# Patient Record
Sex: Female | Born: 1950 | Race: Black or African American | Hispanic: No | Marital: Married | Smoking: Never smoker
Health system: Southern US, Community
[De-identification: ages and names within clinical notes are randomized; demographics above are authoritative.]

## PROBLEM LIST (undated history)

## (undated) ENCOUNTER — Inpatient Hospital Stay: Admission: EM | Payer: Self-pay | Source: Home / Self Care

## (undated) DIAGNOSIS — R002 Palpitations: Secondary | ICD-10-CM

## (undated) DIAGNOSIS — M109 Gout, unspecified: Secondary | ICD-10-CM

## (undated) DIAGNOSIS — G4700 Insomnia, unspecified: Secondary | ICD-10-CM

## (undated) DIAGNOSIS — N189 Chronic kidney disease, unspecified: Secondary | ICD-10-CM

## (undated) DIAGNOSIS — E785 Hyperlipidemia, unspecified: Secondary | ICD-10-CM

## (undated) DIAGNOSIS — I10 Essential (primary) hypertension: Secondary | ICD-10-CM

## (undated) DIAGNOSIS — T7840XA Allergy, unspecified, initial encounter: Secondary | ICD-10-CM

## (undated) DIAGNOSIS — G4730 Sleep apnea, unspecified: Secondary | ICD-10-CM

## (undated) DIAGNOSIS — F32A Depression, unspecified: Secondary | ICD-10-CM

## (undated) DIAGNOSIS — M797 Fibromyalgia: Secondary | ICD-10-CM

## (undated) DIAGNOSIS — M1990 Unspecified osteoarthritis, unspecified site: Secondary | ICD-10-CM

## (undated) HISTORY — DX: Unspecified osteoarthritis, unspecified site: M19.90

## (undated) HISTORY — DX: Morbid (severe) obesity due to excess calories: E66.01

## (undated) HISTORY — PX: OTHER SURGICAL HISTORY: SHX169

## (undated) HISTORY — PX: ROTATOR CUFF REPAIR: SHX139

## (undated) HISTORY — DX: Hyperlipidemia, unspecified: E78.5

## (undated) HISTORY — PX: KNEE ARTHROSCOPY: SUR90

## (undated) HISTORY — DX: Allergy, unspecified, initial encounter: T78.40XA

## (undated) HISTORY — DX: Chronic kidney disease, unspecified: N18.9

## (undated) HISTORY — DX: Gout, unspecified: M10.9

## (undated) HISTORY — DX: Insomnia, unspecified: G47.00

## (undated) HISTORY — DX: Fibromyalgia: M79.7

## (undated) HISTORY — PX: TUBAL LIGATION: SHX77

## (undated) HISTORY — DX: Depression, unspecified: F32.A

## (undated) HISTORY — DX: Essential (primary) hypertension: I10

## (undated) HISTORY — DX: Palpitations: R00.2

## (undated) HISTORY — PX: JOINT REPLACEMENT: SHX530

---

## 2012-11-12 ENCOUNTER — Other Ambulatory Visit: Payer: Self-pay | Admitting: Internal Medicine

## 2012-11-12 ENCOUNTER — Ambulatory Visit (HOSPITAL_COMMUNITY)
Admission: RE | Admit: 2012-11-12 | Discharge: 2012-11-12 | Disposition: A | Discharge status: Home / Self Care | Payer: BC Managed Care – PPO | Source: Ambulatory Visit | Attending: Internal Medicine | Admitting: Internal Medicine

## 2012-11-12 DIAGNOSIS — M25512 Pain in left shoulder: Secondary | ICD-10-CM

## 2012-11-12 DIAGNOSIS — M25519 Pain in unspecified shoulder: Secondary | ICD-10-CM | POA: Insufficient documentation

## 2012-11-12 DIAGNOSIS — M47812 Spondylosis without myelopathy or radiculopathy, cervical region: Secondary | ICD-10-CM | POA: Insufficient documentation

## 2012-11-12 DIAGNOSIS — I658 Occlusion and stenosis of other precerebral arteries: Secondary | ICD-10-CM | POA: Insufficient documentation

## 2012-11-12 DIAGNOSIS — I6529 Occlusion and stenosis of unspecified carotid artery: Secondary | ICD-10-CM | POA: Insufficient documentation
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
## 2014-04-22 DIAGNOSIS — J3089 Other allergic rhinitis: Secondary | ICD-10-CM | POA: Insufficient documentation

## 2014-04-22 DIAGNOSIS — R002 Palpitations: Secondary | ICD-10-CM

## 2014-04-22 DIAGNOSIS — E876 Hypokalemia: Secondary | ICD-10-CM | POA: Insufficient documentation

## 2014-04-22 HISTORY — DX: Palpitations: R00.2

## 2014-04-23 ENCOUNTER — Telehealth: Payer: Self-pay | Admitting: Cardiology

## 2014-04-23 NOTE — Telephone Encounter (Signed)
Received records from [facility] for appointment on 05/17/14 with Dr Percival Spanish.  Records given to [facility] (medical records) for Dr Hochrein's schedule on 05/17/14. lp

## 2014-05-17 ENCOUNTER — Ambulatory Visit (INDEPENDENT_AMBULATORY_CARE_PROVIDER_SITE_OTHER): Payer: BC Managed Care – PPO | Admitting: Cardiology

## 2014-05-17 ENCOUNTER — Encounter: Payer: Self-pay | Admitting: Cardiology

## 2014-05-17 VITALS — BP 146/78 | HR 64 | Ht 62.0 in | Wt 208.0 lb

## 2014-05-17 DIAGNOSIS — R0789 Other chest pain: Secondary | ICD-10-CM

## 2014-05-17 MED ORDER — DILTIAZEM HCL ER COATED BEADS 240 MG PO CP24: 240.0000 mg | ORAL_CAPSULE | Freq: Every day | ORAL | Status: DC

## 2014-05-17 NOTE — Patient Instructions (Addendum)
DECREASE your Cardizem to 240 mg daily. You can finish your Rx that you just purchased then switch.  Your physician recommends that you schedule a follow-up appointment in: as needed with Dr. Percival Spanish

## 2014-05-17 NOTE — Progress Notes (Signed)
Cardiology Office Note   Date:  05/17/2014   ID:  Amy Hooper, DOB [birth year], MRN [id number]  PCP:  No primary care provider on file.  Cardiologist:   Minus Breeding, MD   Chief Complaint  Patient presents with  . Palpitations      History of Present Illness: Amy Hooper is a 64 y.o. female who presents for presents for evaluation of palpitations.  She has a history of these and was treated with Cardizem.  However, at that time there was much stress in her life.  Over the years this has improved.  She does not get these palpitations any longer.  The patient denies any new symptoms such as chest discomfort, neck or arm discomfort. There has been no new shortness of breath, PND or orthopnea. There have been no reported palpitations, presyncope or syncope.  She had one episode of chest discomfort that she thought was probably related to eating something recently. She comes today because she wants to come off of her medications if possible.   Past Medical History  Diagnosis Date  . Osteoarthritis   . HTN (hypertension)   . Palpitation     Past Surgical History  Procedure Laterality Date  . Rotator cuff repair Left   . Tubal ligation      No current outpatient prescriptions on file.   No current facility-administered medications for this visit.    Allergies:   Review of patient's allergies indicates not on file.    Social History:  The patient  reports that she has never smoked. She does not have any smokeless tobacco history on file.   Family History:  The patient's family history includes Hyperlipidemia in her other; Hypothyroidism in her mother; Kidney failure in her father.    ROS:  Please see the history of present illness.   Otherwise, review of systems are positive for positive for reflux..   All other systems are reviewed and negative.    PHYSICAL EXAM: VS:  BP 146/78 mmHg  Pulse 64  Ht 5\' 2"  (1.575 m)  Wt 208 lb (94.348 kg)  BMI 38.03 kg/m2 , BMI  Body mass index is 38.03 kg/(m^2). GENERAL:  Well appearing HEENT:  Pupils equal round and reactive, fundi not visualized, oral mucosa unremarkable NECK:  No jugular venous distention, waveform within normal limits, carotid upstroke brisk and symmetric, no bruits, no thyromegaly LYMPHATICS:  No cervical, inguinal adenopathy LUNGS:  Clear to auscultation bilaterally BACK:  No CVA tenderness CHEST:  Unremarkable HEART:  PMI not displaced or sustained,S1 and S2 within normal limits, no S3, no S4, no clicks, no rubs, no murmurs ABD:  Flat, positive bowel sounds normal in frequency in pitch, no bruits, no rebound, no guarding, no midline pulsatile mass, no hepatomegaly, no splenomegaly EXT:  2 plus pulses throughout, no edema, no cyanosis no clubbing SKIN:  No rashes no nodules NEURO:  Cranial nerves II through XII grossly intact, motor grossly intact throughout PSYCH:  Cognitively intact, oriented to person place and time    EKG:  EKG is ordered today. The ekg ordered today demonstrates sinus rhythm, rate 64, axis within normal limits, intervals within normal limits, premature atrial contraction, no acute ST-T wave changes.   Recent Labs: No results found for requested labs within last 365 days.    Lipid Panel No results found for: CHOL, TRIG, HDL, CHOLHDL, VLDL, LDLCALC, LDLDIRECT    Wt Readings from Last 3 Encounters:  05/17/14 208 lb (94.348 kg)  Other studies Reviewed: Additional studies/ records that were reviewed today include: Outside office records. Review of the above records demonstrates:  Please see elsewhere in the note.     ASSESSMENT AND PLAN:  PALPITATION: She does not need to take the Cardizem for palpitations. However, she probably does need this for blood pressure. I'm going to reduce to 240 mg her dose. However, her blood pressure will be handled as below.  HTN:  She needs to keep an eye on her blood pressure as we reduce the Cardizem dose. She might  eventually come off of the medications if she can increase her walking, lose weight and reduce her salt.   Current medicines are reviewed at length with the patient today.  The patient does not have concerns regarding medicines.  The following changes have been made:  no change  Labs/ tests ordered today include:   Orders Placed This Encounter  Procedures  . EKG 12-Lead     Disposition:   FU with me as needed.     Signed, Minus Breeding, MD  05/17/2014 8:38 AM    [facility]

## 2017-12-05 ENCOUNTER — Ambulatory Visit
Admission: RE | Admit: 2017-12-05 | Discharge: 2017-12-05 | Disposition: A | Discharge status: Home / Self Care | Payer: BLUE CROSS/BLUE SHIELD | Source: Ambulatory Visit | Attending: Physician Assistant | Admitting: Physician Assistant

## 2017-12-05 ENCOUNTER — Other Ambulatory Visit: Payer: Self-pay | Admitting: Physician Assistant

## 2017-12-05 DIAGNOSIS — M25511 Pain in right shoulder: Secondary | ICD-10-CM

## 2017-12-05 IMAGING — CR DG SHOULDER 2+V*R*
3 series · 3 of 3 positions shown · non-contrast
Comparison: None

CLINICAL DATA: Right shoulder pain for 6 months.

EXAM:
RIGHT SHOULDER - 2+ VIEW

[w shoulder grashey right]
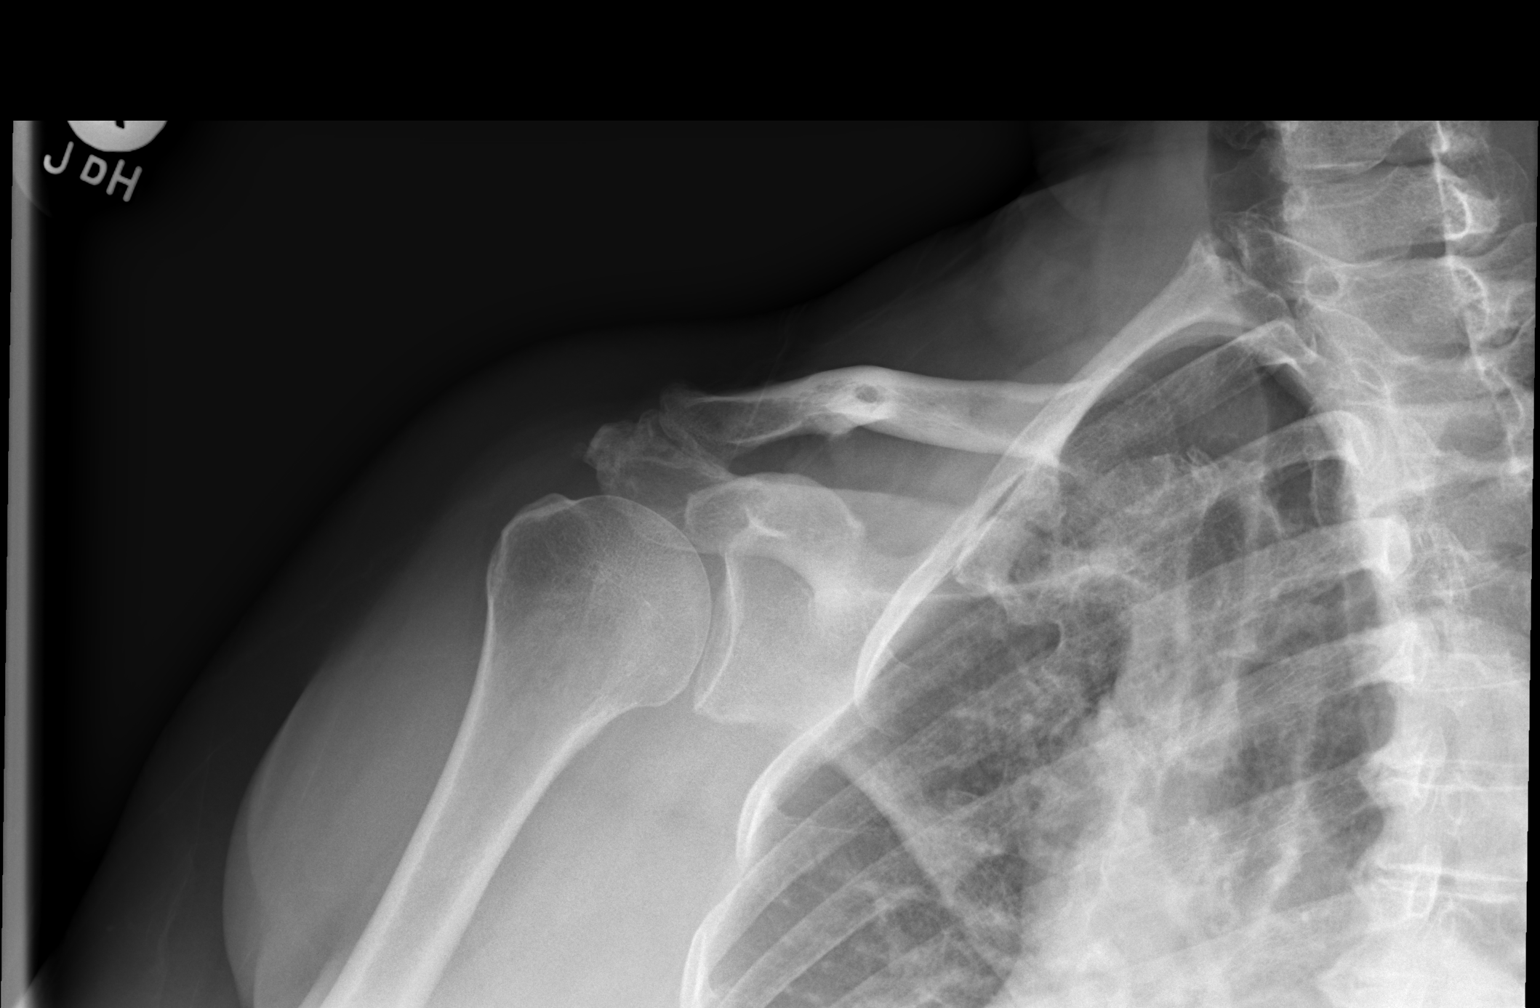

[w shoulder y-view right]
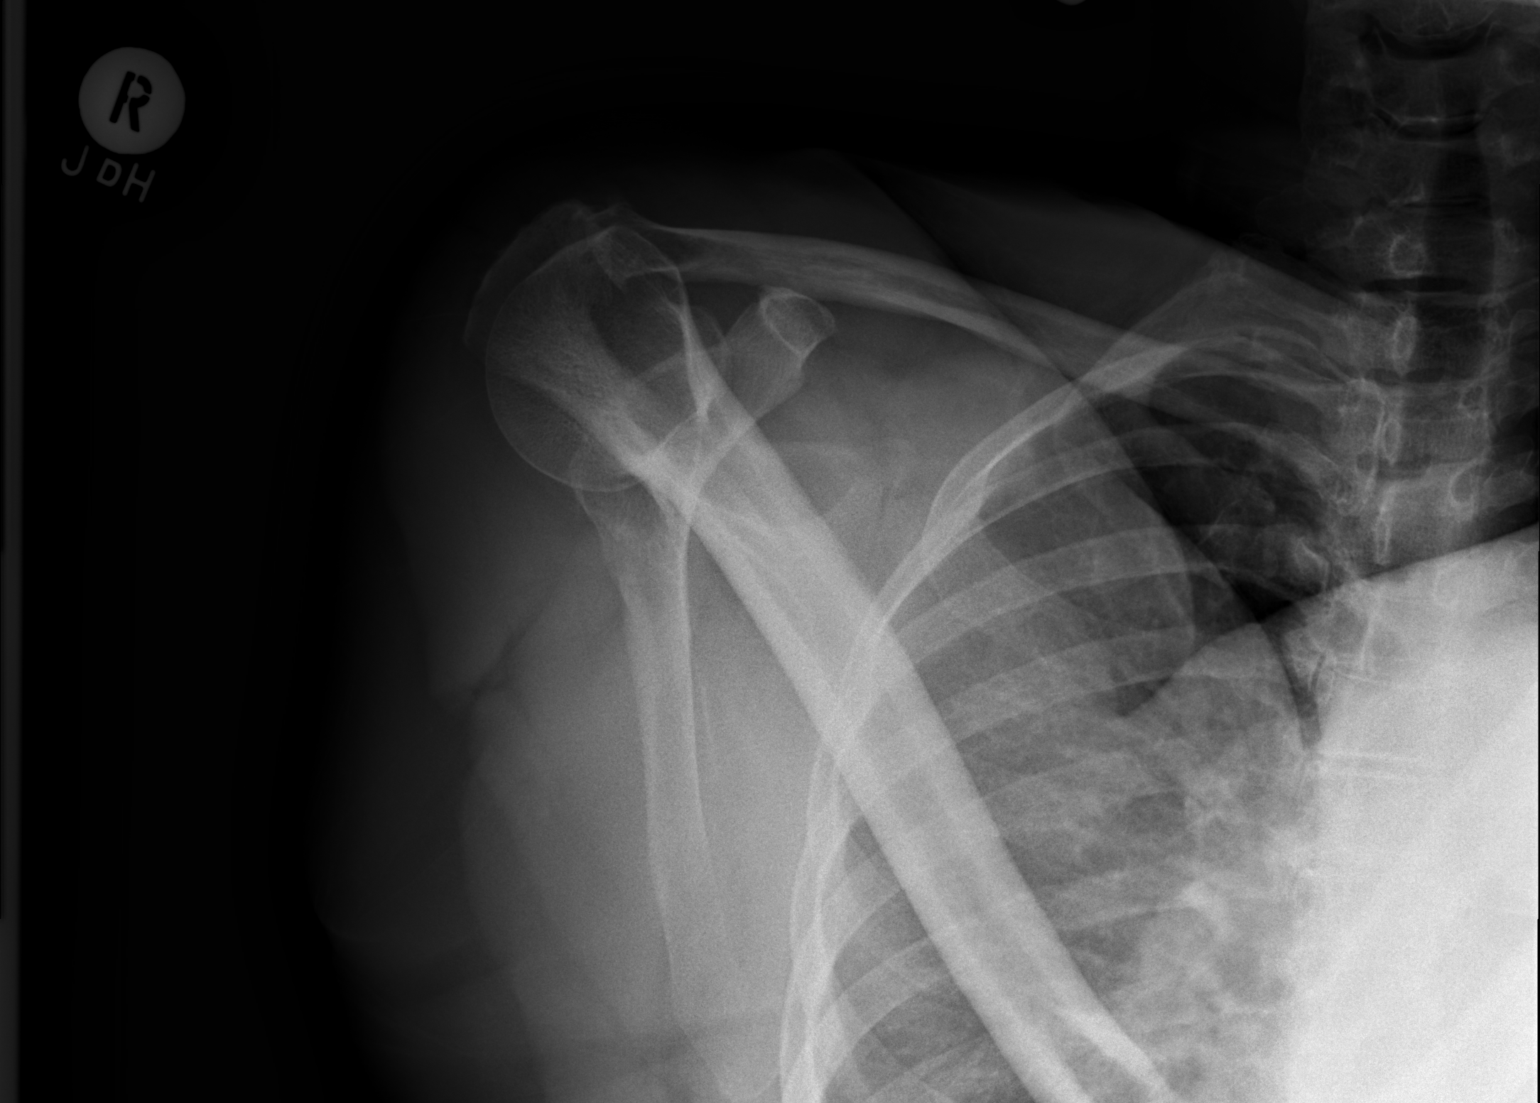

[w shoulder axillary right]
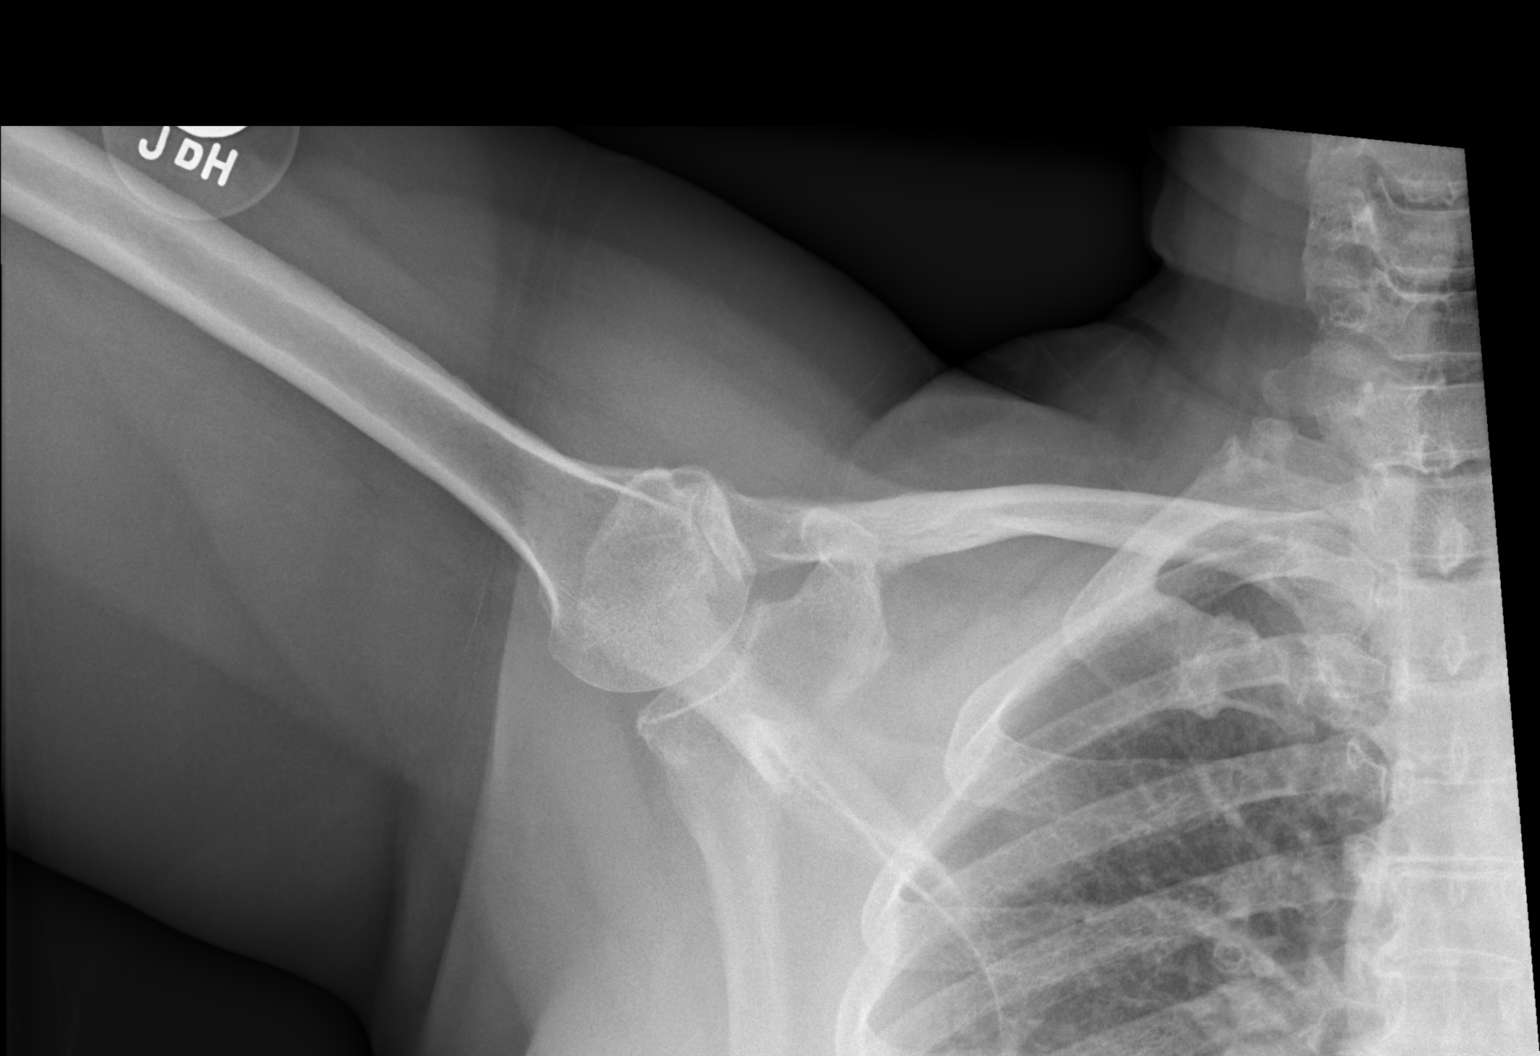

[3 of 3 positions shown; findings below may reference images not displayed]

FINDINGS: There are marked degenerative changes involving the
acromioclavicular joint. The glenohumeral joint appears normal. No
fractures or dislocations identified.
IMPRESSION: 1. AC joint osteoarthritis.

## 2018-12-27 ENCOUNTER — Ambulatory Visit
Admission: RE | Admit: 2018-12-27 | Discharge: 2018-12-27 | Disposition: A | Discharge status: Home / Self Care | Payer: BC Managed Care – PPO | Source: Ambulatory Visit | Attending: Physician Assistant | Admitting: Physician Assistant

## 2018-12-27 ENCOUNTER — Other Ambulatory Visit: Payer: Self-pay | Admitting: Physician Assistant

## 2018-12-27 DIAGNOSIS — M79662 Pain in left lower leg: Secondary | ICD-10-CM

## 2018-12-27 IMAGING — CR DG TIBIA/FIBULA 2V*L*
4 series · 4 of 4 positions shown · non-contrast
Comparison: None.

CLINICAL DATA: Left shin pain, no known injury

EXAM:
LEFT TIBIA AND FIBULA - 2 VIEW

[x tib-fib ap left (1 of 2)]
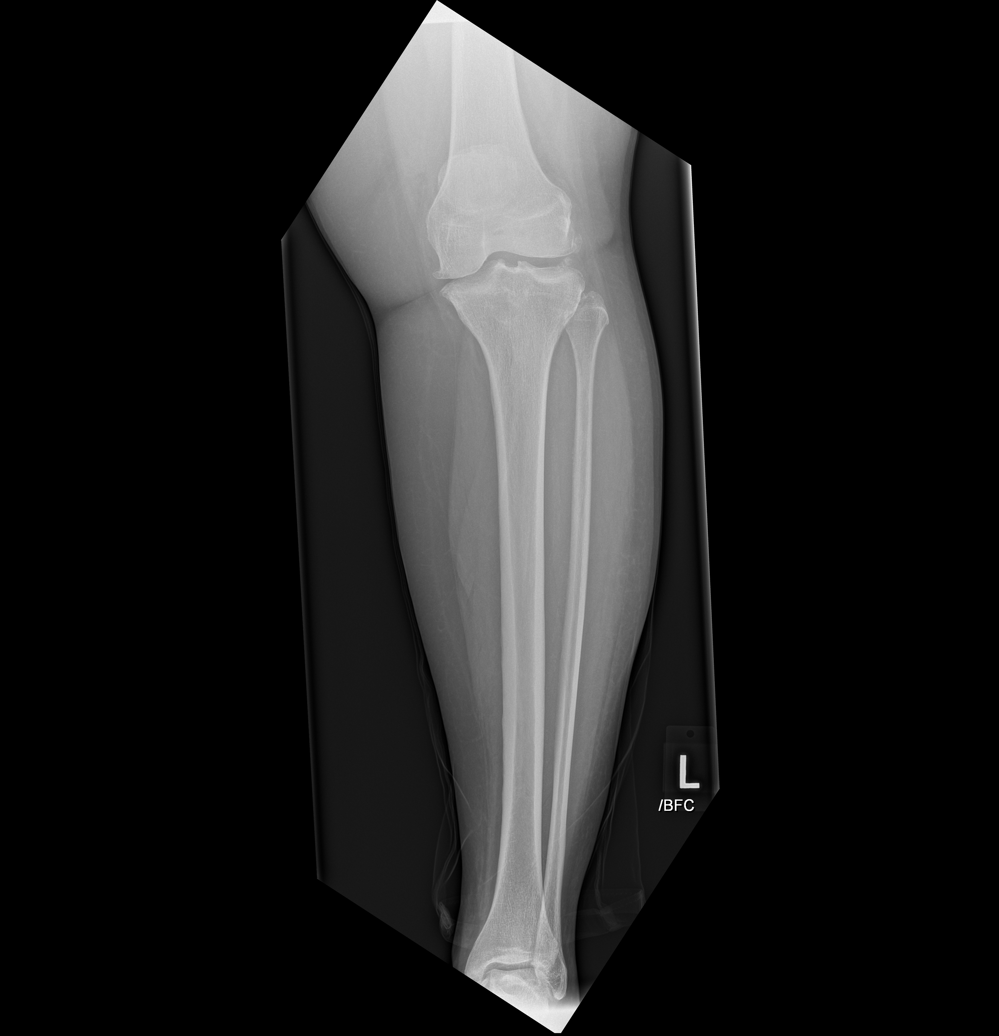

[x tib-fib ap left (2 of 2)]
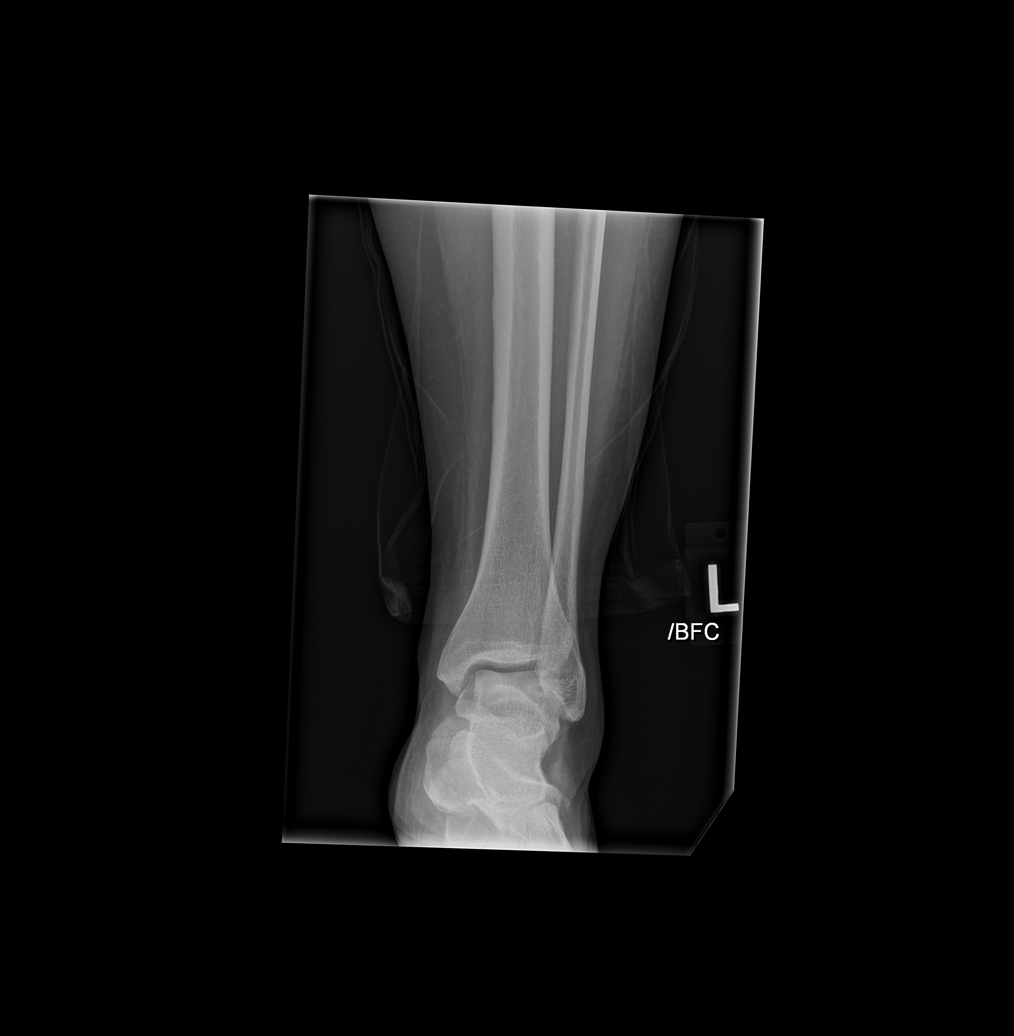

[x tib-fib lat left (1 of 2)]
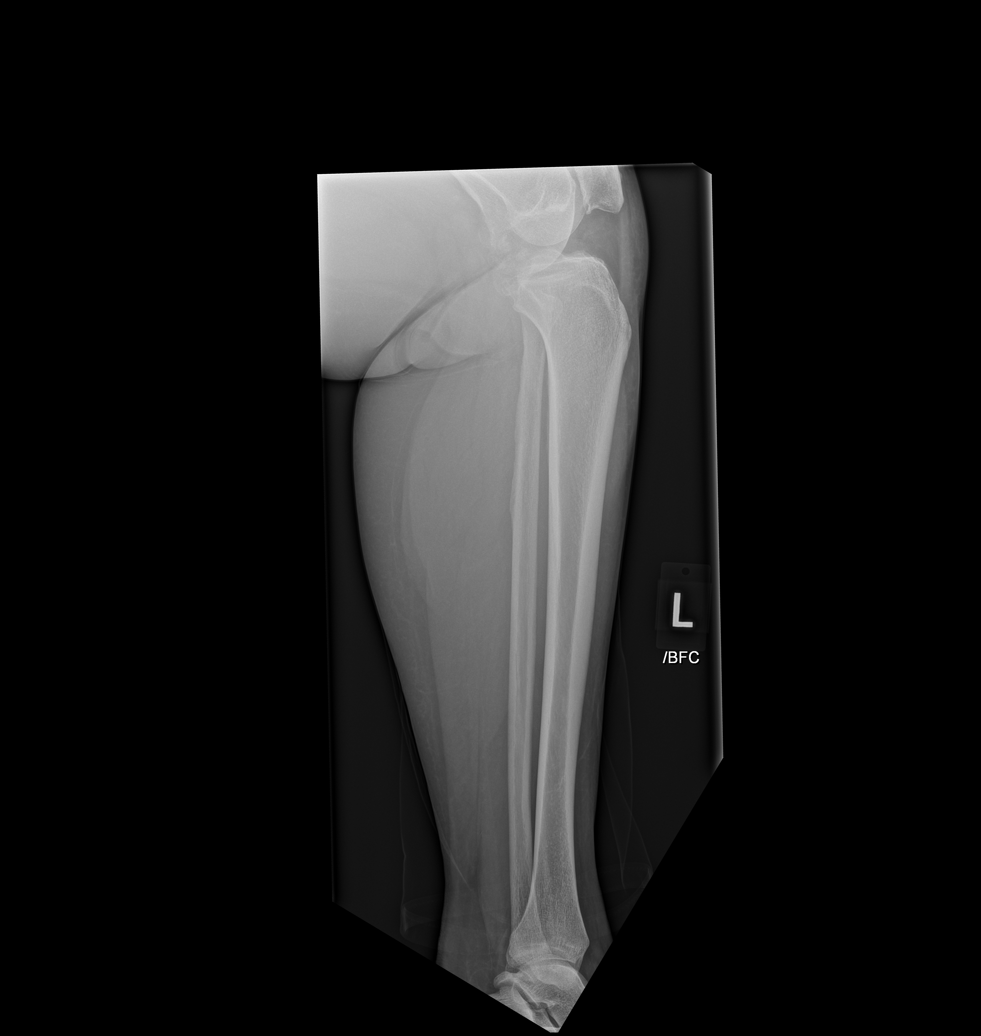

[x tib-fib lat left (2 of 2)]
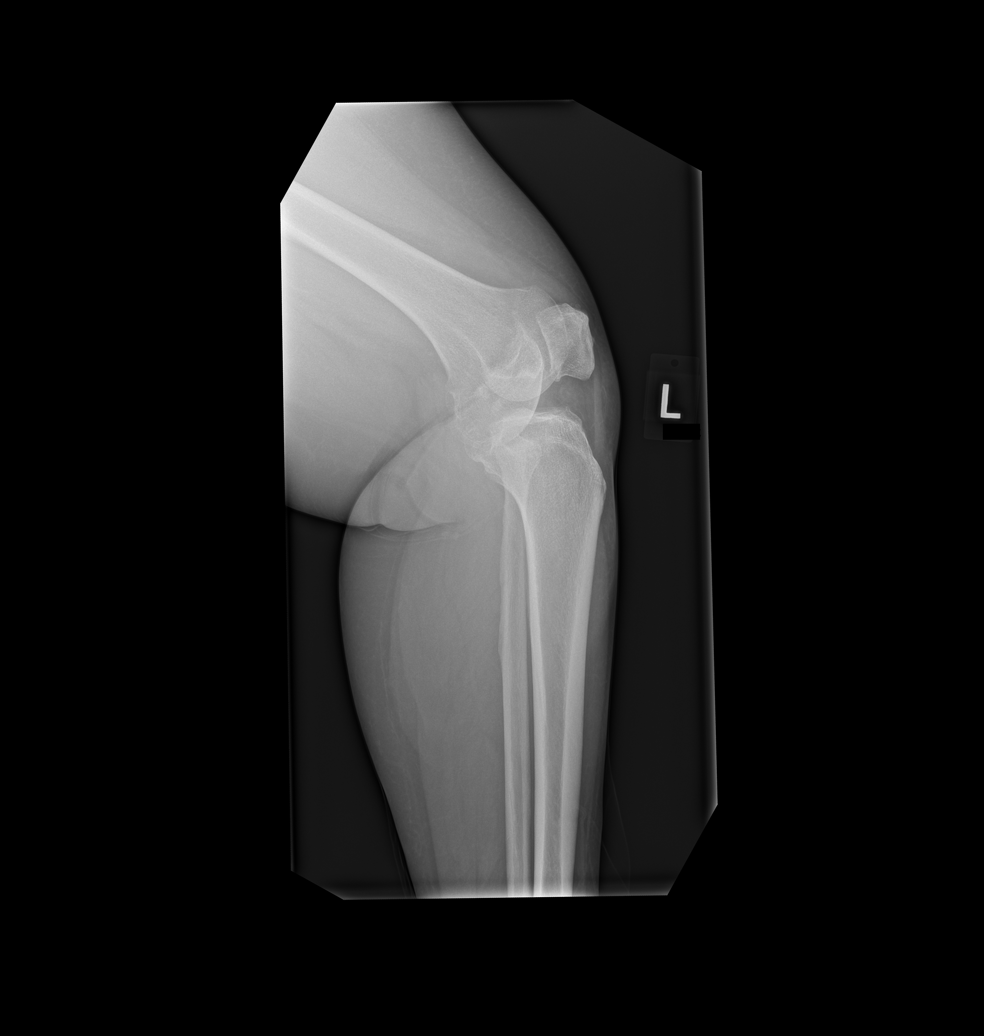

[4 of 4 positions shown; findings below may reference images not displayed]

FINDINGS: No fracture or dislocation of the left tibia or fibula. There is
moderate arthrosis of the partially included left knee. The ankle is
unremarkable. Soft tissues are unremarkable.
IMPRESSION: No fracture or dislocation of the left tibia or fibula. No
radiographic findings to explain pain.

## 2019-09-25 ENCOUNTER — Encounter: Payer: Self-pay | Admitting: Rehabilitative and Restorative Service Providers"

## 2019-09-25 ENCOUNTER — Other Ambulatory Visit: Payer: Self-pay

## 2019-09-25 ENCOUNTER — Ambulatory Visit: Payer: No Typology Code available for payment source | Admitting: Rehabilitative and Restorative Service Providers"

## 2019-09-25 DIAGNOSIS — R293 Abnormal posture: Secondary | ICD-10-CM

## 2019-09-25 DIAGNOSIS — R600 Localized edema: Secondary | ICD-10-CM

## 2019-09-25 DIAGNOSIS — G8929 Other chronic pain: Secondary | ICD-10-CM

## 2019-09-25 DIAGNOSIS — M6281 Muscle weakness (generalized): Secondary | ICD-10-CM

## 2019-09-25 DIAGNOSIS — M25511 Pain in right shoulder: Secondary | ICD-10-CM | POA: Diagnosis not present

## 2019-09-25 NOTE — Patient Instructions (Signed)
Access Code: [email or web address] URL: [email or web address] Date: 09/25/2019 Prepared by: Scot Jun  Exercises Supine Shoulder Flexion Extension AAROM with Dowel - 2 x daily - 7 x weekly - 10 reps - 3 sets - 5 hold Supine Shoulder External Rotation with Dowel - 2 x daily - 7 x weekly - 3 sets - 10 reps - 5 hold Seated Scapular Retraction - 2 x daily - 7 x weekly - 10 reps - 2 sets - 5 hold

## 2019-09-25 NOTE — Therapy (Signed)
Amy Hooper, [place], [address] Phone: [phone]   Fax:  [phone]  Physical Therapy Evaluation  Patient Details  Name: Amy Hooper MRN: [id number] Date of Birth: [birth year] Referring Provider (PT): Dr. Berenice Primas   Encounter Date: 09/25/2019   PT End of Session - 09/25/19 0932    Visit Number 1    Number of Visits 12    Date for PT Re-Evaluation 11/06/19    Progress Note Due on Visit 10    PT Start Time 0933    PT Stop Time 1013    PT Time Calculation (min) 40 min    Activity Tolerance Patient tolerated treatment well    Behavior During Therapy [facility] for tasks assessed/performed           Past Medical History:  Diagnosis Date  . HTN (hypertension)   . Osteoarthritis   . Palpitation     Past Surgical History:  Procedure Laterality Date  . ROTATOR CUFF REPAIR Left   . TUBAL LIGATION      There were no vitals filed for this visit.    Subjective Assessment - 09/25/19 0938    Subjective Amy Hooper is s/p Rt shoulder arthroscopy.  Pt. indicated symptoms had insidious onset that led her to seek medical care.  Initially had steriod orally last year with some improvement but symptoms returned worse.  Saw Dr. Berenice Primas, had injection, then had MRI performed.  Arthrscopic surgery was performed Aug 15, 2019.  Pt. indicated physical therapy was performed in June at another clinic prior to insurance change recently.    Pertinent History History of Lt shoulder surgery several years ago.    Diagnostic tests MRI    Patient Stated Goals Reduce pain    Currently in Pain? Yes    Pain Score 6    at worst   Pain Location Shoulder    Pain Orientation Right   anterior, superior Rt shoulder   Pain Descriptors / Indicators Aching;Tightness    Pain Type Surgical pain;Chronic pain    Pain Onset More than a month ago    Pain Frequency Intermittent    Aggravating Factors  reaching, end range movements, night time pain    Pain  Relieving Factors rest, wearing sling, ice    Effect of Pain on Daily Activities Overhead reaching, lifting, carrying              [facility] PT Assessment - 09/25/19 0001      Assessment   Medical Diagnosis S/P Rt shoulder arthroscopy    Referring Provider (PT) Dr. Berenice Primas    Onset Date/Surgical Date 08/15/19    Hand Dominance Right      Restrictions   Weight Bearing Restrictions No      Balance Screen   Has the patient fallen in the past 6 months No    Has the patient had a decrease in activity level because of a fear of falling?  Yes   2/2 symptoms   Is the patient reluctant to leave their home because of a fear of falling?  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  will assess in future    AROM Assessment Site Shoulder    Right/Left Shoulder Left;Right      PROM   PROM Assessment Site Shoulder    Right/Left Shoulder Left;Right    Right Shoulder Flexion 120 Degrees    Right Shoulder ABduction 110 Degrees    Right Shoulder Internal Rotation 60 Degrees   measured in 45 deg abd in supine   Right Shoulder External Rotation 60 Degrees   measured in 45 deg abd in supine     Strength   Strength Assessment Site Shoulder;Elbow    Right/Left Shoulder Left;Right    Left Shoulder Flexion 5/5    Left Shoulder ABduction 5/5      Left Shoulder Internal Rotation 5/5    Left Shoulder External Rotation 5/5    Right/Left Elbow Left;Right    Right Elbow Flexion 5/5    Right Elbow Extension 5/5    Left Elbow Flexion 5/5    Left Elbow Extension 5/5      Palpation   Palpation comment Anterior jt line tightness noted Rt GH jt                      Objective measurements completed on examination: See above findings.       [facility] Adult PT Treatment/Exercise - 09/25/19 0001      Exercises   Exercises Other Exercises    Other Exercises  HEP instruction/performance c cues for techniques consisting of supine wand flexion , supine wand er (performed 15 x each), scapular retraction x 10, pendulum review      Manual Therapy   Manual therapy comments g2-g3 inferior Rt gh Jt mobs, prom                  PT Education - 09/25/19 0932    Education Details HEP, POC    Person(s) Educated Patient    Methods Explanation;Verbal cues;Handout    Comprehension Returned demonstration;Verbalized understanding               PT Long Term Goals - 09/25/19 1016      PT LONG TERM GOAL #1   Title Patient will demonstrate independent use of home exercise program to facilitate ability to maintain/progress functional gains from skilled physical therapy services.    Status New    Target Date 11/06/19      PT LONG TERM GOAL #2   Title Patient will demonstrate/report pain at worst less than or equal to 2/10 to facilitate minimal limitation in daily activity secondary to pain symptoms.    Status New    Target Date 11/06/19      PT LONG TERM GOAL #3   Title Patient will demonstrate Rt [place] joint mobility WFL to facilitate usual self care, dressing, reaching overhead at PLOF s limitation due to symptoms.    Status New    Target Date 11/06/19      PT LONG TERM GOAL #4   Title Patient will demonstrate Rt UE MMT 5/5 throughout to facilitate usual lifting, carrying in functional activity to PLOF s limitation.  Target Date 11/06/19      PT LONG TERM GOAL #5   Title Pt. will demonstrate postural s deviation to facilitate reduced strain on Rt shoulder during daily work activity.    Target Date 11/06/19                  Plan - 09/25/19 1001    Clinical Impression Statement Patient is a 69 y.o. female who comes to clinic with complaints of Rt shoulder pain with mobility, strength and movement coordination deficits that impair their ability to perform usual daily and recreational functional activities without increase difficulty/symptoms at this time.  Patient to benefit from skilled PT services to address impairments and limitations to improve to previous level of function without restriction secondary to condition.    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)   Stability/Clinical Decision Making Stable/Uncomplicated    Clinical Decision Making Low    Rehab Potential Good    PT Frequency 2x / week    PT Duration 6 weeks    PT Treatment/Interventions ADLs/Self Care Home Management;Electrical Stimulation;Iontophoresis 4mg /ml Dexamethasone;Moist Heat;Traction;Balance training;Therapeutic exercise;Therapeutic activities;Functional mobility training;Gait training;Ultrasound;Neuromuscular re-education;Patient/family education;Manual techniques;Vasopneumatic Device;Taping;Passive range of motion;Dry needling;Spinal Manipulations;Joint Manipulations    PT Next Visit Plan Reassess HEP, progress mobility as tolerated and initiate strengthening as appropriate by surgical protocol (6 weeks tomorrow).    PT Home Exercise Plan [email or web address]    Consulted and Agree with Plan of Care Patient           Patient will benefit from skilled therapeutic intervention in order to improve the following  deficits and impairments:  Decreased endurance, Hypomobility, Increased edema, Decreased activity tolerance, Decreased strength, Impaired UE functional use, Pain, Decreased mobility, Decreased range of motion, Postural dysfunction, Impaired flexibility, Decreased coordination  Visit Diagnosis: Chronic right shoulder pain  Muscle weakness (generalized)  Abnormal posture  Localized edema     Problem List There are no problems to display for this patient.   Scot Jun, PT, DPT, OCS, ATC 09/25/19  10:19 AM     [facility] [address] [place], [place], [address] Phone: [phone]   Fax:  [phone]  Name: Amy Hooper MRN: [id number] Date of Birth: 29-Nov-1950

## 2019-10-03 ENCOUNTER — Ambulatory Visit (INDEPENDENT_AMBULATORY_CARE_PROVIDER_SITE_OTHER): Payer: No Typology Code available for payment source | Admitting: Physical Therapy

## 2019-10-03 ENCOUNTER — Other Ambulatory Visit: Payer: Self-pay

## 2019-10-03 ENCOUNTER — Encounter: Payer: Self-pay | Admitting: Physical Therapy

## 2019-10-03 DIAGNOSIS — R600 Localized edema: Secondary | ICD-10-CM

## 2019-10-03 DIAGNOSIS — M25511 Pain in right shoulder: Secondary | ICD-10-CM

## 2019-10-03 DIAGNOSIS — M6281 Muscle weakness (generalized): Secondary | ICD-10-CM | POA: Diagnosis not present

## 2019-10-03 DIAGNOSIS — R293 Abnormal posture: Secondary | ICD-10-CM | POA: Diagnosis not present

## 2019-10-03 DIAGNOSIS — G8929 Other chronic pain: Secondary | ICD-10-CM

## 2019-10-03 NOTE — Therapy (Signed)
[place], [place], [address] Phone: [phone]   Fax:  [phone]  Physical Therapy Treatment  Patient Details  Name: Amy Hooper MRN: [id number] Date of Birth: 01/02/1951 Referring Provider (PT): Dr. Berenice Primas   Encounter Date: 10/03/2019   PT End of Session - 10/03/19 1001    Visit Number 2    Number of Visits 12    Date for PT Re-Evaluation 11/06/19    Progress Note Due on Visit 10    PT Start Time 0930    PT Stop Time 1025    PT Time Calculation (min) 55 min    Activity Tolerance Patient tolerated treatment well    Behavior During Therapy [facility] for tasks assessed/performed           Past Medical History:  Diagnosis Date  . HTN (hypertension)   . Osteoarthritis   . Palpitation     Past Surgical History:  Procedure Laterality Date  . ROTATOR CUFF REPAIR Left   . TUBAL LIGATION      There were no vitals filed for this visit.   Subjective Assessment - 10/03/19 0939    Subjective relays about 8/10 pain today in her Rt shoulder but she has also discontinued her pain medicine getting off the narcotics and swithing over to OTC    Pertinent History History of Lt shoulder surgery several years ago.    Diagnostic tests MRI    Patient Stated Goals Reduce pain    Pain Onset More than a month ago              [facility] PT Assessment - 10/03/19 0001      Assessment   Medical Diagnosis S/P Rt shoulder arthroscopy, biceps tenodesis    Referring Provider (PT) Dr. Berenice Primas    Onset Date/Surgical Date 08/15/19    Hand Dominance Right    Next MD Visit 10/09/19                         [facility] Adult PT Treatment/Exercise - 10/03/19 0001      Exercises   Exercises Shoulder      Shoulder Exercises: Seated   Other Seated Exercises table slides AAROM for Rt shoulder flexion, abduction, ER 10 reps ea with 5 sec holds      Shoulder Exercises: Pulleys   Flexion 3 minutes    ABduction 3 minutes        Shoulder Exercises: ROM/Strengthening   UBE (Upper Arm Bike) 2 min fwd, 2 min retro no resistance      Modalities   Modalities Electrical Stimulation;Vasopneumatic      Electrical Stimulation   Electrical Stimulation Location Rt shoulder, 10 min    Electrical Stimulation Action IFC    Electrical Stimulation Parameters tolerance    Electrical Stimulation Goals Pain      Vasopneumatic   Number Minutes Vasopneumatic  10 minutes    Vasopnuematic Location  Shoulder    Vasopneumatic Pressure Medium    Vasopneumatic Temperature  34      Manual Therapy   Manual therapy comments g2-g3 inferior Rt gh Jt mobs, prom                       PT Long Term Goals - 09/25/19 1016      PT LONG TERM GOAL #1   Title Patient will demonstrate independent use of home exercise program to facilitate ability to maintain/progress functional gains from  skilled physical therapy services.    Status New    Target Date 11/06/19      PT LONG TERM GOAL #2   Title Patient will demonstrate/report pain at worst less than or equal to 2/10 to facilitate minimal limitation in daily activity secondary to pain symptoms.    Status New    Target Date 11/06/19      PT LONG TERM GOAL #3   Title Patient will demonstrate Rt [place] joint mobility WFL to facilitate usual self care, dressing, reaching overhead at PLOF s limitation due to symptoms.    Status New    Target Date 11/06/19      PT LONG TERM GOAL #4   Title Patient will demonstrate Rt UE MMT 5/5 throughout to facilitate usual lifting, carrying in functional activity to PLOF s limitation.    Target Date 11/06/19      PT LONG TERM GOAL #5   Title Pt. will demonstrate postural s deviation to facilitate reduced strain on Rt shoulder during daily work activity.    Target Date 11/06/19                 Plan - 10/03/19 1002    Clinical Impression Statement Session focused on Rt shoulder AAROM and PROM to tolerance to improve her overall ROM  which is doing fairly well post op. She is doing her HEP and had no questions or concerns with it. She will follow up with MD on 10/09/19 so will neeed MD progress note next visit. Used vaso and TENS post tx to reduce overall pain, inflammation, and edema.    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)   Stability/Clinical Decision Making Stable/Uncomplicated    Rehab Potential Good    PT Frequency 2x / week    PT Duration 6 weeks    PT Treatment/Interventions ADLs/Self Care Home Management;Electrical Stimulation;Iontophoresis 4mg /ml Dexamethasone;Moist Heat;Traction;Balance training;Therapeutic exercise;Therapeutic activities;Functional mobility training;Gait training;Ultrasound;Neuromuscular re-education;Patient/family education;Manual techniques;Vasopneumatic Device;Taping;Passive range of motion;Dry needling;Spinal Manipulations;Joint Manipulations    PT Next Visit Plan progress mobility as tolerated and initiate strengthening as appropriate now that she is 6 weeks post op    PT Home Exercise Plan [email or web address]    Consulted and Agree with Plan of Care Patient           Patient will benefit from skilled therapeutic intervention in order to improve the following deficits and impairments:  Decreased endurance, Hypomobility, Increased edema, Decreased activity tolerance, Decreased strength, Impaired UE functional use, Pain, Decreased mobility, Decreased range of motion, Postural dysfunction, Impaired flexibility, Decreased coordination  Visit Diagnosis: Chronic right shoulder pain  Muscle weakness (generalized)  Abnormal posture  Localized edema     Problem List There are no problems to display for this patient.   Debbe Odea, PT,DPT 10/03/2019, 10:32 AM  [facility] [address] [place], [place], [address] Phone: [phone]   Fax:  [phone]  Name: Amy Hooper MRN: [id number] Date of Birth: June 03, 1950

## 2019-10-05 ENCOUNTER — Telehealth: Payer: Self-pay | Admitting: Rehabilitative and Restorative Service Providers"

## 2019-10-05 ENCOUNTER — Encounter: Payer: No Typology Code available for payment source | Admitting: Rehabilitative and Restorative Service Providers"

## 2019-10-05 NOTE — Telephone Encounter (Signed)
Called Pt. After 15 mins no show. No answer.  LVM regarding missed appointment and next appointment time as well as clinic phone number.  Scot Jun, PT, DPT, OCS, ATC 10/05/19  12:07 PM

## 2019-10-17 ENCOUNTER — Other Ambulatory Visit: Payer: Self-pay

## 2019-10-17 ENCOUNTER — Ambulatory Visit (INDEPENDENT_AMBULATORY_CARE_PROVIDER_SITE_OTHER): Payer: No Typology Code available for payment source | Admitting: Physical Therapy

## 2019-10-17 DIAGNOSIS — R600 Localized edema: Secondary | ICD-10-CM

## 2019-10-17 DIAGNOSIS — M6281 Muscle weakness (generalized): Secondary | ICD-10-CM

## 2019-10-17 DIAGNOSIS — R293 Abnormal posture: Secondary | ICD-10-CM

## 2019-10-17 DIAGNOSIS — M25511 Pain in right shoulder: Secondary | ICD-10-CM | POA: Diagnosis not present

## 2019-10-17 DIAGNOSIS — G8929 Other chronic pain: Secondary | ICD-10-CM

## 2019-10-17 NOTE — Therapy (Signed)
[facility] [place], [place], [address] Phone: [phone]   Fax:  [phone]  Physical Therapy Treatment  Patient Details  Name: Amy Hooper MRN: [id number] Date of Birth: 1950/10/29 Referring Provider (PT): Dr. Berenice Primas   Encounter Date: 10/17/2019   PT End of Session - 10/17/19 1008    Visit Number 3    Number of Visits 12    Date for PT Re-Evaluation 11/06/19    Progress Note Due on Visit 10    PT Start Time 0930    PT Stop Time 1018    PT Time Calculation (min) 48 min    Activity Tolerance Patient tolerated treatment well    Behavior During Therapy [facility] for tasks assessed/performed           Past Medical History:  Diagnosis Date  . HTN (hypertension)   . Osteoarthritis   . Palpitation     Past Surgical History:  Procedure Laterality Date  . ROTATOR CUFF REPAIR Left   . TUBAL LIGATION      There were no vitals filed for this visit.   Subjective Assessment - 10/17/19 0941    Subjective relays her shoulder is feeling better not much pain in it today but she is having widespread pain all oer after falling back into a chair, she notes she does have fibromyalgia and wonders if this caused that to flare up.    Pertinent History History of Lt shoulder surgery several years ago.    Diagnostic tests MRI    Patient Stated Goals Reduce pain    Pain Onset More than a month ago              [facility] PT Assessment - 10/17/19 0001      Assessment   Medical Diagnosis S/P Rt shoulder arthroscopy, biceps tenodesis    Referring Provider (PT) Dr. Berenice Primas    Onset Date/Surgical Date 08/15/19      AROM   Right Shoulder Flexion 145 Degrees    Right Shoulder ABduction 100 Degrees           [facility] Adult PT Treatment/Exercise - 10/17/19 0001      Shoulder Exercises: Standing   External Rotation Right;20 reps    Theraband Level (Shoulder External Rotation) Level 2 (Red)    Internal Rotation Right;20 reps     Theraband Level (Shoulder Internal Rotation) Level 2 (Red)    Extension 20 reps    Theraband Level (Shoulder Extension) Level 2 (Red)    Row 20 reps    Theraband Level (Shoulder Row) Level 2 (Red)      Shoulder Exercises: Pulleys   Flexion --   now has pulleys at home so did not perform in clinic     Shoulder Exercises: ROM/Strengthening   UBE (Upper Arm Bike) 2 min fwd, 2 min retro L 2    Ranger X 10 reps flexion, circles, scaption    Wall Wash wall ladder X 5 reps flexion and 5 reps scaption      Electrical Stimulation   Electrical Stimulation Location Rt shoulder, 10 min    Electrical Stimulation Action IFC    Electrical Stimulation Parameters tolerance    Electrical Stimulation Goals Pain      Vasopneumatic   Number Minutes Vasopneumatic  10 minutes    Vasopnuematic Location  Shoulder    Vasopneumatic Pressure Medium    Vasopneumatic Temperature  34  PT Long Term Goals - 09/25/19 1016      PT LONG TERM GOAL #1   Title Patient will demonstrate independent use of home exercise program to facilitate ability to maintain/progress functional gains from skilled physical therapy services.    Status New    Target Date 11/06/19      PT LONG TERM GOAL #2   Title Patient will demonstrate/report pain at worst less than or equal to 2/10 to facilitate minimal limitation in daily activity secondary to pain symptoms.    Status New    Target Date 11/06/19      PT LONG TERM GOAL #3   Title Patient will demonstrate Rt [place] joint mobility WFL to facilitate usual self care, dressing, reaching overhead at PLOF s limitation due to symptoms.    Status New    Target Date 11/06/19      PT LONG TERM GOAL #4   Title Patient will demonstrate Rt UE MMT 5/5 throughout to facilitate usual lifting, carrying in functional activity to PLOF s limitation.    Target Date 11/06/19      PT LONG TERM GOAL #5   Title Pt. will demonstrate postural s deviation to facilitate  reduced strain on Rt shoulder during daily work activity.    Target Date 11/06/19                 Plan - 10/17/19 1009    Clinical Impression Statement She had noted improvements in overall shoulder AROM today. Able to progress her ROM and strength exercises today with good tolerance. Continued with vaso and TENS post tx to reduce soreness and swelling. PT will continue to progress as tolerated.    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)   Stability/Clinical Decision Making Stable/Uncomplicated    Rehab Potential Good    PT Frequency 2x / week    PT Duration 6 weeks    PT Treatment/Interventions ADLs/Self Care Home Management;Electrical Stimulation;Iontophoresis 4mg /ml Dexamethasone;Moist Heat;Traction;Balance training;Therapeutic exercise;Therapeutic activities;Functional mobility training;Gait training;Ultrasound;Neuromuscular re-education;Patient/family education;Manual techniques;Vasopneumatic Device;Taping;Passive range of motion;Dry needling;Spinal Manipulations;Joint Manipulations    PT Next Visit Plan progress mobility, ROM, strength as tolerated    PT Home Exercise Plan [id number]    Consulted and Agree with Plan of Care Patient           Patient will benefit from skilled therapeutic intervention in order to improve the following deficits and impairments:  Decreased endurance, Hypomobility, Increased edema, Decreased activity tolerance, Decreased strength, Impaired UE functional use, Pain, Decreased mobility, Decreased range of motion, Postural dysfunction, Impaired flexibility, Decreased coordination  Visit Diagnosis: Chronic right shoulder pain  Muscle weakness (generalized)  Abnormal posture  Localized edema     Problem List There are no  problems to display for this patient.   Silvestre Mesi 10/17/2019, 10:11 AM  [facility] Physical Therapy [address] [place], [place], [address] Phone: [phone]   Fax:  [phone]  Name: Amy Hooper MRN: [id number] Date of Birth: 10-03-1950

## 2019-10-19 ENCOUNTER — Other Ambulatory Visit: Payer: Self-pay

## 2019-10-19 ENCOUNTER — Encounter: Payer: Self-pay | Admitting: Physical Therapy

## 2019-10-19 ENCOUNTER — Ambulatory Visit (INDEPENDENT_AMBULATORY_CARE_PROVIDER_SITE_OTHER): Payer: No Typology Code available for payment source | Admitting: Physical Therapy

## 2019-10-19 DIAGNOSIS — R600 Localized edema: Secondary | ICD-10-CM

## 2019-10-19 DIAGNOSIS — M25511 Pain in right shoulder: Secondary | ICD-10-CM

## 2019-10-19 DIAGNOSIS — M6281 Muscle weakness (generalized): Secondary | ICD-10-CM

## 2019-10-19 DIAGNOSIS — R293 Abnormal posture: Secondary | ICD-10-CM | POA: Diagnosis not present

## 2019-10-19 DIAGNOSIS — G8929 Other chronic pain: Secondary | ICD-10-CM

## 2019-10-19 NOTE — Therapy (Signed)
[place], [place], [address] Phone: [phone]   Fax:  [phone]  Physical Therapy Treatment  Patient Details  Name: Amy Hooper MRN: [id number] Date of Birth: Jun 02, 1950 Referring Provider (PT): Dr. Berenice Primas   Encounter Date: 10/19/2019   PT End of Session - 10/19/19 1059    Visit Number 4    Number of Visits 12    Date for PT Re-Evaluation 11/06/19    Progress Note Due on Visit 10    PT Start Time 1020    PT Stop Time 1105    PT Time Calculation (min) 45 min    Activity Tolerance Patient tolerated treatment well    Behavior During Therapy [facility] for tasks assessed/performed           Past Medical History:  Diagnosis Date  . HTN (hypertension)   . Osteoarthritis   . Palpitation     Past Surgical History:  Procedure Laterality Date  . ROTATOR CUFF REPAIR Left   . TUBAL LIGATION      There were no vitals filed for this visit.   Subjective Assessment - 10/19/19 1055    Subjective relays her Rt shoulder is about 7/10 soreness after last time, also having more pain around her incision lately    Pertinent History History of Lt shoulder surgery several years ago.    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 doorway 10 sec X 10    Internal Rotation Stretch Limitations 10 sec X 10 , modified sleeper on wall      Electrical Stimulation   Electrical Stimulation Location Rt shoulder, 10 min    Electrical Stimulation Action IFC    Electrical Stimulation Parameters tolerance    Electrical  Stimulation Goals Pain      Vasopneumatic   Number Minutes Vasopneumatic  10 minutes    Vasopnuematic Location  Shoulder    Vasopneumatic Pressure Medium    Vasopneumatic Temperature  34      Manual Therapy   Manual therapy comments Rt shoulder STM/IASTM, scar massage              PT Long Term Goals - 09/25/19 1016      PT LONG TERM GOAL #1   Title Patient will demonstrate independent use of home exercise program to facilitate ability to maintain/progress functional gains from skilled physical therapy services.    Status New    Target Date 11/06/19      PT LONG TERM GOAL #2   Title Patient will demonstrate/report pain at worst less than or equal to 2/10 to facilitate minimal limitation in daily activity secondary to pain symptoms.    Status New    Target Date 11/06/19      PT LONG TERM GOAL #3   Title Patient will demonstrate Rt [place] joint mobility WFL to facilitate usual self care, dressing, reaching overhead at PLOF s limitation  due to symptoms.    Status New    Target Date 11/06/19      PT LONG TERM GOAL #4   Title Patient will demonstrate Rt UE MMT 5/5 throughout to facilitate usual lifting, carrying in functional activity to PLOF s limitation.    Target Date 11/06/19      PT LONG TERM GOAL #5   Title Pt. will demonstrate postural s deviation to facilitate reduced strain on Rt shoulder during daily work activity.    Target Date 11/06/19                 Plan - 10/19/19 1100    Clinical Impression Statement She had a lot of soreness after increased strenghtening last session thus strength exercises held today. Instead focused on gentle ROM and stretching along with manual therapy and modalaties to decrease her overall pain, inflammation, and soreness.    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)   Stability/Clinical Decision Making Stable/Uncomplicated    Rehab Potential Good    PT Frequency 2x / week    PT Duration 6 weeks    PT Treatment/Interventions ADLs/Self Care Home Management;Electrical Stimulation;Iontophoresis 4mg /ml Dexamethasone;Moist Heat;Traction;Balance training;Therapeutic exercise;Therapeutic activities;Functional mobility training;Gait training;Ultrasound;Neuromuscular re-education;Patient/family education;Manual techniques;Vasopneumatic Device;Taping;Passive range of motion;Dry needling;Spinal Manipulations;Joint Manipulations    PT Next Visit Plan progress mobility, ROM, strength as tolerated    PT Home Exercise Plan [id number]    Consulted and Agree with Plan of Care Patient           Patient will benefit from skilled therapeutic intervention in order to improve the following deficits and impairments:  Decreased endurance, Hypomobility, Increased edema, Decreased activity tolerance, Decreased strength, Impaired UE functional use, Pain, Decreased mobility, Decreased range of motion, Postural dysfunction, Impaired flexibility, Decreased coordination  Visit Diagnosis: Chronic right shoulder pain  Muscle weakness (generalized)  Abnormal posture  Localized edema     Problem List There are no problems to display for this patient.   Silvestre Mesi 10/19/2019, 11:02 AM  [facility] Physical Therapy [address] [place], [place], [address] Phone: [phone]   Fax:  [phone]  Name: Amy Hooper MRN: [id number] Date of Birth: 10-05-1950

## 2019-10-24 ENCOUNTER — Ambulatory Visit (INDEPENDENT_AMBULATORY_CARE_PROVIDER_SITE_OTHER): Payer: No Typology Code available for payment source | Admitting: Physical Therapy

## 2019-10-24 ENCOUNTER — Other Ambulatory Visit: Payer: Self-pay

## 2019-10-24 ENCOUNTER — Encounter: Payer: Self-pay | Admitting: Physical Therapy

## 2019-10-24 DIAGNOSIS — M6281 Muscle weakness (generalized): Secondary | ICD-10-CM

## 2019-10-24 DIAGNOSIS — G8929 Other chronic pain: Secondary | ICD-10-CM

## 2019-10-24 DIAGNOSIS — M25511 Pain in right shoulder: Secondary | ICD-10-CM

## 2019-10-24 DIAGNOSIS — R293 Abnormal posture: Secondary | ICD-10-CM | POA: Diagnosis not present

## 2019-10-24 DIAGNOSIS — R600 Localized edema: Secondary | ICD-10-CM

## 2019-10-24 NOTE — Therapy (Signed)
[place], [place], [address] Phone: [phone]   Fax:  [phone]  Physical Therapy Treatment  Patient Details  Name: Amy Hooper MRN: [id number] Date of Birth: 09-27-1950 Referring Provider (PT): Dr. Berenice Primas   Encounter Date: 10/24/2019   PT End of Session - 10/24/19 0846    Visit Number 5    Number of Visits 12    Date for PT Re-Evaluation 11/06/19    Progress Note Due on Visit 10    PT Start Time 0805    PT Stop Time 0850    PT Time Calculation (min) 45 min    Activity Tolerance Patient tolerated treatment well    Behavior During Therapy [facility] for tasks assessed/performed           Past Medical History:  Diagnosis Date  . HTN (hypertension)   . Osteoarthritis   . Palpitation     Past Surgical History:  Procedure Laterality Date  . ROTATOR CUFF REPAIR Left   . TUBAL LIGATION      There were no vitals filed for this visit.   Subjective Assessment - 10/24/19 0843    Subjective Relays shoulder has been staying about 6/10 pain, the rain makes it worse    Pertinent History History of Lt shoulder surgery several years ago.    Diagnostic tests MRI    Patient Stated Goals Reduce pain    Pain Onset More than a month ago                             [facility] Adult PT Treatment/Exercise - 10/24/19 0001      Shoulder Exercises: Standing   External Rotation Right;10 reps    Theraband Level (Shoulder External Rotation) Level 2 (Red)    Internal Rotation Right;10 reps    Theraband Level (Shoulder Internal Rotation) Level 2 (Red)    Extension Both;15 reps    Theraband Level (Shoulder Extension) Level 2 (Red)    Row Both;15 reps    Theraband Level (Shoulder Row) Level 2 (Red)    Other Standing Exercises Rt bicep curls 2# X 15 reps      Shoulder Exercises: Pulleys   Flexion 2 minutes    ABduction 2 minutes      Shoulder Exercises: ROM/Strengthening   Ranger X 10 reps flexion,  circles, scaption    Wall Wash wall ladder X 5 reps flexion and 5 reps scaption      Modalities   Modalities Iontophoresis      Iontophoresis   Type of Iontophoresis Dexamethasone    Location Rt posterior shoulder    Dose 1.0 cc    Time 6 hour wear home patch      Vasopneumatic   Number Minutes Vasopneumatic  10 minutes    Vasopnuematic Location  Shoulder    Vasopneumatic Pressure Medium    Vasopneumatic Temperature  34      Manual Therapy   Manual therapy comments Rt shoulder PROM all planes, gentle GH mobs A-P, P-A, inferior, and distraction                  PT Education - 10/24/19 0846    Education Details ionto rationale, instructions    Person(s) Educated Patient    Methods Explanation    Comprehension Verbalized understanding               PT Long Term Goals - 10/24/19 4010  PT LONG TERM GOAL #1   Title Patient will demonstrate independent use of home exercise program to facilitate ability to maintain/progress functional gains from skilled physical therapy services.    Status On-going      PT LONG TERM GOAL #2   Title Patient will demonstrate/report pain at worst less than or equal to 2/10 to facilitate minimal limitation in daily activity secondary to pain symptoms.    Baseline now 6/10    Status On-going      PT LONG TERM GOAL #3   Title Patient will demonstrate Rt [place] joint mobility WFL to facilitate usual self care, dressing, reaching overhead at PLOF s limitation due to symptoms.    Baseline mild restrictions now    Status On-going      PT LONG TERM GOAL #4   Title Patient will demonstrate Rt UE MMT 5/5 throughout to facilitate usual lifting, carrying in functional activity to PLOF s limitation.    Baseline 4/5 overall now    Status On-going      PT LONG TERM GOAL #5   Title Pt. will demonstrate postural s deviation to facilitate reduced strain on Rt shoulder during daily work activity.    Status On-going                 Plan  - 10/24/19 0853    Clinical Impression Statement She is continueing to have pain and inflammation in Rt shoulder to trialed Ionto today to reduce this. Encouraged her to back down on her activity some until pain decreases. PT backed down on reps today but will try to add back once pain decreaeses.    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)   Stability/Clinical Decision Making Stable/Uncomplicated    Rehab Potential Good    PT Frequency 2x / week    PT Duration 6 weeks    PT Treatment/Interventions ADLs/Self Care Home Management;Electrical Stimulation;Iontophoresis 4mg /ml Dexamethasone;Moist Heat;Traction;Balance training;Therapeutic exercise;Therapeutic activities;Functional mobility training;Gait training;Ultrasound;Neuromuscular re-education;Patient/family education;Manual techniques;Vasopneumatic Device;Taping;Passive range of motion;Dry needling;Spinal Manipulations;Joint Manipulations    PT Next Visit Plan progress mobility, ROM, strength as tolerated    PT Home Exercise Plan [id number]    Consulted and Agree with Plan of Care Patient           Patient will benefit from skilled therapeutic intervention in order to improve the following deficits and impairments:  Decreased endurance, Hypomobility, Increased edema, Decreased activity tolerance, Decreased strength, Impaired UE functional use, Pain, Decreased mobility, Decreased range of motion, Postural dysfunction, Impaired flexibility, Decreased coordination  Visit Diagnosis: Chronic right shoulder pain  Muscle weakness (generalized)  Abnormal posture  Localized edema     Problem List There are no problems to display for this patient.   Silvestre Mesi 10/24/2019, 8:58 AM  [facility] Physical  Therapy [address] [place], [place], [address] Phone: [phone]   Fax:  [phone]  Name: Amy Hooper MRN: [id number] Date of Birth: 1951-01-17

## 2019-10-26 ENCOUNTER — Other Ambulatory Visit: Payer: Self-pay

## 2019-10-26 ENCOUNTER — Ambulatory Visit (INDEPENDENT_AMBULATORY_CARE_PROVIDER_SITE_OTHER): Payer: No Typology Code available for payment source | Admitting: Rehabilitative and Restorative Service Providers"

## 2019-10-26 ENCOUNTER — Encounter: Payer: Self-pay | Admitting: Rehabilitative and Restorative Service Providers"

## 2019-10-26 DIAGNOSIS — M6281 Muscle weakness (generalized): Secondary | ICD-10-CM | POA: Diagnosis not present

## 2019-10-26 DIAGNOSIS — M25511 Pain in right shoulder: Secondary | ICD-10-CM | POA: Diagnosis not present

## 2019-10-26 DIAGNOSIS — R600 Localized edema: Secondary | ICD-10-CM

## 2019-10-26 DIAGNOSIS — G8929 Other chronic pain: Secondary | ICD-10-CM

## 2019-10-26 DIAGNOSIS — R293 Abnormal posture: Secondary | ICD-10-CM | POA: Diagnosis not present

## 2019-10-26 NOTE — Therapy (Addendum)
[facility] Physical Therapy [address] [place], [place], [address] Phone: [phone]   Fax:  [phone]  Physical Therapy Treatment  Patient Details  Name: Amy Hooper MRN: [id number] Date of Birth: 16-Jun-1950 Referring Provider (PT): Dr. Berenice Primas   Encounter Date: 10/26/2019   PT End of Session - 10/26/19 1208    Visit Number 6    Number of Visits 12    Date for PT Re-Evaluation 11/06/19    Progress Note Due on Visit 10    PT Start Time 1145    PT Stop Time 1225    PT Time Calculation (min) 40 min    Activity Tolerance Patient tolerated treatment well    Behavior During Therapy [facility] for tasks assessed/performed           Past Medical History:  Diagnosis Date  . HTN (hypertension)   . Osteoarthritis   . Palpitation     Past Surgical History:  Procedure Laterality Date  . ROTATOR CUFF REPAIR Left   . TUBAL LIGATION      There were no vitals filed for this visit.   Subjective Assessment - 10/26/19 1206    Subjective Pt. indicated some mild reduction in symptoms at times but still hurting similar in back of shoulder upper arm.  Rated 6-7/10 at times.    Pertinent History History of Lt shoulder surgery several years ago.    Diagnostic tests MRI    Patient Stated Goals Reduce pain    Currently in Pain? Yes    Pain Score 7     Pain Location Shoulder    Pain Orientation Right    Pain Descriptors / Indicators Aching;Throbbing;Tightness    Pain Type Chronic pain;Surgical pain    Pain Onset More than a month ago    Pain Frequency Intermittent    Aggravating Factors  arm movements, sleeping, resting pains at times    Pain Relieving Factors cold icing    Effect of Pain on Daily Activities Arm lifting, reaching                             [facility] Adult PT Treatment/Exercise - 10/26/19 0001      Shoulder Exercises: Standing   External Rotation Right;20 reps    Theraband Level (Shoulder External Rotation) Level 2 (Red)     Extension Both   3x10   Theraband Level (Shoulder Extension) Level 2 (Red)    Row Both;Other (comment)   3 x 10   Theraband Level (Shoulder Row) Level 2 (Red)      Shoulder Exercises: Stretch   Other Shoulder Stretches cross arm stretch Rt UE 15 sec x 5      Modalities   Modalities Moist Heat      Moist Heat Therapy   Number Minutes Moist Heat 5 Minutes   c stretching   Moist Heat Location Shoulder   rt     Iontophoresis   Type of Iontophoresis --    Location --    Dose --    Time --      Vasopneumatic   Number Minutes Vasopneumatic  --      Manual Therapy   Manual therapy comments compression to Rt infraspinatus             10/31/19 0001  Trigger Point Dry Needling  Consent Given? Yes  Education Handout Provided Yes  Muscles Treated Upper Quadrant Infraspinatus (Rt)  Infraspinatus Response Twitch response elicited  PT Long Term Goals - 10/24/19 0857      PT LONG TERM GOAL #1   Title Patient will demonstrate independent use of home exercise program to facilitate ability to maintain/progress functional gains from skilled physical therapy services.    Status On-going      PT LONG TERM GOAL #2   Title Patient will demonstrate/report pain at worst less than or equal to 2/10 to facilitate minimal limitation in daily activity secondary to pain symptoms.    Baseline now 6/10    Status On-going      PT LONG TERM GOAL #3   Title Patient will demonstrate Rt [place] joint mobility WFL to facilitate usual self care, dressing, reaching overhead at PLOF s limitation due to symptoms.    Baseline mild restrictions now    Status On-going      PT LONG TERM GOAL #4   Title Patient will demonstrate Rt UE MMT 5/5 throughout to facilitate usual lifting, carrying in functional activity to PLOF s limitation.    Baseline 4/5 overall now    Status On-going      PT LONG TERM GOAL #5   Title Pt. will demonstrate postural s deviation to facilitate reduced  strain on Rt shoulder during daily work activity.    Status On-going                 Plan - 10/26/19 1209    Clinical Impression Statement Very strong presentation of TrP tenderness c concordant symptoms from Rt infraspinatus throughout.  Addressed c compression techniques and DN with reduction of pain symptoms.  Continued skilled PT services indicated at this time.    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)   Stability/Clinical Decision Making Stable/Uncomplicated    Rehab Potential Good    PT Frequency 2x / week    PT Duration 6 weeks    PT Treatment/Interventions ADLs/Self Care Home Management;Electrical Stimulation;Iontophoresis 4mg /ml Dexamethasone;Moist Heat;Traction;Balance training;Therapeutic exercise;Therapeutic activities;Functional mobility training;Gait training;Ultrasound;Neuromuscular re-education;Patient/family education;Manual techniques;Vasopneumatic Device;Taping;Passive range of motion;Dry needling;Spinal Manipulations;Joint Manipulations    PT Next Visit Plan DN if indicated, improved mobility/strength    PT Home Exercise Plan [id number]    Consulted and Agree with Plan of Care Patient           Patient will benefit from skilled therapeutic intervention in order to improve the following deficits and impairments:  Decreased endurance, Hypomobility, Increased edema, Decreased activity tolerance, Decreased strength, Impaired UE functional use, Pain, Decreased mobility, Decreased range of motion, Postural dysfunction, Impaired flexibility, Decreased coordination  Visit Diagnosis: Chronic right shoulder pain  Muscle weakness (generalized)  Abnormal posture  Localized edema     Problem List There are no problems to display for this  patient.   Girtha Rm 10/26/2019, 12:19 PM   Added DN documentation  Scot Jun, PT, DPT, OCS, ATC 10/31/19  8:07 AM     [facility] Physical Therapy [address] [place], [place], [address] Phone: [phone]   Fax:  [phone]  Name: Alante Weimann MRN: [id number] Date of Birth: 04/28/1950

## 2019-10-31 ENCOUNTER — Ambulatory Visit (INDEPENDENT_AMBULATORY_CARE_PROVIDER_SITE_OTHER): Payer: No Typology Code available for payment source | Admitting: Rehabilitative and Restorative Service Providers"

## 2019-10-31 ENCOUNTER — Encounter: Payer: Self-pay | Admitting: Rehabilitative and Restorative Service Providers"

## 2019-10-31 ENCOUNTER — Other Ambulatory Visit: Payer: Self-pay

## 2019-10-31 DIAGNOSIS — M6281 Muscle weakness (generalized): Secondary | ICD-10-CM | POA: Diagnosis not present

## 2019-10-31 DIAGNOSIS — R600 Localized edema: Secondary | ICD-10-CM

## 2019-10-31 DIAGNOSIS — R293 Abnormal posture: Secondary | ICD-10-CM | POA: Diagnosis not present

## 2019-10-31 DIAGNOSIS — G8929 Other chronic pain: Secondary | ICD-10-CM

## 2019-10-31 DIAGNOSIS — M25511 Pain in right shoulder: Secondary | ICD-10-CM

## 2019-10-31 NOTE — Therapy (Signed)
[place], [place], [address] Phone: [phone]   Fax:  [phone]  Physical Therapy Treatment  Patient Details  Name: Amy Hooper MRN: [id number] Date of Birth: 03/16/1951 Referring Provider (PT): Dr. Berenice Primas   Encounter Date: 10/31/2019   PT End of Session - 10/31/19 0808    Visit Number 7    Number of Visits 12    Date for PT Re-Evaluation 11/06/19    Progress Note Due on Visit 10    PT Start Time 0800    PT Stop Time 0840    PT Time Calculation (min) 40 min    Activity Tolerance Patient tolerated treatment well    Behavior During Therapy [facility] for tasks assessed/performed           Past Medical History:  Diagnosis Date  . HTN (hypertension)   . Osteoarthritis   . Palpitation     Past Surgical History:  Procedure Laterality Date  . ROTATOR CUFF REPAIR Left   . TUBAL LIGATION      There were no vitals filed for this visit.   Subjective Assessment - 10/31/19 0808    Subjective Pt. stated having less complaints of pain in back of shoulder since last visit.  Was some soreness yesterday but doing better today, not reporting pain.    Pertinent History History of Lt shoulder surgery several years ago.    Diagnostic tests MRI    Patient Stated Goals Reduce pain    Currently in Pain? No/denies    Pain Score 0-No pain    Pain Onset More than a month ago                             [facility] Adult PT Treatment/Exercise - 10/31/19 0001      Exercises   Other Exercises  additional time spent and required for exercise due to emphasis on slow control movement pattern      Shoulder Exercises: Standing   External Rotation Strengthening;Both;Other (comment)   3 x 10   Theraband Level (Shoulder External Rotation) Level 3 (Green)    Extension Both   3 x 10   Theraband Level (Shoulder Extension) Level 3 (Green)    Row Both;Strengthening;Other (comment)   3 x 10   Theraband Level (Shoulder  Row) Level 3 (Green)    Other Standing Exercises UE ranger flexion x 10, 2 x 10 c 1.5 lbs      Shoulder Exercises: Pulleys   Flexion 2 minutes    ABduction 2 minutes      Shoulder Exercises: ROM/Strengthening   UBE (Upper Arm Bike) Lvl 2.5 3 mins fwd/back each way                       PT Long Term Goals - 10/24/19 0857      PT LONG TERM GOAL #1   Title Patient will demonstrate independent use of home exercise program to facilitate ability to maintain/progress functional gains from skilled physical therapy services.    Status On-going      PT LONG TERM GOAL #2   Title Patient will demonstrate/report pain at worst less than or equal to 2/10 to facilitate minimal limitation in daily activity secondary to pain symptoms.    Baseline now 6/10    Status On-going      PT LONG TERM GOAL #3   Title Patient will demonstrate Rt GH joint mobility  WFL to facilitate usual self care, dressing, reaching overhead at PLOF s limitation due to symptoms.    Baseline mild restrictions now    Status On-going      PT LONG TERM GOAL #4   Title Patient will demonstrate Rt UE MMT 5/5 throughout to facilitate usual lifting, carrying in functional activity to PLOF s limitation.    Baseline 4/5 overall now    Status On-going      PT LONG TERM GOAL #5   Title Pt. will demonstrate postural s deviation to facilitate reduced strain on Rt shoulder during daily work activity.    Status On-going                 Plan - 10/31/19 0737    Clinical Impression Statement Reduction in pain symptom presentation today promoted abilty to progress active movement and strengthening intervention c good overall performance.  Occasional mild symptoms noted in Rt upper arm but reduced c rest and not progressive in nature.  Continued skilled PT for improved strength warrented.    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)   Stability/Clinical Decision Making Stable/Uncomplicated    Rehab Potential Good    PT Frequency 2x / week    PT Duration 6 weeks    PT Treatment/Interventions ADLs/Self Care Home Management;Electrical Stimulation;Iontophoresis 4mg /ml Dexamethasone;Moist Heat;Traction;Balance training;Therapeutic exercise;Therapeutic activities;Functional mobility training;Gait training;Ultrasound;Neuromuscular re-education;Patient/family education;Manual techniques;Vasopneumatic Device;Taping;Passive range of motion;Dry needling;Spinal Manipulations;Joint Manipulations    PT Next Visit Plan DN if indicated, improved mobility/strength    PT Home Exercise Plan [id number]    Consulted and Agree with Plan of Care Patient           Patient will benefit from skilled therapeutic intervention in order to improve the following deficits and impairments:  Decreased endurance, Hypomobility, Increased edema, Decreased activity tolerance, Decreased strength, Impaired UE functional use, Pain, Decreased mobility, Decreased range of motion, Postural dysfunction, Impaired flexibility, Decreased coordination  Visit Diagnosis: Chronic right shoulder pain  Muscle weakness (generalized)  Abnormal posture  Localized edema     Problem List There are no problems to display for this patient.   Scot Jun, PT, DPT, OCS, ATC 10/31/19  8:39 AM    [facility] [address] [place], [place], [address] Phone: [phone]   Fax:  [phone]  Name: Amy Hooper MRN: [id number] Date of Birth: 18-Feb-1951

## 2019-11-02 ENCOUNTER — Ambulatory Visit (INDEPENDENT_AMBULATORY_CARE_PROVIDER_SITE_OTHER): Payer: No Typology Code available for payment source | Admitting: Rehabilitative and Restorative Service Providers"

## 2019-11-02 ENCOUNTER — Encounter: Payer: Self-pay | Admitting: Rehabilitative and Restorative Service Providers"

## 2019-11-02 ENCOUNTER — Other Ambulatory Visit: Payer: Self-pay

## 2019-11-02 DIAGNOSIS — M6281 Muscle weakness (generalized): Secondary | ICD-10-CM | POA: Diagnosis not present

## 2019-11-02 DIAGNOSIS — R293 Abnormal posture: Secondary | ICD-10-CM

## 2019-11-02 DIAGNOSIS — R600 Localized edema: Secondary | ICD-10-CM

## 2019-11-02 DIAGNOSIS — M25511 Pain in right shoulder: Secondary | ICD-10-CM

## 2019-11-02 DIAGNOSIS — G8929 Other chronic pain: Secondary | ICD-10-CM

## 2019-11-02 NOTE — Therapy (Signed)
[place], [place], [address] Phone: [phone]   Fax:  [phone]  Physical Therapy Treatment  Patient Details  Name: Amy Hooper MRN: [id number] Date of Birth: April 20, 1950 Referring Provider (PT): Dr. Berenice Primas   Encounter Date: 11/02/2019   PT End of Session - 11/02/19 1121    Visit Number 8    Number of Visits 12    Date for PT Re-Evaluation 11/06/19    Progress Note Due on Visit 10    PT Start Time 1107    PT Stop Time 1145    PT Time Calculation (min) 38 min    Activity Tolerance Patient tolerated treatment well    Behavior During Therapy [facility] for tasks assessed/performed           Past Medical History:  Diagnosis Date  . HTN (hypertension)   . Osteoarthritis   . Palpitation     Past Surgical History:  Procedure Laterality Date  . ROTATOR CUFF REPAIR Left   . TUBAL LIGATION      There were no vitals filed for this visit.   Subjective Assessment - 11/02/19 1121    Subjective Pt. indicated soreness at times from last visit but doing better with pain symptoms.  No pain upon arrival today.    Pertinent History History of Lt shoulder surgery several years ago.    Diagnostic tests MRI    Patient Stated Goals Reduce pain    Currently in Pain? No/denies    Pain Score 0-No pain    Pain Onset More than a month ago                             [facility] Adult PT Treatment/Exercise - 11/02/19 0001      Shoulder Exercises: Standing   External Rotation Strengthening;Right   3 x 10   Theraband Level (Shoulder External Rotation) Level 3 (Green)    Internal Rotation Strengthening;Right;Other (comment)   3 x 10   Theraband Level (Shoulder Internal Rotation) Level 3 (Green)    Flexion Strengthening;Right   3 x 10    Shoulder Flexion Weight (lbs) 2    Row Both;Strengthening   3 x 10   Theraband Level (Shoulder Row) Level 3 (Green)    Other Standing Exercises scaption 2 lb 3 x 10 90 deg        Shoulder Exercises: ROM/Strengthening   UBE (Upper Arm Bike) Lvl 3 3 mins fwd/back each way      Shoulder Exercises: Stretch   Other Shoulder Stretches cross arm stretch Rt UE 15 sec x 5      Iontophoresis   Type of Iontophoresis Dexamethasone    Location Rt posterior shoulder    Dose 1.0 cc    Time 6 hour wear home patch      Manual Therapy   Manual therapy comments compression to Rt infraspinatus            Trigger Point Dry Needling - 11/02/19 0001    Consent Given? Yes    Education Handout Provided Previously provided    Muscles Treated Upper Quadrant Infraspinatus   Rt   Infraspinatus Response Twitch response elicited                     PT Long Term Goals - 10/24/19 0857      PT LONG TERM GOAL #1   Title Patient will demonstrate independent use of home  exercise program to facilitate ability to maintain/progress functional gains from skilled physical therapy services.    Status On-going      PT LONG TERM GOAL #2   Title Patient will demonstrate/report pain at worst less than or equal to 2/10 to facilitate minimal limitation in daily activity secondary to pain symptoms.    Baseline now 6/10    Status On-going      PT LONG TERM GOAL #3   Title Patient will demonstrate Rt [place] joint mobility WFL to facilitate usual self care, dressing, reaching overhead at PLOF s limitation due to symptoms.    Baseline mild restrictions now    Status On-going      PT LONG TERM GOAL #4   Title Patient will demonstrate Rt UE MMT 5/5 throughout to facilitate usual lifting, carrying in functional activity to PLOF s limitation.    Baseline 4/5 overall now    Status On-going      PT LONG TERM GOAL #5   Title Pt. will demonstrate postural s deviation to facilitate reduced strain on Rt shoulder during daily work activity.    Status On-going                 Plan - 11/02/19 1153    Clinical Impression Statement Strengthening intervention continued to be required at  this time to promote improved functional reach/lifting for daily activity.   Continued skilled PT indicated.    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)   Stability/Clinical Decision Making Stable/Uncomplicated    Rehab Potential Good    PT Frequency 2x / week    PT Duration 6 weeks    PT Treatment/Interventions ADLs/Self Care Home Management;Electrical Stimulation;Iontophoresis 4mg /ml Dexamethasone;Moist Heat;Traction;Balance training;Therapeutic exercise;Therapeutic activities;Functional mobility training;Gait training;Ultrasound;Neuromuscular re-education;Patient/family education;Manual techniques;Vasopneumatic Device;Taping;Passive range of motion;Dry needling;Spinal Manipulations;Joint Manipulations    PT Next Visit Plan DN prn, progress shoulder height/overhead reaching.    PT Home Exercise Plan [id number]    Consulted and Agree with Plan of Care Patient           Patient will benefit from skilled therapeutic intervention in order to improve the following deficits and impairments:  Decreased endurance, Hypomobility, Increased edema, Decreased activity tolerance, Decreased strength, Impaired UE functional use, Pain, Decreased mobility, Decreased range of motion, Postural dysfunction, Impaired flexibility, Decreased coordination  Visit Diagnosis: Chronic right shoulder pain  Muscle weakness (generalized)  Abnormal posture  Localized edema     Problem List There are no problems to display for this patient.   Scot Jun, PT, DPT, OCS, ATC 11/02/19  11:55 AM    [facility] [address] [place], [place], [address] Phone: [phone]   Fax:  [phone]  Name: Amy Hooper MRN: [id number] Date of Birth:  Dec 12, 1950

## 2019-11-05 ENCOUNTER — Encounter: Payer: No Typology Code available for payment source | Admitting: Rehabilitative and Restorative Service Providers"

## 2019-11-05 ENCOUNTER — Telehealth: Payer: Self-pay | Admitting: Rehabilitative and Restorative Service Providers"

## 2019-11-05 NOTE — Telephone Encounter (Signed)
Called and LVM regarding appointment today.  Next appointment Aug 18 at [facility], PT, DPT, OCS, ATC 11/05/19  9:06 AM

## 2019-11-07 ENCOUNTER — Other Ambulatory Visit: Payer: Self-pay

## 2019-11-07 ENCOUNTER — Encounter: Payer: Self-pay | Admitting: Rehabilitative and Restorative Service Providers"

## 2019-11-07 ENCOUNTER — Ambulatory Visit (INDEPENDENT_AMBULATORY_CARE_PROVIDER_SITE_OTHER): Payer: No Typology Code available for payment source | Admitting: Rehabilitative and Restorative Service Providers"

## 2019-11-07 DIAGNOSIS — M6281 Muscle weakness (generalized): Secondary | ICD-10-CM

## 2019-11-07 DIAGNOSIS — M25511 Pain in right shoulder: Secondary | ICD-10-CM | POA: Diagnosis not present

## 2019-11-07 DIAGNOSIS — R293 Abnormal posture: Secondary | ICD-10-CM | POA: Diagnosis not present

## 2019-11-07 DIAGNOSIS — G8929 Other chronic pain: Secondary | ICD-10-CM

## 2019-11-07 DIAGNOSIS — R600 Localized edema: Secondary | ICD-10-CM | POA: Diagnosis not present

## 2019-11-07 NOTE — Therapy (Addendum)
[place], [place], [address] Phone: [phone]   Fax:  [phone]  Physical Therapy Treatment/Progress Note/Recert/Discharge  Patient Details  Name: Amy Hooper MRN: [id number] Date of Birth: 05-18-1950 Referring Provider (PT): Dr. Berenice Primas   Encounter Date: 11/07/2019  Progress Note Reporting Period 09/25/2019 to 11/07/2019  See note below for Objective Data and Assessment of Progress/Goals.        PT End of Session - 11/07/19 0804    Visit Number 9    Number of Visits 16    Date for PT Re-Evaluation 12/19/19    Progress Note Due on Visit 28    PT Start Time 0759    PT Stop Time 0838    PT Time Calculation (min) 39 min    Activity Tolerance Patient tolerated treatment well    Behavior During Therapy [facility] for tasks assessed/performed           Past Medical History:  Diagnosis Date  . HTN (hypertension)   . Osteoarthritis   . Palpitation     Past Surgical History:  Procedure Laterality Date  . ROTATOR CUFF REPAIR Left   . TUBAL LIGATION      There were no vitals filed for this visit.   Subjective Assessment - 11/07/19 0802    Subjective Pt. indicated doing better in pain.  Pt. stated a fatigue "weird" pain after prolonged computer work at times. GROC +6 at this time.    Pertinent History History of Lt shoulder surgery several years ago.    Diagnostic tests MRI    Patient Stated Goals Reduce pain    Currently in Pain? No/denies    Pain Score 0-No pain    Pain Location Shoulder    Pain Orientation Right    Pain Descriptors / Indicators Aching    Pain Onset More than a month ago    Pain Frequency Intermittent    Aggravating Factors  fatigue c repetitive activity              [facility] PT Assessment - 11/07/19 0001      Assessment   Medical Diagnosis S/P Rt shoulder arthroscopy, biceps tenodesis    Referring Provider (PT) Dr. Berenice Primas    Onset Date/Surgical Date 08/15/19    Hand  Dominance Right      AROM   Right Shoulder Flexion 150 Degrees      PROM   Right Shoulder Flexion 155 Degrees      Strength   Right Shoulder Flexion 4/5    Right Shoulder ABduction 4/5    Right Shoulder Internal Rotation 5/5    Right Shoulder External Rotation 4+/5                         [facility] Adult PT Treatment/Exercise - 11/07/19 0001      Shoulder Exercises: Supine   Other Supine Exercises supine wand flexion 2 lb bar 2 x 10 3 sec hold stretch      Shoulder Exercises: Standing   External Rotation Strengthening;Right   3 x 10 c fatigue hold on last rep   Theraband Level (Shoulder External Rotation) Level 3 (Green)    Internal Rotation Strengthening;Right;Other (comment)   3 x 10   Theraband Level (Shoulder Internal Rotation) Level 3 (Green)    Other Standing Exercises 100 deg ball circles ccw, cw 30 x 2 each way    Other Standing Exercises standing flexion to abd to side and reverse 2  x 10       Shoulder Exercises: ROM/Strengthening   UBE (Upper Arm Bike) Lvl 3 5 mins fwd/back each way                       PT Long Term Goals - 11/07/19 8242      PT LONG TERM GOAL #1   Title Patient will demonstrate independent use of home exercise program to facilitate ability to maintain/progress functional gains from skilled physical therapy services.    Time 6    Period Weeks    Status Achieved    Target Date 12/19/19      PT LONG TERM GOAL #2   Title Patient will demonstrate/report pain at worst less than or equal to 2/10 to facilitate minimal limitation in daily activity secondary to pain symptoms.    Period Weeks    Status On-going    Target Date 12/19/19      PT LONG TERM GOAL #3   Title Patient will demonstrate Rt [place] joint mobility WFL to facilitate usual self care, dressing, reaching overhead at PLOF s limitation due to symptoms.    Baseline mild restrictions now    Status Achieved      PT LONG TERM GOAL #4   Title Patient will demonstrate  Rt UE MMT 5/5 throughout to facilitate usual lifting, carrying in functional activity to PLOF s limitation.    Baseline 4/5 overall now    Status On-going    Target Date 12/19/19      PT LONG TERM GOAL #5   Title Pt. will demonstrate postural s deviation to facilitate reduced strain on Rt shoulder during daily work activity.    Status On-going    Target Date 12/19/19                 Plan - 11/07/19 0818    Clinical Impression Statement Pt. has attended 9 visits during course of treatment.  Pt. has reported +6 GROC at this time.  Pt. has reported reduced complaints of pain severity.  See objective data for updated information.  Pt. has made improvement in active mobility as well as strength.  Pt. does continue to demonstrate impairments related to strength and recreational activity tolerance that may continue to benefit from skilled PT services to address.    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)   Stability/Clinical Decision Making Stable/Uncomplicated    Rehab Potential Good    PT Frequency --   1-2x/week   PT Duration 6 weeks    PT Treatment/Interventions ADLs/Self Care Home Management;Electrical Stimulation;Iontophoresis 23m/ml Dexamethasone;Moist Heat;Traction;Balance training;Therapeutic exercise;Therapeutic activities;Functional mobility training;Gait training;Ultrasound;Neuromuscular re-education;Patient/family education;Manual techniques;Vasopneumatic Device;Taping;Passive range of motion;Dry needling;Spinal Manipulations;Joint Manipulations    PT Next Visit Plan DN prn, strengthening progression, endurance for repetitive activities.    PT Home Exercise Plan [id number]    Consulted and Agree with Plan of Care Patient           Patient will benefit from  skilled therapeutic intervention in order to improve the following deficits and impairments:  Decreased endurance, Hypomobility, Increased edema, Decreased activity tolerance, Decreased strength, Impaired UE functional use, Pain, Decreased mobility, Decreased range of motion, Postural dysfunction, Impaired flexibility, Decreased coordination  Visit Diagnosis: Chronic right shoulder pain  Muscle weakness (generalized)  Abnormal posture  Localized edema     Problem List  There are no problems to display for this patient.   Scot Jun, PT, DPT, OCS, ATC 11/07/19  8:36 AM  PHYSICAL THERAPY DISCHARGE SUMMARY  Visits from Start of Care:9  Current functional level related to goals / functional outcomes: See note   Remaining deficits: See note   Education / Equipment: HEP Plan: Patient agrees to discharge.  Patient goals were partially met. Patient is being discharged due to not returning since the last visit.  ?????     Scot Jun, PT, DPT, OCS, ATC 01/08/20  10:46 AM     [facility] [address] [place], [place], [address] Phone: [phone]   Fax:  [phone]  Name: Amy Hooper MRN: [id number] Date of Birth: 05/09/1950

## 2019-11-20 ENCOUNTER — Encounter: Payer: Self-pay | Admitting: Orthopaedic Surgery

## 2019-11-20 ENCOUNTER — Ambulatory Visit (INDEPENDENT_AMBULATORY_CARE_PROVIDER_SITE_OTHER): Payer: No Typology Code available for payment source | Admitting: Orthopaedic Surgery

## 2019-11-20 ENCOUNTER — Telehealth: Payer: Self-pay

## 2019-11-20 ENCOUNTER — Ambulatory Visit (INDEPENDENT_AMBULATORY_CARE_PROVIDER_SITE_OTHER): Payer: No Typology Code available for payment source

## 2019-11-20 ENCOUNTER — Ambulatory Visit: Payer: Self-pay

## 2019-11-20 VITALS — Ht 63.0 in | Wt 209.2 lb

## 2019-11-20 DIAGNOSIS — M170 Bilateral primary osteoarthritis of knee: Secondary | ICD-10-CM

## 2019-11-20 NOTE — Telephone Encounter (Signed)
Please precert for bilateral gel injections. This is Dr.Xu's patient.

## 2019-11-20 NOTE — Progress Notes (Signed)
Office Visit Note   [name]           Date of Birth: 1950/04/05           MRN: [id number] Visit Date: 11/20/2019              Requested by: No referring provider defined for this encounter. PCP: System, Pcp Not In   Assessment & Plan: Visit Diagnoses:  1. Bilateral primary osteoarthritis of knee     Plan: Impression is advanced generative joint disease both knees left greater than right.  Due to the patient wanting to wait until next summer before proceeding with knee replacement surgery, she would like to go ahead and get approval for viscosupplementation injections.  We will submit for these and she will follow up with [place] once approved.  Call with concerns or questions in the meantime.  Follow-Up Instructions: Return for once approved for visco inj.   Orders:  Orders Placed This Encounter  Procedures  . XR KNEE 3 VIEW RIGHT  . XR KNEE 3 VIEW LEFT   No orders of the defined types were placed in this encounter.     Procedures: No procedures performed   Clinical Data: No additional findings.   Subjective: Chief Complaint  Patient presents with  . Right Knee - Pain  . Left Knee - Pain    HPI patient is a pleasant 69 year old female who comes in today with bilateral knee pain left greater than right.  History of advanced degenerative joint disease both knees.  She has previously been seen at [facility] where she has had both knees scoped.  One in 2016 and the other 2018 but is unsure which was done when.  She has had continued pain to the entire aspect of both knees.  Pain is worse with activity and when she is sleeping at night.  She has been taking Tylenol and Celebrex without significant relief of symptoms.  She has had previous cortisone injections with the last one being in mid June of this past year.  These seem to help for only about a month.  Is not previously had viscosupplementation injections.  She does note that she would like  to have total knee replacement surgery in June 2022.  Review of Systems as detailed in HPI.  All others reviewed and are negative.   Objective: Vital Signs: Ht 5\' 3"  (1.6 m)   Wt 209 lb 3.2 oz (94.9 kg)   BMI 37.06 kg/m   Physical Exam well-developed well-nourished female no acute distress.  Alert oriented x3.  Ortho Exam examination of both knees reveals trace effusions.  Range of motion 0 to 100 degrees.  Right knee has lateral and medial joint line tenderness.  Left knee has medial joint line tenderness.  Ligaments are stable.  She is neurovascularly intact distally.  Specialty Comments:  No specialty comments available.  Imaging: XR KNEE 3 VIEW LEFT  Result Date: 11/20/2019 Advanced tricompartmental degenerative changes  XR KNEE 3 VIEW RIGHT  Result Date: 11/20/2019 Advanced tricompartmental degenerative changes    PMFS History: There are no problems to display for this patient.  Past Medical History:  Diagnosis Date  . HTN (hypertension)   . Osteoarthritis   . Palpitation     Family History  Problem Relation Age of Onset  . Hypothyroidism Mother   . Hyperlipidemia Other   . Kidney failure Father     Past Surgical History:  Procedure Laterality Date  . ROTATOR CUFF REPAIR  Left   . TUBAL LIGATION     Social History   Occupational History  . 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 Smoking status: Never Smoker  Substance and Sexual Activity  . Alcohol use: Not on file  . Drug use: Not on file  . Sexual activity: Not on file

## 2019-11-20 NOTE — Telephone Encounter (Signed)
Noted  

## 2019-11-23 ENCOUNTER — Telehealth: Payer: Self-pay

## 2019-11-23 NOTE — Telephone Encounter (Signed)
Submitted VOB, Monovisc, bilateral knee. 

## 2019-11-29 ENCOUNTER — Encounter: Payer: Self-pay | Admitting: Orthopaedic Surgery

## 2019-11-30 ENCOUNTER — Telehealth: Payer: Self-pay

## 2019-11-30 NOTE — Telephone Encounter (Signed)
Called and left a VM advising patient that gel injection is not covered by her insurance.  Please advise on the next option for patient.  Cb# [phone].  Please advise.  Thank you.

## 2019-12-02 NOTE — Telephone Encounter (Signed)
I think she should come back in for ana ppointment.  Thanks.

## 2019-12-03 NOTE — Telephone Encounter (Signed)
Can we make her an appt to see Erlinda Hong please

## 2019-12-05 ENCOUNTER — Encounter: Payer: Self-pay | Admitting: Orthopaedic Surgery

## 2019-12-05 ENCOUNTER — Ambulatory Visit (INDEPENDENT_AMBULATORY_CARE_PROVIDER_SITE_OTHER): Payer: No Typology Code available for payment source | Admitting: Orthopaedic Surgery

## 2019-12-05 VITALS — Ht 63.0 in | Wt 209.2 lb

## 2019-12-05 DIAGNOSIS — M1712 Unilateral primary osteoarthritis, left knee: Secondary | ICD-10-CM | POA: Diagnosis not present

## 2019-12-05 MED ORDER — METHYLPREDNISOLONE ACETATE 40 MG/ML IJ SUSP
40.0000 mg | INTRAMUSCULAR | Status: AC | PRN
Start: 2019-12-05 — End: 2019-12-05
  Administered 2019-12-05: 40 mg via INTRA_ARTICULAR

## 2019-12-05 MED ORDER — BUPIVACAINE HCL 0.25 % IJ SOLN
2.0000 mL | INTRAMUSCULAR | Status: AC | PRN
  Administered 2019-12-05: 2 mL via INTRA_ARTICULAR

## 2019-12-05 MED ORDER — LIDOCAINE HCL 1 % IJ SOLN
2.0000 mL | INTRAMUSCULAR | Status: AC | PRN
  Administered 2019-12-05: 2 mL

## 2019-12-05 NOTE — Progress Notes (Signed)
   Office Visit Note   [name]           Date of Birth: 09-04-1950           MRN: [id number] Visit Date: 12/05/2019              Requested by: No referring provider defined for this encounter. PCP: System, Pcp Not In   Assessment & Plan: Visit Diagnoses:  1. Unilateral primary osteoarthritis, left knee     Plan: Impression is advanced generative joint disease left knee.  We proceeded with left knee cortisone injection today.  She will follow up with [place] in the next 1 to 2 weeks for right knee cortisone injection.  Follow-Up Instructions: Return if symptoms worsen or fail to improve.   Orders:  Orders Placed This Encounter  Procedures  . Large Joint Inj: L knee   No orders of the defined types were placed in this encounter.     Procedures: Large Joint Inj: L knee on 12/05/2019 8:53 AM Indications: pain Details: 22 G needle, anterolateral approach Medications: 2 mL lidocaine 1 %; 2 mL bupivacaine 0.25 %; 40 mg methylPREDNISolone acetate 40 MG/ML      Clinical Data: No additional findings.   Subjective: Chief Complaint  Patient presents with  . Left Knee - Pain    HPI patient is a pleasant 69 year old female who was seen in our office a few weeks back for bilateral knee advanced degenerative joint disease left greater than right.  She is trying to hold off on getting her knee replaced until June of next year and wanted to try and get approval for viscosupplementation injections.  Injections were denied so she comes in today requesting a cortisone injection to the left knee.  Her last cortisone injections to both knees were about 3 months ago.  Good relief but not long-lasting.  Review of Systems as detailed in HPI.  All other reviewed and are negative.   Objective: Vital Signs: Ht 5\' 3"  (1.6 m)   Wt 209 lb 3.2 oz (94.9 kg)   BMI 37.06 kg/m   Physical Exam well-developed well-nourished female in no alert oriented x3.  Ortho Exam stable  knee exam  Specialty Comments:  No specialty comments available.  Imaging: No new imaging   PMFS History: There are no problems to display for this patient.  Past Medical History:  Diagnosis Date  . HTN (hypertension)   . Osteoarthritis   . Palpitation     Family History  Problem Relation Age of Onset  . Hypothyroidism Mother   . Hyperlipidemia Other   . Kidney failure Father     Past Surgical History:  Procedure Laterality Date  . ROTATOR CUFF REPAIR Left   . TUBAL LIGATION     Social History   Occupational History  . 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 Smoking status: Never Smoker  Substance and Sexual Activity  . Alcohol use: Not on file  . Drug use: Not on file  . Sexual activity: Not on file

## 2019-12-13 ENCOUNTER — Encounter: Payer: Self-pay | Admitting: Orthopaedic Surgery

## 2019-12-13 ENCOUNTER — Ambulatory Visit (INDEPENDENT_AMBULATORY_CARE_PROVIDER_SITE_OTHER): Payer: No Typology Code available for payment source | Admitting: Orthopaedic Surgery

## 2019-12-13 DIAGNOSIS — M1711 Unilateral primary osteoarthritis, right knee: Secondary | ICD-10-CM | POA: Diagnosis not present

## 2019-12-13 MED ORDER — METHYLPREDNISOLONE ACETATE 40 MG/ML IJ SUSP
40.0000 mg | INTRAMUSCULAR | Status: AC | PRN
  Administered 2019-12-13: 40 mg via INTRA_ARTICULAR

## 2019-12-13 MED ORDER — BUPIVACAINE HCL 0.25 % IJ SOLN
2.0000 mL | INTRAMUSCULAR | Status: AC | PRN
  Administered 2019-12-13: 2 mL via INTRA_ARTICULAR

## 2019-12-13 MED ORDER — LIDOCAINE HCL 1 % IJ SOLN
2.0000 mL | INTRAMUSCULAR | Status: AC | PRN
  Administered 2019-12-13: 2 mL

## 2019-12-13 NOTE — Progress Notes (Signed)
   Office Visit Note   [name]           Date of Birth: 1951/02/02           MRN: [id number] Visit Date: 12/13/2019              Requested by: No referring provider defined for this encounter. PCP: Pcp, No   Assessment & Plan: Visit Diagnoses:  1. Unilateral primary osteoarthritis, right knee     Plan: Impression is degenerative joint disease right knee.  We will proceed with right knee cortisone injection today.  She will follow up with [place] as needed.  Follow-Up Instructions: Return if symptoms worsen or fail to improve.   Orders:  No orders of the defined types were placed in this encounter.  No orders of the defined types were placed in this encounter.     Procedures: Large Joint Inj: R knee on 12/13/2019 8:38 AM Indications: pain Details: 22 G needle, anterolateral approach Medications: 2 mL lidocaine 1 %; 2 mL bupivacaine 0.25 %; 40 mg methylPREDNISolone acetate 40 MG/ML      Clinical Data: No additional findings.   Subjective: Chief Complaint  Patient presents with  . Left Knee - Follow-up  . Right Knee - Follow-up    HPI patient is a pleasant 69 year old female who comes in today for right cortisone injection.  We have recently seen her for her advanced degenerative joint disease of the right knee.  We proceeded with left knee cortisone injection last week which has provided significant relief.     Objective: Vital Signs: There were no vitals taken for this visit.    Ortho Exam stable right knee exam  Specialty Comments:  No specialty comments available.  Imaging: No new imaging   PMFS History: There are no problems to display for this patient.  Past Medical History:  Diagnosis Date  . HTN (hypertension)   . Osteoarthritis   . Palpitation     Family History  Problem Relation Age of Onset  . Hypothyroidism Mother   . Hyperlipidemia Other   . Kidney failure Father     Past Surgical History:  Procedure  Laterality Date  . ROTATOR CUFF REPAIR Left   . TUBAL LIGATION     Social History   Occupational History  . 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 Smoking status: Never Smoker  Substance and Sexual Activity  . Alcohol use: Not on file  . Drug use: Not on file  . Sexual activity: Not on file

## 2020-01-08 ENCOUNTER — Other Ambulatory Visit: Payer: Self-pay

## 2020-01-08 ENCOUNTER — Ambulatory Visit (INDEPENDENT_AMBULATORY_CARE_PROVIDER_SITE_OTHER): Payer: No Typology Code available for payment source | Admitting: Internal Medicine

## 2020-01-08 ENCOUNTER — Encounter: Payer: Self-pay | Admitting: Internal Medicine

## 2020-01-08 VITALS — BP 120/80 | HR 67 | Temp 98.2°F | Ht 62.0 in | Wt 207.6 lb

## 2020-01-08 DIAGNOSIS — G4700 Insomnia, unspecified: Secondary | ICD-10-CM

## 2020-01-08 DIAGNOSIS — I10 Essential (primary) hypertension: Secondary | ICD-10-CM | POA: Diagnosis not present

## 2020-01-08 DIAGNOSIS — M1A09X0 Idiopathic chronic gout, multiple sites, without tophus (tophi): Secondary | ICD-10-CM

## 2020-01-08 DIAGNOSIS — R5383 Other fatigue: Secondary | ICD-10-CM

## 2020-01-08 DIAGNOSIS — M797 Fibromyalgia: Secondary | ICD-10-CM | POA: Insufficient documentation

## 2020-01-08 DIAGNOSIS — Z23 Encounter for immunization: Secondary | ICD-10-CM | POA: Diagnosis not present

## 2020-01-08 DIAGNOSIS — F329 Major depressive disorder, single episode, unspecified: Secondary | ICD-10-CM

## 2020-01-08 DIAGNOSIS — M109 Gout, unspecified: Secondary | ICD-10-CM | POA: Insufficient documentation

## 2020-01-08 DIAGNOSIS — Z13820 Encounter for screening for osteoporosis: Secondary | ICD-10-CM | POA: Diagnosis not present

## 2020-01-08 DIAGNOSIS — F32A Depression, unspecified: Secondary | ICD-10-CM | POA: Insufficient documentation

## 2020-01-08 DIAGNOSIS — Z1231 Encounter for screening mammogram for malignant neoplasm of breast: Secondary | ICD-10-CM

## 2020-01-08 DIAGNOSIS — R0683 Snoring: Secondary | ICD-10-CM | POA: Diagnosis not present

## 2020-01-08 NOTE — Patient Instructions (Signed)
-  Nice seeing you today!!  -Lab work today; will notify you once results are available.  -Tdap and pneumonia vaccines today.  -Remember to get your flu vaccine at work.  -Mammogram requested.  -sleep study has been requested.  -Schedule follow up in 6 months or sooner as needed.

## 2020-01-08 NOTE — Addendum Note (Signed)
Addended by: Marrion Coy on: 01/08/2020 08:26 AM   Modules accepted: Orders

## 2020-01-08 NOTE — Addendum Note (Signed)
Addended by: Westley Hummer B on: 01/08/2020 04:25 PM   Modules accepted: Orders

## 2020-01-08 NOTE — Progress Notes (Signed)
New Patient Office Visit     This visit occurred during the SARS-CoV-2 public health emergency.  Safety protocols were in place, including screening questions prior to the visit, additional usage of staff PPE, and extensive cleaning of exam room while observing appropriate contact time as indicated for disinfecting solutions.    CC/Reason for Visit: Establish care, discuss chronic conditions, discuss an acute concern Previous PCP: Unknown Last Visit: May 2021  HPI: Moon Budde is a 69 y.o. female who is coming in today for the above mentioned reasons. Past Medical History is significant for: Gout, hypertension, fibromyalgia, depression, insomnia.  She is being followed by rheumatology for a positive rheumatoid factor.  She recently had in May a right shoulder rotator cuff and biceps tendon repair.  She has been complaining of a lot of snoring as well as daytime fatigue.  She has a lot of allergies and nasal congestion and manages this with a daily antihistamine and fluticasone nasal spray.  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  Her father had dementia and end-stage renal disease.  She had a colonoscopy in 2015 and is a 10-year callback.  She is overdue for mammogram.  She is due for Tdap and Pneumovax.   Past Medical/Surgical History: Past Medical History:  Diagnosis Date  . Depression   . Fibromyalgia   . Gout   . HTN (hypertension)   . Insomnia   . Morbid obesity ([place])   . Osteoarthritis   . Palpitation     Past Surgical History:  Procedure Laterality Date  . right rotator cuff    . ROTATOR CUFF REPAIR Left   . TUBAL LIGATION      Social History:  reports that she has never smoked. She has never used smokeless tobacco. She reports that she does not drink alcohol and does not use drugs.  Allergies: Allergies  Allergen Reactions  . Biaxin [Clarithromycin] Rash  . Ceftin [Cefuroxime Axetil] Rash  . Nsaids  Nausea And Vomiting  . Percocet [Oxycodone-Acetaminophen] Nausea And Vomiting    Pt. Also states it makes her hallucinate  . Ultram [Tramadol] Nausea And Vomiting    Family History:  Family History  Problem Relation Age of Onset  . Hypothyroidism Mother   . Hyperlipidemia Other   . Kidney failure Father   . Dementia Father      Current Outpatient Medications:  .  acetaminophen (TYLENOL) 500 MG tablet, Take 500 mg by mouth every 6 (six) hours as needed., Disp: , Rfl:  .  allopurinol (ZYLOPRIM) 100 MG tablet, Take 100 mg by mouth. 2 tabs daily, Disp: , Rfl:  .  amLODipine (NORVASC) 5 MG tablet, Take 5 mg by mouth daily., Disp: , Rfl:  .  celecoxib (CELEBREX) 200 MG capsule, Take 200 mg by mouth 2 (two) times daily., Disp: , Rfl:  .  diclofenac Sodium (VOLTAREN) 1 % GEL, Apply topically 4 (four) times daily., Disp: , Rfl:  .  DULoxetine (CYMBALTA) 30 MG capsule, Take 30 mg by mouth daily., Disp: , Rfl:  .  eszopiclone (LUNESTA) 2 MG TABS tablet, Take 2 mg by mouth at bedtime as needed for sleep. Take immediately before bedtime, Disp: , Rfl:  .  fluticasone (FLONASE) 50 MCG/ACT nasal spray, Place into both nostrils daily., Disp: , Rfl:  .  loratadine (CLARITIN) 10 MG tablet, Take 10 mg by mouth daily., Disp: , Rfl:  .  losartan (COZAAR) 50 MG tablet, Take  50 mg by mouth daily., Disp: , Rfl:   Review of Systems:  Constitutional: Denies fever, chills, diaphoresis, appetite change. HEENT: Denies photophobia, eye pain, redness, hearing loss, ear pain, congestion, sore throat, rhinorrhea, sneezing, mouth sores, trouble swallowing, neck pain, neck stiffness and tinnitus.   Respiratory: Denies SOB, DOE, cough, chest tightness,  and wheezing.   Cardiovascular: Denies chest pain, palpitations and leg swelling.  Gastrointestinal: Denies nausea, vomiting, abdominal pain, diarrhea, constipation, blood in stool and abdominal distention.  Genitourinary: Denies dysuria, urgency, frequency, hematuria,  flank pain and difficulty urinating.  Endocrine: Denies: hot or cold intolerance, sweats, changes in hair or nails, polyuria, polydipsia. Musculoskeletal: Denies myalgias, back pain, joint swelling, arthralgias and gait problem.  Skin: Denies pallor, rash and wound.  Neurological: Denies dizziness, seizures, syncope, weakness, light-headedness, numbness and headaches.  Hematological: Denies adenopathy. Easy bruising, personal or family bleeding history  Psychiatric/Behavioral: Denies suicidal ideation, mood changes, confusion, nervousness, sleep disturbance and agitation    Physical Exam: Vitals:   01/08/20 0733  BP: 120/80  Pulse: 67  Temp: 98.2 F (36.8 C)  TempSrc: Oral  SpO2: 97%  Weight: 207 lb 9.6 oz (94.2 kg)  Height: 5\' 2"  (1.575 m)   Body mass index is 37.97 kg/m.   Constitutional: NAD, calm, comfortable Eyes: PERRL, lids and conjunctivae normal, wears corrective lenses ENMT: Mucous membranes are moist. Posterior pharynx clear of any exudate or lesions. Normal dentition.  Respiratory: clear to auscultation bilaterally, no wheezing, no crackles. Normal respiratory effort. No accessory muscle use.  Cardiovascular: Regular rate and rhythm, no murmurs / rubs / gallops. No extremity edema. Neurologic: Grossly intact and nonfocal Psychiatric: Normal judgment and insight. Alert and oriented x 3. Normal mood.    Impression and Plan:  Screening for osteoporosis  - Plan: DG Bone Density  Loud snoring  - Plan: Ambulatory referral to Neurology for sleep study  Primary hypertension -Blood pressure is well controlled currently on losartan 50 mg and amlodipine 5 mg daily. -Check lipids today.  Morbid obesity ([place])  -Discussed healthy lifestyle, including increased physical activity and better food choices to promote weight loss.  Chronic gout of multiple sites, unspecified cause -Well-controlled on daily allopurinol.  Fibromyalgia -Followed by rheumatology, on  Cymbalta.  Reactive depression  -Well-controlled on Cymbalta.  Insomnia, unspecified type -Has to take daily eszopiclone 2 mg at bedtime.  Snores Fatigue, unspecified type -Suspicious for sleep apnea, refer for sleep study. -Also check TSH, vitamin B12 and vitamin D.  Need for Tdap vaccination -Tdap administered today.  Encounter for screening mammogram for malignant neoplasm of breast -Mammogram requested.    Patient Instructions  -Nice seeing you today!!  -Lab work today; will notify you once results are available.  -Tdap and pneumonia vaccines today.  -Remember to get your flu vaccine at work.  -Mammogram requested.  -sleep study has been requested.  -Schedule follow up in 6 months or sooner as needed.     Lelon Frohlich, MD [facility]

## 2020-01-09 ENCOUNTER — Other Ambulatory Visit: Payer: Self-pay | Admitting: Internal Medicine

## 2020-01-09 ENCOUNTER — Encounter: Payer: Self-pay | Admitting: Internal Medicine

## 2020-01-09 DIAGNOSIS — E785 Hyperlipidemia, unspecified: Secondary | ICD-10-CM | POA: Insufficient documentation

## 2020-01-09 DIAGNOSIS — E559 Vitamin D deficiency, unspecified: Secondary | ICD-10-CM | POA: Insufficient documentation

## 2020-01-09 DIAGNOSIS — N183 Chronic kidney disease, stage 3 unspecified: Secondary | ICD-10-CM | POA: Insufficient documentation

## 2020-01-09 LAB — CBC WITH DIFFERENTIAL/PLATELET
Absolute Monocytes: 432 cells/uL (ref 200–950)
Basophils Absolute: 32 cells/uL (ref 0–200)
Basophils Relative: 0.6 %
Eosinophils Absolute: 211 cells/uL (ref 15–500)
Eosinophils Relative: 3.9 %
HCT: 36.4 % (ref 35.0–45.0)
Hemoglobin: 11.8 g/dL (ref 11.7–15.5)
Lymphs Abs: 1944 cells/uL (ref 850–3900)
MCH: 29.4 pg (ref 27.0–33.0)
MCHC: 32.4 g/dL (ref 32.0–36.0)
MCV: 90.5 fL (ref 80.0–100.0)
MPV: 10.3 fL (ref 7.5–12.5)
Monocytes Relative: 8 %
Neutro Abs: 2781 cells/uL (ref 1500–7800)
Neutrophils Relative %: 51.5 %
Platelets: 334 10*3/uL (ref 140–400)
RBC: 4.02 10*6/uL (ref 3.80–5.10)
RDW: 13 % (ref 11.0–15.0)
Total Lymphocyte: 36 %
WBC: 5.4 10*3/uL (ref 3.8–10.8)

## 2020-01-09 LAB — LIPID PANEL
Cholesterol: 233 mg/dL — ABNORMAL HIGH (ref <200)
HDL: 72 mg/dL (ref >=50)
LDL Cholesterol (Calc): 145 mg/dL (calc) — ABNORMAL HIGH
Non-HDL Cholesterol (Calc): 161 mg/dL (calc) — ABNORMAL HIGH (ref <130)
Total CHOL/HDL Ratio: 3.2 (calc) (ref <5.0)
Triglycerides: 67 mg/dL (ref <150)

## 2020-01-09 LAB — COMPREHENSIVE METABOLIC PANEL
AG Ratio: 1.6 (calc) (ref 1.0–2.5)
ALT: 9 U/L (ref 6–29)
AST: 13 U/L (ref 10–35)
Albumin: 4.1 g/dL (ref 3.6–5.1)
Alkaline phosphatase (APISO): 93 U/L (ref 37–153)
BUN/Creatinine Ratio: 19 (calc) (ref 6–22)
BUN: 22 mg/dL (ref 7–25)
CO2: 27 mmol/L (ref 20–32)
Calcium: 9.3 mg/dL (ref 8.6–10.4)
Chloride: 106 mmol/L (ref 98–110)
Creat: 1.13 mg/dL — ABNORMAL HIGH (ref 0.50–0.99)
Globulin: 2.6 g/dL (calc) (ref 1.9–3.7)
Glucose, Bld: 86 mg/dL (ref 65–99)
Potassium: 3.9 mmol/L (ref 3.5–5.3)
Sodium: 142 mmol/L (ref 135–146)
Total Bilirubin: 0.4 mg/dL (ref 0.2–1.2)
Total Protein: 6.7 g/dL (ref 6.1–8.1)

## 2020-01-09 LAB — VITAMIN D 25 HYDROXY (VIT D DEFICIENCY, FRACTURES): Vit D, 25-Hydroxy: 16 ng/mL — ABNORMAL LOW (ref 30–100)

## 2020-01-09 LAB — VITAMIN B12: Vitamin B-12: 320 pg/mL (ref 200–1100)

## 2020-01-09 LAB — TSH: TSH: 2.4 mIU/L (ref 0.40–4.50)

## 2020-01-09 MED ORDER — VITAMIN D (ERGOCALCIFEROL) 1.25 MG (50000 UNIT) PO CAPS: 50000.0000 [IU] | ORAL_CAPSULE | ORAL | 0 refills | Status: AC

## 2020-01-14 ENCOUNTER — Telehealth: Payer: Self-pay | Admitting: *Deleted

## 2020-01-14 ENCOUNTER — Other Ambulatory Visit: Payer: Self-pay | Admitting: Internal Medicine

## 2020-01-14 ENCOUNTER — Encounter: Payer: Self-pay | Admitting: Internal Medicine

## 2020-01-14 DIAGNOSIS — E559 Vitamin D deficiency, unspecified: Secondary | ICD-10-CM

## 2020-01-14 NOTE — Telephone Encounter (Signed)
Patient noticed Reactive depression on her last office visit summary and would like more information about what it is and why it is on her summary.  Okay to call or send a [employer].

## 2020-01-15 ENCOUNTER — Other Ambulatory Visit: Payer: Self-pay

## 2020-01-15 ENCOUNTER — Ambulatory Visit (INDEPENDENT_AMBULATORY_CARE_PROVIDER_SITE_OTHER)
Admission: RE | Admit: 2020-01-15 | Discharge: 2020-01-15 | Disposition: A | Discharge status: Home / Self Care | Payer: No Typology Code available for payment source | Source: Ambulatory Visit | Attending: Internal Medicine | Admitting: Internal Medicine

## 2020-01-15 ENCOUNTER — Encounter: Payer: Self-pay | Admitting: Internal Medicine

## 2020-01-15 ENCOUNTER — Other Ambulatory Visit: Payer: Self-pay | Admitting: Internal Medicine

## 2020-01-15 DIAGNOSIS — Z13820 Encounter for screening for osteoporosis: Secondary | ICD-10-CM

## 2020-01-15 DIAGNOSIS — N1831 Chronic kidney disease, stage 3a: Secondary | ICD-10-CM

## 2020-01-15 NOTE — Telephone Encounter (Signed)
Addressed in a message to her.

## 2020-01-17 ENCOUNTER — Telehealth: Payer: Self-pay | Admitting: Internal Medicine

## 2020-01-17 NOTE — Telephone Encounter (Signed)
Pt called to check the status of her neurology and nephrology referral stated that is has been over a week.  Pt would like to have a call back with information.

## 2020-01-22 NOTE — Telephone Encounter (Signed)
Referrals were processed and sent to specialty offices for scheduling. The offices will call patient with appointment date and time.

## 2020-01-22 NOTE — Progress Notes (Signed)
Office Visit Note  [name]             Date of Birth: 01/16/1951           MRN: [id number]             PCP: Isaac Bliss, [name], MD Referring: Gavin Pound, MD Visit Date: 02/05/2020 Occupation: @GUAROCC @  Subjective:  New Patient (Initial Visit) (Abnormal labs, gout, fibromyalgia)   History of Present Illness: Amy Hooper is a 69 y.o. female with history of osteoarthritis, degenerative disc disease and fibromyalgia.  She has been seen in consultation per request of her PCP for evaluation of positive rheumatoid factor.  She states her symptoms a started in her 35s and 82s with knee joint pain and discomfort.  She has been under care of orthopedics since then.  She states for many years she took Celebrex which she continues to take.  She had arthroscopic surgery in the past.  She has had cortisone injections.  The Visco supplement injections were not covered by her insurance.  She states now she is seeing Dr.Xu was recommending bilateral total knee replacement in June 2022.  She has been also under care of Dr. Mina Marble at [facility] was given injections to her lumbar spine for degenerative disc disease.  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  She has had arthroscopic surgery on her bilateral shoulders.  She states she had right rotator cuff repair they have 2021.  She complains of pain and stiffness in her hands and feet for the last 3 years.  She states she has not seen any joint swelling but her hands and feet cramps at times.  She was diagnosed with gout about 2 years ago by Dr. Gavin Pound and has not had any flares of gout since she has been on allopurinol 200 mg a day.  She states the gout used to occur in her bilateral toes.  She recently had some lab work and her rheumatoid factor was positive for that reason she was  referred to me.  She also has history of fibromyalgia for several years which causes generalized pain and discomfort.  Due to change in her insurance she cannot see Dr. Trudie Reed anymore and is coming here for further treatment of gout.  She was recently diagnosed with osteopenia by her PCP based on her DEXA scan.  Activities of Daily Living:  Patient reports morning stiffness for 10 minutes.   Patient Reports nocturnal pain.  Difficulty dressing/grooming: Denies Difficulty climbing stairs: Reports Difficulty getting out of chair: Reports Difficulty using hands for taps, buttons, cutlery, and/or writing: Reports  Review of Systems  Constitutional: Positive for fatigue.  HENT: Negative for mouth dryness.   Eyes: Negative for dryness.  Respiratory: Negative for shortness of breath.   Cardiovascular: Negative for swelling in legs/feet.  Gastrointestinal: Negative for constipation.  Endocrine: Positive for cold intolerance and increased urination.  Genitourinary: Negative for difficulty urinating.  Musculoskeletal: Positive for arthralgias, gait problem, joint pain, morning stiffness and muscle tenderness.  Skin: Negative for rash.  Allergic/Immunologic: Negative for susceptible to infections.  Neurological: Positive for numbness.  Hematological: Positive for bruising/bleeding tendency.  Psychiatric/Behavioral: Positive for sleep disturbance.    PMFS History:  Patient Active Problem List   Diagnosis Date Noted  . DDD (degenerative disc disease), lumbar 02/05/2020  . Vitamin D deficiency 01/09/2020  .  Hyperlipidemia 01/09/2020  . CKD (chronic kidney disease) stage 3, GFR 30-59 ml/min (HCC) 01/09/2020  . HTN (hypertension)   . Morbid obesity ([place])   . Gout   . Fibromyalgia   . Depression   . Insomnia     Past Medical History:  Diagnosis Date  . Depression   . Fibromyalgia   . Gout   . HTN (hypertension)   . Insomnia   . Morbid obesity ([place])   . Osteoarthritis   . Palpitation      Family History  Problem Relation Age of Onset  . Hypothyroidism Mother   . Hyperlipidemia Other   . Kidney failure Father   . Dementia Father   . Gout Father   . Arthritis Father   . Prostate cancer Father    Past Surgical History:  Procedure Laterality Date  . KNEE ARTHROSCOPY    . right rotator cuff    . ROTATOR CUFF REPAIR Left   . TUBAL LIGATION     Social History   Social History Narrative   Lives gives with fiance.     Immunization History  Administered Date(s) Administered  . Moderna SARS-COVID-2 Vaccination 05/02/2019, 05/30/2019, 01/23/2020  . Pneumococcal Conjugate-13 06/05/2018  . Pneumococcal Polysaccharide-23 01/08/2020  . Tdap 01/08/2020     Objective: Vital Signs: BP 137/72 (BP Location: Right Arm, Patient Position: Sitting, Cuff Size: Normal)   Pulse 62   Resp 16   Ht 5\' 2"  (1.575 m)   Wt 211 lb (95.7 kg)   BMI 38.59 kg/m    Physical Exam Vitals and nursing note reviewed.  Constitutional:      Appearance: She is well-developed.  HENT:     Head: Normocephalic and atraumatic.  Eyes:     Conjunctiva/sclera: Conjunctivae normal.  Cardiovascular:     Rate and Rhythm: Normal rate and regular rhythm.     Heart sounds: Normal heart sounds.  Pulmonary:     Effort: Pulmonary effort is normal.     Breath sounds: Normal breath sounds.  Abdominal:     General: Bowel sounds are normal.     Palpations: Abdomen is soft.  Musculoskeletal:     Cervical back: Normal range of motion.  Lymphadenopathy:     Cervical: No cervical adenopathy.  Skin:    General: Skin is warm and dry.     Capillary Refill: Capillary refill takes less than 2 seconds.  Neurological:     Mental Status: She is alert and oriented to person, place, and time.  Psychiatric:        Behavior: Behavior normal.      Musculoskeletal Exam: C-spine with good range of motion.  Shoulder joints with good range of motion with some discomfort.  Elbow joints and wrist joints with good  range of motion.  She had no MCP PIP or DIP thickening or swelling.  Hip joints with good range of motion.  She had tenderness over bilateral trochanteric bursa.  Knee joints with good range of motion without any warmth swelling effusion.  She has no tenderness over ankles or MTPs.  No synovitis was noted.  She generalized hyperalgesia and positive tender points.  CDAI Exam: CDAI Score: -- Patient Global: --; Provider Global: -- Swollen: --; Tender: -- Joint Exam 02/05/2020   No joint exam has been documented for this visit   There is currently no information documented on the homunculus. Go to the Rheumatology activity and complete the homunculus joint exam.  Investigation: No additional findings.  Imaging: DG Bone  Density  Result Date: 01/15/2020 Date of study: 01/15/2020 Exam: DUAL X-RAY ABSORPTIOMETRY (DXA) FOR BONE MINERAL DENSITY (BMD) Instrument: [employer] Requesting Provider: PCP Indication: Screening for low BMD Comparison: none (please note that it is not possible to compare data from different instruments) Clinical data: Pt is a 69 y.o. female without previous history of fracture. On vitamin D. Results:  Lumbar spine L1-L4 Femoral neck (FN) 33% distal radius T-score -0.3 RFN: -1.7 LFN: -1.2 n/a Assessment: the BMD is low according to the [facility] classification for osteoporosis (see below). Fracture risk: moderate FRAX score: 10 year major osteoporotic risk: 3.9%. 10 year hip fracture risk: 0.5%. The thresholds for treatment are 20% and 3%, respectively. Comments: the technical quality of the study is good. Evaluation for secondary causes should be considered if clinically indicated. Recommend optimizing calcium (1200 mg/day) and vitamin D (800 IU/day) intake. Followup: Repeat BMD is appropriate after 2 years. WHO criteria for diagnosis of osteoporosis in postmenopausal women and in men 60 y/o or older: - normal: T-score -1.0 to + 1.0 - osteopenia/low bone density: T-score between  -2.5 and -1.0 - osteoporosis: T-score below -2.5 - severe osteoporosis: T-score below -2.5 with history of fragility fracture Note: although not part of the WHO classification, the presence of a fragility fracture, regardless of the T-score, should be considered diagnostic of osteoporosis, provided other causes for the fracture have been excluded. Treatment: The National Osteoporosis Foundation recommends that treatment be considered in postmenopausal women and men age 56 or older with: 1. Hip or vertebral (clinical or morphometric) fracture 2. T-score of - 2.5 or lower at the spine or hip 3. 10-year fracture probability by FRAX of at least 20% for a major osteoporotic fracture and 3% for a hip fracture Philemon Kingdom, MD [facility]    Recent Labs: Lab Results  Component Value Date   WBC 5.4 01/08/2020   HGB 11.8 01/08/2020   PLT 334 01/08/2020   NA 142 01/08/2020   K 3.9 01/08/2020   CL 106 01/08/2020   CO2 27 01/08/2020   GLUCOSE 86 01/08/2020   BUN 22 01/08/2020   CREATININE 1.13 (H) 01/08/2020   BILITOT 0.4 01/08/2020   AST 13 01/08/2020   ALT 9 01/08/2020   PROT 6.7 01/08/2020   CALCIUM 9.3 01/08/2020    Speciality Comments: No specialty comments available.  Procedures:  No procedures performed Allergies: Cefuroxime axetil, Clarithromycin, Nsaids, Percocet [oxycodone-acetaminophen], Hydrocodone-acetaminophen, Other, Oxycodone-acetaminophen, and Tramadol   Assessment / Plan:     Visit Diagnoses: Rheumatoid factor positive-patient states that she was diagnosed with positive rheumatoid factor several years ago.  She had a rheumatology work-up by Dr. Trudie Reed and was not diagnosed with rheumatoid arthritis.  Chronic pain of both shoulders-she has been diagnosed with the rotator cuff tear of bilateral shoulders.  She states she had right rotator cuff tear surgery in May 2021.  She is chronic discomfort in her bilateral shoulders.  Pain in both hands-she complains of  muscle cramps in her hands.  No warmth swelling or synovitis was noted on my examination today.  A handout on hand exercises was given.  Primary osteoarthritis of both knees-patient states that she has been diagnosed with end-stage osteoarthritis of her knee joints.  She is a scheduled to have total knee replacement in 2022 by Dr.Xu.  A handout on knee exercises was given.  Pain in both feet -she complains of cramps in her feet.  No swelling or tenderness was noted on examination today.  She states  she is to have a lot of discomfort before she started on allopurinol.  I will obtain additional labs today.  Plan: Sedimentation rate, Rheumatoid factor, Cyclic citrul peptide antibody, IgG, Uric acid  DDD (degenerative disc disease), lumbar-she was diagnosed with disc disease at [facility].  She has had cortisone injections to her lumbar spine by Dr. Mina Marble.  Idiopathic chronic gout of multiple sites without tophus - Allopurinol 100 mg 2 tablets by mouth daily.  Patient denies having any gout flare in a long time.  She states she has been doing well on allopurinol.  Medication monitoring encounter -she still takes Celebrex to manage her pain symptoms.  There is mention of renal insufficiency in the chart.  I discouraged the use of Celebrex.  Plan: CBC with Differential/Platelet, COMPLETE METABOLIC PANEL WITH GFR  Fibromyalgia-she has generalized pain and discomfort from fibromyalgia.  She has several positive tender points.  Other fatigue - Plan: CK, TSH  Primary hypertension-blood pressure is well controlled.  History of hyperlipidemia  History of chronic kidney disease  Other insomnia  Osteopenia of multiple sites-she had recent bone density which is consistent with osteopenia.  Use of calcium, vitamin D and exercises were discussed.  Vitamin D deficiency  History of depression  Orders: Orders Placed This Encounter  Procedures  . CBC with Differential/Platelet  . COMPLETE  METABOLIC PANEL WITH GFR  . Sedimentation rate  . CK  . TSH  . Rheumatoid factor  . Cyclic citrul peptide antibody, IgG  . Uric acid   No orders of the defined types were placed in this encounter.    Follow-Up Instructions: Return for Osteoarthritis, Gout.   Bo Merino, MD  Note - This record has been created using Editor, commissioning.  Chart creation errors have been sought, but may not always  have been located. Such creation errors do not reflect on  the standard of medical care.

## 2020-02-05 ENCOUNTER — Encounter: Payer: Self-pay | Admitting: Rheumatology

## 2020-02-05 ENCOUNTER — Ambulatory Visit (INDEPENDENT_AMBULATORY_CARE_PROVIDER_SITE_OTHER): Payer: No Typology Code available for payment source | Admitting: Rheumatology

## 2020-02-05 ENCOUNTER — Other Ambulatory Visit: Payer: Self-pay

## 2020-02-05 VITALS — BP 137/72 | HR 62 | Resp 16 | Ht 62.0 in | Wt 211.0 lb

## 2020-02-05 DIAGNOSIS — R768 Other specified abnormal immunological findings in serum: Secondary | ICD-10-CM | POA: Diagnosis not present

## 2020-02-05 DIAGNOSIS — Z8639 Personal history of other endocrine, nutritional and metabolic disease: Secondary | ICD-10-CM

## 2020-02-05 DIAGNOSIS — M5136 Other intervertebral disc degeneration, lumbar region: Secondary | ICD-10-CM

## 2020-02-05 DIAGNOSIS — M51369 Other intervertebral disc degeneration, lumbar region without mention of lumbar back pain or lower extremity pain: Secondary | ICD-10-CM

## 2020-02-05 DIAGNOSIS — M79641 Pain in right hand: Secondary | ICD-10-CM | POA: Diagnosis not present

## 2020-02-05 DIAGNOSIS — G4709 Other insomnia: Secondary | ICD-10-CM

## 2020-02-05 DIAGNOSIS — Z5181 Encounter for therapeutic drug level monitoring: Secondary | ICD-10-CM

## 2020-02-05 DIAGNOSIS — Z87448 Personal history of other diseases of urinary system: Secondary | ICD-10-CM

## 2020-02-05 DIAGNOSIS — Z8659 Personal history of other mental and behavioral disorders: Secondary | ICD-10-CM

## 2020-02-05 DIAGNOSIS — M170 Bilateral primary osteoarthritis of knee: Secondary | ICD-10-CM | POA: Diagnosis not present

## 2020-02-05 DIAGNOSIS — M79642 Pain in left hand: Secondary | ICD-10-CM

## 2020-02-05 DIAGNOSIS — M25511 Pain in right shoulder: Secondary | ICD-10-CM

## 2020-02-05 DIAGNOSIS — M25512 Pain in left shoulder: Secondary | ICD-10-CM

## 2020-02-05 DIAGNOSIS — M1A09X0 Idiopathic chronic gout, multiple sites, without tophus (tophi): Secondary | ICD-10-CM

## 2020-02-05 DIAGNOSIS — M79671 Pain in right foot: Secondary | ICD-10-CM

## 2020-02-05 DIAGNOSIS — I10 Essential (primary) hypertension: Secondary | ICD-10-CM

## 2020-02-05 DIAGNOSIS — R5383 Other fatigue: Secondary | ICD-10-CM

## 2020-02-05 DIAGNOSIS — E559 Vitamin D deficiency, unspecified: Secondary | ICD-10-CM

## 2020-02-05 DIAGNOSIS — G8929 Other chronic pain: Secondary | ICD-10-CM

## 2020-02-05 DIAGNOSIS — Z981 Arthrodesis status: Secondary | ICD-10-CM | POA: Insufficient documentation

## 2020-02-05 DIAGNOSIS — M797 Fibromyalgia: Secondary | ICD-10-CM

## 2020-02-05 DIAGNOSIS — M8949 Other hypertrophic osteoarthropathy, multiple sites: Secondary | ICD-10-CM

## 2020-02-05 DIAGNOSIS — M79672 Pain in left foot: Secondary | ICD-10-CM

## 2020-02-05 DIAGNOSIS — M8589 Other specified disorders of bone density and structure, multiple sites: Secondary | ICD-10-CM

## 2020-02-05 NOTE — Patient Instructions (Signed)
Journal for Nurse Practitioners, 15(4), 263-267. Retrieved December 26, 2017 from [email or web address].com/[email or web address]">  Knee Exercises Ask your health care provider which exercises are safe for you. Do exercises exactly as told by your health care provider and adjust them as directed. It is normal to feel mild stretching, pulling, tightness, or discomfort as you do these exercises. Stop right away if you feel sudden pain or your pain gets worse. Do not begin these exercises until told by your health care provider. Stretching and range-of-motion exercises These exercises warm up your muscles and joints and improve the movement and flexibility of your knee. These exercises also help to relieve pain and swelling. Knee extension, prone 1. Lie on your abdomen (prone position) on a bed. 2. Place your left / right knee just beyond the edge of the surface so your knee is not on the bed. You can put a towel under your left / right thigh just above your kneecap for comfort. 3. Relax your leg muscles and allow gravity to straighten your knee (extension). You should feel a stretch behind your left / right knee. 4. Hold this position for __________ seconds. 5. Scoot up so your knee is supported between repetitions. Repeat __________ times. Complete this exercise __________ times a day. Knee flexion, active  1. Lie on your back with both legs straight. If this causes back discomfort, bend your left / right knee so your foot is flat on the floor. 2. Slowly slide your left / right heel back toward your buttocks. Stop when you feel a gentle stretch in the front of your knee or thigh (flexion). 3. Hold this position for __________ seconds. 4. Slowly slide your left / right heel back to the starting position. Repeat __________ times. Complete this exercise __________ times a day. Quadriceps stretch, prone  1. Lie on your abdomen on a firm surface, such as a bed or padded floor. 2. Bend your left / right knee and hold  your ankle. If you cannot reach your ankle or pant leg, loop a belt around your foot and grab the belt instead. 3. Gently pull your heel toward your buttocks. Your knee should not slide out to the side. You should feel a stretch in the front of your thigh and knee (quadriceps). 4. Hold this position for __________ seconds. Repeat __________ times. Complete this exercise __________ times a day. Hamstring, supine 1. Lie on your back (supine position). 2. Loop a belt or towel over the ball of your left / right foot. The ball of your foot is on the walking surface, right under your toes. 3. Straighten your left / right knee and slowly pull on the belt to raise your leg until you feel a gentle stretch behind your knee (hamstring). ? Do not let your knee bend while you do this. ? Keep your other leg flat on the floor. 4. Hold this position for __________ seconds. Repeat __________ times. Complete this exercise __________ times a day. Strengthening exercises These exercises build strength and endurance in your knee. Endurance is the ability to use your muscles for a long time, even after they get tired. Quadriceps, isometric This exercise stretches the muscles in front of your thigh (quadriceps) without moving your knee joint (isometric). 1. Lie on your back with your left / right leg extended and your other knee bent. Put a rolled towel or small pillow under your knee if told by your health care provider. 2. Slowly tense the muscles in the front of your left /   right thigh. You should see your kneecap slide up toward your hip or see increased dimpling just above the knee. This motion will push the back of the knee toward the floor. 3. For __________ seconds, hold the muscle as tight as you can without increasing your pain. 4. Relax the muscles slowly and completely. Repeat __________ times. Complete this exercise __________ times a day. Straight leg raises This exercise stretches the muscles in front  of your thigh (quadriceps) and the muscles that move your hips (hip flexors). 1. Lie on your back with your left / right leg extended and your other knee bent. 2. Tense the muscles in the front of your left / right thigh. You should see your kneecap slide up or see increased dimpling just above the knee. Your thigh may even shake a bit. 3. Keep these muscles tight as you raise your leg 4-6 inches (10-15 cm) off the floor. Do not let your knee bend. 4. Hold this position for __________ seconds. 5. Keep these muscles tense as you lower your leg. 6. Relax your muscles slowly and completely after each repetition. Repeat __________ times. Complete this exercise __________ times a day. Hamstring, isometric 1. Lie on your back on a firm surface. 2. Bend your left / right knee about __________ degrees. 3. Dig your left / right heel into the surface as if you are trying to pull it toward your buttocks. Tighten the muscles in the back of your thighs (hamstring) to "dig" as hard as you can without increasing any pain. 4. Hold this position for __________ seconds. 5. Release the tension gradually and allow your muscles to relax completely for __________ seconds after each repetition. Repeat __________ times. Complete this exercise __________ times a day. Hamstring curls If told by your health care provider, do this exercise while wearing ankle weights. Begin with __________ lb weights. Then increase the weight by 1 lb (0.5 kg) increments. Do not wear ankle weights that are more than __________ lb. 1. Lie on your abdomen with your legs straight. 2. Bend your left / right knee as far as you can without feeling pain. Keep your hips flat against the floor. 3. Hold this position for __________ seconds. 4. Slowly lower your leg to the starting position. Repeat __________ times. Complete this exercise __________ times a day. Squats This exercise strengthens the muscles in front of your thigh and knee  (quadriceps). 1. Stand in front of a table, with your feet and knees pointing straight ahead. You may rest your hands on the table for balance but not for support. 2. Slowly bend your knees and lower your hips like you are going to sit in a chair. ? Keep your weight over your heels, not over your toes. ? Keep your lower legs upright so they are parallel with the table legs. ? Do not let your hips go lower than your knees. ? Do not bend lower than told by your health care provider. ? If your knee pain increases, do not bend as low. 3. Hold the squat position for __________ seconds. 4. Slowly push with your legs to return to standing. Do not use your hands to pull yourself to standing. Repeat __________ times. Complete this exercise __________ times a day. Wall slides This exercise strengthens the muscles in front of your thigh and knee (quadriceps). 1. Lean your back against a smooth wall or door, and walk your feet out 18-24 inches (46-61 cm) from it. 2. Place your feet hip-width apart. 3.   Slowly slide down the wall or door until your knees bend __________ degrees. Keep your knees over your heels, not over your toes. Keep your knees in line with your hips. 4. Hold this position for __________ seconds. Repeat __________ times. Complete this exercise __________ times a day. Straight leg raises This exercise strengthens the muscles that rotate the leg at the hip and move it away from your body (hip abductors). 1. Lie on your side with your left / right leg in the top position. Lie so your head, shoulder, knee, and hip line up. You may bend your bottom knee to help you keep your balance. 2. Roll your hips slightly forward so your hips are stacked directly over each other and your left / right knee is facing forward. 3. Leading with your heel, lift your top leg 4-6 inches (10-15 cm). You should feel the muscles in your outer hip lifting. ? Do not let your foot drift forward. ? Do not let your knee  roll toward the ceiling. 4. Hold this position for __________ seconds. 5. Slowly return your leg to the starting position. 6. Let your muscles relax completely after each repetition. Repeat __________ times. Complete this exercise __________ times a day. Straight leg raises This exercise stretches the muscles that move your hips away from the front of the pelvis (hip extensors). 1. Lie on your abdomen on a firm surface. You can put a pillow under your hips if that is more comfortable. 2. Tense the muscles in your buttocks and lift your left / right leg about 4-6 inches (10-15 cm). Keep your knee straight as you lift your leg. 3. Hold this position for __________ seconds. 4. Slowly lower your leg to the starting position. 5. Let your leg relax completely after each repetition. Repeat __________ times. Complete this exercise __________ times a day. This information is not intended to replace advice given to you by your health care provider. Make sure you discuss any questions you have with your health care provider. Document Revised: 12/27/2017 Document Reviewed: 12/27/2017 Elsevier Patient Education  2020 [employer] Hand Exercises Hand exercises can be helpful for almost anyone. These exercises can strengthen the hands, improve flexibility and movement, and increase blood flow to the hands. These results can make work and daily tasks easier. Hand exercises can be especially helpful for people who have joint pain from arthritis or have nerve damage from overuse (carpal tunnel syndrome). These exercises can also help people who have injured a hand. Exercises Most of these hand exercises are gentle stretching and motion exercises. It is usually safe to do them often throughout the day. Warming up your hands before exercise may help to reduce stiffness. You can do this with gentle massage or by placing your hands in warm water for 10-15 minutes. It is normal to feel some stretching, pulling,  tightness, or mild discomfort as you begin new exercises. This will gradually improve. Stop an exercise right away if you feel sudden, severe pain or your pain gets worse. Ask your health care provider which exercises are best for you. Knuckle bend or "claw" fist 1. Stand or sit with your arm, hand, and all five fingers pointed straight up. Make sure to keep your wrist straight during the exercise. 2. Gently bend your fingers down toward your palm until the tips of your fingers are touching the top of your palm. Keep your big knuckle straight and just bend the small knuckles in your fingers. 3. Hold this position for   __________ seconds. 4. Straighten (extend) your fingers back to the starting position. Repeat this exercise 5-10 times with each hand. Full finger fist 1. Stand or sit with your arm, hand, and all five fingers pointed straight up. Make sure to keep your wrist straight during the exercise. 2. Gently bend your fingers into your palm until the tips of your fingers are touching the middle of your palm. 3. Hold this position for __________ seconds. 4. Extend your fingers back to the starting position, stretching every joint fully. Repeat this exercise 5-10 times with each hand. Straight fist 1. Stand or sit with your arm, hand, and all five fingers pointed straight up. Make sure to keep your wrist straight during the exercise. 2. Gently bend your fingers at the big knuckle, where your fingers meet your hand, and the middle knuckle. Keep the knuckle at the tips of your fingers straight and try to touch the bottom of your palm. 3. Hold this position for __________ seconds. 4. Extend your fingers back to the starting position, stretching every joint fully. Repeat this exercise 5-10 times with each hand. Tabletop 1. Stand or sit with your arm, hand, and all five fingers pointed straight up. Make sure to keep your wrist straight during the exercise. 2. Gently bend your fingers at the big  knuckle, where your fingers meet your hand, as far down as you can while keeping the small knuckles in your fingers straight. Think of forming a tabletop with your fingers. 3. Hold this position for __________ seconds. 4. Extend your fingers back to the starting position, stretching every joint fully. Repeat this exercise 5-10 times with each hand. Finger spread 1. Place your hand flat on a table with your palm facing down. Make sure your wrist stays straight as you do this exercise. 2. Spread your fingers and thumb apart from each other as far as you can until you feel a gentle stretch. Hold this position for __________ seconds. 3. Bring your fingers and thumb tight together again. Hold this position for __________ seconds. Repeat this exercise 5-10 times with each hand. Making circles 1. Stand or sit with your arm, hand, and all five fingers pointed straight up. Make sure to keep your wrist straight during the exercise. 2. Make a circle by touching the tip of your thumb to the tip of your index finger. 3. Hold for __________ seconds. Then open your hand wide. 4. Repeat this motion with your thumb and each finger on your hand. Repeat this exercise 5-10 times with each hand. Thumb motion 1. Sit with your forearm resting on a table and your wrist straight. Your thumb should be facing up toward the ceiling. Keep your fingers relaxed as you move your thumb. 2. Lift your thumb up as high as you can toward the ceiling. Hold for __________ seconds. 3. Bend your thumb across your palm as far as you can, reaching the tip of your thumb for the small finger (pinkie) side of your palm. Hold for __________ seconds. Repeat this exercise 5-10 times with each hand. Grip strengthening  1. Hold a stress ball or other soft ball in the middle of your hand. 2. Slowly increase the pressure, squeezing the ball as much as you can without causing pain. Think of bringing the tips of your fingers into the middle of  your palm. All of your finger joints should bend when doing this exercise. 3. Hold your squeeze for __________ seconds, then relax. Repeat this exercise 5-10 times with each   hand. Contact a health care provider if:  Your hand pain or discomfort gets much worse when you do an exercise.  Your hand pain or discomfort does not improve within 2 hours after you exercise. If you have any of these problems, stop doing these exercises right away. Do not do them again unless your health care provider says that you can. Get help right away if:  You develop sudden, severe hand pain or swelling. If this happens, stop doing these exercises right away. Do not do them again unless your health care provider says that you can. This information is not intended to replace advice given to you by your health care provider. Make sure you discuss any questions you have with your health care provider. Document Revised: 06/29/2018 Document Reviewed: 03/09/2018 Elsevier Patient Education  2020 [employer] Inc[employer]  

## 2020-02-06 LAB — URIC ACID: Uric Acid, Serum: 4.2 mg/dL (ref 2.5–7.0)

## 2020-02-06 LAB — CBC WITH DIFFERENTIAL/PLATELET
Absolute Monocytes: 420 cells/uL (ref 200–950)
Basophils Absolute: 28 cells/uL (ref 0–200)
Basophils Relative: 0.5 %
Eosinophils Absolute: 319 cells/uL (ref 15–500)
Eosinophils Relative: 5.7 %
HCT: 35.9 % (ref 35.0–45.0)
Hemoglobin: 11.7 g/dL (ref 11.7–15.5)
Lymphs Abs: 2117 cells/uL (ref 850–3900)
MCH: 29.3 pg (ref 27.0–33.0)
MCHC: 32.6 g/dL (ref 32.0–36.0)
MCV: 90 fL (ref 80.0–100.0)
MPV: 9.8 fL (ref 7.5–12.5)
Monocytes Relative: 7.5 %
Neutro Abs: 2716 cells/uL (ref 1500–7800)
Neutrophils Relative %: 48.5 %
Platelets: 344 10*3/uL (ref 140–400)
RBC: 3.99 10*6/uL (ref 3.80–5.10)
RDW: 13 % (ref 11.0–15.0)
Total Lymphocyte: 37.8 %
WBC: 5.6 10*3/uL (ref 3.8–10.8)

## 2020-02-06 LAB — RHEUMATOID FACTOR: Rheumatoid fact SerPl-aCnc: 20 IU/mL — ABNORMAL HIGH (ref <14)

## 2020-02-06 LAB — COMPLETE METABOLIC PANEL WITH GFR
AG Ratio: 1.8 (calc) (ref 1.0–2.5)
ALT: 10 U/L (ref 6–29)
AST: 13 U/L (ref 10–35)
Albumin: 4.2 g/dL (ref 3.6–5.1)
Alkaline phosphatase (APISO): 105 U/L (ref 37–153)
BUN/Creatinine Ratio: 15 (calc) (ref 6–22)
BUN: 17 mg/dL (ref 7–25)
CO2: 28 mmol/L (ref 20–32)
Calcium: 9.7 mg/dL (ref 8.6–10.4)
Chloride: 106 mmol/L (ref 98–110)
Creat: 1.17 mg/dL — ABNORMAL HIGH (ref 0.50–0.99)
GFR, Est African American: 56 mL/min/{1.73_m2} — ABNORMAL LOW (ref >=60)
GFR, Est Non African American: 49 mL/min/{1.73_m2} — ABNORMAL LOW (ref >=60)
Globulin: 2.4 g/dL (calc) (ref 1.9–3.7)
Glucose, Bld: 78 mg/dL (ref 65–99)
Potassium: 4.2 mmol/L (ref 3.5–5.3)
Sodium: 144 mmol/L (ref 135–146)
Total Bilirubin: 0.4 mg/dL (ref 0.2–1.2)
Total Protein: 6.6 g/dL (ref 6.1–8.1)

## 2020-02-06 LAB — SEDIMENTATION RATE: Sed Rate: 28 mm/h (ref 0–30)

## 2020-02-06 LAB — CK: Total CK: 58 U/L (ref 29–143)

## 2020-02-06 LAB — CYCLIC CITRUL PEPTIDE ANTIBODY, IGG: Cyclic Citrullin Peptide Ab: <16 UNITS

## 2020-02-06 LAB — TSH: TSH: 2.57 mIU/L (ref 0.40–4.50)

## 2020-02-06 NOTE — Progress Notes (Signed)
Creatinine is elevated.  Please advise patient to discontinue use of Celebrex or any other anti-inflammatories.  I will discuss the results at the follow-up visit.

## 2020-02-09 ENCOUNTER — Encounter: Payer: Self-pay | Admitting: Internal Medicine

## 2020-02-09 DIAGNOSIS — M5136 Other intervertebral disc degeneration, lumbar region: Secondary | ICD-10-CM

## 2020-02-16 ENCOUNTER — Encounter: Payer: Self-pay | Admitting: Rheumatology

## 2020-02-18 NOTE — Progress Notes (Signed)
Office Visit Note  [name]             Date of Birth: January 17, 1951           MRN: [id number]             PCP: Isaac Bliss, [name], MD Referring: Heywood Bene, * Visit Date: 03/03/2020 Occupation: @GUAROCC @  Subjective:  Follow-up (Worsening)   History of Present Illness: Amy Hooper is a 69 y.o. female with history of osteoarthritis and degenerative disc disease.  She has positive rheumatoid factor as well.  She continues to have pain and discomfort in her both shoulders, bilateral hands, knee joints and her feet.  She states her lower back pain is worse.  Due to insurance change she will be starting to see Dr. Ernestina Patches for lumbar spine injections.  She was taken off Celebrex due to elevation in her creatinine.  She states without Celebrex she is not able to manage her pain.  She states Tylenol is not effective.  Per her request I will refer her to pain management.  Activities of Daily Living:  Patient reports morning stiffness for 5 minutes.   Patient Reports nocturnal pain.  Difficulty dressing/grooming: Denies Difficulty climbing stairs: Reports Difficulty getting out of chair: Reports Difficulty using hands for taps, buttons, cutlery, and/or writing: Reports  Review of Systems  Constitutional: Negative for fatigue.  HENT: Negative for mouth dryness.   Eyes: Negative for dryness.  Respiratory: Positive for shortness of breath.   Cardiovascular: Negative for swelling in legs/feet.  Gastrointestinal: Negative for constipation.  Endocrine: Positive for cold intolerance.  Genitourinary: Negative for difficulty urinating.  Musculoskeletal: Positive for arthralgias, joint pain, joint swelling, morning stiffness and muscle tenderness.  Skin: Negative for rash.  Allergic/Immunologic: Negative for susceptible to infections.  Neurological: Negative for numbness.  Hematological: Negative for bruising/bleeding tendency.   Psychiatric/Behavioral: Positive for sleep disturbance.    PMFS History:  Patient Active Problem List   Diagnosis Date Noted  . Chronic pain of both shoulders 03/03/2020  . Pain in both hands 03/03/2020  . Primary osteoarthritis of both knees 03/03/2020  . Pain in both feet 03/03/2020  . History of chronic kidney disease 03/03/2020  . DDD (degenerative disc disease), lumbar 02/05/2020  . Vitamin D deficiency 01/09/2020  . Hyperlipidemia 01/09/2020  . CKD (chronic kidney disease) stage 3, GFR 30-59 ml/min (HCC) 01/09/2020  . HTN (hypertension)   . Morbid obesity ([place])   . Gout   . Fibromyalgia   . Depression   . Insomnia     Past Medical History:  Diagnosis Date  . Chronic kidney disease   . Depression   . Fibromyalgia   . Gout   . HTN (hypertension)   . Insomnia   . Morbid obesity ([place])   . Osteoarthritis   . Palpitation     Family History  Problem Relation Age of Onset  . Hypothyroidism Mother   . Hyperlipidemia Other   . Kidney failure Father   . Dementia Father   . Gout Father   . Arthritis Father   . Prostate cancer Father    Past Surgical History:  Procedure Laterality Date  . KNEE ARTHROSCOPY    . right rotator cuff    . ROTATOR CUFF REPAIR Left   . TUBAL LIGATION     Social History   Social History Narrative   Lives gives with fiance.     Immunization History  Administered Date(s) Administered  . Moderna  Sars-Covid-2 Vaccination 05/02/2019, 05/30/2019, 01/23/2020  . Pneumococcal Conjugate-13 06/05/2018  . Pneumococcal Polysaccharide-23 01/08/2020  . Tdap 01/08/2020     Objective: Vital Signs: BP (!) 151/75 (BP Location: Right Arm, Patient Position: Sitting, Cuff Size: Normal)   Pulse 60   Resp 16   Ht 5' 2"  (1.575 m)   Wt 211 lb 6.4 oz (95.9 kg)   BMI 38.67 kg/m    Physical Exam Vitals and nursing note reviewed.  Constitutional:      Appearance: She is well-developed and well-nourished.  HENT:     Head: Normocephalic and  atraumatic.  Eyes:     Extraocular Movements: EOM normal.     Conjunctiva/sclera: Conjunctivae normal.  Cardiovascular:     Rate and Rhythm: Normal rate and regular rhythm.     Pulses: Intact distal pulses.     Heart sounds: Normal heart sounds.  Pulmonary:     Effort: Pulmonary effort is normal.     Breath sounds: Normal breath sounds.  Abdominal:     General: Bowel sounds are normal.     Palpations: Abdomen is soft.  Musculoskeletal:     Cervical back: Normal range of motion.  Lymphadenopathy:     Cervical: No cervical adenopathy.  Skin:    General: Skin is warm and dry.     Capillary Refill: Capillary refill takes less than 2 seconds.  Neurological:     Mental Status: She is alert and oriented to person, place, and time.  Psychiatric:        Mood and Affect: Mood and affect normal.        Behavior: Behavior normal.      Musculoskeletal Exam: C-spine was in good range of motion.  She has limited painful range of motion for lumbar spine.  She has lot of difficulty with mobility due to lower back pain.  Shoulder joints, elbow joints, wrist joints with good range of motion.  She has no PIP and DIP thickening or synovitis.  She has some discomfort range of motion for knee joints.  There was tenderness across MTPs but no synovitis was noted.  CDAI Exam: CDAI Score: -- Patient Global: --; Provider Global: -- Swollen: --; Tender: -- Joint Exam 03/03/2020   No joint exam has been documented for this visit   There is currently no information documented on the homunculus. Go to the Rheumatology activity and complete the homunculus joint exam.  Investigation: No additional findings.  Imaging: No results found.  Recent Labs: Lab Results  Component Value Date   WBC 5.6 02/05/2020   HGB 11.7 02/05/2020   PLT 344 02/05/2020   NA 144 02/05/2020   K 4.2 02/05/2020   CL 106 02/05/2020   CO2 28 02/05/2020   GLUCOSE 78 02/05/2020   BUN 17 02/05/2020   CREATININE 1.17 (H)  02/05/2020   BILITOT 0.4 02/05/2020   AST 13 02/05/2020   ALT 10 02/05/2020   PROT 6.6 02/05/2020   CALCIUM 9.7 02/05/2020   GFRAA 56 (L) 02/05/2020   February 05, 2020 ESR 28, CK 58, TSH normal, RF 20, anti-CCP negative, uric acid 4.2  Speciality Comments: No specialty comments available.  Procedures:  No procedures performed Allergies: Cefuroxime axetil, Clarithromycin, Nsaids, Percocet [oxycodone-acetaminophen], Hydrocodone-acetaminophen, Other, Oxycodone-acetaminophen, and Tramadol   Assessment / Plan:     Visit Diagnoses: Rheumatoid factor positive - She had work-up by Dr. Trudie Reed in the past which was negative.  She has positive rheumatoid factor but no synovitis on examination.  We had detailed  discussion regarding the left findings.  Chronic pain of both shoulders -  rotator cuff tear of bilateral shoulders.  She states she had right rotator cuff tear surgery in May 2021.  She continues to have some discomfort in her shoulders  Pain in both hands-She had no synovitis on examination and no synovial thickening was noted.  Primary osteoarthritis of both knees - Diagnosed with end-stage osteoarthritis in the past.  Total knee replacement is scheduled by Dr. Erlinda Hong 2022.  She continues to have pain and discomfort in her knee joints and difficulty with mobility.  Natural anti-inflammatories were discussed.  Pain in both feet-proper fitting shoes were discussed.  DDD (degenerative disc disease), lumbar - Followed by [facility] orthopedics, Dr. Mina Marble.  And will be seeing Dr. Ernestina Patches now due to insurance changes.  She also requests referral to pain management.  I will make a referral for her.  She had to come off Celebrex due to low GFR.  Idiopathic chronic gout of multiple sites without tophus - She is on allopurinol 200 mg p.o. daily.  She has been getting allopurinol through her PCP.  Her uric acid is in desirable range.  She has not had a gout flare.  Fibromyalgia-she continues to have some  generalized pain and discomfort.  She will benefit from pain management.  Need for regular exercise and stretching was also emphasized.  Other fatigue - CK and TSH are normal.  History of chronic kidney disease-she was evaluated by nephrologist.  Primary hypertension-her systolic blood pressure is elevated.  Have advised her to monitor blood pressure closely.  History of hyperlipidemia  Other insomnia  Vitamin D deficiency  Osteopenia of multiple sites  History of depression  Orders: No orders of the defined types were placed in this encounter.  No orders of the defined types were placed in this encounter.   =  Follow-Up Instructions: Return in about 1 year (around 03/03/2021) for Osteoarthritis, Gout.   Bo Merino, MD  Note - This record has been created using Editor, commissioning.  Chart creation errors have been sought, but may not always  have been located. Such creation errors do not reflect on  the standard of medical care.

## 2020-02-20 ENCOUNTER — Other Ambulatory Visit: Payer: Self-pay | Admitting: Internal Medicine

## 2020-02-20 ENCOUNTER — Telehealth: Payer: Self-pay | Admitting: Orthopaedic Surgery

## 2020-02-20 DIAGNOSIS — M79604 Pain in right leg: Secondary | ICD-10-CM

## 2020-02-20 DIAGNOSIS — I10 Essential (primary) hypertension: Secondary | ICD-10-CM

## 2020-02-20 NOTE — Telephone Encounter (Signed)
Received vm from pt, checking if we received records from [name] last week. Do you have anything? [phone]

## 2020-02-21 ENCOUNTER — Other Ambulatory Visit: Payer: Self-pay | Admitting: Nephrology

## 2020-02-21 ENCOUNTER — Telehealth: Payer: Self-pay | Admitting: Orthopaedic Surgery

## 2020-02-21 ENCOUNTER — Encounter: Payer: Self-pay | Admitting: Internal Medicine

## 2020-02-21 DIAGNOSIS — N183 Chronic kidney disease, stage 3 unspecified: Secondary | ICD-10-CM

## 2020-02-21 NOTE — Telephone Encounter (Signed)
I was not here last week. But I do not see any paper work of her in my area.

## 2020-02-21 NOTE — Telephone Encounter (Signed)
Received again today, bringing to you

## 2020-02-21 NOTE — Telephone Encounter (Signed)
Pt called wanting to make [place] aware she has some forms being faxed today and she asked she be updated when we do receive everything.   [phone]

## 2020-02-21 NOTE — Telephone Encounter (Signed)
She called this AM

## 2020-02-22 ENCOUNTER — Other Ambulatory Visit: Payer: Self-pay

## 2020-02-22 ENCOUNTER — Ambulatory Visit
Admission: RE | Admit: 2020-02-22 | Discharge: 2020-02-22 | Disposition: A | Discharge status: Home / Self Care | Payer: No Typology Code available for payment source | Source: Ambulatory Visit | Attending: Family Medicine | Admitting: Family Medicine

## 2020-02-22 VITALS — BP 150/79 | HR 66 | Temp 98.1°F | Resp 18

## 2020-02-22 DIAGNOSIS — K529 Noninfective gastroenteritis and colitis, unspecified: Secondary | ICD-10-CM

## 2020-02-22 MED ORDER — DIPHENOXYLATE-ATROPINE 2.5-0.025 MG PO TABS: 1.0000 | ORAL_TABLET | Freq: Four times a day (QID) | ORAL | 0 refills | Status: DC | PRN

## 2020-02-22 MED ORDER — DICYCLOMINE HCL 20 MG PO TABS: 20.0000 mg | ORAL_TABLET | Freq: Two times a day (BID) | ORAL | 0 refills | Status: DC

## 2020-02-22 MED ORDER — FAMOTIDINE 20 MG PO TABS: 20.0000 mg | ORAL_TABLET | Freq: Two times a day (BID) | ORAL | 0 refills | Status: DC

## 2020-02-22 NOTE — Discharge Instructions (Signed)
Hold over-the-counter omeprazole and take famotidine 20 mg twice daily as needed for abdominal indigestion and upset symptoms.  When she discontinued use of famotidine you can resume your regular regimen with omeprazole.  I have prescribed Lomotil you can take this up to 4 times daily for loose stool.  Bentyl 20 mg she can take this twice daily as needed for increased abdominal spasms and abdominal discomfort.  If you develop fever or worsening or severe abdominal pain go immediately to the emergency department.  Your COVID-19 test will be available within the next 2 to 3 days and will result via MyChart.

## 2020-02-22 NOTE — ED Provider Notes (Signed)
[name]    CSN: [id number] Arrival date & time: 02/22/20  0836      History   Chief Complaint Chief Complaint  Patient presents with  . appt 9- diarrhea    HPI Amy Hooper is a 69 y.o. female.   HPI  Patient presents today with 4 to 5 days of loose diarrheal type stool.  Patient reports since the Thanksgiving holiday she ate foods that she normally does not include as a part of her regular diet.  She has had some intermittent indigestive-like symptoms abdominal gurgling and cramping ongoing intermittently since 4 to 5 days ago.  She reports some intermittent nausea without vomitus.  She has had a decrease in appetite over the last few days.  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  She has not been made aware of any exposures to Covid and they typically test every week however she has not been tested since the onset of her current symptoms.  She has not had fever no other URI symptoms no generalized body aches.  She has been taking Pepto-Bismol along with omeprazole without relief of symptoms.  No history of diverticular disease.  Past Medical History:  Diagnosis Date  . Depression   . Fibromyalgia   . Gout   . HTN (hypertension)   . Insomnia   . Morbid obesity ([place])   . Osteoarthritis   . Palpitation     Patient Active Problem List   Diagnosis Date Noted  . DDD (degenerative disc disease), lumbar 02/05/2020  . Vitamin D deficiency 01/09/2020  . Hyperlipidemia 01/09/2020  . CKD (chronic kidney disease) stage 3, GFR 30-59 ml/min (HCC) 01/09/2020  . HTN (hypertension)   . Morbid obesity ([place])   . Gout   . Fibromyalgia   . Depression   . Insomnia     Past Surgical History:  Procedure Laterality Date  . KNEE ARTHROSCOPY    . right rotator cuff    . ROTATOR CUFF REPAIR Left   . TUBAL LIGATION      OB History   No obstetric history  on file.      Home Medications    Prior to Admission medications   Medication Sig Start Date End Date Taking? Authorizing Provider  acetaminophen (TYLENOL) 500 MG tablet Take 500 mg by mouth every 6 (six) hours as needed.    [provider]  allopurinol (ZYLOPRIM) 100 MG tablet TAKE 2 TABLETS BY MOUTH ONCE DAILY 02/21/20   Isaac Bliss, [name], MD  amLODipine (NORVASC) 5 MG tablet TAKE 1 TABLET BY MOUTH EVERY DAY 02/21/20   Isaac Bliss, [name], MD  atorvastatin (LIPITOR) 10 MG tablet Take 10 mg by mouth at bedtime. 11/15/19   [provider]  diclofenac Sodium (VOLTAREN) 1 % GEL Apply topically 4 (four) times daily.    [provider]  DULoxetine (CYMBALTA) 30 MG capsule TAKE 1 CAPSULE BY MOUTH EVERY DAY 02/21/20   Isaac Bliss, [name], MD  eszopiclone (LUNESTA) 2 MG TABS tablet TAKE 1 TABLET BY MOUTH AT NIGHT IMMEDIATELY BEFORE BEDTIME 02/21/20   Isaac Bliss, [name], MD  fluticasone [facility]) 50 MCG/ACT nasal spray Place into both nostrils daily.    [provider]  loratadine (CLARITIN) 10 MG tablet Take 10 mg by mouth daily.    [provider]  losartan (COZAAR) 50 MG tablet TAKE 1 TABLET BY  MOUTH EVERY DAY 02/21/20   Isaac Bliss, [name], MD  Multiple Vitamins-Minerals (CENTRUM SILVER PO) Take 1 tablet by mouth daily.    [provider]  Vitamin D, Ergocalciferol, (DRISDOL) 1.25 MG (50000 UNIT) CAPS capsule Take 1 capsule (50,000 Units total) by mouth every 7 (seven) days for 12 doses. 01/09/20 03/27/20  [name], MD    Family History Family History  Problem Relation Age of Onset  . Hypothyroidism Mother   . Hyperlipidemia Other   . Kidney failure Father   . Dementia Father   . Gout Father   . Arthritis Father   . Prostate cancer Father     Social History Social History   Tobacco Use  . Smoking status: Never Smoker  . Smokeless tobacco: Never Used  Vaping Use  . Vaping Use:  Never used  Substance Use Topics  . Alcohol use: Never    Alcohol/week: 0.0 standard drinks  . Drug use: Never     Allergies   Cefuroxime axetil, Clarithromycin, Nsaids, Percocet [oxycodone-acetaminophen], Hydrocodone-acetaminophen, Other, Oxycodone-acetaminophen, and Tramadol   Review of Systems Review of Systems Pertinent negatives listed in HPI  Physical Exam Triage Vital Signs ED Triage Vitals  Enc Vitals Group     BP 02/22/20 0916 (!) 150/79     Pulse Rate 02/22/20 0916 66     Resp 02/22/20 0916 18     Temp 02/22/20 0916 98.1 F (36.7 C)     Temp Source 02/22/20 0916 Oral     SpO2 02/22/20 0916 96 %     Weight --      Height --      Head Circumference --      Peak Flow --      Pain Score 02/22/20 0917 0     Pain Loc --      Pain Edu? --      Excl. in [place]? --    No data found.  Updated Vital Signs BP (!) 150/79 (BP Location: Left Arm)   Pulse 66   Temp 98.1 F (36.7 C) (Oral)   Resp 18   SpO2 96%   Visual Acuity Right Eye Distance:   Left Eye Distance:   Bilateral Distance:    Right Eye Near:   Left Eye Near:    Bilateral Near:     Physical Exam General appearance: alert, well developed, well nourished, cooperative and in no distress Head: Normocephalic, without obvious abnormality, atraumatic Respiratory: Respirations even and unlabored, normal respiratory rate Heart: rate and rhythm normal. No gallop or murmurs noted on exam  Abdomen: BS hyperactive, no distention,no rebound tenderness, no reproducible tenderness Extremities: No gross deformities Skin: Skin color, texture, turgor normal. No rashes seen  Psych: Appropriate mood and affect.  UC Treatments / Results  Labs (all labs ordered are listed, but only abnormal results are displayed) Labs Reviewed  NOVEL CORONAVIRUS, NAA    EKG   Radiology No results found.  Procedures Procedures (including critical care time)  Medications Ordered in UC Medications - No data to  display  Initial Impression / Assessment and Plan / UC Course  I have reviewed the triage vital signs and the nursing notes.  Pertinent labs & imaging results that were available during my care of the patient were reviewed by me and considered in my medical decision making (see chart for details).     Patient presents today with symptoms consistent with a gastroenteritis related to ingestion of likely food substances.  Patient is  able to tolerate intake of food and fluids therefore she is able to maintain hydration.  Abdominal exam grossly intact no concern for an acute abdomen.  Patient vital signs are also stable.  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  Covid test is pending.  We will treat for gastroenteritis for management of symptoms.  Red flags discussed that warrant immediate follow-up at the emergency department.  Patient verbalized understanding and agreement with plan.  Treatment per discharge medication orders.   Final Clinical Impressions(s) / UC Diagnoses   Final diagnoses:  Gastroenteritis     Discharge Instructions     Hold over-the-counter omeprazole and take famotidine 20 mg twice daily as needed for abdominal indigestion and upset symptoms.  When she discontinued use of famotidine you can resume your regular regimen with omeprazole.  I have prescribed Lomotil you can take this up to 4 times daily for loose stool.  Bentyl 20 mg she can take this twice daily as needed for increased abdominal spasms and abdominal discomfort.  If you develop fever or worsening or severe abdominal pain go immediately to the emergency department.  Your COVID-19 test will be available within the next 2 to 3 days and will result via MyChart.    ED Prescriptions    Medication Sig Dispense Auth. Provider   famotidine (PEPCID) 20 MG tablet Take 1 tablet (20 mg total) by mouth 2 (two) times daily. 30 tablet Scot Jun,  FNP   dicyclomine (BENTYL) 20 MG tablet Take 1 tablet (20 mg total) by mouth 2 (two) times daily. 20 tablet Scot Jun, FNP   diphenoxylate-atropine (LOMOTIL) 2.5-0.025 MG tablet  (Status: Discontinued) Take 1 tablet by mouth 4 (four) times daily as needed for diarrhea or loose stools. 30 tablet Scot Jun, FNP   diphenoxylate-atropine (LOMOTIL) 2.5-0.025 MG tablet Take 1 tablet by mouth 4 (four) times daily as needed for diarrhea or loose stools. 30 tablet Scot Jun, FNP     PDMP not reviewed this encounter.   Scot Jun, [place] 02/22/20 [phone]

## 2020-02-22 NOTE — ED Triage Notes (Signed)
Pt states ate a lot last weekend and had abdominal gurgling on Sunday. States has had loose stool and no appetite since Tuesday.  States having lower abdominal cramping as well.

## 2020-02-23 ENCOUNTER — Encounter: Payer: Self-pay | Admitting: Orthopaedic Surgery

## 2020-02-23 LAB — NOVEL CORONAVIRUS, NAA: SARS-CoV-2, NAA: NOT DETECTED

## 2020-02-23 LAB — SARS-COV-2, NAA 2 DAY TAT

## 2020-02-25 ENCOUNTER — Other Ambulatory Visit: Payer: Self-pay | Admitting: Internal Medicine

## 2020-02-25 DIAGNOSIS — E559 Vitamin D deficiency, unspecified: Secondary | ICD-10-CM

## 2020-02-29 ENCOUNTER — Other Ambulatory Visit: Payer: Self-pay | Admitting: Family Medicine

## 2020-03-03 ENCOUNTER — Ambulatory Visit (INDEPENDENT_AMBULATORY_CARE_PROVIDER_SITE_OTHER): Payer: No Typology Code available for payment source | Admitting: Rheumatology

## 2020-03-03 ENCOUNTER — Other Ambulatory Visit: Payer: Self-pay

## 2020-03-03 ENCOUNTER — Encounter: Payer: Self-pay | Admitting: Rheumatology

## 2020-03-03 VITALS — BP 151/75 | HR 60 | Resp 16 | Ht 62.0 in | Wt 211.4 lb

## 2020-03-03 DIAGNOSIS — R768 Other specified abnormal immunological findings in serum: Secondary | ICD-10-CM

## 2020-03-03 DIAGNOSIS — M25512 Pain in left shoulder: Secondary | ICD-10-CM

## 2020-03-03 DIAGNOSIS — M25511 Pain in right shoulder: Secondary | ICD-10-CM | POA: Diagnosis not present

## 2020-03-03 DIAGNOSIS — M8589 Other specified disorders of bone density and structure, multiple sites: Secondary | ICD-10-CM

## 2020-03-03 DIAGNOSIS — M79642 Pain in left hand: Secondary | ICD-10-CM

## 2020-03-03 DIAGNOSIS — G894 Chronic pain syndrome: Secondary | ICD-10-CM | POA: Insufficient documentation

## 2020-03-03 DIAGNOSIS — M170 Bilateral primary osteoarthritis of knee: Secondary | ICD-10-CM | POA: Insufficient documentation

## 2020-03-03 DIAGNOSIS — G4709 Other insomnia: Secondary | ICD-10-CM

## 2020-03-03 DIAGNOSIS — M79671 Pain in right foot: Secondary | ICD-10-CM

## 2020-03-03 DIAGNOSIS — R5383 Other fatigue: Secondary | ICD-10-CM

## 2020-03-03 DIAGNOSIS — Z87448 Personal history of other diseases of urinary system: Secondary | ICD-10-CM

## 2020-03-03 DIAGNOSIS — M51369 Other intervertebral disc degeneration, lumbar region without mention of lumbar back pain or lower extremity pain: Secondary | ICD-10-CM

## 2020-03-03 DIAGNOSIS — M5136 Other intervertebral disc degeneration, lumbar region: Secondary | ICD-10-CM

## 2020-03-03 DIAGNOSIS — M79641 Pain in right hand: Secondary | ICD-10-CM | POA: Diagnosis not present

## 2020-03-03 DIAGNOSIS — G8929 Other chronic pain: Secondary | ICD-10-CM

## 2020-03-03 DIAGNOSIS — Z8659 Personal history of other mental and behavioral disorders: Secondary | ICD-10-CM

## 2020-03-03 DIAGNOSIS — M79672 Pain in left foot: Secondary | ICD-10-CM

## 2020-03-03 DIAGNOSIS — Z8639 Personal history of other endocrine, nutritional and metabolic disease: Secondary | ICD-10-CM

## 2020-03-03 DIAGNOSIS — E559 Vitamin D deficiency, unspecified: Secondary | ICD-10-CM

## 2020-03-03 DIAGNOSIS — M797 Fibromyalgia: Secondary | ICD-10-CM

## 2020-03-03 DIAGNOSIS — I10 Essential (primary) hypertension: Secondary | ICD-10-CM

## 2020-03-03 DIAGNOSIS — M1A09X0 Idiopathic chronic gout, multiple sites, without tophus (tophi): Secondary | ICD-10-CM

## 2020-03-03 NOTE — Addendum Note (Signed)
Addended by: Earnestine Mealing on: 03/03/2020 10:33 AM   Modules accepted: Orders

## 2020-03-07 ENCOUNTER — Ambulatory Visit
Admission: RE | Admit: 2020-03-07 | Discharge: 2020-03-07 | Disposition: A | Discharge status: Home / Self Care | Payer: No Typology Code available for payment source | Source: Ambulatory Visit | Attending: Nephrology | Admitting: Nephrology

## 2020-03-07 DIAGNOSIS — N183 Chronic kidney disease, stage 3 unspecified: Secondary | ICD-10-CM

## 2020-03-07 IMAGING — US US RENAL
1 series · 14 of 25 positions shown · non-contrast
Comparison: None.

CLINICAL DATA: Chronic renal disease

EXAM:
RENAL / URINARY TRACT ULTRASOUND COMPLETE

[Series 1: us renal · 0.23mm/px · 14 of 45 slices shown]
[im 1/45]
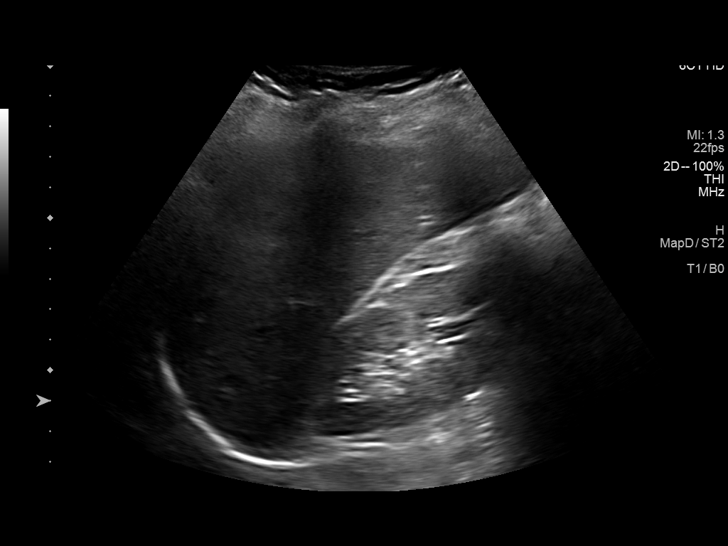
[im 4/45]
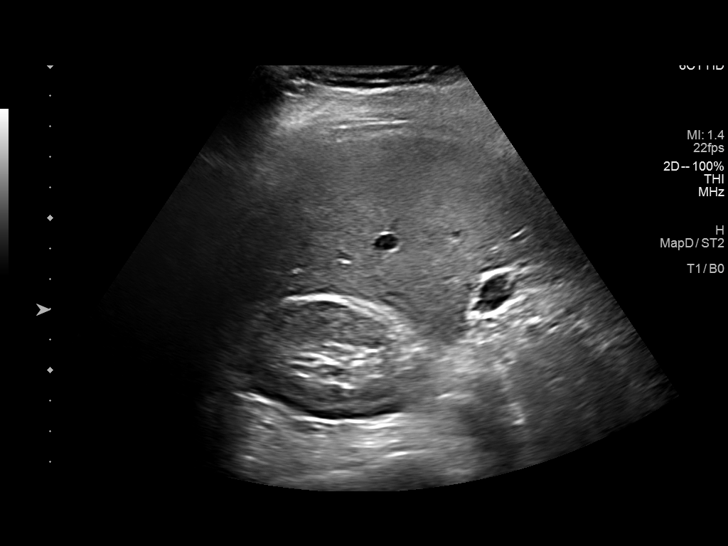
[im 8/45]
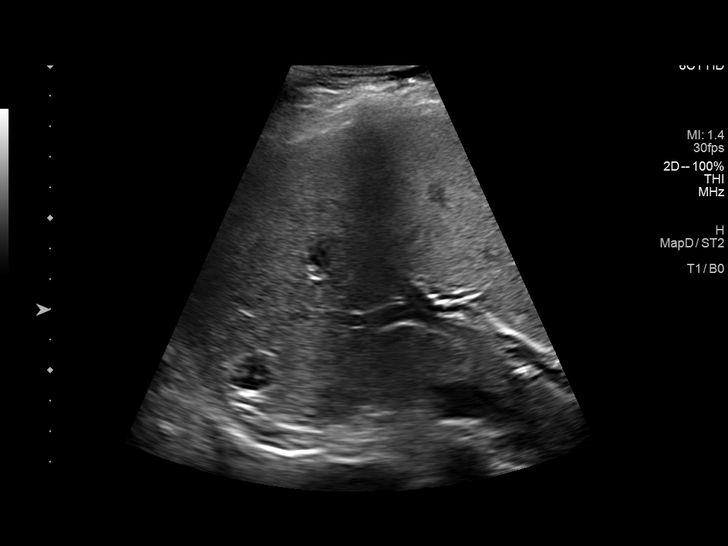
[im 12/45]
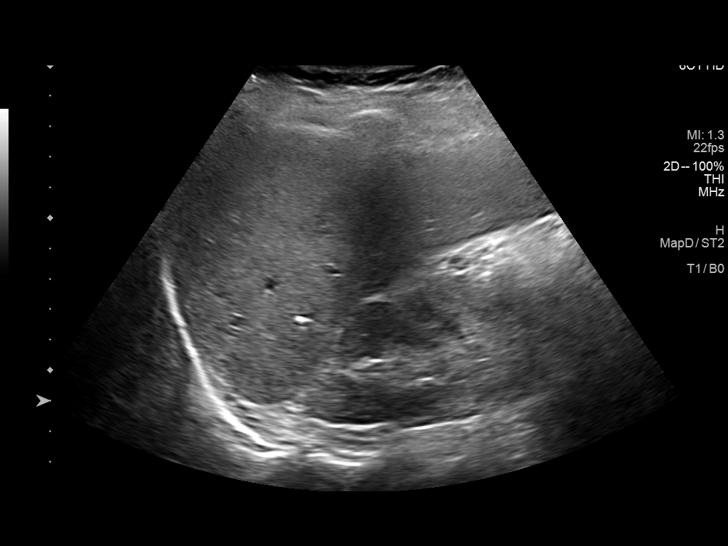
[im 15/45]
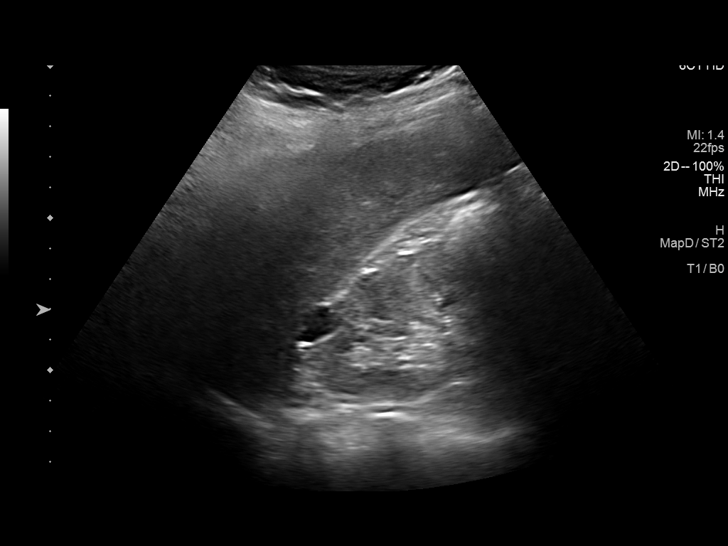
[im 17/45]
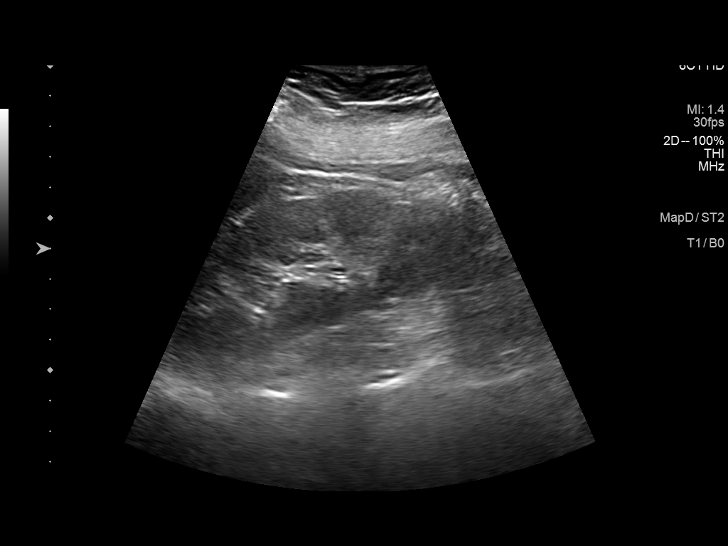
[im 21/45]
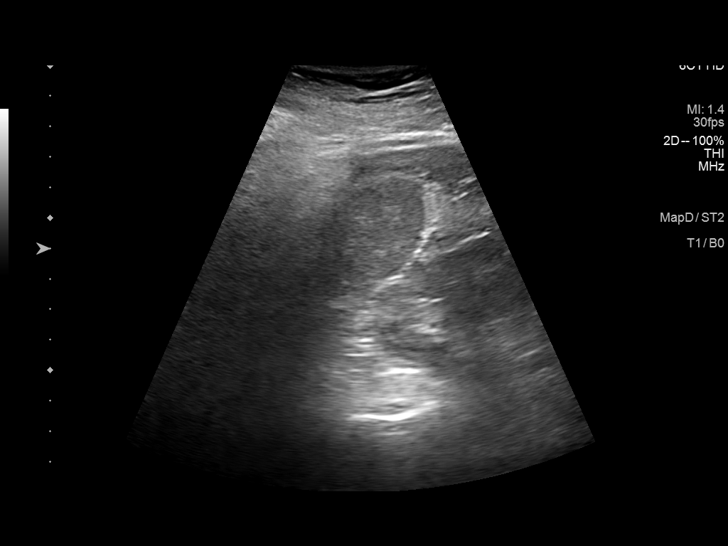
[im 24/45]
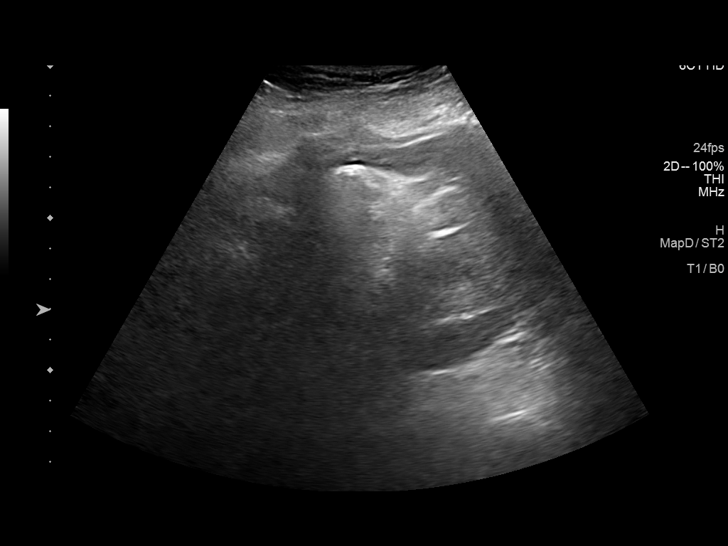
[im 28/45]
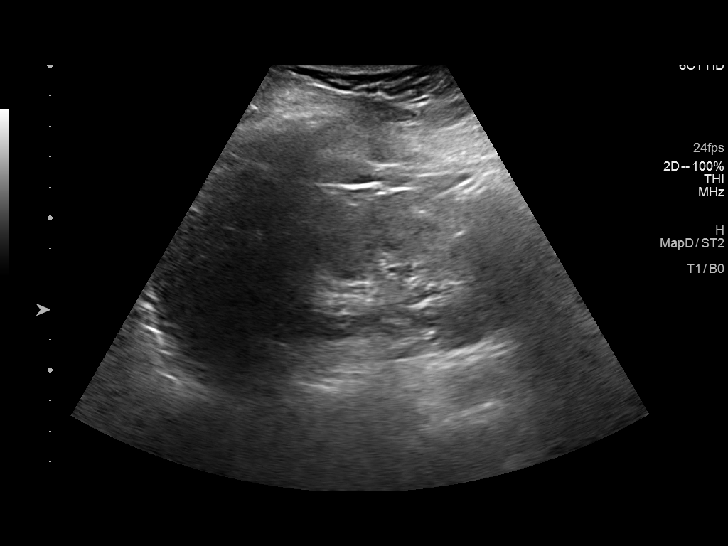
[im 30/45]
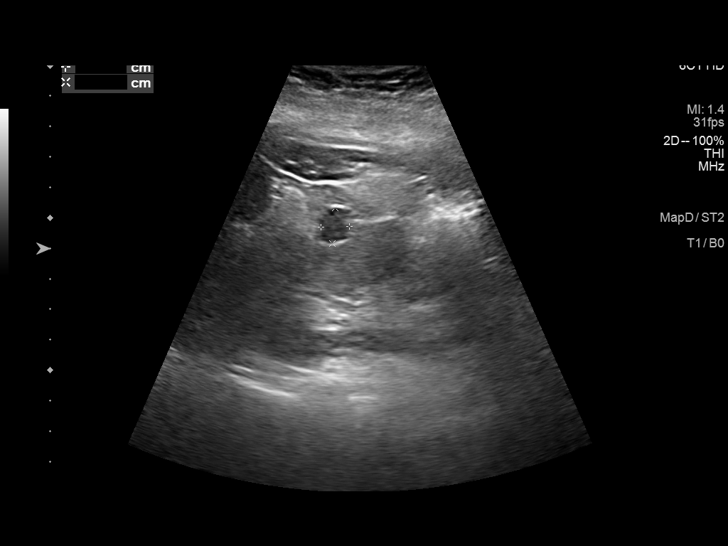
[im 34/45]
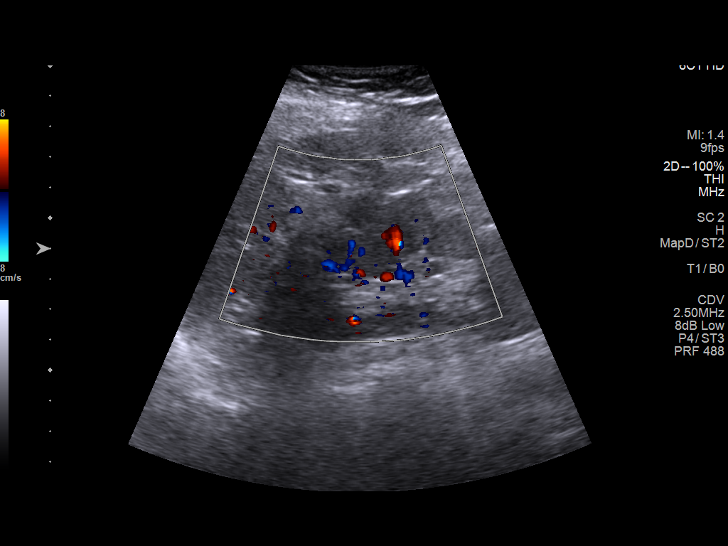
[im 37/45]
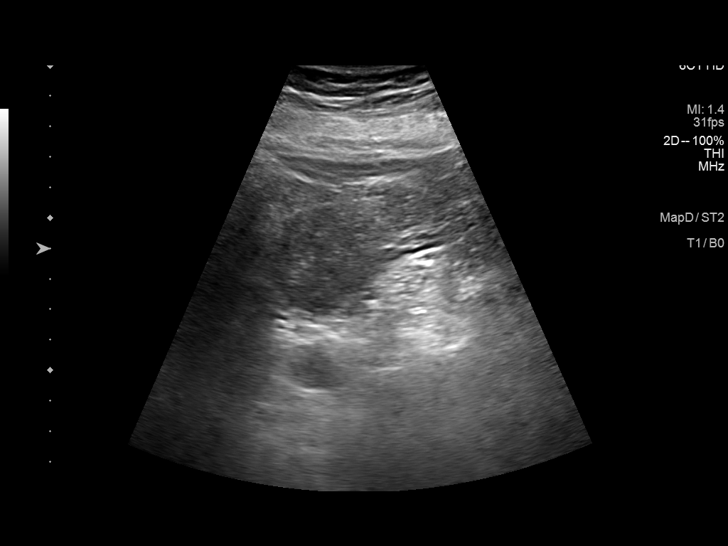
[im 41/45]
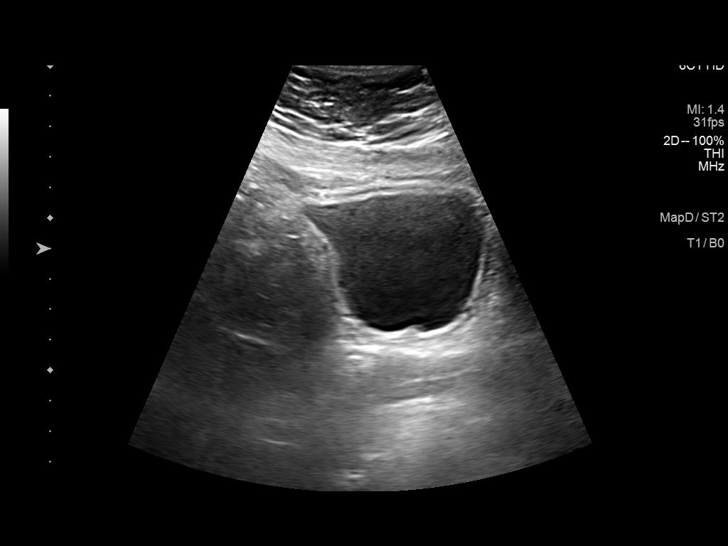
[im 45/45]
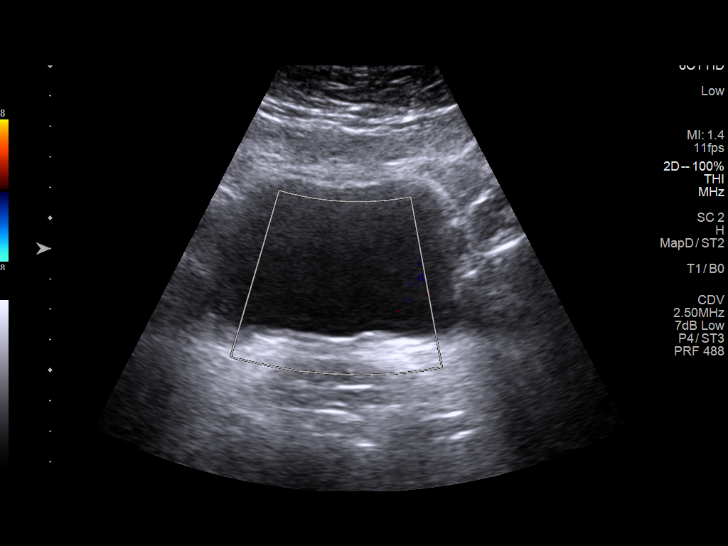

[14 of 25 positions shown; findings below may reference images not displayed]

FINDINGS: Right Kidney:

Renal measurements: 10.1 x 4.6 x 4.1 cm = volume: 100.2 mL. Contains
a small mass with mild internal echoes measuring up to 1.3 cm in the
upper pole.

Left Kidney:

Renal measurements: 9.8 x 4.4 x 4.1 cm = volume: 93.4 mL. Contains a
12 mm mass with low internal echoes.

Bladder:

Appears normal for degree of bladder distention.

Other:

None.
IMPRESSION: 1. There is a single mass in each kidney measuring 1.3 cm on the
right and 1.2 cm on the left. I suspect these represent complicated
cysts but solid masses are not excluded. Recommend MRI for further
evaluation. If the patient is not eligible for MRI, recommend a
short-term follow-up ultrasound in 3-6 months to ensure stability.
2. No acute abnormalities.

These results will be called to the ordering clinician or
representative by the Radiologist Assistant, and communication
documented in the PACS or [REDACTED].

## 2020-03-17 ENCOUNTER — Encounter: Payer: Self-pay | Admitting: Physical Medicine & Rehabilitation

## 2020-03-18 ENCOUNTER — Other Ambulatory Visit: Payer: Self-pay

## 2020-03-18 ENCOUNTER — Ambulatory Visit (INDEPENDENT_AMBULATORY_CARE_PROVIDER_SITE_OTHER): Payer: No Typology Code available for payment source | Admitting: Orthopaedic Surgery

## 2020-03-18 ENCOUNTER — Encounter: Payer: Self-pay | Admitting: Orthopaedic Surgery

## 2020-03-18 ENCOUNTER — Ambulatory Visit: Payer: No Typology Code available for payment source | Admitting: Orthopaedic Surgery

## 2020-03-18 DIAGNOSIS — M1712 Unilateral primary osteoarthritis, left knee: Secondary | ICD-10-CM | POA: Diagnosis not present

## 2020-03-18 MED ORDER — BUPIVACAINE HCL 0.25 % IJ SOLN
2.0000 mL | INTRAMUSCULAR | Status: AC | PRN
  Administered 2020-03-18: 2 mL via INTRA_ARTICULAR

## 2020-03-18 MED ORDER — METHYLPREDNISOLONE ACETATE 40 MG/ML IJ SUSP
40.0000 mg | INTRAMUSCULAR | Status: AC | PRN
  Administered 2020-03-18: 40 mg via INTRA_ARTICULAR

## 2020-03-18 MED ORDER — LIDOCAINE HCL 1 % IJ SOLN
2.0000 mL | INTRAMUSCULAR | Status: AC | PRN
  Administered 2020-03-18: 2 mL

## 2020-03-18 NOTE — Progress Notes (Signed)
Office Visit Note   [name]           Date of Birth: [birth year]           MRN: [id number] Visit Date: 03/18/2020              Requested by: Philip Aspen, Limmie Patricia, MD [address] [place],  [place] [address] PCP: Philip Aspen, Limmie Patricia, MD   Assessment & Plan: Visit Diagnoses:  1. Unilateral primary osteoarthritis, left knee     Plan: Impression is advanced degenerative joint disease to the left knee.  We proceeded with left knee cortisone injection today.  The patient is interested in proceeding with left total knee replacement later this summer.  She will follow up with [place] in June for planned surgery in August.  Call with concerns or questions in the meantime.  Follow-Up Instructions: Return in about 6 months (around 09/16/2020).   Orders:  Orders Placed This Encounter  Procedures  . Large Joint Inj   No orders of the defined types were placed in this encounter.     Procedures: Large Joint Inj: L knee on 03/18/2020 9:20 AM Indications: pain Details: 22 G needle, anterolateral approach Medications: 2 mL lidocaine 1 %; 2 mL bupivacaine 0.25 %; 40 mg methylPREDNISolone acetate 40 MG/ML      Clinical Data: No additional findings.   Subjective: Chief Complaint  Patient presents with  . Left Knee - Pain    HPI patient is a pleasant 69 year old female who comes in today with recurrent left knee pain.  History of advanced degenerative joint disease.  She has been seen by [place] in the past for this with intermittent cortisone injections.  Insurance has previously denied viscosupplementation injections.  She does note that she gets about 2 months relief from each cortisone injection.  The pain is recently returned and is started to progress.  She has constant aches to the left knee.  Worse with activity.  She is requesting repeat cortisone injection today.  Review of Systems as detailed in HPI.  All others reviewed and are  negative.   Objective: Vital Signs: There were no vitals taken for this visit.  Physical Exam well-developed well-nourished female in no acute distress.  Alert and oriented x3.  Ortho Exam left knee exam shows trace effusion.  Range of motion 0 to 110 degrees.  Medial joint line tenderness.  Ligaments are stable.  She is neurovascular intact distally.  Specialty Comments:  No specialty comments available.  Imaging: No new imaging   PMFS History: Patient Active Problem List   Diagnosis Date Noted  . Chronic pain of both shoulders 03/03/2020  . Pain in both hands 03/03/2020  . Primary osteoarthritis of both knees 03/03/2020  . Pain in both feet 03/03/2020  . History of chronic kidney disease 03/03/2020  . DDD (degenerative disc disease), lumbar 02/05/2020  . Vitamin D deficiency 01/09/2020  . Hyperlipidemia 01/09/2020  . CKD (chronic kidney disease) stage 3, GFR 30-59 ml/min (HCC) 01/09/2020  . HTN (hypertension)   . Morbid obesity (HCC)   . Gout   . Fibromyalgia   . Depression   . Insomnia    Past Medical History:  Diagnosis Date  . Chronic kidney disease   . Depression   . Fibromyalgia   . Gout   . HTN (hypertension)   . Insomnia   . Morbid obesity (HCC)   . Osteoarthritis   . Palpitation     Family History  Problem Relation Age of Onset  . Hypothyroidism Mother   . Hyperlipidemia Other   . Kidney failure Father   . Dementia Father   . Gout Father   . Arthritis Father   . Prostate cancer Father     Past Surgical History:  Procedure Laterality Date  . KNEE ARTHROSCOPY    . right rotator cuff    . ROTATOR CUFF REPAIR Left   . TUBAL LIGATION     Social History   Occupational History  . 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 Smoking status: Never Smoker  . Smokeless tobacco: Never Used  Vaping Use  . Vaping Use: Never used  Substance and Sexual Activity  . Alcohol use: Never    Alcohol/week: 0.0 standard drinks  . Drug use: Never  . Sexual  activity: Never

## 2020-03-26 ENCOUNTER — Encounter: Payer: Self-pay | Admitting: Physical Medicine and Rehabilitation

## 2020-03-26 ENCOUNTER — Other Ambulatory Visit: Payer: Self-pay

## 2020-03-26 ENCOUNTER — Ambulatory Visit (INDEPENDENT_AMBULATORY_CARE_PROVIDER_SITE_OTHER): Payer: No Typology Code available for payment source | Admitting: Physical Medicine and Rehabilitation

## 2020-03-26 VITALS — BP 142/74 | HR 71 | Ht 63.0 in | Wt 203.8 lb

## 2020-03-26 DIAGNOSIS — G8929 Other chronic pain: Secondary | ICD-10-CM

## 2020-03-26 DIAGNOSIS — M5416 Radiculopathy, lumbar region: Secondary | ICD-10-CM

## 2020-03-26 DIAGNOSIS — M47816 Spondylosis without myelopathy or radiculopathy, lumbar region: Secondary | ICD-10-CM

## 2020-03-26 DIAGNOSIS — M5442 Lumbago with sciatica, left side: Secondary | ICD-10-CM | POA: Diagnosis not present

## 2020-03-26 DIAGNOSIS — M5116 Intervertebral disc disorders with radiculopathy, lumbar region: Secondary | ICD-10-CM

## 2020-03-26 DIAGNOSIS — G894 Chronic pain syndrome: Secondary | ICD-10-CM

## 2020-03-26 DIAGNOSIS — M5441 Lumbago with sciatica, right side: Secondary | ICD-10-CM

## 2020-03-26 DIAGNOSIS — M797 Fibromyalgia: Secondary | ICD-10-CM

## 2020-03-26 NOTE — Progress Notes (Signed)
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 Had injections with DR Modesto Charon 1 year ago. Numeric Pain Rating Scale and Functional Assessment Average Pain 8   In the last MONTH (on 0-10 scale) has pain interfered with the following?  1. General activity like being  able to carry out your everyday physical activities such as walking, climbing stairs, carrying groceries, or moving a chair?  Rating(10)  Pin in lower back and radiates down both legs. Unbearable at night. Pain was 4/10 when she was on Celebrex but shes no longer on it. +Driver, -BT, -Dye Allergies.

## 2020-03-31 ENCOUNTER — Encounter: Payer: Self-pay | Admitting: Neurology

## 2020-03-31 ENCOUNTER — Ambulatory Visit (INDEPENDENT_AMBULATORY_CARE_PROVIDER_SITE_OTHER): Payer: No Typology Code available for payment source | Admitting: Neurology

## 2020-03-31 VITALS — BP 151/81 | HR 63 | Ht 63.0 in | Wt 202.0 lb

## 2020-03-31 DIAGNOSIS — G4719 Other hypersomnia: Secondary | ICD-10-CM | POA: Diagnosis not present

## 2020-03-31 DIAGNOSIS — E669 Obesity, unspecified: Secondary | ICD-10-CM

## 2020-03-31 DIAGNOSIS — Z820 Family history of epilepsy and other diseases of the nervous system: Secondary | ICD-10-CM

## 2020-03-31 DIAGNOSIS — R0683 Snoring: Secondary | ICD-10-CM

## 2020-03-31 DIAGNOSIS — R351 Nocturia: Secondary | ICD-10-CM

## 2020-03-31 NOTE — Patient Instructions (Signed)

## 2020-03-31 NOTE — Progress Notes (Signed)
Subjective:    Patient ID: Amy Hooper is a 70 y.o. female.  HPI     Star Age, MD, PhD [facility] [address], [address] [place], [place] [address]  Dear Dr. Isaac Bliss,  I saw your patient, Amy Hooper, upon your kind request in the sleep clinic today for initial consultation of her sleep disorder, in particular, concern for underlying obstructive sleep apnea.  The patient is unaccompanied today.  As you know, [name] is a 70 year old right-handed woman with an underlying medical history of osteoarthritis with status post bilateral knee arthroscopic surgeries, bilateral shoulder arthroscopic surgeries, hypertension, gout, depression, fibromyalgia, degenerative disc disease of the lumbar spine, chronic kidney disease, palpitations and obesity, who reports snoring and excessive daytime somnolence.  I reviewed your office note from January 08, 2020.  Her Epworth sleepiness score is 14 out of 24, fatigue severity score is 60 out of 63.  She is married and lives with her husband, her sister and her mom.  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  She is a non-smoker and does not currently drink any alcohol, drinks caffeine in the form of coffee, 1 cup/day on average. She has had difficulty going to sleep, this has been going on for quite some time, for the past year she has been on Lunesta 2 mg at bedtime and reports that she is not able to sleep without it.  She had tried Benadryl before then.  She reports a family history of sleep apnea, her father had a CPAP machine.  He died at 40.  She has 3 grown children.  She does not watch TV in her bedroom.  She tries to be in bed around 10 PM and rise time is generally around 6 AM.  She is planning to retire.  She will need knee replacement surgery most likely to the left knee, she is planning to have this done in or around August of this year. She has nocturia about 3 times per average 9.   She has radiating low back pain which bothers her often at night as well.  Her Past Medical History Is Significant For: Past Medical History:  Diagnosis Date  . Chronic kidney disease   . Depression   . Fibromyalgia   . Gout   . HTN (hypertension)   . Insomnia   . Morbid obesity ([place])   . Osteoarthritis   . Palpitation     Her Past Surgical History Is Significant For: Past Surgical History:  Procedure Laterality Date  . KNEE ARTHROSCOPY    . right rotator cuff    . ROTATOR CUFF REPAIR Left   . TUBAL LIGATION      Her Family History Is Significant For: Family History  Problem Relation Age of Onset  . Hypothyroidism Mother   . Hyperlipidemia Other   . Kidney failure Father   . Dementia Father   . Gout Father   . Arthritis Father   . Prostate cancer Father     Her Social History Is Significant For: Social History   Socioeconomic History  . Marital status: Married    Spouse name: Not on file  . Number of children: 3  . Years of education: Not on file  . Highest education level: Not on file  Occupational History  . 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 Smoking status: Never Smoker  . Smokeless tobacco: Never Used  Vaping Use  . Vaping Use: Never used  Substance and Sexual Activity  . Alcohol use: Never    Alcohol/week: 0.0 standard drinks  . Drug use: Never  . Sexual activity: Never  Other Topics Concern  . Not on file  Social History Narrative   Lives gives with fiance.     Social Determinants of Health   Financial Resource Strain: Not on file  Food Insecurity: Not on file  Transportation Needs: Not on file  Physical Activity: Not on file  Stress: Not on file  Social Connections: Not on file    Her Allergies Are:  Allergies  Allergen Reactions  . Cefuroxime Axetil Rash and Hives  . Clarithromycin Rash and Hives  . Nsaids Nausea And Vomiting  . Percocet [Oxycodone-Acetaminophen] Nausea And Vomiting    Pt. Also states it makes her hallucinate  .  Hydrocodone-Acetaminophen Other (See Comments)    Stomach upset  . Other Other (See Comments)    Upset stomach  . Oxycodone-Acetaminophen Other (See Comments)    Hallucination  . Tramadol Nausea And Vomiting and Other (See Comments)    Upset stomach  :   Her Current Medications Are:  Outpatient Encounter Medications as of 03/31/2020  Medication Sig  . acetaminophen (TYLENOL) 500 MG tablet Take 500 mg by mouth every 6 (six) hours as needed.  Marland Kitchen allopurinol (ZYLOPRIM) 100 MG tablet TAKE 2 TABLETS BY MOUTH ONCE DAILY  . amLODipine (NORVASC) 5 MG tablet TAKE 1 TABLET BY MOUTH EVERY DAY  . atorvastatin (LIPITOR) 10 MG tablet Take 10 mg by mouth at bedtime.  . diclofenac Sodium (VOLTAREN) 1 % GEL Apply topically 4 (four) times daily.  . DULoxetine (CYMBALTA) 30 MG capsule TAKE 1 CAPSULE BY MOUTH EVERY DAY  . eszopiclone (LUNESTA) 2 MG TABS tablet TAKE 1 TABLET BY MOUTH AT NIGHT IMMEDIATELY BEFORE BEDTIME  . fluticasone (FLONASE) 50 MCG/ACT nasal spray Place into both nostrils daily.  Marland Kitchen loratadine (CLARITIN) 10 MG tablet Take 10 mg by mouth daily.  Marland Kitchen losartan (COZAAR) 50 MG tablet TAKE 1 TABLET BY MOUTH EVERY DAY  . Multiple Vitamins-Minerals (CENTRUM SILVER PO) Take 1 tablet by mouth daily.   No facility-administered encounter medications on file as of 03/31/2020.  :  Review of Systems:  Out of a complete 14 point review of systems, all are reviewed and negative with the exception of these symptoms as listed below: Review of Systems  Neurological:       Pt presents today to discuss her sleep. Pt has never had a sleep study but does endorse snoring.  Epworth Sleepiness Scale 0= would never doze 1= slight chance of dozing 2= moderate chance of dozing 3= high chance of dozing  Sitting and reading: 3 Watching TV: 3 Sitting inactive in a public place (ex. Theater or meeting): 1 As a passenger in a car for an hour without a break: 2 Lying down to rest in the afternoon: 3 Sitting and  talking to someone: 1 Sitting quietly after lunch (no alcohol): 1 In a car, while stopped in traffic: 0 Total: 14     Objective:  Neurological Exam  Physical Exam Physical Examination:   Vitals:   03/31/20 0827  BP: (!) 151/81  Pulse: 63    General Examination: The patient is a very pleasant 70 y.o. female in no acute distress. She appears well-developed and well-nourished and well groomed.   HEENT: Normocephalic, atraumatic, pupils are equal, round and reactive to light, extraocular tracking is good without limitation to gaze excursion or nystagmus noted. Hearing is grossly  intact. Face is symmetric with normal facial animation. Speech is clear with no dysarthria noted. There is no hypophonia. There is no lip, neck/head, jaw or voice tremor. Neck is supple with full range of passive and active motion. There are no carotid bruits on auscultation. Oropharynx exam reveals: mild mouth dryness, good dental hygiene with dentures on top, mild airway crowding noted secondary to a wider uvula and mildly wider tongue, tonsils on the small side, about 1+ bilaterally.  Tongue protrudes centrally and palate elevates symmetrically.  Mallampati class I.  Nasal inspection reveals mild inferior turbinate hypertrophy, narrow nasal passages because of this, no deviated septum to speak of.  Neck circumference of 13 5/8 inches.    Chest: Clear to auscultation without wheezing, rhonchi or crackles noted.  Heart: S1+S2+0, regular and normal without murmurs, rubs or gallops noted.   Abdomen: Soft, non-tender and non-distended with normal bowel sounds appreciated on auscultation.  Extremities: There is no pitting edema in the distal lower extremities bilaterally.   Skin: Warm and dry without trophic changes noted.   Musculoskeletal: exam reveals left more than right knee pain, low back pain, currently without obvious radiation.    Neurologically:  Mental status: The patient is awake, alert and oriented  in all 4 spheres. Her immediate and remote memory, attention, language skills and fund of knowledge are appropriate. There is no evidence of aphasia, agnosia, apraxia or anomia. Speech is clear with normal prosody and enunciation. Thought process is linear. Mood is normal and affect is normal.  Cranial nerves II - XII are as described above under HEENT exam.  Motor exam: Normal bulk, strength and tone is noted. There is no tremor, fine motor skills and coordination: grossly intact.  Cerebellar testing: No dysmetria or intention tremor. There is no truncal or gait ataxia.  Sensory exam: intact to light touch in the upper and lower extremities.  Gait, station and balance: She stands with mild difficulty and pushes herself up.  Posture is age-appropriate.  She walks slowly but no obvious limp noted.   Assessment and Plan:  In summary, Indria Bishara is a very pleasant 70 y.o.-year old female with an underlying medical history of osteoarthritis with status post bilateral knee and shoulder surgeries, hypertension, gout, depression, fibromyalgia, degenerative disc disease of the lumbar spine, chronic kidney disease, palpitations and obesity, whose history and physical exam are concerning for obstructive sleep apnea (OSA). I had a long chat with the patient about my findings and the diagnosis of OSA, its prognosis and treatment options. We talked about medical treatments, surgical interventions and non-pharmacological approaches. I explained in particular the risks and ramifications of untreated moderate to severe OSA, especially with respect to developing cardiovascular disease down the Road, including congestive heart failure, difficult to treat hypertension, cardiac arrhythmias, or stroke. Even type 2 diabetes has, in part, been linked to untreated OSA. Symptoms of untreated OSA include daytime sleepiness, memory problems, mood irritability and mood disorder such as depression and anxiety, lack of  energy, as well as recurrent headaches, especially morning headaches. We talked about trying to maintain a healthy lifestyle in general, as well as the importance of weight control. We also talked about the importance of good sleep hygiene. I recommended the following at this time: sleep study.  I explained the sleep test procedure to the patient and also outlined possible surgical and non-surgical treatment options of OSA, including the use of a custom-made dental device (which would require a referral to a specialist dentist or  oral surgeon), upper airway surgical options, such as traditional UPPP or a novel less invasive surgical option in the form of Inspire hypoglossal nerve stimulation (which would involve a referral to an ENT surgeon). I also explained the CPAP treatment option to the patient, who indicated that she would be willing to try CPAP if the need arises. I explained the importance of being compliant with PAP treatment, not only for insurance purposes but primarily to improve Her symptoms, and for the patient's long term health benefit, including to reduce Her cardiovascular risks. I answered all her questions today and the patient was in agreement. I plan to see her back after the sleep study is completed and encouraged her to call with any interim questions, concerns, problems or updates.   Thank you very much for allowing me to participate in the care of this nice patient. If I can be of any further assistance to you please do not hesitate to call me at [phone].  Sincerely,   Star Age, MD, PhD

## 2020-04-01 ENCOUNTER — Other Ambulatory Visit: Payer: Self-pay | Admitting: Physical Medicine and Rehabilitation

## 2020-04-01 DIAGNOSIS — F411 Generalized anxiety disorder: Secondary | ICD-10-CM

## 2020-04-01 MED ORDER — DIAZEPAM 5 MG PO TABS: ORAL_TABLET | ORAL | 0 refills | Status: DC

## 2020-04-01 NOTE — Progress Notes (Signed)
Pre-procedure diazepam ordered for pre-operative anxiety.  

## 2020-04-03 ENCOUNTER — Other Ambulatory Visit: Payer: Self-pay

## 2020-04-03 ENCOUNTER — Ambulatory Visit: Payer: Self-pay

## 2020-04-03 ENCOUNTER — Encounter: Payer: Self-pay | Admitting: Physical Medicine and Rehabilitation

## 2020-04-03 ENCOUNTER — Ambulatory Visit (INDEPENDENT_AMBULATORY_CARE_PROVIDER_SITE_OTHER): Payer: No Typology Code available for payment source | Admitting: Physical Medicine and Rehabilitation

## 2020-04-03 VITALS — BP 141/81 | HR 70

## 2020-04-03 DIAGNOSIS — M5416 Radiculopathy, lumbar region: Secondary | ICD-10-CM

## 2020-04-03 MED ORDER — DEXAMETHASONE SODIUM PHOSPHATE 10 MG/ML IJ SOLN
15.0000 mg | Freq: Once | INTRAMUSCULAR | Status: AC
  Administered 2020-04-03: 15 mg

## 2020-04-03 NOTE — Progress Notes (Unsigned)
Pt state lower back pain that travels to both legs. Pt state walking and standing makes the pain worse. Pt state she take over the counter pain meds to help ease the pain.  Numeric Pain Rating Scale and Functional Assessment Average Pain 4   In the last MONTH (on 0-10 scale) has pain interfered with the following?  1. General activity like being  able to carry out your everyday physical activities such as walking, climbing stairs, carrying groceries, or moving a chair?  Rating(9)   +Driver, -BT, -Dye Allergies.

## 2020-04-03 NOTE — Patient Instructions (Signed)

## 2020-04-04 NOTE — Procedures (Signed)
Lumbosacral Transforaminal Epidural Steroid Injection - Sub-Pedicular Approach with Fluoroscopic Guidance  [name]      Date of Birth: 10/17/1950 MRN: [id number] PCP: Isaac Bliss, [name], MD      Visit Date: 04/03/2020   Universal Protocol:    Date/Time: 04/03/2020  Consent Given By: the patient  Position: PRONE  Additional Comments: Vital signs were monitored before and after the procedure. Patient was prepped and draped in the usual sterile fashion. The correct patient, procedure, and site was verified.   Injection Procedure Details:   Procedure diagnoses: Lumbar radiculopathy [M54.16]    Meds Administered:  Meds ordered this encounter  Medications  . dexamethasone (DECADRON) injection 15 mg    Laterality: Bilateral  Location/Site:  L4-L5  Needle:6.0 in., 22 ga.  Short bevel or Quincke spinal needle  Needle Placement: Transforaminal  Findings:    -Comments: Excellent flow of contrast along the nerve, nerve root and into the epidural space.  Procedure Details: After squaring off the end-plates to get a true AP view, the C-arm was positioned so that an oblique view of the foramen as noted above was visualized. The target area is just inferior to the "nose of the scotty dog" or sub pedicular. The soft tissues overlying this structure were infiltrated with 2-3 ml. of 1% Lidocaine without Epinephrine.  The spinal needle was inserted toward the target using a "trajectory" view along the fluoroscope beam.  Under AP and lateral visualization, the needle was advanced so it did not puncture dura and was located close the 6 O'Clock position of the pedical in AP tracterory. Biplanar projections were used to confirm position. Aspiration was confirmed to be negative for CSF and/or blood. A 1-2 ml. volume of Isovue-250 was injected and flow of contrast was noted at each level. Radiographs were obtained for documentation purposes.   After attaining the  desired flow of contrast documented above, a 0.5 to 1.0 ml test dose of 0.25% Marcaine was injected into each respective transforaminal space.  The patient was observed for 90 seconds post injection.  After no sensory deficits were reported, and normal lower extremity motor function was noted,   the above injectate was administered so that equal amounts of the injectate were placed at each foramen (level) into the transforaminal epidural space.   Additional Comments:  The patient tolerated the procedure well Dressing: 2 x 2 sterile gauze and Band-Aid    Post-procedure details: Patient was observed during the procedure. Post-procedure instructions were reviewed.  Patient left the clinic in stable condition.

## 2020-04-04 NOTE — Progress Notes (Signed)
Amy Hooper - 70 y.o. female MRN [id number]  Date of birth: 1950-11-20  Office Visit Note: Visit Date: 04/03/2020 PCP: Isaac Bliss, [name], MD Referred by: Isaac Bliss, Estel*  Subjective: Chief Complaint  Patient presents with  . Lower Back - Pain  . Left Leg - Pain  . Right Leg - Pain   HPI:  Amy Hooper is a 70 y.o. female who comes in today at the request of Dr. Laurence Spates for planned Bilateral L4-L5 Lumbar epidural steroid injection with fluoroscopic guidance.  The patient has failed conservative care including home exercise, medications, time and activity modification.  This injection will be diagnostic and hopefully therapeutic.  Please see requesting physician notes for further details and justification.  Prior diagnostic facet joint blocks by Dr. Normajean Glasgow.  Those injections helped her lower back at the time that she was not having radicular pain.  She has not had radiofrequency ablation.  His note is reviewed today.  MRI reviewed with images and spine model.  MRI reviewed in the note below.   ROS Otherwise per HPI.  Assessment & Plan: Visit Diagnoses:    ICD-10-CM   1. Lumbar radiculopathy  M54.16 XR C-ARM NO REPORT    Epidural Steroid injection    dexamethasone (DECADRON) injection 15 mg    Plan: No additional findings.   Meds & Orders:  Meds ordered this encounter  Medications  . dexamethasone (DECADRON) injection 15 mg    Orders Placed This Encounter  Procedures  . XR C-ARM NO REPORT  . Epidural Steroid injection    Follow-up: Return if symptoms worsen or fail to improve.   Procedures: No procedures performed  Lumbosacral Transforaminal Epidural Steroid Injection - Sub-Pedicular Approach with Fluoroscopic Guidance  [name]      Date of Birth: 1950-04-30 MRN: [id number] PCP: Isaac Bliss, [name], MD      Visit Date: 04/03/2020   Universal Protocol:    Date/Time: 04/03/2020  Consent Given  By: the patient  Position: PRONE  Additional Comments: Vital signs were monitored before and after the procedure. Patient was prepped and draped in the usual sterile fashion. The correct patient, procedure, and site was verified.   Injection Procedure Details:   Procedure diagnoses: Lumbar radiculopathy [M54.16]    Meds Administered:  Meds ordered this encounter  Medications  . dexamethasone (DECADRON) injection 15 mg    Laterality: Bilateral  Location/Site:  L4-L5  Needle:6.0 in., 22 ga.  Short bevel or Quincke spinal needle  Needle Placement: Transforaminal  Findings:    -Comments: Excellent flow of contrast along the nerve, nerve root and into the epidural space.  Procedure Details: After squaring off the end-plates to get a true AP view, the C-arm was positioned so that an oblique view of the foramen as noted above was visualized. The target area is just inferior to the "nose of the scotty dog" or sub pedicular. The soft tissues overlying this structure were infiltrated with 2-3 ml. of 1% Lidocaine without Epinephrine.  The spinal needle was inserted toward the target using a "trajectory" view along the fluoroscope beam.  Under AP and lateral visualization, the needle was advanced so it did not puncture dura and was located close the 6 O'Clock position of the pedical in AP tracterory. Biplanar projections were used to confirm position. Aspiration was confirmed to be negative for CSF and/or blood. A 1-2 ml. volume of Isovue-250 was injected and flow of contrast was noted at each level. Radiographs  were obtained for documentation purposes.   After attaining the desired flow of contrast documented above, a 0.5 to 1.0 ml test dose of 0.25% Marcaine was injected into each respective transforaminal space.  The patient was observed for 90 seconds post injection.  After no sensory deficits were reported, and normal lower extremity motor function was noted,   the above injectate was  administered so that equal amounts of the injectate were placed at each foramen (level) into the transforaminal epidural space.   Additional Comments:  The patient tolerated the procedure well Dressing: 2 x 2 sterile gauze and Band-Aid    Post-procedure details: Patient was observed during the procedure. Post-procedure instructions were reviewed.  Patient left the clinic in stable condition.      Clinical History: MRI LUMBAR SPINE WITHOUT CONTRAST  TECHNIQUE: Multiplanar, multisequence MR imaging of the lumbar spine was performed. No intravenous contrast was administered.  COMPARISON: None.  FINDINGS: Segmentation: 5 lumbar type vertebral bodies. The last full intervertebral disc space is labeled L5-S1.  Alignment: Normal  Vertebrae: No bone lesions or fracture. A few small scattered hemangiomas are noted.  Conus medullaris: Extends to the bottom of L1 level and appears normal.  Paraspinal and other soft tissues: No significant findings.  Disc levels:  L1-2: No significant findings.  L2-3: No significant findings.  L3-4: Mild to moderate facet disease but no disc protrusions, spinal or foraminal stenosis.  L4-5: Moderate facet disease and mild ligamentum flavum thickening. Possible small synovial cyst on the right side. Mild spinal and bilateral lateral recess stenosis but no foraminal stenosis.  L5-S1: Bulging annulus and central disc protrusion with mass effect on the thecal sac move and possibly irritating both S1 nerve roots. The exiting L5 nerve roots are normal. Moderate facet disease.  IMPRESSION: 1. 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 2. Central disc protrusion at L5-S1 with mass effect on the ventral thecal sac and possible irritation of both S1 nerve roots.   Electronically Signed By: Marijo Sanes M.D. On: 09/04/2016 10:30     Objective:  VS:  HT:    WT:   BMI:     BP:(!) 141/81  HR:70bpm  TEMP: ( )  RESP:   Physical Exam Vitals and nursing note reviewed.  Constitutional:      General: She is not in acute distress.    Appearance: Normal appearance. She is obese. She is not ill-appearing.  HENT:     Head: Normocephalic and atraumatic.     Right Ear: External ear normal.     Left Ear: External ear normal.  Eyes:     Extraocular Movements: Extraocular movements intact.  Cardiovascular:     Rate and Rhythm: Normal rate.     Pulses: Normal pulses.  Pulmonary:     Effort: Pulmonary effort is normal. No respiratory distress.  Abdominal:     General: There is no distension.     Palpations: Abdomen is soft.  Musculoskeletal:        General: Tenderness present.     Cervical back: Neck supple.     Right lower leg: No edema.     Left lower leg: No edema.     Comments: Patient has good distal strength with no pain over the greater trochanters.  No clonus or focal weakness.  Skin:    Findings: No erythema, lesion or rash.  Neurological:     General: No focal deficit present.     Mental Status: She is alert and oriented to  person, place, and time.     Sensory: No sensory deficit.     Motor: No weakness or abnormal muscle tone.     Coordination: Coordination normal.  Psychiatric:        Mood and Affect: Mood normal.        Behavior: Behavior normal.      Imaging: XR C-ARM NO REPORT  Result Date: 04/03/2020 Please see Notes tab for imaging impression.

## 2020-04-05 ENCOUNTER — Other Ambulatory Visit: Payer: Self-pay | Admitting: Nephrology

## 2020-04-05 DIAGNOSIS — N281 Cyst of kidney, acquired: Secondary | ICD-10-CM

## 2020-04-12 ENCOUNTER — Encounter: Payer: Self-pay | Admitting: Physical Medicine and Rehabilitation

## 2020-04-14 ENCOUNTER — Encounter: Payer: Self-pay | Admitting: Neurology

## 2020-04-17 ENCOUNTER — Encounter: Payer: Self-pay | Admitting: Physical Medicine & Rehabilitation

## 2020-04-17 ENCOUNTER — Encounter
Payer: PRIVATE HEALTH INSURANCE | Attending: Physical Medicine & Rehabilitation | Admitting: Physical Medicine & Rehabilitation

## 2020-04-17 ENCOUNTER — Other Ambulatory Visit: Payer: Self-pay

## 2020-04-17 VITALS — BP 142/83 | HR 60 | Temp 98.9°F | Ht 63.5 in | Wt 207.0 lb

## 2020-04-17 DIAGNOSIS — G479 Sleep disorder, unspecified: Secondary | ICD-10-CM | POA: Diagnosis not present

## 2020-04-17 DIAGNOSIS — M172 Bilateral post-traumatic osteoarthritis of knee: Secondary | ICD-10-CM

## 2020-04-17 DIAGNOSIS — M791 Myalgia, unspecified site: Secondary | ICD-10-CM

## 2020-04-17 NOTE — Progress Notes (Signed)
Subjective:    Patient ID: Amy Hooper, female    DOB: 03-12-1951, 70 y.o.   MRN: [id number]  HPI Female with past medical history/past surgical history of OSA, morbid obesity, hypertension, fibromyalgia, depression, gout, CKD, degenerative disc disease-lumbar, bilateral knee surgeries for torn mensici, bilateral shoulder surgeries for rotator cuff presents with bilateral L > R knee pain. Started ~1990. After a fall on her knees.  Progressively getting worse.  Steroid injections improve the pain along with brace.  Ambulation exacerbates the pain.  Moving after prolonged postures exacerbates the pain.  Achy.  Radiates down leg.  Intermittent. Denies associated weakness, numbness.  Tylenol, NSAIDs do not help, except for Celebrex, but stopped due to CKD.  1 fall summer of 2021 falling backward in chair.  Pain limits ambulation.  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 She has had bilateral L4-5 lumbar epidural steroid injections.  She has also had facet injections.  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  Irritation of S1 nerve roots.  She had a left knee steroid injection on 03/18/2020 with Ortho.  She is seen rheumatology as well.  No reviewed from rheumatology, orthopedic surgeon, physiatrist, neurology-plan for knee replacement in December 2022. She does note that she is improving with weight loss and dietary changes.   Pain Inventory Average Pain 10 Pain Right Now 1 My pain is aching  In the last 24 hours, has pain interfered with the following? General activity 4 Relation with others 0 Enjoyment of life 0 What TIME of day is your pain at its worst? night Sleep (in general) Fair  Pain is worse with: walking, bending, sitting and standing Pain improves with: rest Relief from Meds: na  walk without assistance how many minutes can you walk?  20 ability to climb steps?  yes do you drive?  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 Hypothyroidism Mother   . Hyperlipidemia Other   . Kidney failure Father   . Dementia Father   . Gout Father   . Arthritis Father   . Prostate cancer Father    Social History   Socioeconomic History  . Marital status: Married    Spouse name: Not on file  . Number of children: 3  . Years of education: Not on file  . Highest education level: Not on file  Occupational History  . 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 Smoking status: Never Smoker  . Smokeless tobacco: Never Used  Vaping Use  . Vaping Use: Never used  Substance and Sexual Activity  . Alcohol use: Never    Alcohol/week: 0.0 standard drinks  . Drug use: Never  . Sexual activity: Never  Other Topics Concern  . Not on file  Social History Narrative   Lives gives with fiance.     Social Determinants of Health   Financial Resource Strain: Not on file  Food Insecurity: Not on file  Transportation Needs: Not on file  Physical Activity: Not on file  Stress: Not on file  Social Connections: Not on file   Past Surgical History:  Procedure Laterality Date  . KNEE ARTHROSCOPY    . right rotator cuff    . ROTATOR  CUFF REPAIR Left   . TUBAL LIGATION     Past Medical History:  Diagnosis Date  . Chronic kidney disease   . Depression   . Fibromyalgia   . Gout   . HTN (hypertension)   . Insomnia   . Morbid obesity ([place])   . Osteoarthritis   . Palpitation    BP (!) 142/83   Pulse 60   Temp 98.9 F (37.2 C)   Ht 5' 3.5" (1.613 m)   Wt 207 lb (93.9 kg)   SpO2 99%   BMI 36.09 kg/m   Opioid Risk Score:   Fall Risk Score:  `1  Depression screen PHQ 2/9  Depression screen [facility] 2/9 04/17/2020 01/08/2020  Decreased Interest 0 0  Down, Depressed, Hopeless 0 0  PHQ - 2 Score 0 0  Altered sleeping 3 0  Tired,  decreased energy 0 0  Change in appetite 0 0  Feeling bad or failure about yourself  0 0  Trouble concentrating 0 0  Moving slowly or fidgety/restless 0 0  Suicidal thoughts 0 0  PHQ-9 Score 3 0  Difficult doing work/chores Not difficult at all Not difficult at all   Review of Systems  Musculoskeletal: Positive for arthralgias, back pain, gait problem, joint swelling and myalgias.       Arm pain Leg pain  Neurological: Negative for weakness.  Psychiatric/Behavioral: Positive for sleep disturbance.  All other systems reviewed and are negative.     Objective:   Physical Exam Constitutional: No distress . Vital signs reviewed. HENT: Normocephalic.  Atraumatic. Eyes: EOMI. No discharge. Cardiovascular: No JVD.  Marland Kitchen Respiratory: Normal effort.  No stridor.   GI: Non-distended.   Skin: Warm and dry.  Intact. Psych: Normal mood.  Normal behavior. Musc:  Right knee TTP, pain with ROM No pain in left knee (recent steroid injection). Neuro: Alert Motor: 5/5 throughout b/l LE Sensation intact to light touch    Assessment & Plan:  Female with past medical history/past surgical history of OSA, morbid obesity, hypertension, fibromyalgia, depression, gout, CKD, degenerative disc disease-lumbar, bilateral knee surgeries for torn mensici, bilateral shoulder surgeries for rotator cuff presents with bilateral L > R knee pain.   1. Bilateral knee OA - endstage  Patient was supposed to have knee replacements last year, but due to insurance changed to summer 2022  Endstage OA in b/l knee per Ortho, no films available  Labs reviewed  Chart/Referral information reviewed - knee OA  PMAWARE reviewed  No benefit with Heat/Cold, PT (~2019), Robaxin, Flexaril, Tizanidine  Will order TENS  Continue bracing prn (limit on right knee - educated)  Continue Voltaren gel  Trial Lidocaine patch OTC  Cymbalta per PCP (plan to d/c)  Patient states main goal is to ambulate  Continue follow up with aquatic  therapy  Recommended follow up with Nephro regarding Chondroitin sulfate  Encouraged exercise as tolerated  Encouraged antiinflammatory diet  2. Sleep disturbance  Lunesta per PCP  Follow up regarding sleep study  3. Morbid Obesity  Continue follow up with dietitian  4. Myalgia   Will consider trigger point injections

## 2020-04-21 ENCOUNTER — Encounter: Payer: Self-pay | Admitting: Internal Medicine

## 2020-04-22 ENCOUNTER — Encounter: Payer: Self-pay | Admitting: Internal Medicine

## 2020-04-22 ENCOUNTER — Ambulatory Visit (INDEPENDENT_AMBULATORY_CARE_PROVIDER_SITE_OTHER): Payer: No Typology Code available for payment source | Admitting: Internal Medicine

## 2020-04-22 ENCOUNTER — Other Ambulatory Visit: Payer: Self-pay

## 2020-04-22 VITALS — Temp 97.9°F | Wt 208.4 lb

## 2020-04-22 DIAGNOSIS — G479 Sleep disorder, unspecified: Secondary | ICD-10-CM

## 2020-04-22 DIAGNOSIS — Z20822 Contact with and (suspected) exposure to covid-19: Secondary | ICD-10-CM | POA: Diagnosis not present

## 2020-04-22 DIAGNOSIS — N1831 Chronic kidney disease, stage 3a: Secondary | ICD-10-CM

## 2020-04-22 DIAGNOSIS — M5136 Other intervertebral disc degeneration, lumbar region: Secondary | ICD-10-CM

## 2020-04-22 DIAGNOSIS — I10 Essential (primary) hypertension: Secondary | ICD-10-CM | POA: Diagnosis not present

## 2020-04-22 NOTE — Telephone Encounter (Signed)
Patient had virtual visit 04/22/20

## 2020-04-22 NOTE — Progress Notes (Signed)
Virtual Visit via Video Note  I connected with Amy Hooper on 04/22/20 at  7:30 AM EST by a video enabled telemedicine application and verified that I am speaking with the correct person using two identifiers.  Location patient: home Location provider: work office Persons participating in the virtual visit: patient, provider  I discussed the limitations of evaluation and management by telemedicine and the availability of in person appointments. The patient expressed understanding and agreed to proceed.   HPI: This visit was initially scheduled in person as follow-up for chronic conditions, however upon check-in she described rhinorrhea and sore throat and this was rescheduled as a virtual visit.  She states that over the weekend she had some runny nose and has been having a sore throat since.  This has been going on for about 3 to 4 days.  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  Unfortunately her coworker that works less than 6 feet apart from her did test positive.  She tells me that since I last saw her she has seen a nephrologist and has been taken off her Celebrex due to her CKD stage III.  Subsequent to this she has been having flareups of her degenerative disc disease pain.  She has seen an orthopedist and has received epidural steroid injections.  She has been sent to PM&R, they are considering trigger point injections or other treatment for fibromyalgia.  She has her home sleep study scheduled for next week for work-up for obstructive sleep apnea.  She is fully vaccinated against COVID.  She had her flu vaccine in October 2021.   ROS: Constitutional: Denies fever, chills, diaphoresis, appetite change and fatigue.  HEENT: Denies photophobia, eye pain, redness, hearing loss, ear pain, congestion, sore throat, rhinorrhea, sneezing, mouth sores, trouble swallowing, neck pain, neck stiffness and tinnitus.   Respiratory: Denies SOB,  DOE, cough, chest tightness,  and wheezing.   Cardiovascular: Denies chest pain, palpitations and leg swelling.  Gastrointestinal: Denies nausea, vomiting, abdominal pain, diarrhea, constipation, blood in stool and abdominal distention.  Genitourinary: Denies dysuria, urgency, frequency, hematuria, flank pain and difficulty urinating.  Endocrine: Denies: hot or cold intolerance, sweats, changes in hair or nails, polyuria, polydipsia. Musculoskeletal: Denies myalgias, back pain, joint swelling, arthralgias and gait problem.  Skin: Denies pallor, rash and wound.  Neurological: Denies dizziness, seizures, syncope, weakness, light-headedness, numbness and headaches.  Hematological: Denies adenopathy. Easy bruising, personal or family bleeding history  Psychiatric/Behavioral: Denies suicidal ideation, mood changes, confusion, nervousness, sleep disturbance and agitation   Past Medical History:  Diagnosis Date  . Chronic kidney disease   . Depression   . Fibromyalgia   . Gout   . HTN (hypertension)   . Insomnia   . Morbid obesity ([place])   . Osteoarthritis   . Palpitation     Past Surgical History:  Procedure Laterality Date  . KNEE ARTHROSCOPY    . right rotator cuff    . ROTATOR CUFF REPAIR Left   . TUBAL LIGATION      Family History  Problem Relation Age of Onset  . Hypothyroidism Mother   . Hyperlipidemia Other   . Kidney failure Father   . Dementia Father   . Gout Father   . Arthritis Father   . Prostate cancer Father     SOCIAL HX:   reports that she has never smoked. She has never used smokeless tobacco. She reports that she does not drink  alcohol and does not use drugs.   Current Outpatient Medications:  .  acetaminophen (TYLENOL) 500 MG tablet, Take 500 mg by mouth every 6 (six) hours as needed., Disp: , Rfl:  .  allopurinol (ZYLOPRIM) 100 MG tablet, TAKE 2 TABLETS BY MOUTH ONCE DAILY, Disp: 180 tablet, Rfl: 1 .  amLODipine (NORVASC) 5 MG tablet, TAKE 1 TABLET BY  MOUTH EVERY DAY, Disp: 90 tablet, Rfl: 1 .  atorvastatin (LIPITOR) 10 MG tablet, Take 10 mg by mouth at bedtime., Disp: , Rfl:  .  diclofenac Sodium (VOLTAREN) 1 % GEL, Apply topically 4 (four) times daily., Disp: , Rfl:  .  DULoxetine (CYMBALTA) 30 MG capsule, TAKE 1 CAPSULE BY MOUTH EVERY DAY, Disp: 90 capsule, Rfl: 1 .  eszopiclone (LUNESTA) 2 MG TABS tablet, TAKE 1 TABLET BY MOUTH AT NIGHT IMMEDIATELY BEFORE BEDTIME, Disp: 30 tablet, Rfl: 2 .  fluticasone (FLONASE) 50 MCG/ACT nasal spray, Place into both nostrils daily., Disp: , Rfl:  .  loratadine (CLARITIN) 10 MG tablet, Take 10 mg by mouth daily., Disp: , Rfl:  .  losartan (COZAAR) 50 MG tablet, TAKE 1 TABLET BY MOUTH EVERY DAY, Disp: 90 tablet, Rfl: 1 .  Multiple Vitamins-Minerals (CENTRUM SILVER PO), Take 1 tablet by mouth daily., Disp: , Rfl:   EXAM:   VITALS per patient if applicable: None reported  GENERAL: alert, oriented, appears well and in no acute distress, hoarse voice  HEENT: atraumatic, conjunttiva clear, no obvious abnormalities on inspection of external nose and ears  NECK: normal movements of the head and neck  LUNGS: on inspection no signs of respiratory distress, breathing rate appears normal, no obvious gross increased work of breathing, gasping or wheezing  CV: no obvious cyanosis  MS: moves all visible extremities without noticeable abnormality  PSYCH/NEURO: pleasant and cooperative, no obvious depression or anxiety, speech and thought processing grossly intact  ASSESSMENT AND PLAN:   Suspected COVID-19 virus infection -She will get a Covid PCR today and report back with results. -OTC symptom management recommended for now including Tylenol, antihistamines, nasal decongestants and cough suppressants as needed.  Sleep disturbance -She has a home sleep study scheduled for next week  Primary hypertension -It appears her average per report is around 130/80, may benefit from reducing blood pressure even  more, will address at next in-person visit, in the meantime she will start to do routine ambulatory measurements and bring these into her next physical appointment.  Stage 3a chronic kidney disease ([place]) -Followed by nephrology, creatinine remains around 1.1 range.  Morbid obesity ([place]) -Discussed healthy lifestyle, including increased physical activity and better food choices to promote weight loss. -She continues her weight loss efforts, she tells me she has lost about 4 pounds.  DDD (degenerative disc disease), lumbar -Status post epidural injections.     I discussed the assessment and treatment plan with the patient. The patient was provided an opportunity to ask questions and all were answered. The patient agreed with the plan and demonstrated an understanding of the instructions.   The patient was advised to call back or seek an in-person evaluation if the symptoms worsen or if the condition fails to improve as anticipated.    Lelon Frohlich, MD  [facility]

## 2020-04-23 ENCOUNTER — Encounter: Payer: Self-pay | Admitting: Internal Medicine

## 2020-04-24 MED ORDER — BENZONATATE 100 MG PO CAPS: 100.0000 mg | ORAL_CAPSULE | Freq: Two times a day (BID) | ORAL | 0 refills | Status: DC | PRN

## 2020-04-27 ENCOUNTER — Encounter: Payer: Self-pay | Admitting: Orthopaedic Surgery

## 2020-04-28 ENCOUNTER — Ambulatory Visit (INDEPENDENT_AMBULATORY_CARE_PROVIDER_SITE_OTHER): Payer: No Typology Code available for payment source | Admitting: Neurology

## 2020-04-28 DIAGNOSIS — R0683 Snoring: Secondary | ICD-10-CM

## 2020-04-28 DIAGNOSIS — R351 Nocturia: Secondary | ICD-10-CM

## 2020-04-28 DIAGNOSIS — G4719 Other hypersomnia: Secondary | ICD-10-CM

## 2020-04-28 DIAGNOSIS — G4733 Obstructive sleep apnea (adult) (pediatric): Secondary | ICD-10-CM | POA: Diagnosis not present

## 2020-04-28 DIAGNOSIS — E669 Obesity, unspecified: Secondary | ICD-10-CM

## 2020-04-28 DIAGNOSIS — Z820 Family history of epilepsy and other diseases of the nervous system: Secondary | ICD-10-CM

## 2020-04-29 ENCOUNTER — Other Ambulatory Visit: Payer: Self-pay | Admitting: Internal Medicine

## 2020-04-29 ENCOUNTER — Other Ambulatory Visit: Payer: No Typology Code available for payment source

## 2020-04-29 ENCOUNTER — Encounter: Payer: Self-pay | Admitting: Internal Medicine

## 2020-04-29 DIAGNOSIS — U071 COVID-19: Secondary | ICD-10-CM

## 2020-04-29 MED ORDER — MOLNUPIRAVIR 200 MG PO CAPS: 800.0000 mg | ORAL_CAPSULE | Freq: Two times a day (BID) | ORAL | 0 refills | Status: DC

## 2020-04-29 MED FILL — MOLNUPIRAVIR 200 MG CAPS: 200 | 5 days supply | Qty: 40 | Fill #0

## 2020-04-30 NOTE — Progress Notes (Signed)
° °

## 2020-05-01 NOTE — Addendum Note (Signed)
Addended by: Star Age on: 05/01/2020 05:17 PM   Modules accepted: Orders

## 2020-05-01 NOTE — Procedures (Signed)
   [facility] TEST (Watch PAT)  STUDY DATE: 04/28/20  DOB: April 09, 1950  MRN: [id number]  ORDERING CLINICIAN: Star Age, MD, PhD   REFERRING CLINICIAN: Isaac Bliss, [name], MD   CLINICAL INFORMATION/HISTORY: 70 year old woman with a history of osteoarthritis with status post bilateral knee arthroscopic surgeries, bilateral shoulder arthroscopic surgeries, hypertension, gout, depression, fibromyalgia, degenerative disc disease of the lumbar spine, chronic kidney disease, palpitations and obesity, who reports snoring and excessive daytime somnolence.  Epworth sleepiness score: 14/24.  BMI: 35.9 kg/m  Neck Circumference: 13 5/8"  FINDINGS:   Total Record Time (hours, min): 7 H 24 min  Total Sleep Time (hours, min):  5 H 57 min   Percent REM (%):    18.04 %   Calculated pAHI (per hour): 18.2       REM pAHI: 30.8    NREM pAHI: 15.4 Supine AHI: 20.0   Oxygen Saturation (%) Mean: 93  Minimum oxygen saturation (%):         84   O2 Saturation Range (%): 84-97  O2Saturation (minutes) <=88%: 2.4   Pulse Mean (bpm):    66  Pulse Range (49-98)   IMPRESSION: OSA (obstructive sleep apnea)  RECOMMENDATION:  This home sleep test demonstrates moderate obstructive sleep apnea with a total AHI of 18.2/hour and O2 nadir of 84%.  Snoring was noted and appeared to be in the mild to moderate range.  Treatment with positive airway pressure is recommended. The patient will be advised to proceed with an autoPAP titration/trial at home for now. A full night titration study may be considered to optimize treatment settings, if needed down the road. Please note that untreated obstructive sleep apnea may carry additional perioperative morbidity. Patients with significant obstructive sleep apnea should receive perioperative PAP therapy and the surgeons and particularly the anesthesiologist should be informed of the diagnosis and the severity of the sleep disordered  breathing. The patient should be cautioned not to drive, work at heights, or operate dangerous or heavy equipment when tired or sleepy. Review and reiteration of good sleep hygiene measures should be pursued with any patient. Other causes of the patient's symptoms, including circadian rhythm disturbances, an underlying mood disorder, medication effect and/or an underlying medical problem cannot be ruled out based on this test. Clinical correlation is recommended. The patient and her referring provider will be notified of the test results. The patient will be seen in follow up in sleep clinic at [facility].  I certify that I have reviewed the raw data recording prior to the issuance of this report in accordance with the standards of the American Academy of Sleep Medicine (AASM).  INTERPRETING PHYSICIAN:  Star Age, MD, PhD  Board Certified in Neurology and Sleep Medicine [facility] [address] [place], [place] [address] [phone]

## 2020-05-06 ENCOUNTER — Ambulatory Visit (INDEPENDENT_AMBULATORY_CARE_PROVIDER_SITE_OTHER): Payer: No Typology Code available for payment source | Admitting: Orthopaedic Surgery

## 2020-05-06 ENCOUNTER — Encounter: Payer: Self-pay | Admitting: Orthopaedic Surgery

## 2020-05-06 ENCOUNTER — Ambulatory Visit (INDEPENDENT_AMBULATORY_CARE_PROVIDER_SITE_OTHER): Payer: No Typology Code available for payment source

## 2020-05-06 DIAGNOSIS — M25551 Pain in right hip: Secondary | ICD-10-CM

## 2020-05-06 MED ORDER — BUPIVACAINE HCL 0.5 % IJ SOLN
3.0000 mL | INTRAMUSCULAR | Status: AC | PRN
  Administered 2020-05-06: 3 mL via INTRA_ARTICULAR

## 2020-05-06 MED ORDER — LIDOCAINE HCL 1 % IJ SOLN
3.0000 mL | INTRAMUSCULAR | Status: AC | PRN
  Administered 2020-05-06: 3 mL

## 2020-05-06 MED ORDER — METHYLPREDNISOLONE ACETATE 40 MG/ML IJ SUSP
40.0000 mg | INTRAMUSCULAR | Status: AC | PRN
  Administered 2020-05-06: 40 mg via INTRA_ARTICULAR

## 2020-05-06 NOTE — Progress Notes (Signed)
Office Visit Note   [name]           Date of Birth: 02-19-1951           MRN: [id number] Visit Date: 05/06/2020              Requested by: Isaac Bliss, [name], MD [place],  [place] [id number] PCP: Isaac Bliss, [name], MD   Assessment & Plan: Visit Diagnoses:  1. Pain in right hip     Plan: Impression is right lateral hip pain.  Sounds like she may have some bursitis or possibly some referred pain from the hip joint.  Greater trochanter bursal injection performed today.  IT band stretches provided as well.  She has been able to lose weight which has helped with her knee pain.  We will see her back as needed.  Follow-Up Instructions: Return if symptoms worsen or fail to improve.   Orders:  Orders Placed This Encounter  Procedures  . XR HIP UNILAT W OR W/O PELVIS 2-3 VIEWS RIGHT   No orders of the defined types were placed in this encounter.     Procedures: Large Joint Inj: R greater trochanter on 05/06/2020 8:53 AM Indications: pain Details: 22 G needle  Arthrogram: No  Medications: 3 mL lidocaine 1 %; 3 mL bupivacaine 0.5 %; 40 mg methylPREDNISolone acetate 40 MG/ML Patient was prepped and draped in the usual sterile fashion.       Clinical Data: No additional findings.   Subjective: Chief Complaint  Patient presents with  . Right Hip - Pain    Amy Hooper is a very pleasant 70 year old female comes in for evaluation of right hip pain started about 3 weeks ago.  She thinks that she feels a mass in her right hip as well.  Denies any numbness or tingling or radicular symptoms.  She feels a burning pain specially when there is weather changes.   Review of Systems  Constitutional: Negative.   HENT: Negative.   Eyes: Negative.   Respiratory: Negative.   Cardiovascular: Negative.   Endocrine: Negative.   Musculoskeletal: Negative.   Neurological: Negative.   Hematological: Negative.    Psychiatric/Behavioral: Negative.   All other systems reviewed and are negative.    Objective: Vital Signs: There were no vitals taken for this visit.  Physical Exam Vitals and nursing note reviewed.  Constitutional:      Appearance: She is well-developed and well-nourished.  Pulmonary:     Effort: Pulmonary effort is normal.  Skin:    General: Skin is warm.     Capillary Refill: Capillary refill takes less than 2 seconds.  Neurological:     Mental Status: She is alert and oriented to person, place, and time.  Psychiatric:        Mood and Affect: Mood and affect normal.        Behavior: Behavior normal.        Thought Content: Thought content normal.        Judgment: Judgment normal.     Ortho Exam Right hip shows good range of motion without any real pain.  Negative sciatic tension signs.  She is tender palpation over the lateral side of the hip.  I am not able to appreciate any asymmetry or palpable masses compared to the contralateral hip.  She does not have any pain with hip abduction against gravity or resistance. Specialty Comments:  No specialty comments available.  Imaging: XR HIP UNILAT W  OR W/O PELVIS 2-3 VIEWS RIGHT  Result Date: 05/06/2020 No acute abnormalities.  Mild hip osteoarthritis.  Slight irregularity of the greater trochanter.    PMFS History: Patient Active Problem List   Diagnosis Date Noted  . 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/17/2020  . Sleep disturbance 04/17/2020  . Chronic pain of both shoulders 03/03/2020  . Pain in both hands 03/03/2020  . Primary osteoarthritis of both knees 03/03/2020  . Pain in both feet 03/03/2020  . History of chronic kidney disease 03/03/2020  . DDD (degenerative disc disease), lumbar 02/05/2020  . Vitamin D deficiency 01/09/2020  . Hyperlipidemia 01/09/2020  . CKD (chronic kidney disease) stage 3, GFR 30-59 ml/min (HCC) 01/09/2020  . HTN (hypertension)   . Morbid obesity ([place])   . Gout   .  Fibromyalgia   . Depression   . Insomnia    Past Medical History:  Diagnosis Date  . Chronic kidney disease   . Depression   . Fibromyalgia   . Gout   . HTN (hypertension)   . Insomnia   . Morbid obesity ([place])   . Osteoarthritis   . Palpitation     Family History  Problem Relation Age of Onset  . Hypothyroidism Mother   . Hyperlipidemia Other   . Kidney failure Father   . Dementia Father   . Gout Father   . Arthritis Father   . Prostate cancer Father     Past Surgical History:  Procedure Laterality Date  . KNEE ARTHROSCOPY    . right rotator cuff    . ROTATOR CUFF REPAIR Left   . TUBAL LIGATION     Social History   Occupational History  . 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 Smoking status: Never Smoker  . Smokeless tobacco: Never Used  Vaping Use  . Vaping Use: Never used  Substance and Sexual Activity  . Alcohol use: Never    Alcohol/week: 0.0 standard drinks  . Drug use: Never  . Sexual activity: Never

## 2020-05-07 ENCOUNTER — Telehealth: Payer: Self-pay

## 2020-05-07 NOTE — Telephone Encounter (Signed)
I called pt. I advised pt that Dr. Rexene Alberts reviewed their sleep study results and found that pt has moderate osa. Dr. Rexene Alberts recommends that pt start a autopap at home for treatment. I reviewed PAP compliance expectations with the pt. Pt is agreeable to starting an auto-PAP. I advised pt that an order will be sent to a DME, [employer], and [employer] will call the pt within about one week after they file with the pt's insurance. [employer] will show the pt how to use the machine, fit for masks, and troubleshoot the auto-PAP if needed. A follow up appt was made for insurance purposes with Dr. Rexene Alberts on 07/10/20 at 730 am. Pt verbalized understanding to arrive 15 minutes early and bring their auto-PAP. A letter with all of this information in it will be mailed to the pt as a reminder. I verified with the pt that the address we have on file is correct. Pt verbalized understanding of results. Pt had no questions at this time but was encouraged to call back if questions arise. I have sent the order to [employer] and have received confirmation that they have received the order.

## 2020-05-07 NOTE — Telephone Encounter (Signed)
-----   Message from Star Age, MD sent at 05/01/2020  5:17 PM EST ----- Patient referred by PCP, seen by me on 03/31/20, patient had a HST on 04/28/20.    Please call and notify the patient that the recent home sleep test showed obstructive sleep apnea in the moderate range. I recommend treatment in the form of autoPAP, which means, that we don't have to bring her in for a sleep study with CPAP, but will let her start using a so called autoPAP machine at home, which is a CPAP-like machine with self-adjusting pressures. We will send the order to a local DME company (of her choice, or as per insurance requirement). The DME representative will fit her with a mask, educate her on how to use the machine, how to put the mask on, etc. I have placed an order in the chart. Please send the order, talk to patient, send report to referring MD. We will need a FU in sleep clinic for 10 weeks post-PAP set up, please arrange that with me or one of our NPs. Also reinforce the need for compliance with treatment. Thanks,   Star Age, MD, PhD [facility])

## 2020-05-19 ENCOUNTER — Other Ambulatory Visit: Payer: Self-pay

## 2020-05-19 ENCOUNTER — Encounter: Payer: Self-pay | Admitting: Physical Medicine & Rehabilitation

## 2020-05-19 ENCOUNTER — Encounter
Payer: PRIVATE HEALTH INSURANCE | Attending: Physical Medicine & Rehabilitation | Admitting: Physical Medicine & Rehabilitation

## 2020-05-19 VITALS — BP 135/83 | HR 72 | Temp 99.0°F | Ht 62.0 in | Wt 207.0 lb

## 2020-05-19 DIAGNOSIS — M797 Fibromyalgia: Secondary | ICD-10-CM | POA: Diagnosis present

## 2020-05-19 DIAGNOSIS — M172 Bilateral post-traumatic osteoarthritis of knee: Secondary | ICD-10-CM

## 2020-05-19 DIAGNOSIS — G4733 Obstructive sleep apnea (adult) (pediatric): Secondary | ICD-10-CM | POA: Diagnosis present

## 2020-05-19 MED ORDER — PREGABALIN 75 MG PO CAPS: 75.0000 mg | ORAL_CAPSULE | Freq: Three times a day (TID) | ORAL | 1 refills | Status: DC

## 2020-05-19 NOTE — Progress Notes (Signed)
Subjective:    Patient ID: Amy Hooper, female    DOB: 10-23-1950, 70 y.o.   MRN: [id number]  HPI Female with past medical history/past surgical history of OSA, morbid obesity, hypertension, fibromyalgia, depression, gout, CKD, degenerative disc disease-lumbar, bilateral knee surgeries for torn mensici, bilateral shoulder surgeries for rotator cuff presents with bilateral L > R knee pain.  Initially stated: Started ~1990. After a fall on her knees.  Progressively getting worse.  Steroid injections improve the pain along with brace.  Ambulation exacerbates the pain.  Moving after prolonged postures exacerbates the pain.  Achy.  Radiates down leg.  Intermittent. Denies associated weakness, numbness.  Tylenol, NSAIDs do not help, except for Celebrex, but stopped due to CKD.  1 fall summer of 2021 falling backward in chair.  Pain limits ambulation.  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 She has had bilateral L4-5 lumbar epidural steroid injections.  She has also had facet injections.  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  Irritation of S1 nerve roots.  She had a left knee steroid injection on 03/18/2020 with Ortho.  She is seen rheumatology as well.  No reviewed from rheumatology, orthopedic surgeon, physiatrist, neurology-plan for knee replacement in December 2022. She does note that she is improving with weight loss and dietary changes.   Last clinic visit on 04/17/20.  Since that time, she states she did not receive a TENS unit. She states she did want to try Lidocaine patch because she has general pain.  She want evaluation for Fibromyalgia. She has not followed up with Nephro regarding Chondroitin sulfate.  She notes improvement with antiinflamatory diet. She had a sleep study was diagnosed with OSA.  She is going to be fitted for CPAP. She notes she is  losing weight.   Pain Inventory Average Pain 7 Pain Right Now 7 My pain is constant, burning and aching  In the last 24 hours, has pain interfered with the following? General activity 9 Relation with others 9 Enjoyment of life 9 What TIME of day is your pain at its worst? morning , evening and night Sleep (in general) Poor  Pain is worse with: walking, bending, sitting and standing Pain improves with: rest, pacing activities, medication and injections Relief from Meds: 7  walk without assistance how many minutes can you walk? 20 ability to climb steps?  yes do you drive?  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  no  Any changes since last visit?  no    Family History  Problem Relation Age of Onset  . Hypothyroidism Mother   . Hyperlipidemia Other   . Kidney failure Father   . Dementia Father   . Gout Father   . Arthritis Father   . Prostate cancer Father    Social History   Socioeconomic History  . Marital status: Married    Spouse name: Not on file  . Number of children: 3  . Years of education: Not on file  . Highest education level: Not on file  Occupational History  . 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 Smoking status: Never Smoker  . Smokeless tobacco: Never Used  Vaping Use  . Vaping Use: Never used  Substance and Sexual Activity  . Alcohol use: Never  Alcohol/week: 0.0 standard drinks  . Drug use: Never  . Sexual activity: Never  Other Topics Concern  . Not on file  Social History Narrative   Lives gives with fiance.     Social Determinants of Health   Financial Resource Strain: Not on file  Food Insecurity: Not on file  Transportation Needs: Not on file  Physical Activity: Not on file  Stress: Not on file  Social Connections: Not on file   Past Surgical History:  Procedure Laterality Date  . KNEE ARTHROSCOPY    . right rotator cuff    . ROTATOR CUFF REPAIR Left   .  TUBAL LIGATION     Past Medical History:  Diagnosis Date  . Chronic kidney disease   . Depression   . Fibromyalgia   . Gout   . HTN (hypertension)   . Insomnia   . Morbid obesity ([place])   . Osteoarthritis   . Palpitation    BP 135/83   Pulse 72   Temp 99 F (37.2 C)   Ht 5\' 2"  (1.575 m)   Wt 207 lb (93.9 kg)   SpO2 98%   BMI 37.86 kg/m   Opioid Risk Score:   Fall Risk Score:  `1  Depression screen PHQ 2/9  Depression screen [facility] 2/9 04/22/2020 04/17/2020 01/08/2020  Decreased Interest 0 0 0  Down, Depressed, Hopeless 0 0 0  PHQ - 2 Score 0 0 0  Altered sleeping 1 3 0  Tired, decreased energy 0 0 0  Change in appetite 0 0 0  Feeling bad or failure about yourself  0 0 0  Trouble concentrating 0 0 0  Moving slowly or fidgety/restless 0 0 0  Suicidal thoughts 0 0 0  PHQ-9 Score 1 3 0  Difficult doing work/chores Not difficult at all Not difficult at all Not difficult at all   Review of Systems  Constitutional: Negative.   HENT: Negative.   Eyes: Negative.   Respiratory: Negative.   Cardiovascular: Negative.   Gastrointestinal: Negative.   Endocrine: Negative.   Genitourinary: Negative.   Musculoskeletal: Positive for arthralgias, back pain, gait problem, joint swelling, myalgias, neck pain and neck stiffness.       Arm pain Leg pain  Allergic/Immunologic: Negative.   Hematological: Negative.   Psychiatric/Behavioral: Positive for sleep disturbance.  All other systems reviewed and are negative.     Objective:   Physical Exam  Constitutional: No distress . Vital signs reviewed. HENT: Normocephalic.  Atraumatic. Eyes: EOMI. No discharge. Cardiovascular: No JVD.   Respiratory: Normal effort.  No stridor.   GI: Non-distended.   Skin: Warm and dry.  Intact. Psych: Normal mood.  Normal behavior. Musc:  Scattered points of tenderness Neuro: Alert Motor: 5/5 throughout b/l LE Sensation intact to light touch    Assessment & Plan:  Female with past medical  history/past surgical history of OSA, morbid obesity, hypertension, fibromyalgia, depression, gout, CKD, degenerative disc disease-lumbar, bilateral knee surgeries for torn mensici, bilateral shoulder surgeries for rotator cuff presents with bilateral L > R knee pain.   1. Bilateral knee OA - endstage  Patient was supposed to have knee replacements last year, but due to insurance changed to summer 2022  Endstage OA in b/l knee per Ortho, no films available  No benefit with Heat/Cold, PT (~2019), Robaxin, Flexaril, Tizanidine  Continue bracing prn (limit on right knee - educated)  Continue Voltaren gel  Lidocaine patch OTC, does not want to try, wanted to focus on generalized  pain  Continue Cymbalta   Patient states main goal is to ambulate  Wants to buy a pool - afraid of public locations due to CoVid  Recommended follow up with Nephro regarding Chondroitin sulfate  Tolerating mild exercise - limited by back   Continue antiinflammatory diet  2. Sleep disturbance  Lunesta per PCP  Awaiting CPAP  3. Morbid Obesity  Continue follow up with dietitian  Continue weight loss  4. Myalgia   Will consider trigger point injections  5. Fibromyalgia  See #1  Will refer for aquatic therapy  Encouraged ROM, stretching  Continue Cymbalta  Will order Lyrica 75 TID

## 2020-05-22 ENCOUNTER — Other Ambulatory Visit: Payer: Self-pay

## 2020-05-23 ENCOUNTER — Ambulatory Visit (INDEPENDENT_AMBULATORY_CARE_PROVIDER_SITE_OTHER): Payer: No Typology Code available for payment source | Admitting: Internal Medicine

## 2020-05-23 ENCOUNTER — Encounter: Payer: Self-pay | Admitting: Internal Medicine

## 2020-05-23 VITALS — BP 110/78 | HR 74 | Temp 98.6°F | Wt 207.3 lb

## 2020-05-23 DIAGNOSIS — E785 Hyperlipidemia, unspecified: Secondary | ICD-10-CM

## 2020-05-23 DIAGNOSIS — G4733 Obstructive sleep apnea (adult) (pediatric): Secondary | ICD-10-CM | POA: Diagnosis not present

## 2020-05-23 DIAGNOSIS — Z87898 Personal history of other specified conditions: Secondary | ICD-10-CM

## 2020-05-23 DIAGNOSIS — N1831 Chronic kidney disease, stage 3a: Secondary | ICD-10-CM

## 2020-05-23 DIAGNOSIS — G4709 Other insomnia: Secondary | ICD-10-CM

## 2020-05-23 DIAGNOSIS — I10 Essential (primary) hypertension: Secondary | ICD-10-CM | POA: Diagnosis not present

## 2020-05-23 DIAGNOSIS — D1720 Benign lipomatous neoplasm of skin and subcutaneous tissue of unspecified limb: Secondary | ICD-10-CM

## 2020-05-23 DIAGNOSIS — M797 Fibromyalgia: Secondary | ICD-10-CM

## 2020-05-23 NOTE — Progress Notes (Signed)
Established Patient Office Visit     This visit occurred during the SARS-CoV-2 public health emergency.  Safety protocols were in place, including screening questions prior to the visit, additional usage of staff PPE, and extensive cleaning of exam room while observing appropriate contact time as indicated for disinfecting solutions.    CC/Reason for Visit: Follow-up chronic medical conditions  HPI: Amy Hooper is a 70 y.o. female who is coming in today for the above mentioned reasons. Past Medical History is significant for: Gout, hypertension, fibromyalgia, depression, insomnia, stage III chronic kidney disease with a baseline creatinine around 1.110 followed by nephrology.  She has been prescribed Lunesta.  She was recently diagnosed with obstructive sleep apnea and is awaiting her CPAP fitting.  She was also recently started on Lyrica for her fibromyalgia, has not yet started.  She was told by her nephrologist that she had some renal cysts and is scheduled for an MRI next week for further characterization.  She was recently diagnosed with COVID-19 viral infection.  She was prescribed Molnupiravir, has recovered nicely.  She remains with significant postnasal drip that is worse in the mornings.  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  She was told in the past do not do anything about them unless they started bothering her.  She has started to have burning pain every night around that area.   Past Medical/Surgical History: Past Medical History:  Diagnosis Date  . Chronic kidney disease   . Depression   . Fibromyalgia   . Gout   . HTN (hypertension)   . Insomnia   . Morbid obesity ([place])   . Osteoarthritis   . Palpitation     Past Surgical History:  Procedure Laterality Date  . KNEE ARTHROSCOPY    . right rotator cuff    . ROTATOR CUFF REPAIR Left   . TUBAL LIGATION      Social History:  reports that she has never smoked. She has never  used smokeless tobacco. She reports that she does not drink alcohol and does not use drugs.  Allergies: Allergies  Allergen Reactions  . Cefuroxime Axetil Rash and Hives  . Clarithromycin Rash and Hives  . Nsaids Nausea And Vomiting  . Percocet [Oxycodone-Acetaminophen] Nausea And Vomiting    Pt. Also states it makes her hallucinate  . Hydrocodone-Acetaminophen Other (See Comments)    Stomach upset  . Other Other (See Comments)    Upset stomach  . Oxycodone-Acetaminophen Other (See Comments)    Hallucination  . Tramadol Nausea And Vomiting and Other (See Comments)    Upset stomach    Family History:  Family History  Problem Relation Age of Onset  . Hypothyroidism Mother   . Hyperlipidemia Other   . Kidney failure Father   . Dementia Father   . Gout Father   . Arthritis Father   . Prostate cancer Father      Current Outpatient Medications:  .  acetaminophen (TYLENOL) 500 MG tablet, Take 500 mg by mouth every 6 (six) hours as needed., Disp: , Rfl:  .  allopurinol (ZYLOPRIM) 100 MG tablet, Take 2 tablets by mouth daily., Disp: , Rfl:  .  amLODipine (NORVASC) 5 MG tablet, TAKE 1 TABLET BY MOUTH EVERY DAY, Disp: 90 tablet, Rfl: 1 .  atorvastatin (LIPITOR) 10 MG tablet, Take 10 mg by mouth at bedtime., Disp: , Rfl:  .  diclofenac Sodium (VOLTAREN) 1 % GEL, Apply topically 4 (four) times daily.,  Disp: , Rfl:  .  DULoxetine (CYMBALTA) 30 MG capsule, TAKE 1 CAPSULE BY MOUTH EVERY DAY, Disp: 90 capsule, Rfl: 1 .  eszopiclone (LUNESTA) 2 MG TABS tablet, TAKE 1 TABLET BY MOUTH AT NIGHT IMMEDIATELY BEFORE BEDTIME, Disp: 30 tablet, Rfl: 2 .  fluticasone (FLONASE) 50 MCG/ACT nasal spray, Place into both nostrils daily., Disp: , Rfl:  .  HYDRALAZINE-HCTZ PO, , Disp: , Rfl:  .  loratadine (CLARITIN) 10 MG tablet, Take 10 mg by mouth daily., Disp: , Rfl:  .  losartan (COZAAR) 50 MG tablet, TAKE 1 TABLET BY MOUTH EVERY DAY, Disp: 90 tablet, Rfl: 1 .  Multiple Vitamins-Minerals (CENTRUM  SILVER PO), Take 1 tablet by mouth daily., Disp: , Rfl:  .  pregabalin (LYRICA) 75 MG capsule, Take 1 capsule (75 mg total) by mouth 3 (three) times daily., Disp: 90 capsule, Rfl: 1  Review of Systems:  Constitutional: Denies fever, chills, diaphoresis, appetite change. HEENT: Denies photophobia, eye pain, redness, hearing loss, ear pain,  sneezing, mouth sores, trouble swallowing, neck pain, neck stiffness and tinnitus.   Respiratory: Denies SOB, DOE, cough, chest tightness,  and wheezing.   Cardiovascular: Denies chest pain, palpitations and leg swelling.  Gastrointestinal: Denies nausea, vomiting, abdominal pain, diarrhea, constipation, blood in stool and abdominal distention.  Genitourinary: Denies dysuria, urgency, frequency, hematuria, flank pain and difficulty urinating.  Endocrine: Denies: hot or cold intolerance, sweats, changes in hair or nails, polyuria, polydipsia. Musculoskeletal: Denies myalgias, back pain, joint swelling, arthralgias and gait problem.  Skin: Denies pallor, rash and wound.  Neurological: Denies dizziness, seizures, syncope, weakness, light-headedness, numbness and headaches.  Hematological: Denies adenopathy. Easy bruising, personal or family bleeding history  Psychiatric/Behavioral: Denies suicidal ideation, mood changes, confusion, nervousness, sleep disturbance and agitation    Physical Exam: Vitals:   05/23/20 0655  BP: 110/78  Pulse: 74  Temp: 98.6 F (37 C)  TempSrc: Oral  SpO2: 99%  Weight: 207 lb 4.8 oz (94 kg)    Body mass index is 37.92 kg/m.   Constitutional: NAD, calm, comfortable, obese Eyes: PERRL, lids and conjunctivae normal, wears corrective lenses ENMT: Mucous membranes are moist.  Respiratory: clear to auscultation bilaterally, no wheezing, no crackles. Normal respiratory effort. No accessory muscle use.  Musculoskeletal: Bilateral lateral ankle lipomas, right is greater than left Neurologic: Grossly intact and  nonfocal Psychiatric: Normal judgment and insight. Alert and oriented x 3. Normal mood.    Impression and Plan:  OSA (obstructive sleep apnea) -Recent diagnosis, she is awaiting CPAP fitting.  Primary hypertension -Well-controlled today.  Morbid obesity ([place]) -Discussed healthy lifestyle, including increased physical activity and better food choices to promote weight loss.  Hyperlipidemia, unspecified hyperlipidemia type -Last LDL was 145 in October 2021, she was started on atorvastatin 10 mg at that time, she will repeat fasting lipids when she returns for CPE.  Fibromyalgia -She was recently started on Lyrica but has not yet started.  Stage 3a chronic kidney disease ([place]) -Stable, followed by nephrology.  Other insomnia -On Lunesta.  Lipoma of lower extremity, unspecified laterality -I wonder if the bilateral burning that she is describing in her lower extremities might be more related to neuropathy and the lipomas.  I have advised that she start the Lyrica and await a few weeks.  If she is still having issues then we can consider referral to podiatry/surgery for excision of lipomas.  History of postnasal drip -This is in the setting of typical spring allergies and recent COVID-19 virus infection. -I have advised  that she continue Claritin daily and give some more time.    Lelon Frohlich, MD [facility]

## 2020-05-24 ENCOUNTER — Encounter: Payer: Self-pay | Admitting: Internal Medicine

## 2020-05-24 DIAGNOSIS — M79671 Pain in right foot: Secondary | ICD-10-CM

## 2020-05-26 ENCOUNTER — Other Ambulatory Visit: Payer: Self-pay | Admitting: Internal Medicine

## 2020-05-26 ENCOUNTER — Encounter: Payer: Self-pay | Admitting: Physical Medicine and Rehabilitation

## 2020-05-26 NOTE — Telephone Encounter (Signed)
She wants to schedule surgery for June.  Thanks.

## 2020-05-30 ENCOUNTER — Ambulatory Visit (HOSPITAL_BASED_OUTPATIENT_CLINIC_OR_DEPARTMENT_OTHER)
Payer: No Typology Code available for payment source | Attending: Physical Medicine & Rehabilitation | Admitting: Physical Therapy

## 2020-05-30 ENCOUNTER — Encounter (HOSPITAL_BASED_OUTPATIENT_CLINIC_OR_DEPARTMENT_OTHER): Payer: Self-pay | Admitting: Physical Therapy

## 2020-05-30 ENCOUNTER — Other Ambulatory Visit: Payer: Self-pay

## 2020-05-30 DIAGNOSIS — M797 Fibromyalgia: Secondary | ICD-10-CM | POA: Diagnosis not present

## 2020-05-30 DIAGNOSIS — M6281 Muscle weakness (generalized): Secondary | ICD-10-CM | POA: Diagnosis present

## 2020-05-30 DIAGNOSIS — R293 Abnormal posture: Secondary | ICD-10-CM

## 2020-05-30 DIAGNOSIS — M79604 Pain in right leg: Secondary | ICD-10-CM | POA: Diagnosis present

## 2020-05-30 DIAGNOSIS — M79605 Pain in left leg: Secondary | ICD-10-CM | POA: Insufficient documentation

## 2020-05-30 NOTE — Therapy (Signed)
[place], [place], [address] Phone: [phone]   Fax:  [phone]  Physical Therapy Evaluation  Patient Details  Name: Amy Hooper MRN: [id number] Date of Birth: Feb 18, 1951 Referring Provider (PT): Jamse Arn, MD   Encounter Date: 05/30/2020   PT End of Session - 05/30/20 0929    Visit Number 1    Number of Visits 17    Date for PT Re-Evaluation 07/25/20    Authorization Type PHCS multiplan    PT Start Time 0930    PT Stop Time 1015    PT Time Calculation (min) 45 min    Activity Tolerance Patient tolerated treatment well    Behavior During Therapy [facility] for tasks assessed/performed           Past Medical History:  Diagnosis Date  . Chronic kidney disease   . Depression   . Fibromyalgia   . Gout   . HTN (hypertension)   . Insomnia   . Morbid obesity ([place])   . Osteoarthritis   . Palpitation     Past Surgical History:  Procedure Laterality Date  . KNEE ARTHROSCOPY    . right rotator cuff    . ROTATOR CUFF REPAIR Left   . TUBAL LIGATION      There were no vitals filed for this visit.    Subjective Assessment - 05/30/20 0933    Subjective Pt states she was diagnosed with fibromylagia and OA. Pt needs bilat knee replacement with DDD in her back. Pt reports constant pain. Pt was taking celebrex which had calmed down the pain but had to stop because of her kidneys. Pt reports nothing she has done has worked to ease pain. Pain specialist suggested movement and aquatics. Pt has been doing stretches at home which has helped. Pt also does a step video which does not aggravate her knee.    Pertinent History Fibromyalgia, OA, DDD, gets regular steroid shots in L knee and R hip    How long can you sit comfortably? Aggravates back; difficulty getting up    How long can you stand comfortably? n/a    How long can you walk comfortably? Does not walk too much due to L knee pain     Patient Stated Goals Increase movement, decrease pain    Currently in Pain? Yes    Pain Score 8     Pain Location Generalized    Pain Descriptors / Indicators Aching    Pain Type Chronic pain    Pain Onset More than a month ago   Dec 2021 when celebrex was stopped   Pain Frequency Constant    Aggravating Factors  Weather, prolonged sitting, prolonged laying down, prolonged walking    Pain Relieving Factors Celebrex, body pillow              [facility] PT Assessment - 05/30/20 0001      Assessment   Medical Diagnosis M79.7 (ICD-10-CM) - Fibromyalgia    Referring Provider (PT) Jamse Arn, MD    Prior Therapy R Shoulder ~1 yr ago      Precautions   Precautions None      Balance Screen   Has the patient fallen in the past 6 months No   But reports [place] residence    Living Arrangements Spouse/significant other    Type of Home House      Prior Function  Level of Independence Independent    Vocation Full time 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)      Observation/Other Assessments   Focus on Therapeutic Outcomes (FOTO)  n/a      Functional Tests   Functional tests Single leg stance      Single Leg Stance   Comments L: 17 sec; R: 4 sec      Posture/Postural Control   Posture/Postural Control Postural limitations    Postural Limitations Forward head;Increased thoracic kyphosis;Anterior pelvic tilt      ROM / Strength   AROM / PROM / Strength AROM;Strength      Strength   Strength Assessment Site Hip;Knee    Right/Left Hip Right;Left    Right Hip Flexion 3+/5    Right Hip Extension 3+/5   Limited AROM   Right Hip ABduction 3+/5   Increased low back pain   Left Hip Flexion 3+/5    Left Hip Extension 3+/5   Limited AROM   Left Hip ABduction 3+/5    Right/Left Knee Right;Left    Right Knee Flexion 4/5    Right Knee Extension 4-/5    Left Knee Flexion  4/5    Left Knee Extension 4/5      Special Tests    Special Tests Lumbar    Lumbar Tests Prone Knee Bend Test;Straight Leg Raise      Prone Knee Bend Test   Findings Positive    Side Right      Straight Leg Raise   Findings Positive    Side  Left      Transfers   Transfers Sit to Stand    Sit to Stand 5: Supervision    Sit to Stand Details (indicate cue type and reason) Slow to stand, requires use of UEs on knees to push into standing    Five time sit to stand comments  To be assessed next session                      Objective measurements completed on examination: See above findings.               PT Education - 05/30/20 1107    Education Details Discussed exam findings and POC. Discussed increased lumbar support for her long car rides to work, stretches, and taking a break halfway.    Person(s) Educated Patient    Methods Explanation;Demonstration;Verbal cues    Comprehension Verbalized understanding;Tactile cues required            PT Short Term Goals - 05/30/20 1126      PT SHORT TERM GOAL #1   Title Pt will be independent with initial HEP    Time 4    Period Weeks    Status New    Target Date 06/27/20      PT SHORT TERM GOAL #2   Title Pt will be able to perform 5x STS without using UEs in <20 sec    Baseline Currently needs use of UEs to come to full standing    Time 4    Period Weeks    Status New    Target Date 06/27/20      PT SHORT TERM GOAL #3   Title Pt will be able to stand on R LE for at least 8 sec to demo improved stability    Baseline 4 sec on R    Time 4  Period Weeks    Status New    Target Date 06/27/20             PT Long Term Goals - 05/30/20 1128      PT LONG TERM GOAL #1   Title Pt will report decrease in general pain by 50%    Time 8    Period Weeks    Status New    Target Date 07/25/20      PT LONG TERM GOAL #2   Title Pt will be able to self manage symptoms with exercises and stretches  independently    Time 8    Period Weeks    Status New    Target Date 07/25/20      PT LONG TERM GOAL #3   Title Pt will demo 5x STS <13 sec for improved functional strength    Time 8    Period Weeks    Status New    Target Date 07/25/20                  Plan - 05/30/20 1110    Clinical Impression Statement Amy Hooper is a 70 y/o F presenting to OPPT with complaints of generalized pain and fibromyalgia limiting overall mobility and QoL. Pt's PMH is significant for bilateral knee OA (plans for L knee replacement this summer), DDD, R shoulder arthroscopy, and HTN. On assessment, pt demos R>L LE and trunk instability with general hip and quad weakness. Pt is most TTP along R SI with hyperactive and hypertonic bilat upper traps and levator scapulae. Pt would benefit from PT to improve her gross mobility and movement without pain for improved transfers, ADLs and work function.    Personal Factors and Comorbidities Age;Comorbidity 1;Comorbidity 3+;Comorbidity 2;Past/Current Experience;Profession;Time since onset of injury/illness/exacerbation    Comorbidities Fibromyalgia, DDD, OA    Examination-Activity Limitations Sit;Stand;Transfers;Locomotion Level;Sleep    Examination-Participation Restrictions Community Activity;Driving;Occupation    Stability/Clinical Decision Making Evolving/Moderate complexity    Clinical Decision Making Moderate    Rehab Potential Good    PT Frequency 2x / week    PT Duration 8 weeks    PT Treatment/Interventions Aquatic Therapy;ADLs/Self Care Home Management;Electrical Stimulation;Traction;Moist Heat;Iontophoresis 4mg /ml Dexamethasone;Gait training;Stair training;Functional mobility training;Therapeutic activities;Therapeutic exercise;Balance training;Neuromuscular re-education;Patient/family education;Manual techniques;Passive range of motion;Dry needling;Taping;Vasopneumatic Device    PT Next Visit Plan Assess response to HEP. Consider manual therapy and dry  needling for pt's neck. Provide pt with stretches in standing. Assess 5x STS. Continue gentle strengthening for quads, hips, and core.    PT Home Exercise Plan Access Code [email or web address]    Recommended Other Services Aquatic therapy    Consulted and Agree with Plan of Care Patient           Patient will benefit from skilled therapeutic intervention in order to improve the following deficits and impairments:  Decreased endurance,Hypomobility,Decreased activity tolerance,Decreased strength,Increased fascial restricitons,Pain,Decreased balance,Decreased mobility,Difficulty walking,Decreased range of motion,Impaired flexibility,Postural dysfunction  Visit Diagnosis: Fibromyalgia  Muscle weakness (generalized)  Abnormal posture  Pain in both lower extremities     Problem List Patient Active Problem List   Diagnosis Date Noted  . OSA (obstructive sleep apnea) 05/19/2020  . 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/17/2020  . Sleep disturbance 04/17/2020  . Chronic pain of both shoulders 03/03/2020  . Pain in both hands 03/03/2020  . Primary osteoarthritis of both knees 03/03/2020  . Pain in both feet 03/03/2020  . History of chronic kidney disease 03/03/2020  . DDD (degenerative disc disease), lumbar 02/05/2020  .  Vitamin D deficiency 01/09/2020  . Hyperlipidemia 01/09/2020  . CKD (chronic kidney disease) stage 3, GFR 30-59 ml/min (HCC) 01/09/2020  . HTN (hypertension)   . Morbid obesity ([place])   . Gout   . Fibromyalgia   . Depression   . Insomnia     Marvena Tally April Ma L Mourad Cwikla PT, DPT 05/30/2020, 11:33 AM  [facility] [place], [place], [address] Phone: [phone]   Fax:  [phone]  Name: Amy Hooper MRN: [id number] Date of Birth: 04/18/1950

## 2020-05-30 NOTE — Patient Instructions (Signed)
Access Code: [email or web address] URL: [email or web address] Date: 05/30/2020 Prepared by: Estill Bamberg April Thurnell Garbe  Exercises Seated Posterior Pelvic Tilt - 1 x daily - 7 x weekly - 1 sets - 10 reps Seated Hip Abduction with Resistance - 1 x daily - 7 x weekly - 2 sets - 10 reps Seated March - 1 x daily - 7 x weekly - 2 sets - 10 reps Seated Figure 4 Piriformis Stretch - 1 x daily - 7 x weekly - 2 sets - 20-30 sec hold Seated Hamstring Stretch - 1 x daily - 7 x weekly - 2 sets - 20-30 sec hold

## 2020-05-31 ENCOUNTER — Other Ambulatory Visit: Payer: No Typology Code available for payment source

## 2020-06-02 ENCOUNTER — Ambulatory Visit: Payer: No Typology Code available for payment source

## 2020-06-02 ENCOUNTER — Other Ambulatory Visit: Payer: Self-pay

## 2020-06-02 ENCOUNTER — Ambulatory Visit (INDEPENDENT_AMBULATORY_CARE_PROVIDER_SITE_OTHER): Payer: No Typology Code available for payment source | Admitting: Podiatry

## 2020-06-02 ENCOUNTER — Ambulatory Visit (INDEPENDENT_AMBULATORY_CARE_PROVIDER_SITE_OTHER): Payer: No Typology Code available for payment source

## 2020-06-02 ENCOUNTER — Encounter: Payer: Self-pay | Admitting: Podiatry

## 2020-06-02 DIAGNOSIS — M79671 Pain in right foot: Secondary | ICD-10-CM

## 2020-06-02 DIAGNOSIS — M25572 Pain in left ankle and joints of left foot: Secondary | ICD-10-CM

## 2020-06-02 DIAGNOSIS — M779 Enthesopathy, unspecified: Secondary | ICD-10-CM | POA: Diagnosis not present

## 2020-06-02 DIAGNOSIS — M79672 Pain in left foot: Secondary | ICD-10-CM | POA: Diagnosis not present

## 2020-06-02 MED ORDER — TRIAMCINOLONE ACETONIDE 10 MG/ML IJ SUSP
10.0000 mg | Freq: Once | INTRAMUSCULAR | Status: AC
  Administered 2020-06-02: 10 mg

## 2020-06-03 NOTE — Telephone Encounter (Signed)
I called patient and scheduled surgery for 08/25/20.

## 2020-06-04 ENCOUNTER — Ambulatory Visit (HOSPITAL_BASED_OUTPATIENT_CLINIC_OR_DEPARTMENT_OTHER): Payer: No Typology Code available for payment source | Admitting: Physical Therapy

## 2020-06-04 ENCOUNTER — Encounter: Payer: Self-pay | Admitting: Physical Therapy

## 2020-06-04 ENCOUNTER — Other Ambulatory Visit: Payer: Self-pay

## 2020-06-04 ENCOUNTER — Ambulatory Visit: Payer: PRIVATE HEALTH INSURANCE | Attending: Physical Medicine & Rehabilitation | Admitting: Physical Therapy

## 2020-06-04 DIAGNOSIS — M79604 Pain in right leg: Secondary | ICD-10-CM | POA: Insufficient documentation

## 2020-06-04 DIAGNOSIS — M79605 Pain in left leg: Secondary | ICD-10-CM | POA: Diagnosis present

## 2020-06-04 DIAGNOSIS — R293 Abnormal posture: Secondary | ICD-10-CM | POA: Diagnosis present

## 2020-06-04 DIAGNOSIS — M25511 Pain in right shoulder: Secondary | ICD-10-CM | POA: Insufficient documentation

## 2020-06-04 DIAGNOSIS — M6281 Muscle weakness (generalized): Secondary | ICD-10-CM | POA: Insufficient documentation

## 2020-06-04 DIAGNOSIS — G8929 Other chronic pain: Secondary | ICD-10-CM | POA: Insufficient documentation

## 2020-06-04 DIAGNOSIS — R600 Localized edema: Secondary | ICD-10-CM | POA: Insufficient documentation

## 2020-06-04 DIAGNOSIS — M797 Fibromyalgia: Secondary | ICD-10-CM | POA: Diagnosis present

## 2020-06-04 NOTE — Therapy (Signed)
[place], [place], [address] Phone: [phone]   Fax:  [phone]  Physical Therapy Treatment  Patient Details  Name: Amy Hooper MRN: [id number] Date of Birth: 23-Nov-1950 Referring Provider (PT): Jamse Arn, MD   Encounter Date: 06/04/2020   PT End of Session - 06/04/20 1655    Visit Number 2    Number of Visits 17    Date for PT Re-Evaluation 07/25/20    Authorization Type PHCS multiplan    PT Start Time 1430    PT Stop Time 1500    PT Time Calculation (min) 30 min    Activity Tolerance Patient tolerated treatment well    Behavior During Therapy [facility] for tasks assessed/performed           Past Medical History:  Diagnosis Date  . Chronic kidney disease   . Depression   . Fibromyalgia   . Gout   . HTN (hypertension)   . Insomnia   . Morbid obesity ([place])   . Osteoarthritis   . Palpitation     Past Surgical History:  Procedure Laterality Date  . KNEE ARTHROSCOPY    . right rotator cuff    . ROTATOR CUFF REPAIR Left   . TUBAL LIGATION      There were no vitals filed for this visit.   Subjective Assessment - 06/04/20 1327    Subjective I am sorry I was late   I was driving from [place] and It started to rain.  I have pain from fibromyalgia with back 8/10 and and my knees sometimes 6 - 8/10    Pertinent History Fibromyalgia, OA, DDD, gets regular steroid shots in L knee and R hip    Patient Stated Goals Increase movement, decrease pain    Currently in Pain? Yes    Pain Score 8     Pain Location Generalized    Pain Descriptors / Indicators Aching    Pain Type Chronic pain           Aquatic therapy at [address] - therapeutic pool temp 88  degrees Pt enters building without AD.  Treatment took place in water 3.0 to 4.8.feet deep depending upon activity.  Pt entered and exited the pool via stair and handrails with supervision   Pt arrived late  to pool but was able to complete 30 minutes on initial visit.  Initially, Ms Wenberg was educated on properties of water and importance of the buoyancy and hydrostatic pressure challenging his balance and strength while static and dynamic postures. Which also benefit his spinal/postural control musculature,  Pt was made aware of neutral posture and strategies for maintaining in water using points on posterior spine on pool wall and engaging abdominal muscles while resisting flotation weights/ noodle under water.  Practiced sitting and hip hinging with water submerged to mid chest with no pain x 15 Then using wall as external cue ,hip hinging in standing x 15  Pt then performed gastroc stretch against pool wall 30 sec x 3 on RT/LT.  For tightened hip flexors , pt performed dynamic running stretch on water submerged steps 10 x each side holding for 30  sec each  Deep squats on edge of pool x 20 maintaining upright / neutral posture with VC/TC   On edge of pool with bil UE support  Pt performed LE exercise  Hip abd/add R/L 10 x each and then using 1 UE support Hip ext/flex with knee  straight x 20,   Marching knee/hip 90/90 x 20   then ham curl R/L x 20 Hip ext/flex 10 x each R/L  Pt performed ambulate forwards 15 x 6  and continued 15 ft x 4 side stepping to the right and to the Left each.  Pt able to ambulate 15x 6 backwards and utilizing RPE to increase exertion with exercise to gain cardio benefits for fibromyalgia.  Pt received Aqua Stretch technique for quad release in water with palpation and hold over rectus femoris, intermedius and pes anserina on bil knees with decrease pain after RX   Pt requires the buoyancy of water for active assisted exercises with buoyancy supported for strengthening & ROM exercises: pt requires the viscosity of the water for resistance with strengthening exercises Water allow for ambulating quickly without fear of falling in water and increasing stride length Water  will allow for reduced gait deviation dur to reduced joint loading through buoyancy to help patient improve posture without excess stress and pain.  Water current provides perturbations which challenge standing balance unsupported Pt is unable to tolerate land due to pain or inability to move freely on land without pain and substitution/compensations                            PT Short Term Goals - 05/30/20 1126      PT SHORT TERM GOAL #1   Title Pt will be independent with initial HEP    Time 4    Period Weeks    Status New    Target Date 06/27/20      PT SHORT TERM GOAL #2   Title Pt will be able to perform 5x STS without using UEs in <20 sec    Baseline Currently needs use of UEs to come to full standing    Time 4    Period Weeks    Status New    Target Date 06/27/20      PT SHORT TERM GOAL #3   Title Pt will be able to stand on R LE for at least 8 sec to demo improved stability    Baseline 4 sec on R    Time 4    Period Weeks    Status New    Target Date 06/27/20             PT Long Term Goals - 05/30/20 1128      PT LONG TERM GOAL #1   Title Pt will report decrease in general pain by 50%    Time 8    Period Weeks    Status New    Target Date 07/25/20      PT LONG TERM GOAL #2   Title Pt will be able to self manage symptoms with exercises and stretches independently    Time 8    Period Weeks    Status New    Target Date 07/25/20      PT LONG TERM GOAL #3   Title Pt will demo 5x STS <13 sec for improved functional strength    Time 8    Period Weeks    Status New    Target Date 07/25/20           Aquatic      Plan - 06/04/20 1747    Clinical Impression Statement Ms Krausz enters Pool area 15 min late due to driving in the rain from [place] but was able to  be educated on the properties of water and therapeutic benefits.  Ms Huffstetler was also educated on RPE and benefits of cardio for fibromyalgia.  Pt was able to perform  exercises and walking in the water with reported 0/10 pain. Pt is unable to tolerate land due to pain or inability to move freely on land without pain and substitution/compensations.  Pt also tolerated manual therapy using aquastretch for bil Quadriceps muscles with reporting 0/10 pain in bil knees upon exiting water.  Pt will benefit from additional aquatic visits to formulate an HEP to utilize for pool therapy post DC from land PT    Personal Factors and Comorbidities Age;Comorbidity 1;Comorbidity 3+;Comorbidity 2;Past/Current Experience;Profession;Time since onset of injury/illness/exacerbation    Comorbidities Fibromyalgia, DDD, OA    Examination-Activity Limitations Sit;Stand;Transfers;Locomotion Level;Sleep    Examination-Participation Restrictions Community Activity;Driving;Occupation    PT Frequency 2x / week    PT Duration 8 weeks    PT Treatment/Interventions Aquatic Therapy;ADLs/Self Care Home Management;Electrical Stimulation;Traction;Moist Heat;Iontophoresis 4mg /ml Dexamethasone;Gait training;Stair training;Functional mobility training;Therapeutic activities;Therapeutic exercise;Balance training;Neuromuscular re-education;Patient/family education;Manual techniques;Passive range of motion;Dry needling;Taping;Vasopneumatic Device    PT Next Visit Plan Assess response to HEP. Consider manual therapy and dry needling for pt's neck. Provide pt with stretches in standing. Assess 5x STS. Continue gentle strengthening for quads, hips, and core.    PT Home Exercise Plan Access Code [email or web address]    Consulted and Agree with Plan of Care Patient           Patient will benefit from skilled therapeutic intervention in order to improve the following deficits and impairments:  Decreased endurance,Hypomobility,Decreased activity tolerance,Decreased strength,Increased fascial restricitons,Pain,Decreased balance,Decreased mobility,Difficulty walking,Decreased range of motion,Impaired flexibility,Postural  dysfunction  Visit Diagnosis: Fibromyalgia  Muscle weakness (generalized)  Abnormal posture  Pain in both lower extremities  Chronic right shoulder pain  Localized edema     Problem List Patient Active Problem List   Diagnosis Date Noted  . OSA (obstructive sleep apnea) 05/19/2020  . 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/17/2020  . Sleep disturbance 04/17/2020  . Chronic pain of both shoulders 03/03/2020  . Pain in both hands 03/03/2020  . Primary osteoarthritis of both knees 03/03/2020  . Pain in both feet 03/03/2020  . History of chronic kidney disease 03/03/2020  . DDD (degenerative disc disease), lumbar 02/05/2020  . Vitamin D deficiency 01/09/2020  . Hyperlipidemia 01/09/2020  . CKD (chronic kidney disease) stage 3, GFR 30-59 ml/min (HCC) 01/09/2020  . HTN (hypertension)   . Morbid obesity ([place])   . Gout   . Fibromyalgia   . Depression   . Insomnia    [name], PT, [facility] Certified Exercise Expert for the Aging Adult  06/04/20 5:52 PM Phone: [phone] Fax: [facility] [address] [place], [place], [address] Phone: [phone]   Fax:  [phone]  Name: Avice Funchess MRN: [id number] Date of Birth: Mar 30, 1950

## 2020-06-05 NOTE — Progress Notes (Signed)
Subjective:   Patient ID: Amy Hooper, female   DOB: 70 y.o.   MRN: [id number]   HPI Patient presents with a lot of pain in the right ankle and states it is been present for several months and she does not remember injury.  States it gets more tender as she walks and she needs to be active with work and patient states that she has tried to reduce her activity and wear shoe gear to support the arch.  Patient does not smoke likes to be active if possible   Review of Systems  All other systems reviewed and are negative.       Objective:  Physical Exam Vitals and nursing note reviewed.  Constitutional:      Appearance: She is well-developed.  Pulmonary:     Effort: Pulmonary effort is normal.  Musculoskeletal:        General: Normal range of motion.  Skin:    General: Skin is warm.  Neurological:     Mental Status: She is alert.     Neurovascular status was found to be intact muscle strength was found to be adequate range of motion is found to be adequate.  Patient is found to have exquisite discomfort in the right sinus tarsi with inflammation fluid of the joint and moderate flatfoot deformity leading to increased stress against the area.  She is splinting and I was not able to properly invert and evert her foot due to the discomfort she is experiencing did have good digital perfusion     Assessment:  Probability for acute sinus tarsitis right with inflammation of the capsule with possibility that she may have some other underlying arthritis or injury     Plan:  H&P all conditions reviewed.  Today sterile prep done injected the sinus tarsi right 3 mg Kenalog 5 mg Xylocaine advised on support therapy anti-inflammatories physical therapy and reappoint for [place] to recheck his symptoms indicate and may require immobilization  X-rays indicate that there is no signs of coalition or arthritis of the subtalar joint with moderate arthritis of the foot and mild flatfoot deformity

## 2020-06-11 ENCOUNTER — Ambulatory Visit: Payer: PRIVATE HEALTH INSURANCE | Admitting: Physical Therapy

## 2020-06-11 ENCOUNTER — Ambulatory Visit: Payer: No Typology Code available for payment source | Admitting: Physical Therapy

## 2020-06-11 ENCOUNTER — Other Ambulatory Visit: Payer: Self-pay

## 2020-06-11 DIAGNOSIS — M6281 Muscle weakness (generalized): Secondary | ICD-10-CM

## 2020-06-11 DIAGNOSIS — M797 Fibromyalgia: Secondary | ICD-10-CM | POA: Diagnosis not present

## 2020-06-11 DIAGNOSIS — R293 Abnormal posture: Secondary | ICD-10-CM

## 2020-06-11 DIAGNOSIS — M79604 Pain in right leg: Secondary | ICD-10-CM

## 2020-06-11 DIAGNOSIS — M79605 Pain in left leg: Secondary | ICD-10-CM

## 2020-06-11 NOTE — Therapy (Signed)
[place], [place], [address] Phone: [phone]   Fax:  [phone]  Physical Therapy Treatment  Patient Details  Name: Amy Hooper MRN: [id number] Date of Birth: [birth year] Referring Provider (PT): Jamse Arn, MD   Encounter Date: 06/11/2020   PT End of Session - 06/11/20 1522    Visit Number 3    Number of Visits 17    Date for PT Re-Evaluation 07/25/20    Authorization Type PHCS multiplan    PT Start Time 1415    PT Stop Time 1500    PT Time Calculation (min) 45 min    Activity Tolerance Patient tolerated treatment well    Behavior During Therapy [facility] for tasks assessed/performed           Past Medical History:  Diagnosis Date  . Chronic kidney disease   . Depression   . Fibromyalgia   . Gout   . HTN (hypertension)   . Insomnia   . Morbid obesity ([place])   . Osteoarthritis   . Palpitation     Past Surgical History:  Procedure Laterality Date  . KNEE ARTHROSCOPY    . right rotator cuff    . ROTATOR CUFF REPAIR Left   . TUBAL LIGATION      There were no vitals filed for this visit.   Subjective Assessment - 06/11/20 1513    Subjective I am driving from [place] and I used my TENS because my back hurt so bad.  I am a 8/10  but I was looking forward to the water. and my knee is 7/10.  After Aquatics  my pain is so much better about a 3/10    Pertinent History Fibromyalgia, OA, DDD, gets regular steroid shots in L knee and R hip    Currently in Pain? Yes    Pain Score 8     Pain Location Generalized    Pain Descriptors / Indicators Aching    Pain Type Chronic pain             Aquatic therapy at [address] - therapeutic pool temp 88 degrees Pt enters building without AD.Treatment took place in water 3.0 to 4.8.feet deep depending upon activity. Pt entered and exited the pool via stair and handrails with supervision.  Pt had driven in car for  one hour and using TENS unit for 8/10 pain.  Pt was made aware of neutral posture and strategies for maintaining in water using points on posterior spine on pool wall and engaging abdominal muscles while resisting flotation weights/ noodle under water.  Practiced sitting and hip hinging with water submerged to mid chest with no pain x 10 Then using wall as external cue ,hip hinging in standing x 10 Cat camel with pool noodle Amy Hooper  performed dynamic running stretch on water submerged steps 2 x each side holding for 30  sec each ( knee flex, hip flexor and hamstring) Sitting figure 4 stretch on R and L 2 x 30 sec  Deep squats on edge of pool x 20 maintaining upright / neutral posture with VC/TC   On edge of pool with bil UE support  Pt performed LE exercise Hip abd/add R/L 10 x each and then using 1 UE support Hip ext/flex with knee straight x 20,  Marching knee/hip 90/90 x 20  Hip ext/flex 10 x each R/L Lunge to target x 10  Pt performed ambulate( with aquatic cuffs around ankles and  aquatic DB forwards 15 x 4  and continued 15 ft x 2 side stepping to the right and to the Left each.  Plie steps 15 ft x 2 Pt able to ambulate 15x 4 backwards and utilizing RPE to increase exertion with exercise to gain cardio benefits for fibromyalgia.  Bad Ragaz, Pt with lumbar belt around hips and nek doodle for neck support.  .  Pt assisted into supine floating position by lying head on shoulder of PT to get into floating position. PT at torso and assisting with trunk left to right and vice versa to engage trunk muscles. PT then rotated trunk in order to engage abdomnal (internal and external obliques)  Emphasis on breathing techniques to draw in abdominals for support.  Pt then utilizing posterior chain and engaging Hip extension and knee flexion with water resistance.  Pt received Aqua Stretch technique for quad release in water with palpation and hold over rectus  pes anserina on Left knee with  decrease pain after RX. kn supine   Pt requires the buoyancy of water for active assisted exercises with buoyancy supported for strengthening& ROM exercises: ptrequires the viscosity of the water forresistance withstrengtheningexercises Water allow for ambulating quickly without fear of falling in water and increasing stride length Water will allow for reduced gait deviation due to reduced joint loading through buoyancy to help patient improve posture without excess stress and pain.  Water current provides perturbations which challenge standing balance unsupported Pt is unable to tolerate land due to pain or inability to move freely on land without pain and substitution/compensations in gait.                        PT Education - 06/11/20 1524    Education Details Introduced part of HEP and reinforced in the water.  communiity wellness opportuniites for continuing post DC from PT for [employer]) Educated Patient    Methods Explanation;Demonstration;Tactile cues;Verbal cues;Handout    Comprehension Verbalized understanding;Returned demonstration            PT Short Term Goals - 05/30/20 1126      PT SHORT TERM GOAL #1   Title Pt will be independent with initial HEP    Time 4    Period Weeks    Status New    Target Date 06/27/20      PT SHORT TERM GOAL #2   Title Pt will be able to perform 5x STS without using UEs in <20 sec    Baseline Currently needs use of UEs to come to full standing    Time 4    Period Weeks    Status New    Target Date 06/27/20      PT SHORT TERM GOAL #3   Title Pt will be able to stand on R LE for at least 8 sec to demo improved stability    Baseline 4 sec on R    Time 4    Period Weeks    Status New    Target Date 06/27/20             PT Long Term Goals - 05/30/20 1128      PT LONG TERM GOAL #1   Title Pt will report decrease in general pain by 50%    Time 8    Period Weeks    Status New    Target  Date 07/25/20      PT LONG TERM GOAL #  2   Title Pt will be able to self manage symptoms with exercises and stretches independently    Time 8    Period Weeks    Status New    Target Date 07/25/20      PT LONG TERM GOAL #3   Title Pt will demo 5x STS <13 sec for improved functional strength    Time 8    Period Weeks    Status New    Target Date 07/25/20                 Plan - 06/11/20 1520    Clinical Impression Statement Amy Hooper enters pool area with complaint of 8/10 pain and had utilized a TENS unit while driving an hour from [place] for Aquatic Therapy.  Pt able to participate and HEP introduced to pt. today from [place].  Pt was able to reduce pain to 3/10 post RX.   Amy Hooper able to use buoyancy of water to unweight painful LE and back joints in order to enjoy greater AROM and decrease pain.  Pt also able to enjoy challenge of water resistance and hydrostatic pressure to increase resistance and  promote muscle strength.  Pt will attend next week to help finalize a program fro aquatics pt can use post DC from therapy.  Will continue to challenge at pt is able to tolerate.    Personal Factors and Comorbidities Age;Comorbidity 1;Comorbidity 3+;Comorbidity 2;Past/Current Experience;Profession;Time since onset of injury/illness/exacerbation    Comorbidities Fibromyalgia, DDD, OA    Examination-Activity Limitations Sit;Stand;Transfers;Locomotion Level;Sleep    Examination-Participation Restrictions Community Activity;Driving;Occupation    PT Treatment/Interventions Aquatic Therapy;ADLs/Self Care Home Management;Electrical Stimulation;Traction;Moist Heat;Iontophoresis 4mg /ml Dexamethasone;Gait training;Stair training;Functional mobility training;Therapeutic activities;Therapeutic exercise;Balance training;Neuromuscular re-education;Patient/family education;Manual techniques;Passive range of motion;Dry needling;Taping;Vasopneumatic Device    PT Next Visit Plan Assess response to HEP.  Consider manual therapy and dry needling for pt's neck. Provide pt with stretches in standing. Assess 5x STS. Continue gentle strengthening for quads, hips, and core.    PT Home Exercise Plan Access Code [email or web address]  aquatics [email or web address]    Consulted and Agree with Plan of Care Patient           Patient will benefit from skilled therapeutic intervention in order to improve the following deficits and impairments:  Decreased endurance,Hypomobility,Decreased activity tolerance,Decreased strength,Increased fascial restricitons,Pain,Decreased balance,Decreased mobility,Difficulty walking,Decreased range of motion,Impaired flexibility,Postural dysfunction  Visit Diagnosis: Fibromyalgia  Muscle weakness (generalized)  Abnormal posture  Pain in both lower extremities  Access Code: [email or web address] URL: [email or web address] Date: 06/10/2020 Prepared by: [name]  Exercises . Lunge to Target at [address] - 1 x daily - 7 x weekly - 3 sets - 10 reps . Forward Walking Lunge in [employer] - 1 x daily - 7 x weekly - 3 sets - 10 reps . Backward Walking Lunge in [employer] - 1 x daily - 7 x weekly - 3 sets - 10 reps . Plie Walk in [place] - 1 x daily - 7 x weekly - 3 sets - 10 reps . Forward Jog in [place] - 1 x daily - 7 x weekly - 3 sets - 10 reps . Backward Jog in [place] - 1 x daily - 7 x weekly - 3 sets - 10 reps . Tuck Jumps - 1 x daily - 7 x weekly - 3 sets - 10 reps    Problem List Patient Active Problem List   Diagnosis Date Noted  . OSA (obstructive sleep apnea) 05/19/2020  .  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/17/2020  . Sleep disturbance 04/17/2020  . Chronic pain of both shoulders 03/03/2020  . Pain in both hands 03/03/2020  . Primary osteoarthritis of both knees 03/03/2020  . Pain in both feet 03/03/2020  . History of chronic kidney disease 03/03/2020  . DDD (degenerative disc disease), lumbar 02/05/2020  . Vitamin D  deficiency 01/09/2020  . Hyperlipidemia 01/09/2020  . CKD (chronic kidney disease) stage 3, GFR 30-59 ml/min (HCC) 01/09/2020  . HTN (hypertension)   . Morbid obesity ([place])   . Gout   . Fibromyalgia   . Depression   . Insomnia    [name], PT, [facility] Certified Exercise Expert for the Aging Adult  06/11/20 3:42 PM Phone: [phone] Fax: [facility] [address] [place], [place], [address] Phone: [phone]   Fax:  [phone]  Name: Amy Hooper MRN: [id number] Date of Birth: 1950/11/08

## 2020-06-17 ENCOUNTER — Encounter: Payer: Self-pay | Admitting: Orthopaedic Surgery

## 2020-06-17 ENCOUNTER — Ambulatory Visit (INDEPENDENT_AMBULATORY_CARE_PROVIDER_SITE_OTHER): Payer: No Typology Code available for payment source | Admitting: Orthopaedic Surgery

## 2020-06-17 DIAGNOSIS — M1712 Unilateral primary osteoarthritis, left knee: Secondary | ICD-10-CM

## 2020-06-17 MED ORDER — METHYLPREDNISOLONE ACETATE 40 MG/ML IJ SUSP
40.0000 mg | INTRAMUSCULAR | Status: AC | PRN
  Administered 2020-06-17: 40 mg via INTRA_ARTICULAR

## 2020-06-17 MED ORDER — LIDOCAINE HCL 1 % IJ SOLN
2.0000 mL | INTRAMUSCULAR | Status: AC | PRN
  Administered 2020-06-17: 2 mL

## 2020-06-17 MED ORDER — BUPIVACAINE HCL 0.5 % IJ SOLN
2.0000 mL | INTRAMUSCULAR | Status: AC | PRN
Start: 2020-06-17 — End: 2020-06-17
  Administered 2020-06-17: 2 mL via INTRA_ARTICULAR

## 2020-06-17 NOTE — Progress Notes (Signed)
Office Visit Note   [name]           Date of Birth: 1950/04/30           MRN: [id number] Visit Date: 06/17/2020              Requested by: Isaac Bliss, [name], MD [place],  [place] [id number] PCP: Isaac Bliss, [name], MD   Assessment & Plan: Visit Diagnoses:  1. Primary osteoarthritis of left knee     Plan: Impression is end-stage left knee DJD.  Cortisone injection repeated today.  This will be the last 1 before total knee replacement in June.  Follow-Up Instructions: No follow-ups on file.   Orders:  No orders of the defined types were placed in this encounter.  No orders of the defined types were placed in this encounter.     Procedures: Large Joint Inj: L knee on 06/17/2020 8:15 AM Details: 22 G needle Medications: 2 mL bupivacaine 0.5 %; 2 mL lidocaine 1 %; 40 mg methylPREDNISolone acetate 40 MG/ML Outcome: tolerated well, no immediate complications Patient was prepped and draped in the usual sterile fashion.       Clinical Data: No additional findings.   Subjective: Chief Complaint  Patient presents with  . Left Knee - Pain    Patient comes in today for left knee pain she would like another denies any changes otherwise.   Review of Systems   Objective: Vital Signs: There were no vitals taken for this visit.  Physical Exam  Ortho Exam Left knee exam is unchanged. Specialty Comments:  No specialty comments available.  Imaging: No results found.   PMFS History: Patient Active Problem List   Diagnosis Date Noted  . OSA (obstructive sleep apnea) 05/19/2020  . 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/17/2020  . Sleep disturbance 04/17/2020  . Chronic pain of both shoulders 03/03/2020  . Pain in both hands 03/03/2020  . Primary osteoarthritis of both knees 03/03/2020  . Pain in both feet 03/03/2020  . History of chronic kidney disease 03/03/2020  . DDD (degenerative disc  disease), lumbar 02/05/2020  . Vitamin D deficiency 01/09/2020  . Hyperlipidemia 01/09/2020  . CKD (chronic kidney disease) stage 3, GFR 30-59 ml/min (HCC) 01/09/2020  . HTN (hypertension)   . Morbid obesity ([place])   . Gout   . Fibromyalgia   . Depression   . Insomnia    Past Medical History:  Diagnosis Date  . Chronic kidney disease   . Depression   . Fibromyalgia   . Gout   . HTN (hypertension)   . Insomnia   . Morbid obesity ([place])   . Osteoarthritis   . Palpitation     Family History  Problem Relation Age of Onset  . Hypothyroidism Mother   . Hyperlipidemia Other   . Kidney failure Father   . Dementia Father   . Gout Father   . Arthritis Father   . Prostate cancer Father     Past Surgical History:  Procedure Laterality Date  . KNEE ARTHROSCOPY    . right rotator cuff    . ROTATOR CUFF REPAIR Left   . TUBAL LIGATION     Social History   Occupational History  . 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 Smoking status: Never Smoker  . Smokeless tobacco: Never Used  Vaping Use  . Vaping Use: Never used  Substance and Sexual Activity  . Alcohol use: Never    Alcohol/week:  0.0 standard drinks  . Drug use: Never  . Sexual activity: Never

## 2020-06-18 ENCOUNTER — Ambulatory Visit: Payer: PRIVATE HEALTH INSURANCE | Admitting: Physical Therapy

## 2020-06-18 ENCOUNTER — Other Ambulatory Visit: Payer: Self-pay | Admitting: Podiatry

## 2020-06-18 ENCOUNTER — Other Ambulatory Visit: Payer: Self-pay

## 2020-06-18 ENCOUNTER — Encounter: Payer: Self-pay | Admitting: Physical Therapy

## 2020-06-18 ENCOUNTER — Ambulatory Visit: Payer: No Typology Code available for payment source | Admitting: Physical Therapy

## 2020-06-18 DIAGNOSIS — M79604 Pain in right leg: Secondary | ICD-10-CM

## 2020-06-18 DIAGNOSIS — M25572 Pain in left ankle and joints of left foot: Secondary | ICD-10-CM

## 2020-06-18 DIAGNOSIS — M797 Fibromyalgia: Secondary | ICD-10-CM | POA: Diagnosis not present

## 2020-06-18 DIAGNOSIS — M25571 Pain in right ankle and joints of right foot: Secondary | ICD-10-CM

## 2020-06-18 DIAGNOSIS — R293 Abnormal posture: Secondary | ICD-10-CM

## 2020-06-18 DIAGNOSIS — M79605 Pain in left leg: Secondary | ICD-10-CM

## 2020-06-18 DIAGNOSIS — M6281 Muscle weakness (generalized): Secondary | ICD-10-CM

## 2020-06-18 NOTE — Therapy (Addendum)
[place], [place], [address] Phone: [phone]   Fax:  [phone]  Physical Therapy Treatment  Patient Details  Name: Amy Hooper MRN: [id number] Date of Birth: 06-10-1950 Referring Provider (PT): Jamse Arn, MD   Encounter Date: 06/18/2020   PT End of Session - 06/18/20 1657    Visit Number 4    Number of Visits 17    Date for PT Re-Evaluation 07/25/20    Authorization Type PHCS multiplan    PT Start Time 1415    PT Stop Time 1500    PT Time Calculation (min) 45 min    Activity Tolerance Patient tolerated treatment well    Behavior During Therapy [facility] for tasks assessed/performed           Past Medical History:  Diagnosis Date  . Chronic kidney disease   . Depression   . Fibromyalgia   . Gout   . HTN (hypertension)   . Insomnia   . Morbid obesity ([place])   . Osteoarthritis   . Palpitation     Past Surgical History:  Procedure Laterality Date  . KNEE ARTHROSCOPY    . right rotator cuff    . ROTATOR CUFF REPAIR Left   . TUBAL LIGATION      There were no vitals filed for this visit.   Subjective Assessment - 06/18/20 1654    Subjective Pt was driving from [place]  She reports she has a 6/10 pain.  she has had an injection in her knee yesterday and feelling better but thought that she did too much and caused her pain.  she is planning on TKA in Left knee in June  and is preparing for her surgery as well as managing her Fibromyalgia    Pertinent History Fibromyalgia, OA, DDD, gets regular steroid shots in L knee and R hip    Currently in Pain? Yes    Pain Score 6     Pain Location Generalized    Pain Orientation Right;Left    Pain Descriptors / Indicators Aching              Aquatic therapy at [address] - therapeutic pool temp 89  degrees Pt enters building without AD.  Treatment took place in water 3.0 to 4.8.feet deep depending upon  activity.  Pt entered and exited the pool via stair and handrails with supervision.  This is second visit at aquatic therapy   Pt began session walking in water 15 ft x 6   Ms Colvard practiced  hip hinging with water submerged to mid chest with no pain x 10 Then using wall as external cue ,hip hinging in standing x 10 Cat camel with pool noodle PT discussed alternat hamstring stretch using pool noodle with back against wall and noodle under ankle R in single limb balance on L for 1 min followed by L LE pool noodle under ankle for 1 min Sitting figure 4 stretch on R and L 2 x 30 sec  Deep squats on edge of pool x 20 maintaining upright / neutral posture with VC/TC On stepping stool submerged. Pt able to perform 10 x stepups to step downs on R and L, then 10 x each curtsy lunge both R and L    On edge of pool with bil UE support  Pt performed LE exercise Hip abd/add R/L 15 x each and then using 1 UE support Hip ext/flex with knee straight x 15,  Marching knee/hip 90/90 x 20  Waist circles to use drag of water to challenge balance and core mx. Trunk rotation with kick board submerged for added abdominal engagement using drag of water Tuck jumps forward attempted x 5,  Changed to treading on water for 1 minutes to increase RPE to 6/7 level     Pt performed ambulate( with aquatic cuffs around ankles and aquatic DB forwards 15 x 4  and continued 15 ft x 2 side stepping to the right and to the Left each.   Pt able to jog 15x 4 backwards and utilizing RPE to increase exertion with exercise to gain cardio benefits for fibromyalgia.  Bad Ragaz, Pt with lumbar belt around hips and nek doodle for neck support.  .  Pt assisted into supine floating position by lying head on shoulder of PT to get into floating position. PT at torso and assisting with trunk left to right and vice versa to engage trunk muscles. PT then rotated trunk in order to engage abdomnal (internal and external obliques)  Emphasis on  breathing techniques to draw in abdominals for support.  Pt then utilizing posterior chain and engaging Hip extension and knee flexion with water resistance.  PROM of knee quads to max knee flexion in water     Pt requires the buoyancy of water for active assisted exercises with buoyancy supported for strengthening & ROM exercises: pt requires the viscosity of the water for resistance with strengthening exercises Water allow for ambulating quickly without fear of falling in water and increasing stride length Water will allow for reduced gait deviation due to reduced joint loading through buoyancy to help patient improve posture without excess stress and pain.  Water current provides perturbations which challenge standing balance unsupported Pt is unable to tolerate land due to pain or inability to move freely on land without pain and substitution/compensations in gait.                         PT Education - 06/18/20 1656    Education Details reinforcing Aquatic principles and formulating a good HEP for her use independently    Person(s) Educated Patient    Methods Explanation;Demonstration;Tactile cues;Verbal cues    Comprehension Verbalized understanding;Returned demonstration            PT Short Term Goals - 05/30/20 1126      PT SHORT TERM GOAL #1   Title Pt will be independent with initial HEP    Time 4    Period Weeks    Status New    Target Date 06/27/20      PT SHORT TERM GOAL #2   Title Pt will be able to perform 5x STS without using UEs in <20 sec    Baseline Currently needs use of UEs to come to full standing    Time 4    Period Weeks    Status New    Target Date 06/27/20      PT SHORT TERM GOAL #3   Title Pt will be able to stand on R LE for at least 8 sec to demo improved stability    Baseline 4 sec on R    Time 4    Period Weeks    Status New    Target Date 06/27/20             PT Long Term Goals - 05/30/20 1128      PT LONG TERM  GOAL #1  Title Pt will report decrease in general pain by 50%    Time 8    Period Weeks    Status New    Target Date 07/25/20      PT LONG TERM GOAL #2   Title Pt will be able to self manage symptoms with exercises and stretches independently    Time 8    Period Weeks    Status New    Target Date 07/25/20      PT LONG TERM GOAL #3   Title Pt will demo 5x STS <13 sec for improved functional strength    Time 8    Period Weeks    Status New    Target Date 07/25/20                 Plan - 06/18/20 1657    Clinical Impression Statement ms Westermeyer enters pool for 2nd time.  She recieved cortisone injection in Left knee 06-17-20 and is preparting for TKA L in June 2022.  Pt begain as a 6/10 and reduced to 0/10 in water  Ms Peace comments on lack of balance on land and time spent in water with core and challenging balance with water pertubations.  .Water current provides perturbations which challenge standing balance unsupported  Pt is unable to tolerate land due to pain or inability to move freely on land without pain and substitution/compensations in gait. Pt will join [place] post [facility] in prepartation for surgery and continuing strengthening. Will continue Aquatics one more visit to reinforce HEP for use in pool independently    Personal Factors and Comorbidities Age;Comorbidity 1;Comorbidity 3+;Comorbidity 2;Past/Current Experience;Profession;Time since onset of injury/illness/exacerbation    Comorbidities Fibromyalgia, DDD, OA    Examination-Activity Limitations Sit;Stand;Transfers;Locomotion Level;Sleep    Examination-Participation Restrictions Community Activity;Driving;Occupation    PT Frequency 2x / week    PT Duration 8 weeks    PT Treatment/Interventions Aquatic Therapy;ADLs/Self Care Home Management;Electrical Stimulation;Traction;Moist Heat;Iontophoresis 4mg /ml Dexamethasone;Gait training;Stair training;Functional mobility training;Therapeutic activities;Therapeutic  exercise;Balance training;Neuromuscular re-education;Patient/family education;Manual techniques;Passive range of motion;Dry needling;Taping;Vasopneumatic Device    PT Next Visit Plan Assess response to HEP. Consider manual therapy and dry needling for pt's neck. Provide pt with stretches in standing. Assess 5x STS. Continue gentle strengthening for quads, hips, and core.    PT Home Exercise Plan Access Code [email or web address]  aquatics [email or web address]    Consulted and Agree with Plan of Care Patient           Patient will benefit from skilled therapeutic intervention in order to improve the following deficits and impairments:  Decreased endurance,Hypomobility,Decreased activity tolerance,Decreased strength,Increased fascial restricitons,Pain,Decreased balance,Decreased mobility,Difficulty walking,Decreased range of motion,Impaired flexibility,Postural dysfunction  Visit Diagnosis: Fibromyalgia  Muscle weakness (generalized)  Abnormal posture  Pain in both lower extremities     Problem List Patient Active Problem List   Diagnosis Date Noted  . OSA (obstructive sleep apnea) 05/19/2020  . 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/17/2020  . Sleep disturbance 04/17/2020  . Chronic pain of both shoulders 03/03/2020  . Pain in both hands 03/03/2020  . Primary osteoarthritis of both knees 03/03/2020  . Pain in both feet 03/03/2020  . History of chronic kidney disease 03/03/2020  . DDD (degenerative disc disease), lumbar 02/05/2020  . Vitamin D deficiency 01/09/2020  . Hyperlipidemia 01/09/2020  . CKD (chronic kidney disease) stage 3, GFR 30-59 ml/min (HCC) 01/09/2020  . HTN (hypertension)   . Morbid obesity ([place])   . Gout   . Fibromyalgia   . Depression   .  Insomnia     [name], PT, [facility] Certified Exercise Expert for the Aging Adult  06/18/20 10:12 PM Phone: [phone] Fax: [place] [facility] [address] [place], [place], [address] Phone: [phone]   Fax:  [phone]  Name: Asha Grumbine MRN: [id number] Date of Birth: 01-Nov-1950

## 2020-06-19 ENCOUNTER — Encounter: Payer: Self-pay | Admitting: Orthopaedic Surgery

## 2020-06-22 ENCOUNTER — Encounter: Payer: Self-pay | Admitting: Internal Medicine

## 2020-06-22 DIAGNOSIS — M79604 Pain in right leg: Secondary | ICD-10-CM

## 2020-06-22 DIAGNOSIS — I10 Essential (primary) hypertension: Secondary | ICD-10-CM

## 2020-06-23 ENCOUNTER — Telehealth: Payer: Self-pay

## 2020-06-23 NOTE — Telephone Encounter (Signed)
Patient called she needs to r/s her appointment due to conflicting appointement call back:[phone]

## 2020-06-23 NOTE — Telephone Encounter (Signed)
Called pt and r/s 

## 2020-06-24 MED ORDER — LOSARTAN POTASSIUM 50 MG PO TABS: 1.0000 | ORAL_TABLET | Freq: Every day | ORAL | 1 refills | Status: DC

## 2020-06-24 MED ORDER — ATORVASTATIN CALCIUM 10 MG PO TABS: 10.0000 mg | ORAL_TABLET | Freq: Every day | ORAL | 1 refills | Status: DC

## 2020-06-24 MED ORDER — AMLODIPINE BESYLATE 5 MG PO TABS: 1.0000 | ORAL_TABLET | Freq: Every day | ORAL | 1 refills | Status: DC

## 2020-06-24 MED ORDER — DULOXETINE HCL 30 MG PO CPEP: ORAL_CAPSULE | ORAL | 1 refills | Status: DC

## 2020-06-25 ENCOUNTER — Ambulatory Visit: Payer: No Typology Code available for payment source | Attending: Internal Medicine | Admitting: Physical Therapy

## 2020-06-25 ENCOUNTER — Encounter: Payer: Self-pay | Admitting: Physical Therapy

## 2020-06-25 ENCOUNTER — Other Ambulatory Visit: Payer: Self-pay

## 2020-06-25 ENCOUNTER — Ambulatory Visit: Payer: No Typology Code available for payment source | Admitting: Physical Therapy

## 2020-06-25 DIAGNOSIS — M79605 Pain in left leg: Secondary | ICD-10-CM | POA: Diagnosis present

## 2020-06-25 DIAGNOSIS — R293 Abnormal posture: Secondary | ICD-10-CM | POA: Insufficient documentation

## 2020-06-25 DIAGNOSIS — M797 Fibromyalgia: Secondary | ICD-10-CM | POA: Insufficient documentation

## 2020-06-25 DIAGNOSIS — M79604 Pain in right leg: Secondary | ICD-10-CM | POA: Insufficient documentation

## 2020-06-25 DIAGNOSIS — M6281 Muscle weakness (generalized): Secondary | ICD-10-CM | POA: Insufficient documentation

## 2020-06-25 NOTE — Therapy (Signed)
[place], [place], [address] Phone: [phone]   Fax:  [phone]  Physical Therapy Treatment  Patient Details  Name: Amy Hooper MRN: [id number] Date of Birth: 1950-08-01 Referring Provider (PT): Jamse Arn, MD   Encounter Date: 06/25/2020   PT End of Session - 06/25/20 1658    Visit Number 5    Number of Visits 17    Date for PT Re-Evaluation 07/25/20    Authorization Type PHCS multiplan    PT Start Time 1416    PT Stop Time 1500    PT Time Calculation (min) 44 min    Activity Tolerance Patient tolerated treatment well    Behavior During Therapy [name] for tasks assessed/performed           Past Medical History:  Diagnosis Date  . Chronic kidney disease   . Depression   . Fibromyalgia   . Gout   . HTN (hypertension)   . Insomnia   . Morbid obesity ([place])   . Osteoarthritis   . Palpitation     Past Surgical History:  Procedure Laterality Date  . KNEE ARTHROSCOPY    . right rotator cuff    . ROTATOR CUFF REPAIR Left   . TUBAL LIGATION      There were no vitals filed for this visit.   Subjective Assessment - 06/25/20 1657    Subjective I have really enjoyed aquatics,  I may join [place] to continue.  My knees feel good today but my back in a 6/10    Pertinent History Fibromyalgia, OA, DDD, gets regular steroid shots in L knee and R hip    Currently in Pain? Yes    Pain Score 6     Pain Location Generalized   more back   Pain Orientation Right;Left    Pain Descriptors / Indicators Aching    Pain Type Chronic pain            Aquatic therapy at [address] - therapeutic pool temp 87-89  degrees Pt enters building without AD.  Treatment took place in water 3.0 to 4.8.feet deep depending upon activity.  Pt entered and exited the pool via stair and handrails with supervision.  Pt reports more back pain than knee today   [name] was given handout  for HEP for her to use for her own independent water exercises.  Pt spent time answering questions and having pt demo exercises written..  Practiced sitting and hip hinging with water submerged to mid chest with no pain x 10 Then using wall as external cue ,hip hinging in standing x 10 Utilizing pool noodle and dumb bells submerged for abdominal engagement Cat camel with pool noodle Triangle pose with pool noodle [name]  Reviewed  dynamic running stretch  Verbally but then used pool noodle for alternative hamstring stretch  Sitting figure 4 stretch on R and L 2 x 30 sec  Deep squats on edge of pool x 20 maintaining upright / neutral posture with VC/TC     On edge of pool with bil UE support  Pt performed LE exercise Hip abd/add R/L 10 x each and then using 1 UE support Hip ext/flex with knee straight x 20,  Marching knee/hip 90/90 x 20  Hip ext/flex 10 x each R/L Lunge to target x 10 Heel raise x 20 and then single limb jump ups,   Working on balance with one leg stand and performing waist  circles for perturbations Tuck jumps x 15   Pt performed ambulate( with aquatic cuffs around ankles and aquatic DB forwards 15 x 4  and continued 15 ft x 4 side stepping to the right and to the Left each.   Pt able to ambulate 15x 4 backwards and utilizing RPE to increase exertion with exercise to gain cardio benefits for fibromyalgia. Encouraged  Jogging in water as she was able,   Bad Ragaz, Pt with lumbar belt around hips and nek doodle for neck support.  .  Pt assisted into supine floating position by lying head on shoulder of PT to get into floating position. PT at torso and assisting with trunk left to right and vice versa to engage trunk muscles. PT then rotated trunk in order to engage abdomnal (internal and external obliques)  Emphasis on breathing techniques to draw in abdominals for support.  Pt then utilizing posterior chain and engaging Hip extension and knee flexion with water resistance.        Pt requires the buoyancy of water for active assisted exercises with buoyancy supported for strengthening & ROM exercises: pt requires the viscosity of the water for resistance with strengthening exercises Water allow for ambulating quickly without fear of falling in water and increasing stride length Water will allow for reduced gait deviation due to reduced joint loading through buoyancy to help patient improve posture without excess stress and pain.  Water current provides perturbations which challenge standing balance unsupported Pt is unable to tolerate land due to pain or inability to move freely on land without pain and substitution/compensations in gait.                  HEP   Access Code: [email or web address] URL: [email or web address] Date: 06/24/2020 Prepared by: [name]  Exercises . Cat Cow in Shallow Water with Pool Noodle - 1 x daily - 7 x weekly - 3 sets - 10 reps . Full Triangle Pose in [employer] with Pool Noodle - 1 x daily - 3 x weekly - 1 sets - 5 reps - 15-30 hold . Lunge to Target at [facility] - 1 x daily - 7 x weekly - 3 sets - 10 reps . Forward and Backward Walking Lunge in [employer] - 1 x daily - 7 x weekly - 3 sets - 10 reps . Forward Jog in Shallow Water - 1 x daily - 7 x weekly - 3 sets - 10 reps . Backward Jog in Shallow Water - 1 x daily - 7 x weekly - 3 sets - 10 reps . Tuck Jumps - 1 x daily - 7 x weekly - 3 sets - 10 reps         PT Education - 06/25/20 1658    Education Details Pt given writte handout for HEP and went over in the pool for return demo and comprehension    Person(s) Educated Patient    Methods Explanation;Demonstration;Tactile cues;Verbal cues;Handout    Comprehension Verbalized understanding;Returned demonstration            PT Short Term Goals - 05/30/20 1126      PT SHORT TERM GOAL #1   Title Pt will be independent with initial HEP    Time 4    Period Weeks    Status New     Target Date 06/27/20      PT SHORT TERM GOAL #2   Title Pt will be able to perform 5x STS without using UEs in <20  sec    Baseline Currently needs use of UEs to come to full standing    Time 4    Period Weeks    Status New    Target Date 06/27/20      PT SHORT TERM GOAL #3   Title Pt will be able to stand on R LE for at least 8 sec to demo improved stability    Baseline 4 sec on R    Time 4    Period Weeks    Status New    Target Date 06/27/20             PT Long Term Goals - 05/30/20 1128      PT LONG TERM GOAL #1   Title Pt will report decrease in general pain by 50%    Time 8    Period Weeks    Status New    Target Date 07/25/20      PT LONG TERM GOAL #2   Title Pt will be able to self manage symptoms with exercises and stretches independently    Time 8    Period Weeks    Status New    Target Date 07/25/20      PT LONG TERM GOAL #3   Title Pt will demo 5x STS <13 sec for improved functional strength    Time 8    Period Weeks    Status New    Target Date 07/25/20                 Plan - 06/25/20 1659    Clinical Impression Statement Ms Romanello was a joy for whom to serve.  Pt completed her return demo of HEP today in aquatic therapy and will continue on her own with independent exercise and participating in water based classes in order to prepare for TKA surgery September 08, 2020.   Pt back pain was reduced by utilizing HEP in water today.  Pt can now continue land based PT and completiion of goals.  Water current provides perturbations which challenge standing balance unsupported  Pt is unable to tolerate land due to pain or inability to move freely on land without pain and substitution/compensations in gait.    Personal Factors and Comorbidities Age;Comorbidity 1;Comorbidity 3+;Comorbidity 2;Past/Current Experience;Profession;Time since onset of injury/illness/exacerbation    Comorbidities Fibromyalgia, DDD, OA    Examination-Activity Limitations  Sit;Stand;Transfers;Locomotion Level;Sleep    Examination-Participation Restrictions Community Activity;Driving;Occupation    PT Frequency 2x / week    PT Duration 8 weeks    PT Treatment/Interventions Aquatic Therapy;ADLs/Self Care Home Management;Electrical Stimulation;Traction;Moist Heat;Iontophoresis 4mg /ml Dexamethasone;Gait training;Stair training;Functional mobility training;Therapeutic activities;Therapeutic exercise;Balance training;Neuromuscular re-education;Patient/family education;Manual techniques;Passive range of motion;Dry needling;Taping;Vasopneumatic Device    PT Next Visit Plan Assess response to HEP. Consider manual therapy and dry needling for pt's neck. Provide pt with stretches in standing. Assess 5x STS. Continue gentle strengthening for quads, hips, and core.    PT Home Exercise Plan Access Code [email or web address]  aquatics [email or web address]    Consulted and Agree with Plan of Care Patient           Patient will benefit from skilled therapeutic intervention in order to improve the following deficits and impairments:  Decreased endurance,Hypomobility,Decreased activity tolerance,Decreased strength,Increased fascial restricitons,Pain,Decreased balance,Decreased mobility,Difficulty walking,Decreased range of motion,Impaired flexibility,Postural dysfunction  Visit Diagnosis: Fibromyalgia  Muscle weakness (generalized)  Abnormal posture  Pain in both lower extremities     Problem List Patient Active Problem List   Diagnosis Date Noted  . OSA (  obstructive sleep apnea) 05/19/2020  . 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/17/2020  . Sleep disturbance 04/17/2020  . Chronic pain of both shoulders 03/03/2020  . Pain in both hands 03/03/2020  . Primary osteoarthritis of both knees 03/03/2020  . Pain in both feet 03/03/2020  . History of chronic kidney disease 03/03/2020  . DDD (degenerative disc disease), lumbar 02/05/2020  . Vitamin D deficiency 01/09/2020  .  Hyperlipidemia 01/09/2020  . CKD (chronic kidney disease) stage 3, GFR 30-59 ml/min (HCC) 01/09/2020  . HTN (hypertension)   . Morbid obesity ([place])   . Gout   . Fibromyalgia   . Depression   . Insomnia     [name], PT, [facility] Certified Exercise Expert for the Aging Adult  06/25/20 8:54 PM Phone: [phone] Fax: [place] [facility] [address] [place], [place], [address] Phone: [phone]   Fax:  [phone]  Name: Jourdan Maldonado MRN: [id number] Date of Birth: 03-04-1951

## 2020-06-25 NOTE — Patient Instructions (Signed)
    Aquatics Home Program 3 Pool Written Home Exercise All exercises you will feel a stretch but should be PAIN FREE Be aware of neutral spine/Water immersion requires continuous muscle activation with static positioning. You need to be developing muscle endurance - ability to do work over a longer period of time.  1) sitting hip hinge  Sit in waist deep  water  Bend at hips with chest up( show shirt logo) and look up/ chin down Bend forward as far as possible hold 5 sec  x15  2) standing hip hinge  Stand about a foot from wall Cross arms in front of you like holding groceries and shutting car door with buttocks  Try to touch wall with buttocks  Repeat x15   3)  Runner's stretch. use steps in pool and hold onto rail as needed, place right foot on step and bend right knee as far as you can go. This will stretch your left hip flexors as well.  Hold about 10-15 sec and repeat 5 x on Right leg. Repeat with opposite leg 4) Follow with hamstring stretch on step as shown in clinic x 5 and hold 15-30 sec  5) Use ball / floating weights/ noodle to press down in water to increase abdominal engagement while walking through the shallow water Home Program has Triangle pose and  Cat Cow with Noodle  Now, Begin with walking back and forth in water increasing speed to increase strength. Go to pool ledge and perform exercises to warm up 1) Marching in place x 20,  2) With knee straight kick leg forward and backward 20 x (Leg flex/ext) Remember to keep core quiet and engaged as shown in clinic. 3)  With knee straight kick leg across body(leading with heel) and away from body (to the side and back and return to across your body as shown in aquatic therapy x 20. Remember to keep core quiet and engaged. 4) Standing by pool ledge,(hamstrings curl) bend knee (as if you are kicking your buttock with your heel) x 20 5) Heel raises x 30 6) Squat x 20 holding onto pool ledge as deeply as possible 7) Single  limb x 3 for 30 sec each to work on balance without holding onto pool ledge Amy Hooper PT, [place]  Amy Hooper, Amy Hooper, [facility] Certified Exercise Expert for the Aging Adult  06/25/20 5:07 PM Phone: [phone] Fax: [phone]

## 2020-07-01 ENCOUNTER — Other Ambulatory Visit: Payer: Self-pay

## 2020-07-01 ENCOUNTER — Ambulatory Visit (HOSPITAL_BASED_OUTPATIENT_CLINIC_OR_DEPARTMENT_OTHER)
Payer: No Typology Code available for payment source | Attending: Physical Medicine & Rehabilitation | Admitting: Physical Therapy

## 2020-07-01 DIAGNOSIS — G8929 Other chronic pain: Secondary | ICD-10-CM

## 2020-07-01 DIAGNOSIS — M6281 Muscle weakness (generalized): Secondary | ICD-10-CM

## 2020-07-01 DIAGNOSIS — R293 Abnormal posture: Secondary | ICD-10-CM

## 2020-07-01 DIAGNOSIS — M797 Fibromyalgia: Secondary | ICD-10-CM | POA: Diagnosis not present

## 2020-07-01 DIAGNOSIS — M79605 Pain in left leg: Secondary | ICD-10-CM | POA: Diagnosis present

## 2020-07-01 DIAGNOSIS — M25511 Pain in right shoulder: Secondary | ICD-10-CM | POA: Insufficient documentation

## 2020-07-01 DIAGNOSIS — M79604 Pain in right leg: Secondary | ICD-10-CM | POA: Diagnosis present

## 2020-07-01 DIAGNOSIS — R600 Localized edema: Secondary | ICD-10-CM | POA: Diagnosis present

## 2020-07-01 NOTE — Therapy (Signed)
[place], [place], [address] Phone: [phone]   Fax:  [phone]  Physical Therapy Treatment  Patient Details  Name: Amy Hooper MRN: [id number] Date of Birth: 11/04/1950 Referring Provider (PT): Jamse Arn, MD   Encounter Date: 07/01/2020   PT End of Session - 07/01/20 1701    Visit Number 6    Number of Visits 17    Date for PT Re-Evaluation 07/25/20    Authorization Type PHCS multiplan    PT Start Time 1600    PT Stop Time 1645    PT Time Calculation (min) 45 min    Activity Tolerance Patient tolerated treatment well    Behavior During Therapy [facility] for tasks assessed/performed           Past Medical History:  Diagnosis Date  . Chronic kidney disease   . Depression   . Fibromyalgia   . Gout   . HTN (hypertension)   . Insomnia   . Morbid obesity ([place])   . Osteoarthritis   . Palpitation     Past Surgical History:  Procedure Laterality Date  . KNEE ARTHROSCOPY    . right rotator cuff    . ROTATOR CUFF REPAIR Left   . TUBAL LIGATION      There were no vitals filed for this visit.   Subjective Assessment - 07/01/20 1608    Subjective Pt states she really enjoyed aquatics. Pt reports something aggravated her back ~2nd to last treatment in the water. Pt has been doing things to strengthen her knee. Pt reports pain in her back.    Pertinent History Fibromyalgia, OA, DDD, gets regular steroid shots in L knee and R hip    How long can you sit comfortably? Aggravates back; difficulty getting up    How long can you stand comfortably? n/a    How long can you walk comfortably? Does not walk too much due to L knee pain    Patient Stated Goals Increase movement, decrease pain    Currently in Pain? Yes    Pain Score 6     Pain Location Back    Pain Orientation Mid    Pain Descriptors / Indicators Sore    Pain Type Chronic pain    Pain Radiating Towards Left and right               [facility] PT Assessment - 07/01/20 0001      Single Leg Stance   Comments R: 25 sec      Transfers   Five time sit to stand comments  24 sec without UEs                         [facility] Adult PT Treatment/Exercise - 07/01/20 0001      Exercises   Exercises Knee/Hip      Knee/Hip Exercises: Stretches   Piriformis Stretch Right;Left;30 seconds    Other Knee/Hip Stretches Sidelying open/close book bilat 5x10 sec hold    Other Knee/Hip Stretches LTR 5x10 sec      Knee/Hip Exercises: Aerobic   Nustep L3 x 6 min      Manual Therapy   Manual Therapy Soft tissue mobilization;Joint mobilization    Joint Mobilization PA grade II to III thoracic mobs, side glide cervical mobs grade II to III, cervical distraction 3x10 sec, suboccipital release    Soft tissue mobilization Bilat glutes, multifidi, rhomboids/midback; upper traps, levator  PT Short Term Goals - 07/01/20 1643      PT SHORT TERM GOAL #1   Title Pt will be independent with initial HEP    Time 4    Period Weeks    Status Achieved    Target Date 06/27/20      PT SHORT TERM GOAL #2   Title Pt will be able to perform 5x STS without using UEs in <20 sec    Baseline Currently needs use of UEs to come to full standing; 24 sec without UEs    Time 4    Period Weeks    Status On-going    Target Date 06/27/20      PT SHORT TERM GOAL #3   Title Pt will be able to stand on R LE for at least 8 sec to demo improved stability    Baseline 4 sec on R; 25 sec on R    Time 4    Period Weeks    Status Achieved    Target Date 06/27/20             PT Long Term Goals - 05/30/20 1128      PT LONG TERM GOAL #1   Title Pt will report decrease in general pain by 50%    Time 8    Period Weeks    Status New    Target Date 07/25/20      PT LONG TERM GOAL #2   Title Pt will be able to self manage symptoms with exercises and stretches independently    Time 8    Period Weeks     Status New    Target Date 07/25/20      PT LONG TERM GOAL #3   Title Pt will demo 5x STS <13 sec for improved functional strength    Time 8    Period Weeks    Status New    Target Date 07/25/20                 Plan - 07/01/20 1658    Clinical Impression Statement Treatment focused on reducing low back pain that she has been feeling x 2 weeks. Manual therapy applied to address cervical, thoracic and lumbar spine. Reassessed STGs -- pt has met STG #1 and #3. In regards to #2, pt is now able to perform STS without UEs; however requires 24 sec. Pt will benefit from continued PT to improve her strength and mobility.    Personal Factors and Comorbidities Age;Comorbidity 1;Comorbidity 3+;Comorbidity 2;Past/Current Experience;Profession;Time since onset of injury/illness/exacerbation    Comorbidities Fibromyalgia, DDD, OA    Examination-Activity Limitations Sit;Stand;Transfers;Locomotion Level;Sleep    Examination-Participation Restrictions Community Activity;Driving;Occupation    PT Frequency 2x / week    PT Duration 8 weeks    PT Treatment/Interventions Aquatic Therapy;ADLs/Self Care Home Management;Electrical Stimulation;Traction;Moist Heat;Iontophoresis 51m/ml Dexamethasone;Gait training;Stair training;Functional mobility training;Therapeutic activities;Therapeutic exercise;Balance training;Neuromuscular re-education;Patient/family education;Manual techniques;Passive range of motion;Dry needling;Taping;Vasopneumatic Device    PT Next Visit Plan Assess response to HEP. Consider manual therapy and dry needling for pt's neck and lumbar/glutes. Provide pt with stretches in standing. Continue gentle strengthening for quads, hips, and core.    PT Home Exercise Plan Access Code [email or web address]  aquatics [email or web address]    Consulted and Agree with Plan of Care Patient           Patient will benefit from skilled therapeutic intervention in order to improve the following deficits and impairments:  Decreased  endurance,Hypomobility,Decreased activity tolerance,Decreased strength,Increased fascial restricitons,Pain,Decreased balance,Decreased  mobility,Difficulty walking,Decreased range of motion,Impaired flexibility,Postural dysfunction  Visit Diagnosis: Fibromyalgia  Muscle weakness (generalized)  Abnormal posture  Pain in both lower extremities  Chronic right shoulder pain  Localized edema     Problem List Patient Active Problem List   Diagnosis Date Noted  . OSA (obstructive sleep apnea) 05/19/2020  . 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/17/2020  . Sleep disturbance 04/17/2020  . Chronic pain of both shoulders 03/03/2020  . Pain in both hands 03/03/2020  . Primary osteoarthritis of both knees 03/03/2020  . Pain in both feet 03/03/2020  . History of chronic kidney disease 03/03/2020  . DDD (degenerative disc disease), lumbar 02/05/2020  . Vitamin D deficiency 01/09/2020  . Hyperlipidemia 01/09/2020  . CKD (chronic kidney disease) stage 3, GFR 30-59 ml/min (HCC) 01/09/2020  . HTN (hypertension)   . Morbid obesity ([place])   . Gout   . Fibromyalgia   . Depression   . Insomnia     Gellen April Ma L [place] PT, DPT 07/01/2020, 5:02 PM  [facility] [place], [place], [address] Phone: [phone]   Fax:  [phone]  Name: Keerat Denicola MRN: [id number] Date of Birth: Mar 02, 1951

## 2020-07-08 ENCOUNTER — Ambulatory Visit (INDEPENDENT_AMBULATORY_CARE_PROVIDER_SITE_OTHER): Payer: No Typology Code available for payment source | Admitting: Physical Medicine and Rehabilitation

## 2020-07-08 ENCOUNTER — Encounter: Payer: Self-pay | Admitting: Physical Medicine and Rehabilitation

## 2020-07-08 ENCOUNTER — Ambulatory Visit (HOSPITAL_BASED_OUTPATIENT_CLINIC_OR_DEPARTMENT_OTHER): Payer: No Typology Code available for payment source | Admitting: Physical Therapy

## 2020-07-08 ENCOUNTER — Other Ambulatory Visit: Payer: Self-pay

## 2020-07-08 VITALS — BP 139/82 | HR 76

## 2020-07-08 DIAGNOSIS — G8929 Other chronic pain: Secondary | ICD-10-CM

## 2020-07-08 DIAGNOSIS — G894 Chronic pain syndrome: Secondary | ICD-10-CM | POA: Diagnosis not present

## 2020-07-08 DIAGNOSIS — M47816 Spondylosis without myelopathy or radiculopathy, lumbar region: Secondary | ICD-10-CM

## 2020-07-08 DIAGNOSIS — M545 Low back pain, unspecified: Secondary | ICD-10-CM

## 2020-07-08 DIAGNOSIS — M797 Fibromyalgia: Secondary | ICD-10-CM

## 2020-07-08 DIAGNOSIS — M6281 Muscle weakness (generalized): Secondary | ICD-10-CM

## 2020-07-08 DIAGNOSIS — R293 Abnormal posture: Secondary | ICD-10-CM

## 2020-07-08 NOTE — Therapy (Signed)
[place], [place], [address] Phone: [phone]   Fax:  [phone]  Physical Therapy Treatment  Patient Details  Name: Amy Hooper MRN: [id number] Date of Birth: 08/20/1950 Referring Provider (PT): Jamse Arn, MD   Encounter Date: 07/08/2020   PT End of Session - 07/08/20 1653    Visit Number 7    Number of Visits 17    Date for PT Re-Evaluation 07/25/20    Authorization Type PHCS multiplan    PT Start Time 1605    PT Stop Time 1645    PT Time Calculation (min) 40 min    Activity Tolerance Patient tolerated treatment well    Behavior During Therapy [facility] for tasks assessed/performed           Past Medical History:  Diagnosis Date  . Chronic kidney disease   . Depression   . Fibromyalgia   . Gout   . HTN (hypertension)   . Insomnia   . Morbid obesity ([place])   . Osteoarthritis   . Palpitation     Past Surgical History:  Procedure Laterality Date  . KNEE ARTHROSCOPY    . right rotator cuff    . ROTATOR CUFF REPAIR Left   . TUBAL LIGATION      There were no vitals filed for this visit.   Subjective Assessment - 07/08/20 1607    Subjective Pt reports her doctor wants to do an ablation for her back and is getting her insurance approval. Pt states that self massage and stretches have been helping.    Pertinent History Fibromyalgia, OA, DDD, gets regular steroid shots in L knee and R hip    How long can you sit comfortably? Aggravates back; difficulty getting up    How long can you stand comfortably? n/a    How long can you walk comfortably? Does not walk too much due to L knee pain    Patient Stated Goals Increase movement, decrease pain    Currently in Pain? No/denies    Pain Location Back    Pain Orientation Mid    Pain Descriptors / Indicators Sore                             [facility] Adult PT Treatment/Exercise - 07/08/20 0001      Knee/Hip  Exercises: Stretches   Hip Flexor Stretch Right;Left;30 seconds    Other Knee/Hip Stretches Hips forward on plinth    Other Knee/Hip Stretches L stretch 4x20 sec with side flexion x20 sec bilat; standing open/close book; LTR x10 bilat      Knee/Hip Exercises: Aerobic   Nustep L4 x 6 min      Knee/Hip Exercises: Standing   Other Standing Knee Exercises Table plank 3x10 sec    Other Standing Knee Exercises Table side plank with thoracic rotation 3x10 sec      Knee/Hip Exercises: Supine   Other Supine Knee/Hip Exercises PPT with marching 2x10    Other Supine Knee/Hip Exercises 90/90 alternating heel tap x5      Knee/Hip Exercises: Sidelying   Clams 2x10 red tband                    PT Short Term Goals - 07/01/20 1643      PT SHORT TERM GOAL #1   Title Pt will be independent with initial HEP    Time 4    Period  Weeks    Status Achieved    Target Date 06/27/20      PT SHORT TERM GOAL #2   Title Pt will be able to perform 5x STS without using UEs in <20 sec    Baseline Currently needs use of UEs to come to full standing; 24 sec without UEs    Time 4    Period Weeks    Status On-going    Target Date 06/27/20      PT SHORT TERM GOAL #3   Title Pt will be able to stand on R LE for at least 8 sec to demo improved stability    Baseline 4 sec on R; 25 sec on R    Time 4    Period Weeks    Status Achieved    Target Date 06/27/20             PT Long Term Goals - 05/30/20 1128      PT LONG TERM GOAL #1   Title Pt will report decrease in general pain by 50%    Time 8    Period Weeks    Status New    Target Date 07/25/20      PT LONG TERM GOAL #2   Title Pt will be able to self manage symptoms with exercises and stretches independently    Time 8    Period Weeks    Status New    Target Date 07/25/20      PT LONG TERM GOAL #3   Title Pt will demo 5x STS <13 sec for improved functional strength    Time 8    Period Weeks    Status New    Target Date  07/25/20                 Plan - 07/08/20 1652    Clinical Impression Statement Progressed core and hip strengthening. Provided pt with stretches in standing. Pt tolerated well.    Personal Factors and Comorbidities Age;Comorbidity 1;Comorbidity 3+;Comorbidity 2;Past/Current Experience;Profession;Time since onset of injury/illness/exacerbation    Comorbidities Fibromyalgia, DDD, OA    Examination-Activity Limitations Sit;Stand;Transfers;Locomotion Level;Sleep    Examination-Participation Restrictions Community Activity;Driving;Occupation    PT Frequency 2x / week    PT Duration 8 weeks    PT Treatment/Interventions Aquatic Therapy;ADLs/Self Care Home Management;Electrical Stimulation;Traction;Moist Heat;Iontophoresis 4mg /ml Dexamethasone;Gait training;Stair training;Functional mobility training;Therapeutic activities;Therapeutic exercise;Balance training;Neuromuscular re-education;Patient/family education;Manual techniques;Passive range of motion;Dry needling;Taping;Vasopneumatic Device    PT Next Visit Plan Assess response to HEP. Consider manual therapy and dry needling for pt's neck and lumbar/glutes. Continue stretches as needed. Continue gentle strengthening for quads, hips, and core.    PT Home Exercise Plan Access Code [email or web address]  aquatics [email or web address]    Consulted and Agree with Plan of Care Patient           Patient will benefit from skilled therapeutic intervention in order to improve the following deficits and impairments:  Decreased endurance,Hypomobility,Decreased activity tolerance,Decreased strength,Increased fascial restricitons,Pain,Decreased balance,Decreased mobility,Difficulty walking,Decreased range of motion,Impaired flexibility,Postural dysfunction  Visit Diagnosis: Fibromyalgia  Muscle weakness (generalized)  Abnormal posture     Problem List Patient Active Problem List   Diagnosis Date Noted  . OSA (obstructive sleep apnea) 05/19/2020  . Bilateral  post-traumatic osteoarthritis of knee 04/17/2020  . Sleep disturbance 04/17/2020  . Chronic pain of both shoulders 03/03/2020  . Pain in both hands 03/03/2020  . Primary osteoarthritis of both knees 03/03/2020  . Pain in both feet 03/03/2020  . History of chronic  kidney disease 03/03/2020  . DDD (degenerative disc disease), lumbar 02/05/2020  . Vitamin D deficiency 01/09/2020  . Hyperlipidemia 01/09/2020  . CKD (chronic kidney disease) stage 3, GFR 30-59 ml/min (HCC) 01/09/2020  . HTN (hypertension)   . Morbid obesity ([place])   . Gout   . Fibromyalgia   . Depression   . Insomnia     Dat Derksen April Ma L [place] PT, DPT 07/08/2020, 4:54 PM  [facility] [address] [place], [place], [address] Phone: [phone]   Fax:  [phone]  Name: Amy Hooper MRN: [id number] Date of Birth: 10/10/1950

## 2020-07-08 NOTE — Progress Notes (Signed)
Pt state lower back pain. Pt state walking, standing and sitting makes the pain worse. Pt state she feels sore a lot of the time. Pt state she takes pain meds and use heating pads to help ease her pain.  Numeric Pain Rating Scale and Functional Assessment Average Pain 8 Pain Right Now 6 My pain is intermittent and aching Pain is worse with: walking, sitting, standing and some activites Pain improves with: heat/ice and medication   In the last MONTH (on 0-10 scale) has pain interfered with the following?  1. General activity like being  able to carry out your everyday physical activities such as walking, climbing stairs, carrying groceries, or moving a chair?  Rating(8)  2. Relation with others like being able to carry out your usual social activities and roles such as  activities at home, at work and in your community. Rating(8)  3. Enjoyment of life such that you have  been bothered by emotional problems such as feeling anxious, depressed or irritable?  Rating(8)

## 2020-07-09 ENCOUNTER — Ambulatory Visit: Payer: No Typology Code available for payment source | Admitting: Physical Medicine and Rehabilitation

## 2020-07-09 ENCOUNTER — Encounter: Payer: No Typology Code available for payment source | Admitting: Internal Medicine

## 2020-07-10 ENCOUNTER — Ambulatory Visit: Payer: Self-pay | Admitting: Neurology

## 2020-07-10 ENCOUNTER — Encounter: Payer: Self-pay | Admitting: Physical Medicine and Rehabilitation

## 2020-07-10 NOTE — Progress Notes (Signed)
Amy Hooper - 70 y.o. female MRN [id number]  Date of birth: [birth year]  Office Visit Note: Visit Date: 07/08/2020 PCP: Isaac Bliss, [name], MD Referred by: Isaac Bliss, [place]*  Subjective: Chief Complaint  Patient presents with  . Lower Back - Pain   HPI: Amy Hooper is a 70 y.o. female who comes in today For evaluation management of chronic worsening back pain worse with standing and walking and really not much down the legs at this point.  She reports she reports average pain of 8 out of 10 which is intermittent aching worse with walking and going from sit to stand and sometimes prolonged sitting.  She gets little bit of relief with heat ice medication and exercise.  Her case is complicated from a chronic pain standpoint and this can be reviewed in all of her notes but basically she has fibromyalgia she has multi joint arthritis including arthritic changes of the lumbar spine and she is followed by Dr. Eduard Roux in our office from an orthopedic standpoint and he is going to complete a total knee replacement on the left in June.  She is also followed by Dr. Delice Lesch from a chronic pain management standpoint at the [facility] for [facility].  He follows her from a medication standpoint and does do trigger point injections at times as well as coordinates or physical therapy.  She has been in extensive physical therapy for her back and knees through the [facility] location and now at [facility].  Brief review of history with me is that she was referred to [place] by her primary care physician  [name], MD in the fall of last year but had also been referred at that time by Dr. Cy Blamer to physical medicine and rehabilitation at [facility].  Somewhat convoluted referral process and she was seeing multiple people at that time.  Nonetheless we did see her with evaluation and decided to complete L4 transforaminal  epidural steroid injections bilaterally.  This is based on MRI findings of lateral recess narrowing and broad disc bulging likely impacting without focal compression nerve roots on either side bilaterally and this was accounting for hopefully her radicular type pain down the legs.  She actually had outstanding relief from that and that is well-documented and she was ecstatic with the amount of relief she had.  In fact she reports the leg pain going down the legs is really not returned is mainly her low back at this point with ongoing issues of standing and twisting and mechanical complaints.  She is intolerant to a lot of medications and functionally this is is limiting what she would like to be able to do.  Review of Systems  Musculoskeletal: Positive for back pain and joint pain.  All other systems reviewed and are negative.  Otherwise per HPI.  Assessment & Plan: Visit Diagnoses:    ICD-10-CM   1. Spondylosis without myelopathy or radiculopathy, lumbar region  M47.816   2. Chronic bilateral low back pain without sciatica  M54.50    G89.29   3. Fibromyalgia  M79.7   4. Chronic pain syndrome  G89.4      Plan: Findings:  Chronic worsening severe axial low back pain worse with standing and extension and exam consistent with facet mediated low back pain.  Prior epidural injections gave her quite a bit of relief with her back and leg pain but now she has minimal leg pain and a  great deal of back pain.  Her case is complicated by fibromyalgia as well as multiple joint arthritis and she is undergoing total knee replacement in June.  I think at this point the right neck step is diagnostic medial branch blocks of the lower facet joints.  This will be done with a pain diary and a double block paradigm if she does well we could look at ablation for more longer term relief.  I did give her some information on ablations.  We will go ahead and get her scheduled for that.  If she did not get diagnostic relief  could look at repeating epidural injection.  She is somewhat complicated from a pain standpoint but she is doing well with Dr. Posey Pronto and will continue to see him for overall coordination of care.    Meds & Orders: No orders of the defined types were placed in this encounter.  No orders of the defined types were placed in this encounter.   Follow-up: Return for Bilateral L4-5 L5-S1 medial branch blocks.   Procedures: No procedures performed      Clinical History: MRI LUMBAR SPINE WITHOUT CONTRAST  TECHNIQUE: Multiplanar, multisequence MR imaging of the lumbar spine was performed. No intravenous contrast was administered.  COMPARISON: None.  FINDINGS: Segmentation: 5 lumbar type vertebral bodies. The last full intervertebral disc space is labeled L5-S1.  Alignment: Normal  Vertebrae: No bone lesions or fracture. A few small scattered hemangiomas are noted.  Conus medullaris: Extends to the bottom of L1 level and appears normal.  Paraspinal and other soft tissues: No significant findings.  Disc levels:  L1-2: No significant findings.  L2-3: No significant findings.  L3-4: Mild to moderate facet disease but no disc protrusions, spinal or foraminal stenosis.  L4-5: Moderate facet disease and mild ligamentum flavum thickening. Possible small synovial cyst on the right side. Mild spinal and bilateral lateral recess stenosis but no foraminal stenosis.  L5-S1: Bulging annulus and central disc protrusion with mass effect on the thecal sac move and possibly irritating both S1 nerve roots. The exiting L5 nerve roots are normal. Moderate facet disease.  IMPRESSION: 1. 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 2. Central disc protrusion at L5-S1 with mass effect on the ventral thecal sac and possible irritation of both S1 nerve roots.   Electronically Signed By: Marijo Sanes M.D. On: 09/04/2016 10:30   She reports that she has never smoked. She has  never used smokeless tobacco.  Recent Labs    02/05/20 0902  LABURIC 4.2    Objective:  VS:  HT:    WT:   BMI:     BP:139/82  HR:76bpm  TEMP: ( )  RESP:  Physical Exam Vitals and nursing note reviewed.  Constitutional:      General: She is not in acute distress.    Appearance: Normal appearance. She is obese. She is not ill-appearing.  HENT:     Head: Normocephalic and atraumatic.     Right Ear: External ear normal.     Left Ear: External ear normal.  Eyes:     Extraocular Movements: Extraocular movements intact.  Cardiovascular:     Rate and Rhythm: Normal rate.     Pulses: Normal pulses.  Pulmonary:     Effort: Pulmonary effort is normal. No respiratory distress.  Abdominal:     General: There is no distension.     Palpations: Abdomen is soft.  Musculoskeletal:        General: Tenderness present.  Cervical back: Neck supple.     Right lower leg: No edema.     Left lower leg: No edema.     Comments: Patient has good distal strength with no pain over the greater trochanters.  No clonus or focal weakness. Patient somewhat slow to rise from a seated position to full extension.  There is concordant low back pain with facet loading and lumbar spine extension rotation.  There are no definitive trigger points but the patient is somewhat tender across the lower back and PSIS.  There is no pain with hip rotation.   Skin:    Findings: No erythema, lesion or rash.  Neurological:     General: No focal deficit present.     Mental Status: She is alert and oriented to person, place, and time.     Sensory: No sensory deficit.     Motor: No weakness or abnormal muscle tone.     Coordination: Coordination normal.  Psychiatric:        Mood and Affect: Mood normal.        Behavior: Behavior normal.     Ortho Exam  Imaging: No results found.  Past Medical/Family/Surgical/Social History: Medications & Allergies reviewed per EMR, new medications updated. Patient Active  Problem List   Diagnosis Date Noted  . OSA (obstructive sleep apnea) 05/19/2020  . 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/17/2020  . Sleep disturbance 04/17/2020  . Chronic pain of both shoulders 03/03/2020  . Pain in both hands 03/03/2020  . Primary osteoarthritis of both knees 03/03/2020  . Pain in both feet 03/03/2020  . History of chronic kidney disease 03/03/2020  . DDD (degenerative disc disease), lumbar 02/05/2020  . Vitamin D deficiency 01/09/2020  . Hyperlipidemia 01/09/2020  . CKD (chronic kidney disease) stage 3, GFR 30-59 ml/min (HCC) 01/09/2020  . HTN (hypertension)   . Morbid obesity ([place])   . Gout   . Fibromyalgia   . Depression   . Insomnia    Past Medical History:  Diagnosis Date  . Chronic kidney disease   . Depression   . Fibromyalgia   . Gout   . HTN (hypertension)   . Insomnia   . Morbid obesity ([place])   . Osteoarthritis   . Palpitation    Family History  Problem Relation Age of Onset  . Hypothyroidism Mother   . Hyperlipidemia Other   . Kidney failure Father   . Dementia Father   . Gout Father   . Arthritis Father   . Prostate cancer Father    Past Surgical History:  Procedure Laterality Date  . KNEE ARTHROSCOPY    . right rotator cuff    . ROTATOR CUFF REPAIR Left   . TUBAL LIGATION     Social History   Occupational History  . 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 Smoking status: Never Smoker  . Smokeless tobacco: Never Used  Vaping Use  . Vaping Use: Never used  Substance and Sexual Activity  . Alcohol use: Never    Alcohol/week: 0.0 standard drinks  . Drug use: Never  . Sexual activity: Never

## 2020-07-10 NOTE — Progress Notes (Signed)
Amy Hooper - 70 y.o. female MRN [id number]  Date of birth: 30-Mar-1950  Office Visit Note: Visit Date: 03/26/2020 PCP: Amy Hooper, Amy Halsted, MD Referred by: Amy Hooper, Estel*  Subjective: Chief Complaint  Patient presents with   Lower Back - Pain   Right Hip - Pain   Left Hip - Pain   Right Knee - Pain   Left Knee - Pain   HPI: Amy Hooper is a 70 y.o. female who comes in today For evaluation management of chronic worsening back pain worse with standing and walking and referral down the legs at this point.  She reports she reports average pain of 8 out of 10 which is intermittent aching worse with walking and going from sit to stand and sometimes prolonged sitting.  The leg pain is posterior.  Reports that the pain is unbearable at night.  She gets little bit of relief with heat ice medication and exercise.  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  He follows her from a medication standpoint and does do trigger point injections at times as well as coordinates or physical therapy.  She has been in extensive physical therapy for her back and knees through the [facility] location and now at [place].  Brief review of history with me is that she was referred to [place] by her primary care physician  Amy Hau, MD in the fall of last year but had also been referred at that time by Amy Hooper to physical medicine and rehabilitation at [facility].  She reports that last year she saw  Amy Hooper at [place].  I did review his notes and it appears that he did medial branch blocks of the facet joints that she was not having much in the way of leg pain.  Diagnostically she did get a lot of relief from those facet joint blocks but has not had radiofrequency ablation which was his plan at some point.  She is intolerant to a lot of medications and functionally this is is limiting what she would like to be able to do.  Review of Systems  Musculoskeletal:  Positive for back pain and joint pain.  Neurological:  Positive for tingling and weakness.  All other systems reviewed and are negative. Otherwise per HPI.  Assessment & Plan: Visit Diagnoses:    ICD-10-CM   1. Lumbar radiculopathy  M54.16     2. Radiculopathy due to lumbar intervertebral disc disorder  M51.16     3. Spondylosis without myelopathy or radiculopathy, lumbar region  M47.816     4. Chronic bilateral low back pain with bilateral sciatica  M54.42    M54.41    G89.29     5. Fibromyalgia  M79.7     6. Chronic pain syndrome  G89.4        Plan: Findings:  Chronic history of low back pain and radicular type leg pain do predominantly the lateral recess stenosis from MRI in 2018.  Prior transforaminal injections  did give her a lot of relief for that.  She has seen Amy Hooper more recently than me and he completed diagnostic medial branch blocks for her back pain which were successful.  His notes are reviewed and in the chart.  She is also followed by Amy Hooper from a pain management standpoint and receives coordinated physical therapy as well as trigger point injections and medication.  I think at this point given the fact that she is having this radicular pain we should look at bilateral L4 transforaminal injections.  Depending on relief would consider repeat MRI of the lumbar spine.  I will try to coordinate care with Amy Hooper as good as we can.  Should continue to see Amy Hooper for her knees.  We had a  long discussion again about fibromyalgia and this underlying central sensitization pain syndrome.   Meds & Orders: No orders of the defined types were placed in this encounter.  No orders of the defined types were placed in this encounter.   Follow-up: Return for Bilateral L4 transforaminal epidural steroid injection.   Procedures: No procedures performed      Clinical History: MRI LUMBAR SPINE WITHOUT CONTRAST  TECHNIQUE: Multiplanar, multisequence MR imaging of the lumbar spine was performed. No intravenous contrast was administered.  COMPARISON: None.  FINDINGS: Segmentation: 5 lumbar type vertebral bodies. The last full intervertebral disc space is labeled L5-S1.  Alignment: Normal  Vertebrae: No bone lesions or fracture. A few small scattered hemangiomas are noted.  Conus medullaris: Extends to the bottom of L1 level and appears normal.  Paraspinal and other soft tissues: No significant findings.  Disc levels:  L1-2: No significant findings.  L2-3: No significant findings.  L3-4: Mild to moderate facet disease but no disc protrusions, spinal or foraminal stenosis.  L4-5: Moderate facet disease and mild ligamentum flavum thickening. Possible small synovial cyst on the right side. Mild spinal and bilateral lateral recess stenosis but no foraminal stenosis.  L5-S1: Bulging annulus and central disc protrusion with mass effect on the thecal sac move and possibly irritating both S1 nerve roots. The exiting L5 nerve roots are normal. Moderate facet disease.  IMPRESSION: 1. 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 2. Central disc protrusion at L5-S1 with mass effect on the ventral thecal sac and possible irritation of both S1 nerve roots.   Electronically Signed By: Marijo Hooper M.D. On: 09/04/2016 10:30   She reports that she has never smoked. She has never used smokeless tobacco.  Recent Labs    02/05/20 0902 09/03/20 1040  HGBA1C  --   5.7  LABURIC 4.2  --     Objective:  VS:  HT:5\' 3"  (160 cm)   WT:203 lb 12.8 oz (92.4 kg)  BMI:36.11    BP:(!) 142/74  HR:71bpm  TEMP: ( )  RESP:  Physical Exam Vitals and nursing note reviewed.  Constitutional:      General: She is not in acute distress.    Appearance: Normal appearance. She is obese. She is not ill-appearing.  HENT:     Head: Normocephalic and atraumatic.     Right Ear: External ear normal.     Left Ear: External ear normal.  Eyes:     Extraocular Movements: Extraocular movements intact.  Cardiovascular:     Rate and Rhythm: Normal rate.     Pulses: Normal pulses.  Pulmonary:     Effort: Pulmonary effort is normal. No respiratory distress.  Abdominal:     General:  There is no distension.     Palpations: Abdomen is soft.  Musculoskeletal:        General: Tenderness present.     Cervical back: Neck supple.     Right lower leg: No edema.     Left lower leg: No edema.     Comments: Patient has good distal strength with mild pain over the greater trochanters.  No clonus or focal weakness.  She does have difficulty going from sit to stand.  She does have positive tender points across the lower spine and PSIS and legs.  Skin:    Findings: No erythema, lesion or rash.  Neurological:     General: No focal deficit present.     Mental Status: She is alert and oriented to person, place, and time.     Sensory: No sensory deficit.     Motor: No weakness or abnormal muscle tone.     Coordination: Coordination normal.  Psychiatric:        Mood and Affect: Mood normal.        Behavior: Behavior normal.    Ortho Exam  Imaging: No results found.  Past Medical/Family/Surgical/Social History: Medications & Allergies reviewed per EMR, new medications updated. Patient Active Problem List   Diagnosis Date Noted   Primary osteoarthritis of left knee 09/08/2020   DJD (degenerative joint disease) of knee 09/08/2020   Status post total left knee replacement  09/08/2020   Spondylosis without myelopathy or radiculopathy, lumbar region 07/17/2020   Chronic pain syndrome 07/17/2020   Myalgia 07/17/2020   OSA (obstructive sleep apnea) 05/19/2020   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/17/2020   Sleep disturbance 04/17/2020   Chronic pain of both shoulders 03/03/2020   Pain in both hands 03/03/2020   Primary osteoarthritis of both knees 03/03/2020   Pain in both feet 03/03/2020   History of chronic kidney disease 03/03/2020   DDD (degenerative disc disease), lumbar 02/05/2020   Vitamin D deficiency 01/09/2020   Hyperlipidemia 01/09/2020   CKD (chronic kidney disease) stage 3, GFR 30-59 ml/min ([place]) 01/09/2020   HTN (hypertension)    Morbid obesity ([place])    Gout    Fibromyalgia    Depression    Insomnia    Past Medical History:  Diagnosis Date   Chronic kidney disease    Depression    Fibromyalgia    Gout    HTN (hypertension)    Insomnia    Morbid obesity ([facility])    Osteoarthritis    Palpitation    Sleep apnea    Family History  Problem Relation Age of Onset   Hypothyroidism Mother    Hyperlipidemia Other    Kidney failure Father    Dementia Father    Gout Father    Arthritis Father    Prostate cancer Father    Past Surgical History:  Procedure Laterality Date   KNEE ARTHROSCOPY     right rotator cuff     ROTATOR CUFF REPAIR Left    TOTAL KNEE ARTHROPLASTY Left 09/08/2020   Procedure: LEFT TOTAL KNEE ARTHROPLASTY;  Surgeon: Leandrew Koyanagi, MD;  Location: [facility];  Service: Orthopedics;  Laterality: Left;   TUBAL LIGATION     Social History   Occupational History   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

## 2020-07-11 NOTE — Telephone Encounter (Signed)
I spoke with patient and Amy Hooper scheduled her CPE.

## 2020-07-14 ENCOUNTER — Ambulatory Visit
Admission: RE | Admit: 2020-07-14 | Discharge: 2020-07-14 | Disposition: A | Discharge status: Home / Self Care | Payer: No Typology Code available for payment source | Source: Ambulatory Visit | Attending: Nephrology | Admitting: Nephrology

## 2020-07-14 ENCOUNTER — Encounter: Payer: Self-pay | Admitting: Internal Medicine

## 2020-07-14 ENCOUNTER — Other Ambulatory Visit: Payer: Self-pay

## 2020-07-14 DIAGNOSIS — N281 Cyst of kidney, acquired: Secondary | ICD-10-CM

## 2020-07-14 IMAGING — MR MR ABDOMEN WO/W CM
11 of 17 series · 29 of 48 positions shown · IV contrast (multihance)
Comparison: Renal ultrasound [DATE].

CLINICAL DATA: Bilateral renal cysts.  Chronic kidney disease.

EXAM:
MRI ABDOMEN WITHOUT AND WITH CONTRAST
TECHNIQUE: Multiplanar multisequence MR imaging of the abdomen was performed
both before and after the administration of intravenous contrast.
CONTRAST:  19mL MULTIHANCE GADOBENATE DIMEGLUMINE 529 MG/ML IV SOLN

[Series 2: T2 · coronal · 5.0mm · 1.45mm/px · 2 of 31 slices shown (1 of 3)]
[im 1/31]
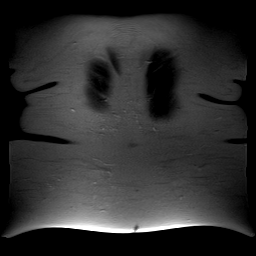
[im 31/31]
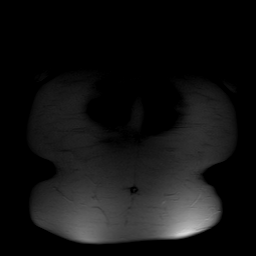

[Series 3: T2 · axial · 5.0mm · 1.41mm/px · z∈[-93,+150]mm · 2 of 40 slices shown (2 of 3)]
[im 1/40]
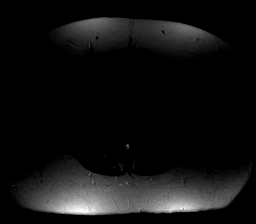
[im 40/40]
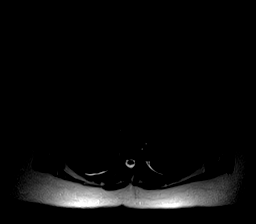

[Series 4: axial in out · axial · 5.5mm · 0.70mm/px · z∈[-95,+152]mm · 4 of 80 slices shown]
[im 1/80]
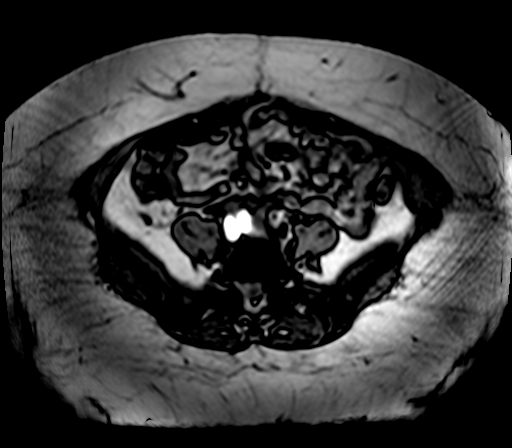
[im 27/80]
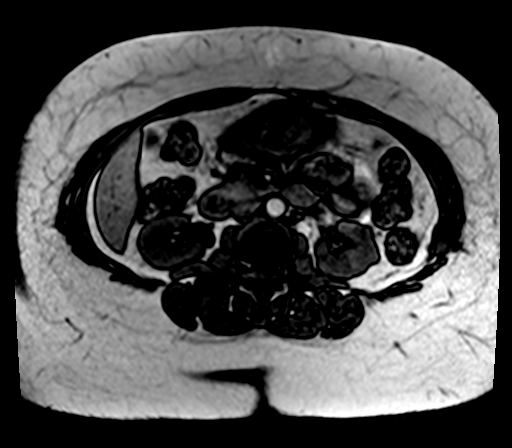
[im 53/80]
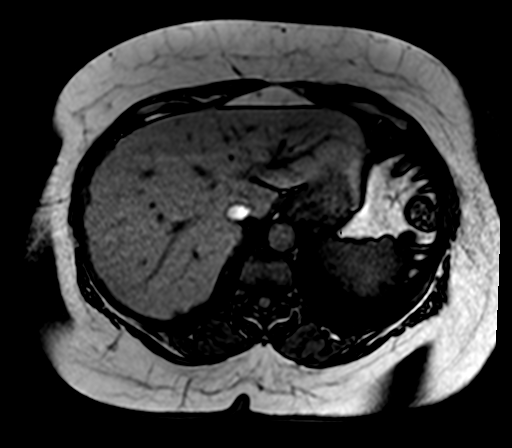
[im 80/80]
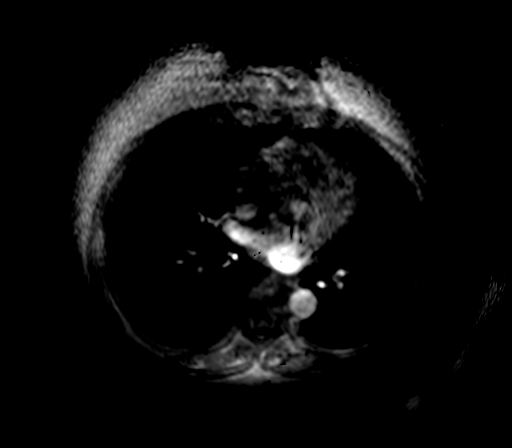

[Series 5: axial tru fisp · axial · 5.0mm · 1.41mm/px · z∈[-89,+146]mm · 2 of 42 slices shown]
[im 1/42]
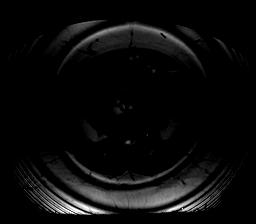
[im 42/42]
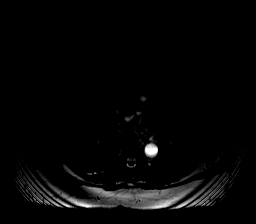

[Series 6: T2 · axial · 5.0mm · 0.70mm/px · z∈[-63,+181]mm · 2 of 40 slices shown (3 of 3)]
[im 1/40]
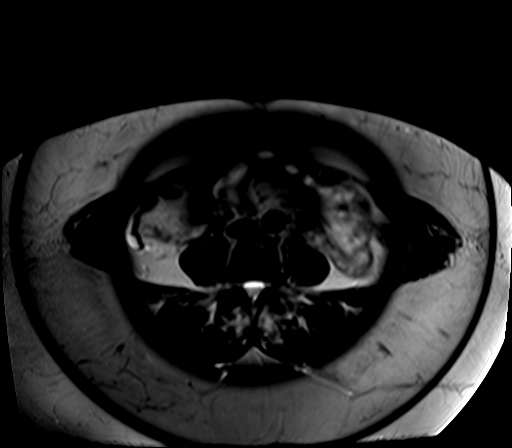
[im 40/40]
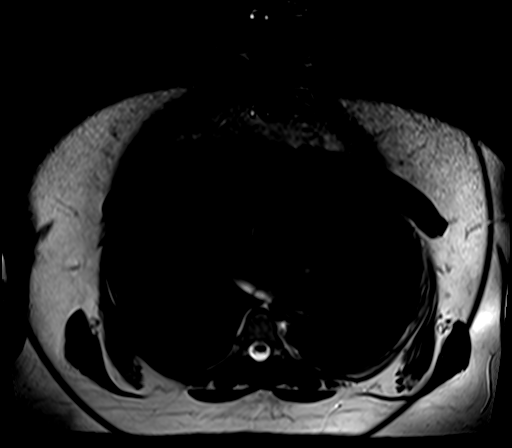

[Series 7: ep2d_[id number]_p2_trig · axial · 5.0mm · 1.88mm/px · z∈[-63,+181]mm · 5 of 120 slices shown]
[im 1/120]
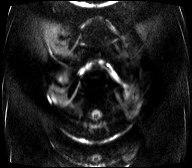
[im 30/120]
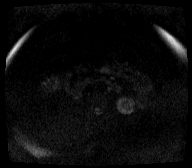
[im 60/120]
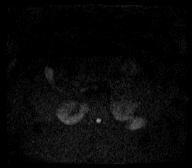
[im 90/120]
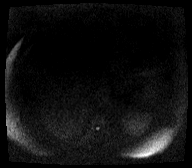
[im 120/120]
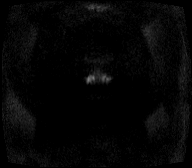

[Series 8: [id number]_trig_adc · axial · 5.0mm · 1.88mm/px · 1 of 40 slices shown]
[im 1/40]
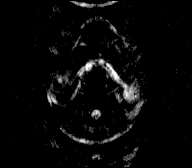

[Series 9: T1 dynamic · axial · non-contrast · 2.0mm · 0.78mm/px · z∈[-74,+132]mm · 3 of 104 slices shown]
[im 1/104]
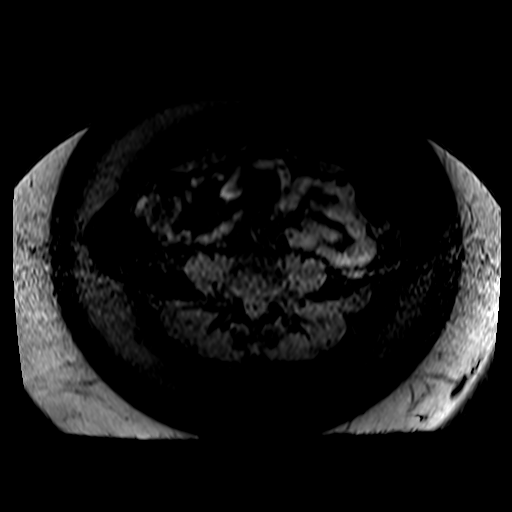
[im 52/104]
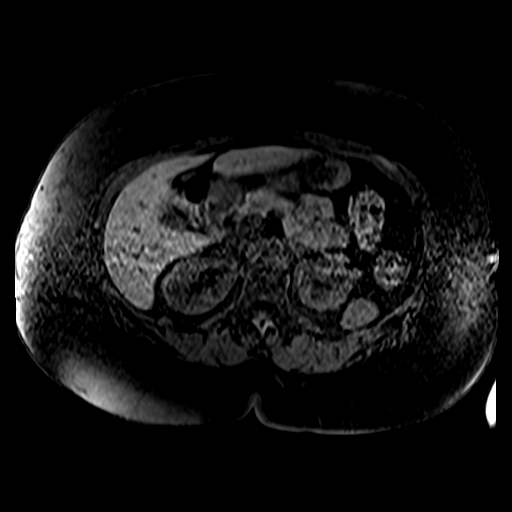
[im 104/104]
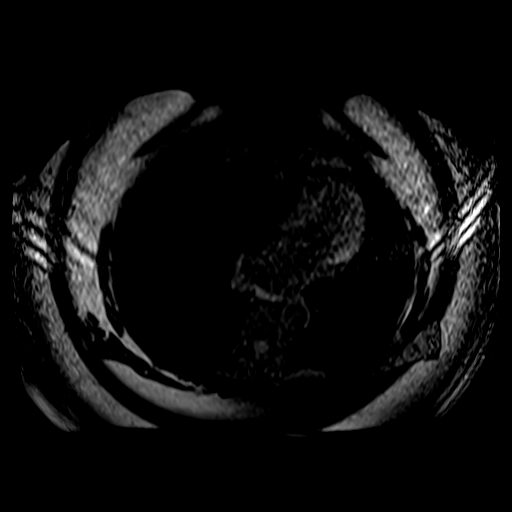

[Series 10: post 25 sec · axial · 2.0mm · 0.78mm/px · z∈[-74,+132]mm · 3 of 104 slices shown]
[im 1/104]
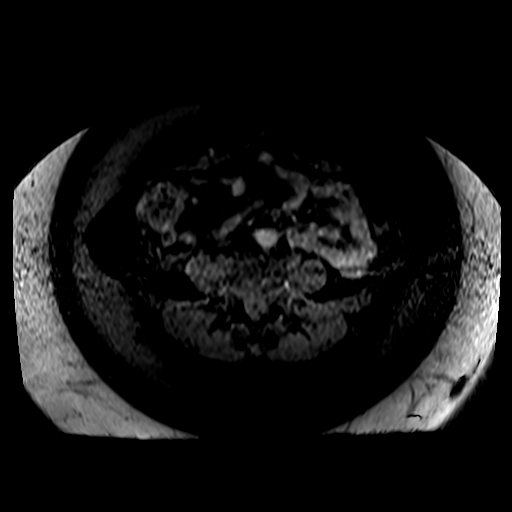
[im 52/104]
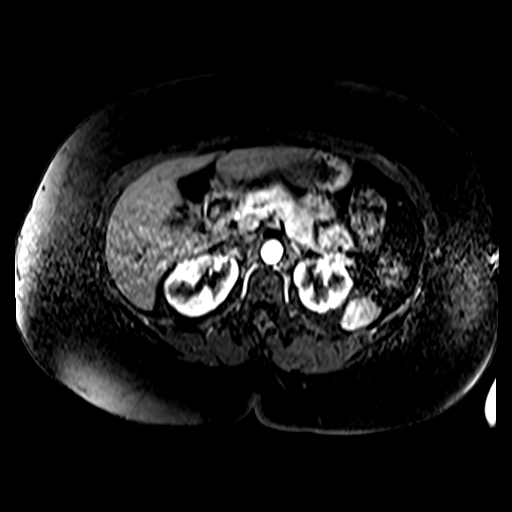
[im 104/104]
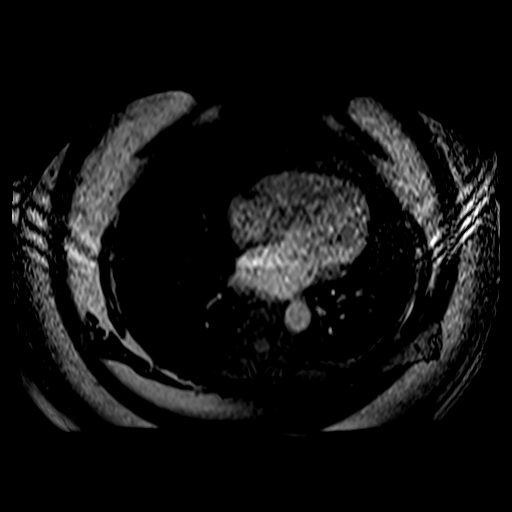

[Series 11: post 25 sec_sub · axial · 2.0mm · 0.78mm/px · z∈[-74,+132]mm · 3 of 104 slices shown]
[im 1/104]
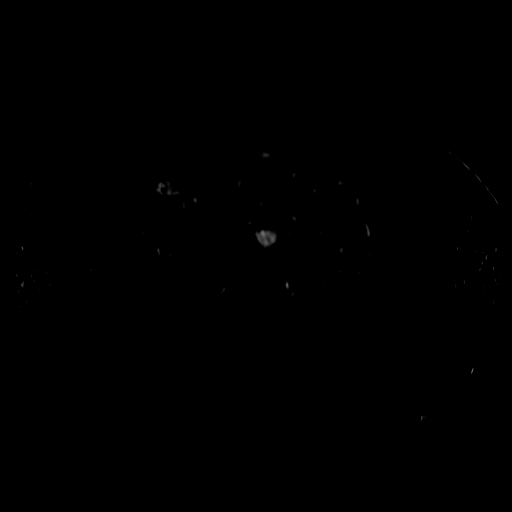
[im 52/104]
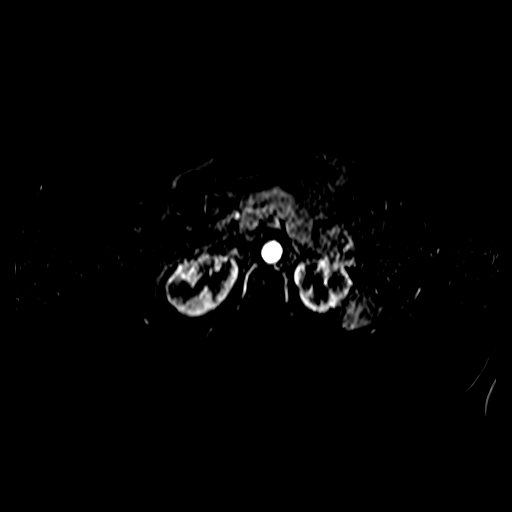
[im 104/104]
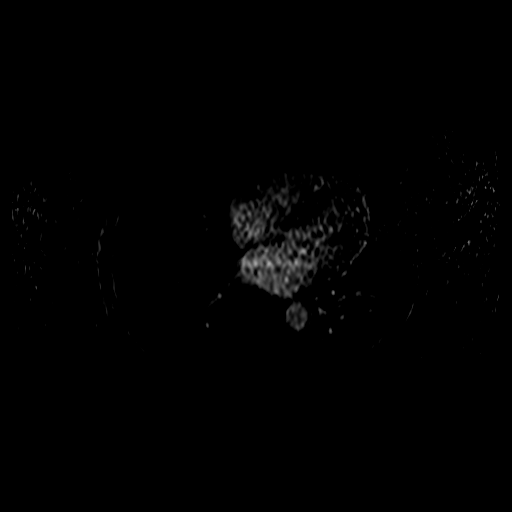

[Series 12: post 45 sec · axial · 2.0mm · 0.78mm/px · z∈[-74,+28]mm · 2 of 104 slices shown]
[im 1/104]
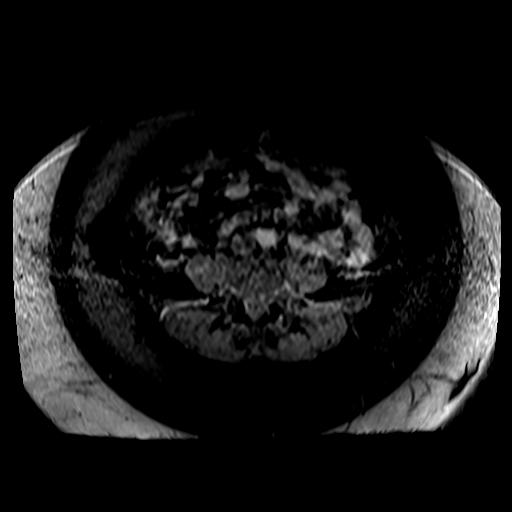
[im 52/104]
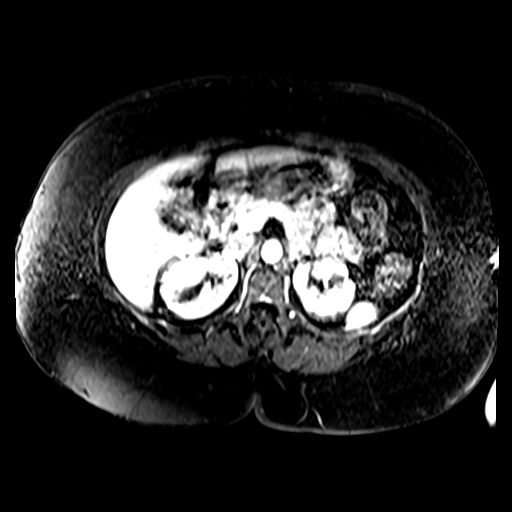

[29 of 48 positions shown; findings below may reference images not displayed]

FINDINGS: Lower chest:  Lung bases are clear.

Hepatobiliary: No focal hepatic lesion. No biliary duct dilatation.
Gallbladder normal. Common bile duct normal.

Pancreas: Normal pancreatic parenchymal intensity. No ductal
dilatation or inflammation.

Spleen: Normal spleen.

Adrenals/urinary tract: Is adrenal glands normal.

Within the upper pole of the RIGHT kidney 12 mm cystic lesion is
high signal intensity on T2 weighted imaging and demonstrates no
post-contrast enhancement consistent typical renal cysts.

The upper pole of the LEFT kidney similar 7 mm nonenhancing cyst.
Additional cyst in the midpole of the LEFT kidney measuring 8 mm
(image 23/series 6) without enhancement.

Stomach/Bowel: Stomach and limited of the small bowel is
unremarkable

Vascular/Lymphatic: Abdominal aortic normal caliber. No
retroperitoneal periportal lymphadenopathy.

Musculoskeletal: No aggressive osseous lesion
IMPRESSION: Bilateral nonenhancing benign renal cysts ([place] 1).

## 2020-07-14 MED ORDER — GADOBENATE DIMEGLUMINE 529 MG/ML IV SOLN
19.0000 mL | Freq: Once | INTRAVENOUS | Status: AC | PRN
  Administered 2020-07-14: 19 mL via INTRAVENOUS

## 2020-07-15 ENCOUNTER — Ambulatory Visit (HOSPITAL_BASED_OUTPATIENT_CLINIC_OR_DEPARTMENT_OTHER): Payer: No Typology Code available for payment source | Admitting: Physical Therapy

## 2020-07-15 ENCOUNTER — Ambulatory Visit (INDEPENDENT_AMBULATORY_CARE_PROVIDER_SITE_OTHER): Payer: No Typology Code available for payment source | Admitting: Family Medicine

## 2020-07-15 ENCOUNTER — Other Ambulatory Visit: Payer: Self-pay

## 2020-07-15 ENCOUNTER — Encounter: Payer: Self-pay | Admitting: Family Medicine

## 2020-07-15 VITALS — BP 140/64 | HR 79 | Temp 98.2°F | Wt 213.3 lb

## 2020-07-15 DIAGNOSIS — S60862A Insect bite (nonvenomous) of left wrist, initial encounter: Secondary | ICD-10-CM

## 2020-07-15 DIAGNOSIS — W57XXXA Bitten or stung by nonvenomous insect and other nonvenomous arthropods, initial encounter: Secondary | ICD-10-CM | POA: Diagnosis not present

## 2020-07-15 NOTE — Patient Instructions (Signed)
Tick Bite Information, Adult Ticks are insects that draw blood for food. Most ticks live in shrubs and grassy and wooded areas. They climb onto people and animals that brush against the leaves and grasses that they rest on. Then they bite, attaching themselves to the skin. Most ticks are harmless, but some ticks may carry germs that can spread to a person through a bite and cause a disease. To reduce your risk of getting a disease from a tick bite, make sure you:  Take steps to prevent tick bites.  Check for ticks after being outdoors where ticks live.  Watch for symptoms of disease if a tick attached to you or if you suspect a tick bite. How can I prevent tick bites? Take these steps to help prevent tick bites when you go outdoors in an area where ticks live: Use insect repellent  Use insect repellent that has DEET (20% or higher), picaridin, or IR3535 in it. Follow the instructions on the label. Use these products on: ? Bare skin. ? The top of your boots. ? Your pant legs. ? Your sleeve cuffs.  For insect repellent that contains permethrin, follow the instructions on the label. Use these products on: ? Clothing. ? Boots. ? Outdoor gear. ? Tents. When you are outside  Wear protective clothing. Long sleeves and long pants offer the best protection from ticks.  Wear light-colored clothing so you can see ticks more easily.  Tuck your pant legs into your socks.  If you go walking on a trail, stay in the middle of the trail so your skin, hair, and clothing do not touch the bushes.  Avoid walking through areas with long grass.  Check for ticks on your clothing, hair, and skin often while you are outside, and check again before you go inside. Make sure to check the scalp, neck, armpits, waist, groin, and joint areas. These are the spots where ticks attach themselves most often. When you go indoors  Check your clothing for ticks. Tumble dry clothes in a dryer on high heat for at least  10 minutes. If clothes are damp, additional time may be needed. If clothes require washing, use hot water.  Examine gear and pets.  Shower soon after being outdoors.  Check your body for ticks. Conduct a full body check using a mirror. What is the proper way to remove a tick? If you find a tick on your body, remove it as soon as possible. Removing a tick sooner can prevent germs from passing to your body. Do not remove the tick with your bare fingers. To remove a tick that is crawling on your skin but has not bitten, use either of these methods:  Go outdoors and brush the tick off.  Remove the tick with tape or a lint roller. To remove a tick that is attached to your skin: 1. Wash your hands. If you have latex gloves, put them on. 2. Use fine-tipped tweezers, curved forceps, or a tick-removal tool to gently grasp the tick as close to your skin and the tick's head as possible. 3. Gently pull with a steady, upward, even pressure until the tick lets go. 4. When removing the tick: ? Take care to keep the tick's head attached to its body. ? Do not twist or jerk the tick. This can make the tick's head or mouth parts break off and remain in the skin. ? Do not squeeze or crush the tick's body. This could force disease-carrying fluids from the tick   into your body. Do not try to remove a tick with heat, alcohol, petroleum jelly, or fingernail polish. Using these methods can cause the tick to salivate and regurgitate into your bloodstream, increasing your risk of getting a disease.   What should I do after removing a tick?  Dispose of the tick. Do not crush a tick with your fingers.  Clean the bite area and your hands with soap and water, rubbing alcohol, or an iodine scrub.  If an antiseptic cream or ointment is available, apply a small amount to the bite site.  Wash and disinfect any instruments that you used to remove the tick. How should I dispose of a tick? To dispose of a live tick, use  one of these methods:  Place it in rubbing alcohol.  Place it in a sealed bag or container.  Wrap it tightly in tape.  Flush it down the toilet. Contact a health care provider if:  You have symptoms of a disease after a tick bite. Symptoms of a tick-borne disease can occur from moments after the tick bites to 30 days after a tick is removed. Symptoms include: ? Fever or chills. ? Any of these signs in the bite area:  A red rash that makes a circle (bull's-eye rash) in the bite area.  Redness and swelling. ? Headache. ? Muscle, joint, or bone pain. ? Abnormal tiredness. ? Numbness in your legs or difficulty walking or moving your legs. ? Tender, swollen lymph glands.  A part of a tick breaks off and gets stuck in your skin. Get help right away if:  You are not able to remove a tick.  You experience muscle weakness or paralysis.  Your symptoms get worse or you experience new symptoms.  You find an engorged tick on your skin and you are in an area where disease from ticks is a high risk. Summary  Ticks may carry germs that can spread to a person through a bite and cause a disease.  Wear protective clothing and use insect repellent to prevent tick bites. Follow the instructions on the label.  If you find a tick on your body, remove it as soon as possible. If the tick is attached, do not try to remove with heat, alcohol, petroleum jelly, or fingernail polish.  Remove the attached tick using fine-tipped tweezers, curved forceps, or a tick-removal tool. Gently pull with steady, upward, even pressure until the tick lets go. Do not twist or jerk the tick. Do not squeeze or crush the tick's body.  If you have symptoms of a disease after being bitten by a tick, contact a health care provider. This information is not intended to replace advice given to you by your health care provider. Make sure you discuss any questions you have with your health care provider. Document Revised:  03/05/2019 Document Reviewed: 03/05/2019 Elsevier Patient Education  2021 [employer]  

## 2020-07-15 NOTE — Progress Notes (Signed)
Established Patient Office Visit  Subjective:  Patient ID: Amy Hooper, female    DOB: October 13, 1950  Age: 70 y.o. MRN: [id number]  CC:  Chief Complaint  Patient presents with  . Tick Removal    Tick bite, found the tick last night, on L side, itching and temp of 99 this am    HPI Amy Hooper presents for tick bite.  She noticed this last night.  This was somewhat engorged and possibly a [place] tick.  She noticed there was something white on the back.  She is not sure exactly how long this was bitten in.  She initially thought this was some type of mole.  She had some itching.  No fever.  No headaches.  No myalgias or arthralgias.  No skin rash other than some local redness.  She does have a pet cat and thinks that she may have gotten the tick from the cat.  She does not spend a lot of time outdoors.  Past Medical History:  Diagnosis Date  . Chronic kidney disease   . Depression   . Fibromyalgia   . Gout   . HTN (hypertension)   . Insomnia   . Morbid obesity ([place])   . Osteoarthritis   . Palpitation     Past Surgical History:  Procedure Laterality Date  . KNEE ARTHROSCOPY    . right rotator cuff    . ROTATOR CUFF REPAIR Left   . TUBAL LIGATION      Family History  Problem Relation Age of Onset  . Hypothyroidism Mother   . Hyperlipidemia Other   . Kidney failure Father   . Dementia Father   . Gout Father   . Arthritis Father   . Prostate cancer Father     Social History   Socioeconomic History  . Marital status: Married    Spouse name: Not on file  . Number of children: 3  . Years of education: Not on file  . Highest education level: Not on file  Occupational History  . 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 Smoking status: Never Smoker  . Smokeless tobacco: Never Used  Vaping Use  . Vaping Use: Never used  Substance and Sexual Activity  . Alcohol use: Never    Alcohol/week: 0.0 standard drinks  . Drug use: Never  . Sexual activity:  Never  Other Topics Concern  . Not on file  Social History Narrative   Lives gives with fiance.     Social Determinants of Health   Financial Resource Strain: Not on file  Food Insecurity: Not on file  Transportation Needs: Not on file  Physical Activity: Not on file  Stress: Not on file  Social Connections: Not on file  Intimate Partner Violence: Not on file    Outpatient Medications Prior to Visit  Medication Sig Dispense Refill  . acetaminophen (TYLENOL) 500 MG tablet Take 500 mg by mouth every 6 (six) hours as needed.    Marland Kitchen allopurinol (ZYLOPRIM) 100 MG tablet Take 2 tablets by mouth daily.    Marland Kitchen amLODipine (NORVASC) 5 MG tablet Take 1 tablet (5 mg total) by mouth daily. 90 tablet 1  . atorvastatin (LIPITOR) 10 MG tablet Take 1 tablet (10 mg total) by mouth at bedtime. 90 tablet 1  . diclofenac Sodium (VOLTAREN) 1 % GEL Apply topically 4 (four) times daily.    . DULoxetine (CYMBALTA) 30 MG capsule TAKE 1 CAPSULE BY MOUTH EVERY DAY 90 capsule 1  .  eszopiclone (LUNESTA) 2 MG TABS tablet TAKE 1 TABLET BY MOUTH AT NIGHT IMMEDIATELY BEFORE BEDTIME 30 tablet 2  . fluticasone (FLONASE) 50 MCG/ACT nasal spray Place into both nostrils daily.    Marland Kitchen loratadine (CLARITIN) 10 MG tablet Take 10 mg by mouth daily.    Marland Kitchen losartan (COZAAR) 50 MG tablet Take 1 tablet (50 mg total) by mouth daily. 90 tablet 1  . Molnupiravir 200 MG CAPS TAKE 4 CAPSULES BY MOUTH IN THE MORNING AND AT BEDTIME FOR 5 DAYS 40 capsule 0  . Multiple Vitamins-Minerals (CENTRUM SILVER PO) Take 1 tablet by mouth daily.    . pregabalin (LYRICA) 75 MG capsule Take 1 capsule (75 mg total) by mouth 3 (three) times daily. 90 capsule 1   No facility-administered medications prior to visit.    Allergies  Allergen Reactions  . Cefuroxime Axetil Rash and Hives  . Clarithromycin Rash and Hives  . Nsaids Nausea And Vomiting  . Percocet [Oxycodone-Acetaminophen] Nausea And Vomiting    Pt. Also states it makes her hallucinate  .  Hydrocodone-Acetaminophen Other (See Comments)    Stomach upset  . Morphine   . Other Other (See Comments)    Upset stomach  . Oxycodone-Acetaminophen Other (See Comments)    Hallucination  . Tramadol Nausea And Vomiting and Other (See Comments)    Upset stomach    ROS Review of Systems  Constitutional: Negative for chills and fever.  Musculoskeletal: Negative for arthralgias and myalgias.  Neurological: Negative for headaches.      Objective:    Physical Exam Vitals reviewed.  Constitutional:      Appearance: Normal appearance.  Cardiovascular:     Rate and Rhythm: Normal rate and regular rhythm.  Skin:    Comments: Left waist area reveals very small area of erythema about 1 x 1 cm.  Nontender.  No pustule.  No evidence for any retained tick parts.  Nonfluctuant.  Neurological:     Mental Status: She is alert.     BP 140/64 (BP Location: Left Arm, Patient Position: Sitting, Cuff Size: Normal)   Pulse 79   Temp 98.2 F (36.8 C) (Oral)   Wt 213 lb 4.8 oz (96.8 kg)   SpO2 98%   BMI 39.01 kg/m  Wt Readings from Last 3 Encounters:  07/15/20 213 lb 4.8 oz (96.8 kg)  05/23/20 207 lb 4.8 oz (94 kg)  05/19/20 207 lb (93.9 kg)     Health Maintenance Due  Topic Date Due  . Hepatitis C Screening  Never done    There are no preventive care reminders to display for this patient.  Lab Results  Component Value Date   TSH 2.57 02/05/2020   Lab Results  Component Value Date   WBC 5.6 02/05/2020   HGB 11.7 02/05/2020   HCT 35.9 02/05/2020   MCV 90.0 02/05/2020   PLT 344 02/05/2020   Lab Results  Component Value Date   NA 144 02/05/2020   K 4.2 02/05/2020   CO2 28 02/05/2020   GLUCOSE 78 02/05/2020   BUN 17 02/05/2020   CREATININE 1.17 (H) 02/05/2020   BILITOT 0.4 02/05/2020   AST 13 02/05/2020   ALT 10 02/05/2020   PROT 6.6 02/05/2020   CALCIUM 9.7 02/05/2020   Lab Results  Component Value Date   CHOL 233 (H) 01/08/2020   Lab Results  Component  Value Date   HDL 72 01/08/2020   Lab Results  Component Value Date   LDLCALC 145 (H) 01/08/2020  Lab Results  Component Value Date   TRIG 67 01/08/2020   Lab Results  Component Value Date   CHOLHDL 3.2 01/08/2020   No results found for: HGBA1C    Assessment & Plan:   Problem List Items Addressed This Visit   None   Visit Diagnoses    Tick bite, unspecified site, initial encounter    -  Primary    Patient has evidence currently for some mild local allergic irritation from tick bite.  This sounds from description like probably [place] tick.  -We did review signs and symptoms of tick related illness especially [place] spotted fever.  It sounds like this was clearly not a deer tick but we also described things to look out for with Lyme disease. -Observe for now.  Follow-up promptly for any fever, severe headache, concerning rash, or other concerns  No orders of the defined types were placed in this encounter.   Follow-up: No follow-ups on file.    Carolann Littler, MD

## 2020-07-17 ENCOUNTER — Telehealth: Payer: Self-pay | Admitting: *Deleted

## 2020-07-17 ENCOUNTER — Other Ambulatory Visit: Payer: Self-pay

## 2020-07-17 ENCOUNTER — Encounter
Payer: No Typology Code available for payment source | Attending: Physical Medicine & Rehabilitation | Admitting: Physical Medicine & Rehabilitation

## 2020-07-17 ENCOUNTER — Encounter: Payer: Self-pay | Admitting: Physical Medicine & Rehabilitation

## 2020-07-17 VITALS — BP 146/86 | HR 69 | Temp 98.5°F | Ht 62.0 in | Wt 211.4 lb

## 2020-07-17 DIAGNOSIS — M47816 Spondylosis without myelopathy or radiculopathy, lumbar region: Secondary | ICD-10-CM

## 2020-07-17 DIAGNOSIS — G4733 Obstructive sleep apnea (adult) (pediatric): Secondary | ICD-10-CM | POA: Diagnosis present

## 2020-07-17 DIAGNOSIS — M791 Myalgia, unspecified site: Secondary | ICD-10-CM

## 2020-07-17 DIAGNOSIS — M797 Fibromyalgia: Secondary | ICD-10-CM

## 2020-07-17 DIAGNOSIS — G894 Chronic pain syndrome: Secondary | ICD-10-CM

## 2020-07-17 MED ORDER — PREGABALIN 75 MG PO CAPS: 75.0000 mg | ORAL_CAPSULE | Freq: Three times a day (TID) | ORAL | 5 refills | Status: DC

## 2020-07-17 NOTE — Progress Notes (Signed)
Subjective:    Patient ID: Amy Hooper, female    DOB: [birth year], 70 y.o.   MRN: [id number]  HPI Female with past medical history/past surgical history of OSA, morbid obesity, hypertension, fibromyalgia, depression, gout, CKD, degenerative disc disease-lumbar, bilateral knee surgeries for torn mensici, bilateral shoulder surgeries for rotator cuff presents with bilateral L > R knee pain.  Initially stated: Started ~1990. After a fall on her knees.  Progressively getting worse.  Steroid injections improve the pain along with brace.  Ambulation exacerbates the pain.  Moving after prolonged postures exacerbates the pain.  Achy.  Radiates down leg.  Intermittent. Denies associated weakness, numbness.  Tylenol, NSAIDs do not help, except for Celebrex, but stopped due to CKD.  1 fall summer of 2021 falling backward in chair.  Pain limits ambulation.  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 She has had bilateral L4-5 lumbar epidural steroid injections.  She has also had facet injections.  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  Irritation of S1 nerve roots.  She had a left knee steroid injection on 03/18/2020 with Ortho.  She is seen rheumatology as well.  No reviewed from rheumatology, orthopedic surgeon, physiatrist, neurology-plan for knee replacement in December 2022. She does note that she is improving with weight loss and dietary changes.   Last clinic visit on 05/19/20.  She saw pain management since that time and is scheduled for MBB.  Since last visit, she states she has had great improvement with pool therapy. She states she is hungry. She had good benefits with Lyrica. She has not seen Nephro yet. She is awaiting CPAP.  Pain Inventory Average Pain 7 Pain Right Now 3 My pain is aching  In the last 24 hours, has pain interfered with the  following? General activity 5 Relation with others 5 Enjoyment of life 5 What TIME of day is your pain at its worst? evening and night Sleep (in general) Fair  Pain is worse with: walking, bending, sitting and standing Pain improves with: rest, pacing activities, medication and injections Relief from Meds: 7   Family History  Problem Relation Age of Onset  . Hypothyroidism Mother   . Hyperlipidemia Other   . Kidney failure Father   . Dementia Father   . Gout Father   . Arthritis Father   . Prostate cancer Father    Social History   Socioeconomic History  . Marital status: Married    Spouse name: Not on file  . Number of children: 3  . Years of education: Not on file  . Highest education level: Not on file  Occupational History  . 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 Smoking status: Never Smoker  . Smokeless tobacco: Never Used  Vaping Use  . Vaping Use: Never used  Substance and Sexual Activity  . Alcohol use: Never    Alcohol/week: 0.0 standard drinks  . Drug use: Never  . Sexual activity: Never  Other Topics Concern  . Not on file  Social History Narrative   Lives gives with fiance.     Social Determinants of Health   Financial Resource Strain: Not on file  Food Insecurity: Not on file  Transportation Needs: Not on file  Physical Activity: Not on file  Stress: Not on file  Social Connections: Not on file   Past Surgical History:  Procedure Laterality Date  . KNEE ARTHROSCOPY    . right rotator cuff    . ROTATOR CUFF REPAIR Left   . TUBAL LIGATION     Past Medical History:  Diagnosis Date  . Chronic kidney disease   . Depression   . Fibromyalgia   . Gout   . HTN (hypertension)   . Insomnia   . Morbid obesity ([place])   . Osteoarthritis   . Palpitation    BP (!) 146/86   Pulse 69   Temp 98.5 F (36.9 C)   Ht 5\' 2"  (1.575 m)   Wt 211 lb 6.4 oz (95.9 kg)   SpO2 96%   BMI 38.67 kg/m   Opioid Risk Score:   Fall Risk Score:  `1  Depression  screen PHQ 2/9  Depression screen [facility] 2/9 04/22/2020 04/17/2020 01/08/2020  Decreased Interest 0 0 0  Down, Depressed, Hopeless 0 0 0  PHQ - 2 Score 0 0 0  Altered sleeping 1 3 0  Tired, decreased energy 0 0 0  Change in appetite 0 0 0  Feeling bad or failure about yourself  0 0 0  Trouble concentrating 0 0 0  Moving slowly or fidgety/restless 0 0 0  Suicidal thoughts 0 0 0  PHQ-9 Score 1 3 0  Difficult doing work/chores Not difficult at all Not difficult at all Not difficult at all   Review of Systems  Constitutional: Negative.   HENT: Negative.   Eyes: Negative.   Respiratory: Negative.   Cardiovascular: Negative.   Gastrointestinal: Negative.   Endocrine: Negative.   Genitourinary: Negative.   Musculoskeletal: Positive for arthralgias, back pain, gait problem, joint swelling, myalgias, neck pain and neck stiffness.       Arm pain Leg pain  Allergic/Immunologic: Negative.   Hematological: Negative.   Psychiatric/Behavioral: Positive for sleep disturbance.  All other systems reviewed and are negative.     Objective:   Physical Exam  Constitutional: No distress . Vital signs reviewed. HENT: Normocephalic.  Atraumatic. Eyes: EOMI. No discharge. Cardiovascular: No JVD.   Respiratory: Normal effort.  No stridor.   GI: Non-distended.   Skin: Warm and dry.  Intact. Psych: Normal mood.  Normal behavior. Musc:  Scattered points of tenderness, improving Neuro: Alert Motor: 5/5 throughout b/l LE Sensation intact to light touch    Assessment & Plan:  Female with past medical history/past surgical history of OSA, morbid obesity, hypertension, fibromyalgia, depression, gout, CKD, degenerative disc disease-lumbar, bilateral knee surgeries for torn mensici, bilateral shoulder surgeries for rotator cuff presents with bilateral L > R knee pain.   1. Bilateral knee OA - endstage  Patient was supposed to have knee replacements last year, but due to insurance changed to summer  2022  Endstage OA in b/l knee per Ortho, no films available  No benefit with Heat/Cold, PT (~2019), Robaxin, Flexaril, Tizanidine  Continue bracing prn (limit on right knee - educated)  Continue Voltaren gel  Lidocaine patch OTC, does not want to try, wanted to focus on generalized pain  Continue Cymbalta   Patient states main goal is to ambulate  Wants to buy a pool - afraid of public locations due to [place]  Recommended follow up with Nephro regarding Chondroitin sulfate, appointment next month  Tolerating mild exercise - limited by back   Continue antiinflammatory diet  2. Sleep disturbance  Lunesta per PCP  Continue to await CPAP  3. Morbid Obesity  Continue follow up with dietitian  Encouraged weight loss  4. Myalgia   Will consider trigger point injections  5. Fibromyalgia  See #1  Continue aquatic therapy - great benefit  Encouraged ROM, stretching  Continue Cymbalta  Continue Lyrica 75 TID

## 2020-07-17 NOTE — Telephone Encounter (Signed)
Patient left a message stating she was in this morning and is asking for a call back to discuss her medications.  I contacted the patient for clarification. She says she had a conversation with Dr. Posey Pronto regarding Lyrica and increased appetite. She says that decreasing medication was discussed.  She states she thinks she would like to try decrease. She was thinking lyrica 50 mg TID    I discussed this with Dr. Posey Pronto. He states that he discussed that her exercises could be a contributing factor.  He suggested that patient switch to BID to see if it helps rather than changing script.  Contacted patient and informed.  Patient agrees with plan

## 2020-07-22 ENCOUNTER — Ambulatory Visit (HOSPITAL_BASED_OUTPATIENT_CLINIC_OR_DEPARTMENT_OTHER): Payer: No Typology Code available for payment source | Admitting: Physical Therapy

## 2020-07-23 ENCOUNTER — Encounter: Payer: Self-pay | Admitting: Physical Medicine and Rehabilitation

## 2020-07-25 ENCOUNTER — Encounter: Payer: Self-pay | Admitting: Orthopaedic Surgery

## 2020-07-27 NOTE — Telephone Encounter (Signed)
Does she want gel or cortisone?

## 2020-07-28 NOTE — Telephone Encounter (Signed)
Yeah i'm happy to inject either one.  Just let her know we can do either one.  Just have her make an appt for it.  Thanks.

## 2020-07-29 ENCOUNTER — Other Ambulatory Visit: Payer: Self-pay

## 2020-07-29 ENCOUNTER — Ambulatory Visit (HOSPITAL_BASED_OUTPATIENT_CLINIC_OR_DEPARTMENT_OTHER): Payer: No Typology Code available for payment source | Admitting: Physical Therapy

## 2020-07-29 ENCOUNTER — Telehealth: Payer: Self-pay

## 2020-07-29 ENCOUNTER — Encounter (HOSPITAL_BASED_OUTPATIENT_CLINIC_OR_DEPARTMENT_OTHER): Payer: Self-pay | Admitting: Physical Therapy

## 2020-07-29 ENCOUNTER — Telehealth: Payer: Self-pay | Admitting: Physical Medicine and Rehabilitation

## 2020-07-29 DIAGNOSIS — M6281 Muscle weakness (generalized): Secondary | ICD-10-CM

## 2020-07-29 DIAGNOSIS — M79604 Pain in right leg: Secondary | ICD-10-CM

## 2020-07-29 DIAGNOSIS — R600 Localized edema: Secondary | ICD-10-CM | POA: Insufficient documentation

## 2020-07-29 DIAGNOSIS — M79605 Pain in left leg: Secondary | ICD-10-CM | POA: Insufficient documentation

## 2020-07-29 DIAGNOSIS — M797 Fibromyalgia: Secondary | ICD-10-CM | POA: Insufficient documentation

## 2020-07-29 DIAGNOSIS — M25511 Pain in right shoulder: Secondary | ICD-10-CM | POA: Insufficient documentation

## 2020-07-29 DIAGNOSIS — G8929 Other chronic pain: Secondary | ICD-10-CM

## 2020-07-29 DIAGNOSIS — R293 Abnormal posture: Secondary | ICD-10-CM | POA: Insufficient documentation

## 2020-07-29 NOTE — Telephone Encounter (Signed)
Patient called. She would like an appointment with Dr. Ernestina Patches. Her call back number is [phone]

## 2020-07-29 NOTE — Telephone Encounter (Signed)
Noted  

## 2020-07-29 NOTE — Telephone Encounter (Signed)
Submitted VOB for Monovisc, right knee. Pending BV 

## 2020-07-29 NOTE — Therapy (Signed)
[place], [place], [address] Phone: [phone]   Fax:  [phone]  Physical Therapy Treatment  Patient Details  Name: Amy Hooper MRN: [id number] Date of Birth: 1950/12/28 Referring Provider (PT): Jamse Arn, MD   Encounter Date: 07/29/2020   PT End of Session - 07/29/20 2120    Visit Number 7    Number of Visits 17    Date for PT Re-Evaluation 07/25/20    Authorization Type PHCS multiplan    PT Start Time 1626   Patient 26 minutes late   PT Stop Time 1645    PT Time Calculation (min) 19 min    Activity Tolerance Patient tolerated treatment well    Behavior During Therapy [facility] for tasks assessed/performed           Past Medical History:  Diagnosis Date  . Chronic kidney disease   . Depression   . Fibromyalgia   . Gout   . HTN (hypertension)   . Insomnia   . Morbid obesity ([place])   . Osteoarthritis   . Palpitation     Past Surgical History:  Procedure Laterality Date  . KNEE ARTHROSCOPY    . right rotator cuff    . ROTATOR CUFF REPAIR Left   . TUBAL LIGATION      There were no vitals filed for this visit.   Subjective Assessment - 07/29/20 1628    Subjective Pateitn reports her right knee has been clicking. She will have an injection next week. Her left knee will be being replaced.    Pertinent History Fibromyalgia, OA, DDD, gets regular steroid shots in L knee and R hip    How long can you sit comfortably? Aggravates back; difficulty getting up    How long can you stand comfortably? n/a    How long can you walk comfortably? Does not walk too much due to L knee pain    Patient Stated Goals Increase movement, decrease pain    Currently in Pain? Yes    Pain Score 7     Pain Location Back    Pain Orientation Right;Left    Pain Descriptors / Indicators Aching    Pain Type Chronic pain    Pain Onset More than a month ago    Pain Frequency Constant               [facility] PT Assessment - 07/29/20 0001      Strength   Right Hip Flexion 4+/5    Right Hip ABduction 4+/5    Left Hip Flexion 4+/5    Left Hip ABduction 4+/5    Right Knee Flexion 5/5    Right Knee Extension 4+/5    Left Knee Flexion 5/5    Left Knee Extension 4+/5                                 PT Education - 07/29/20 2120    Education Details HEP and symptom mangement    Person(s) Educated Patient    Methods Explanation;Demonstration;Tactile cues;Verbal cues    Comprehension Verbalized understanding;Returned demonstration;Verbal cues required;Tactile cues required            PT Short Term Goals - 07/01/20 1643      PT SHORT TERM GOAL #1   Title Pt will be independent with initial HEP    Time 4    Period Weeks  Status Achieved    Target Date 06/27/20      PT SHORT TERM GOAL #2   Title Pt will be able to perform 5x STS without using UEs in <20 sec    Baseline Currently needs use of UEs to come to full standing; 24 sec without UEs    Time 4    Period Weeks    Status On-going    Target Date 06/27/20      PT SHORT TERM GOAL #3   Title Pt will be able to stand on R LE for at least 8 sec to demo improved stability    Baseline 4 sec on R; 25 sec on R    Time 4    Period Weeks    Status Achieved    Target Date 06/27/20             PT Long Term Goals - 05/30/20 1128      PT LONG TERM GOAL #1   Title Pt will report decrease in general pain by 50%    Time 8    Period Weeks    Status New    Target Date 07/25/20      PT LONG TERM GOAL #2   Title Pt will be able to self manage symptoms with exercises and stretches independently    Time 8    Period Weeks    Status New    Target Date 07/25/20      PT LONG TERM GOAL #3   Title Pt will demo 5x STS <13 sec for improved functional strength    Time 8    Period Weeks    Status New    Target Date 07/25/20                 Plan - 07/29/20 2122    Clinical Impression  Statement Patient was 26 minutes late for her appointment. She reports pain in her back and knees, but she has been managing at home. She will be having a knee surgery in Jun. She is unsure what her insurance year is. She would like to save her appointements if she will be limited. If she is not she would like to continue to strengthen and work on her back. She will check with her insurance company how many ciaits she has used.    Personal Factors and Comorbidities Age;Comorbidity 1;Comorbidity 3+;Comorbidity 2;Past/Current Experience;Profession;Time since onset of injury/illness/exacerbation    Examination-Activity Limitations Sit;Stand;Transfers;Locomotion Level;Sleep    Examination-Participation Restrictions Community Activity;Driving;Occupation    Stability/Clinical Decision Making Evolving/Moderate complexity    Clinical Decision Making Moderate    Rehab Potential Good    PT Frequency 2x / week    PT Duration 8 weeks    PT Treatment/Interventions Aquatic Therapy;ADLs/Self Care Home Management;Electrical Stimulation;Traction;Moist Heat;Iontophoresis 4mg /ml Dexamethasone;Gait training;Stair training;Functional mobility training;Therapeutic activities;Therapeutic exercise;Balance training;Neuromuscular re-education;Patient/family education;Manual techniques;Passive range of motion;Dry needling;Taping;Vasopneumatic Device    PT Next Visit Plan Assess response to HEP. Consider manual therapy and dry needling for pt's neck and lumbar/glutes. Continue stretches as needed. Continue gentle strengthening for quads, hips, and core.    PT Home Exercise Plan Access Code [email or web address]  aquatics [email or web address]    Consulted and Agree with Plan of Care Patient           Patient will benefit from skilled therapeutic intervention in order to improve the following deficits and impairments:  Decreased endurance,Hypomobility,Decreased activity tolerance,Decreased strength,Increased fascial restricitons,Pain,Decreased  balance,Decreased mobility,Difficulty walking,Decreased range of motion,Impaired flexibility,Postural dysfunction  Visit Diagnosis: Muscle  weakness (generalized)  Abnormal posture  Pain in both lower extremities  Fibromyalgia  Chronic right shoulder pain  Localized edema     Problem List Patient Active Problem List   Diagnosis Date Noted  . Spondylosis without myelopathy or radiculopathy, lumbar region 07/17/2020  . Chronic pain syndrome 07/17/2020  . Myalgia 07/17/2020  . OSA (obstructive sleep apnea) 05/19/2020  . 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/17/2020  . Sleep disturbance 04/17/2020  . Chronic pain of both shoulders 03/03/2020  . Pain in both hands 03/03/2020  . Primary osteoarthritis of both knees 03/03/2020  . Pain in both feet 03/03/2020  . History of chronic kidney disease 03/03/2020  . DDD (degenerative disc disease), lumbar 02/05/2020  . Vitamin D deficiency 01/09/2020  . Hyperlipidemia 01/09/2020  . CKD (chronic kidney disease) stage 3, GFR 30-59 ml/min (HCC) 01/09/2020  . HTN (hypertension)   . Morbid obesity ([place])   . Gout   . Fibromyalgia   . Depression   . Insomnia     Carney Living PT DPT  07/29/2020, 9:26 PM  [facility] [address] [place], [place], [address] Phone: [phone]   Fax:  [phone]  Name: Amy Hooper MRN: [id number] Date of Birth: 01-17-1951

## 2020-07-30 NOTE — Telephone Encounter (Signed)
Called patient to advise that, per MyChart message, [employer] is working on authorization for injections.

## 2020-07-31 ENCOUNTER — Telehealth: Payer: Self-pay

## 2020-07-31 NOTE — Telephone Encounter (Signed)
Talked with patient and advised her that her insurance does not cover Monovisc.  Patient stated that she is covered under her husband's insurance, [employer].  Will submit with new insurance.

## 2020-07-31 NOTE — Telephone Encounter (Signed)
VOB has been submitted for SynviscOne, right knee. Pending BV.

## 2020-08-01 ENCOUNTER — Telehealth: Payer: Self-pay

## 2020-08-01 NOTE — Telephone Encounter (Signed)
Called and left a VM advising patient that gel injection is not covered under medical plan and to give me a call to discuss.

## 2020-08-05 ENCOUNTER — Ambulatory Visit (INDEPENDENT_AMBULATORY_CARE_PROVIDER_SITE_OTHER): Payer: No Typology Code available for payment source | Admitting: Orthopaedic Surgery

## 2020-08-05 ENCOUNTER — Other Ambulatory Visit: Payer: Self-pay

## 2020-08-05 ENCOUNTER — Telehealth: Payer: Self-pay | Admitting: Orthopaedic Surgery

## 2020-08-05 DIAGNOSIS — M1711 Unilateral primary osteoarthritis, right knee: Secondary | ICD-10-CM | POA: Diagnosis not present

## 2020-08-05 MED ORDER — BUPIVACAINE HCL 0.5 % IJ SOLN
2.0000 mL | INTRAMUSCULAR | Status: AC | PRN
Start: 2020-08-05 — End: 2020-08-05
  Administered 2020-08-05: 2 mL via INTRA_ARTICULAR

## 2020-08-05 MED ORDER — LIDOCAINE HCL 1 % IJ SOLN
2.0000 mL | INTRAMUSCULAR | Status: AC | PRN
  Administered 2020-08-05: 2 mL

## 2020-08-05 MED ORDER — METHYLPREDNISOLONE ACETATE 40 MG/ML IJ SUSP
40.0000 mg | INTRAMUSCULAR | Status: AC | PRN
  Administered 2020-08-05: 40 mg via INTRA_ARTICULAR

## 2020-08-05 NOTE — Progress Notes (Signed)
Office Visit Note   Patient: Margie Urbanowicz           Date of Birth: 1950-10-12           MRN: [id number] Visit Date: 08/05/2020              Requested by: Isaac Bliss, [name], MD [place],  [place] [address] PCP: Isaac Bliss, [name], MD   Assessment & Plan: Visit Diagnoses:  1. Primary osteoarthritis of right knee     Plan: Ms. Holzheimer underwent right knee cortisone injection today without any problems.  We look forward to treating her in the operating room for the left knee replacement in the near future.  Follow-Up Instructions: Return if symptoms worsen or fail to improve.   Orders:  No orders of the defined types were placed in this encounter.  No orders of the defined types were placed in this encounter.     Procedures: Large Joint Inj: R knee on 08/05/2020 6:13 PM Indications: pain Details: 22 G needle  Arthrogram: No  Medications: 40 mg methylPREDNISolone acetate 40 MG/ML; 2 mL lidocaine 1 %; 2 mL bupivacaine 0.5 % Consent was given by the patient. Patient was prepped and draped in the usual sterile fashion.       Clinical Data: No additional findings.   Subjective: Chief Complaint  Patient presents with  . Right Knee - Pain    Ms. Mcandrew is here for follow-up of right knee DJD.  Requesting a cortisone injection today.  Her left knee replacement is scheduled for June.   Review of Systems   Objective: Vital Signs: There were no vitals taken for this visit.  Physical Exam Right knee exam is unchanged. Ortho Exam  Specialty Comments:  No specialty comments available.  Imaging: No results found.   PMFS History: Patient Active Problem List   Diagnosis Date Noted  . Spondylosis without myelopathy or radiculopathy, lumbar region 07/17/2020  . Chronic pain syndrome 07/17/2020  . Myalgia 07/17/2020  . OSA (obstructive sleep apnea) 05/19/2020  . 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/17/2020   . Sleep disturbance 04/17/2020  . Chronic pain of both shoulders 03/03/2020  . Pain in both hands 03/03/2020  . Primary osteoarthritis of both knees 03/03/2020  . Pain in both feet 03/03/2020  . History of chronic kidney disease 03/03/2020  . DDD (degenerative disc disease), lumbar 02/05/2020  . Vitamin D deficiency 01/09/2020  . Hyperlipidemia 01/09/2020  . CKD (chronic kidney disease) stage 3, GFR 30-59 ml/min (HCC) 01/09/2020  . HTN (hypertension)   . Morbid obesity ([place])   . Gout   . Fibromyalgia   . Depression   . Insomnia    Past Medical History:  Diagnosis Date  . Chronic kidney disease   . Depression   . Fibromyalgia   . Gout   . HTN (hypertension)   . Insomnia   . Morbid obesity ([place])   . Osteoarthritis   . Palpitation     Family History  Problem Relation Age of Onset  . Hypothyroidism Mother   . Hyperlipidemia Other   . Kidney failure Father   . Dementia Father   . Gout Father   . Arthritis Father   . Prostate cancer Father     Past Surgical History:  Procedure Laterality Date  . KNEE ARTHROSCOPY    . right rotator cuff    . ROTATOR CUFF REPAIR Left   . TUBAL LIGATION     Social History  Occupational History  . 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 Smoking status: Never Smoker  . Smokeless tobacco: Never Used  Vaping Use  . Vaping Use: Never used  Substance and Sexual Activity  . Alcohol use: Never    Alcohol/week: 0.0 standard drinks  . Drug use: Never  . Sexual activity: Never

## 2020-08-05 NOTE — Telephone Encounter (Signed)
Received medical records release form,$25.00 check and disability/FMLA form from patient     Forwarding to [facility] today

## 2020-08-06 ENCOUNTER — Telehealth: Payer: Self-pay | Admitting: Physical Medicine and Rehabilitation

## 2020-08-06 NOTE — Telephone Encounter (Signed)
Pt called stating Amy Hooper offered her an appt at 10:30 this morning and the pt thought she had responded but realized she hadn't.Pt would like to know if the appt is still available? She would like a CB to either confirm she can come in today or get another day scheduled.   [phone]

## 2020-08-07 NOTE — Telephone Encounter (Signed)
Scheduled for MyChart video visit.

## 2020-08-11 ENCOUNTER — Telehealth: Payer: Self-pay | Admitting: Orthopaedic Surgery

## 2020-08-11 NOTE — Telephone Encounter (Signed)
Received medical records release form,disability form and $25.00 check from patient    Forwarding to [employer] today

## 2020-08-15 ENCOUNTER — Other Ambulatory Visit: Payer: Self-pay

## 2020-08-19 ENCOUNTER — Telehealth: Payer: Self-pay | Admitting: Physical Medicine and Rehabilitation

## 2020-08-19 ENCOUNTER — Telehealth (INDEPENDENT_AMBULATORY_CARE_PROVIDER_SITE_OTHER): Payer: No Typology Code available for payment source | Admitting: Physical Medicine and Rehabilitation

## 2020-08-19 ENCOUNTER — Encounter: Payer: Self-pay | Admitting: Physical Medicine and Rehabilitation

## 2020-08-19 DIAGNOSIS — M47816 Spondylosis without myelopathy or radiculopathy, lumbar region: Secondary | ICD-10-CM

## 2020-08-19 DIAGNOSIS — M48062 Spinal stenosis, lumbar region with neurogenic claudication: Secondary | ICD-10-CM | POA: Diagnosis not present

## 2020-08-19 DIAGNOSIS — M5441 Lumbago with sciatica, right side: Secondary | ICD-10-CM

## 2020-08-19 DIAGNOSIS — M5442 Lumbago with sciatica, left side: Secondary | ICD-10-CM

## 2020-08-19 DIAGNOSIS — G8929 Other chronic pain: Secondary | ICD-10-CM

## 2020-08-19 DIAGNOSIS — M5416 Radiculopathy, lumbar region: Secondary | ICD-10-CM | POA: Diagnosis not present

## 2020-08-19 NOTE — Telephone Encounter (Signed)
Patient is scheduled for a left TKA with Dr. Erlinda Hong on 6/20. Is it ok to schedule an ESI prior to that? If so, how long before surgery does this need to be performed?

## 2020-08-19 NOTE — Progress Notes (Signed)
[name] - 70 y.o. female MRN [id number]  Date of birth: 07-12-1950  Virtual Visit via Video Note  I connected with Amy Hooper on 08/19/2020 at  8:15 AM EDT by a video enabled telemedicine application and verified that I am speaking with the correct person using two identifiers.   I discussed the limitations of evaluation and management by telemedicine and the availability of in person appointments. The patient expressed understanding and agreed to proceed.  Visit Date: 08/19/2020 PCP: Isaac Bliss, [name], MD Referred by: Isaac Bliss, Estel*  Subjective: No chief complaint on file.  HPI: Amy Hooper is a 70 y.o. female. For evaluation management of chronic worsening back pain worse with standing and walking and once again with return of symptoms down the legs at this point.  She reports she reports average pain of 8 out of 10 which is intermittent aching worse with walking and going from sit to stand and sometimes prolonged sitting.  She gets little bit of relief with heat ice medication and exercise.  Her case is complicated from a chronic pain standpoint and this can be reviewed in all of her notes but basically she has fibromyalgia she has multi joint arthritis including arthritic changes of the lumbar spine and she is followed by Dr. Eduard Roux in our office from an orthopedic standpoint and he is going to complete a total knee replacement on the left in June.  She is also followed by Dr. Delice Lesch from a chronic pain management standpoint at the [facility] health center for physical medicine rehabilitation.  He follows her from a medication standpoint and does do trigger point injections at times as well as coordinates or physical therapy.  She has been in extensive physical therapy for her back and knees through the [facility] location and now at [place].  Brief review of history with me is that she was referred to [place] by her primary  care physician  [name], MD in the fall of last year but had also been referred at that time by Dr. Cy Blamer to physical medicine and rehabilitation at [facility].  Somewhat convoluted referral process and she was seeing multiple people at that time.  Nonetheless we did see her with evaluation and decided to complete L4 transforaminal epidural steroid injections bilaterally.  This is based on MRI findings of lateral recess narrowing and broad disc bulging likely impacting without focal compression nerve roots on either side bilaterally and this was accounting for hopefully her radicular type pain down the legs.  She actually had outstanding relief from that and that is well-documented and she was ecstatic with the amount of relief she had.  She is intolerant to a lot of medications and functionally this is is limiting what she would like to be able to do.  Review of Systems  Musculoskeletal: Positive for back pain and joint pain.       Lateral radicular leg pain  Neurological: Positive for tingling.  All other systems reviewed and are negative.  Otherwise per HPI.  Objective/Observation:  Patient was sitting in her car but reassured me that she has no weakness of the bilateral feet no foot dragging or falls or trauma.  She reports that ambulating is ordinarily difficulty with her knees and her back but she has had no focal weakness.  Assessment & Plan: Visit Diagnoses:  1. Lumbar radiculopathy   2. Spinal stenosis of lumbar region with neurogenic claudication  3. Spondylosis without myelopathy or radiculopathy, lumbar region   4. Chronic bilateral low back pain with bilateral sciatica     Plan: Findings:  Chronic worsening severe axial low back pain consistent with facet mediated low back pain worse with standing and ambulating but also with a history of intermittent radicular pain down both legs Duda lateral recess narrowing.  She has findings at L4-5 and L5-S1.  She has had  this ongoing now for years.  She reports many years of pain.  Epidural injection bilateral L4 diagnostically gave her quite a bit of relief and increased her functional ability and decreased her use of medications.  She continues in chronic pain management.  She has had physical therapy continues with home exercise program physician directed by myself and Dr. Posey Pronto.  She reports today worsening radicular leg pain once again.  I do think repeat epidural is warranted at this point.  That injection was performed in January and she obtained more than 4 months of relief.  She continues to have axial back pain that I think would be amenable to diagnostic medial branch blocks.  Not done at this point.  I will try to get some information about doing the epidural injection in regards to the knee surgery.    Meds & Orders: No orders of the defined types were placed in this encounter.  No orders of the defined types were placed in this encounter.   Follow-up: Return if symptoms worsen or fail to improve.  Follow Up Instructions:    I discussed the assessment and treatment plan with the patient. The patient was provided an opportunity to ask questions and all were answered. The patient agreed with the plan and demonstrated an understanding of the instructions.   The patient was advised to call back or seek an in-person evaluation if the symptoms worsen or if the condition fails to improve as anticipated.  I provided 20 minutes of non-face-to-face time during this encounter.   Laurence Spates, MD    Clinical History: MRI LUMBAR SPINE WITHOUT CONTRAST  TECHNIQUE: Multiplanar, multisequence MR imaging of the lumbar spine was performed. No intravenous contrast was administered.  COMPARISON: None.  FINDINGS: Segmentation: 5 lumbar type vertebral bodies. The last full intervertebral disc space is labeled L5-S1.  Alignment: Normal  Vertebrae: No bone lesions or fracture. A few small  scattered hemangiomas are noted.  Conus medullaris: Extends to the bottom of L1 level and appears normal.  Paraspinal and other soft tissues: No significant findings.  Disc levels:  L1-2: No significant findings.  L2-3: No significant findings.  L3-4: Mild to moderate facet disease but no disc protrusions, spinal or foraminal stenosis.  L4-5: Moderate facet disease and mild ligamentum flavum thickening. Possible small synovial cyst on the right side. Mild spinal and bilateral lateral recess stenosis but no foraminal stenosis.  L5-S1: Bulging annulus and central disc protrusion with mass effect on the thecal sac move and possibly irritating both S1 nerve roots. The exiting L5 nerve roots are normal. Moderate facet disease.  IMPRESSION: 1. 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 2. Central disc protrusion at L5-S1 with mass effect on the ventral thecal sac and possible irritation of both S1 nerve roots.   Electronically Signed By: Marijo Sanes M.D. On: 09/04/2016 10:30   She reports that she has never smoked. She has never used smokeless tobacco.  Recent Labs    02/05/20 0902  LABURIC 4.2    Past Medical/Family/Surgical/Social History: Medications & Allergies reviewed  per EMR, new medications updated. Patient Active Problem List   Diagnosis Date Noted  . Spondylosis without myelopathy or radiculopathy, lumbar region 07/17/2020  . Chronic pain syndrome 07/17/2020  . Myalgia 07/17/2020  . OSA (obstructive sleep apnea) 05/19/2020  . 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/17/2020  . Sleep disturbance 04/17/2020  . Chronic pain of both shoulders 03/03/2020  . Pain in both hands 03/03/2020  . Primary osteoarthritis of both knees 03/03/2020  . Pain in both feet 03/03/2020  . History of chronic kidney disease 03/03/2020  . DDD (degenerative disc disease), lumbar 02/05/2020  . Vitamin D deficiency 01/09/2020  . Hyperlipidemia 01/09/2020   . CKD (chronic kidney disease) stage 3, GFR 30-59 ml/min (HCC) 01/09/2020  . HTN (hypertension)   . Morbid obesity ([place])   . Gout   . Fibromyalgia   . Depression   . Insomnia    Past Medical History:  Diagnosis Date  . Chronic kidney disease   . Depression   . Fibromyalgia   . Gout   . HTN (hypertension)   . Insomnia   . Morbid obesity ([place])   . Osteoarthritis   . Palpitation    Family History  Problem Relation Age of Onset  . Hypothyroidism Mother   . Hyperlipidemia Other   . Kidney failure Father   . Dementia Father   . Gout Father   . Arthritis Father   . Prostate cancer Father    Past Surgical History:  Procedure Laterality Date  . KNEE ARTHROSCOPY    . right rotator cuff    . ROTATOR CUFF REPAIR Left   . TUBAL LIGATION     Social History   Occupational History  . 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 Smoking status: Never Smoker  . Smokeless tobacco: Never Used  Vaping Use  . Vaping Use: Never used  Substance and Sexual Activity  . Alcohol use: Never    Alcohol/week: 0.0 standard drinks  . Drug use: Never  . Sexual activity: Never

## 2020-08-19 NOTE — Telephone Encounter (Signed)
Yes that's fine 

## 2020-08-19 NOTE — Telephone Encounter (Signed)
See message below °

## 2020-08-19 NOTE — Telephone Encounter (Signed)
Needs auth and scheduling for bilateral L4 TF. Can be 15 minute appointment.

## 2020-08-20 NOTE — Telephone Encounter (Signed)
Scheduled for 6/2 at 1545 with driver. Patient needs work note for Friday.

## 2020-08-20 NOTE — Telephone Encounter (Signed)
Pt not req Auth#. 

## 2020-08-21 ENCOUNTER — Ambulatory Visit: Payer: Self-pay

## 2020-08-21 ENCOUNTER — Encounter: Payer: Self-pay | Admitting: Physical Medicine and Rehabilitation

## 2020-08-21 ENCOUNTER — Other Ambulatory Visit: Payer: Self-pay

## 2020-08-21 ENCOUNTER — Ambulatory Visit (INDEPENDENT_AMBULATORY_CARE_PROVIDER_SITE_OTHER): Payer: No Typology Code available for payment source | Admitting: Physical Medicine and Rehabilitation

## 2020-08-21 VITALS — BP 134/80 | HR 68

## 2020-08-21 DIAGNOSIS — M5416 Radiculopathy, lumbar region: Secondary | ICD-10-CM

## 2020-08-21 MED ORDER — METHYLPREDNISOLONE ACETATE 80 MG/ML IJ SUSP
80.0000 mg | Freq: Once | INTRAMUSCULAR | Status: AC
  Administered 2020-08-21: 80 mg

## 2020-08-21 NOTE — Progress Notes (Signed)
Amy Hooper - 70 y.o. female MRN [id number]  Date of birth: 09/05/1950  Office Visit Note: Visit Date: 08/21/2020 PCP: Isaac Bliss, [name], MD Referred by: Isaac Bliss, Estel*  Subjective: Chief Complaint  Patient presents with  . Lower Back - Pain  . Left Leg - Pain  . Right Leg - Pain   HPI:  Amy Hooper is a 70 y.o. female who comes in today for planned Bilateral L4-L5 Lumbar Transforaminal epidural steroid injection with fluoroscopic guidance.  The patient has failed conservative care including home exercise, medications, time and activity modification.  This injection will be diagnostic and hopefully therapeutic.  Please see requesting physician notes for further details and justification.   ROS Otherwise per HPI.  Assessment & Plan: Visit Diagnoses:    ICD-10-CM   1. Lumbar radiculopathy  M54.16 XR C-ARM NO REPORT    Epidural Steroid injection    methylPREDNISolone acetate (DEPO-MEDROL) injection 80 mg    Plan: No additional findings.   Meds & Orders:  Meds ordered this encounter  Medications  . methylPREDNISolone acetate (DEPO-MEDROL) injection 80 mg    Orders Placed This Encounter  Procedures  . XR C-ARM NO REPORT  . Epidural Steroid injection    Follow-up: Return if symptoms worsen or fail to improve.   Procedures: No procedures performed  Lumbosacral Transforaminal Epidural Steroid Injection - Sub-Pedicular Approach with Fluoroscopic Guidance  [name]      Date of Birth: Aug 10, 1950 MRN: [id number] PCP: Isaac Bliss, [name], MD      Visit Date: 08/21/2020   Universal Protocol:    Date/Time: 08/21/2020  Consent Given By: the patient  Position: PRONE  Additional Comments: Vital signs were monitored before and after the procedure. Patient was prepped and draped in the usual sterile fashion. The correct patient, procedure, and site was verified.   Injection Procedure Details:   Procedure  diagnoses: Lumbar radiculopathy [M54.16]    Meds Administered:  Meds ordered this encounter  Medications  . methylPREDNISolone acetate (DEPO-MEDROL) injection 80 mg    Laterality: Bilateral  Location/Site:  L4-L5  Needle:5.0 in., 22 ga.  Short bevel or Quincke spinal needle  Needle Placement: Transforaminal  Findings:    -Comments: Excellent flow of contrast along the nerve, nerve root and into the epidural space.  Procedure Details: After squaring off the end-plates to get a true AP view, the C-arm was positioned so that an oblique view of the foramen as noted above was visualized. The target area is just inferior to the "nose of the scotty dog" or sub pedicular. The soft tissues overlying this structure were infiltrated with 2-3 ml. of 1% Lidocaine without Epinephrine.  The spinal needle was inserted toward the target using a "trajectory" view along the fluoroscope beam.  Under AP and lateral visualization, the needle was advanced so it did not puncture dura and was located close the 6 O'Clock position of the pedical in AP tracterory. Biplanar projections were used to confirm position. Aspiration was confirmed to be negative for CSF and/or blood. A 1-2 ml. volume of Isovue-250 was injected and flow of contrast was noted at each level. Radiographs were obtained for documentation purposes.   After attaining the desired flow of contrast documented above, a 0.5 to 1.0 ml test dose of 0.25% Marcaine was injected into each respective transforaminal space.  The patient was observed for 90 seconds post injection.  After no sensory deficits were reported, and normal lower extremity motor function was noted,  the above injectate was administered so that equal amounts of the injectate were placed at each foramen (level) into the transforaminal epidural space.   Additional Comments:  The patient tolerated the procedure well Dressing: 2 x 2 sterile gauze and Band-Aid    Post-procedure  details: Patient was observed during the procedure. Post-procedure instructions were reviewed.  Patient left the clinic in stable condition.      Clinical History: MRI LUMBAR SPINE WITHOUT CONTRAST  TECHNIQUE: Multiplanar, multisequence MR imaging of the lumbar spine was performed. No intravenous contrast was administered.  COMPARISON: None.  FINDINGS: Segmentation: 5 lumbar type vertebral bodies. The last full intervertebral disc space is labeled L5-S1.  Alignment: Normal  Vertebrae: No bone lesions or fracture. A few small scattered hemangiomas are noted.  Conus medullaris: Extends to the bottom of L1 level and appears normal.  Paraspinal and other soft tissues: No significant findings.  Disc levels:  L1-2: No significant findings.  L2-3: No significant findings.  L3-4: Mild to moderate facet disease but no disc protrusions, spinal or foraminal stenosis.  L4-5: Moderate facet disease and mild ligamentum flavum thickening. Possible small synovial cyst on the right side. Mild spinal and bilateral lateral recess stenosis but no foraminal stenosis.  L5-S1: Bulging annulus and central disc protrusion with mass effect on the thecal sac move and possibly irritating both S1 nerve roots. The exiting L5 nerve roots are normal. Moderate facet disease.  IMPRESSION: 1. 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 2. Central disc protrusion at L5-S1 with mass effect on the ventral thecal sac and possible irritation of both S1 nerve roots.   Electronically Signed By: Marijo Sanes M.D. On: 09/04/2016 10:30     Objective:  VS:  HT:    WT:   BMI:     BP:134/80  HR:68bpm  TEMP: ( )  RESP:  Physical Exam Vitals and nursing note reviewed.  Constitutional:      General: She is not in acute distress.    Appearance: Normal appearance. She is not ill-appearing.  HENT:     Head: Normocephalic and atraumatic.     Right Ear: External ear normal.      Left Ear: External ear normal.  Eyes:     Extraocular Movements: Extraocular movements intact.  Cardiovascular:     Rate and Rhythm: Normal rate.     Pulses: Normal pulses.  Pulmonary:     Effort: Pulmonary effort is normal. No respiratory distress.  Abdominal:     General: There is no distension.     Palpations: Abdomen is soft.  Musculoskeletal:        General: Tenderness present.     Cervical back: Neck supple.     Right lower leg: No edema.     Left lower leg: No edema.     Comments: Patient has good distal strength with no pain over the greater trochanters.  No clonus or focal weakness.  Skin:    Findings: No erythema, lesion or rash.  Neurological:     General: No focal deficit present.     Mental Status: She is alert and oriented to person, place, and time.     Sensory: No sensory deficit.     Motor: No weakness or abnormal muscle tone.     Coordination: Coordination normal.  Psychiatric:        Mood and Affect: Mood normal.        Behavior: Behavior normal.      Imaging: XR C-ARM NO REPORT  Result Date: 08/21/2020 Please see Notes tab for imaging impression.

## 2020-08-21 NOTE — Patient Instructions (Signed)

## 2020-08-21 NOTE — Progress Notes (Signed)
Pt state lower back pain that travels down both leg. Pt state walking, standing and laying down. Pt state she takes pain meds to help ease the pain. Pt has hx of inj on 04/03/20 pt state it helped.  Numeric Pain Rating Scale and Functional Assessment Average Pain 6   In the last MONTH (on 0-10 scale) has pain interfered with the following?  1. General activity like being  able to carry out your everyday physical activities such as walking, climbing stairs, carrying groceries, or moving a chair?  Rating(10)   +Driver, -BT, -Dye Allergies.

## 2020-08-22 NOTE — Procedures (Signed)
Lumbosacral Transforaminal Epidural Steroid Injection - Sub-Pedicular Approach with Fluoroscopic Guidance  [name]      Date of Birth: 03/20/1951 MRN: [id number] PCP: Isaac Bliss, [name], MD      Visit Date: 08/21/2020   Universal Protocol:    Date/Time: 08/21/2020  Consent Given By: the patient  Position: PRONE  Additional Comments: Vital signs were monitored before and after the procedure. Patient was prepped and draped in the usual sterile fashion. The correct patient, procedure, and site was verified.   Injection Procedure Details:   Procedure diagnoses: Lumbar radiculopathy [M54.16]    Meds Administered:  Meds ordered this encounter  Medications  . methylPREDNISolone acetate (DEPO-MEDROL) injection 80 mg    Laterality: Bilateral  Location/Site:  L4-L5  Needle:5.0 in., 22 ga.  Short bevel or Quincke spinal needle  Needle Placement: Transforaminal  Findings:    -Comments: Excellent flow of contrast along the nerve, nerve root and into the epidural space.  Procedure Details: After squaring off the end-plates to get a true AP view, the C-arm was positioned so that an oblique view of the foramen as noted above was visualized. The target area is just inferior to the "nose of the scotty dog" or sub pedicular. The soft tissues overlying this structure were infiltrated with 2-3 ml. of 1% Lidocaine without Epinephrine.  The spinal needle was inserted toward the target using a "trajectory" view along the fluoroscope beam.  Under AP and lateral visualization, the needle was advanced so it did not puncture dura and was located close the 6 O'Clock position of the pedical in AP tracterory. Biplanar projections were used to confirm position. Aspiration was confirmed to be negative for CSF and/or blood. A 1-2 ml. volume of Isovue-250 was injected and flow of contrast was noted at each level. Radiographs were obtained for documentation purposes.   After  attaining the desired flow of contrast documented above, a 0.5 to 1.0 ml test dose of 0.25% Marcaine was injected into each respective transforaminal space.  The patient was observed for 90 seconds post injection.  After no sensory deficits were reported, and normal lower extremity motor function was noted,   the above injectate was administered so that equal amounts of the injectate were placed at each foramen (level) into the transforaminal epidural space.   Additional Comments:  The patient tolerated the procedure well Dressing: 2 x 2 sterile gauze and Band-Aid    Post-procedure details: Patient was observed during the procedure. Post-procedure instructions were reviewed.  Patient left the clinic in stable condition.

## 2020-08-26 ENCOUNTER — Encounter: Payer: Self-pay | Admitting: Neurology

## 2020-08-28 ENCOUNTER — Encounter: Payer: Self-pay | Admitting: Physical Medicine and Rehabilitation

## 2020-09-01 NOTE — Pre-Procedure Instructions (Signed)
Surgical Instructions    Your procedure is scheduled on Monday, June 20th.  Report to [facility] Main Entrance "A" at 5:30 A.M., then check in with the Admitting office.  Call this number if you have problems the morning of surgery:  [phone]   If you have any questions prior to your surgery date call [phone]: Open Monday-Friday 8am-4pm    Remember:  Do not eat after midnight the night before your surgery  You may drink clear liquids until 4:30 a.m. the morning of your surgery.   Clear liquids allowed are: Water, Non-Citrus Juices (without pulp), Carbonated Beverages, Clear Tea, Black Coffee Only, and Gatorade.   Enhanced Recovery after Surgery for Orthopedics Enhanced Recovery after Surgery is a protocol used to improve the stress on your body and your recovery after surgery.  Patient Instructions  The day of surgery (if you do NOT have diabetes):  Drink ONE (1) Pre-Surgery Clear Ensure by 4:30 am the morning of surgery   This drink was given to you during your hospital  pre-op appointment visit. Nothing else to drink after completing the  Pre-Surgery Clear Ensure.         If you have questions, please contact your surgeon's office.     Take these medicines the morning of surgery with A SIP OF WATER  allopurinol (ZYLOPRIM) amLODipine (NORVASC)  pregabalin (LYRICA)   Take these medicines AS NEEDED: acetaminophen (TYLENOL)  fluticasone (FLONASE)   As of today, STOP taking any Aspirin (unless otherwise instructed by your surgeon) Aleve, Naproxen, Ibuprofen, Motrin, Advil, Goody's, BC's, all herbal medications, fish oil, and all vitamins. This includes: diclofenac Sodium (VOLTAREN) 1 % GEL.                     Do NOT Smoke (Tobacco/Vaping) or drink Alcohol 24 hours prior to your procedure.  If you use a CPAP at night, you may bring all equipment for your overnight stay.   Contacts, glasses, piercing's, hearing aid's, dentures or partials may not be worn into  surgery, please bring cases for these belongings.    For patients admitted to the hospital, discharge time will be determined by your treatment team.   Patients discharged the day of surgery will not be allowed to drive home, and someone needs to stay with them for 24 hours.    Special instructions:   [facility]- Preparing For Surgery  Before surgery, you can play an important role. Because skin is not sterile, your skin needs to be as free of germs as possible. You can reduce the number of germs on your skin by washing with CHG (chlorahexidine gluconate) Soap before surgery.  CHG is an antiseptic cleaner which kills germs and bonds with the skin to continue killing germs even after washing.    Oral Hygiene is also important to reduce your risk of infection.  Remember - BRUSH YOUR TEETH THE MORNING OF SURGERY WITH YOUR REGULAR TOOTHPASTE  Please do not use if you have an allergy to CHG or antibacterial soaps. If your skin becomes reddened/irritated stop using the CHG.  Do not shave (including legs and underarms) for at least 48 hours prior to first CHG shower. It is OK to shave your face.  Please follow these instructions carefully.   Shower the NIGHT BEFORE SURGERY and the MORNING OF SURGERY  If you chose to wash your hair, wash your hair first as usual with your normal shampoo.  After you shampoo, rinse your hair and body thoroughly  to remove the shampoo.  Use CHG Soap as you would any other liquid soap. You can apply CHG directly to the skin and wash gently with a scrungie or a clean washcloth.   Apply the CHG Soap to your body ONLY FROM THE NECK DOWN.  Do not use on open wounds or open sores. Avoid contact with your eyes, ears, mouth and genitals (private parts). Wash Face and genitals (private parts)  with your normal soap.   Wash thoroughly, paying special attention to the area where your surgery will be performed.  Thoroughly rinse your body with warm water from the neck  down.  DO NOT shower/wash with your normal soap after using and rinsing off the CHG Soap.  Pat yourself dry with a CLEAN TOWEL.  Wear CLEAN PAJAMAS to bed the night before surgery  Place CLEAN SHEETS on your bed the night before your surgery  DO NOT SLEEP WITH PETS.   Day of Surgery: Shower with CHG soap. Do not wear jewelry, make up, or nail polish. Do not wear lotions, powders, perfumes, or deodorant. Do not shave 48 hours prior to surgery.  Do not bring valuables to the hospital. [facility] is not responsible for any belongings or valuables. Wear Clean/Comfortable clothing the morning of surgery Remember to brush your teeth WITH YOUR REGULAR TOOTHPASTE.   Please read over the following fact sheets that you were given.

## 2020-09-02 ENCOUNTER — Ambulatory Visit (HOSPITAL_COMMUNITY)
Admission: RE | Admit: 2020-09-02 | Discharge: 2020-09-02 | Disposition: A | Discharge status: Home / Self Care | Payer: PRIVATE HEALTH INSURANCE | Source: Ambulatory Visit | Attending: Physician Assistant | Admitting: Physician Assistant

## 2020-09-02 ENCOUNTER — Other Ambulatory Visit: Payer: Self-pay

## 2020-09-02 ENCOUNTER — Encounter (HOSPITAL_COMMUNITY): Payer: Self-pay

## 2020-09-02 ENCOUNTER — Encounter (HOSPITAL_COMMUNITY)
Admission: RE | Admit: 2020-09-02 | Discharge: 2020-09-02 | Disposition: A | Discharge status: Home / Self Care | Payer: PRIVATE HEALTH INSURANCE | Source: Ambulatory Visit | Attending: Orthopaedic Surgery | Admitting: Orthopaedic Surgery

## 2020-09-02 DIAGNOSIS — M1712 Unilateral primary osteoarthritis, left knee: Secondary | ICD-10-CM | POA: Insufficient documentation

## 2020-09-02 HISTORY — DX: Sleep apnea, unspecified: G47.30

## 2020-09-02 LAB — COMPREHENSIVE METABOLIC PANEL
ALT: 15 U/L (ref 0–44)
AST: 17 U/L (ref 15–41)
Albumin: 3.7 g/dL (ref 3.5–5.0)
Alkaline Phosphatase: 92 U/L (ref 38–126)
Anion gap: 9 (ref 5–15)
BUN: 29 mg/dL — ABNORMAL HIGH (ref 8–23)
CO2: 24 mmol/L (ref 22–32)
Calcium: 9.5 mg/dL (ref 8.9–10.3)
Chloride: 107 mmol/L (ref 98–111)
Creatinine, Ser: 1.56 mg/dL — ABNORMAL HIGH (ref 0.44–1.00)
GFR, Estimated: 36 mL/min — ABNORMAL LOW (ref >60)
Glucose, Bld: 85 mg/dL (ref 70–99)
Potassium: 3.8 mmol/L (ref 3.5–5.1)
Sodium: 140 mmol/L (ref 135–145)
Total Bilirubin: 0.3 mg/dL (ref 0.3–1.2)
Total Protein: 7.4 g/dL (ref 6.5–8.1)

## 2020-09-02 LAB — TYPE AND SCREEN
ABO/RH(D): O POS
Antibody Screen: NEGATIVE

## 2020-09-02 LAB — URINALYSIS, ROUTINE W REFLEX MICROSCOPIC
Bilirubin Urine: NEGATIVE
Glucose, UA: NEGATIVE mg/dL
Hgb urine dipstick: NEGATIVE
Ketones, ur: NEGATIVE mg/dL
Leukocytes,Ua: NEGATIVE
Nitrite: NEGATIVE
Protein, ur: NEGATIVE mg/dL
Specific Gravity, Urine: 1.012 (ref 1.005–1.030)
pH: 5 (ref 5.0–8.0)

## 2020-09-02 LAB — CBC WITH DIFFERENTIAL/PLATELET
Abs Immature Granulocytes: 0.01 10*3/uL (ref 0.00–0.07)
Basophils Absolute: 0 10*3/uL (ref 0.0–0.1)
Basophils Relative: 0 %
Eosinophils Absolute: 0.3 10*3/uL (ref 0.0–0.5)
Eosinophils Relative: 5 %
HCT: 37.4 % (ref 36.0–46.0)
Hemoglobin: 12.2 g/dL (ref 12.0–15.0)
Immature Granulocytes: 0 %
Lymphocytes Relative: 34 %
Lymphs Abs: 2.5 10*3/uL (ref 0.7–4.0)
MCH: 29.3 pg (ref 26.0–34.0)
MCHC: 32.6 g/dL (ref 30.0–36.0)
MCV: 89.9 fL (ref 80.0–100.0)
Monocytes Absolute: 0.7 10*3/uL (ref 0.1–1.0)
Monocytes Relative: 9 %
Neutro Abs: 4 10*3/uL (ref 1.7–7.7)
Neutrophils Relative %: 52 %
Platelets: 366 10*3/uL (ref 150–400)
RBC: 4.16 MIL/uL (ref 3.87–5.11)
RDW: 13.3 % (ref 11.5–15.5)
WBC: 7.6 10*3/uL (ref 4.0–10.5)
nRBC: 0 % (ref 0.0–0.2)

## 2020-09-02 LAB — APTT: aPTT: 29 seconds (ref 24–36)

## 2020-09-02 LAB — SURGICAL PCR SCREEN
MRSA, PCR: NEGATIVE
Staphylococcus aureus: NEGATIVE

## 2020-09-02 LAB — PROTIME-INR
INR: 0.9 (ref 0.8–1.2)
Prothrombin Time: 12.5 seconds (ref 11.4–15.2)

## 2020-09-02 IMAGING — CR DG CHEST 2V
2 series · 2 of 2 positions shown · non-contrast
Comparison: None.

CLINICAL DATA: Preoperative assessment for left knee arthroplasty

EXAM:
CHEST - 2 VIEW

[w chest pa]
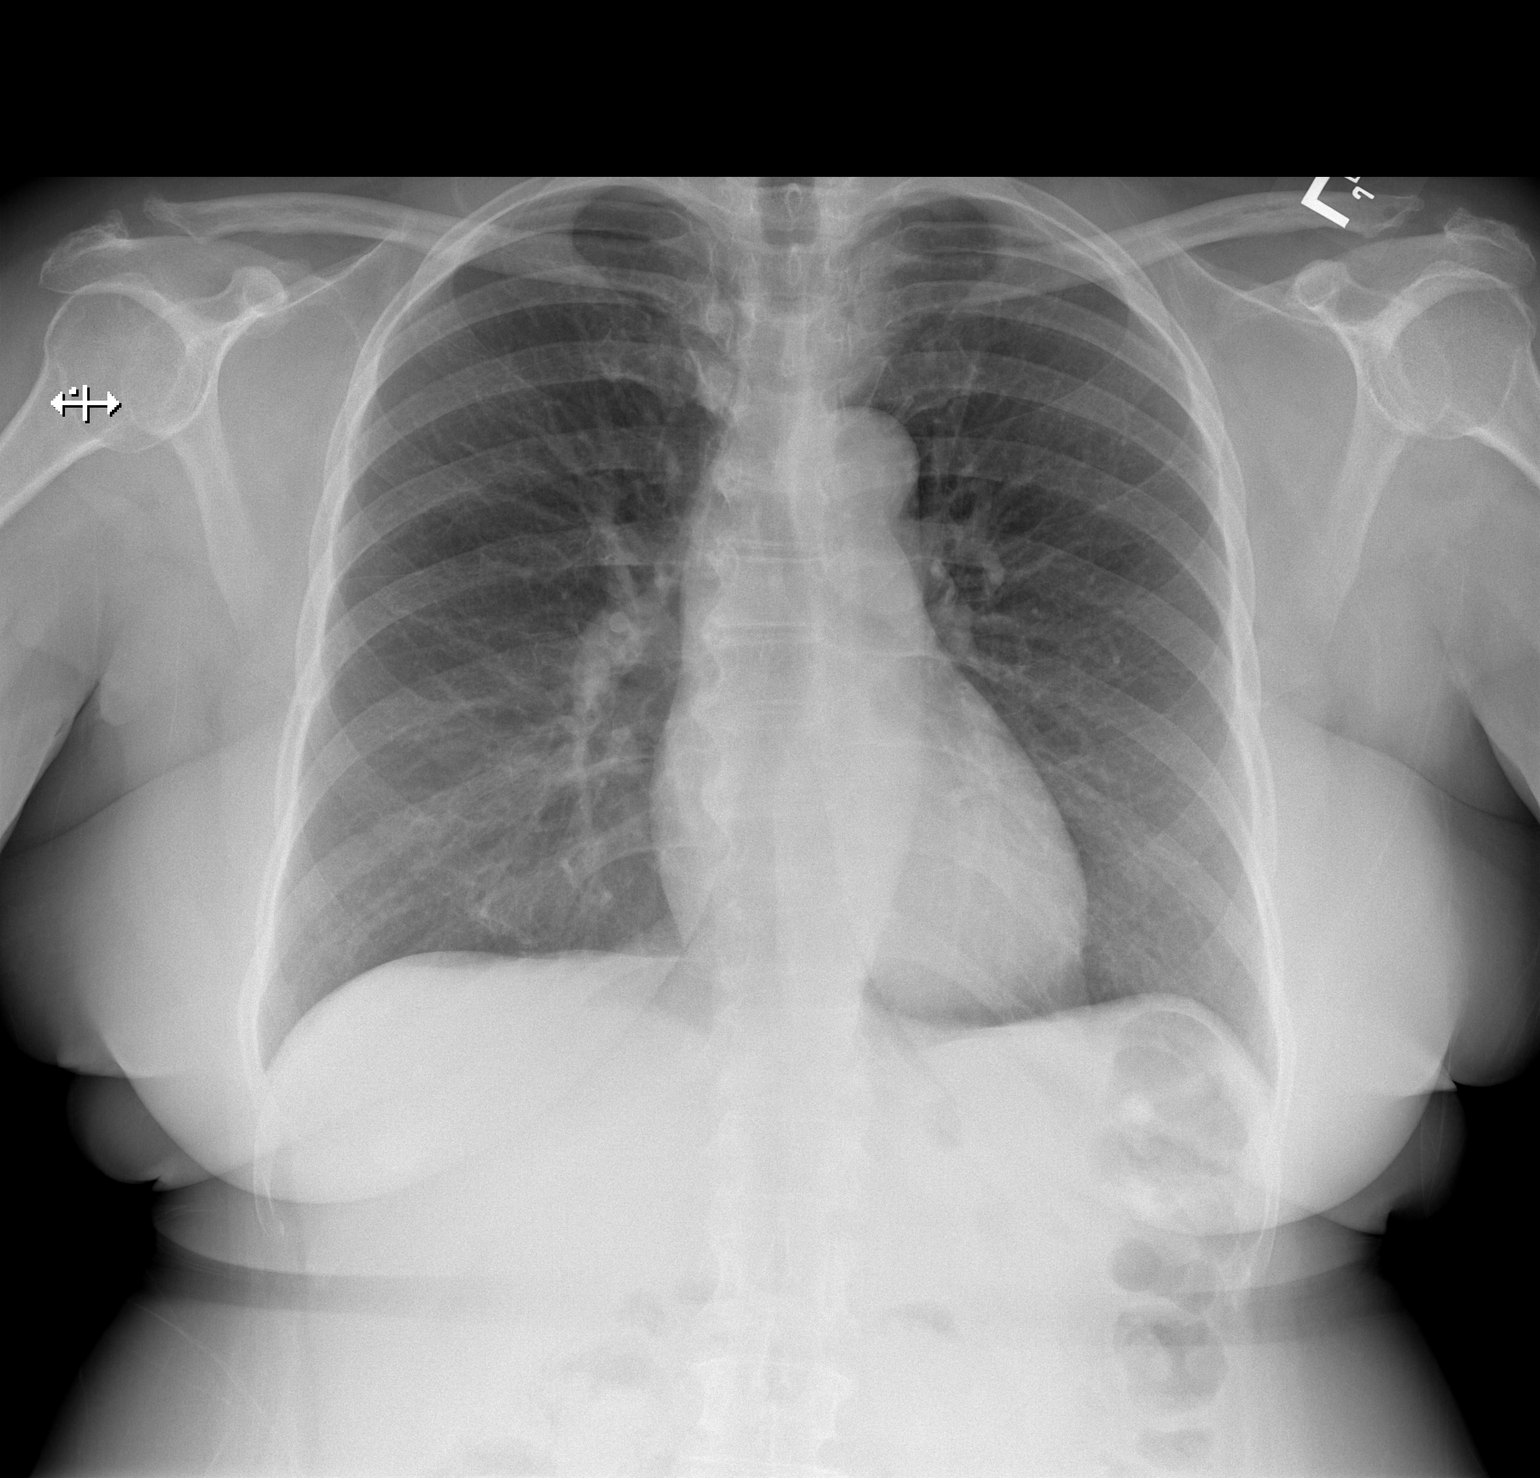

[w chest lat]
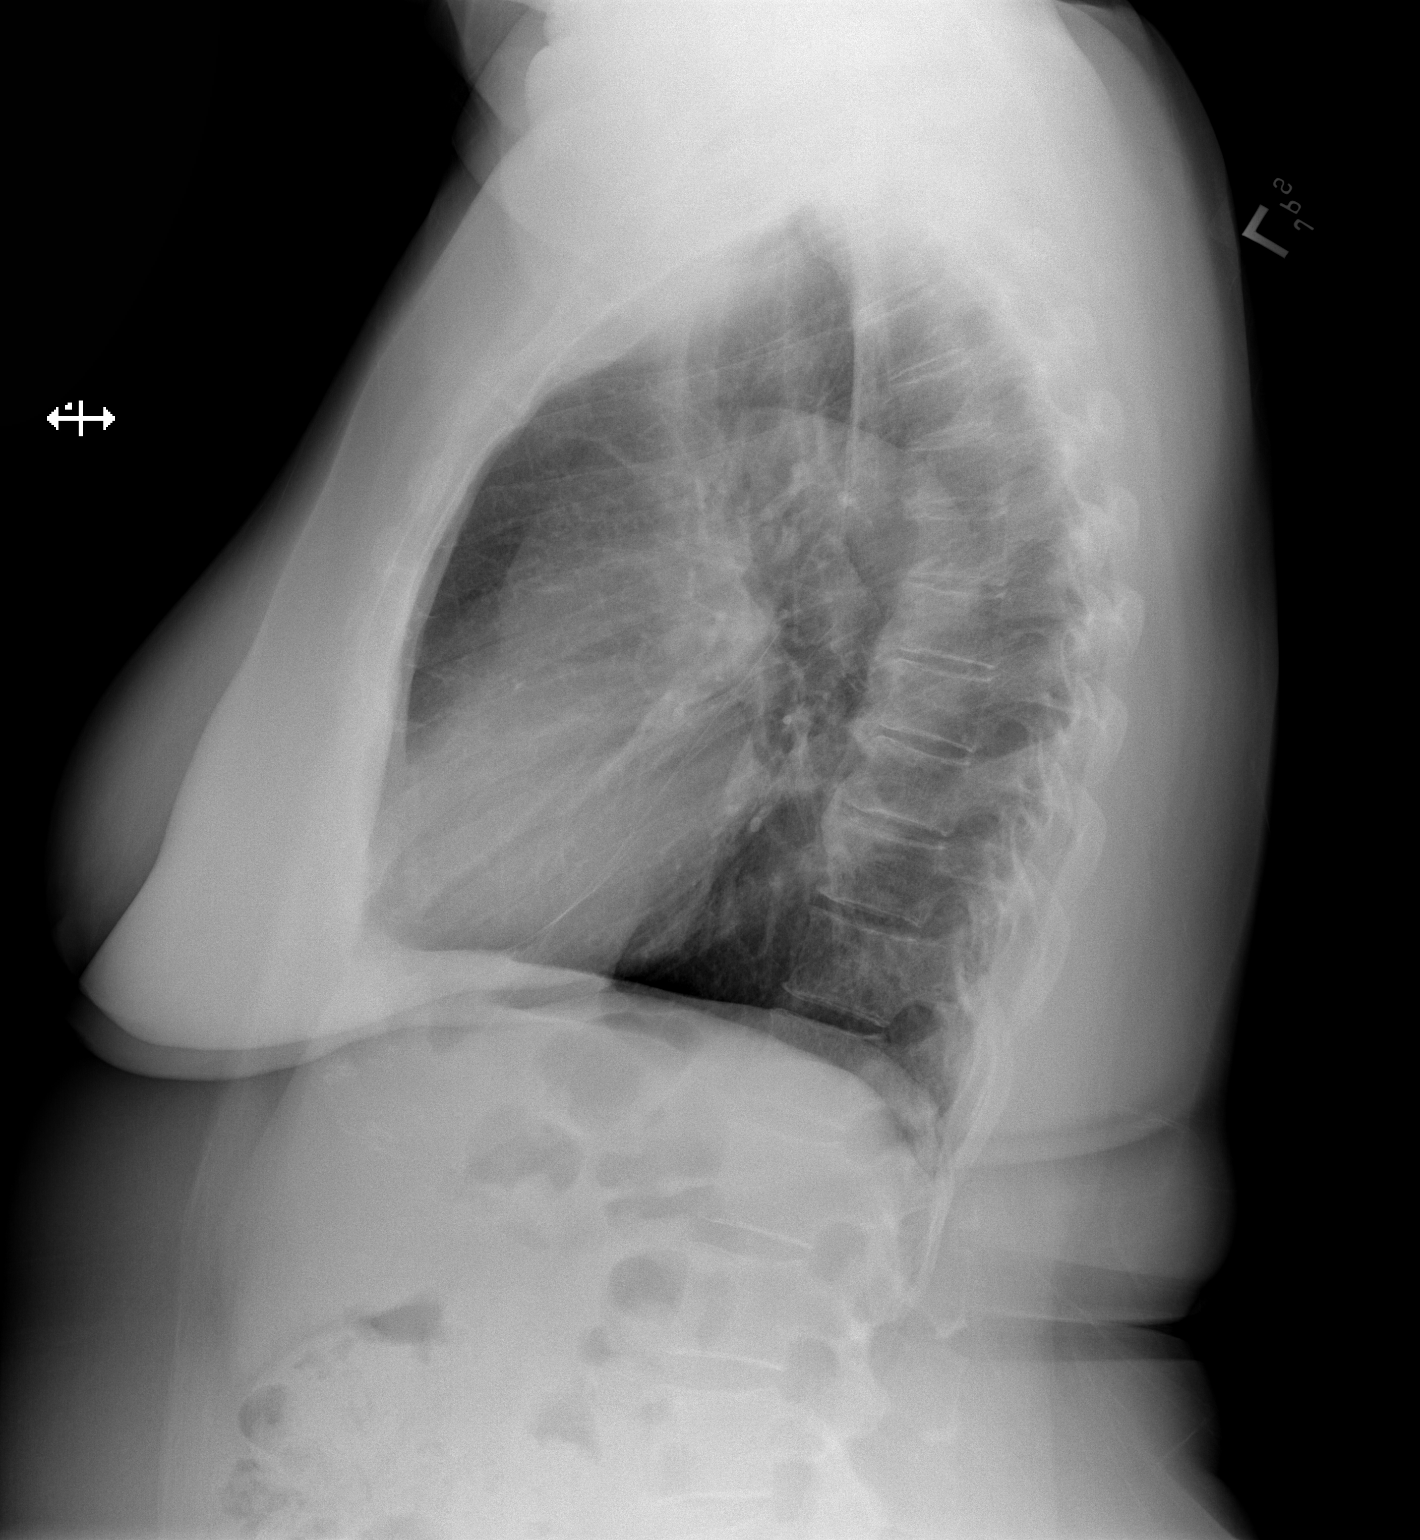

[2 of 2 positions shown; findings below may reference images not displayed]

FINDINGS: The heart size and mediastinal contours are within normal limits.
Both lungs are clear. The visualized skeletal structures are
unremarkable.
IMPRESSION: No active cardiopulmonary disease.

## 2020-09-02 NOTE — Progress Notes (Signed)
PCP - Dr. Jerilee Hoh Cardiologist - denies  Chest x-ray - 09/02/20 EKG - 09/02/20 Stress Test -  ECHO -  Cardiac Cath -   Sleep Study - yes CPAP - has not been able to get her CPAP d/t backorder    ERAS Protcol - yes PRE-SURGERY Ensure or G2- ensure  COVID TEST- 09/04/20   Anesthesia review: n/a  Patient denies shortness of breath, fever, cough and chest pain at PAT appointment   All instructions explained to the patient, with a verbal understanding of the material. Patient agrees to go over the instructions while at home for a better understanding. Patient also instructed to self quarantine after being tested for COVID-19. The opportunity to ask questions was provided.

## 2020-09-03 ENCOUNTER — Encounter: Payer: Self-pay | Admitting: Internal Medicine

## 2020-09-03 ENCOUNTER — Ambulatory Visit (INDEPENDENT_AMBULATORY_CARE_PROVIDER_SITE_OTHER): Payer: No Typology Code available for payment source | Admitting: Internal Medicine

## 2020-09-03 ENCOUNTER — Other Ambulatory Visit: Payer: Self-pay | Admitting: Physician Assistant

## 2020-09-03 ENCOUNTER — Other Ambulatory Visit: Payer: Self-pay | Admitting: Internal Medicine

## 2020-09-03 VITALS — BP 102/64 | HR 66 | Temp 98.2°F | Ht 62.0 in | Wt 211.0 lb

## 2020-09-03 DIAGNOSIS — I10 Essential (primary) hypertension: Secondary | ICD-10-CM

## 2020-09-03 DIAGNOSIS — E785 Hyperlipidemia, unspecified: Secondary | ICD-10-CM | POA: Diagnosis not present

## 2020-09-03 DIAGNOSIS — E559 Vitamin D deficiency, unspecified: Secondary | ICD-10-CM

## 2020-09-03 DIAGNOSIS — M170 Bilateral primary osteoarthritis of knee: Secondary | ICD-10-CM

## 2020-09-03 DIAGNOSIS — N1832 Chronic kidney disease, stage 3b: Secondary | ICD-10-CM

## 2020-09-03 DIAGNOSIS — N179 Acute kidney failure, unspecified: Secondary | ICD-10-CM

## 2020-09-03 DIAGNOSIS — F329 Major depressive disorder, single episode, unspecified: Secondary | ICD-10-CM

## 2020-09-03 DIAGNOSIS — M797 Fibromyalgia: Secondary | ICD-10-CM

## 2020-09-03 DIAGNOSIS — Z0000 Encounter for general adult medical examination without abnormal findings: Secondary | ICD-10-CM

## 2020-09-03 DIAGNOSIS — Z1231 Encounter for screening mammogram for malignant neoplasm of breast: Secondary | ICD-10-CM

## 2020-09-03 DIAGNOSIS — G4733 Obstructive sleep apnea (adult) (pediatric): Secondary | ICD-10-CM

## 2020-09-03 LAB — LIPID PANEL
Cholesterol: 169 mg/dL (ref 0–200)
HDL: 70.7 mg/dL (ref >39.00)
LDL Cholesterol: 90 mg/dL (ref 0–99)
NonHDL: 98.15
Total CHOL/HDL Ratio: 2
Triglycerides: 42 mg/dL (ref 0.0–149.0)
VLDL: 8.4 mg/dL (ref 0.0–40.0)

## 2020-09-03 LAB — VITAMIN D 25 HYDROXY (VIT D DEFICIENCY, FRACTURES): VITD: 24.55 ng/mL — ABNORMAL LOW (ref 30.00–100.00)

## 2020-09-03 LAB — VITAMIN B12: Vitamin B-12: 259 pg/mL (ref 211–911)

## 2020-09-03 LAB — HEMOGLOBIN A1C: Hgb A1c MFr Bld: 5.7 % (ref 4.6–6.5)

## 2020-09-03 LAB — TSH: TSH: 1.44 u[IU]/mL (ref 0.35–4.50)

## 2020-09-03 MED ORDER — METHOCARBAMOL 500 MG PO TABS: 500.0000 mg | ORAL_TABLET | Freq: Two times a day (BID) | ORAL | 0 refills | Status: DC | PRN

## 2020-09-03 MED ORDER — HYDROCODONE-ACETAMINOPHEN 7.5-325 MG PO TABS: 1.0000 | ORAL_TABLET | Freq: Four times a day (QID) | ORAL | 0 refills | Status: DC | PRN

## 2020-09-03 MED ORDER — ONDANSETRON HCL 4 MG PO TABS: 4.0000 mg | ORAL_TABLET | Freq: Four times a day (QID) | ORAL | 0 refills | Status: DC | PRN

## 2020-09-03 MED ORDER — VITAMIN D (ERGOCALCIFEROL) 1.25 MG (50000 UNIT) PO CAPS: 50000.0000 [IU] | ORAL_CAPSULE | ORAL | 0 refills | Status: AC

## 2020-09-03 MED ORDER — ASPIRIN EC 81 MG PO TBEC: 81.0000 mg | DELAYED_RELEASE_TABLET | Freq: Two times a day (BID) | ORAL | 0 refills | Status: DC

## 2020-09-03 NOTE — Addendum Note (Signed)
Addended by: Amanda Cockayne on: 09/03/2020 10:40 AM   Modules accepted: Orders

## 2020-09-03 NOTE — Addendum Note (Signed)
Addended by: Gwenyth Ober R on: 09/03/2020 10:36 AM   Modules accepted: Orders

## 2020-09-03 NOTE — Patient Instructions (Signed)
-  Nice seeing you today!!  -Lab work today; will notify you once results are available.  -Remember your 4th COVID vaccine and your shingles vaccines.  -Pap next visit.  -remember to schedule your eye exam.  -Schedule follow up in 4-6 months.

## 2020-09-03 NOTE — Progress Notes (Signed)
Established Patient Office Visit     This visit occurred during the SARS-CoV-2 public health emergency.  Safety protocols were in place, including screening questions prior to the visit, additional usage of staff PPE, and extensive cleaning of exam room while observing appropriate contact time as indicated for disinfecting solutions.    CC/Reason for Visit: Annual preventive exam  HPI: Amy Hooper is a 70 y.o. female who is coming in today for the above mentioned reasons. Past Medical History is significant for: Gout, hypertension, fibromyalgia, depression, insomnia stage III chronic kidney disease that has recently progressed from stage IIIa to stage IIIb, obstructive sleep apnea who is in the process of getting fitted for CPAP, morbid obesity.  She is having her left knee replacement surgery next week.  She is overdue for mammogram and Pap smear.  She had a colonoscopy in 2015.  She is due for her second COVID booster and her shingles vaccines.  She has routine dental care but no eye care.  She exercises routinely, no perceived hearing issues.   Past Medical/Surgical History: Past Medical History:  Diagnosis Date   Chronic kidney disease    Depression    Fibromyalgia    Gout    HTN (hypertension)    Insomnia    Morbid obesity (HCC)    Osteoarthritis    Palpitation    Sleep apnea     Past Surgical History:  Procedure Laterality Date   KNEE ARTHROSCOPY     right rotator cuff     ROTATOR CUFF REPAIR Left    TUBAL LIGATION      Social History:  reports that she has never smoked. She has never used smokeless tobacco. She reports that she does not drink alcohol and does not use drugs.  Allergies: Allergies  Allergen Reactions   Cefuroxime Axetil Rash and Hives   Clarithromycin Rash and Hives   Nsaids Nausea And Vomiting   Percocet [Oxycodone-Acetaminophen] Nausea And Vomiting    Pt. Also states it makes her hallucinate   Morphine     Oxycodone-Acetaminophen Other (See Comments)    Hallucination   Tramadol Nausea And Vomiting and Other (See Comments)    Upset stomach    Family History:  Family History  Problem Relation Age of Onset   Hypothyroidism Mother    Hyperlipidemia Other    Kidney failure Father    Dementia Father    Gout Father    Arthritis Father    Prostate cancer Father      Current Outpatient Medications:    acetaminophen (TYLENOL) 500 MG tablet, Take 1,000 mg by mouth every 8 (eight) hours as needed for moderate pain., Disp: , Rfl:    allopurinol (ZYLOPRIM) 100 MG tablet, Take 200 mg by mouth daily., Disp: , Rfl:    amLODipine (NORVASC) 5 MG tablet, Take 1 tablet (5 mg total) by mouth daily., Disp: 90 tablet, Rfl: 1   atorvastatin (LIPITOR) 10 MG tablet, Take 1 tablet (10 mg total) by mouth at bedtime., Disp: 90 tablet, Rfl: 1   diclofenac Sodium (VOLTAREN) 1 % GEL, Apply 1 application topically 4 (four) times daily as needed (pain)., Disp: , Rfl:    DULoxetine (CYMBALTA) 30 MG capsule, TAKE 1 CAPSULE BY MOUTH EVERY DAY (Patient taking differently: Take 30 mg by mouth at bedtime. TAKE 1 CAPSULE BY MOUTH EVERY DAY), Disp: 90 capsule, Rfl: 1   eszopiclone (LUNESTA) 2 MG TABS tablet, TAKE 1 TABLET BY MOUTH AT NIGHT IMMEDIATELY BEFORE BEDTIME (Patient  taking differently: Take 2 mg by mouth at bedtime.), Disp: 30 tablet, Rfl: 2   fluticasone (FLONASE) 50 MCG/ACT nasal spray, Place 1 spray into both nostrils daily as needed for allergies., Disp: , Rfl:    loratadine (CLARITIN) 10 MG tablet, Take 10 mg by mouth daily as needed for allergies., Disp: , Rfl:    losartan (COZAAR) 50 MG tablet, Take 1 tablet (50 mg total) by mouth daily., Disp: 90 tablet, Rfl: 1   pregabalin (LYRICA) 75 MG capsule, Take 1 capsule (75 mg total) by mouth 3 (three) times daily., Disp: 90 capsule, Rfl: 5   diazepam (VALIUM) 2 MG tablet, TAKE 1 TABLET BY MOUTH AS NEEDED FOR MRI*04/29/20*, Disp: , Rfl:   Review of Systems:   Constitutional: Denies fever, chills, diaphoresis, appetite change and fatigue.  HEENT: Denies photophobia, eye pain, redness, hearing loss, ear pain, congestion, sore throat, rhinorrhea, sneezing, mouth sores, trouble swallowing, neck pain, neck stiffness and tinnitus.   Respiratory: Denies SOB, DOE, cough, chest tightness,  and wheezing.   Cardiovascular: Denies chest pain, palpitations and leg swelling.  Gastrointestinal: Denies nausea, vomiting, abdominal pain, diarrhea, constipation, blood in stool and abdominal distention.  Genitourinary: Denies dysuria, urgency, frequency, hematuria, flank pain and difficulty urinating.  Endocrine: Denies: hot or cold intolerance, sweats, changes in hair or nails, polyuria, polydipsia. Musculoskeletal: Denies myalgias, back pain, joint swelling, arthralgias and gait problem.  Skin: Denies pallor, rash and wound.  Neurological: Denies dizziness, seizures, syncope, weakness, light-headedness, numbness and headaches.  Hematological: Denies adenopathy. Easy bruising, personal or family bleeding history  Psychiatric/Behavioral: Denies suicidal ideation, mood changes, confusion, nervousness, sleep disturbance and agitation    Physical Exam: Vitals:   09/03/20 0952  BP: 102/64  Pulse: 66  Temp: 98.2 F (36.8 C)  TempSrc: Oral  SpO2: 98%  Weight: 211 lb (95.7 kg)  Height: 5\' 2"  (1.575 m)    Body mass index is 38.59 kg/m.   Constitutional: NAD, calm, comfortable, obese Eyes: PERRL, lids and conjunctivae normal, wears corrective lenses ENMT: Mucous membranes are moist. Posterior pharynx clear of any exudate or lesions.  Wears complete upper dentures. Tympanic membrane is pearly white, no erythema or bulging. Neck: normal, supple, no masses, no thyromegaly Respiratory: clear to auscultation bilaterally, no wheezing, no crackles. Normal respiratory effort. No accessory muscle use.  Cardiovascular: Regular rate and rhythm, no murmurs / rubs /  gallops. No extremity edema. 2+ pedal pulses. No carotid bruits.  Abdomen: no tenderness, no masses palpated. No hepatosplenomegaly. Bowel sounds positive.  Musculoskeletal: no clubbing / cyanosis. No joint deformity upper and lower extremities. Good ROM, no contractures. Normal muscle tone.  Skin: no rashes, lesions, ulcers. No induration Neurologic: CN 2-12 grossly intact. Sensation intact, DTR normal. Strength 5/5 in all 4.  Psychiatric: Normal judgment and insight. Alert and oriented x 3. Normal mood.    Impression and Plan:  Encounter for preventive health examination  -Advised routine eye and dental care. -She is due for her second COVID booster and her shingles series however she elects to defer given surgery next week. -Screening labs today. -Healthy lifestyle discussed in detail. -She had a DEXA scan in 2021. -She had a colonoscopy in 2015 and is a 10-year callback. -Mammogram to be requested today. -She is overdue for Pap smear but prefers to defer until next visit.  Morbid obesity ([place]) -Discussed healthy lifestyle, including increased physical activity and better food choices to promote weight loss.  Chronic kidney disease stage IIIb -Advised to follow-up with nephrology due  to progressing CKD.  Hyperlipidemia, unspecified hyperlipidemia type - -Check lipids. -She is on atorvastatin 10 mg daily.  Primary hypertension -Blood pressures currently well controlled.  Vitamin D deficiency  - Plan: VITAMIN D 25 Hydroxy (Vit-D Deficiency, Fractures)  Fibromyalgia -Recently started on Lyrica.  OSA (obstructive sleep apnea) -In the process of getting fitted for CPAP  Primary osteoarthritis of both knees -For left knee replacement next week.  Reactive depression -Mood is currently stable.   Patient Instructions  -Nice seeing you today!!  -Lab work today; will notify you once results are available.  -Remember your 4th COVID vaccine and your shingles  vaccines.  -Pap next visit.  -remember to schedule your eye exam.  -Schedule follow up in 4-6 months.    Lelon Frohlich, MD [facility]

## 2020-09-04 ENCOUNTER — Other Ambulatory Visit: Payer: Self-pay | Admitting: Internal Medicine

## 2020-09-04 ENCOUNTER — Other Ambulatory Visit (HOSPITAL_COMMUNITY)
Admission: RE | Admit: 2020-09-04 | Discharge: 2020-09-04 | Disposition: A | Discharge status: Home / Self Care | Payer: PRIVATE HEALTH INSURANCE | Source: Ambulatory Visit | Attending: Orthopaedic Surgery | Admitting: Orthopaedic Surgery

## 2020-09-04 DIAGNOSIS — Z20822 Contact with and (suspected) exposure to covid-19: Secondary | ICD-10-CM | POA: Insufficient documentation

## 2020-09-04 DIAGNOSIS — Z01812 Encounter for preprocedural laboratory examination: Secondary | ICD-10-CM | POA: Diagnosis present

## 2020-09-04 LAB — SARS CORONAVIRUS 2 (TAT 6-24 HRS): SARS Coronavirus 2: NEGATIVE

## 2020-09-07 ENCOUNTER — Encounter (HOSPITAL_COMMUNITY): Payer: Self-pay | Admitting: Orthopaedic Surgery

## 2020-09-07 NOTE — Anesthesia Preprocedure Evaluation (Addendum)
Anesthesia Evaluation  Patient identified by MRN, date of birth, ID band Patient awake    Reviewed: Allergy & Precautions, NPO status , Patient's Chart, lab work & pertinent test results, reviewed documented beta blocker date and time   Airway Mallampati: II  TM Distance: >3 FB Neck ROM: Full    Dental  (+) Dental Advisory Given   Pulmonary sleep apnea and Continuous Positive Airway Pressure Ventilation ,    Pulmonary exam normal breath sounds clear to auscultation       Cardiovascular hypertension, Pt. on medications Normal cardiovascular exam Rhythm:Regular Rate:Normal  EKG 09/02/20 NSR, normal   Neuro/Psych PSYCHIATRIC DISORDERS Depression  Neuromuscular disease    GI/Hepatic negative GI ROS, Neg liver ROS,   Endo/Other  Morbid obesityHyperlipidemia  Renal/GU Renal InsufficiencyRenal disease  negative genitourinary   Musculoskeletal  (+) Arthritis , Osteoarthritis,  Fibromyalgia -OA both knees   Abdominal (+) + obese,   Peds  Hematology negative hematology ROS (+)   Anesthesia Other Findings   Reproductive/Obstetrics                            Anesthesia Physical Anesthesia Plan  ASA: 3  Anesthesia Plan: Spinal   Post-op Pain Management:  Regional for Post-op pain   Induction: Intravenous  PONV Risk Score and Plan: 3 and Treatment may vary due to age or medical condition, Propofol infusion, Ondansetron and Midazolam  Airway Management Planned: Natural Airway and Simple Face Mask  Additional Equipment:   Intra-op Plan:   Post-operative Plan:   Informed Consent: I have reviewed the patients History and Physical, chart, labs and discussed the procedure including the risks, benefits and alternatives for the proposed anesthesia with the patient or authorized representative who has indicated his/her understanding and acceptance.     Dental advisory given  Plan Discussed with:  CRNA and Anesthesiologist  Anesthesia Plan Comments:        Anesthesia Quick Evaluation

## 2020-09-08 ENCOUNTER — Other Ambulatory Visit: Payer: Self-pay

## 2020-09-08 ENCOUNTER — Ambulatory Visit (HOSPITAL_COMMUNITY): Payer: BC Managed Care – PPO | Admitting: Anesthesiology

## 2020-09-08 ENCOUNTER — Encounter (HOSPITAL_COMMUNITY): Payer: Self-pay | Admitting: Orthopaedic Surgery

## 2020-09-08 ENCOUNTER — Observation Stay (HOSPITAL_COMMUNITY)
Admission: RE | Admit: 2020-09-08 | Discharge: 2020-09-09 | Disposition: A | Discharge status: Home / Self Care | Payer: BC Managed Care – PPO | Attending: Orthopaedic Surgery | Admitting: Orthopaedic Surgery

## 2020-09-08 ENCOUNTER — Encounter (HOSPITAL_COMMUNITY)
Admission: RE | Disposition: A | Discharge status: Home / Self Care | Payer: Self-pay | Source: Home / Self Care | Attending: Orthopaedic Surgery

## 2020-09-08 ENCOUNTER — Observation Stay (HOSPITAL_COMMUNITY): Payer: BC Managed Care – PPO

## 2020-09-08 DIAGNOSIS — Z7982 Long term (current) use of aspirin: Secondary | ICD-10-CM | POA: Diagnosis not present

## 2020-09-08 DIAGNOSIS — M172 Bilateral post-traumatic osteoarthritis of knee: Secondary | ICD-10-CM | POA: Diagnosis present

## 2020-09-08 DIAGNOSIS — M179 Osteoarthritis of knee, unspecified: Secondary | ICD-10-CM | POA: Diagnosis present

## 2020-09-08 DIAGNOSIS — Z96652 Presence of left artificial knee joint: Secondary | ICD-10-CM

## 2020-09-08 DIAGNOSIS — Z79899 Other long term (current) drug therapy: Secondary | ICD-10-CM | POA: Diagnosis not present

## 2020-09-08 DIAGNOSIS — M1712 Unilateral primary osteoarthritis, left knee: Secondary | ICD-10-CM | POA: Diagnosis not present

## 2020-09-08 DIAGNOSIS — I129 Hypertensive chronic kidney disease with stage 1 through stage 4 chronic kidney disease, or unspecified chronic kidney disease: Secondary | ICD-10-CM | POA: Diagnosis not present

## 2020-09-08 DIAGNOSIS — N189 Chronic kidney disease, unspecified: Secondary | ICD-10-CM | POA: Diagnosis not present

## 2020-09-08 DIAGNOSIS — M1710 Unilateral primary osteoarthritis, unspecified knee: Secondary | ICD-10-CM | POA: Diagnosis present

## 2020-09-08 DIAGNOSIS — R52 Pain, unspecified: Secondary | ICD-10-CM

## 2020-09-08 HISTORY — PX: TOTAL KNEE ARTHROPLASTY: SHX125

## 2020-09-08 LAB — ABO/RH: ABO/RH(D): O POS

## 2020-09-08 IMAGING — DX DG KNEE 1-2V PORT*L*
2 series · 2 of 2 positions shown · non-contrast
Comparison: None.

CLINICAL DATA: Status post left knee replacement

EXAM:
PORTABLE LEFT KNEE - 1-2 VIEW

[knee ap]
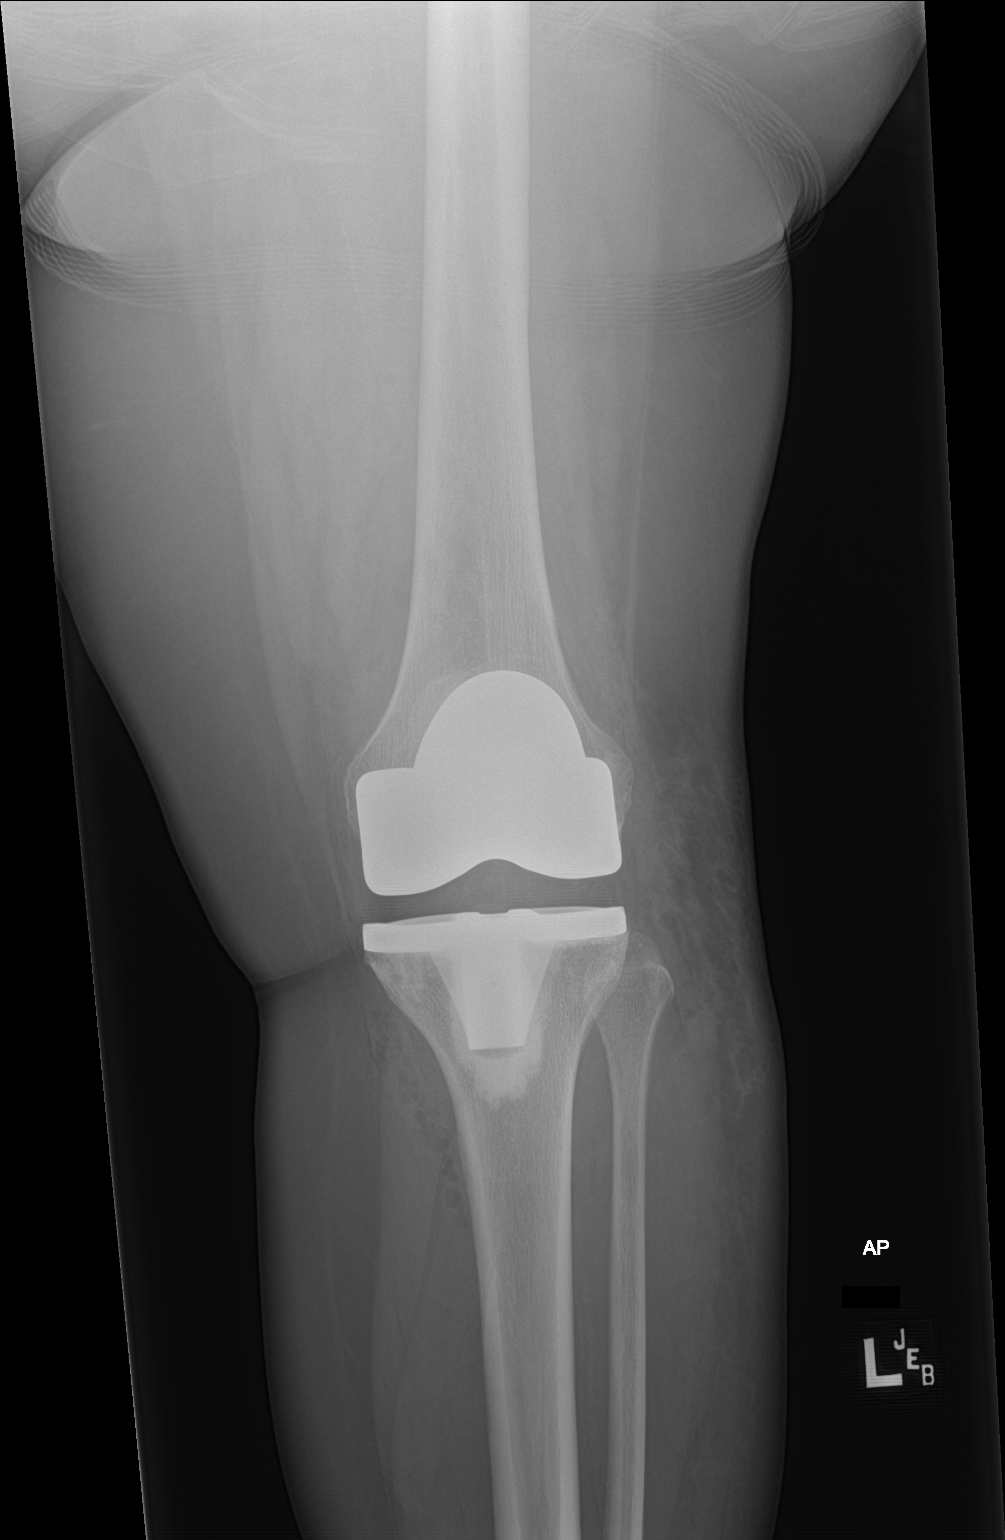

[knee lat]
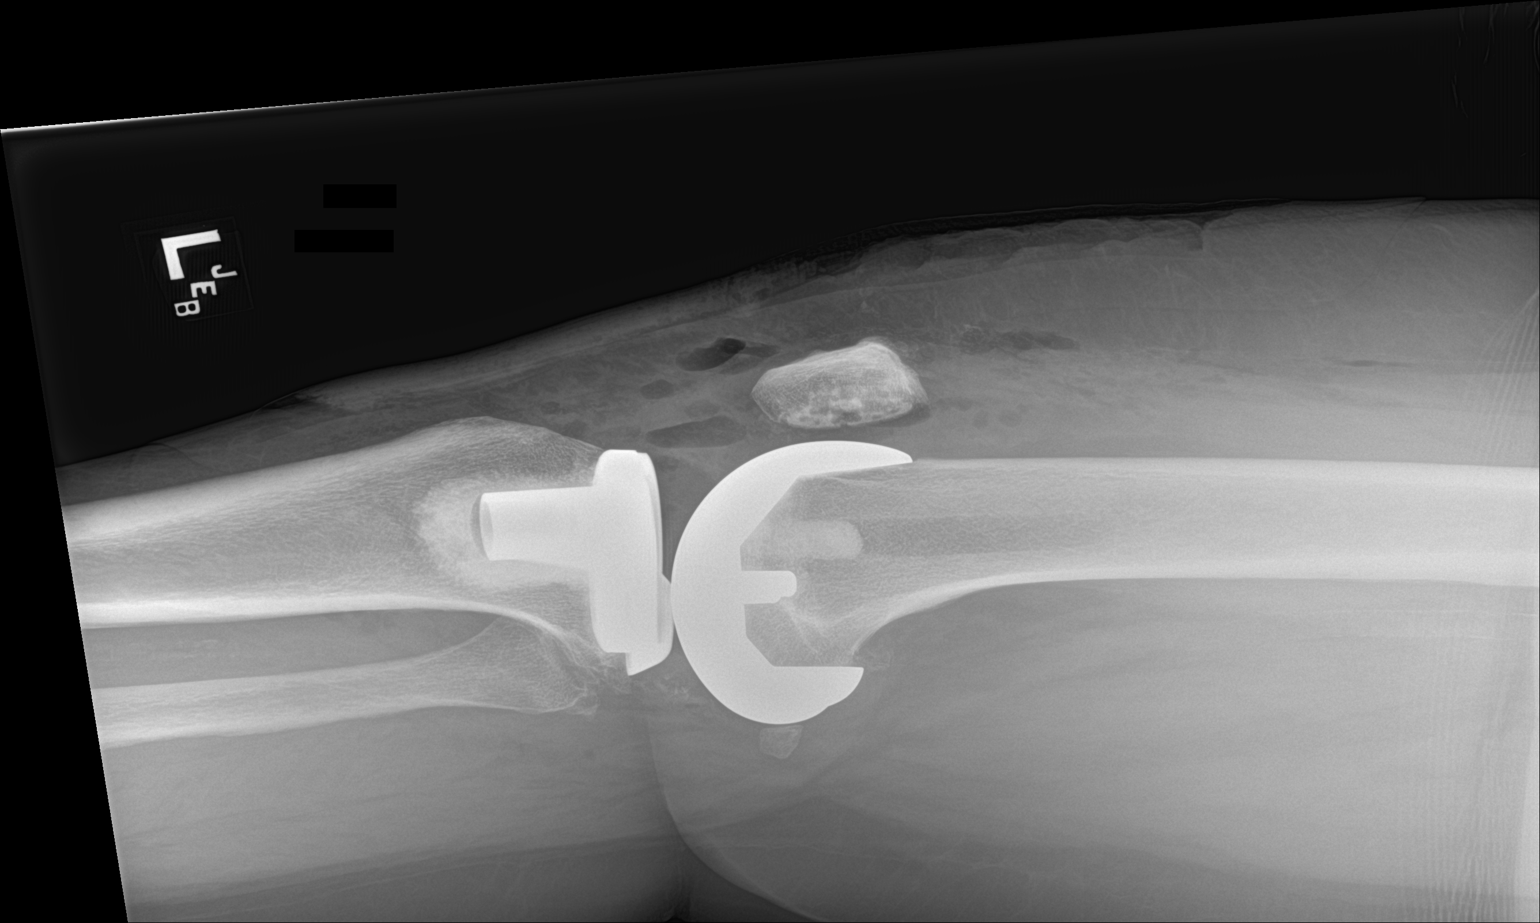

[2 of 2 positions shown; findings below may reference images not displayed]

FINDINGS: The left knee demonstrates a total knee arthroplasty without
evidence of hardware failure complication. There is no significant
joint effusion. There is no fracture or dislocation. The alignment
is anatomic. Post-surgical changes noted in the surrounding soft
tissues.
IMPRESSION: Interval left total knee arthroplasty.

## 2020-09-08 SURGERY — ARTHROPLASTY, KNEE, TOTAL
Anesthesia: Spinal | Site: Knee | Laterality: Left

## 2020-09-08 MED ORDER — ACETAMINOPHEN 10 MG/ML IV SOLN
INTRAVENOUS | Status: AC
  Filled 2020-09-08: qty 100

## 2020-09-08 MED ORDER — METOCLOPRAMIDE HCL 5 MG/ML IJ SOLN: 5.0000 mg | Freq: Three times a day (TID) | INTRAMUSCULAR | Status: DC | PRN

## 2020-09-08 MED ORDER — POVIDONE-IODINE 10 % EX SWAB: 2.0000 "application " | Freq: Once | CUTANEOUS | Status: DC

## 2020-09-08 MED ORDER — HYDROCODONE-ACETAMINOPHEN 7.5-325 MG PO TABS
1.0000 | ORAL_TABLET | ORAL | Status: DC | PRN
  Administered 2020-09-08 – 2020-09-09 (×5): 2 via ORAL
  Filled 2020-09-08 (×6): qty 2

## 2020-09-08 MED ORDER — CHLORHEXIDINE GLUCONATE 0.12 % MT SOLN
OROMUCOSAL | Status: AC
  Filled 2020-09-08: qty 15

## 2020-09-08 MED ORDER — ASPIRIN 81 MG PO CHEW
81.0000 mg | CHEWABLE_TABLET | Freq: Two times a day (BID) | ORAL | Status: DC
  Administered 2020-09-08: 81 mg via ORAL
  Filled 2020-09-08: qty 1

## 2020-09-08 MED ORDER — MORPHINE SULFATE (PF) 2 MG/ML IV SOLN: 0.5000 mg | INTRAVENOUS | Status: DC | PRN

## 2020-09-08 MED ORDER — PREGABALIN 75 MG PO CAPS
75.0000 mg | ORAL_CAPSULE | Freq: Three times a day (TID) | ORAL | Status: DC
  Administered 2020-09-08 – 2020-09-09 (×4): 75 mg via ORAL
  Filled 2020-09-08 (×4): qty 1

## 2020-09-08 MED ORDER — CHLORHEXIDINE GLUCONATE 0.12 % MT SOLN
15.0000 mL | Freq: Once | OROMUCOSAL | Status: AC
  Administered 2020-09-08: 15 mL via OROMUCOSAL

## 2020-09-08 MED ORDER — BUPIVACAINE IN DEXTROSE 0.75-8.25 % IT SOLN
INTRATHECAL | Status: DC | PRN
  Administered 2020-09-08: 1.6 mL via INTRATHECAL

## 2020-09-08 MED ORDER — DULOXETINE HCL 30 MG PO CPEP
30.0000 mg | ORAL_CAPSULE | Freq: Every day | ORAL | Status: DC
  Administered 2020-09-08: 30 mg via ORAL
  Filled 2020-09-08: qty 1

## 2020-09-08 MED ORDER — METHOCARBAMOL 1000 MG/10ML IJ SOLN
500.0000 mg | Freq: Four times a day (QID) | INTRAMUSCULAR | Status: DC | PRN
  Filled 2020-09-08: qty 5

## 2020-09-08 MED ORDER — CEFAZOLIN SODIUM-DEXTROSE 2-4 GM/100ML-% IV SOLN
2.0000 g | Freq: Four times a day (QID) | INTRAVENOUS | Status: AC
  Administered 2020-09-08 (×2): 2 g via INTRAVENOUS
  Filled 2020-09-08 (×2): qty 100

## 2020-09-08 MED ORDER — PHENYLEPHRINE HCL-NACL 10-0.9 MG/250ML-% IV SOLN
INTRAVENOUS | Status: DC | PRN
  Administered 2020-09-08: 30 ug/min via INTRAVENOUS

## 2020-09-08 MED ORDER — SODIUM CHLORIDE 0.9 % IR SOLN
Status: DC | PRN
  Administered 2020-09-08: 3000 mL

## 2020-09-08 MED ORDER — PHENYLEPHRINE HCL (PRESSORS) 10 MG/ML IV SOLN
INTRAVENOUS | Status: DC | PRN
  Administered 2020-09-08 (×3): 80 ug via INTRAVENOUS

## 2020-09-08 MED ORDER — PROPOFOL 500 MG/50ML IV EMUL
INTRAVENOUS | Status: DC | PRN
  Administered 2020-09-08: 75 ug/kg/min via INTRAVENOUS

## 2020-09-08 MED ORDER — ACETAMINOPHEN 500 MG PO TABS
500.0000 mg | ORAL_TABLET | Freq: Four times a day (QID) | ORAL | Status: AC
  Administered 2020-09-08 – 2020-09-09 (×4): 500 mg via ORAL
  Filled 2020-09-08 (×4): qty 1

## 2020-09-08 MED ORDER — HYDROXYZINE HCL 50 MG/ML IM SOLN
50.0000 mg | Freq: Four times a day (QID) | INTRAMUSCULAR | Status: DC | PRN
  Administered 2020-09-08 – 2020-09-09 (×2): 50 mg via INTRAMUSCULAR
  Filled 2020-09-08 (×2): qty 1

## 2020-09-08 MED ORDER — MIDAZOLAM HCL 2 MG/2ML IJ SOLN
INTRAMUSCULAR | Status: AC
  Filled 2020-09-08: qty 2

## 2020-09-08 MED ORDER — HYDROCODONE-ACETAMINOPHEN 5-325 MG PO TABS: 1.0000 | ORAL_TABLET | ORAL | Status: DC | PRN

## 2020-09-08 MED ORDER — VANCOMYCIN HCL 1000 MG IV SOLR
INTRAVENOUS | Status: DC | PRN
  Administered 2020-09-08: 1000 mg

## 2020-09-08 MED ORDER — HYDROMORPHONE HCL 1 MG/ML IJ SOLN: 0.2500 mg | INTRAMUSCULAR | Status: DC | PRN

## 2020-09-08 MED ORDER — TRANEXAMIC ACID 1000 MG/10ML IV SOLN
2000.0000 mg | INTRAVENOUS | Status: DC
  Filled 2020-09-08: qty 20

## 2020-09-08 MED ORDER — METOCLOPRAMIDE HCL 5 MG PO TABS: 5.0000 mg | ORAL_TABLET | Freq: Three times a day (TID) | ORAL | Status: DC | PRN

## 2020-09-08 MED ORDER — ACETAMINOPHEN 10 MG/ML IV SOLN
INTRAVENOUS | Status: DC | PRN
  Administered 2020-09-08: 1000 mg via INTRAVENOUS

## 2020-09-08 MED ORDER — TRANEXAMIC ACID-NACL 1000-0.7 MG/100ML-% IV SOLN
1000.0000 mg | Freq: Once | INTRAVENOUS | Status: AC
Start: 2020-09-08 — End: 2020-09-08
  Administered 2020-09-08: 1000 mg via INTRAVENOUS
  Filled 2020-09-08: qty 100

## 2020-09-08 MED ORDER — LACTATED RINGERS IV SOLN: INTRAVENOUS | Status: DC

## 2020-09-08 MED ORDER — ONDANSETRON HCL 4 MG/2ML IJ SOLN: 4.0000 mg | Freq: Four times a day (QID) | INTRAMUSCULAR | Status: DC | PRN

## 2020-09-08 MED ORDER — CEFAZOLIN SODIUM-DEXTROSE 2-4 GM/100ML-% IV SOLN
INTRAVENOUS | Status: AC
  Filled 2020-09-08: qty 100

## 2020-09-08 MED ORDER — DOCUSATE SODIUM 100 MG PO CAPS
100.0000 mg | ORAL_CAPSULE | Freq: Two times a day (BID) | ORAL | Status: DC
  Administered 2020-09-08 – 2020-09-09 (×2): 100 mg via ORAL
  Filled 2020-09-08 (×2): qty 1

## 2020-09-08 MED ORDER — AMLODIPINE BESYLATE 5 MG PO TABS
5.0000 mg | ORAL_TABLET | Freq: Every day | ORAL | Status: DC
  Filled 2020-09-08: qty 1

## 2020-09-08 MED ORDER — PROPOFOL 10 MG/ML IV BOLUS
INTRAVENOUS | Status: DC | PRN
  Administered 2020-09-08: 30 mg via INTRAVENOUS

## 2020-09-08 MED ORDER — POLYETHYLENE GLYCOL 3350 17 G PO PACK
17.0000 g | PACK | Freq: Every day | ORAL | Status: DC
  Administered 2020-09-09: 17 g via ORAL
  Filled 2020-09-08: qty 1

## 2020-09-08 MED ORDER — 0.9 % SODIUM CHLORIDE (POUR BTL) OPTIME
TOPICAL | Status: DC | PRN
  Administered 2020-09-08: 1000 mL

## 2020-09-08 MED ORDER — ONDANSETRON HCL 4 MG/2ML IJ SOLN: 4.0000 mg | Freq: Once | INTRAMUSCULAR | Status: DC | PRN

## 2020-09-08 MED ORDER — MENTHOL 3 MG MT LOZG: 1.0000 | LOZENGE | OROMUCOSAL | Status: DC | PRN

## 2020-09-08 MED ORDER — FENTANYL CITRATE (PF) 250 MCG/5ML IJ SOLN
INTRAMUSCULAR | Status: DC | PRN
  Administered 2020-09-08: 50 ug via INTRAVENOUS

## 2020-09-08 MED ORDER — TRANEXAMIC ACID 1000 MG/10ML IV SOLN
INTRAVENOUS | Status: DC | PRN
  Administered 2020-09-08: 2000 mg via TOPICAL

## 2020-09-08 MED ORDER — ROPIVACAINE HCL 7.5 MG/ML IJ SOLN
INTRAMUSCULAR | Status: DC | PRN
  Administered 2020-09-08: 20 mL via PERINEURAL

## 2020-09-08 MED ORDER — KETOROLAC TROMETHAMINE 15 MG/ML IJ SOLN
15.0000 mg | Freq: Four times a day (QID) | INTRAMUSCULAR | Status: AC
  Administered 2020-09-08: 15 mg via INTRAVENOUS
  Filled 2020-09-08 (×2): qty 1

## 2020-09-08 MED ORDER — CEFAZOLIN SODIUM-DEXTROSE 2-4 GM/100ML-% IV SOLN
2.0000 g | INTRAVENOUS | Status: AC
  Administered 2020-09-08: 2 g via INTRAVENOUS

## 2020-09-08 MED ORDER — LACTATED RINGERS IV SOLN: INTRAVENOUS | Status: DC | PRN

## 2020-09-08 MED ORDER — HYDROMORPHONE HCL 1 MG/ML IJ SOLN
0.2500 mg | INTRAMUSCULAR | Status: DC | PRN
  Administered 2020-09-08 (×2): 1 mg via INTRAVENOUS
  Filled 2020-09-08 (×2): qty 1

## 2020-09-08 MED ORDER — VANCOMYCIN HCL 1000 MG IV SOLR
INTRAVENOUS | Status: AC
  Filled 2020-09-08: qty 1000

## 2020-09-08 MED ORDER — TRANEXAMIC ACID-NACL 1000-0.7 MG/100ML-% IV SOLN
INTRAVENOUS | Status: AC
  Filled 2020-09-08: qty 100

## 2020-09-08 MED ORDER — PHENYLEPHRINE 40 MCG/ML (10ML) SYRINGE FOR IV PUSH (FOR BLOOD PRESSURE SUPPORT)
PREFILLED_SYRINGE | INTRAVENOUS | Status: AC
  Filled 2020-09-08: qty 10

## 2020-09-08 MED ORDER — ACETAMINOPHEN 325 MG PO TABS: 325.0000 mg | ORAL_TABLET | Freq: Four times a day (QID) | ORAL | Status: DC | PRN

## 2020-09-08 MED ORDER — FENTANYL CITRATE (PF) 250 MCG/5ML IJ SOLN
INTRAMUSCULAR | Status: AC
  Filled 2020-09-08: qty 5

## 2020-09-08 MED ORDER — PHENOL 1.4 % MT LIQD: 1.0000 | OROMUCOSAL | Status: DC | PRN

## 2020-09-08 MED ORDER — IRRISEPT - 450ML BOTTLE WITH 0.05% CHG IN STERILE WATER, USP 99.95% OPTIME
TOPICAL | Status: DC | PRN
  Administered 2020-09-08: 450 mL via TOPICAL

## 2020-09-08 MED ORDER — ORAL CARE MOUTH RINSE: 15.0000 mL | Freq: Once | OROMUCOSAL | Status: AC

## 2020-09-08 MED ORDER — METHOCARBAMOL 500 MG PO TABS
500.0000 mg | ORAL_TABLET | Freq: Four times a day (QID) | ORAL | Status: DC | PRN
  Administered 2020-09-08 – 2020-09-09 (×3): 500 mg via ORAL
  Filled 2020-09-08 (×3): qty 1

## 2020-09-08 MED ORDER — MIDAZOLAM HCL 2 MG/2ML IJ SOLN
INTRAMUSCULAR | Status: DC | PRN
  Administered 2020-09-08 (×2): 1 mg via INTRAVENOUS

## 2020-09-08 MED ORDER — TRANEXAMIC ACID-NACL 1000-0.7 MG/100ML-% IV SOLN
1000.0000 mg | INTRAVENOUS | Status: AC
  Administered 2020-09-08: 1000 mg via INTRAVENOUS

## 2020-09-08 MED ORDER — PROPOFOL 10 MG/ML IV BOLUS
INTRAVENOUS | Status: AC
  Filled 2020-09-08: qty 20

## 2020-09-08 MED ORDER — BUPIVACAINE-MELOXICAM ER 400-12 MG/14ML IJ SOLN
INTRAMUSCULAR | Status: DC | PRN
  Administered 2020-09-08: 400 mg

## 2020-09-08 MED ORDER — ONDANSETRON HCL 4 MG PO TABS: 4.0000 mg | ORAL_TABLET | Freq: Four times a day (QID) | ORAL | Status: DC | PRN

## 2020-09-08 MED ORDER — BUPIVACAINE-MELOXICAM ER 400-12 MG/14ML IJ SOLN
INTRAMUSCULAR | Status: AC
  Filled 2020-09-08: qty 1

## 2020-09-08 SURGICAL SUPPLY — 78 items
ALCOHOL 70% 16 OZ (MISCELLANEOUS) ×2 IMPLANT
BAG DECANTER FOR FLEXI CONT (MISCELLANEOUS) ×2 IMPLANT
BANDAGE ESMARK 6X9 LF (GAUZE/BANDAGES/DRESSINGS) IMPLANT
BLADE SAG 18X100X1.27 (BLADE) ×2 IMPLANT
BNDG ESMARK 6X9 LF (GAUZE/BANDAGES/DRESSINGS)
BOWL SMART MIX CTS (DISPOSABLE) ×2 IMPLANT
CEMENT BONE REFOBACIN R1X40 US (Cement) ×4 IMPLANT
CLSR STERI-STRIP ANTIMIC 1/2X4 (GAUZE/BANDAGES/DRESSINGS) ×4 IMPLANT
COMP FEM CMT PS STD 5 LT (Joint) ×2 IMPLANT
COMPONENT FEM CMT PS STD 5 LT (Joint) ×1 IMPLANT
COOLER ICEMAN CLASSIC (MISCELLANEOUS) ×2 IMPLANT
COVER SURGICAL LIGHT HANDLE (MISCELLANEOUS) ×2 IMPLANT
COVER WAND RF STERILE (DRAPES) IMPLANT
CUFF TOURN SGL QUICK 34 (TOURNIQUET CUFF) ×1
CUFF TOURN SGL QUICK 42 (TOURNIQUET CUFF) IMPLANT
CUFF TRNQT CYL 34X4.125X (TOURNIQUET CUFF) ×1 IMPLANT
DERMABOND ADVANCED (GAUZE/BANDAGES/DRESSINGS) ×1
DERMABOND ADVANCED .7 DNX12 (GAUZE/BANDAGES/DRESSINGS) ×1 IMPLANT
DRAPE EXTREMITY T 121X128X90 (DISPOSABLE) ×2 IMPLANT
DRAPE HALF SHEET 40X57 (DRAPES) ×2 IMPLANT
DRAPE INCISE IOBAN 66X45 STRL (DRAPES) IMPLANT
DRAPE ORTHO SPLIT 77X108 STRL (DRAPES) ×2
DRAPE POUCH INSTRU U-SHP 10X18 (DRAPES) ×2 IMPLANT
DRAPE SURG ORHT 6 SPLT 77X108 (DRAPES) ×2 IMPLANT
DRAPE U-SHAPE 47X51 STRL (DRAPES) ×4 IMPLANT
DRSG AQUACEL AG ADV 3.5X10 (GAUZE/BANDAGES/DRESSINGS) ×2 IMPLANT
DURAPREP 26ML APPLICATOR (WOUND CARE) ×6 IMPLANT
ELECT CAUTERY BLADE 6.4 (BLADE) ×2 IMPLANT
ELECT REM PT RETURN 9FT ADLT (ELECTROSURGICAL) ×2
ELECTRODE REM PT RTRN 9FT ADLT (ELECTROSURGICAL) ×1 IMPLANT
GLOVE ECLIPSE 7.0 STRL STRAW (GLOVE) ×6 IMPLANT
GLOVE SKINSENSE NS SZ7.5 (GLOVE) ×3
GLOVE SKINSENSE STRL SZ7.5 (GLOVE) ×3 IMPLANT
GLOVE SURG SYN 7.5  E (GLOVE) ×4
GLOVE SURG SYN 7.5 E (GLOVE) ×4 IMPLANT
GLOVE SURG UNDER POLY LF SZ7 (GLOVE) ×2 IMPLANT
GOWN STRL REIN XL XLG (GOWN DISPOSABLE) ×2 IMPLANT
GOWN STRL REUS W/ TWL LRG LVL3 (GOWN DISPOSABLE) ×1 IMPLANT
GOWN STRL REUS W/TWL LRG LVL3 (GOWN DISPOSABLE) ×1
HANDPIECE INTERPULSE COAX TIP (DISPOSABLE) ×1
HDLS TROCR DRIL PIN KNEE 75 (PIN) ×1
HOOD PEEL AWAY FLYTE STAYCOOL (MISCELLANEOUS) ×4 IMPLANT
INSERT ASF VE PS 13 6-7/CD LT (Insert) ×2 IMPLANT
JET LAVAGE IRRISEPT WOUND (IRRIGATION / IRRIGATOR) ×2
KIT BASIN OR (CUSTOM PROCEDURE TRAY) ×2 IMPLANT
KIT TURNOVER KIT B (KITS) ×2 IMPLANT
LAVAGE JET IRRISEPT WOUND (IRRIGATION / IRRIGATOR) ×1 IMPLANT
MANIFOLD NEPTUNE II (INSTRUMENTS) ×2 IMPLANT
MARKER SKIN DUAL TIP RULER LAB (MISCELLANEOUS) ×2 IMPLANT
NEEDLE SPNL 18GX3.5 QUINCKE PK (NEEDLE) ×4 IMPLANT
NS IRRIG 1000ML POUR BTL (IV SOLUTION) ×2 IMPLANT
PACK TOTAL JOINT (CUSTOM PROCEDURE TRAY) ×2 IMPLANT
PAD ARMBOARD 7.5X6 YLW CONV (MISCELLANEOUS) ×4 IMPLANT
PAD COLD SHLDR WRAP-ON (PAD) ×2 IMPLANT
PIN DRILL HDLS TROCAR 75 4PK (PIN) ×1 IMPLANT
SAW OSC TIP CART 19.5X105X1.3 (SAW) ×2 IMPLANT
SCREW FEMALE HEX FIX 25X2.5 (ORTHOPEDIC DISPOSABLE SUPPLIES) ×2 IMPLANT
SET HNDPC FAN SPRY TIP SCT (DISPOSABLE) ×1 IMPLANT
STAPLER VISISTAT 35W (STAPLE) IMPLANT
STEM POLY PAT PLY 29M KNEE (Knees) ×2 IMPLANT
STEM TIBIA 5 DEG SZ D L KNEE (Knees) ×1 IMPLANT
SUCTION FRAZIER HANDLE 10FR (MISCELLANEOUS) ×1
SUCTION TUBE FRAZIER 10FR DISP (MISCELLANEOUS) ×1 IMPLANT
SUT ETHILON 2 0 FS 18 (SUTURE) IMPLANT
SUT ETHILON 2 0 PSLX (SUTURE) ×6 IMPLANT
SUT MNCRL AB 4-0 PS2 18 (SUTURE) IMPLANT
SUT VIC AB 0 CT1 27 (SUTURE) ×2
SUT VIC AB 0 CT1 27XBRD ANBCTR (SUTURE) ×2 IMPLANT
SUT VIC AB 1 CTX 27 (SUTURE) ×6 IMPLANT
SUT VIC AB 2-0 CT1 27 (SUTURE) ×4
SUT VIC AB 2-0 CT1 TAPERPNT 27 (SUTURE) ×4 IMPLANT
SYR 50ML LL SCALE MARK (SYRINGE) ×4 IMPLANT
TIBIA STEM 5 DEG SZ D L KNEE (Knees) ×2 IMPLANT
TOWEL GREEN STERILE (TOWEL DISPOSABLE) ×2 IMPLANT
TOWEL GREEN STERILE FF (TOWEL DISPOSABLE) ×2 IMPLANT
TRAY CATH 16FR W/PLASTIC CATH (SET/KITS/TRAYS/PACK) IMPLANT
UNDERPAD 30X36 HEAVY ABSORB (UNDERPADS AND DIAPERS) ×2 IMPLANT
WRAP KNEE MAXI GEL POST OP (GAUZE/BANDAGES/DRESSINGS) ×2 IMPLANT

## 2020-09-08 NOTE — Op Note (Signed)
Total Knee Arthroplasty Procedure Note  Preoperative diagnosis: Left knee osteoarthritis  Postoperative diagnosis:same  Operative procedure: Left total knee arthroplasty. CPT [id number]  Surgeon: N. Eduard Roux, MD  Assist: Madalyn Rob, PA-C; necessary for the timely completion of procedure and due to complexity of procedure.  Anesthesia: Spinal, regional, local  Tourniquet time: see anesthesia record  Implants used: Zimmer persona Femur: CR 5 Tibia: D Patella: 29 mm Polyethylene: 13 mm, MC  Indication: Amy Hooper is a 70 y.o. year old female with a history of knee pain. Having failed conservative management, the patient elected to proceed with a total knee arthroplasty.  We have reviewed the risk and benefits of the surgery and they elected to proceed after voicing understanding.  Procedure:  After informed consent was obtained and understanding of the risk were voiced including but not limited to bleeding, infection, damage to surrounding structures including nerves and vessels, blood clots, leg length inequality and the failure to achieve desired results, the operative extremity was marked with verbal confirmation of the patient in the holding area.   The patient was then brought to the operating room and transported to the operating room table in the supine position.  A tourniquet was applied to the operative extremity around the upper thigh. The operative limb was then prepped and draped in the usual sterile fashion and preoperative antibiotics were administered.  A time out was performed prior to the start of surgery confirming the correct extremity, preoperative antibiotic administration, as well as team members, implants and instruments available for the case. Correct surgical site was also confirmed with preoperative radiographs. The limb was then elevated for exsanguination and the tourniquet was inflated. A midline incision was made and a standard medial  parapatellar approach was performed.  The infrapatellar fat pad was removed.  Suprapatellar synovium was removed to reveal the anterior distal femoral cortex.  A medial peel was performed to release the capsule off of the medial tibial plateau.  The patella was then everted and was prepared and sized to a 29 mm taking 8 mm.  A cover was placed on the patella for protection from retractors.  The knee was then brought into full flexion and we then turned our attention to the femur.  The cruciates were sacrificed.  Start site was drilled in the femur and the intramedullary distal femoral cutting guide was placed, set at 5 degrees valgus, taking 10 mm of distal resection. The distal cut was made. Osteophytes were then removed.  Next, the proximal tibial cutting guide was placed with appropriate slope, varus/valgus alignment and depth of resection. The proximal tibial cut was made. Gap blocks were then used to assess the extension gap and alignment, and appropriate soft tissue releases were performed. Attention was turned back to the femur, which was sized using the sizing guide to a size 5. Appropriate rotation of the femoral component was determined using epicondylar axis, Whiteside's line, and assessing the flexion gap under ligament tension. The appropriate size 4-in-1 cutting block was placed and checked with an angel wing and cuts were made. Posterior femoral osteophytes and uncapped bone were then removed with the curved osteotome.  Trial components were placed, and stability was checked in full extension, mid-flexion, and deep flexion. Proper tibial rotation was determined and marked.  The patella tracked well without a lateral release.  The femoral lugs were then drilled. Trial components were then removed and tibial preparation performed.  The tibia was sized for a size D component.  The bony surfaces were irrigated with a pulse lavage and then dried. Bone cement was vacuum mixed on the back table, and the  final components sized above were cemented into place.  Antibiotic irrigation was placed in the knee joint and soft tissues while the cement cured.  After cement had finished curing, excess cement was removed. The stability of the construct was re-evaluated throughout a range of motion and found to be acceptable. The trial liner was removed, the knee was copiously irrigated, and the knee was re-evaluated for any excess bone debris. The real polyethylene liner, 13 mm thick, was inserted and checked to ensure the locking mechanism had engaged appropriately. The tourniquet was deflated and hemostasis was achieved. The wound was irrigated with normal saline.  One gram of vancomycin powder was placed in the surgical bed.  Capsular closure was performed with a #1 vicryl, subcutaneous fat closed with a 0 vicryl suture, then subcutaneous tissue closed with interrupted 2.0 vicryl suture. The skin was then closed with a 2.0 nylon and dermabond. A sterile dressing was applied.  The patient was awakened in the operating room and taken to recovery in stable condition. All sponge, needle, and instrument counts were correct at the end of the case.  Tawanna Cooler was necessary for opening, closing, retracting, limb positioning and overall facilitation and completion of the surgery.  Position: supine  Complications: none.  Time Out: performed   Drains/Packing: none  Estimated blood loss: minimal  Returned to Recovery Room: in good condition.   Antibiotics: yes   Mechanical VTE (DVT) Prophylaxis: sequential compression devices, TED thigh-high  Chemical VTE (DVT) Prophylaxis: aspirin  Fluid Replacement  Crystalloid: see anesthesia record Blood: none  FFP: none   Specimens Removed: 1 to pathology   Sponge and Instrument Count Correct? yes   PACU: portable radiograph - knee AP and Lateral   Plan/RTC: Return in 2 weeks for wound check.   Weight Bearing/Load Lower Extremity: full   Implant Name Type  Inv. Item Serial No. Manufacturer Lot No. LRB No. Used Action  STEM POLY PAT PLY 7M KNEE - [id number] Knees STEM POLY PAT PLY 7M KNEE  ZIMMER RECON(ORTH,TRAU,BIO,SG) [id number] Left 1 Implanted  TIBIA STEM 5 DEG SZ D L KNEE - [id number] Knees TIBIA STEM 5 DEG SZ D L KNEE  ZIMMER RECON(ORTH,TRAU,BIO,SG) [id number] Left 1 Implanted  Persona Femur     ZIMMER KNEE [id number] Left 1 Implanted  CEMENT BONE REFOBACIN R1X40 [place] - [id number] Cement CEMENT BONE REFOBACIN [id number] [place]  ZIMMER RECON(ORTH,TRAU,BIO,SG) [id number] Left 2 Implanted  VIVACIT POLYETHLENE Knees   ZIMMER KNEE [id number] Left 1 Implanted    N. Eduard Roux, MD [facility] 8:55 AM

## 2020-09-08 NOTE — Anesthesia Postprocedure Evaluation (Signed)
Anesthesia Post Note  [name]  Procedure(s) Performed: LEFT TOTAL KNEE ARTHROPLASTY (Left: Knee)     Patient location during evaluation: PACU Anesthesia Type: Spinal Level of consciousness: oriented and awake and alert Pain management: pain level controlled Vital Signs Assessment: post-procedure vital signs reviewed and stable Respiratory status: spontaneous breathing, respiratory function stable and nonlabored ventilation Cardiovascular status: blood pressure returned to baseline and stable Postop Assessment: no headache, no backache, no apparent nausea or vomiting, spinal receding and patient able to bend at knees Anesthetic complications: no   No notable events documented.  Last Vitals:  Vitals:   09/08/20 1022 09/08/20 1054  BP: 121/81 135/81  Pulse: (!) 43 (!) 58  Resp: 13 18  Temp: 36.8 C (!) 36.4 C  SpO2: 96% 97%    Last Pain:  Vitals:   09/08/20 1054  TempSrc: Oral  PainSc:                  Alila Sotero A.

## 2020-09-08 NOTE — Transfer of Care (Signed)
Immediate Anesthesia Transfer of Care Note  [name]  Procedure(s) Performed: LEFT TOTAL KNEE ARTHROPLASTY (Left: Knee)  Patient Location: PACU  Anesthesia Type:Spinal  Level of Consciousness: awake, alert  and oriented  Airway & Oxygen Therapy: Patient Spontanous Breathing  Post-op Assessment: Report given to RN and Post -op Vital signs reviewed and stable  Post vital signs: Reviewed and stable  Last Vitals:  Vitals Value Taken Time  BP 116/62 09/08/20 0937  Temp    Pulse 59 09/08/20 0937  Resp 18 09/08/20 0937  SpO2 99 % 09/08/20 0937  Vitals shown include unvalidated device data.  Last Pain:  Vitals:   09/08/20 0602  TempSrc:   PainSc: 0-No pain      Patients Stated Pain Goal: 2 (90/21/11 5520)  Complications: No notable events documented.

## 2020-09-08 NOTE — Evaluation (Signed)
Physical Therapy Evaluation Patient Details Name: Amy Hooper MRN: [id number] DOB: 06-14-1950 Today's Date: 09/08/2020   History of Present Illness  Pt is a 70 y/o female s/p L TKA on 6/20. PMH includes gout, HTN, fibromyalgia, and CKD.  Clinical Impression  Pt admitted secondary to problem above with deficits below. Pt requiring min A to sit at EOB. Upon sitting, pt reporting increased nausea and dizziness and reports she felt as if she were going to pass out. Returned to supine and further mobility deferred. Notified RN. Also noted to have increased drainage on bandage over incision and leaking IV; Notified RN. Anticipate pt will progress well once feeling better. Will continue to follow acutely.     Follow Up Recommendations Follow surgeon's recommendation for DC plan and follow-up therapies    Equipment Recommendations  None recommended by PT    Recommendations for Other Services       Precautions / Restrictions Precautions Precautions: Knee Precaution Booklet Issued: No Precaution Comments: Verbally reviewed knee precautions. Restrictions Weight Bearing Restrictions: Yes LLE Weight Bearing: Weight bearing as tolerated      Mobility  Bed Mobility Overal bed mobility: Needs Assistance Bed Mobility: Supine to Sit;Sit to Supine     Supine to sit: Min assist Sit to supine: Min assist   General bed mobility comments: Min A for RLE assist. Increased time for sitting. Increased nausea in sitting and then pt reports of feeling dizzy as if she may pass out. Returned to supine and notified RN.    Transfers                 General transfer comment: Deferred secondary to nausea/dizziness  Ambulation/Gait                Stairs            Wheelchair Mobility    Modified Rankin (Stroke Patients Only)       Balance Overall balance assessment: Needs assistance Sitting-balance support: No upper extremity supported;Feet supported Sitting  balance-Leahy Scale: Fair                                       Pertinent Vitals/Pain Pain Assessment: 0-10 Pain Score: 6  Pain Location: L knee Pain Descriptors / Indicators: Aching;Operative site guarding Pain Intervention(s): Limited activity within patient's tolerance;Monitored during session;Repositioned    Home Living Family/patient expects to be discharged to:: Private residence Living Arrangements: Parent;Children (sister, parent) Available Help at Discharge: Family;Available 24 hours/day Type of Home: House Home Access: Stairs to enter Entrance Stairs-Rails: Psychiatric nurse of Steps: 6 Home Layout: One level Home Equipment: Walker - 2 wheels;Bedside commode      Prior Function Level of Independence: Independent               Hand Dominance        Extremity/Trunk Assessment   Upper Extremity Assessment Upper Extremity Assessment: Overall WFL for tasks assessed    Lower Extremity Assessment Lower Extremity Assessment: LLE deficits/detail LLE Deficits / Details: Deficits consistent with post op pain and weakness.    Cervical / Trunk Assessment Cervical / Trunk Assessment: Normal  Communication   Communication: No difficulties  Cognition Arousal/Alertness: Awake/alert Behavior During Therapy: WFL for tasks assessed/performed Overall Cognitive Status: Within Functional Limits for tasks assessed  General Comments General comments (skin integrity, edema, etc.): Noted drainage on bandage and ted hose; notified RN. Also noted IV was leaking; notified RN.    Exercises     Assessment/Plan    PT Assessment Patient needs continued PT services  PT Problem List Decreased strength;Decreased balance;Decreased activity tolerance;Decreased mobility;Decreased knowledge of use of DME;Decreased knowledge of precautions;Decreased range of motion;Pain       PT Treatment  Interventions DME instruction;Gait training;Stair training;Therapeutic activities;Functional mobility training;Therapeutic exercise;Balance training;Patient/family education    PT Goals (Current goals can be found in the Care Plan section)  Acute Rehab PT Goals Patient Stated Goal: to be independent PT Goal Formulation: With patient Time For Goal Achievement: 09/22/20 Potential to Achieve Goals: Good    Frequency 7X/week   Barriers to discharge        Co-evaluation               AM-PAC PT "6 Clicks" Mobility  Outcome Measure Help needed turning from your back to your side while in a flat bed without using bedrails?: A Little Help needed moving from lying on your back to sitting on the side of a flat bed without using bedrails?: A Little Help needed moving to and from a bed to a chair (including a wheelchair)?: A Little Help needed standing up from a chair using your arms (e.g., wheelchair or bedside chair)?: A Little Help needed to walk in hospital room?: A Little Help needed climbing 3-5 steps with a railing? : A Lot 6 Click Score: 17    End of Session   Activity Tolerance: Treatment limited secondary to medical complications (Comment) (nausea/dizziness) Patient left: in bed;with call bell/phone within reach Nurse Communication: Mobility status;Other (comment) (increased drainage at incision site; IV leaking) PT Visit Diagnosis: Difficulty in walking, not elsewhere classified (R26.2);Other abnormalities of gait and mobility (R26.89);Pain Pain - Right/Left: Left Pain - part of body: Knee    Time: 1354-1410 PT Time Calculation (min) (ACUTE ONLY): 16 min   Charges:   PT Evaluation $PT Eval Low Complexity: 1 Low          Lou Miner, DPT  Acute Rehabilitation Services  Pager: [phone] Office: [phone]   Rudean Hitt 09/08/2020, 2:32 PM

## 2020-09-08 NOTE — Anesthesia Procedure Notes (Signed)
Spinal  Start time: 09/08/2020 7:21 AM End time: 09/08/2020 7:25 AM Reason for block: surgical anesthesia Staffing Performed: anesthesiologist  Anesthesiologist: Josephine Igo, MD Preanesthetic Checklist Completed: patient identified, IV checked, site marked, risks and benefits discussed, surgical consent, monitors and equipment checked, pre-op evaluation and timeout performed Spinal Block Patient position: sitting Prep: DuraPrep Location: L3-4 Needle Needle type: Pencan  Needle gauge: 24 G Needle insertion depth: 8 cm Assessment Sensory level: T6 Events: CSF return Additional Notes Patient tolerated procedure well. Adequate sensory level.

## 2020-09-08 NOTE — Anesthesia Procedure Notes (Signed)
Procedure Name: MAC Date/Time: 09/08/2020 7:25 AM Performed by: Elenore Paddy, CRNA Pre-anesthesia Checklist: Patient identified, Emergency Drugs available, Suction available and Patient being monitored Patient Re-evaluated:Patient Re-evaluated prior to induction Oxygen Delivery Method: Simple face mask Dental Injury: Teeth and Oropharynx as per pre-operative assessment

## 2020-09-08 NOTE — H&P (Signed)
PREOPERATIVE H&P  Chief Complaint: left knee degenerative joint disease  HPI: Amy Hooper is a 70 y.o. female who presents for surgical treatment of left knee degenerative joint disease.  She denies any changes in medical history.  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     Social Determinants of Health   Financial Resource Strain: Not on file  Food Insecurity: Not on file  Transportation Needs: Not on file  Physical Activity: Not on file  Stress: Not on file  Social Connections: Not on file   Family History  Problem Relation Age of Onset   Hypothyroidism Mother    Hyperlipidemia Other    Kidney failure Father    Dementia Father    Gout Father    Arthritis Father    Prostate cancer Father    Allergies  Allergen Reactions   Cefuroxime Axetil Rash and Hives   Clarithromycin Rash and Hives   Nsaids Nausea And Vomiting   Percocet [Oxycodone-Acetaminophen] Nausea And Vomiting    Pt. Also states it makes her hallucinate   Morphine    Oxycodone-Acetaminophen Other (See Comments)     Hallucination   Tramadol Nausea And Vomiting and Other (See Comments)    Upset stomach   Prior to Admission medications   Medication Sig Start Date End Date Taking? Authorizing Provider  allopurinol (ZYLOPRIM) 100 MG tablet Take 200 mg by mouth daily. 01/20/20  Yes [provider]  amLODipine (NORVASC) 5 MG tablet Take 1 tablet (5 mg total) by mouth daily. 06/24/20  Yes Isaac Bliss, [name], MD  aspirin EC 81 MG tablet Take 1 tablet (81 mg total) by mouth 2 (two) times daily. To be taken after surgery 09/03/20   Aundra Dubin, PA-C  atorvastatin (LIPITOR) 10 MG tablet Take 1 tablet (10 mg total) by mouth at bedtime. 06/24/20  Yes Isaac Bliss, [name], MD  diclofenac Sodium (VOLTAREN) 1 % GEL Apply 1 application topically 4 (four) times daily as needed (pain).   Yes [provider]  DULoxetine (CYMBALTA) 30 MG capsule TAKE 1 CAPSULE BY MOUTH EVERY DAY Patient taking differently: Take 30 mg by mouth at bedtime. TAKE 1 CAPSULE BY MOUTH EVERY DAY 06/24/20  Yes Isaac Bliss, [name], MD  eszopiclone (LUNESTA) 2 MG TABS tablet TAKE 1 TABLET BY MOUTH AT NIGHT IMMEDIATELY  BEFORE BEDTIME 09/05/20  Yes Isaac Bliss, [name], MD  fluticasone [facility]) 50 MCG/ACT nasal spray Place 1 spray into both nostrils daily as needed for allergies.   Yes [provider]  HYDROcodone-acetaminophen (NORCO) 7.5-325 MG tablet Take 1-2 tablets by mouth every 6 (six) hours as needed for moderate pain. To be taken after surgery 09/03/20   Aundra Dubin, PA-C  losartan (COZAAR) 50 MG tablet Take 1 tablet (50 mg total) by mouth daily. 06/24/20  Yes Isaac Bliss, [name], MD  methocarbamol (ROBAXIN) 500 MG tablet Take 1 tablet (500 mg total) by mouth 2 (two) times daily as needed. To be taken after surgery 09/03/20   Aundra Dubin, PA-C  ondansetron (ZOFRAN) 4 MG tablet Take 1 tablet (4 mg total) by mouth every 6 (six) hours as needed for nausea or vomiting. 09/03/20    Aundra Dubin, PA-C  pregabalin (LYRICA) 75 MG capsule Take 1 capsule (75 mg total) by mouth 3 (three) times daily. 07/17/20  Yes Jamse Arn, MD  acetaminophen (TYLENOL) 500 MG tablet Take 1,000 mg by mouth every 8 (eight) hours as needed for moderate pain.    [provider]  diazepam (VALIUM) 2 MG tablet TAKE 1 TABLET BY MOUTH AS NEEDED FOR MRI*04/29/20* 05/20/20   [provider]  loratadine (CLARITIN) 10 MG tablet Take 10 mg by mouth daily as needed for allergies.    [provider]  Vitamin D, Ergocalciferol, (DRISDOL) 1.25 MG (50000 UNIT) CAPS capsule Take 1 capsule (50,000 Units total) by mouth every 7 (seven) days for 12 doses. 09/03/20 11/20/20  [name], MD     Positive ROS: All other systems have been reviewed and were otherwise negative with the exception of those mentioned in the HPI and as above.  Physical Exam: General: Alert, no acute distress Cardiovascular: No pedal edema Respiratory: No cyanosis, no use of accessory musculature GI: abdomen soft Skin: No lesions in the area of chief complaint Neurologic: Sensation intact distally Psychiatric: Patient is competent for consent with normal mood and affect Lymphatic: no lymphedema  MUSCULOSKELETAL: exam stable  Assessment: left knee degenerative joint disease  Plan: Plan for Procedure(s): LEFT TOTAL KNEE ARTHROPLASTY  The risks benefits and alternatives were discussed with the patient including but not limited to the risks of nonoperative treatment, versus surgical intervention including infection, bleeding, nerve injury,  blood clots, cardiopulmonary complications, morbidity, mortality, among others, and they were willing to proceed.   Preoperative templating of the joint replacement has been completed, documented, and submitted to the Operating Room personnel in order to optimize intra-operative equipment management.   Eduard Roux, MD 09/08/2020 6:10 AM

## 2020-09-08 NOTE — Discharge Instructions (Addendum)
INSTRUCTIONS AFTER JOINT REPLACEMENT   Remove items at home which could result in a fall. This includes throw rugs or furniture in walking pathways ICE to the affected joint every three hours while awake for 30 minutes at a time, for at least the first 3-5 days, and then as needed for pain and swelling.  Continue to use ice for pain and swelling. You may notice swelling that will progress down to the foot and ankle.  This is normal after surgery.  Elevate your leg when you are not up walking on it.   Continue to use the breathing machine you got in the hospital (incentive spirometer) which will help keep your temperature down.  It is common for your temperature to cycle up and down following surgery, especially at night when you are not up moving around and exerting yourself.  The breathing machine keeps your lungs expanded and your temperature down.   DIET:  As you were doing prior to hospitalization, we recommend a well-balanced diet.  DRESSING / WOUND CARE / SHOWERING  Keep the surgical dressing until follow up.  The dressing is water proof, so you can shower without any extra covering.  IF THE DRESSING FALLS OFF or the wound gets wet inside, change the dressing with sterile gauze.  Please use good hand washing techniques before changing the dressing.  Do not use any lotions or creams on the incision until instructed by your surgeon.    ACTIVITY  Increase activity slowly as tolerated, but follow the weight bearing instructions below.   No driving for 6 weeks or until further direction given by your physician.  You cannot drive while taking narcotics.  No lifting or carrying greater than 10 lbs. until further directed by your surgeon. Avoid periods of inactivity such as sitting longer than an hour when not asleep. This helps prevent blood clots.  You may return to work once you are authorized by your doctor.     WEIGHT BEARING   Weight bearing as tolerated with assist device (walker, cane,  etc) as directed, use it as long as suggested by your surgeon or therapist, typically at least 4-6 weeks.   EXERCISES  Results after joint replacement surgery are often greatly improved when you follow the exercise, range of motion and muscle strengthening exercises prescribed by your doctor. Safety measures are also important to protect the joint from further injury. Any time any of these exercises cause you to have increased pain or swelling, decrease what you are doing until you are comfortable again and then slowly increase them. If you have problems or questions, call your caregiver or physical therapist for advice.   Rehabilitation is important following a joint replacement. After just a few days of immobilization, the muscles of the leg can become weakened and shrink (atrophy).  These exercises are designed to build up the tone and strength of the thigh and leg muscles and to improve motion. Often times heat used for twenty to thirty minutes before working out will loosen up your tissues and help with improving the range of motion but do not use heat for the first two weeks following surgery (sometimes heat can increase post-operative swelling).   These exercises can be done on a training (exercise) mat, on the floor, on a table or on a bed. Use whatever works the best and is most comfortable for you.    Use music or television while you are exercising so that the exercises are a pleasant break in your   day. This will make your life better with the exercises acting as a break in your routine that you can look forward to.   Perform all exercises about fifteen times, three times per day or as directed.  You should exercise both the operative leg and the other leg as well.  Exercises include:   Quad Sets - Tighten up the muscle on the front of the thigh (Quad) and hold for 5-10 seconds.   Straight Leg Raises - With your knee straight (if you were given a brace, keep it on), lift the leg to 60  degrees, hold for 3 seconds, and slowly lower the leg.  Perform this exercise against resistance later as your leg gets stronger.  Leg Slides: Lying on your back, slowly slide your foot toward your buttocks, bending your knee up off the floor (only go as far as is comfortable). Then slowly slide your foot back down until your leg is flat on the floor again.  Angel Wings: Lying on your back spread your legs to the side as far apart as you can without causing discomfort.  Hamstring Strength:  Lying on your back, push your heel against the floor with your leg straight by tightening up the muscles of your buttocks.  Repeat, but this time bend your knee to a comfortable angle, and push your heel against the floor.  You may put a pillow under the heel to make it more comfortable if necessary.   A rehabilitation program following joint replacement surgery can speed recovery and prevent re-injury in the future due to weakened muscles. Contact your doctor or a physical therapist for more information on knee rehabilitation.    CONSTIPATION  Constipation is defined medically as fewer than three stools per week and severe constipation as less than one stool per week.  Even if you have a regular bowel pattern at home, your normal regimen is likely to be disrupted due to multiple reasons following surgery.  Combination of anesthesia, postoperative narcotics, change in appetite and fluid intake all can affect your bowels.   YOU MUST use at least one of the following options; they are listed in order of increasing strength to get the job done.  They are all available over the counter, and you may need to use some, POSSIBLY even all of these options:    Drink plenty of fluids (prune juice may be helpful) and high fiber foods Colace 100 mg by mouth twice a day  Senokot for constipation as directed and as needed Dulcolax (bisacodyl), take with full glass of water  Miralax (polyethylene glycol) once or twice a day as  needed.  If you have tried all these things and are unable to have a bowel movement in the first 3-4 days after surgery call either your surgeon or your primary doctor.    If you experience loose stools or diarrhea, hold the medications until you stool forms back up.  If your symptoms do not get better within 1 week or if they get worse, check with your doctor.  If you experience "the worst abdominal pain ever" or develop nausea or vomiting, please contact the office immediately for further recommendations for treatment.   ITCHING:  If you experience itching with your medications, try taking only a single pain pill, or even half a pain pill at a time.  You can also use Benadryl over the counter for itching or also to help with sleep.   TED HOSE STOCKINGS:  Use stockings on both   legs until for at least 2 weeks or as directed by physician office. They may be removed at night for sleeping.  MEDICATIONS:  See your medication summary on the "After Visit Summary" that nursing will review with you.  You may have some home medications which will be placed on hold until you complete the course of blood thinner medication.  It is important for you to complete the blood thinner medication as prescribed.  PRECAUTIONS:  If you experience chest pain or shortness of breath - call 911 immediately for transfer to the hospital emergency department.   If you develop a fever greater that 101 F, purulent drainage from wound, increased redness or drainage from wound, foul odor from the wound/dressing, or calf pain - CONTACT YOUR SURGEON.                                                   FOLLOW-UP APPOINTMENTS:  If you do not already have a post-op appointment, please call the office for an appointment to be seen by your surgeon.  Guidelines for how soon to be seen are listed in your "After Visit Summary", but are typically between 1-4 weeks after surgery.  OTHER INSTRUCTIONS:   Knee Replacement:  Do not place pillow  under knee, focus on keeping the knee straight while resting. CPM instructions: 0-90 degrees, 2 hours in the morning, 2 hours in the afternoon, and 2 hours in the evening. Place foam block, curve side up under heel at all times except when in CPM or when walking.  DO NOT modify, tear, cut, or change the foam block in any way.  POST-OPERATIVE OPIOID TAPER INSTRUCTIONS: It is important to wean off of your opioid medication as soon as possible. If you do not need pain medication after your surgery it is ok to stop day one. Opioids include: Codeine, Hydrocodone(Norco, Vicodin), Oxycodone(Percocet, oxycontin) and hydromorphone amongst others.  Long term and even short term use of opiods can cause: Increased pain response Dependence Constipation Depression Respiratory depression And more.  Withdrawal symptoms can include Flu like symptoms Nausea, vomiting And more Techniques to manage these symptoms Hydrate well Eat regular healthy meals Stay active Use relaxation techniques(deep breathing, meditating, yoga) Do Not substitute Alcohol to help with tapering If you have been on opioids for less than two weeks and do not have pain than it is ok to stop all together.  Plan to wean off of opioids This plan should start within one week post op of your joint replacement. Maintain the same interval or time between taking each dose and first decrease the dose.  Cut the total daily intake of opioids by one tablet each day Next start to increase the time between doses. The last dose that should be eliminated is the evening dose.   MAKE SURE YOU:  Understand these instructions.  Get help right away if you are not doing well or get worse.    Thank you for letting us be a part of your medical care team.  It is a privilege we respect greatly.  We hope these instructions will help you stay on track for a fast and full recovery!   Information on my medicine - XARELTO (Rivaroxaban)     Why was  Xarelto prescribed for you? Xarelto was prescribed for you to reduce the risk of blood clots forming   after orthopedic surgery. The medical term for these abnormal blood clots is venous thromboembolism (VTE).  What do you need to know about xarelto ? Take your Xarelto ONCE DAILY at the same time every day. You may take it either with or without food.  If you have difficulty swallowing the tablet whole, you may crush it and mix in applesauce just prior to taking your dose.  Take Xarelto exactly as prescribed by your doctor and DO NOT stop taking Xarelto without talking to the doctor who prescribed the medication.  Stopping without other VTE prevention medication to take the place of Xarelto may increase your risk of developing a clot.  After discharge, you should have regular check-up appointments with your healthcare provider that is prescribing your Xarelto.    What do you do if you miss a dose? If you miss a dose, take it as soon as you remember on the same day then continue your regularly scheduled once daily regimen the next day. Do not take two doses of Xarelto on the same day.   Important Safety Information A possible side effect of Xarelto is bleeding. You should call your healthcare provider right away if you experience any of the following: Bleeding from an injury or your nose that does not stop. Unusual colored urine (red or dark brown) or unusual colored stools (red or black). Unusual bruising for unknown reasons. A serious fall or if you hit your head (even if there is no bleeding).  Some medicines may interact with Xarelto and might increase your risk of bleeding while on Xarelto. To help avoid this, consult your healthcare provider or pharmacist prior to using any new prescription or non-prescription medications, including herbals, vitamins, non-steroidal anti-inflammatory drugs (NSAIDs) and supplements.  This website has more information on Xarelto:  www.xarelto.com.     

## 2020-09-08 NOTE — Anesthesia Procedure Notes (Signed)
Anesthesia Regional Block: Adductor canal block   Pre-Anesthetic Checklist: , timeout performed,  Correct Patient, Correct Site, Correct Laterality,  Correct Procedure, Correct Position, site marked,  Risks and benefits discussed,  Surgical consent,  Pre-op evaluation,  At surgeon's request and post-op pain management  Laterality: Left  Prep: chloraprep       Needles:  Injection technique: Single-shot  Needle Type: Echogenic Stimulator Needle     Needle Length: 10cm  Needle Gauge: 21   Needle insertion depth: 8 cm   Additional Needles:   Procedures:,,,, ultrasound used (permanent image in chart),,    Narrative:  Start time: 09/08/2020 7:08 AM End time: 09/08/2020 7:13 AM Injection made incrementally with aspirations every 5 mL.  Performed by: Personally  Anesthesiologist: Josephine Igo, MD  Additional Notes: Timeout performed. Patient sedated. Relevant anatomy ID'd using [place]. Incremental 2-76ml injection of LA with frequent aspiration. Patient tolerated procedure well.'    Left Adductor Canal Block

## 2020-09-09 ENCOUNTER — Encounter (HOSPITAL_COMMUNITY): Payer: Self-pay | Admitting: Orthopaedic Surgery

## 2020-09-09 ENCOUNTER — Telehealth: Payer: Self-pay

## 2020-09-09 ENCOUNTER — Other Ambulatory Visit: Payer: Self-pay | Admitting: Physician Assistant

## 2020-09-09 DIAGNOSIS — M1712 Unilateral primary osteoarthritis, left knee: Secondary | ICD-10-CM | POA: Diagnosis not present

## 2020-09-09 MED ORDER — SODIUM CHLORIDE 0.9 % IV BOLUS
500.0000 mL | Freq: Once | INTRAVENOUS | Status: AC
  Administered 2020-09-09: 500 mL via INTRAVENOUS

## 2020-09-09 MED ORDER — CALCIUM CARBONATE ANTACID 500 MG PO CHEW
1.0000 | CHEWABLE_TABLET | Freq: Three times a day (TID) | ORAL | Status: DC | PRN
  Administered 2020-09-09: 200 mg via ORAL
  Filled 2020-09-09: qty 1

## 2020-09-09 MED ORDER — RIVAROXABAN 10 MG PO TABS: 10.0000 mg | ORAL_TABLET | Freq: Every day | ORAL | 0 refills | Status: DC

## 2020-09-09 MED ORDER — SODIUM CHLORIDE 0.9 % IV BOLUS: 500.0000 mL | Freq: Once | INTRAVENOUS | Status: DC

## 2020-09-09 MED ORDER — RIVAROXABAN 10 MG PO TABS
10.0000 mg | ORAL_TABLET | Freq: Every day | ORAL | Status: DC
  Administered 2020-09-09: 10 mg via ORAL
  Filled 2020-09-09: qty 1

## 2020-09-09 NOTE — Progress Notes (Signed)
Physical Therapy Note  Second session done, and BPs more stable; no symptoms of dizziness;   Stair training complete; OK for dc home from PT standpoint     09/09/20 1330  Vital Signs  Patient Position (if appropriate) Orthostatic Vitals  Orthostatic Lying   BP- Lying 110/64  Pulse- Lying 51 (map 86)  Orthostatic Sitting  BP- Sitting 122/70  Pulse- Sitting 63 (map 86)  Orthostatic Standing at 0 minutes  BP- Standing at 0 minutes 125/65  Pulse- Standing at 0 minutes 66 (map 84)  Orthostatic Standing at 3 minutes  BP- Standing at 3 minutes 128/64  Pulse- Standing at 3 minutes 62 (84)    Amy Hooper, [place] Pager [phone] Office [phone]

## 2020-09-09 NOTE — Evaluation (Signed)
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 PMH includes gout, HTN, fibromyalgia, and CKD.   Clinical Impression   PTA, pt was living with her sister and was independent. Currently, pt performing ADLs and functional mobility using RW at Supervision level. Provided education on LB ADLs, toilet transfer, and shower transfer with 3n1; pt demonstrated understanding. Answered all pt questions. Recommend dc home once medically stable per physician. All acute OT needs met and will sign off. Thank you.    Follow Up Recommendations  No OT follow up    Equipment Recommendations  None recommended by OT    Recommendations for Other Services PT consult     Precautions / Restrictions Precautions Precautions: Knee Precaution Booklet Issued: Yes (comment) Precaution Comments: Verbally reviewed knee precautions. Restrictions Weight Bearing Restrictions: Yes LLE Weight Bearing: Weight bearing as tolerated      Mobility Bed Mobility Overal bed mobility: Needs Assistance Bed Mobility: Supine to Sit     Supine to sit: Supervision     General bed mobility comments: In recliner upon arrival    Transfers Overall transfer level: Needs assistance Equipment used: Rolling walker (2 wheeled) Transfers: Sit to/from Stand Sit to Stand: Supervision         General transfer comment: Cues for hand placement and safety    Balance Overall balance assessment: Needs assistance Sitting-balance support: No upper extremity supported;Feet supported Sitting balance-Leahy Scale: Good     Standing balance support: Bilateral upper extremity supported;During functional activity;No upper extremity supported Standing balance-Leahy Scale: Fair Standing balance comment: able to pull up pants without UE suppot                           ADL  either performed or assessed with clinical judgement   ADL Overall ADL's : Needs assistance/impaired                                       General ADL Comments: Providing education on compensatory techniques for LB dressing, toileting, and shower transfer. Pt performing at Supervision level. Also reinforcing the need to rest with knee straight     Vision Baseline Vision/History: Wears glasses Wears Glasses: At all times Patient Visual Report: No change from baseline       Perception     Praxis      Pertinent Vitals/Pain Pain Assessment: Faces Pain Score: 5  Faces Pain Scale: Hurts little more Pain Location: L knee Pain Descriptors / Indicators: Aching;Operative site guarding Pain Intervention(s): Monitored during session;Limited activity within patient's tolerance;Repositioned     Hand Dominance     Extremity/Trunk Assessment Upper Extremity Assessment Upper Extremity Assessment: Overall WFL for tasks assessed   Lower Extremity Assessment Lower Extremity Assessment: Defer to PT evaluation;LLE deficits/detail LLE Deficits / Details: Deficits consistent with post op pain and weakness.   Cervical / Trunk Assessment Cervical / Trunk Assessment: Normal   Communication Communication Communication: No difficulties   Cognition Arousal/Alertness: Awake/alert Behavior During Therapy: WFL for tasks assessed/performed Overall Cognitive Status: Within Functional Limits for tasks assessed  General Comments  BP sitting with BLEs elevated 110/62 (74), standing 118/70 (84), and at end of session sitting 122/60 (78)    Exercises    Shoulder Instructions      Home Living Family/patient expects to be discharged to:: Private residence Living Arrangements: Parent;Children (sister, parent) Available Help at Discharge: Family;Available 24 hours/day Type of Home: House Home Access: Stairs to enter [employer] of Steps: 6 Entrance Stairs-Rails: Right;Left Home Layout: One level     Bathroom Shower/Tub: Teacher, early years/pre: Standard     Home Equipment: Environmental consultant - 2 wheels;Bedside commode          Prior Functioning/Environment Level of Independence: Independent                 OT Problem List: Decreased activity tolerance;Impaired balance (sitting and/or standing);Decreased knowledge of use of DME or AE;Decreased knowledge of precautions      OT Treatment/Interventions:      OT Goals(Current goals can be found in the care plan section) Acute Rehab OT Goals Patient Stated Goal: Go home today OT Goal Formulation: All assessment and education complete, DC therapy  OT Frequency:     Barriers to D/C:            Co-evaluation              AM-PAC OT "6 Clicks" Daily Activity     Outcome Measure Help from another person eating meals?: None Help from another person taking care of personal grooming?: None Help from another person toileting, which includes using toliet, bedpan, or urinal?: A Little Help from another person bathing (including washing, rinsing, drying)?: A Little Help from another person to put on and taking off regular upper body clothing?: None Help from another person to put on and taking off regular lower body clothing?: A Little 6 Click Score: 21   End of Session Equipment Utilized During Treatment: Gait belt Nurse Communication: Mobility status  Activity Tolerance: Patient tolerated treatment well Patient left: in chair;with call bell/phone within reach  OT Visit Diagnosis: Other abnormalities of gait and mobility (R26.89);Muscle weakness (generalized) (M62.81)                Time: 6681-5947 OT Time Calculation (min): 20 min Charges:  OT General Charges $OT Visit: 1 Visit OT Evaluation $OT Eval Low Complexity: [place], OTR/L Acute Rehab Pager: [phone] Office: [place] 09/09/2020, 12:40 PM

## 2020-09-09 NOTE — Discharge Summary (Addendum)
Patient ID: Amy Hooper MRN: [id number] DOB/AGE: 1951-01-02 70 y.o.  Admit date: 09/08/2020 Discharge date: 09/09/2020  Admission Diagnoses:  Principal Problem:   Primary osteoarthritis of left knee Active Problems:   DJD (degenerative joint disease) of knee   Status post total left knee replacement   Discharge Diagnoses:  Same  Past Medical History:  Diagnosis Date   Chronic kidney disease    Depression    Fibromyalgia    Gout    HTN (hypertension)    Insomnia    Morbid obesity ([place])    Osteoarthritis    Palpitation    Sleep apnea     Surgeries: Procedure(s): LEFT TOTAL KNEE ARTHROPLASTY on 09/08/2020   Consultants:   Discharged Condition: Improved  Hospital Course: Amy Hooper is an 70 y.o. female who was admitted 09/08/2020 for operative treatment ofPrimary osteoarthritis of left knee. Patient has severe unremitting pain that affects sleep, daily activities, and work/hobbies. After pre-op clearance the patient was taken to the operating room on 09/08/2020 and underwent  Procedure(s): LEFT TOTAL KNEE ARTHROPLASTY.    Patient was given perioperative antibiotics:  Anti-infectives (From admission, onward)    Start     Dose/Rate Route Frequency Ordered Stop   09/08/20 1330  ceFAZolin (ANCEF) IVPB 2g/100 mL premix        2 g 200 mL/hr over 30 Minutes Intravenous Every 6 hours 09/08/20 0934 09/08/20 1838   09/08/20 0828  vancomycin (VANCOCIN) powder  Status:  Discontinued          As needed 09/08/20 0828 09/08/20 0931   09/08/20 0700  ceFAZolin (ANCEF) IVPB 2g/100 mL premix        2 g 200 mL/hr over 30 Minutes Intravenous On call to O.R. 09/08/20 5364 09/08/20 0729   09/08/20 0556  ceFAZolin (ANCEF) 2-4 GM/100ML-% IVPB       Note to Pharmacy: Wendall Mola   : cabinet override      09/08/20 0556 09/08/20 1759        Patient was given sequential compression devices, early ambulation, and chemoprophylaxis to prevent DVT.  Patient benefited  maximally from hospital stay and there were no complications.    Recent vital signs: Patient Vitals for the past 24 hrs:  BP Temp Temp src Pulse Resp SpO2  09/09/20 0505 118/67 98.3 F (36.8 C) Oral 66 16 99 %  09/08/20 2322 (!) 125/59 98 F (36.7 C) Oral (!) 59 18 99 %  09/08/20 1945 139/66 98 F (36.7 C) Oral 62 18 98 %  09/08/20 1647 -- -- -- (!) 51 -- 100 %  09/08/20 1639 134/64 97.6 F (36.4 C) -- (!) 43 16 97 %  09/08/20 1228 135/65 (!) 97.5 F (36.4 C) Oral (!) 57 16 99 %  09/08/20 1054 135/81 (!) 97.5 F (36.4 C) Oral (!) 58 18 97 %  09/08/20 1022 121/81 98.2 F (36.8 C) -- (!) 43 13 96 %  09/08/20 1007 126/77 -- -- (!) 54 11 93 %  09/08/20 0952 119/75 -- -- (!) 49 12 100 %  09/08/20 0938 116/62 97.7 F (36.5 C) -- (!) 54 12 100 %     Recent laboratory studies: No results for input(s): WBC, HGB, HCT, PLT, NA, K, CL, CO2, BUN, CREATININE, GLUCOSE, INR, CALCIUM in the last 72 hours.  Invalid input(s): PT, 2   Discharge Medications:   Allergies as of 09/09/2020       Reactions   Cefuroxime Axetil Rash, Hives   Clarithromycin Rash, Hives  Nsaids Nausea And Vomiting   Percocet [oxycodone-acetaminophen] Nausea And Vomiting   Pt. Also states it makes her hallucinate   Morphine    Oxycodone-acetaminophen Other (See Comments)   Hallucination   Tramadol Nausea And Vomiting, Other (See Comments)   Upset stomach        Medication List     STOP taking these medications    acetaminophen 500 MG tablet Commonly known as: TYLENOL   aspirin EC 81 MG tablet       TAKE these medications    allopurinol 100 MG tablet Commonly known as: ZYLOPRIM Take 200 mg by mouth daily.   amLODipine 5 MG tablet Commonly known as: NORVASC Take 1 tablet (5 mg total) by mouth daily.   atorvastatin 10 MG tablet Commonly known as: LIPITOR Take 1 tablet (10 mg total) by mouth at bedtime.   diazepam 2 MG tablet Commonly known as: VALIUM TAKE 1 TABLET BY MOUTH AS NEEDED FOR  MRI*04/29/20*   diclofenac Sodium 1 % Gel Commonly known as: VOLTAREN Apply 1 application topically 4 (four) times daily as needed (pain).   eszopiclone 2 MG Tabs tablet Commonly known as: LUNESTA TAKE 1 TABLET BY MOUTH AT NIGHT IMMEDIATELY BEFORE BEDTIME   fluticasone 50 MCG/ACT nasal spray Commonly known as: FLONASE Place 1 spray into both nostrils daily as needed for allergies.   HYDROcodone-acetaminophen 7.5-325 MG tablet Commonly known as: Norco Take 1-2 tablets by mouth every 6 (six) hours as needed for moderate pain. To be taken after surgery   loratadine 10 MG tablet Commonly known as: CLARITIN Take 10 mg by mouth daily as needed for allergies.   losartan 50 MG tablet Commonly known as: COZAAR Take 1 tablet (50 mg total) by mouth daily.   methocarbamol 500 MG tablet Commonly known as: Robaxin Take 1 tablet (500 mg total) by mouth 2 (two) times daily as needed. To be taken after surgery   ondansetron 4 MG tablet Commonly known as: Zofran Take 1 tablet (4 mg total) by mouth every 6 (six) hours as needed for nausea or vomiting.   pregabalin 75 MG capsule Commonly known as: Lyrica Take 1 capsule (75 mg total) by mouth 3 (three) times daily.   rivaroxaban 10 MG Tabs tablet Commonly known as: XARELTO Take 1 tablet (10 mg total) by mouth daily for 28 days.   Vitamin D (Ergocalciferol) 1.25 MG (50000 UNIT) Caps capsule Commonly known as: DRISDOL Take 1 capsule (50,000 Units total) by mouth every 7 (seven) days for 12 doses.       ASK your doctor about these medications    DULoxetine 30 MG capsule Commonly known as: CYMBALTA TAKE 1 CAPSULE BY MOUTH EVERY DAY               Durable Medical Equipment  (From admission, onward)           Start     Ordered   09/08/20 1044  DME Walker rolling  Once       Question Answer Comment  Walker: With 5 Inch Wheels   Patient needs a walker to treat with the following condition Status post total left knee  replacement      09/08/20 1044   09/08/20 1044  DME 3 n 1  Once        09/08/20 1044   09/08/20 1044  DME Bedside commode  Once       Question:  Patient needs a bedside commode to treat with the following condition  Answer:  Status post total left knee replacement   09/08/20 1044            Diagnostic Studies: Epidural Steroid injection  Result Date: 08/21/2020 Magnus Sinning, MD     08/22/2020  5:56 AM Lumbosacral Transforaminal Epidural Steroid Injection - Sub-Pedicular Approach with Fluoroscopic Guidance Patient: Indiana Gamero     Date of Birth: [birth year] MRN: [id number] PCP: Isaac Bliss, [name], MD     Visit Date: 08/21/2020  Universal Protocol:   Date/Time: 08/21/2020 Consent Given By: the patient Position: PRONE Additional Comments: Vital signs were monitored before and after the procedure. Patient was prepped and draped in the usual sterile fashion. The correct patient, procedure, and site was verified. Injection Procedure Details: Procedure diagnoses: Lumbar radiculopathy [M54.16]  Meds Administered: Meds ordered this encounter Medications  methylPREDNISolone acetate (DEPO-MEDROL) injection 80 mg Laterality: Bilateral Location/Site: L4-L5 Needle:5.0 in., 22 ga.  Short bevel or Quincke spinal needle Needle Placement: Transforaminal Findings:   -Comments: Excellent flow of contrast along the nerve, nerve root and into the epidural space. Procedure Details: After squaring off the end-plates to get a true AP view, the C-arm was positioned so that an oblique view of the foramen as noted above was visualized. The target area is just inferior to the "nose of the scotty dog" or sub pedicular. The soft tissues overlying this structure were infiltrated with 2-3 ml. of 1% Lidocaine without Epinephrine. The spinal needle was inserted toward the target using a "trajectory" view along the fluoroscope beam.  Under AP and lateral visualization, the needle was advanced so it did not puncture  dura and was located close the 6 O'Clock position of the pedical in AP tracterory. Biplanar projections were used to confirm position. Aspiration was confirmed to be negative for CSF and/or blood. A 1-2 ml. volume of Isovue-250 was injected and flow of contrast was noted at each level. Radiographs were obtained for documentation purposes. After attaining the desired flow of contrast documented above, a 0.5 to 1.0 ml test dose of 0.25% Marcaine was injected into each respective transforaminal space.  The patient was observed for 90 seconds post injection.  After no sensory deficits were reported, and normal lower extremity motor function was noted,   the above injectate was administered so that equal amounts of the injectate were placed at each foramen (level) into the transforaminal epidural space. Additional Comments: The patient tolerated the procedure well Dressing: 2 x 2 sterile gauze and Band-Aid  Post-procedure details: Patient was observed during the procedure. Post-procedure instructions were reviewed. Patient left the clinic in stable condition.   DG Chest 2 View  Result Date: 09/02/2020 CLINICAL DATA:  Preoperative assessment for left knee arthroplasty EXAM: CHEST - 2 VIEW COMPARISON:  None. FINDINGS: The heart size and mediastinal contours are within normal limits. Both lungs are clear. The visualized skeletal structures are unremarkable. IMPRESSION: No active cardiopulmonary disease. Electronically Signed   By: Randa Ngo M.D.   On: 09/02/2020 13:53   DG Knee Left Port  Result Date: 09/08/2020 CLINICAL DATA:  Status post left knee replacement EXAM: PORTABLE LEFT KNEE - 1-2 VIEW COMPARISON:  None. FINDINGS: The left knee demonstrates a total knee arthroplasty without evidence of hardware failure complication. There is no significant joint effusion. There is no fracture or dislocation. The alignment is anatomic. Post-surgical changes noted in the surrounding soft tissues. IMPRESSION: Interval  left total knee arthroplasty. Electronically Signed   By: Kathreen Devoid   On: 09/08/2020 10:40   XR C-ARM  NO REPORT  Result Date: 08/21/2020 Please see Notes tab for imaging impression.   Disposition: Discharge disposition: 01-Home or Self Care         Follow-up Information     Leandrew Koyanagi, MD Follow up in 2 week(s).   Specialty: Orthopedic Surgery Why: For suture removal, For wound re-check Contact information: [place] [id number] [phone]                  Signed: Aundra Dubin 09/09/2020, 7:53 AM

## 2020-09-09 NOTE — Progress Notes (Signed)
Patient is discharged from room 3C11 at this time. Alert and in stable condition. IV site d/c'd and instructions read to patient with understanding verbalized and all questions answered. Left unit via wheelchair with all belongings at side. 

## 2020-09-09 NOTE — Progress Notes (Addendum)
Subjective: 1 Day Post-Op Procedure(s) (LRB): LEFT TOTAL KNEE ARTHROPLASTY (Left) Patient reports pain as mild.  C/o nausea/vomiting yesterday.  Doing much better this am.   Objective: Vital signs in last 24 hours: Temp:  [97.5 F (36.4 C)-98.3 F (36.8 C)] 98.3 F (36.8 C) (06/21 0505) Pulse Rate:  [43-66] 66 (06/21 0505) Resp:  [11-18] 16 (06/21 0505) BP: (116-139)/(59-81) 118/67 (06/21 0505) SpO2:  [93 %-100 %] 99 % (06/21 0505)  Intake/Output from previous day: 06/20 0701 - 06/21 0700 In: 1300 [I.V.:900; IV Piggyback:400] Out: 1700 [Urine:1600; Blood:100] Intake/Output this shift: No intake/output data recorded.  No results for input(s): HGB in the last 72 hours. No results for input(s): WBC, RBC, HCT, PLT in the last 72 hours. No results for input(s): NA, K, CL, CO2, BUN, CREATININE, GLUCOSE, CALCIUM in the last 72 hours. No results for input(s): LABPT, INR in the last 72 hours.  Neurologically intact Neurovascular intact Sensation intact distally Intact pulses distally Dorsiflexion/Plantar flexion intact Incision: scant drainage No cellulitis present Compartment soft   Assessment/Plan: 1 Day Post-Op Procedure(s) (LRB): LEFT TOTAL KNEE ARTHROPLASTY (Left) Advance diet Up with therapy D/C IV fluids Discharge home with home health after second PT session WbAT LLE Will d/c asa and toradol  Xarelto ordered for dvt ppx while inpatient and once d/c Nurse to change bandage prior to d/c   Anticipated LOS equal to or greater than 2 midnights due to - Age 68 and older with one or more of the following:  - Obesity  - Expected need for hospital services (PT, OT, Nursing) required for safe  discharge  - Anticipated need for postoperative skilled nursing care or inpatient rehab  - Active co-morbidities: None OR   - Unanticipated findings during/Post Surgery: None  - Patient is a high risk of re-admission due to: None   Aundra Dubin 09/09/2020, 7:46 AM

## 2020-09-09 NOTE — Progress Notes (Signed)
Physical Therapy Treatment Patient Details Name: Amy Hooper MRN: [id number] DOB: 16-Dec-1950 Today's Date: 09/09/2020    History of Present Illness 70 y/o female s/p L TKA on 6/20. PMH includes gout, HTN, fibromyalgia, and CKD.    PT Comments    Continuing work on functional mobility and activity tolerance;  Much improved activity tolerance, without symptoms of lightheadedness, and Orthostatic BPs stable (see previous note, or See vitals flow sheet); Stair training complete, Questions solicited and answered; OK for dc home from PT standpoint   Follow Up Recommendations  Follow surgeon's recommendation for DC plan and follow-up therapies     Equipment Recommendations  None recommended by PT    Recommendations for Other Services       Precautions / Restrictions Precautions Precautions: Knee Precaution Booklet Issued: Yes (comment) Precaution Comments: Pt educated to not allow any pillow or bolster under knee for healing with optimal range of motion.  Restrictions Weight Bearing Restrictions: Yes LLE Weight Bearing: Weight bearing as tolerated    Mobility  Bed Mobility Overal bed mobility: Needs Assistance Bed Mobility: Supine to Sit     Supine to sit: Supervision     General bed mobility comments: No difficulty    Transfers Overall transfer level: Needs assistance Equipment used: Rolling walker (2 wheeled) Transfers: Sit to/from Stand Sit to Stand: Supervision         General transfer comment: Cues for hand placement and safety  Ambulation/Gait Ambulation/Gait assistance: Supervision Gait Distance (Feet): 110 Feet Assistive device: Rolling walker (2 wheeled) Gait Pattern/deviations: Step-through pattern Gait velocity: slow   General Gait Details: Cues to self-monitor for activity tolerance   Stairs Stairs: Yes Stairs assistance: Min guard Stair Management: Sideways;Step to pattern;One rail Left Number of Stairs: 5 General stair comments:  Reviewed sequence and pt performed without much difficulty   Wheelchair Mobility    Modified Rankin (Stroke Patients Only)       Balance Overall balance assessment: Needs assistance Sitting-balance support: No upper extremity supported;Feet supported Sitting balance-Leahy Scale: Good     Standing balance support: Bilateral upper extremity supported;During functional activity;No upper extremity supported Standing balance-Leahy Scale: Fair Standing balance comment: able to pull up pants without UE suppot                            Cognition Arousal/Alertness: Awake/alert Behavior During Therapy: WFL for tasks assessed/performed Overall Cognitive Status: Within Functional Limits for tasks assessed                                        Exercises      General Comments General comments (skin integrity, edema, etc.): See preious note, or doc flowsheets for Orthostatic BPs      Pertinent Vitals/Pain Pain Assessment: Faces Faces Pain Scale: Hurts a little bit Pain Location: L knee Pain Descriptors / Indicators: Aching;Operative site guarding Pain Intervention(s): Monitored during session    Home Living Family/patient expects to be discharged to:: Private residence Living Arrangements: Parent;Children (sister, parent) Available Help at Discharge: Family;Available 24 hours/day Type of Home: House Home Access: Stairs to enter Entrance Stairs-Rails: Right;Left Home Layout: One level Home Equipment: Environmental consultant - 2 wheels;Bedside commode      Prior Function Level of Independence: Independent          PT Goals (current goals can now be found in the care plan  section) Acute Rehab PT Goals Patient Stated Goal: Go home today PT Goal Formulation: With patient Time For Goal Achievement: 09/22/20 Potential to Achieve Goals: Good Progress towards PT goals: Progressing toward goals    Frequency    7X/week      PT Plan Current plan remains  appropriate    Co-evaluation              AM-PAC PT "6 Clicks" Mobility   Outcome Measure  Help needed turning from your back to your side while in a flat bed without using bedrails?: None Help needed moving from lying on your back to sitting on the side of a flat bed without using bedrails?: None Help needed moving to and from a bed to a chair (including a wheelchair)?: A Little Help needed standing up from a chair using your arms (e.g., wheelchair or bedside chair)?: A Little Help needed to walk in hospital room?: A Little Help needed climbing 3-5 steps with a railing? : A Little 6 Click Score: 20    End of Session Equipment Utilized During Treatment: Gait belt Activity Tolerance: Patient tolerated treatment well Patient left: in chair;with call bell/phone within reach Nurse Communication: Mobility status;Other (comment) (Improved BPs) PT Visit Diagnosis: Difficulty in walking, not elsewhere classified (R26.2);Other abnormalities of gait and mobility (R26.89);Pain Pain - Right/Left: Left Pain - part of body: Knee     Time: 1751-0258 PT Time Calculation (min) (ACUTE ONLY): 36 min  Charges:  $Gait Training: 8-22 mins $Therapeutic Exercise: 8-22 mins                     Roney Marion, PT  Acute Rehabilitation Services Pager [phone] Office [phone]    [name] 09/09/2020, 3:51 PM

## 2020-09-09 NOTE — Telephone Encounter (Signed)
FYI- [facility] called wanting to let Dr. Erlinda Hong know that patient's BP was low during her session and BP was put in her chart for review.

## 2020-09-09 NOTE — Progress Notes (Signed)
Physical Therapy Note  (Full Treatment note to follow)  Noting lightheadedness with extended time in upright standing and sitting;  BPs and symptomatology as follows:     09/09/20 0922 09/09/20 0923 09/09/20 0924  Vital Signs  Patient Position (if appropriate) Orthostatic Vitals  --   --   Orthostatic Sitting  BP- Sitting 105/60 (map 73) 97/60 (map 71; symptomatic for dizzy, Nausea, so took BP) 105/53 (Map 66; dizziness, nausea did not subside)  Pulse- Sitting 52 (dizzy after walking in hallway) 50 (feet up in recliner) 50 (reclined further, feet up; discussed with RN)  Orthostatic Standing at 0 minutes  BP- Standing at 0 minutes 108/64 (map 79)  --   --   Pulse- Standing at 0 minutes 62  --   --     Amy Hooper indicated that her BP is typically in the 140s over 80s;   At this point, she is at risk of syncope with upright activity;  Will continue to monitor response to activity;   Roney Marion, [place] Pager [phone] Office [phone]

## 2020-09-09 NOTE — Telephone Encounter (Signed)
Sent message to nurse about fluid bolus earlier

## 2020-09-09 NOTE — Progress Notes (Signed)
Physical Therapy Treatment Patient Details Name: Amy Hooper MRN: [id number] DOB: Aug 26, 1950 Today's Date: 09/09/2020    History of Present Illness Pt is a 70 y/o female s/p L TKA on 6/20. PMH includes gout, HTN, fibromyalgia, and CKD.    PT Comments    Continuing work on functional mobility and activity tolerance;  Session focused on functional mobility, and progressive amb; Overall her knee is moving quite well, and is nice and stable in stance; noted pt with lightheadedness and nausea with incr time in standing and upright sitting; Took serial BPS and they are in previous note and doc flowsheets; Notified RN and Ortho team; will continue to follow  Follow Up Recommendations  Follow surgeon's recommendation for DC plan and follow-up therapies     Equipment Recommendations  None recommended by PT    Recommendations for Other Services OT consult (as ordered)     Precautions / Restrictions Precautions Precautions: Knee Precaution Booklet Issued: Yes (comment) Precaution Comments: Verbally reviewed knee precautions. Restrictions Weight Bearing Restrictions: Yes LLE Weight Bearing: Weight bearing as tolerated    Mobility  Bed Mobility Overal bed mobility: Needs Assistance Bed Mobility: Supine to Sit     Supine to sit: Supervision     General bed mobility comments: Cues for technique; moving smoothly and well    Transfers Overall transfer level: Needs assistance Equipment used: Rolling walker (2 wheeled) Transfers: Sit to/from Stand Sit to Stand: Min guard         General transfer comment: Cues for hand placement and safety  Ambulation/Gait Ambulation/Gait assistance: Supervision;Min guard Gait Distance (Feet): 75 Feet Assistive device: Rolling walker (2 wheeled) Gait Pattern/deviations: Step-through pattern;Decreased step length - right;Decreased step length - left (short steps but step through pattern emerging) Gait velocity: slow   General Gait  Details: Cues to activate L quad for stance stability; noted less talkative while walking, and pt reported feeling lightheaded, which prompted to take BPs in sitting and standing   Stairs         General stair comments: Discussed and demonstrated technique and sequencing for ascending and descending stairs; pt did not perform sue to lightheadedness   Wheelchair Mobility    Modified Rankin (Stroke Patients Only)       Balance     Sitting balance-Leahy Scale: Good                                      Cognition Arousal/Alertness: Awake/alert Behavior During Therapy: WFL for tasks assessed/performed Overall Cognitive Status: Within Functional Limits for tasks assessed                                        Exercises Total Joint Exercises Quad Sets: AROM;Left;10 reps Heel Slides: AROM;Left;5 reps Straight Leg Raises: AROM;Left;5 reps    General Comments        Pertinent Vitals/Pain Pain Assessment: 0-10 Pain Score: 5  Pain Location: L knee Pain Descriptors / Indicators: Aching;Operative site guarding Pain Intervention(s): Monitored during session    Home Living                      Prior Function            PT Goals (current goals can now be found in the care plan section) Acute Rehab PT  Goals Patient Stated Goal: to be independent; home today PT Goal Formulation: With patient Time For Goal Achievement: 09/22/20 Potential to Achieve Goals: Good Progress towards PT goals: Progressing toward goals    Frequency    7X/week      PT Plan Current plan remains appropriate    Co-evaluation              AM-PAC PT "6 Clicks" Mobility   Outcome Measure  Help needed turning from your back to your side while in a flat bed without using bedrails?: None Help needed moving from lying on your back to sitting on the side of a flat bed without using bedrails?: None Help needed moving to and from a bed to a chair  (including a wheelchair)?: A Little Help needed standing up from a chair using your arms (e.g., wheelchair or bedside chair)?: A Little Help needed to walk in hospital room?: A Little Help needed climbing 3-5 steps with a railing? : A Lot 6 Click Score: 19    End of Session Equipment Utilized During Treatment: Gait belt Activity Tolerance: Other (comment) (limited by lightheadedness with upright activity) Patient left: in chair;with call bell/phone within reach;Other (comment) (Attempting breakfast) Nurse Communication: Mobility status;Other (comment) (BP response to activity) PT Visit Diagnosis: Difficulty in walking, not elsewhere classified (R26.2);Other abnormalities of gait and mobility (R26.89);Pain Pain - Right/Left: Left Pain - part of body: Knee     Time: 0819-0919 PT Time Calculation (min) (ACUTE ONLY): 60 min  Charges:  $Gait Training: 8-22 mins $Therapeutic Exercise: 8-22 mins $Therapeutic Activity: 23-37 mins                     Roney Marion, PT  Acute Rehabilitation Services Pager [phone] Office [phone]    [name] 09/09/2020, 9:44 AM

## 2020-09-15 ENCOUNTER — Encounter: Payer: Self-pay | Admitting: Orthopaedic Surgery

## 2020-09-15 ENCOUNTER — Encounter: Payer: Self-pay | Admitting: Physical Medicine and Rehabilitation

## 2020-09-19 ENCOUNTER — Other Ambulatory Visit: Payer: Self-pay | Admitting: Orthopaedic Surgery

## 2020-09-19 MED ORDER — HYDROCODONE-ACETAMINOPHEN 7.5-325 MG PO TABS: 1.0000 | ORAL_TABLET | Freq: Three times a day (TID) | ORAL | 0 refills | Status: DC | PRN

## 2020-09-20 ENCOUNTER — Other Ambulatory Visit: Payer: Self-pay | Admitting: Internal Medicine

## 2020-09-24 ENCOUNTER — Ambulatory Visit (INDEPENDENT_AMBULATORY_CARE_PROVIDER_SITE_OTHER): Payer: No Typology Code available for payment source | Admitting: Physician Assistant

## 2020-09-24 ENCOUNTER — Encounter: Payer: Self-pay | Admitting: Orthopaedic Surgery

## 2020-09-24 DIAGNOSIS — Z96652 Presence of left artificial knee joint: Secondary | ICD-10-CM

## 2020-09-24 NOTE — Progress Notes (Signed)
Post-Op Visit Note   [name]           Date of Birth: 09-19-1950           MRN: [id number] Visit Date: 09/24/2020 PCP: Isaac Bliss, [name], MD   Assessment & Plan:  Chief Complaint:  Chief Complaint  Patient presents with   Left Knee - Routine Post Op   Visit Diagnoses:  1. Hx of total knee replacement, left     Plan: Patient is a pleasant 70 year old female who comes in today 2 weeks out left total knee replacement 09/08/2020.  She has been doing well.  She has not received any home health physical therapy.  She is ambulating with a walker.  She is getting some nausea with the norco, but has not taken zofran in a few days. Examination of the left knee shows a well healing surgical incision with nylon sutures intact.  No evidence of infection.  Calf soft and non-tender.  Today, sutures were removed and Steri-Strips applied.  Formal physical therapy referral has been made.  She will let us know by the end of the week if she has not heard from physical therapy.  Dental prophylaxis reinforced.  Follow-up with [place] in 4 weeks for repeat evaluation and 2 view x-rays of the left knee.  Call with concerns or questions in meantime.  Follow-Up Instructions: Return in about 4 weeks (around 10/22/2020).   Orders:  No orders of the defined types were placed in this encounter.  No orders of the defined types were placed in this encounter.   Imaging: No new imaging  PMFS History: Patient Active Problem List   Diagnosis Date Noted   Primary osteoarthritis of left knee 09/08/2020   DJD (degenerative joint disease) of knee 09/08/2020   Status post total left knee replacement 09/08/2020   Spondylosis without myelopathy or radiculopathy, lumbar region 07/17/2020   Chronic pain syndrome 07/17/2020   Myalgia 07/17/2020   OSA (obstructive sleep apnea) 05/19/2020   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/17/2020   Sleep disturbance 04/17/2020   Chronic pain  of both shoulders 03/03/2020   Pain in both hands 03/03/2020   Primary osteoarthritis of both knees 03/03/2020   Pain in both feet 03/03/2020   History of chronic kidney disease 03/03/2020   DDD (degenerative disc disease), lumbar 02/05/2020   Vitamin D deficiency 01/09/2020   Hyperlipidemia 01/09/2020   CKD (chronic kidney disease) stage 3, GFR 30-59 ml/min ([place]) 01/09/2020   HTN (hypertension)    Morbid obesity ([place])    Gout    Fibromyalgia    Depression    Insomnia    Past Medical History:  Diagnosis Date   Chronic kidney disease    Depression    Fibromyalgia    Gout    HTN (hypertension)    Insomnia    Morbid obesity ([facility])    Osteoarthritis    Palpitation    Sleep apnea     Family History  Problem Relation Age of Onset   Hypothyroidism Mother    Hyperlipidemia Other    Kidney failure Father    Dementia Father    Gout Father    Arthritis Father    Prostate cancer Father     Past Surgical History:  Procedure Laterality Date   KNEE ARTHROSCOPY     right rotator cuff     ROTATOR CUFF REPAIR Left    TOTAL KNEE ARTHROPLASTY Left 09/08/2020   Procedure: LEFT TOTAL KNEE ARTHROPLASTY;  Surgeon: Leandrew Koyanagi, MD;  Location: [facility];  Service: Orthopedics;  Laterality: Left;   TUBAL LIGATION     Social History   Occupational History   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

## 2020-09-26 ENCOUNTER — Other Ambulatory Visit: Payer: Self-pay

## 2020-09-26 ENCOUNTER — Encounter: Payer: Self-pay | Admitting: Physical Therapy

## 2020-09-26 ENCOUNTER — Ambulatory Visit (INDEPENDENT_AMBULATORY_CARE_PROVIDER_SITE_OTHER): Payer: No Typology Code available for payment source | Admitting: Physical Therapy

## 2020-09-26 DIAGNOSIS — M25562 Pain in left knee: Secondary | ICD-10-CM

## 2020-09-26 DIAGNOSIS — R600 Localized edema: Secondary | ICD-10-CM | POA: Diagnosis not present

## 2020-09-26 DIAGNOSIS — M25662 Stiffness of left knee, not elsewhere classified: Secondary | ICD-10-CM

## 2020-09-26 DIAGNOSIS — M6281 Muscle weakness (generalized): Secondary | ICD-10-CM

## 2020-09-26 DIAGNOSIS — R2689 Other abnormalities of gait and mobility: Secondary | ICD-10-CM

## 2020-09-26 NOTE — Patient Instructions (Signed)
Access Code: [email or web address] URL: [email or web address] Date: 09/26/2020 Prepared by: Faustino Congress  Exercises Supine Quadricep Sets - 5-10 x daily - 7 x weekly - 3 sets - 10 reps Active Straight Leg Raise with Quad Set - 3-5 x daily - 7 x weekly - 1-2 sets - 10 reps Supine Heel Slide with Strap - 3-5 x daily - 7 x weekly - 1-2 sets - 10 reps Seated Knee Flexion AAROM - 3-5 x daily - 7 x weekly - 1-2 sets - 10 reps - 10 sec hold Seated Long Arc Quad - 3-5 x daily - 7 x weekly - 1-2 sets - 10 reps - 3-5 sec hold

## 2020-09-26 NOTE — Therapy (Signed)
[facility] Physical Therapy [address] [place], [place], [address] Phone: [phone]   Fax:  [phone]  Physical Therapy Evaluation  Patient Details  Name: Amy Hooper MRN: [id number] Date of Birth: [birth year] Referring Provider (PT): Aundra Dubin, [place]   Encounter Date: 09/26/2020   PT End of Session - 09/26/20 0852     Visit Number 1    Number of Visits 16    Date for PT Re-Evaluation 11/21/20    Authorization Type PHCS multiplan    PT Start Time 0848    PT Stop Time 0914    PT Time Calculation (min) 26 min    Activity Tolerance Patient tolerated treatment well    Behavior During Therapy [facility] for tasks assessed/performed             Past Medical History:  Diagnosis Date   Chronic kidney disease    Depression    Fibromyalgia    Gout    HTN (hypertension)    Insomnia    Morbid obesity ([place])    Osteoarthritis    Palpitation    Sleep apnea     Past Surgical History:  Procedure Laterality Date   KNEE ARTHROSCOPY     right rotator cuff     ROTATOR CUFF REPAIR Left    TOTAL KNEE ARTHROPLASTY Left 09/08/2020   Procedure: LEFT TOTAL KNEE ARTHROPLASTY;  Surgeon: Leandrew Koyanagi, MD;  Location: [place];  Service: Orthopedics;  Laterality: Left;   TUBAL LIGATION      There were no vitals filed for this visit.    Subjective Assessment - 09/26/20 0845     Subjective Pt is a 70 y/o female s/p Lt TKA on 09/08/20.  She is here today amb with RW.    Pertinent History Fibromyalgia, OA, DDD, gets regular steroid shots in L knee and R hip    Patient Stated Goals Increase movement, decrease pain    Currently in Pain? Yes    Pain Score 8    up to 10/10, at best 2/10   Pain Location Knee    Pain Orientation Left    Pain Descriptors / Indicators Sore    Pain Type Chronic pain;Acute pain;Surgical pain    Pain Onset More than a month ago    Pain Frequency Constant    Aggravating Factors  bending knee, prolonged positioning, walking     Pain Relieving Factors medication, ice                [facility] PT Assessment - 09/26/20 0846       Assessment   Medical Diagnosis Z96.652 (ICD-10-CM) - Hx of total knee replacement, left    Referring Provider (PT) Aundra Dubin, PA-C    Onset Date/Surgical Date 09/08/20    Hand Dominance Right    Next MD Visit 10/22/20    Prior Therapy at hospital, no HHPT      Precautions   Precautions None      Restrictions   Weight Bearing Restrictions No      Balance Screen   Has the patient fallen in the past 6 months No    Has the patient had a decrease in activity level because of a fear of falling?  No    Is the patient reluctant to leave their home because of a fear of falling?  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 [place]; used to go walking in the park with grandchildren      Observation/Other Assessments   Observations Lt knee swelling noted    Focus on Therapeutic Outcomes (FOTO)  36 (predicted 50)      Posture/Postural Control   Posture/Postural Control Postural limitations    Postural Limitations Forward head;Increased thoracic kyphosis;Anterior pelvic tilt      ROM / Strength   AROM / PROM / Strength AROM;PROM;Strength      AROM   AROM Assessment Site Knee    Right/Left Knee Left    Left Knee Extension -20   seated LAQ   Left Knee Flexion 72      PROM   PROM Assessment Site Knee    Right/Left Knee Left    Left Knee Extension -3    Left Knee Flexion 89      Strength   Overall Strength Comments  Lt knee 3-/5; quad lag present with SLR ~ 5 deg      Ambulation/Gait   Gait Pattern Decreased stance time - left;Decreased step length - right;Decreased hip/knee flexion - left;Antalgic    Gait Comments amb mod I with RW                        Objective measurements completed on examination: See above findings.               PT Education - 09/26/20 0850     Education Details HEP    Person(s) Educated Patient    Methods Explanation;Demonstration;Handout    Comprehension Verbalized understanding;Returned demonstration;Need further instruction              PT Short Term Goals - 09/26/20 0916       PT SHORT TERM GOAL #1   Title Pt will be independent with initial HEP    Time 4    Period Weeks    Status New    Target Date 10/24/20      PT SHORT TERM GOAL #2   Title Lt knee AROM improved 0-100 for improved function    Baseline -    Time 4    Period Weeks    Status New    Target Date 10/24/20      PT SHORT TERM GOAL #3   Title -    Baseline -               PT Long Term Goals - 09/26/20 0917       PT LONG TERM GOAL #1   Title Independent with final HEP    Time 8    Period Weeks    Status New    Target Date 11/21/20      PT LONG TERM GOAL #2   Title Lt knee AROM improved 0-110 for improved function    Baseline -    Time 8    Period Weeks    Status New    Target Date 11/21/20      PT LONG TERM GOAL #3  Title Amb independently without significant deviations for improved function    Baseline -    Time 8    Period Weeks    Status New    Target Date 11/21/20      PT LONG TERM GOAL #4   Title Report pain < 2/10 for improved function    Baseline -    Time 8    Period Weeks    Status New    Target Date 11/21/20      PT LONG TERM GOAL #5   Title FOTO score improved to 50 for improved function    Time 8    Period Weeks    Status New    Target Date 11/21/20                    Plan - 09/26/20 0909      Clinical Impression Statement Pt is a 70 y/o female who presents to [place] s/p Lt TKA on 09/08/20.  She demonstrates decreased strength, ROM, increased edema and pain with gait abnormalities affecting functional mobility.  Pt will benefit from PT to address deficits listed.    Personal Factors and Comorbidities Age;Comorbidity 1;Comorbidity 3+;Comorbidity 2;Past/Current Experience;Profession;Time since onset of injury/illness/exacerbation    Comorbidities Fibromyalgia, DDD, OA    Examination-Activity Limitations Sit;Stand;Transfers;Locomotion Level;Sleep;Squat;Stairs;Lift    Examination-Participation Restrictions Community Activity;Driving;Occupation    Stability/Clinical Decision Making Stable/Uncomplicated    Clinical Decision Making Low    Rehab Potential Good    PT Frequency 2x / week    PT Duration 8 weeks    PT Treatment/Interventions Aquatic Therapy;ADLs/Self Care Home Management;Electrical Stimulation;Moist Heat;Gait training;Stair training;Functional mobility training;Therapeutic activities;Therapeutic exercise;Balance training;Neuromuscular re-education;Patient/family education;Manual techniques;Passive range of motion;Dry needling;Taping;Vasopneumatic Device;Cryotherapy    PT Next Visit Plan review HEP, continue with ROM exercises and strengthening, gait training with cane, vaso PRN    PT Home Exercise Plan Access Code: [email or web address]    Consulted and Agree with Plan of Care Patient             Patient will benefit from skilled therapeutic intervention in order to improve the following deficits and impairments:  Decreased endurance, Hypomobility, Decreased activity tolerance, Decreased strength, Increased fascial restricitons, Pain, Decreased balance, Decreased mobility, Difficulty walking, Decreased range of motion, Impaired flexibility, Postural dysfunction, Abnormal gait, Increased edema, Decreased knowledge of use of DME  Visit Diagnosis: Acute pain of left knee - Plan: PT plan of care  cert/re-cert  Stiffness of left knee, not elsewhere classified - Plan: PT plan of care cert/re-cert  Muscle weakness (generalized) - Plan: PT plan of care cert/re-cert  Localized edema - Plan: PT plan of care cert/re-cert  Other abnormalities of gait and mobility - Plan: PT plan of care cert/re-cert     Problem List Patient Active Problem List   Diagnosis Date Noted   Primary osteoarthritis of left knee 09/08/2020   DJD (degenerative joint disease) of knee 09/08/2020   Status post total left knee replacement 09/08/2020   Spondylosis without myelopathy or radiculopathy, lumbar region 07/17/2020   Chronic pain syndrome 07/17/2020   Myalgia 07/17/2020   OSA (obstructive sleep apnea) 05/19/2020   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/17/2020   Sleep disturbance 04/17/2020   Chronic pain of both shoulders 03/03/2020   Pain in both hands 03/03/2020   Primary osteoarthritis of both knees 03/03/2020   Pain in both feet 03/03/2020   History of chronic kidney disease 03/03/2020   DDD (degenerative disc disease), lumbar 02/05/2020   Vitamin D deficiency 01/09/2020  Hyperlipidemia 01/09/2020   CKD (chronic kidney disease) stage 3, GFR 30-59 ml/min ([place]) 01/09/2020   HTN (hypertension)    Morbid obesity ([place])    Gout    Fibromyalgia    Depression    Insomnia       Laureen Abrahams, PT, DPT 09/26/20 9:21 AM      [facility] Physical Therapy [address] [place], [place], [address] Phone: [phone]   Fax:  [phone]  Name: Lee Kalt MRN: [id number] Date of Birth: Apr 19, 1950

## 2020-09-30 ENCOUNTER — Other Ambulatory Visit: Payer: Self-pay

## 2020-09-30 ENCOUNTER — Ambulatory Visit (INDEPENDENT_AMBULATORY_CARE_PROVIDER_SITE_OTHER): Payer: No Typology Code available for payment source | Admitting: Physical Therapy

## 2020-09-30 ENCOUNTER — Encounter: Payer: Self-pay | Admitting: Physical Therapy

## 2020-09-30 DIAGNOSIS — M79605 Pain in left leg: Secondary | ICD-10-CM

## 2020-09-30 DIAGNOSIS — R293 Abnormal posture: Secondary | ICD-10-CM

## 2020-09-30 DIAGNOSIS — M6281 Muscle weakness (generalized): Secondary | ICD-10-CM

## 2020-09-30 DIAGNOSIS — R600 Localized edema: Secondary | ICD-10-CM

## 2020-09-30 DIAGNOSIS — M79604 Pain in right leg: Secondary | ICD-10-CM

## 2020-09-30 DIAGNOSIS — M25662 Stiffness of left knee, not elsewhere classified: Secondary | ICD-10-CM | POA: Diagnosis not present

## 2020-09-30 DIAGNOSIS — M25562 Pain in left knee: Secondary | ICD-10-CM | POA: Diagnosis not present

## 2020-09-30 DIAGNOSIS — R2689 Other abnormalities of gait and mobility: Secondary | ICD-10-CM

## 2020-09-30 NOTE — Therapy (Signed)
[facility] Physical Therapy [address] [place], [place], [address] Phone: [phone]   Fax:  [phone]  Physical Therapy Treatment  Patient Details  Name: Amy Hooper MRN: [id number] Date of Birth: 03-Sep-1950 Referring Provider (PT): Aundra Dubin, [place]   Encounter Date: 09/30/2020   PT End of Session - 09/30/20 0916     Visit Number 2    Number of Visits 16    Date for PT Re-Evaluation 11/21/20    Authorization Type PHCS multiplan    PT Start Time 0848    PT Stop Time 0930    PT Time Calculation (min) 42 min    Equipment Utilized During Treatment Gait belt    Activity Tolerance Patient tolerated treatment well    Behavior During Therapy WFL for tasks assessed/performed             Past Medical History:  Diagnosis Date   Chronic kidney disease    Depression    Fibromyalgia    Gout    HTN (hypertension)    Insomnia    Morbid obesity ([place])    Osteoarthritis    Palpitation    Sleep apnea     Past Surgical History:  Procedure Laterality Date   KNEE ARTHROSCOPY     right rotator cuff     ROTATOR CUFF REPAIR Left    TOTAL KNEE ARTHROPLASTY Left 09/08/2020   Procedure: LEFT TOTAL KNEE ARTHROPLASTY;  Surgeon: Leandrew Koyanagi, MD;  Location: [place];  Service: Orthopedics;  Laterality: Left;   TUBAL LIGATION      There were no vitals filed for this visit.   Subjective Assessment - 09/30/20 0915     Subjective Pt arriving today with 8/10 pain in left knee. Pt amb with rolling walker    Pertinent History Fibromyalgia, OA, DDD, gets regular steroid shots in L knee and R hip    How long can you sit comfortably? Aggravates back; difficulty getting up    How long can you walk comfortably? Does not walk too much due to L knee pain    Patient Stated Goals Increase movement, decrease pain    Currently in Pain? Yes    Pain Score 8     Pain Location Knee    Pain Orientation Left    Pain Descriptors / Indicators Aching;Sore    Pain  Type Chronic pain;Surgical pain    Pain Onset More than a month ago                [facility] PT Assessment - 09/30/20 0001       Assessment   Medical Diagnosis Z96.652 (ICD-10-CM) - Hx of total knee replacement, left    Referring Provider (PT) Aundra Dubin, PA-C    Onset Date/Surgical Date 09/08/20    Hand Dominance Right    Next MD Visit 10/22/20      AROM   Left Knee Extension -10    Left Knee Flexion 100      PROM   Left Knee Extension -4    Left Knee Flexion 105                           [facility] Adult PT Treatment/Exercise - 09/30/20 0001       Transfers   Comments sit to stand x 10 from standard chair x 10      Knee/Hip Exercises: Aerobic   Recumbent Bike rocking back and forth x 6 minutes seat  at 7      Knee/Hip Exercises: Supine   Short Arc Quad Sets Strengthening;Left;2 sets;10 reps;Limitations    Short Arc Quad Sets Limitations holding 5 seconds    Heel Slides AROM;Left;10 reps    Bridges Strengthening;Both;10 reps;2 sets    Straight Leg Raises Strengthening;Left;2 sets;10 reps    Other Supine Knee/Hip Exercises clam shells x 10 holding 3 seconds with green theraband      Modalities   Modalities Vasopneumatic      Vasopneumatic   Number Minutes Vasopneumatic  10 minutes    Vasopnuematic Location  Knee    Vasopneumatic Pressure Medium    Vasopneumatic Temperature  34      Manual Therapy   Soft tissue mobilization PROM: left knee flexion/extension                      PT Short Term Goals - 09/30/20 0924       PT SHORT TERM GOAL #1   Title Pt will be independent with initial HEP    Status On-going      PT SHORT TERM GOAL #2   Title Lt knee AROM improved 0-100 for improved function    Baseline -10 to 100 actively on 09/30/2020    Status On-going               PT Long Term Goals - 09/26/20 0917       PT LONG TERM GOAL #1   Title Independent with final HEP    Time 8    Period Weeks    Status New     Target Date 11/21/20      PT LONG TERM GOAL #2   Title Lt knee AROM improved 0-110 for improved function    Baseline -    Time 8    Period Weeks    Status New    Target Date 11/21/20      PT LONG TERM GOAL #3   Title Amb independently without significant deviations for improved function    Baseline -    Time 8    Period Weeks    Status New    Target Date 11/21/20      PT LONG TERM GOAL #4   Title Report pain < 2/10 for improved function    Baseline -    Time 8    Period Weeks    Status New    Target Date 11/21/20      PT LONG TERM GOAL #5   Title FOTO score improved to 50 for improved function    Time 8    Period Weeks    Status New    Target Date 11/21/20                   Plan - 09/30/20 0917     Clinical Impression Statement Pt arriving to therapy reporting 8/ 10 pain in left knee. Pt reported that her pain decreased after performing exercises with less stiffness noted. Exercise focused on ROM and strengthening Pt instructed to continue the use of her CPM as long as it is at her home.Left knee flexion 100 actively and 105  passively.  Continue skilled PT to mazimize function.    Personal Factors and Comorbidities Age;Comorbidity 1;Comorbidity 3+;Comorbidity 2;Past/Current Experience;Profession;Time since onset of injury/illness/exacerbation    Comorbidities Fibromyalgia, DDD, OA    Examination-Activity Limitations Sit;Stand;Transfers;Locomotion Level;Sleep;Squat;Stairs;Lift    Examination-Participation Restrictions Community Activity;Driving;Occupation    Stability/Clinical Decision Making Stable/Uncomplicated  Rehab Potential Good    PT Frequency 2x / week    PT Duration 8 weeks    PT Treatment/Interventions Aquatic Therapy;ADLs/Self Care Home Management;Electrical Stimulation;Moist Heat;Gait training;Stair training;Functional mobility training;Therapeutic activities;Therapeutic exercise;Balance training;Neuromuscular re-education;Patient/family  education;Manual techniques;Passive range of motion;Dry needling;Taping;Vasopneumatic Device;Cryotherapy    PT Next Visit Plan continue with ROM exercises and strengthening, gait training with cane, vaso PRN    PT Home Exercise Plan Access Code: [email or web address]    Consulted and Agree with Plan of Care Patient             Patient will benefit from skilled therapeutic intervention in order to improve the following deficits and impairments:  Decreased endurance, Hypomobility, Decreased activity tolerance, Decreased strength, Increased fascial restricitons, Pain, Decreased balance, Decreased mobility, Difficulty walking, Decreased range of motion, Impaired flexibility, Postural dysfunction, Abnormal gait, Increased edema, Decreased knowledge of use of DME  Visit Diagnosis: Acute pain of left knee  Stiffness of left knee, not elsewhere classified  Muscle weakness (generalized)  Localized edema  Other abnormalities of gait and mobility  Pain in both lower extremities  Abnormal posture     Problem List Patient Active Problem List   Diagnosis Date Noted   Primary osteoarthritis of left knee 09/08/2020   DJD (degenerative joint disease) of knee 09/08/2020   Status post total left knee replacement 09/08/2020   Spondylosis without myelopathy or radiculopathy, lumbar region 07/17/2020   Chronic pain syndrome 07/17/2020   Myalgia 07/17/2020   OSA (obstructive sleep apnea) 05/19/2020   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/17/2020   Sleep disturbance 04/17/2020   Chronic pain of both shoulders 03/03/2020   Pain in both hands 03/03/2020   Primary osteoarthritis of both knees 03/03/2020   Pain in both feet 03/03/2020   History of chronic kidney disease 03/03/2020   DDD (degenerative disc disease), lumbar 02/05/2020   Vitamin D deficiency 01/09/2020   Hyperlipidemia 01/09/2020   CKD (chronic kidney disease) stage 3, GFR 30-59 ml/min ([place]) 01/09/2020   HTN (hypertension)     Morbid obesity ([place])    Gout    Fibromyalgia    Depression    Insomnia     Oretha Caprice, PT, MPT 09/30/2020, 9:29 AM  [facility] Physical Therapy [address] [place], [place], [address] Phone: [phone]   Fax:  [phone]  Name: Amy Hooper MRN: [id number] Date of Birth: 08-Sep-1950

## 2020-10-04 ENCOUNTER — Other Ambulatory Visit: Payer: Self-pay | Admitting: Physician Assistant

## 2020-10-07 ENCOUNTER — Other Ambulatory Visit: Payer: Self-pay

## 2020-10-07 ENCOUNTER — Encounter: Payer: Self-pay | Admitting: Physical Therapy

## 2020-10-07 ENCOUNTER — Ambulatory Visit (INDEPENDENT_AMBULATORY_CARE_PROVIDER_SITE_OTHER): Payer: No Typology Code available for payment source | Admitting: Physical Therapy

## 2020-10-07 DIAGNOSIS — R600 Localized edema: Secondary | ICD-10-CM | POA: Diagnosis not present

## 2020-10-07 DIAGNOSIS — M25662 Stiffness of left knee, not elsewhere classified: Secondary | ICD-10-CM | POA: Diagnosis not present

## 2020-10-07 DIAGNOSIS — M25562 Pain in left knee: Secondary | ICD-10-CM | POA: Diagnosis not present

## 2020-10-07 DIAGNOSIS — R2689 Other abnormalities of gait and mobility: Secondary | ICD-10-CM

## 2020-10-07 DIAGNOSIS — M6281 Muscle weakness (generalized): Secondary | ICD-10-CM | POA: Diagnosis not present

## 2020-10-07 NOTE — Therapy (Signed)
[facility] [address] [place], [place], [address] Phone: [phone]   Fax:  [phone]  Physical Therapy Treatment  Patient Details  Name: Amy Hooper MRN: [id number] Date of Birth: 24-Dec-1950 Referring Provider (PT): Aundra Dubin, [place]   Encounter Date: 10/07/2020   PT End of Session - 10/07/20 1155     Visit Number 3    Number of Visits 16    Date for PT Re-Evaluation 11/21/20    Authorization Type PHCS multiplan    Progress Note Due on Visit 10    PT Start Time 1145    PT Stop Time 1230    PT Time Calculation (min) 45 min    Activity Tolerance Patient tolerated treatment well    Behavior During Therapy [facility] for tasks assessed/performed             Past Medical History:  Diagnosis Date   Chronic kidney disease    Depression    Fibromyalgia    Gout    HTN (hypertension)    Insomnia    Morbid obesity ([place])    Osteoarthritis    Palpitation    Sleep apnea     Past Surgical History:  Procedure Laterality Date   KNEE ARTHROSCOPY     right rotator cuff     ROTATOR CUFF REPAIR Left    TOTAL KNEE ARTHROPLASTY Left 09/08/2020   Procedure: LEFT TOTAL KNEE ARTHROPLASTY;  Surgeon: Leandrew Koyanagi, MD;  Location: [facility];  Service: Orthopedics;  Laterality: Left;   TUBAL LIGATION      There were no vitals filed for this visit.   Subjective Assessment - 10/07/20 1153     Subjective Pt arriving today reporting 10/10 pain at night and 4/10 pain at present. Pt amb with rolling walker into clinic and brought cane to adjust.    Pertinent History Fibromyalgia, OA, DDD, gets regular steroid shots in L knee and R hip    How long can you sit comfortably? Aggravates back; difficulty getting up    How long can you walk comfortably? Does not walk too much due to L knee pain    Patient Stated Goals Increase movement, decrease pain    Currently in Pain? Yes    Pain Score 4     Pain Location Knee    Pain Orientation Left     Pain Descriptors / Indicators Aching;Sore    Pain Type Surgical pain    Pain Onset More than a month ago    Pain Frequency Constant                [facility] PT Assessment - 10/07/20 0001       Assessment   Medical Diagnosis Z96.652 (ICD-10-CM) - Hx of total knee replacement, left    Referring Provider (PT) Aundra Dubin, PA-C    Onset Date/Surgical Date 09/08/20    Hand Dominance Right    Next MD Visit 10/22/20      AROM   AROM Assessment Site Knee    Right/Left Knee Left    Left Knee Extension -10    Left Knee Flexion 100      PROM   Left Knee Extension -5    Left Knee Flexion 105                           [facility] Adult PT Treatment/Exercise - 10/07/20 0001       Transfers   Comments sit  to stand x 10 from standard chair x 10      Ambulation/Gait   Gait Comments amb with straight cane with instructions on sequencing and step length using a step trhough gait pattern. Pt's cane was adjusted to lowest level for best fit.      Knee/Hip Exercises: Stretches   Active Hamstring Stretch Both;2 reps;30 seconds    Gastroc Stretch 20 seconds;3 reps    Gastroc Stretch Limitations slant board      Knee/Hip Exercises: Aerobic   Recumbent Bike rocking back and forth x 6 minutes seat at 7      Knee/Hip Exercises: Machines for Strengthening   Total Gym Leg Press bilateral: 75# 3x10, Left LE only: 50# 3x10      Knee/Hip Exercises: Seated   Long Arc Quad Strengthening;Left;10 reps    Sit to [employer] with UE support;10 reps      Modalities   Modalities Vasopneumatic      Vasopneumatic   Number Minutes Vasopneumatic  10 minutes    Vasopnuematic Location  Knee    Vasopneumatic Pressure Medium    Vasopneumatic Temperature  34                      PT Short Term Goals - 10/07/20 1210       PT SHORT TERM GOAL #1   Title Pt will be independent with initial HEP    Status On-going      PT SHORT TERM GOAL #2   Title Lt knee AROM improved 0-100 for  improved function    Status On-going               PT Long Term Goals - 10/07/20 1211       PT LONG TERM GOAL #1   Title Independent with final HEP    Status On-going      PT LONG TERM GOAL #2   Title Lt knee AROM improved 0-110 for improved function    Status On-going      PT LONG TERM GOAL #3   Title Amb independently without significant deviations for improved function    Status On-going      PT LONG TERM GOAL #4   Title Report pain < 2/10 for improved function    Status On-going      PT LONG TERM GOAL #5   Title FOTO score improved to 50 for improved function    Status On-going                   Plan - 10/07/20 1157     Clinical Impression Statement Pt arriving to therapy reporting 10/10 pain at night and 4/10 pain at present. Pt reporting her CPM is being picked up today. Pt stating she has progressed it to 90 degrees. Pt tolerating exercises well focusing on ROM and quad strength. Gait training performed today using a straight cane. Pt instructed to begin amb around her home using the cane but contiue to use to rolling walker for community amb for safety. Continue skilled PT to maximize function.    Personal Factors and Comorbidities Age;Comorbidity 1;Comorbidity 3+;Comorbidity 2;Past/Current Experience;Profession;Time since onset of injury/illness/exacerbation    Comorbidities Fibromyalgia, DDD, OA    Examination-Activity Limitations Sit;Stand;Transfers;Locomotion Level;Sleep;Squat;Stairs;Lift    Examination-Participation Restrictions Community Activity;Driving;Occupation    Stability/Clinical Decision Making Stable/Uncomplicated    Rehab Potential Good    PT Frequency 2x / week    PT Duration 8 weeks    PT Treatment/Interventions Aquatic  Therapy;ADLs/Self Care Home Management;Electrical Stimulation;Moist Heat;Gait training;Stair training;Functional mobility training;Therapeutic activities;Therapeutic exercise;Balance training;Neuromuscular  re-education;Patient/family education;Manual techniques;Passive range of motion;Dry needling;Taping;Vasopneumatic Device;Cryotherapy    PT Next Visit Plan continue with ROM exercises and strengthening, gait training with cane, vaso PRN    PT Home Exercise Plan Access Code: [email or web address]    Consulted and Agree with Plan of Care Patient             Patient will benefit from skilled therapeutic intervention in order to improve the following deficits and impairments:  Decreased endurance, Hypomobility, Decreased activity tolerance, Decreased strength, Increased fascial restricitons, Pain, Decreased balance, Decreased mobility, Difficulty walking, Decreased range of motion, Impaired flexibility, Postural dysfunction, Abnormal gait, Increased edema, Decreased knowledge of use of DME  Visit Diagnosis: Acute pain of left knee  Stiffness of left knee, not elsewhere classified  Muscle weakness (generalized)  Localized edema  Other abnormalities of gait and mobility     Problem List Patient Active Problem List   Diagnosis Date Noted   Primary osteoarthritis of left knee 09/08/2020   DJD (degenerative joint disease) of knee 09/08/2020   Status post total left knee replacement 09/08/2020   Spondylosis without myelopathy or radiculopathy, lumbar region 07/17/2020   Chronic pain syndrome 07/17/2020   Myalgia 07/17/2020   OSA (obstructive sleep apnea) 05/19/2020   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/17/2020   Sleep disturbance 04/17/2020   Chronic pain of both shoulders 03/03/2020   Pain in both hands 03/03/2020   Primary osteoarthritis of both knees 03/03/2020   Pain in both feet 03/03/2020   History of chronic kidney disease 03/03/2020   DDD (degenerative disc disease), lumbar 02/05/2020   Vitamin D deficiency 01/09/2020   Hyperlipidemia 01/09/2020   CKD (chronic kidney disease) stage 3, GFR 30-59 ml/min ([place]) 01/09/2020   HTN (hypertension)    Morbid obesity ([place])     Gout    Fibromyalgia    Depression    Insomnia     Oretha Caprice, PT, MPT 10/07/2020, 12:24 PM  [facility] [address] [place], [place], [address] Phone: [phone]   Fax:  [phone]  Name: Flower Franko MRN: [id number] Date of Birth: 1950-12-28

## 2020-10-09 ENCOUNTER — Encounter: Payer: Self-pay | Admitting: Rehabilitative and Restorative Service Providers"

## 2020-10-09 ENCOUNTER — Ambulatory Visit (INDEPENDENT_AMBULATORY_CARE_PROVIDER_SITE_OTHER): Payer: No Typology Code available for payment source | Admitting: Rehabilitative and Restorative Service Providers"

## 2020-10-09 ENCOUNTER — Other Ambulatory Visit: Payer: Self-pay

## 2020-10-09 DIAGNOSIS — R600 Localized edema: Secondary | ICD-10-CM

## 2020-10-09 DIAGNOSIS — M6281 Muscle weakness (generalized): Secondary | ICD-10-CM

## 2020-10-09 DIAGNOSIS — M25662 Stiffness of left knee, not elsewhere classified: Secondary | ICD-10-CM | POA: Diagnosis not present

## 2020-10-09 DIAGNOSIS — M25562 Pain in left knee: Secondary | ICD-10-CM

## 2020-10-09 DIAGNOSIS — R2689 Other abnormalities of gait and mobility: Secondary | ICD-10-CM

## 2020-10-09 NOTE — Therapy (Signed)
[facility] [address] [place], [place], [address] Phone: [phone]   Fax:  [phone]  Physical Therapy Treatment  Patient Details  Name: Amy Hooper MRN: [id number] Date of Birth: June 26, 1950 Referring Provider (PT): Aundra Dubin, [place]   Encounter Date: 10/09/2020   PT End of Session - 10/09/20 1203     Visit Number 4    Number of Visits 16    Date for PT Re-Evaluation 11/21/20    Authorization Type PHCS multiplan    Progress Note Due on Visit 10    PT Start Time 1159    PT Stop Time 1225    PT Time Calculation (min) 26 min    Activity Tolerance Patient tolerated treatment well    Behavior During Therapy [facility] for tasks assessed/performed             Past Medical History:  Diagnosis Date   Chronic kidney disease    Depression    Fibromyalgia    Gout    HTN (hypertension)    Insomnia    Morbid obesity ([place])    Osteoarthritis    Palpitation    Sleep apnea     Past Surgical History:  Procedure Laterality Date   KNEE ARTHROSCOPY     right rotator cuff     ROTATOR CUFF REPAIR Left    TOTAL KNEE ARTHROPLASTY Left 09/08/2020   Procedure: LEFT TOTAL KNEE ARTHROPLASTY;  Surgeon: Leandrew Koyanagi, MD;  Location: [place];  Service: Orthopedics;  Laterality: Left;   TUBAL LIGATION      There were no vitals filed for this visit.   Subjective Assessment - 10/09/20 1200     Subjective Pt. arrived stated pain constant at 9/10.  Pt. stated pain across from of Lt knee.  "not a good day today."    Pertinent History Fibromyalgia, OA, DDD, gets regular steroid shots in L knee and R hip    How long can you sit comfortably? Aggravates back; difficulty getting up    How long can you walk comfortably? Does not walk too much due to L knee pain    Patient Stated Goals Increase movement, decrease pain    Currently in Pain? Yes    Pain Score 9     Pain Location Knee    Pain Orientation Left    Pain Descriptors / Indicators  Aching;Sore    Pain Type Surgical pain    Pain Onset More than a month ago    Pain Frequency Constant    Aggravating Factors  constant    Pain Relieving Factors not much                               [facility] Adult PT Treatment/Exercise - 10/09/20 0001       Knee/Hip Exercises: Aerobic   Nustep Lvl 5 8 mins UE/LE      Knee/Hip Exercises: Seated   Long Arc Quad Left;3 sets;10 reps   contralateral leg opposite   Sit to [employer] without UE support;10 reps   21 inch table     Manual Therapy   Soft tissue mobilization seated Lt knee mobilization c movement c IR/distraction and flexion - contralteral leg movement opposite                      PT Short Term Goals - 10/07/20 1210       PT SHORT TERM GOAL #1  Title Pt will be independent with initial HEP    Status On-going      PT SHORT TERM GOAL #2   Title Lt knee AROM improved 0-100 for improved function    Status On-going               PT Long Term Goals - 10/07/20 1211       PT LONG TERM GOAL #1   Title Independent with final HEP    Status On-going      PT LONG TERM GOAL #2   Title Lt knee AROM improved 0-110 for improved function    Status On-going      PT LONG TERM GOAL #3   Title Amb independently without significant deviations for improved function    Status On-going      PT LONG TERM GOAL #4   Title Report pain < 2/10 for improved function    Status On-going      PT LONG TERM GOAL #5   Title FOTO score improved to 50 for improved function    Status On-going                   Plan - 10/09/20 1204     Clinical Impression Statement Pt. arrived late for appointment limited intervention as well as reduction of intervention in part to severity of symptoms (preventing any progression of activity).  Due to pain symptoms, primary focus of today's intervention as focus on active and passive movement gains and reduction of severity of symptoms c such movements.  Discussed c  Pt. importance of only using medicine as instructed and utilizing ROM intervention in HEP to avoid prolonged duration sitting stiffness as well as activity modification to avoid exacerbation of symptoms from prolonged standing/WB activity if symptoms are worsened by it.    Personal Factors and Comorbidities Age;Comorbidity 1;Comorbidity 3+;Comorbidity 2;Past/Current Experience;Profession;Time since onset of injury/illness/exacerbation    Comorbidities Fibromyalgia, DDD, OA    Examination-Activity Limitations Sit;Stand;Transfers;Locomotion Level;Sleep;Squat;Stairs;Lift    Examination-Participation Restrictions Community Activity;Driving;Occupation    Stability/Clinical Decision Making Stable/Uncomplicated    Rehab Potential Good    PT Frequency 2x / week    PT Duration 8 weeks    PT Treatment/Interventions Aquatic Therapy;ADLs/Self Care Home Management;Electrical Stimulation;Moist Heat;Gait training;Stair training;Functional mobility training;Therapeutic activities;Therapeutic exercise;Balance training;Neuromuscular re-education;Patient/family education;Manual techniques;Passive range of motion;Dry needling;Taping;Vasopneumatic Device;Cryotherapy    PT Next Visit Plan Resume progression of strength, WB activity as tolerated.    PT Home Exercise Plan Access Code: [email or web address]    [email or web address] and Agree with Plan of Care Patient             Patient will benefit from skilled therapeutic intervention in order to improve the following deficits and impairments:  Decreased endurance, Hypomobility, Decreased activity tolerance, Decreased strength, Increased fascial restricitons, Pain, Decreased balance, Decreased mobility, Difficulty walking, Decreased range of motion, Impaired flexibility, Postural dysfunction, Abnormal gait, Increased edema, Decreased knowledge of use of DME  Visit Diagnosis: Acute pain of left knee  Stiffness of left knee, not elsewhere classified  Muscle weakness  (generalized)  Localized edema  Other abnormalities of gait and mobility     Problem List Patient Active Problem List   Diagnosis Date Noted   Primary osteoarthritis of left knee 09/08/2020   DJD (degenerative joint disease) of knee 09/08/2020   Status post total left knee replacement 09/08/2020   Spondylosis without myelopathy or radiculopathy, lumbar region 07/17/2020   Chronic pain syndrome 07/17/2020   Myalgia 07/17/2020   OSA (obstructive sleep  apnea) 05/19/2020   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/17/2020   Sleep disturbance 04/17/2020   Chronic pain of both shoulders 03/03/2020   Pain in both hands 03/03/2020   Primary osteoarthritis of both knees 03/03/2020   Pain in both feet 03/03/2020   History of chronic kidney disease 03/03/2020   DDD (degenerative disc disease), lumbar 02/05/2020   Vitamin D deficiency 01/09/2020   Hyperlipidemia 01/09/2020   CKD (chronic kidney disease) stage 3, GFR 30-59 ml/min ([place]) 01/09/2020   HTN (hypertension)    Morbid obesity ([place])    Gout    Fibromyalgia    Depression    Insomnia     Scot Jun, PT, DPT, OCS, ATC 10/09/20  12:21 PM    [facility] [address] [place], [place], [address] Phone: [phone]   Fax:  [phone]  Name: Amy Hooper MRN: [id number] Date of Birth: 1951/01/25

## 2020-10-13 ENCOUNTER — Ambulatory Visit (INDEPENDENT_AMBULATORY_CARE_PROVIDER_SITE_OTHER): Payer: No Typology Code available for payment source | Admitting: Physical Therapy

## 2020-10-13 ENCOUNTER — Other Ambulatory Visit: Payer: Self-pay

## 2020-10-13 ENCOUNTER — Encounter: Payer: Self-pay | Admitting: Physical Therapy

## 2020-10-13 DIAGNOSIS — R600 Localized edema: Secondary | ICD-10-CM

## 2020-10-13 DIAGNOSIS — M6281 Muscle weakness (generalized): Secondary | ICD-10-CM | POA: Diagnosis not present

## 2020-10-13 DIAGNOSIS — M25562 Pain in left knee: Secondary | ICD-10-CM | POA: Diagnosis not present

## 2020-10-13 DIAGNOSIS — M25662 Stiffness of left knee, not elsewhere classified: Secondary | ICD-10-CM

## 2020-10-13 DIAGNOSIS — R2689 Other abnormalities of gait and mobility: Secondary | ICD-10-CM

## 2020-10-13 NOTE — Therapy (Signed)
[facility] [address] [place], [place], [address] Phone: [phone]   Fax:  [phone]  Physical Therapy Treatment  Patient Details  Name: Amy Hooper MRN: [id number] Date of Birth: Sep 17, 1950 Referring Provider (PT): Aundra Dubin, [place]   Encounter Date: 10/13/2020   PT End of Session - 10/13/20 0930     Visit Number 5    Number of Visits 16    Date for PT Re-Evaluation 11/21/20    Authorization Type PHCS multiplan    Progress Note Due on Visit 10    PT Start Time 0845    PT Stop Time 0927    PT Time Calculation (min) 42 min    Activity Tolerance Patient tolerated treatment well    Behavior During Therapy [facility] for tasks assessed/performed             Past Medical History:  Diagnosis Date   Chronic kidney disease    Depression    Fibromyalgia    Gout    HTN (hypertension)    Insomnia    Morbid obesity ([place])    Osteoarthritis    Palpitation    Sleep apnea     Past Surgical History:  Procedure Laterality Date   KNEE ARTHROSCOPY     right rotator cuff     ROTATOR CUFF REPAIR Left    TOTAL KNEE ARTHROPLASTY Left 09/08/2020   Procedure: LEFT TOTAL KNEE ARTHROPLASTY;  Surgeon: Leandrew Koyanagi, MD;  Location: [place];  Service: Orthopedics;  Laterality: Left;   TUBAL LIGATION      There were no vitals filed for this visit.   Subjective Assessment - 10/13/20 0846     Subjective her back has flared up today, and Rt knee is now aggravating her.    Pertinent History Fibromyalgia, OA, DDD, gets regular steroid shots in L knee and R hip    How long can you sit comfortably? Aggravates back; difficulty getting up    How long can you walk comfortably? Does not walk too much due to L knee pain    Patient Stated Goals Increase movement, decrease pain    Pain Score 8     Pain Location Knee    Pain Orientation Left    Pain Descriptors / Indicators Aching;Sore    Pain Type Surgical pain    Pain Onset More than a month  ago    Pain Frequency Constant    Aggravating Factors  constant    Pain Relieving Factors nothing    Multiple Pain Sites Yes    Pain Score 8    Pain Location Back    Pain Orientation Lower    Pain Descriptors / Indicators Sore    Pain Type Acute pain;Chronic pain    Pain Onset In the past 7 days    Pain Frequency Constant    Aggravating Factors  sitting, standing    Pain Relieving Factors lying down    Pain Score 5    Pain Location Knee    Pain Orientation Right    Pain Descriptors / Indicators Aching;Dull   crunching   Pain Type Acute pain    Pain Onset 1 to 4 weeks ago    Pain Frequency Constant    Aggravating Factors  standing, bending    Pain Relieving Factors rest                               [facility] Adult  PT Treatment/Exercise - 10/13/20 0851       Knee/Hip Exercises: Aerobic   Nustep Lvl 5 10 mins UE/LE      Knee/Hip Exercises: Standing   SLS LLE with 1 finger UE support 5x10 sec      Knee/Hip Exercises: Seated   Long Arc Quad Left;3 sets;10 reps;Weights    Long Arc Quad Weight 5 lbs.    Other Seated Knee/Hip Exercises SLR 2 x 10; bil    Sit to [place] 10 reps;without UE support      Manual Therapy   Soft tissue mobilization Lt knee flexion in sitting to tolerance                      PT Short Term Goals - 10/07/20 1210       PT SHORT TERM GOAL #1   Title Pt will be independent with initial HEP    Status On-going      PT SHORT TERM GOAL #2   Title Lt knee AROM improved 0-100 for improved function    Status On-going               PT Long Term Goals - 10/07/20 1211       PT LONG TERM GOAL #1   Title Independent with final HEP    Status On-going      PT LONG TERM GOAL #2   Title Lt knee AROM improved 0-110 for improved function    Status On-going      PT LONG TERM GOAL #3   Title Amb independently without significant deviations for improved function    Status On-going      PT LONG TERM GOAL #4   Title  Report pain < 2/10 for improved function    Status On-going      PT LONG TERM GOAL #5   Title FOTO score improved to 50 for improved function    Status On-going                   Plan - 10/13/20 0930     Clinical Impression Statement Pt with continued reports of elevated pain in Lt knee, as well as acute flare up of chronic LBP and some Rt knee pain as well.  Hopeful that back pain with decrease over the next week, but if still elevated will recommend discussing with MD.  Tolerated session well today despite elevated pain.  Will continue to benefit from PT to maximize function.    Personal Factors and Comorbidities Age;Comorbidity 1;Comorbidity 3+;Comorbidity 2;Past/Current Experience;Profession;Time since onset of injury/illness/exacerbation    Comorbidities Fibromyalgia, DDD, OA    Examination-Activity Limitations Sit;Stand;Transfers;Locomotion Level;Sleep;Squat;Stairs;Lift    Examination-Participation Restrictions Community Activity;Driving;Occupation    Stability/Clinical Decision Making Stable/Uncomplicated    Rehab Potential Good    PT Frequency 2x / week    PT Duration 8 weeks    PT Treatment/Interventions Aquatic Therapy;ADLs/Self Care Home Management;Electrical Stimulation;Moist Heat;Gait training;Stair training;Functional mobility training;Therapeutic activities;Therapeutic exercise;Balance training;Neuromuscular re-education;Patient/family education;Manual techniques;Passive range of motion;Dry needling;Taping;Vasopneumatic Device;Cryotherapy    PT Next Visit Plan strengthening, weight bearing exercises as tolerated, measure next visit    PT Home Exercise Plan Access Code: [email or web address]    Consulted and Agree with Plan of Care Patient             Patient will benefit from skilled therapeutic intervention in order to improve the following deficits and impairments:  Decreased endurance, Hypomobility, Decreased activity tolerance, Decreased strength, Increased fascial  restricitons, Pain, Decreased  balance, Decreased mobility, Difficulty walking, Decreased range of motion, Impaired flexibility, Postural dysfunction, Abnormal gait, Increased edema, Decreased knowledge of use of DME  Visit Diagnosis: Acute pain of left knee  Stiffness of left knee, not elsewhere classified  Muscle weakness (generalized)  Localized edema  Other abnormalities of gait and mobility     Problem List Patient Active Problem List   Diagnosis Date Noted   Primary osteoarthritis of left knee 09/08/2020   DJD (degenerative joint disease) of knee 09/08/2020   Status post total left knee replacement 09/08/2020   Spondylosis without myelopathy or radiculopathy, lumbar region 07/17/2020   Chronic pain syndrome 07/17/2020   Myalgia 07/17/2020   OSA (obstructive sleep apnea) 05/19/2020   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/17/2020   Sleep disturbance 04/17/2020   Chronic pain of both shoulders 03/03/2020   Pain in both hands 03/03/2020   Primary osteoarthritis of both knees 03/03/2020   Pain in both feet 03/03/2020   History of chronic kidney disease 03/03/2020   DDD (degenerative disc disease), lumbar 02/05/2020   Vitamin D deficiency 01/09/2020   Hyperlipidemia 01/09/2020   CKD (chronic kidney disease) stage 3, GFR 30-59 ml/min ([place]) 01/09/2020   HTN (hypertension)    Morbid obesity ([place])    Gout    Fibromyalgia    Depression    Insomnia      Laureen Abrahams, PT, DPT 10/13/20 9:33 AM     [facility] [address] [place], [place], [address] Phone: [phone]   Fax:  [phone]  Name: Amy Hooper MRN: [id number] Date of Birth: 1950-12-25

## 2020-10-14 ENCOUNTER — Other Ambulatory Visit: Payer: Self-pay | Admitting: Physician Assistant

## 2020-10-14 MED ORDER — TRAMADOL HCL 50 MG PO TABS: 50.0000 mg | ORAL_TABLET | Freq: Three times a day (TID) | ORAL | 2 refills | Status: DC | PRN

## 2020-10-14 NOTE — Telephone Encounter (Signed)
Sent in tramadol as that is the next step down

## 2020-10-15 ENCOUNTER — Ambulatory Visit (INDEPENDENT_AMBULATORY_CARE_PROVIDER_SITE_OTHER): Payer: No Typology Code available for payment source | Admitting: Physical Therapy

## 2020-10-15 ENCOUNTER — Encounter: Payer: Self-pay | Admitting: Physical Therapy

## 2020-10-15 ENCOUNTER — Telehealth: Payer: Self-pay | Admitting: Family

## 2020-10-15 ENCOUNTER — Telehealth: Payer: Self-pay | Admitting: Orthopaedic Surgery

## 2020-10-15 ENCOUNTER — Other Ambulatory Visit: Payer: Self-pay

## 2020-10-15 DIAGNOSIS — M6281 Muscle weakness (generalized): Secondary | ICD-10-CM | POA: Diagnosis not present

## 2020-10-15 DIAGNOSIS — R2689 Other abnormalities of gait and mobility: Secondary | ICD-10-CM

## 2020-10-15 DIAGNOSIS — M25662 Stiffness of left knee, not elsewhere classified: Secondary | ICD-10-CM

## 2020-10-15 DIAGNOSIS — M25562 Pain in left knee: Secondary | ICD-10-CM | POA: Diagnosis not present

## 2020-10-15 DIAGNOSIS — R600 Localized edema: Secondary | ICD-10-CM

## 2020-10-15 NOTE — Telephone Encounter (Signed)
For some reason I only saw the second part of the message.  Please send her my apology.  Can she take tylenol 3?

## 2020-10-15 NOTE — Telephone Encounter (Signed)
Received medical records release form from patient/ Forwarding to [employer] today 

## 2020-10-15 NOTE — Therapy (Signed)
[facility] [address] [place], [place], [address] Phone: [phone]   Fax:  [phone]  Physical Therapy Treatment  Patient Details  Name: Amy Hooper MRN: [id number] Date of Birth: September 12, 1950 Referring Provider (PT): Aundra Dubin, [place]   Encounter Date: 10/15/2020   PT End of Session - 10/15/20 0926     Visit Number 6    Number of Visits 16    Date for PT Re-Evaluation 11/21/20    Authorization Type PHCS multiplan    Progress Note Due on Visit 10    PT Start Time 0844    PT Stop Time 0935    PT Time Calculation (min) 51 min    Activity Tolerance Patient tolerated treatment well    Behavior During Therapy [facility] for tasks assessed/performed             Past Medical History:  Diagnosis Date   Chronic kidney disease    Depression    Fibromyalgia    Gout    HTN (hypertension)    Insomnia    Morbid obesity ([place])    Osteoarthritis    Palpitation    Sleep apnea     Past Surgical History:  Procedure Laterality Date   KNEE ARTHROSCOPY     right rotator cuff     ROTATOR CUFF REPAIR Left    TOTAL KNEE ARTHROPLASTY Left 09/08/2020   Procedure: LEFT TOTAL KNEE ARTHROPLASTY;  Surgeon: Leandrew Koyanagi, MD;  Location: [place];  Service: Orthopedics;  Laterality: Left;   TUBAL LIGATION      There were no vitals filed for this visit.   Subjective Assessment - 10/15/20 0843     Subjective not using RW at home    Pertinent History Fibromyalgia, OA, DDD, gets regular steroid shots in L knee and R hip    How long can you sit comfortably? Aggravates back; difficulty getting up    How long can you walk comfortably? Does not walk too much due to L knee pain    Patient Stated Goals Increase movement, decrease pain    Currently in Pain? Yes    Pain Score 5     Pain Location Knee    Pain Orientation Left    Pain Descriptors / Indicators Aching;Sore    Pain Type Surgical pain    Pain Onset More than a month ago    Pain  Frequency Constant    Aggravating Factors  constant    Pain Relieving Factors rest    Pain Onset --    Pain Onset --                [facility] PT Assessment - 10/15/20 0844       Assessment   Medical Diagnosis Z96.652 (ICD-10-CM) - Hx of total knee replacement, left    Referring Provider (PT) Aundra Dubin, PA-C    Onset Date/Surgical Date 09/08/20    Hand Dominance Right    Next MD Visit 10/22/20      AROM   Left Knee Extension 0                           [facility] Adult PT Treatment/Exercise - 10/15/20 0844       Ambulation/Gait   Gait Comments amb with SPC mod I, negotiated curb mod I with SPC      Knee/Hip Exercises: Aerobic   Nustep Lvl 6 10 mins UE/LE      Knee/Hip  Exercises: Machines for Strengthening   Total Gym Leg Press bilateral: 87# 3x10, SL performed bil: 56# 3x10      Knee/Hip Exercises: Standing   Lateral Step Up Both;10 reps;Hand Hold: 1;Step Height: 6"    Forward Step Up Both;10 reps;Hand Hold: 1;Step Height: 6"      Knee/Hip Exercises: Seated   Long Arc Quad Left;3 sets;10 reps;Weights    Long Arc Quad Weight 5 lbs.      Vasopneumatic   Number Minutes Vasopneumatic  10 minutes    Vasopnuematic Location  Knee    Vasopneumatic Pressure Medium    Vasopneumatic Temperature  34                      PT Short Term Goals - 10/07/20 1210       PT SHORT TERM GOAL #1   Title Pt will be independent with initial HEP    Status On-going      PT SHORT TERM GOAL #2   Title Lt knee AROM improved 0-100 for improved function    Status On-going               PT Long Term Goals - 10/07/20 1211       PT LONG TERM GOAL #1   Title Independent with final HEP    Status On-going      PT LONG TERM GOAL #2   Title Lt knee AROM improved 0-110 for improved function    Status On-going      PT LONG TERM GOAL #3   Title Amb independently without significant deviations for improved function    Status On-going      PT LONG TERM  GOAL #4   Title Report pain < 2/10 for improved function    Status On-going      PT LONG TERM GOAL #5   Title FOTO score improved to 50 for improved function    Status On-going                   Plan - 10/15/20 [id number]     Clinical Impression Statement Pt safe to amb with SPC at this time demonstrating safe mobility in clinic.  Overall progressing well with PT, no able to achieve 0 deg extension.  Will see MD next week for follow up.    Personal Factors and Comorbidities Age;Comorbidity 1;Comorbidity 3+;Comorbidity 2;Past/Current Experience;Profession;Time since onset of injury/illness/exacerbation    Comorbidities Fibromyalgia, DDD, OA    Examination-Activity Limitations Sit;Stand;Transfers;Locomotion Level;Sleep;Squat;Stairs;Lift    Examination-Participation Restrictions Community Activity;Driving;Occupation    Stability/Clinical Decision Making Stable/Uncomplicated    Rehab Potential Good    PT Frequency 2x / week    PT Duration 8 weeks    PT Treatment/Interventions Aquatic Therapy;ADLs/Self Care Home Management;Electrical Stimulation;Moist Heat;Gait training;Stair training;Functional mobility training;Therapeutic activities;Therapeutic exercise;Balance training;Neuromuscular re-education;Patient/family education;Manual techniques;Passive range of motion;Dry needling;Taping;Vasopneumatic Device;Cryotherapy    PT Next Visit Plan strengthening, weight bearing exercises as tolerated, capture FOTO and measure    PT Home Exercise Plan Access Code: [email or web address]    Consulted and Agree with Plan of Care Patient             Patient will benefit from skilled therapeutic intervention in order to improve the following deficits and impairments:  Decreased endurance, Hypomobility, Decreased activity tolerance, Decreased strength, Increased fascial restricitons, Pain, Decreased balance, Decreased mobility, Difficulty walking, Decreased range of motion, Impaired flexibility, Postural  dysfunction, Abnormal gait, Increased edema, Decreased knowledge of use of DME  Visit Diagnosis:  Acute pain of left knee  Stiffness of left knee, not elsewhere classified  Muscle weakness (generalized)  Localized edema  Other abnormalities of gait and mobility     Problem List Patient Active Problem List   Diagnosis Date Noted   Primary osteoarthritis of left knee 09/08/2020   DJD (degenerative joint disease) of knee 09/08/2020   Status post total left knee replacement 09/08/2020   Spondylosis without myelopathy or radiculopathy, lumbar region 07/17/2020   Chronic pain syndrome 07/17/2020   Myalgia 07/17/2020   OSA (obstructive sleep apnea) 05/19/2020   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/17/2020   Sleep disturbance 04/17/2020   Chronic pain of both shoulders 03/03/2020   Pain in both hands 03/03/2020   Primary osteoarthritis of both knees 03/03/2020   Pain in both feet 03/03/2020   History of chronic kidney disease 03/03/2020   DDD (degenerative disc disease), lumbar 02/05/2020   Vitamin D deficiency 01/09/2020   Hyperlipidemia 01/09/2020   CKD (chronic kidney disease) stage 3, GFR 30-59 ml/min ([place]) 01/09/2020   HTN (hypertension)    Morbid obesity ([place])    Gout    Fibromyalgia    Depression    Insomnia      Laureen Abrahams, PT, DPT 10/15/20 9:40 AM    [facility] [address] [place], [place], [address] Phone: [phone]   Fax:  [phone]  Name: Amy Hooper MRN: [id number] Date of Birth: Aug 10, 1950

## 2020-10-16 NOTE — Telephone Encounter (Signed)
I called.  No answer.

## 2020-10-19 ENCOUNTER — Encounter: Payer: Self-pay | Admitting: Physical Medicine and Rehabilitation

## 2020-10-20 ENCOUNTER — Other Ambulatory Visit: Payer: Self-pay

## 2020-10-20 ENCOUNTER — Encounter: Payer: Self-pay | Admitting: Rehabilitative and Restorative Service Providers"

## 2020-10-20 ENCOUNTER — Ambulatory Visit (INDEPENDENT_AMBULATORY_CARE_PROVIDER_SITE_OTHER): Payer: No Typology Code available for payment source | Admitting: Rehabilitative and Restorative Service Providers"

## 2020-10-20 DIAGNOSIS — M25662 Stiffness of left knee, not elsewhere classified: Secondary | ICD-10-CM | POA: Diagnosis not present

## 2020-10-20 DIAGNOSIS — R2689 Other abnormalities of gait and mobility: Secondary | ICD-10-CM

## 2020-10-20 DIAGNOSIS — M6281 Muscle weakness (generalized): Secondary | ICD-10-CM | POA: Diagnosis not present

## 2020-10-20 DIAGNOSIS — M25562 Pain in left knee: Secondary | ICD-10-CM

## 2020-10-20 DIAGNOSIS — R600 Localized edema: Secondary | ICD-10-CM | POA: Diagnosis not present

## 2020-10-20 NOTE — Therapy (Signed)
[facility] [address] [place], [place], [address] Phone: [phone]   Fax:  [phone]  Physical Therapy Treatment  Patient Details  Name: Amy Hooper MRN: [id number] Date of Birth: 09-Sep-1950 Referring Provider (PT): Aundra Dubin, [place]   Encounter Date: 10/20/2020   PT End of Session - 10/20/20 0837     Visit Number 7    Number of Visits 16    Date for PT Re-Evaluation 11/21/20    Authorization Type PHCS multiplan    Progress Note Due on Visit 10    PT Start Time 0838    PT Stop Time 0928    PT Time Calculation (min) 50 min    Activity Tolerance Patient tolerated treatment well    Behavior During Therapy [facility] for tasks assessed/performed             Past Medical History:  Diagnosis Date   Chronic kidney disease    Depression    Fibromyalgia    Gout    HTN (hypertension)    Insomnia    Morbid obesity ([place])    Osteoarthritis    Palpitation    Sleep apnea     Past Surgical History:  Procedure Laterality Date   KNEE ARTHROSCOPY     right rotator cuff     ROTATOR CUFF REPAIR Left    TOTAL KNEE ARTHROPLASTY Left 09/08/2020   Procedure: LEFT TOTAL KNEE ARTHROPLASTY;  Surgeon: Leandrew Koyanagi, MD;  Location: [place];  Service: Orthopedics;  Laterality: Left;   TUBAL LIGATION      There were no vitals filed for this visit.   Subjective Assessment - 10/20/20 0847     Subjective Pt. stated she felt like her Lt knee was doing better than Rt knee.  Walking alot caused aggravation of Rt knee and Rt hip.    Pertinent History Fibromyalgia, OA, DDD, gets regular steroid shots in L knee and R hip    How long can you sit comfortably? Aggravates back; difficulty getting up    How long can you walk comfortably? Does not walk too much due to L knee pain    Patient Stated Goals Increase movement, decrease pain    Pain Score 5     Pain Location Knee    Pain Orientation Left    Pain Descriptors / Indicators Aching;Sore     Pain Type Surgical pain    Pain Onset More than a month ago    Pain Frequency Constant    Aggravating Factors  general insidious constant, walking some    Pain Relieving Factors rest                [facility] PT Assessment - 10/20/20 0001       Assessment   Medical Diagnosis Z96.652 (ICD-10-CM) - Hx of total knee replacement, left    Referring Provider (PT) Aundra Dubin, PA-C    Onset Date/Surgical Date 09/08/20    Hand Dominance Right      Observation/Other Assessments   Focus on Therapeutic Outcomes (FOTO)  update 51%                           [facility] Adult PT Treatment/Exercise - 10/20/20 0001       Neuro Re-ed    Neuro Re-ed Details  SLS Lt 30 sec x 4 c min A on bar, tandem ambulation fwd/back in bars 10 ft x 6 each way  Knee/Hip Exercises: Clinical research associate 30 seconds;3 reps;Both   incline board     Knee/Hip Exercises: Aerobic   Nustep Lvl 6 10 mins UE/LE      Knee/Hip Exercises: Machines for Strengthening   Total Gym Leg Press bilateral: 87# 3x10, SL performed bil: 56# 3x10      Knee/Hip Exercises: Standing   Lateral Step Up Step Height: 6";2 sets;10 reps;Left;Hand Hold: 1                      PT Short Term Goals - 10/07/20 1210       PT SHORT TERM GOAL #1   Title Pt will be independent with initial HEP    Status On-going      PT SHORT TERM GOAL #2   Title Lt knee AROM improved 0-100 for improved function    Status On-going               PT Long Term Goals - 10/07/20 1211       PT LONG TERM GOAL #1   Title Independent with final HEP    Status On-going      PT LONG TERM GOAL #2   Title Lt knee AROM improved 0-110 for improved function    Status On-going      PT LONG TERM GOAL #3   Title Amb independently without significant deviations for improved function    Status On-going      PT LONG TERM GOAL #4   Title Report pain < 2/10 for improved function    Status On-going      PT LONG TERM GOAL  #5   Title FOTO score improved to 50 for improved function    Status On-going                   Plan - 10/20/20 [id number]     Clinical Impression Statement FOTO updated showed noted improvement compared to evaluation assessment.  Pt. continued to use SPC at this time in clinic and reported use at home.  Lt leg performance in fuctional activity appears to be transitioning to be easier than Rt at this time.    Personal Factors and Comorbidities Age;Comorbidity 1;Comorbidity 3+;Comorbidity 2;Past/Current Experience;Profession;Time since onset of injury/illness/exacerbation    Comorbidities Fibromyalgia, DDD, OA    Examination-Activity Limitations Sit;Stand;Transfers;Locomotion Level;Sleep;Squat;Stairs;Lift    Examination-Participation Restrictions Community Activity;Driving;Occupation    Stability/Clinical Decision Making Stable/Uncomplicated    Rehab Potential Good    PT Frequency 2x / week    PT Duration 8 weeks    PT Treatment/Interventions Aquatic Therapy;ADLs/Self Care Home Management;Electrical Stimulation;Moist Heat;Gait training;Stair training;Functional mobility training;Therapeutic activities;Therapeutic exercise;Balance training;Neuromuscular re-education;Patient/family education;Manual techniques;Passive range of motion;Dry needling;Taping;Vasopneumatic Device;Cryotherapy    PT Next Visit Plan Reassessment progress note for MD visit after next visit. (FOTO was completed today)    PT Home Exercise Plan Access Code: [email or web address] and Agree with Plan of Care Patient             Patient will benefit from skilled therapeutic intervention in order to improve the following deficits and impairments:  Decreased endurance, Hypomobility, Decreased activity tolerance, Decreased strength, Increased fascial restricitons, Pain, Decreased balance, Decreased mobility, Difficulty walking, Decreased range of motion, Impaired flexibility, Postural dysfunction, Abnormal gait, Increased  edema, Decreased knowledge of use of DME  Visit Diagnosis: Acute pain of left knee  Stiffness of left knee, not elsewhere classified  Muscle weakness (generalized)  Localized edema  Other abnormalities of  gait and mobility     Problem List Patient Active Problem List   Diagnosis Date Noted   Primary osteoarthritis of left knee 09/08/2020   DJD (degenerative joint disease) of knee 09/08/2020   Status post total left knee replacement 09/08/2020   Spondylosis without myelopathy or radiculopathy, lumbar region 07/17/2020   Chronic pain syndrome 07/17/2020   Myalgia 07/17/2020   OSA (obstructive sleep apnea) 05/19/2020   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/17/2020   Sleep disturbance 04/17/2020   Chronic pain of both shoulders 03/03/2020   Pain in both hands 03/03/2020   Primary osteoarthritis of both knees 03/03/2020   Pain in both feet 03/03/2020   History of chronic kidney disease 03/03/2020   DDD (degenerative disc disease), lumbar 02/05/2020   Vitamin D deficiency 01/09/2020   Hyperlipidemia 01/09/2020   CKD (chronic kidney disease) stage 3, GFR 30-59 ml/min ([employer]) 01/09/2020   HTN (hypertension)    Morbid obesity ([place])    Gout    Fibromyalgia    Depression    Insomnia     Scot Jun, PT, DPT, OCS, ATC 10/20/20  9:19 AM    [facility] [address] [place], [place], [address] Phone: [phone]   Fax:  [phone]  Name: Amy Hooper MRN: [id number] Date of Birth: Sep 07, 1950

## 2020-10-22 ENCOUNTER — Ambulatory Visit (INDEPENDENT_AMBULATORY_CARE_PROVIDER_SITE_OTHER): Payer: No Typology Code available for payment source

## 2020-10-22 ENCOUNTER — Ambulatory Visit (INDEPENDENT_AMBULATORY_CARE_PROVIDER_SITE_OTHER): Payer: No Typology Code available for payment source | Admitting: Physical Therapy

## 2020-10-22 ENCOUNTER — Other Ambulatory Visit: Payer: Self-pay

## 2020-10-22 ENCOUNTER — Ambulatory Visit (INDEPENDENT_AMBULATORY_CARE_PROVIDER_SITE_OTHER): Payer: No Typology Code available for payment source | Admitting: Orthopaedic Surgery

## 2020-10-22 ENCOUNTER — Encounter: Payer: Self-pay | Admitting: Orthopaedic Surgery

## 2020-10-22 ENCOUNTER — Encounter: Payer: Self-pay | Admitting: Physical Therapy

## 2020-10-22 DIAGNOSIS — M25662 Stiffness of left knee, not elsewhere classified: Secondary | ICD-10-CM

## 2020-10-22 DIAGNOSIS — R600 Localized edema: Secondary | ICD-10-CM | POA: Diagnosis not present

## 2020-10-22 DIAGNOSIS — Z96652 Presence of left artificial knee joint: Secondary | ICD-10-CM

## 2020-10-22 DIAGNOSIS — M6281 Muscle weakness (generalized): Secondary | ICD-10-CM | POA: Diagnosis not present

## 2020-10-22 DIAGNOSIS — M25562 Pain in left knee: Secondary | ICD-10-CM

## 2020-10-22 DIAGNOSIS — R2689 Other abnormalities of gait and mobility: Secondary | ICD-10-CM

## 2020-10-22 NOTE — Therapy (Signed)
[facility] [address] [place], [place], [address] Phone: [phone]   Fax:  [phone]  Physical Therapy Treatment/Progress Note Progress Note Reporting Period 09/26/20 to 10/22/20   See note below for Objective Data and Assessment of Progress/Goals.      Patient Details  Name: Amy Hooper MRN: [id number] Date of Birth: [birth year] Referring Provider (PT): Aundra Dubin, [place]   Encounter Date: 10/22/2020   PT End of Session - 10/22/20 0932     Visit Number 8    Number of Visits 16    Date for PT Re-Evaluation 11/21/20    Authorization Type PHCS multiplan    Progress Note Due on Visit 18    PT Start Time 0845    PT Stop Time 0935    PT Time Calculation (min) 50 min    Activity Tolerance Patient tolerated treatment well    Behavior During Therapy [facility] for tasks assessed/performed             Past Medical History:  Diagnosis Date   Chronic kidney disease    Depression    Fibromyalgia    Gout    HTN (hypertension)    Insomnia    Morbid obesity ([place])    Osteoarthritis    Palpitation    Sleep apnea     Past Surgical History:  Procedure Laterality Date   KNEE ARTHROSCOPY     right rotator cuff     ROTATOR CUFF REPAIR Left    TOTAL KNEE ARTHROPLASTY Left 09/08/2020   Procedure: LEFT TOTAL KNEE ARTHROPLASTY;  Surgeon: Leandrew Koyanagi, MD;  Location: [facility];  Service: Orthopedics;  Laterality: Left;   TUBAL LIGATION      There were no vitals filed for this visit.   Subjective Assessment - 10/22/20 0847     Subjective Rt knee is more uncomfortable than Lt knee today    Pertinent History Fibromyalgia, OA, DDD, gets regular steroid shots in L knee and R hip    How long can you sit comfortably? Aggravates back; difficulty getting up    How long can you walk comfortably? Does not walk too much due to L knee pain    Patient Stated Goals Increase movement, decrease pain    Currently in Pain? Yes    Pain Score 4      Pain Location Knee    Pain Orientation Left    Pain Descriptors / Indicators Aching;Sore    Pain Type Surgical pain    Pain Onset More than a month ago    Pain Frequency Constant    Aggravating Factors  constant, walking    Pain Relieving Factors rest    Multiple Pain Sites Yes    Pain Score 9    Pain Location Knee    Pain Orientation Right    Pain Descriptors / Indicators Sore    Pain Type Acute pain;Chronic pain    Pain Onset 1 to 4 weeks ago    Pain Frequency Constant    Aggravating Factors  sitting, standing    Pain Relieving Factors lying down                [facility] PT Assessment - 10/22/20 0851       Assessment   Medical Diagnosis Z96.652 (ICD-10-CM) - Hx of total knee replacement, left    Referring Provider (PT) Aundra Dubin, PA-C    Onset Date/Surgical Date 09/08/20      Observation/Other Assessments   Focus on Therapeutic  Outcomes (FOTO)  51      AROM   Left Knee Extension 0    Left Knee Flexion 118                           [facility] Adult PT Treatment/Exercise - 10/22/20 0850       Knee/Hip Exercises: Aerobic   Nustep Lvl 6 10 mins UE/LE      Knee/Hip Exercises: Machines for Strengthening   Total Gym Leg Press bilateral: 100# 3x10, SL performed bil: 56# 3x10      Knee/Hip Exercises: Seated   Long Arc Quad Left;3 sets;10 reps;Weights    Long Arc Quad Weight --   7.5     Vasopneumatic   Number Minutes Vasopneumatic  10 minutes    Vasopnuematic Location  Knee    Vasopneumatic Pressure Medium    Vasopneumatic Temperature  34                 Balance Exercises - 10/22/20 0908       Balance Exercises: Standing   Balance Beam sidestepping and tandem walking fwd/bwd x 5 laps in // bars each; intermittent UE support needed                 PT Short Term Goals - 10/22/20 0933       PT SHORT TERM GOAL #1   Title Pt will be independent with initial HEP    Status Achieved      PT SHORT TERM GOAL #2   Title Lt knee  AROM improved 0-100 for improved function    Status Achieved               PT Long Term Goals - 10/22/20 0933       PT LONG TERM GOAL #1   Title Independent with final HEP    Status On-going    Target Date 11/21/20      PT LONG TERM GOAL #2   Title Lt knee AROM improved 0-110 for improved function    Status Achieved      PT LONG TERM GOAL #3   Title Amb independently without significant deviations for improved function    Status On-going    Target Date 11/21/20      PT LONG TERM GOAL #4   Title Report pain < 2/10 for improved function    Status On-going    Target Date 11/21/20      PT LONG TERM GOAL #5   Title FOTO score improved to 50 for improved function    Status Achieved                   Plan - 10/22/20 0933     Clinical Impression Statement Pt has met all STGs and 2 LTGs to date.  She still has some mild gait deviations without AD as well as slightly elevated pain.  At this time main focus is balance and strengthening to maximize function and transition to amb without AD 100% of the time and finalize HEP for strengthening and balance.  Anticipate early d/c from PT at this time.    Personal Factors and Comorbidities Age;Comorbidity 1;Comorbidity 3+;Comorbidity 2;Past/Current Experience;Profession;Time since onset of injury/illness/exacerbation    Comorbidities Fibromyalgia, DDD, OA    Examination-Activity Limitations Sit;Stand;Transfers;Locomotion Level;Sleep;Squat;Stairs;Lift    Examination-Participation Restrictions Community Activity;Driving;Occupation    Stability/Clinical Decision Making Stable/Uncomplicated    Rehab Potential Good    PT Frequency 2x / week  PT Duration 8 weeks    PT Treatment/Interventions Aquatic Therapy;ADLs/Self Care Home Management;Electrical Stimulation;Moist Heat;Gait training;Stair training;Functional mobility training;Therapeutic activities;Therapeutic exercise;Balance training;Neuromuscular re-education;Patient/family  education;Manual techniques;Passive range of motion;Dry needling;Taping;Vasopneumatic Device;Cryotherapy    PT Next Visit Plan see what MD says, likely begin d/c planning    PT Home Exercise Plan Access Code: [email or web address]    Consulted and Agree with Plan of Care Patient             Patient will benefit from skilled therapeutic intervention in order to improve the following deficits and impairments:  Decreased endurance, Hypomobility, Decreased activity tolerance, Decreased strength, Increased fascial restricitons, Pain, Decreased balance, Decreased mobility, Difficulty walking, Decreased range of motion, Impaired flexibility, Postural dysfunction, Abnormal gait, Increased edema, Decreased knowledge of use of DME  Visit Diagnosis: Acute pain of left knee  Stiffness of left knee, not elsewhere classified  Muscle weakness (generalized)  Localized edema  Other abnormalities of gait and mobility     Problem List Patient Active Problem List   Diagnosis Date Noted   Primary osteoarthritis of left knee 09/08/2020   DJD (degenerative joint disease) of knee 09/08/2020   Status post total left knee replacement 09/08/2020   Spondylosis without myelopathy or radiculopathy, lumbar region 07/17/2020   Chronic pain syndrome 07/17/2020   Myalgia 07/17/2020   OSA (obstructive sleep apnea) 05/19/2020   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/17/2020   Sleep disturbance 04/17/2020   Chronic pain of both shoulders 03/03/2020   Pain in both hands 03/03/2020   Primary osteoarthritis of both knees 03/03/2020   Pain in both feet 03/03/2020   History of chronic kidney disease 03/03/2020   DDD (degenerative disc disease), lumbar 02/05/2020   Vitamin D deficiency 01/09/2020   Hyperlipidemia 01/09/2020   CKD (chronic kidney disease) stage 3, GFR 30-59 ml/min ([place]) 01/09/2020   HTN (hypertension)    Morbid obesity ([place])    Gout    Fibromyalgia    Depression    Insomnia        Laureen Abrahams, PT, DPT 10/22/20 9:43 AM     [facility] [address] [place], [place], [address] Phone: [phone]   Fax:  [phone]  Name: Amy Hooper MRN: [id number] Date of Birth: 1950-04-04

## 2020-10-22 NOTE — Progress Notes (Signed)
Post-Op Visit Note   [name]           Date of Birth: 09-04-1950           MRN: [id number] Visit Date: 10/22/2020 PCP: Isaac Bliss, [name], MD   Assessment & Plan:  Chief Complaint:  Chief Complaint  Patient presents with   Left Knee - Routine Post Op   Visit Diagnoses:  1. Hx of total knee replacement, left     Plan: Patient is a pleasant 70 year old female who comes in today 6 weeks out left total knee replacement 09/08/2020.  She has been doing well.  Some soreness but nothing more.  She is in physical therapy making great progress.  Examination of the left knee reveals a fully healed surgical scar without complication.  Range of motion 0 to 120 degrees.  Stable valgus varus stress.  She is neurovascular intact distally.  At this point, she will continue to advance with activity as tolerated.  Dental prophylaxis reinforced.  Follow-up with [place] in 6 weeks time for recheck.  Follow-Up Instructions: Return in about 6 weeks (around 12/03/2020).   Orders:  Orders Placed This Encounter  Procedures   XR Knee 1-2 Views Left   No orders of the defined types were placed in this encounter.   Imaging: XR Knee 1-2 Views Left  Result Date: 10/22/2020 Well-seated prosthesis without complication   PMFS History: Patient Active Problem List   Diagnosis Date Noted   Primary osteoarthritis of left knee 09/08/2020   DJD (degenerative joint disease) of knee 09/08/2020   Status post total left knee replacement 09/08/2020   Spondylosis without myelopathy or radiculopathy, lumbar region 07/17/2020   Chronic pain syndrome 07/17/2020   Myalgia 07/17/2020   OSA (obstructive sleep apnea) 05/19/2020   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/17/2020   Sleep disturbance 04/17/2020   Chronic pain of both shoulders 03/03/2020   Pain in both hands 03/03/2020   Primary osteoarthritis of both knees 03/03/2020   Pain in both feet 03/03/2020   History of  chronic kidney disease 03/03/2020   DDD (degenerative disc disease), lumbar 02/05/2020   Vitamin D deficiency 01/09/2020   Hyperlipidemia 01/09/2020   CKD (chronic kidney disease) stage 3, GFR 30-59 ml/min ([place]) 01/09/2020   HTN (hypertension)    Morbid obesity ([place])    Gout    Fibromyalgia    Depression    Insomnia    Past Medical History:  Diagnosis Date   Chronic kidney disease    Depression    Fibromyalgia    Gout    HTN (hypertension)    Insomnia    Morbid obesity ([facility])    Osteoarthritis    Palpitation    Sleep apnea     Family History  Problem Relation Age of Onset   Hypothyroidism Mother    Hyperlipidemia Other    Kidney failure Father    Dementia Father    Gout Father    Arthritis Father    Prostate cancer Father     Past Surgical History:  Procedure Laterality Date   KNEE ARTHROSCOPY     right rotator cuff     ROTATOR CUFF REPAIR Left    TOTAL KNEE ARTHROPLASTY Left 09/08/2020   Procedure: LEFT TOTAL KNEE ARTHROPLASTY;  Surgeon: Leandrew Koyanagi, MD;  Location: [facility];  Service: Orthopedics;  Laterality: Left;   TUBAL LIGATION     Social History   Occupational History   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

## 2020-10-23 ENCOUNTER — Encounter: Payer: Self-pay | Admitting: Orthopaedic Surgery

## 2020-10-23 ENCOUNTER — Telehealth: Payer: Self-pay | Admitting: Physical Medicine and Rehabilitation

## 2020-10-23 NOTE — Telephone Encounter (Signed)
Is auth needed for bilateral L4-5 and L5-S1 MBB? Patient has not been scheduled.

## 2020-10-27 ENCOUNTER — Encounter: Payer: Self-pay | Admitting: Physical Therapy

## 2020-10-27 ENCOUNTER — Telehealth: Payer: Self-pay

## 2020-10-27 ENCOUNTER — Ambulatory Visit (INDEPENDENT_AMBULATORY_CARE_PROVIDER_SITE_OTHER): Payer: No Typology Code available for payment source | Admitting: Physical Therapy

## 2020-10-27 ENCOUNTER — Other Ambulatory Visit: Payer: Self-pay

## 2020-10-27 DIAGNOSIS — M25662 Stiffness of left knee, not elsewhere classified: Secondary | ICD-10-CM

## 2020-10-27 DIAGNOSIS — R2689 Other abnormalities of gait and mobility: Secondary | ICD-10-CM

## 2020-10-27 DIAGNOSIS — R600 Localized edema: Secondary | ICD-10-CM

## 2020-10-27 DIAGNOSIS — M25562 Pain in left knee: Secondary | ICD-10-CM

## 2020-10-27 DIAGNOSIS — F411 Generalized anxiety disorder: Secondary | ICD-10-CM

## 2020-10-27 DIAGNOSIS — M6281 Muscle weakness (generalized): Secondary | ICD-10-CM | POA: Diagnosis not present

## 2020-10-27 NOTE — Therapy (Signed)
[facility] Physical Therapy [address] [place], [place], [address] Phone: [phone]   Fax:  [phone]  Physical Therapy Treatment  Patient Details  Name: Amy Hooper MRN: [id number] Date of Birth: 16-Oct-1950 Referring Provider (PT): Aundra Dubin, [place]   Encounter Date: 10/27/2020   PT End of Session - 10/27/20 0921     Visit Number 9    Number of Visits 16    Date for PT Re-Evaluation 11/21/20    Authorization Type PHCS multiplan    Progress Note Due on Visit 18    PT Start Time 0850    PT Stop Time 0930    PT Time Calculation (min) 40 min    Activity Tolerance Patient tolerated treatment well    Behavior During Therapy [facility] for tasks assessed/performed             Past Medical History:  Diagnosis Date   Chronic kidney disease    Depression    Fibromyalgia    Gout    HTN (hypertension)    Insomnia    Morbid obesity ([place])    Osteoarthritis    Palpitation    Sleep apnea     Past Surgical History:  Procedure Laterality Date   KNEE ARTHROSCOPY     right rotator cuff     ROTATOR CUFF REPAIR Left    TOTAL KNEE ARTHROPLASTY Left 09/08/2020   Procedure: LEFT TOTAL KNEE ARTHROPLASTY;  Surgeon: Leandrew Koyanagi, MD;  Location: [facility];  Service: Orthopedics;  Laterality: Left;   TUBAL LIGATION      There were no vitals filed for this visit.   Subjective Assessment - 10/27/20 0852     Subjective Lt knee was more swollen after last MD appt, Dr. Erlinda Hong said it was normal.    Pertinent History Fibromyalgia, OA, DDD, gets regular steroid shots in L knee and R hip    How long can you sit comfortably? Aggravates back; difficulty getting up    How long can you walk comfortably? Does not walk too much due to L knee pain    Patient Stated Goals Increase movement, decrease pain    Currently in Pain? Yes    Pain Score 7     Pain Location Knee    Pain Orientation Left    Pain Descriptors / Indicators Aching;Sore;Tightness    Pain Type  Surgical pain    Pain Onset More than a month ago    Pain Frequency Constant    Aggravating Factors  constant, walking    Pain Relieving Factors rest    Pain Onset 1 to 4 weeks ago                               [facility] Adult PT Treatment/Exercise - 10/27/20 0856       Knee/Hip Exercises: Aerobic   Nustep Lvl 6 10 mins UE/LE      Knee/Hip Exercises: Machines for Strengthening   Total Gym Leg Press bilateral: 100# 3x10, LLE only 56# 3x10      Knee/Hip Exercises: Standing   Lateral Step Up Limitations LLE on 4" step with Rt heel tap down 2x10    Forward Step Up Step Height: 6";2 sets;10 reps;Hand Hold: 1;Both      Vasopneumatic   Number Minutes Vasopneumatic  10 minutes    Vasopnuematic Location  Knee    Vasopneumatic Pressure High    Vasopneumatic Temperature  34  PT Short Term Goals - 10/22/20 0933       PT SHORT TERM GOAL #1   Title Pt will be independent with initial HEP    Status Achieved      PT SHORT TERM GOAL #2   Title Lt knee AROM improved 0-100 for improved function    Status Achieved               PT Long Term Goals - 10/22/20 0933       PT LONG TERM GOAL #1   Title Independent with final HEP    Status On-going    Target Date 11/21/20      PT LONG TERM GOAL #2   Title Lt knee AROM improved 0-110 for improved function    Status Achieved      PT LONG TERM GOAL #3   Title Amb independently without significant deviations for improved function    Status On-going    Target Date 11/21/20      PT LONG TERM GOAL #4   Title Report pain < 2/10 for improved function    Status On-going    Target Date 11/21/20      PT LONG TERM GOAL #5   Title FOTO score improved to 50 for improved function    Status Achieved                   Plan - 10/27/20 0921     Clinical Impression Statement Pt tolerated session well today despite increased swelling and soreness in Lt knee.  Vaso performed with  high pressure today and reviewed edema management interventions with pt.  Will continue to benefit from PT to maximize function.  May still be ready for d/c in upcoming visits but will plan to monitor and adjust PRN.    Personal Factors and Comorbidities Age;Comorbidity 1;Comorbidity 3+;Comorbidity 2;Past/Current Experience;Profession;Time since onset of injury/illness/exacerbation    Comorbidities Fibromyalgia, DDD, OA    Examination-Activity Limitations Sit;Stand;Transfers;Locomotion Level;Sleep;Squat;Stairs;Lift    Examination-Participation Restrictions Community Activity;Driving;Occupation    Stability/Clinical Decision Making Stable/Uncomplicated    Rehab Potential Good    PT Frequency 2x / week    PT Duration 8 weeks    PT Treatment/Interventions Aquatic Therapy;ADLs/Self Care Home Management;Electrical Stimulation;Moist Heat;Gait training;Stair training;Functional mobility training;Therapeutic activities;Therapeutic exercise;Balance training;Neuromuscular re-education;Patient/family education;Manual techniques;Passive range of motion;Dry needling;Taping;Vasopneumatic Device;Cryotherapy    PT Next Visit Plan continue strengthening, balance    PT Home Exercise Plan Access Code: [email or web address]    Consulted and Agree with Plan of Care Patient             Patient will benefit from skilled therapeutic intervention in order to improve the following deficits and impairments:  Decreased endurance, Hypomobility, Decreased activity tolerance, Decreased strength, Increased fascial restricitons, Pain, Decreased balance, Decreased mobility, Difficulty walking, Decreased range of motion, Impaired flexibility, Postural dysfunction, Abnormal gait, Increased edema, Decreased knowledge of use of DME  Visit Diagnosis: Acute pain of left knee  Stiffness of left knee, not elsewhere classified  Muscle weakness (generalized)  Localized edema  Other abnormalities of gait and mobility     Problem  List Patient Active Problem List   Diagnosis Date Noted   Primary osteoarthritis of left knee 09/08/2020   DJD (degenerative joint disease) of knee 09/08/2020   Status post total left knee replacement 09/08/2020   Spondylosis without myelopathy or radiculopathy, lumbar region 07/17/2020   Chronic pain syndrome 07/17/2020   Myalgia 07/17/2020   OSA (obstructive sleep apnea) 05/19/2020   Bilateral post-traumatic  osteoarthritis of knee 04/17/2020   Sleep disturbance 04/17/2020   Chronic pain of both shoulders 03/03/2020   Pain in both hands 03/03/2020   Primary osteoarthritis of both knees 03/03/2020   Pain in both feet 03/03/2020   History of chronic kidney disease 03/03/2020   DDD (degenerative disc disease), lumbar 02/05/2020   Vitamin D deficiency 01/09/2020   Hyperlipidemia 01/09/2020   CKD (chronic kidney disease) stage 3, GFR 30-59 ml/min ([place]) 01/09/2020   HTN (hypertension)    Morbid obesity ([place])    Gout    Fibromyalgia    Depression    Insomnia     Laureen Abrahams, PT, DPT 10/27/20 9:23 AM     [facility] Physical Therapy [address] [place], [place], [address] Phone: [phone]   Fax:  [phone]  Name: Amy Hooper MRN: [id number] Date of Birth: [birth year]

## 2020-10-27 NOTE — Telephone Encounter (Signed)
Pt requesting Rx for her appt 8/18, Please Advise.

## 2020-10-29 ENCOUNTER — Other Ambulatory Visit: Payer: Self-pay

## 2020-10-29 ENCOUNTER — Encounter: Payer: Self-pay | Admitting: Physical Therapy

## 2020-10-29 ENCOUNTER — Ambulatory Visit (INDEPENDENT_AMBULATORY_CARE_PROVIDER_SITE_OTHER): Payer: No Typology Code available for payment source | Admitting: Physical Therapy

## 2020-10-29 DIAGNOSIS — M25562 Pain in left knee: Secondary | ICD-10-CM | POA: Diagnosis not present

## 2020-10-29 DIAGNOSIS — R600 Localized edema: Secondary | ICD-10-CM

## 2020-10-29 DIAGNOSIS — M25662 Stiffness of left knee, not elsewhere classified: Secondary | ICD-10-CM | POA: Diagnosis not present

## 2020-10-29 DIAGNOSIS — M6281 Muscle weakness (generalized): Secondary | ICD-10-CM

## 2020-10-29 DIAGNOSIS — R2689 Other abnormalities of gait and mobility: Secondary | ICD-10-CM

## 2020-10-29 NOTE — Therapy (Signed)
[facility] Physical Therapy [address] [place], [place], [address] Phone: [phone]   Fax:  [phone]  Physical Therapy Treatment  Patient Details  Name: Amy Hooper MRN: [id number] Date of Birth: March 23, 1950 Referring Provider (PT): Aundra Dubin, [place]   Encounter Date: 10/29/2020   PT End of Session - 10/29/20 0923     Visit Number 10    Number of Visits 16    Date for PT Re-Evaluation 11/21/20    Authorization Type PHCS multiplan    Progress Note Due on Visit 18    PT Start Time 0844    PT Stop Time 0923    PT Time Calculation (min) 39 min    Activity Tolerance Patient tolerated treatment well    Behavior During Therapy [facility] for tasks assessed/performed             Past Medical History:  Diagnosis Date   Chronic kidney disease    Depression    Fibromyalgia    Gout    HTN (hypertension)    Insomnia    Morbid obesity ([place])    Osteoarthritis    Palpitation    Sleep apnea     Past Surgical History:  Procedure Laterality Date   KNEE ARTHROSCOPY     right rotator cuff     ROTATOR CUFF REPAIR Left    TOTAL KNEE ARTHROPLASTY Left 09/08/2020   Procedure: LEFT TOTAL KNEE ARTHROPLASTY;  Surgeon: Leandrew Koyanagi, MD;  Location: [facility];  Service: Orthopedics;  Laterality: Left;   TUBAL LIGATION      There were no vitals filed for this visit.   Subjective Assessment - 10/29/20 0847     Subjective Lt knee is stiff and sore, but no pain    Pertinent History Fibromyalgia, OA, DDD, gets regular steroid shots in L knee and R hip    How long can you sit comfortably? Aggravates back; difficulty getting up    How long can you walk comfortably? Does not walk too much due to L knee pain    Patient Stated Goals Increase movement, decrease pain    Currently in Pain? No/denies                               [facility] Adult PT Treatment/Exercise - 10/29/20 0848       Knee/Hip Exercises: Aerobic   Recumbent Bike L2 x 8  min      Knee/Hip Exercises: Machines for Strengthening   Cybex Knee Extension LLE only 3x10 5#    Cybex Knee Flexion LLE only 15# 3x10                 Balance Exercises - 10/29/20 0001       Balance Exercises: Standing   SLS --   LLE on foam with Rt toe tap behind: ball in hand movements   Balance Beam sidestepping and tandem walking fwd/bwd x 5 laps in // bars each; intermittent UE support needed                 PT Short Term Goals - 10/22/20 0933       PT SHORT TERM GOAL #1   Title Pt will be independent with initial HEP    Status Achieved      PT SHORT TERM GOAL #2   Title Lt knee AROM improved 0-100 for improved function    Status Achieved  PT Long Term Goals - 10/22/20 0933       PT LONG TERM GOAL #1   Title Independent with final HEP    Status On-going    Target Date 11/21/20      PT LONG TERM GOAL #2   Title Lt knee AROM improved 0-110 for improved function    Status Achieved      PT LONG TERM GOAL #3   Title Amb independently without significant deviations for improved function    Status On-going    Target Date 11/21/20      PT LONG TERM GOAL #4   Title Report pain < 2/10 for improved function    Status On-going    Target Date 11/21/20      PT LONG TERM GOAL #5   Title FOTO score improved to 50 for improved function    Status Achieved                   Plan - 10/29/20 0923     Clinical Impression Statement Session today focused on continued strengthening and balance work today.  Anticipate d/c next 1-2 visits as knee is not as sore and uncomfortable as last week.    Personal Factors and Comorbidities Age;Comorbidity 1;Comorbidity 3+;Comorbidity 2;Past/Current Experience;Profession;Time since onset of injury/illness/exacerbation    Comorbidities Fibromyalgia, DDD, OA    Examination-Activity Limitations Sit;Stand;Transfers;Locomotion Level;Sleep;Squat;Stairs;Lift    Examination-Participation Restrictions  Community Activity;Driving;Occupation    Stability/Clinical Decision Making Stable/Uncomplicated    Rehab Potential Good    PT Frequency 2x / week    PT Duration 8 weeks    PT Treatment/Interventions Aquatic Therapy;ADLs/Self Care Home Management;Electrical Stimulation;Moist Heat;Gait training;Stair training;Functional mobility training;Therapeutic activities;Therapeutic exercise;Balance training;Neuromuscular re-education;Patient/family education;Manual techniques;Passive range of motion;Dry needling;Taping;Vasopneumatic Device;Cryotherapy    PT Next Visit Plan check goals, plan for d/c    PT Home Exercise Plan Access Code: [email or web address]    Consulted and Agree with Plan of Care Patient             Patient will benefit from skilled therapeutic intervention in order to improve the following deficits and impairments:  Decreased endurance, Hypomobility, Decreased activity tolerance, Decreased strength, Increased fascial restricitons, Pain, Decreased balance, Decreased mobility, Difficulty walking, Decreased range of motion, Impaired flexibility, Postural dysfunction, Abnormal gait, Increased edema, Decreased knowledge of use of DME  Visit Diagnosis: Acute pain of left knee  Stiffness of left knee, not elsewhere classified  Muscle weakness (generalized)  Localized edema  Other abnormalities of gait and mobility     Problem List Patient Active Problem List   Diagnosis Date Noted   Primary osteoarthritis of left knee 09/08/2020   DJD (degenerative joint disease) of knee 09/08/2020   Status post total left knee replacement 09/08/2020   Spondylosis without myelopathy or radiculopathy, lumbar region 07/17/2020   Chronic pain syndrome 07/17/2020   Myalgia 07/17/2020   OSA (obstructive sleep apnea) 05/19/2020   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/17/2020   Sleep disturbance 04/17/2020   Chronic pain of both shoulders 03/03/2020   Pain in both hands 03/03/2020   Primary  osteoarthritis of both knees 03/03/2020   Pain in both feet 03/03/2020   History of chronic kidney disease 03/03/2020   DDD (degenerative disc disease), lumbar 02/05/2020   Vitamin D deficiency 01/09/2020   Hyperlipidemia 01/09/2020   CKD (chronic kidney disease) stage 3, GFR 30-59 ml/min ([facility]) 01/09/2020   HTN (hypertension)    Morbid obesity ([facility])    Gout    Fibromyalgia  Depression    Insomnia       Laureen Abrahams, PT, DPT 10/29/20 9:25 AM     [facility] Physical Therapy [address] [place], [place], [address] Phone: [phone]   Fax:  [phone]  Name: Marijo Barten MRN: [id number] Date of Birth: 02/23/1951

## 2020-11-03 ENCOUNTER — Encounter: Payer: Self-pay | Admitting: Rehabilitative and Restorative Service Providers"

## 2020-11-03 ENCOUNTER — Other Ambulatory Visit: Payer: Self-pay

## 2020-11-03 ENCOUNTER — Ambulatory Visit (INDEPENDENT_AMBULATORY_CARE_PROVIDER_SITE_OTHER): Payer: No Typology Code available for payment source | Admitting: Rehabilitative and Restorative Service Providers"

## 2020-11-03 ENCOUNTER — Encounter: Payer: Self-pay | Admitting: Physical Medicine and Rehabilitation

## 2020-11-03 DIAGNOSIS — M25562 Pain in left knee: Secondary | ICD-10-CM

## 2020-11-03 DIAGNOSIS — M6281 Muscle weakness (generalized): Secondary | ICD-10-CM

## 2020-11-03 DIAGNOSIS — R2689 Other abnormalities of gait and mobility: Secondary | ICD-10-CM

## 2020-11-03 DIAGNOSIS — M25662 Stiffness of left knee, not elsewhere classified: Secondary | ICD-10-CM | POA: Diagnosis not present

## 2020-11-03 DIAGNOSIS — R600 Localized edema: Secondary | ICD-10-CM | POA: Diagnosis not present

## 2020-11-03 MED ORDER — DIAZEPAM 5 MG PO TABS: ORAL_TABLET | ORAL | 0 refills | Status: DC

## 2020-11-03 NOTE — Addendum Note (Signed)
Addended by: Raymondo Band on: 11/03/2020 03:22 PM   Modules accepted: Orders

## 2020-11-03 NOTE — Therapy (Signed)
[facility] Therapy [address] [place], [place], [address] Phone: [phone]   Fax:  [phone]  Physical Therapy Treatment/Discharge  Patient Details  Name: Amy Hooper MRN: [id number] Date of Birth: 08/17/1950 Referring Provider (PT): Aundra Dubin, [place]   Encounter Date: 11/03/2020  PHYSICAL THERAPY DISCHARGE SUMMARY  Visits from Start of Care: 11  Current functional level related to goals / functional outcomes: See note   Remaining deficits: See note   Education / Equipment: HEP   Patient agrees to discharge. Patient goals were met. Patient is being discharged due to meeting the stated rehab goals.    PT End of Session - 11/03/20 0910     Visit Number 11    Number of Visits 16    Date for PT Re-Evaluation 11/21/20    Authorization Type PHCS multiplan    Progress Note Due on Visit 18    PT Start Time 0853    PT Stop Time 0923    PT Time Calculation (min) 30 min    Activity Tolerance Patient tolerated treatment well    Behavior During Therapy WFL for tasks assessed/performed             Past Medical History:  Diagnosis Date   Chronic kidney disease    Depression    Fibromyalgia    Gout    HTN (hypertension)    Insomnia    Morbid obesity ([place])    Osteoarthritis    Palpitation    Sleep apnea     Past Surgical History:  Procedure Laterality Date   KNEE ARTHROSCOPY     right rotator cuff     ROTATOR CUFF REPAIR Left    TOTAL KNEE ARTHROPLASTY Left 09/08/2020   Procedure: LEFT TOTAL KNEE ARTHROPLASTY;  Surgeon: Leandrew Koyanagi, MD;  Location: [place];  Service: Orthopedics;  Laterality: Left;   TUBAL LIGATION      There were no vitals filed for this visit.   Subjective Assessment - 11/03/20 0856     Subjective Pt. indicated knee is sore, maybe a bit more than usual.  Pt indicated having a return of irritation of previous sciatic complaints that were some improved c exercise.    Pertinent History  Fibromyalgia, OA, DDD, gets regular steroid shots in L knee and R hip    How long can you sit comfortably? Aggravates back; difficulty getting up    How long can you walk comfortably? Does not walk too much due to L knee pain    Patient Stated Goals Increase movement, decrease pain    Currently in Pain? No/denies   reported knee surgery               [facility] PT Assessment - 11/03/20 0001       Assessment   Medical Diagnosis Z96.652 (ICD-10-CM) - Hx of total knee replacement, left    Referring Provider (PT) Aundra Dubin, PA-C    Onset Date/Surgical Date 09/08/20    Hand Dominance Right      Observation/Other Assessments   Focus on Therapeutic Outcomes (FOTO)  update 59%      Single Leg Stance   Comments Lt SLS 8 seconds      AROM   Left Knee Extension 0    Left Knee Flexion 118      Strength   Left Knee Flexion 5/5    Left Knee Extension 5/5      Ambulation/Gait   Gait Comments Independent ambulation  [facility] Adult PT Treatment/Exercise - 11/03/20 0001       Knee/Hip Exercises: Stretches   Gastroc Stretch 30 seconds;5 reps;Both   incline board     Knee/Hip Exercises: Aerobic   Recumbent Bike Lvl 2 10 mins      Knee/Hip Exercises: Machines for Strengthening   Cybex Knee Extension LLE only 3x10 5#      Knee/Hip Exercises: Standing   Lateral Step Up Step Height: 6";10 reps;Left    Other Standing Knee Exercises Quick review of using Lt leg for step up and loading in step down due to Rt leg complaints                    PT Education - 11/03/20 0910     Education Details HEP review, d/c planning    Person(s) Educated Patient    Methods Explanation    Comprehension Verbalized understanding              PT Short Term Goals - 10/22/20 0933       PT SHORT TERM GOAL #1   Title Pt will be independent with initial HEP    Status Achieved      PT SHORT TERM GOAL #2   Title Lt knee AROM improved 0-100  for improved function    Status Achieved               PT Long Term Goals - 11/03/20 0909       PT LONG TERM GOAL #1   Title Independent with final HEP    Status Achieved      PT LONG TERM GOAL #2   Title Lt knee AROM improved 0-110 for improved function    Status Achieved      PT LONG TERM GOAL #3   Title Amb independently without significant deviations for improved function    Status Achieved      PT LONG TERM GOAL #4   Title Report pain < 2/10 for improved function    Status Achieved      PT LONG TERM GOAL #5   Title FOTO score improved to 50 for improved function    Status Achieved                   Plan - 11/03/20 0859     Clinical Impression Statement Pt. has attended 11 visits overall during course of treatment.  Pt. indicated global rating of change a very great deal better +7 at this time.  See objective data for updated information.  Due to measured improvements, Pt. was appropriate for d/c to HEP at this time.  Pt. was in agreement with plan at this time and was knowledgeable of HEP.    Personal Factors and Comorbidities Age;Comorbidity 1;Comorbidity 3+;Comorbidity 2;Past/Current Experience;Profession;Time since onset of injury/illness/exacerbation    Comorbidities Fibromyalgia, DDD, OA    Examination-Activity Limitations Sit;Stand;Transfers;Locomotion Level;Sleep;Squat;Stairs;Lift    Examination-Participation Restrictions Community Activity;Driving;Occupation    Stability/Clinical Decision Making Stable/Uncomplicated    Rehab Potential Good    PT Treatment/Interventions Aquatic Therapy;ADLs/Self Care Home Management;Electrical Stimulation;Moist Heat;Gait training;Stair training;Functional mobility training;Therapeutic activities;Therapeutic exercise;Balance training;Neuromuscular re-education;Patient/family education;Manual techniques;Passive range of motion;Dry needling;Taping;Vasopneumatic Device;Cryotherapy    PT Next Visit Plan D/C    PT Home  Exercise Plan Access Code: [email or web address]    Consulted and Agree with Plan of Care Patient             Patient will benefit from skilled therapeutic intervention in order to improve the following deficits  and impairments:  Decreased endurance, Hypomobility, Decreased activity tolerance, Decreased strength, Increased fascial restricitons, Pain, Decreased balance, Decreased mobility, Difficulty walking, Decreased range of motion, Impaired flexibility, Postural dysfunction, Abnormal gait, Increased edema, Decreased knowledge of use of DME  Visit Diagnosis: Acute pain of left knee  Stiffness of left knee, not elsewhere classified  Muscle weakness (generalized)  Localized edema  Other abnormalities of gait and mobility     Problem List Patient Active Problem List   Diagnosis Date Noted   Primary osteoarthritis of left knee 09/08/2020   DJD (degenerative joint disease) of knee 09/08/2020   Status post total left knee replacement 09/08/2020   Spondylosis without myelopathy or radiculopathy, lumbar region 07/17/2020   Chronic pain syndrome 07/17/2020   Myalgia 07/17/2020   OSA (obstructive sleep apnea) 05/19/2020   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/17/2020   Sleep disturbance 04/17/2020   Chronic pain of both shoulders 03/03/2020   Pain in both hands 03/03/2020   Primary osteoarthritis of both knees 03/03/2020   Pain in both feet 03/03/2020   History of chronic kidney disease 03/03/2020   DDD (degenerative disc disease), lumbar 02/05/2020   Vitamin D deficiency 01/09/2020   Hyperlipidemia 01/09/2020   CKD (chronic kidney disease) stage 3, GFR 30-59 ml/min ([place]) 01/09/2020   HTN (hypertension)    Morbid obesity ([place])    Gout    Fibromyalgia    Depression    Insomnia    Scot Jun, PT, DPT, OCS, ATC 11/03/20  9:27 AM    [facility] [address] [place], [place], [address] Phone: [phone]   Fax:   [phone]  Name: Delayla Hoffmaster MRN: [id number] Date of Birth: 08-29-1950

## 2020-11-03 NOTE — Telephone Encounter (Signed)
Pre-procedure diazepam ordered for pre-operative anxiety.  

## 2020-11-05 ENCOUNTER — Encounter: Payer: No Typology Code available for payment source | Admitting: Physical Therapy

## 2020-11-06 ENCOUNTER — Ambulatory Visit (INDEPENDENT_AMBULATORY_CARE_PROVIDER_SITE_OTHER): Payer: No Typology Code available for payment source | Admitting: Physical Medicine and Rehabilitation

## 2020-11-06 ENCOUNTER — Other Ambulatory Visit: Payer: Self-pay

## 2020-11-06 ENCOUNTER — Encounter: Payer: Self-pay | Admitting: Physical Medicine and Rehabilitation

## 2020-11-06 ENCOUNTER — Ambulatory Visit: Payer: Self-pay

## 2020-11-06 VITALS — BP 122/76 | HR 62

## 2020-11-06 DIAGNOSIS — M47816 Spondylosis without myelopathy or radiculopathy, lumbar region: Secondary | ICD-10-CM

## 2020-11-06 MED ORDER — BUPIVACAINE HCL 0.5 % IJ SOLN
3.0000 mL | Freq: Once | INTRAMUSCULAR | Status: AC
  Administered 2020-11-06: 3 mL

## 2020-11-06 NOTE — Patient Instructions (Signed)

## 2020-11-06 NOTE — Progress Notes (Signed)
Pt state lower back pain mostly on her right side. Pt state walking, standing and laying down makes the pain worse. Pt state she takes over the counter pain meds and rest to help ease her pain. Pt has hx of inj 08/21/20 pt state it helped.  Numeric Pain Rating Scale and Functional Assessment Average Pain 7    In the last MONTH (on 0-10 scale) has pain interfered with the following?  1. General activity like being  able to carry out your everyday physical activities such as walking, climbing stairs, carrying groceries, or moving a chair?  Rating(8)   +Driver, -BT, -Dye Allergies.

## 2020-11-07 ENCOUNTER — Encounter
Payer: No Typology Code available for payment source | Attending: Physical Medicine and Rehabilitation | Admitting: Physical Medicine and Rehabilitation

## 2020-11-07 ENCOUNTER — Telehealth: Payer: Self-pay | Admitting: Physical Medicine and Rehabilitation

## 2020-11-07 ENCOUNTER — Other Ambulatory Visit: Payer: Self-pay

## 2020-11-07 ENCOUNTER — Encounter: Payer: Self-pay | Admitting: Physical Medicine and Rehabilitation

## 2020-11-07 VITALS — BP 122/75 | HR 63 | Temp 98.0°F | Ht 62.0 in | Wt 213.8 lb

## 2020-11-07 DIAGNOSIS — G479 Sleep disorder, unspecified: Secondary | ICD-10-CM | POA: Diagnosis not present

## 2020-11-07 DIAGNOSIS — M797 Fibromyalgia: Secondary | ICD-10-CM | POA: Insufficient documentation

## 2020-11-07 DIAGNOSIS — M172 Bilateral post-traumatic osteoarthritis of knee: Secondary | ICD-10-CM | POA: Insufficient documentation

## 2020-11-07 DIAGNOSIS — F411 Generalized anxiety disorder: Secondary | ICD-10-CM

## 2020-11-07 MED ORDER — PREDNISONE 10 MG PO TABS: 10.0000 mg | ORAL_TABLET | Freq: Every day | ORAL | 3 refills | Status: DC

## 2020-11-07 MED ORDER — PREGABALIN 75 MG PO CAPS: 75.0000 mg | ORAL_CAPSULE | Freq: Three times a day (TID) | ORAL | 5 refills | Status: DC

## 2020-11-07 MED ORDER — DIAZEPAM 5 MG PO TABS: ORAL_TABLET | ORAL | 0 refills | Status: DC

## 2020-11-07 NOTE — Telephone Encounter (Signed)
Patient is scheduled for second MBB on 8/29 and would like Valium prior.

## 2020-11-07 NOTE — Telephone Encounter (Signed)
Is auth needed for bilateral L4-5 and L5-S1 MBB #2? Scheduled for 8/29.

## 2020-11-07 NOTE — Progress Notes (Signed)
Subjective:    Patient ID: Amy Hooper, female    DOB: 1951/02/16, 70 y.o.   MRN: [id number]  HPI Female with past medical history/past surgical history of OSA, morbid obesity, hypertension, fibromyalgia, depression, gout, CKD, degenerative disc disease-lumbar, bilateral knee surgeries for torn mensici, bilateral shoulder surgeries for rotator cuff presents with bilateral L > R knee pain.  Initially stated: Started ~1990. After a fall on her knees.  Progressively getting worse.  Steroid injections improve the pain along with brace.  Ambulation exacerbates the pain.  Moving after prolonged postures exacerbates the pain.  Achy.  Radiates down leg.  Intermittent. Denies associated weakness, numbness.  Tylenol. 1 fall summer of 2021 falling backward in chair.  Pain limits ambulation.  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 She has had bilateral L4-5 lumbar epidural steroid injections.  She has also had facet injections.  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  Irritation of S1 nerve roots.  She had a left knee steroid injection on 03/18/2020 with Ortho.  She is seen rheumatology as well.  No reviewed from rheumatology, orthopedic surgeon, physiatrist, neurology-plan for knee replacement in December 2022. She does note that she is improving with weight loss and dietary changes.    She saw pain management since that time and is scheduled for MBB.  Since last visit, she states she has had great improvement with pool therapy. She states she is hungry. She had good benefits with Lyrica. She has not seen Nephro yet. She is awaiting CPAP.  She has bad fibromyalgia flares when the weather changes- especially when it rains. She was started on Neurontin by Dr. Posey Pronto and it does help on a daily basis. She had relief with Celebrex well before but had to stop due to CKD. She  has never tried steroids.   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   Pain Inventory Average Pain 8 Pain Right Now 6 My pain is aching  In the last 24 hours, has pain interfered with the following? General activity 7 Relation with others 7 Enjoyment of life 7 What TIME of day is your pain at its worst? morning  and daytime Sleep (in general) Good  Pain is worse with: walking, bending, sitting, inactivity, standing, and some activites Pain improves with: rest, therapy/exercise, medication, and injections Relief from Meds: 7   Family History  Problem Relation Age of Onset   Hypothyroidism Mother    Hyperlipidemia Other    Kidney failure Father    Dementia Father    Gout Father    Arthritis Father    Prostate cancer Father    Social History   Socioeconomic History   Marital status: Married    Spouse name: Not on file   Number of children: 3   Years of education: Not on file   Highest education level: Not on file  Occupational History   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     Social Determinants of Health  Financial Resource Strain: Not on file  Food Insecurity: Not on file  Transportation Needs: Not on file  Physical Activity: Not on file  Stress: Not on file  Social Connections: Not on file   Past Surgical History:  Procedure Laterality Date   KNEE ARTHROSCOPY     right rotator cuff     ROTATOR CUFF REPAIR Left    TOTAL KNEE ARTHROPLASTY Left 09/08/2020   Procedure: LEFT TOTAL KNEE ARTHROPLASTY;  Surgeon: Leandrew Koyanagi, MD;  Location: [place];  Service: Orthopedics;  Laterality: Left;   TUBAL LIGATION     Past Medical History:  Diagnosis Date   Chronic kidney  disease    Depression    Fibromyalgia    Gout    HTN (hypertension)    Insomnia    Morbid obesity ([facility])    Osteoarthritis    Palpitation    Sleep apnea    BP 122/75 (BP Location: Right Arm)   Pulse 63   Temp 98 F (36.7 C) (Oral)   Ht '5\' 2"'$  (1.575 m)   Wt 213 lb 12.8 oz (97 kg)   SpO2 98%   BMI 39.10 kg/m   Opioid Risk Score:   Fall Risk Score:  `1  Depression screen PHQ 2/9  Depression screen [facility] 2/9 04/22/2020 04/17/2020 01/08/2020  Decreased Interest 0 0 0  Down, Depressed, Hopeless 0 0 0  PHQ - 2 Score 0 0 0  Altered sleeping 1 3 0  Tired, decreased energy 0 0 0  Change in appetite 0 0 0  Feeling bad or failure about yourself  0 0 0  Trouble concentrating 0 0 0  Moving slowly or fidgety/restless 0 0 0  Suicidal thoughts 0 0 0  PHQ-9 Score 1 3 0  Difficult doing work/chores Not difficult at all Not difficult at all Not difficult at all   Review of Systems  Constitutional: Negative.   HENT: Negative.    Eyes: Negative.   Respiratory: Negative.    Cardiovascular: Negative.   Gastrointestinal: Negative.   Endocrine: Negative.   Genitourinary: Negative.   Musculoskeletal:  Positive for arthralgias, back pain, gait problem, joint swelling, myalgias, neck pain and neck stiffness.       Pain in both arms Leg pain Pain in thighs  Allergic/Immunologic: Negative.   Hematological: Negative.   All other systems reviewed and are negative.    Objective:   Physical Exam  Gen: no distress, normal appearing HEENT: oral mucosa pink and moist, NCAT Cardio: Reg rate Chest: normal effort, normal rate of breathing Abd: soft, non-distended Ext: no edema Psych: pleasant, normal affect Skin: intact Musc:  Scattered points of tenderness, improving Neuro: Alert Motor: 5/5 throughout b/l LE Sensation intact to light touch    Assessment & Plan:  Female with past medical history/past surgical history of OSA, morbid obesity, hypertension, fibromyalgia, depression, gout,  CKD, degenerative disc disease-lumbar, bilateral knee surgeries for torn mensici, bilateral shoulder surgeries for rotator cuff presents with bilateral L > R knee pain.   1. Bilateral knee OA - endstage  Patient was supposed to have knee replacements last year, but due to insurance changed to summer 2022  Endstage OA in b/l knee per Ortho, no films available  No benefit with Heat/Cold, PT (~2019), Robaxin, Flexaril, Tizanidine  Continue bracing prn (limit on right knee - educated)  Continue Voltaren gel  Lidocaine patch OTC, does not want to try, wanted to focus on generalized pain  Continue Cymbalta   Patient states main  goal is to ambulate  Wants to buy a pool - afraid of public locations due to [place]  Recommended follow up with Nephro regarding Chondroitin sulfate, appointment next month  Tolerating mild exercise - limited by back   Continue antiinflammatory diet -Discussed following foods that may reduce pain: 1) Ginger (especially studied for arthritis)- reduce leukotriene production to decrease inflammation 2) Blueberries- high in phytonutrients that decrease inflammation 3) Salmon- marine omega-3s reduce joint swelling and pain 4) Pumpkin seeds- reduce inflammation 5) dark chocolate- reduces inflammation 6) turmeric- reduces inflammation 7) tart cherries - reduce pain and stiffness 8) extra virgin olive oil - its compound olecanthal helps to block prostaglandins  9) chili peppers- can be eaten or applied topically via capsaicin 10) mint- helpful for headache, muscle aches, joint pain, and itching 11) garlic- reduces inflammation  Link to further information on diet for chronic pain: http://www.randall.com/   2. Sleep disturbance  Continue Lunesta per PCP  Continue to await CPAP  Insomnia: -Try to go outside near sunrise -Get exercise during the day.  -Discussed good sleep hygiene: turning off all devices an  hour before bedtime.  -Chamomile tea with dinner.  -Can consider over the counter melatonin    3. Morbid Obesity  Continue follow up with dietitian  Encouraged weight loss  4. Myalgia   Will consider trigger point injections  5. Fibromyalgia  Continue  aquatic therapy - great benefit  Encouraged ROM, stretching  Continue Cymbalta  Continue Lyrica 75 TID

## 2020-11-07 NOTE — Patient Instructions (Addendum)
Blue emu oil  -Discussed following foods that may reduce pain: 1) Ginger (especially studied for arthritis)- reduce leukotriene production to decrease inflammation 2) Blueberries- high in phytonutrients that decrease inflammation 3) Salmon- marine omega-3s reduce joint swelling and pain 4) Pumpkin seeds- reduce inflammation 5) dark chocolate- reduces inflammation 6) turmeric- reduces inflammation 7) tart cherries - reduce pain and stiffness 8) extra virgin olive oil - its compound olecanthal helps to block prostaglandins  9) chili peppers- can be eaten or applied topically via capsaicin 10) mint- helpful for headache, muscle aches, joint pain, and itching 11) garlic- reduces inflammation  Link to further information on diet for chronic pain: http://www.randall.com/    Insomnia: -Try to go outside near sunrise -Get exercise during the day.  -Discussed good sleep hygiene: turning off all devices an hour before bedtime.  -Chamomile tea with dinner.  -Can consider over the counter melatonin

## 2020-11-10 ENCOUNTER — Encounter: Payer: No Typology Code available for payment source | Admitting: Physical Therapy

## 2020-11-12 ENCOUNTER — Encounter: Payer: No Typology Code available for payment source | Admitting: Rehabilitative and Restorative Service Providers"

## 2020-11-17 ENCOUNTER — Encounter: Payer: Self-pay | Admitting: Physical Medicine and Rehabilitation

## 2020-11-17 ENCOUNTER — Ambulatory Visit: Payer: Self-pay

## 2020-11-17 ENCOUNTER — Encounter: Payer: No Typology Code available for payment source | Admitting: Physical Therapy

## 2020-11-17 ENCOUNTER — Ambulatory Visit (INDEPENDENT_AMBULATORY_CARE_PROVIDER_SITE_OTHER): Payer: No Typology Code available for payment source | Admitting: Physical Medicine and Rehabilitation

## 2020-11-17 ENCOUNTER — Other Ambulatory Visit: Payer: Self-pay

## 2020-11-17 VITALS — BP 125/79 | HR 63

## 2020-11-17 DIAGNOSIS — M47816 Spondylosis without myelopathy or radiculopathy, lumbar region: Secondary | ICD-10-CM | POA: Diagnosis not present

## 2020-11-17 MED ORDER — BUPIVACAINE HCL 0.5 % IJ SOLN
3.0000 mL | Freq: Once | INTRAMUSCULAR | Status: AC
Start: 2020-11-17 — End: 2020-11-17
  Administered 2020-11-17: 3 mL

## 2020-11-17 NOTE — Procedures (Signed)
Lumbar Diagnostic Facet Joint Nerve Block with Fluoroscopic Guidance   [name]      Date of Birth: 02-27-1951 MRN: [id number] PCP: Isaac Bliss, [name], MD      Visit Date: 11/17/2020   Universal Protocol:    Date/Time: 08/29/224:56 PM  Consent Given By: the patient  Position: PRONE  Additional Comments: Vital signs were monitored before and after the procedure. Patient was prepped and draped in the usual sterile fashion. The correct patient, procedure, and site was verified.   Injection Procedure Details:   Procedure diagnoses:  1. Spondylosis without myelopathy or radiculopathy, lumbar region      Meds Administered:  Meds ordered this encounter  Medications   bupivacaine (MARCAINE) 0.5 % (with pres) injection 3 mL     Laterality: Bilateral  Location/Site: L4-L5, L3 and L4 medial branches and L5-S1, L4 medial branch and L5 dorsal ramus  Needle: 5.0 in., 25 ga.  Short bevel or Quincke spinal needle  Needle Placement: Oblique pedical  Findings:   -Comments: There was excellent flow of contrast along the articular pillars without intravascular flow.  Procedure Details: The fluoroscope beam is vertically oriented in AP and then obliqued 15 to 20 degrees to the ipsilateral side of the desired nerve to achieve the "Scotty dog" appearance.  The skin over the target area of the junction of the superior articulating process and the transverse process (sacral ala if blocking the L5 dorsal rami) was locally anesthetized with a 1 ml volume of 1% Lidocaine without Epinephrine.  The spinal needle was inserted and advanced in a trajectory view down to the target.   After contact with periosteum and negative aspirate for blood and CSF, correct placement without intravascular or epidural spread was confirmed by injecting 0.5 ml. of Isovue-250.  A spot radiograph was obtained of this image.    Next, a 0.5 ml. volume of the injectate described above was  injected. The needle was then redirected to the other facet joint nerves mentioned above if needed.  Prior to the procedure, the patient was given a Pain Diary which was completed for baseline measurements.  After the procedure, the patient rated their pain every 30 minutes and will continue rating at this frequency for a total of 5 hours.  The patient has been asked to complete the Diary and return to [place] by mail, fax or hand delivered as soon as possible.   Additional Comments:  The patient tolerated the procedure well Dressing: 2 x 2 sterile gauze and Band-Aid    Post-procedure details: Patient was observed during the procedure. Post-procedure instructions were reviewed.  Patient left the clinic in stable condition.

## 2020-11-17 NOTE — Progress Notes (Signed)
Amy Hooper - 70 y.o. female MRN [id number]  Date of birth: July 01, 1950  Office Visit Note: Visit Date: 11/17/2020 PCP: Isaac Bliss, [name], MD Referred by: Isaac Bliss, Estel*  Subjective: Chief Complaint  Patient presents with   Lower Back - Pain   HPI:  Amy Hooper is a 70 y.o. female who comes in today for planned repeat Bilateral L4-L5 and L5-S1 Lumbar facet/medial branch block with fluoroscopic guidance.  The patient has failed conservative care including home exercise, medications, time and activity modification.  This injection will be diagnostic and hopefully therapeutic.  Please see requesting physician notes for further details and justification.  Exam shows concordant low back pain with facet joint loading and extension. Patient received more than 80% pain relief from prior injection. This would be the second block in a diagnostic double block paradigm.     Referring:Dr. Eduard Roux   ROS Otherwise per HPI.  Assessment & Plan: Visit Diagnoses:    ICD-10-CM   1. Spondylosis without myelopathy or radiculopathy, lumbar region  M47.816 XR C-ARM NO REPORT    Facet Injection    bupivacaine (MARCAINE) 0.5 % (with pres) injection 3 mL      Plan: No additional findings.   Meds & Orders:  Meds ordered this encounter  Medications   bupivacaine (MARCAINE) 0.5 % (with pres) injection 3 mL    Orders Placed This Encounter  Procedures   Facet Injection   XR C-ARM NO REPORT    Follow-up: No follow-ups on file.   Procedures: No procedures performed  Lumbar Diagnostic Facet Joint Nerve Block with Fluoroscopic Guidance   [name]      Date of Birth: 1950/09/27 MRN: [id number] PCP: Isaac Bliss, [name], MD      Visit Date: 11/17/2020   Universal Protocol:    Date/Time: 08/29/224:56 PM  Consent Given By: the patient  Position: PRONE  Additional Comments: Vital signs were monitored before and after the  procedure. Patient was prepped and draped in the usual sterile fashion. The correct patient, procedure, and site was verified.   Injection Procedure Details:   Procedure diagnoses:  1. Spondylosis without myelopathy or radiculopathy, lumbar region      Meds Administered:  Meds ordered this encounter  Medications   bupivacaine (MARCAINE) 0.5 % (with pres) injection 3 mL     Laterality: Bilateral  Location/Site: L4-L5, L3 and L4 medial branches and L5-S1, L4 medial branch and L5 dorsal ramus  Needle: 5.0 in., 25 ga.  Short bevel or Quincke spinal needle  Needle Placement: Oblique pedical  Findings:   -Comments: There was excellent flow of contrast along the articular pillars without intravascular flow.  Procedure Details: The fluoroscope beam is vertically oriented in AP and then obliqued 15 to 20 degrees to the ipsilateral side of the desired nerve to achieve the "Scotty dog" appearance.  The skin over the target area of the junction of the superior articulating process and the transverse process (sacral ala if blocking the L5 dorsal rami) was locally anesthetized with a 1 ml volume of 1% Lidocaine without Epinephrine.  The spinal needle was inserted and advanced in a trajectory view down to the target.   After contact with periosteum and negative aspirate for blood and CSF, correct placement without intravascular or epidural spread was confirmed by injecting 0.5 ml. of Isovue-250.  A spot radiograph was obtained of this image.    Next, a 0.5 ml. volume of the injectate described above was  injected. The needle was then redirected to the other facet joint nerves mentioned above if needed.  Prior to the procedure, the patient was given a Pain Diary which was completed for baseline measurements.  After the procedure, the patient rated their pain every 30 minutes and will continue rating at this frequency for a total of 5 hours.  The patient has been asked to complete the Diary and  return to [place] by mail, fax or hand delivered as soon as possible.   Additional Comments:  The patient tolerated the procedure well Dressing: 2 x 2 sterile gauze and Band-Aid    Post-procedure details: Patient was observed during the procedure. Post-procedure instructions were reviewed.  Patient left the clinic in stable condition.   Clinical History: MRI LUMBAR SPINE WITHOUT CONTRAST  TECHNIQUE: Multiplanar, multisequence MR imaging of the lumbar spine was performed. No intravenous contrast was administered.  COMPARISON: None.  FINDINGS: Segmentation: 5 lumbar type vertebral bodies. The last full intervertebral disc space is labeled L5-S1.  Alignment: Normal  Vertebrae: No bone lesions or fracture. A few small scattered hemangiomas are noted.  Conus medullaris: Extends to the bottom of L1 level and appears normal.  Paraspinal and other soft tissues: No significant findings.  Disc levels:  L1-2: No significant findings.  L2-3: No significant findings.  L3-4: Mild to moderate facet disease but no disc protrusions, spinal or foraminal stenosis.  L4-5: Moderate facet disease and mild ligamentum flavum thickening. Possible small synovial cyst on the right side. Mild spinal and bilateral lateral recess stenosis but no foraminal stenosis.  L5-S1: Bulging annulus and central disc protrusion with mass effect on the thecal sac move and possibly irritating both S1 nerve roots. The exiting L5 nerve roots are normal. Moderate facet disease.  IMPRESSION: 1. 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 2. Central disc protrusion at L5-S1 with mass effect on the ventral thecal sac and possible irritation of both S1 nerve roots.   Electronically Signed By: Marijo Sanes M.D. On: 09/04/2016 10:30     Objective:  VS:  HT:    WT:   BMI:     BP:125/79  HR:63bpm  TEMP: ( )  RESP:  Physical Exam Vitals and nursing note reviewed.  Constitutional:       General: She is not in acute distress.    Appearance: Normal appearance. She is not ill-appearing.  HENT:     Head: Normocephalic and atraumatic.     Right Ear: External ear normal.     Left Ear: External ear normal.  Eyes:     Extraocular Movements: Extraocular movements intact.  Cardiovascular:     Rate and Rhythm: Normal rate.     Pulses: Normal pulses.  Pulmonary:     Effort: Pulmonary effort is normal. No respiratory distress.  Abdominal:     General: There is no distension.     Palpations: Abdomen is soft.  Musculoskeletal:        General: Tenderness present.     Cervical back: Neck supple.     Right lower leg: No edema.     Left lower leg: No edema.     Comments: Patient has good distal strength with no pain over the greater trochanters.  No clonus or focal weakness. Patient somewhat slow to rise from a seated position to full extension.  There is concordant low back pain with facet loading and lumbar spine extension rotation.  There are no definitive trigger points but the patient is somewhat tender across  the lower back and PSIS.  There is no pain with hip rotation.   Skin:    Findings: No erythema, lesion or rash.  Neurological:     General: No focal deficit present.     Mental Status: She is alert and oriented to person, place, and time.     Sensory: No sensory deficit.     Motor: No weakness or abnormal muscle tone.     Coordination: Coordination normal.  Psychiatric:        Mood and Affect: Mood normal.        Behavior: Behavior normal.     Imaging: XR C-ARM NO REPORT  Result Date: 11/17/2020 Please see Notes tab for imaging impression.

## 2020-11-17 NOTE — Patient Instructions (Signed)

## 2020-11-17 NOTE — Progress Notes (Signed)
Pt state lower back pain and both knees pain. Pt state walking, standing and sitting makes the pain worse. Pt state she take over the counter pain meds to help ease her pain.  Pt has hx of inj on 11/06/20 pt state it helped for four hours and thing started to get worse. Pt state it wasn't pain in the leg it was all back pain.  Numeric Pain Rating Scale and Functional Assessment Average Pain 9   In the last MONTH (on 0-10 scale) has pain interfered with the following?  1. General activity like being  able to carry out your everyday physical activities such as walking, climbing stairs, carrying groceries, or moving a chair?  Rating(10)   +Driver, -BT, -Dye Allergies.

## 2020-11-18 ENCOUNTER — Encounter: Payer: Self-pay | Admitting: Orthopaedic Surgery

## 2020-11-18 ENCOUNTER — Ambulatory Visit (INDEPENDENT_AMBULATORY_CARE_PROVIDER_SITE_OTHER): Payer: No Typology Code available for payment source | Admitting: Orthopaedic Surgery

## 2020-11-18 ENCOUNTER — Telehealth: Payer: Self-pay | Admitting: Physical Medicine and Rehabilitation

## 2020-11-18 ENCOUNTER — Ambulatory Visit (INDEPENDENT_AMBULATORY_CARE_PROVIDER_SITE_OTHER): Payer: No Typology Code available for payment source

## 2020-11-18 VITALS — Ht 62.0 in | Wt 213.0 lb

## 2020-11-18 DIAGNOSIS — G8929 Other chronic pain: Secondary | ICD-10-CM | POA: Diagnosis not present

## 2020-11-18 DIAGNOSIS — Z96652 Presence of left artificial knee joint: Secondary | ICD-10-CM

## 2020-11-18 DIAGNOSIS — M1711 Unilateral primary osteoarthritis, right knee: Secondary | ICD-10-CM

## 2020-11-18 DIAGNOSIS — M5441 Lumbago with sciatica, right side: Secondary | ICD-10-CM

## 2020-11-18 MED ORDER — METHOCARBAMOL 500 MG PO TABS: 500.0000 mg | ORAL_TABLET | Freq: Two times a day (BID) | ORAL | 0 refills | Status: DC | PRN

## 2020-11-18 MED ORDER — METHYLPREDNISOLONE ACETATE 40 MG/ML IJ SUSP
40.0000 mg | INTRAMUSCULAR | Status: AC | PRN
  Administered 2020-11-18: 40 mg via INTRA_ARTICULAR

## 2020-11-18 MED ORDER — PREDNISONE 10 MG (21) PO TBPK: ORAL_TABLET | ORAL | 0 refills | Status: DC

## 2020-11-18 MED ORDER — BUPIVACAINE HCL 0.25 % IJ SOLN
2.0000 mL | INTRAMUSCULAR | Status: AC | PRN
  Administered 2020-11-18: 2 mL via INTRA_ARTICULAR

## 2020-11-18 MED ORDER — LIDOCAINE HCL 1 % IJ SOLN
2.0000 mL | INTRAMUSCULAR | Status: AC | PRN
  Administered 2020-11-18: 2 mL

## 2020-11-18 NOTE — Progress Notes (Signed)
Office Visit Note   [name]           Date of Birth: [birth year]           MRN: [id number] Visit Date: 11/18/2020              Requested by: Isaac Bliss, [name], MD [place],  [place] [address] PCP: Isaac Bliss, [name], MD   Assessment & Plan: Visit Diagnoses:  1. Primary osteoarthritis of right knee   2. History of total knee replacement, left   3. Chronic right-sided low back pain with right-sided sciatica     Plan: Impression is status post left total knee replacement, right knee degenerative joint disease and right lower extremity sciatica.  In regards to the left knee, she is doing well.  Continue to advance with activity as tolerated.  Follow-up with [place] in 4 months time for repeat evaluation and 2 view x-rays.  In regards to the right knee, her symptoms are relatively intermittent at this point and she has not tried a cortisone injection.  I recommended trying this for now which she is agreeable to.  In regards to her right lower extremity lumbar radiculopathy, she was just injected by Dr. Ernestina Patches yesterday.  She needs to give this more time.  If these injections fail to relieve her symptoms, we will refer her to neurosurgery.  Follow-up with [place] as needed. Follow-Up Instructions: Return in about 4 weeks (around 12/16/2020).   Orders:  Orders Placed This Encounter  Procedures   Large Joint Inj: R knee   XR KNEE 3 VIEW RIGHT   XR Lumbar Spine 2-3 Views   Meds ordered this encounter  Medications   predniSONE (STERAPRED UNI-PAK 21 TAB) 10 MG (21) TBPK tablet    Sig: Take as directed    Dispense:  21 tablet    Refill:  0   methocarbamol (ROBAXIN) 500 MG tablet    Sig: Take 1 tablet (500 mg total) by mouth 2 (two) times daily as needed.    Dispense:  20 tablet    Refill:  0       Procedures: Large Joint Inj: R knee on 11/18/2020 9:17 AM Indications: pain Details: 22 G needle, anterolateral approach Medications: 2 mL  lidocaine 1 %; 2 mL bupivacaine 0.25 %; 40 mg methylPREDNISolone acetate 40 MG/ML     Clinical Data: No additional findings.   Subjective: Chief Complaint  Patient presents with   Left Knee - Follow-up    Left total knee arthroplasty 09/08/2020    HPI patient is a pleasant 70 year old female who comes in today a little over 2 months out left total knee replacement 09/08/2020.  She has been doing very well.  She has finished physical therapy.  She is also complaining of right knee pain and right lower extremity sciatica.  In regards to the right knee, she has had pain here for many months.  It is progressively worsened as her left knee has improved.  She notes occasional pain in clicking sensations.  No previous cortisone injection.  She has tried over-the-counter pain medication without relief.  In regards to her right lower extremity sciatica, She has had intermittent shooting pains down the back of her leg.  She denies any paresthesias or bowel or bladder change. she has been on a recent steroid pack without significant relief.  She has not been to physical therapy.  She has recently seen Dr. Ernestina Patches with repeat bilateral L4-5 and  L5-S1 lumbar facet/medial branch blocks.  Review of Systems as detailed in HPI.  All others reviewed and are negative.   Objective: Vital Signs: Ht '5\' 2"'$  (1.575 m)   Wt 213 lb (96.6 kg)   BMI 38.96 kg/m   Physical Exam well-developed well-nourished female in no acute distress.  Alert and oriented x3.  Ortho Exam left knee exam shows range of motion from 0 to 120 degrees.  She is stable valgus varus stress.  She is neurovascular intact distally.  Right knee exam shows no effusion.  Range of motion 5 to 100 degrees.  Medial and lateral joint line tenderness.  Moderate patellofemoral crepitus.  Ligaments are stable.  She is neurovascular intact distally.  Lumbar spine exam shows no spinous or paraspinous tenderness.  She does have pain with lumbar flexion  extension.  Markedly positive straight leg raise.  No focal weakness.  She is neurovascular intact distally.  Specialty Comments:  No specialty comments available.  Imaging: XR KNEE 3 VIEW RIGHT  Result Date: 11/18/2020 X-rays demonstrate moderate degenerative changes to the medial and patellofemoral compartments.  XR Lumbar Spine 2-3 Views  Result Date: 11/18/2020 X-rays demonstrate multilevel degenerative changes    PMFS History: Patient Active Problem List   Diagnosis Date Noted   Primary osteoarthritis of left knee 09/08/2020   DJD (degenerative joint disease) of knee 09/08/2020   Status post total left knee replacement 09/08/2020   Spondylosis without myelopathy or radiculopathy, lumbar region 07/17/2020   Chronic pain syndrome 07/17/2020   Myalgia 07/17/2020   OSA (obstructive sleep apnea) 05/19/2020   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/17/2020   Sleep disturbance 04/17/2020   Chronic pain of both shoulders 03/03/2020   Pain in both hands 03/03/2020   Primary osteoarthritis of both knees 03/03/2020   Pain in both feet 03/03/2020   History of chronic kidney disease 03/03/2020   DDD (degenerative disc disease), lumbar 02/05/2020   Vitamin D deficiency 01/09/2020   Hyperlipidemia 01/09/2020   CKD (chronic kidney disease) stage 3, GFR 30-59 ml/min ([facility]) 01/09/2020   HTN (hypertension)    Morbid obesity ([facility])    Gout    Fibromyalgia    Depression    Insomnia    Past Medical History:  Diagnosis Date   Chronic kidney disease    Depression    Fibromyalgia    Gout    HTN (hypertension)    Insomnia    Morbid obesity ([facility])    Osteoarthritis    Palpitation    Sleep apnea     Family History  Problem Relation Age of Onset   Hypothyroidism Mother    Hyperlipidemia Other    Kidney failure Father    Dementia Father    Gout Father    Arthritis Father    Prostate cancer Father     Past Surgical History:  Procedure Laterality Date   KNEE  ARTHROSCOPY     right rotator cuff     ROTATOR CUFF REPAIR Left    TOTAL KNEE ARTHROPLASTY Left 09/08/2020   Procedure: LEFT TOTAL KNEE ARTHROPLASTY;  Surgeon: Leandrew Koyanagi, MD;  Location: [facility];  Service: Orthopedics;  Laterality: Left;   TUBAL LIGATION     Social History   Occupational History   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

## 2020-11-18 NOTE — Telephone Encounter (Signed)
Needs auth and scheduling for bilateral L4-5 and L5-S1 RFA. Pain diary attached to MyChart message.

## 2020-11-18 NOTE — Telephone Encounter (Signed)
[place], thanks.Marland Kitchen np

## 2020-11-19 ENCOUNTER — Encounter: Payer: No Typology Code available for payment source | Admitting: Physical Therapy

## 2020-11-19 ENCOUNTER — Telehealth: Payer: Self-pay

## 2020-11-19 DIAGNOSIS — F411 Generalized anxiety disorder: Secondary | ICD-10-CM

## 2020-11-19 MED ORDER — DIAZEPAM 5 MG PO TABS: ORAL_TABLET | ORAL | 0 refills | Status: DC

## 2020-11-19 NOTE — Telephone Encounter (Signed)
Pt has request Rx for her RFA appt on 9/20 & 9/27, Please advise

## 2020-11-25 NOTE — Procedures (Signed)
Lumbar Diagnostic Facet Joint Nerve Block with Fluoroscopic Guidance   [name]      Date of Birth: [birth year] MRN: [id number] PCP: Isaac Bliss, [name], MD      Visit Date: 11/06/2020   Universal Protocol:    Date/Time: 09/06/225:25 AM  Consent Given By: the patient  Position: PRONE  Additional Comments: Vital signs were monitored before and after the procedure. Patient was prepped and draped in the usual sterile fashion. The correct patient, procedure, and site was verified.   Injection Procedure Details:   Procedure diagnoses:  1. Spondylosis without myelopathy or radiculopathy, lumbar region      Meds Administered:  Meds ordered this encounter  Medications   bupivacaine (MARCAINE) 0.5 % (with pres) injection 3 mL     Laterality: Bilateral  Location/Site: L4-L5, L3 and L4 medial branches and L5-S1, L4 medial branch and L5 dorsal ramus  Needle: 5.0 in., 25 ga.  Short bevel or Quincke spinal needle  Needle Placement: Oblique pedical  Findings:   -Comments: There was excellent flow of contrast along the articular pillars without intravascular flow.  Procedure Details: The fluoroscope beam is vertically oriented in AP and then obliqued 15 to 20 degrees to the ipsilateral side of the desired nerve to achieve the "Scotty dog" appearance.  The skin over the target area of the junction of the superior articulating process and the transverse process (sacral ala if blocking the L5 dorsal rami) was locally anesthetized with a 1 ml volume of 1% Lidocaine without Epinephrine.  The spinal needle was inserted and advanced in a trajectory view down to the target.   After contact with periosteum and negative aspirate for blood and CSF, correct placement without intravascular or epidural spread was confirmed by injecting 0.5 ml. of Isovue-250.  A spot radiograph was obtained of this image.    Next, a 0.5 ml. volume of the injectate described above was  injected. The needle was then redirected to the other facet joint nerves mentioned above if needed.  Prior to the procedure, the patient was given a Pain Diary which was completed for baseline measurements.  After the procedure, the patient rated their pain every 30 minutes and will continue rating at this frequency for a total of 5 hours.  The patient has been asked to complete the Diary and return to [place] by mail, fax or hand delivered as soon as possible.   Additional Comments:  The patient tolerated the procedure well Dressing: 2 x 2 sterile gauze and Band-Aid    Post-procedure details: Patient was observed during the procedure. Post-procedure instructions were reviewed.  Patient left the clinic in stable condition.

## 2020-11-25 NOTE — Progress Notes (Signed)
Amy Hooper - 70 y.o. female MRN [id number]  Date of birth: 14-Aug-1950  Office Visit Note: Visit Date: 11/06/2020 PCP: Isaac Bliss, [name], MD Referred by: Isaac Bliss, Estel*  Subjective: Chief Complaint  Patient presents with   Lower Back - Pain   HPI:  Amy Hooper is a 70 y.o. female who comes in today for planned Bilateral  L4-L5 and L5-S1 Lumbar facet/medial branch block with fluoroscopic guidance.  The patient has failed conservative care including home exercise, medications, time and activity modification.  This injection will be diagnostic and hopefully therapeutic.  Please see requesting physician notes for further details and justification.  Exam has shown concordant pain with facet joint loading.   ROS Otherwise per HPI.  Assessment & Plan: Visit Diagnoses:    ICD-10-CM   1. Spondylosis without myelopathy or radiculopathy, lumbar region  M47.816 XR C-ARM NO REPORT    Facet Injection    bupivacaine (MARCAINE) 0.5 % (with pres) injection 3 mL      Plan: No additional findings.   Meds & Orders:  Meds ordered this encounter  Medications   bupivacaine (MARCAINE) 0.5 % (with pres) injection 3 mL    Orders Placed This Encounter  Procedures   Facet Injection   XR C-ARM NO REPORT    Follow-up: Return for Review Pain Diary.   Procedures: No procedures performed  Lumbar Diagnostic Facet Joint Nerve Block with Fluoroscopic Guidance   [name]      Date of Birth: 04-17-1950 MRN: [id number] PCP: Isaac Bliss, [name], MD      Visit Date: 11/06/2020   Universal Protocol:    Date/Time: 09/06/225:25 AM  Consent Given By: the patient  Position: PRONE  Additional Comments: Vital signs were monitored before and after the procedure. Patient was prepped and draped in the usual sterile fashion. The correct patient, procedure, and site was verified.   Injection Procedure Details:   Procedure diagnoses:  1.  Spondylosis without myelopathy or radiculopathy, lumbar region      Meds Administered:  Meds ordered this encounter  Medications   bupivacaine (MARCAINE) 0.5 % (with pres) injection 3 mL     Laterality: Bilateral  Location/Site: L4-L5, L3 and L4 medial branches and L5-S1, L4 medial branch and L5 dorsal ramus  Needle: 5.0 in., 25 ga.  Short bevel or Quincke spinal needle  Needle Placement: Oblique pedical  Findings:   -Comments: There was excellent flow of contrast along the articular pillars without intravascular flow.  Procedure Details: The fluoroscope beam is vertically oriented in AP and then obliqued 15 to 20 degrees to the ipsilateral side of the desired nerve to achieve the "Scotty dog" appearance.  The skin over the target area of the junction of the superior articulating process and the transverse process (sacral ala if blocking the L5 dorsal rami) was locally anesthetized with a 1 ml volume of 1% Lidocaine without Epinephrine.  The spinal needle was inserted and advanced in a trajectory view down to the target.   After contact with periosteum and negative aspirate for blood and CSF, correct placement without intravascular or epidural spread was confirmed by injecting 0.5 ml. of Isovue-250.  A spot radiograph was obtained of this image.    Next, a 0.5 ml. volume of the injectate described above was injected. The needle was then redirected to the other facet joint nerves mentioned above if needed.  Prior to the procedure, the patient was given a Pain Diary which was completed  for baseline measurements.  After the procedure, the patient rated their pain every 30 minutes and will continue rating at this frequency for a total of 5 hours.  The patient has been asked to complete the Diary and return to [place] by mail, fax or hand delivered as soon as possible.   Additional Comments:  The patient tolerated the procedure well Dressing: 2 x 2 sterile gauze and Band-Aid     Post-procedure details: Patient was observed during the procedure. Post-procedure instructions were reviewed.  Patient left the clinic in stable condition.    Clinical History: MRI LUMBAR SPINE WITHOUT CONTRAST  TECHNIQUE: Multiplanar, multisequence MR imaging of the lumbar spine was performed. No intravenous contrast was administered.  COMPARISON: None.  FINDINGS: Segmentation: 5 lumbar type vertebral bodies. The last full intervertebral disc space is labeled L5-S1.  Alignment: Normal  Vertebrae: No bone lesions or fracture. A few small scattered hemangiomas are noted.  Conus medullaris: Extends to the bottom of L1 level and appears normal.  Paraspinal and other soft tissues: No significant findings.  Disc levels:  L1-2: No significant findings.  L2-3: No significant findings.  L3-4: Mild to moderate facet disease but no disc protrusions, spinal or foraminal stenosis.  L4-5: Moderate facet disease and mild ligamentum flavum thickening. Possible small synovial cyst on the right side. Mild spinal and bilateral lateral recess stenosis but no foraminal stenosis.  L5-S1: Bulging annulus and central disc protrusion with mass effect on the thecal sac move and possibly irritating both S1 nerve roots. The exiting L5 nerve roots are normal. Moderate facet disease.  IMPRESSION: 1. 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 2. Central disc protrusion at L5-S1 with mass effect on the ventral thecal sac and possible irritation of both S1 nerve roots.   Electronically Signed By: Marijo Sanes M.D. On: 09/04/2016 10:30     Objective:  VS:  HT:    WT:   BMI:     BP:122/76  HR:62bpm  TEMP: ( )  RESP:  Physical Exam Vitals and nursing note reviewed.  Constitutional:      General: She is not in acute distress.    Appearance: Normal appearance. She is obese. She is not ill-appearing.  HENT:     Head: Normocephalic and atraumatic.     Right Ear:  External ear normal.     Left Ear: External ear normal.  Eyes:     Extraocular Movements: Extraocular movements intact.  Cardiovascular:     Rate and Rhythm: Normal rate.     Pulses: Normal pulses.  Pulmonary:     Effort: Pulmonary effort is normal. No respiratory distress.  Abdominal:     General: There is no distension.     Palpations: Abdomen is soft.  Musculoskeletal:        General: Tenderness present.     Cervical back: Neck supple.     Right lower leg: No edema.     Left lower leg: No edema.     Comments: Patient has good distal strength with no pain over the greater trochanters.  No clonus or focal weakness. Patient somewhat slow to rise from a seated position to full extension.  There is concordant low back pain with facet loading and lumbar spine extension rotation.  There are no definitive trigger points but the patient is somewhat tender across the lower back and PSIS.  There is no pain with hip rotation.   Skin:    Findings: No erythema, lesion or rash.  Neurological:  General: No focal deficit present.     Mental Status: She is alert and oriented to person, place, and time.     Sensory: No sensory deficit.     Motor: No weakness or abnormal muscle tone.     Coordination: Coordination normal.  Psychiatric:        Mood and Affect: Mood normal.        Behavior: Behavior normal.     Imaging: No results found.

## 2020-11-26 ENCOUNTER — Encounter: Payer: Self-pay | Admitting: Physical Medicine and Rehabilitation

## 2020-11-26 DIAGNOSIS — M48062 Spinal stenosis, lumbar region with neurogenic claudication: Secondary | ICD-10-CM

## 2020-11-26 DIAGNOSIS — M5416 Radiculopathy, lumbar region: Secondary | ICD-10-CM

## 2020-11-27 ENCOUNTER — Encounter: Payer: Self-pay | Admitting: Orthopaedic Surgery

## 2020-11-28 ENCOUNTER — Other Ambulatory Visit: Payer: Self-pay | Admitting: Internal Medicine

## 2020-11-28 ENCOUNTER — Other Ambulatory Visit: Payer: Self-pay

## 2020-11-28 ENCOUNTER — Telehealth: Payer: Self-pay | Admitting: Orthopaedic Surgery

## 2020-11-28 ENCOUNTER — Telehealth: Payer: Self-pay | Admitting: Neurology

## 2020-11-28 DIAGNOSIS — E559 Vitamin D deficiency, unspecified: Secondary | ICD-10-CM

## 2020-11-28 MED ORDER — TIZANIDINE HCL 4 MG PO TABS: 4.0000 mg | ORAL_TABLET | Freq: Four times a day (QID) | ORAL | 0 refills | Status: DC | PRN

## 2020-11-28 MED ORDER — PREDNISONE 10 MG (21) PO TBPK: ORAL_TABLET | ORAL | 0 refills | Status: DC

## 2020-11-28 NOTE — Telephone Encounter (Signed)
Called pt lm to call back to get appt resched.

## 2020-11-28 NOTE — Telephone Encounter (Signed)
Already done

## 2020-11-28 NOTE — Telephone Encounter (Signed)
Please refill tizanidine and prednisone Dosepak

## 2020-11-28 NOTE — Telephone Encounter (Signed)
Please refill tizanidine.  Thank you

## 2020-12-01 ENCOUNTER — Ambulatory Visit: Payer: No Typology Code available for payment source | Admitting: Neurology

## 2020-12-01 NOTE — Telephone Encounter (Signed)
Initial cpap f/u.

## 2020-12-02 ENCOUNTER — Other Ambulatory Visit: Payer: Self-pay

## 2020-12-02 ENCOUNTER — Ambulatory Visit: Payer: BC Managed Care – PPO | Admitting: Neurology

## 2020-12-02 ENCOUNTER — Ambulatory Visit: Payer: BC Managed Care – PPO | Admitting: Family Medicine

## 2020-12-02 ENCOUNTER — Encounter: Payer: Self-pay | Admitting: Family Medicine

## 2020-12-02 VITALS — BP 151/74 | HR 75 | Ht 62.0 in | Wt 214.5 lb

## 2020-12-02 DIAGNOSIS — Z9989 Dependence on other enabling machines and devices: Secondary | ICD-10-CM

## 2020-12-02 DIAGNOSIS — G4733 Obstructive sleep apnea (adult) (pediatric): Secondary | ICD-10-CM | POA: Diagnosis not present

## 2020-12-02 NOTE — Patient Instructions (Addendum)
Please continue using your CPAP regularly. While your insurance requires that you use CPAP at least 4 hours each night on 70% of the nights, I recommend, that you not skip any nights and use it throughout the night if you can. Getting used to CPAP and staying with the treatment long term does take time and patience and discipline. Untreated obstructive sleep apnea when it is moderate to severe can have an adverse impact on cardiovascular health and raise her risk for heart disease, arrhythmias, hypertension, congestive heart failure, stroke and diabetes. Untreated obstructive sleep apnea causes sleep disruption, nonrestorative sleep, and sleep deprivation. This can have an impact on your day to day functioning and cause daytime sleepiness and impairment of cognitive function, memory loss, mood disturbance, and problems focussing. Using CPAP regularly can improve these symptoms.   Follow up in 4-6 months    

## 2020-12-02 NOTE — Progress Notes (Addendum)
[name] DOB: June 03, 1950  REASON FOR VISIT: follow up HISTORY FROM: patient  Chief Complaint  Patient presents with   Obstructive Sleep Apnea    Rm 2, alone. Here for initial CPAP f/u, pt had issues adjusting in the beginning and now she is doing better. Has a more restful day, not taking naps. Husband states she looks more peacefully and is no longer snoring.      HISTORY OF PRESENT ILLNESS:  12/02/20 ALL:  Amy Hooper is a 70 y.o. female here today for follow up for OSA on CPAP. HST on 04/28/2020 showed moderate OSA with total AHI 18.2/hr and O2 nadir of 84%. She is doing well on CPAP therapy. She did have an adjustment period but feels that she is now very comfortable using CPAP. She feels she is resting better. Daytime energy has improved. She no longer snores. She admits that she does fall asleep before starting therapy contributing to lower compliance. She is feeling well, today, and without concerns.   Compliance report dated 11/02/2020-12/01/2020 shows that she used CPAP 22/30 days for compliance of 73%. She used CPAP greater than 4 hours 20/30 days for compliance of 67%. Average usage on days used was 5.7 hours. Residual AHI 1.5/hr on 5-11cmH20. No significant leak noted.   HISTORY: (copied from Dr Guadelupe Sabin previous note)  Dear Dr. Isaac Hooper,   I saw your patient, Amy Hooper, upon your kind request in the sleep clinic today for initial consultation of her sleep disorder, in particular, concern for underlying obstructive sleep apnea.  The patient is unaccompanied today.  As you know, [name] is a 70 year old right-handed woman with an underlying medical history of osteoarthritis with status post bilateral knee arthroscopic surgeries, bilateral shoulder arthroscopic surgeries, hypertension, gout, depression, fibromyalgia, degenerative disc disease of the lumbar spine, chronic kidney disease, palpitations and obesity, who reports snoring and  excessive daytime somnolence.  I reviewed your office note from January 08, 2020.  Her Epworth sleepiness score is 14 out of 24, fatigue severity score is 60 out of 63.  She is married and lives with her husband, her sister and her mom.  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  She is a non-smoker and does not currently drink any alcohol, drinks caffeine in the form of coffee, 1 cup/day on average. She has had difficulty going to sleep, this has been going on for quite some time, for the past year she has been on Lunesta 2 mg at bedtime and reports that she is not able to sleep without it.  She had tried Benadryl before then.  She reports a family history of sleep apnea, her father had a CPAP machine.  He died at 70.  She has 3 grown children.  She does not watch TV in her bedroom.  She tries to be in bed around 10 PM and rise time is generally around 6 AM.  She is planning to retire.  She will need knee replacement surgery most likely to the left knee, she is planning to have this done in or around August of this year. She has nocturia about 3 times per average 9.  She has radiating low back pain which bothers her often at night as well.   REVIEW OF SYSTEMS: Out of a complete 14 system review of symptoms, the patient complains only of the following symptoms, back pain and all other reviewed systems are negative.  ESS: 6/24, previously 14/24  ALLERGIES: Allergies  Allergen Reactions   Cefuroxime Axetil Rash and Hives   Clarithromycin Rash and Hives   Nsaids Nausea And Vomiting   Percocet [Oxycodone-Acetaminophen] Nausea And Vomiting    Pt. Also states it makes her hallucinate   Morphine    Oxycodone-Acetaminophen Other (See Comments)    Hallucination   Toradol [Ketorolac Tromethamine]    Tramadol Nausea And Vomiting and Other (See Comments)    Upset stomach    HOME MEDICATIONS: Outpatient Medications Prior to Visit  Medication Sig Dispense Refill   allopurinol (ZYLOPRIM) 100  MG tablet Take 200 mg by mouth daily.     amLODipine (NORVASC) 5 MG tablet Take 1 tablet (5 mg total) by mouth daily. 90 tablet 1   atorvastatin (LIPITOR) 10 MG tablet Take 1 tablet (10 mg total) by mouth at bedtime. 90 tablet 1   diazepam (VALIUM) 5 MG tablet Take 1 by mouth 1 hour  pre-procedure with very light food. May bring 2nd tablet to appointment. 2 tablet 0   diclofenac Sodium (VOLTAREN) 1 % GEL Apply 1 application topically 4 (four) times daily as needed (pain).     DULoxetine (CYMBALTA) 30 MG capsule TAKE 1 CAPSULE BY MOUTH EVERY DAY (Patient taking differently: Take 30 mg by mouth at bedtime. TAKE 1 CAPSULE BY MOUTH EVERY DAY) 90 capsule 1   eszopiclone (LUNESTA) 2 MG TABS tablet TAKE 1 TABLET BY MOUTH AT NIGHT IMMEDIATELY BEFORE BEDTIME 30 tablet 2   fluticasone (FLONASE) 50 MCG/ACT nasal spray Place 1 spray into both nostrils daily as needed for allergies.     HYDROcodone-acetaminophen (NORCO) 7.5-325 MG tablet Take 1-2 tablets by mouth 3 (three) times daily as needed for moderate pain. To be taken after surgery 40 tablet 0   loratadine (CLARITIN) 10 MG tablet Take 10 mg by mouth daily as needed for allergies.     losartan (COZAAR) 50 MG tablet Take 1 tablet (50 mg total) by mouth daily. 90 tablet 1   methocarbamol (ROBAXIN) 500 MG tablet Take 1 tablet (500 mg total) by mouth 2 (two) times daily as needed. 20 tablet 0   ondansetron (ZOFRAN) 4 MG tablet Take 1 tablet (4 mg total) by mouth every 6 (six) hours as needed for nausea or vomiting. 60 tablet 0   predniSONE (STERAPRED UNI-PAK 21 TAB) 10 MG (21) TBPK tablet Take as directed 21 tablet 0   pregabalin (LYRICA) 75 MG capsule Take 1 capsule (75 mg total) by mouth 3 (three) times daily. 90 capsule 5   tiZANidine (ZANAFLEX) 4 MG tablet Take 1 tablet (4 mg total) by mouth every 6 (six) hours as needed for muscle spasms. 30 tablet 0   rivaroxaban (XARELTO) 10 MG TABS tablet Take 1 tablet (10 mg total) by mouth daily for 28 days. 28 tablet  0   No facility-administered medications prior to visit.    PAST MEDICAL HISTORY: Past Medical History:  Diagnosis Date   Chronic kidney disease    Depression    Fibromyalgia    Gout    HTN (hypertension)    Insomnia    Morbid obesity (HCC)    Osteoarthritis    Palpitation    Sleep apnea     PAST SURGICAL HISTORY: Past Surgical History:  Procedure Laterality Date   KNEE ARTHROSCOPY     right rotator cuff     ROTATOR CUFF REPAIR Left    TOTAL KNEE ARTHROPLASTY Left 09/08/2020   Procedure: LEFT TOTAL KNEE ARTHROPLASTY;  Surgeon: Leandrew Koyanagi, MD;  Location: [facility];  Service: Orthopedics;  Laterality: Left;   TUBAL LIGATION      FAMILY HISTORY: Family History  Problem Relation Age of Onset   Hypothyroidism Mother    Hyperlipidemia Other    Kidney failure Father    Dementia Father    Gout Father    Arthritis Father    Prostate cancer Father     SOCIAL HISTORY: Social History   Socioeconomic History   Marital status: Married    Spouse name: Not on file   Number of children: 3   Years of education: Not on file   Highest education level: Not on file  Occupational History   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     Social Determinants of Health   Financial Resource Strain: Not on file  Food Insecurity: Not on file  Transportation Needs: Not on file  Physical Activity: Not on file  Stress: Not on file  Social Connections: Not on file  Intimate Partner Violence: Not on file     PHYSICAL EXAM  Vitals:   12/02/20 1521  BP: (!) 151/74  Pulse: 75  Weight: 214 lb 8 oz (97.3 kg)  Height: '5\' 2"'$  (1.575 m)   Body mass index is 39.23  kg/m.  Generalized: Well developed, in no acute distress  Cardiology: normal rate and rhythm, no murmur noted Respiratory: clear to auscultation bilaterally  Neurological examination  Mentation: Alert oriented to time, place, history taking. Follows all commands speech and language fluent Cranial nerve II-XII: Pupils were equal round reactive to light. Extraocular movements were full, visual field were full  Motor: The motor testing reveals 5 over 5 strength of all 4 extremities. Good symmetric motor tone is noted throughout.  Gait and station: Gait is normal.    DIAGNOSTIC DATA (LABS, IMAGING, TESTING) - I reviewed patient records, labs, notes, testing and imaging myself where available.  No flowsheet data found.   Lab Results  Component Value Date   WBC 7.6 09/02/2020   HGB 12.2 09/02/2020   HCT 37.4 09/02/2020   MCV 89.9 09/02/2020   PLT 366 09/02/2020      Component Value Date/Time   NA 140 09/02/2020 0853   K 3.8 09/02/2020 0853   CL 107 09/02/2020 0853   CO2 24 09/02/2020 0853   GLUCOSE 85 09/02/2020 0853   BUN 29 (H) 09/02/2020 0853   CREATININE 1.56 (H) 09/02/2020 0853   CREATININE 1.17 (H) 02/05/2020 0902   CALCIUM 9.5 09/02/2020 0853   PROT 7.4 09/02/2020 0853   ALBUMIN 3.7 09/02/2020 0853   AST 17 09/02/2020 0853   ALT 15 09/02/2020 0853   ALKPHOS 92 09/02/2020 0853   BILITOT 0.3 09/02/2020 0853   GFRNONAA 36 (L) 09/02/2020 0853   GFRNONAA 49 (L) 02/05/2020 0902   GFRAA 56 (L) 02/05/2020 0902   Lab Results  Component Value Date   CHOL 169 09/03/2020   HDL 70.70 09/03/2020   LDLCALC 90 09/03/2020   TRIG 42.0 09/03/2020   CHOLHDL 2 09/03/2020   Lab Results  Component Value Date   HGBA1C 5.7 09/03/2020   Lab Results  Component  Value Date   VITAMINB12 259 09/03/2020   Lab Results  Component Value Date   TSH 1.44 09/03/2020     ASSESSMENT AND PLAN 70 y.o. year old female  has a past medical history of Chronic kidney disease, Depression,  Fibromyalgia, Gout, HTN (hypertension), Insomnia, Morbid obesity ([place]), Osteoarthritis, Palpitation, and Sleep apnea. here with     ICD-10-CM   1. OSA on CPAP  G47.33    Z99.89        Tobey Cozetta Cassie is doing well on CPAP therapy. Compliance report reveals acceptable daily usage but sub optimal four hour usage. She was encouraged to continue using CPAP nightly and for greater than 4 hours each night. I have suggested she consider a bedtime alarm to help her remember to start CPAP therapy. Risks of untreated sleep apnea review and education materials provided. Healthy lifestyle habits encouraged. She will follow up in 4-6 months, sooner if needed. She verbalizes understanding and agreement with this plan.    No orders of the defined types were placed in this encounter.    No orders of the defined types were placed in this encounter.     Debbora Presto, FNP-C 12/02/2020, 3:57 PM [facility] [address], [place], [place] [address] [phone]  I reviewed the above note and documentation by the Nurse Practitioner and agree with the history, exam, assessment and plan as outlined above. I was available for consultation. Star Age, MD, PhD [facility])

## 2020-12-03 ENCOUNTER — Ambulatory Visit: Payer: No Typology Code available for payment source | Admitting: Orthopaedic Surgery

## 2020-12-09 ENCOUNTER — Ambulatory Visit: Payer: BC Managed Care – PPO | Admitting: Physical Medicine and Rehabilitation

## 2020-12-09 ENCOUNTER — Other Ambulatory Visit: Payer: Self-pay

## 2020-12-09 ENCOUNTER — Encounter: Payer: Self-pay | Admitting: Physical Medicine and Rehabilitation

## 2020-12-09 ENCOUNTER — Ambulatory Visit: Payer: Self-pay

## 2020-12-09 VITALS — BP 144/70 | HR 77

## 2020-12-09 DIAGNOSIS — M47816 Spondylosis without myelopathy or radiculopathy, lumbar region: Secondary | ICD-10-CM

## 2020-12-09 MED ORDER — BETAMETHASONE SOD PHOS & ACET 6 (3-3) MG/ML IJ SUSP
12.0000 mg | Freq: Once | INTRAMUSCULAR | Status: AC
  Administered 2020-12-09: 12 mg

## 2020-12-09 NOTE — Progress Notes (Signed)
Amy Hooper - 70 y.o. female MRN [id number]  Date of birth: Dec 13, 1950  Office Visit Note: Visit Date: 12/09/2020 PCP: Isaac Bliss, [name], MD Referred by: Isaac Bliss, Estel*  Subjective: Chief Complaint  Patient presents with   Lower Back - Pain   HPI:  Amy Hooper is a 70 y.o. female who comes in today for planned radiofrequency ablation of the Right L5-S1 and L4-L5 Lumbar facet joints. This would be ablation of the corresponding medial branches and/or dorsal rami.  Patient has had double diagnostic blocks with more than 50% relief.  These are documented on pain diary.  They have had chronic back pain for quite some time, more than 3 months, which has been an ongoing situation with recalcitrant axial back pain.  They have no radicular pain.  Their axial pain is worse with standing and ambulating and on exam today with facet loading.  They have had physical therapy as well as home exercise program.  The imaging noted in the chart below indicated facet pathology. Accordingly they meet all the criteria and qualification for for radiofrequency ablation and we are going to complete this today hopefully for more longer term relief as part of comprehensive management program.  Referring provider: Dr. Eduard Roux   ROS Otherwise per HPI.  Assessment & Plan: Visit Diagnoses:    ICD-10-CM   1. Spondylosis without myelopathy or radiculopathy, lumbar region  M47.816 XR C-ARM NO REPORT    Radiofrequency,Lumbar    betamethasone acetate-betamethasone sodium phosphate (CELESTONE) injection 12 mg      Plan: No additional findings.   Meds & Orders:  Meds ordered this encounter  Medications   betamethasone acetate-betamethasone sodium phosphate (CELESTONE) injection 12 mg    Orders Placed This Encounter  Procedures   Radiofrequency,Lumbar   XR C-ARM NO REPORT    Follow-up: Return if symptoms worsen or fail to improve.   Procedures: No procedures performed   Lumbar Facet Joint Nerve Denervation  [name]      Date of Birth: November 09, 1950 MRN: [id number] PCP: Isaac Bliss, [name], MD      Visit Date: 12/09/2020   Universal Protocol:    Date/Time: 09/20/223:32 PM  Consent Given By: the patient  Position: PRONE  Additional Comments: Vital signs were monitored before and after the procedure. Patient was prepped and draped in the usual sterile fashion. The correct patient, procedure, and site was verified.   Injection Procedure Details:   Procedure diagnoses:  1. Spondylosis without myelopathy or radiculopathy, lumbar region      Meds Administered:  Meds ordered this encounter  Medications   betamethasone acetate-betamethasone sodium phosphate (CELESTONE) injection 12 mg     Laterality: Right  Location/Site:  L4-L5, L3 and L4 medial branches and L5-S1, L4 medial branch and L5 dorsal ramus  Needle: 18 ga.,  26mm active tip RF Cannula  Needle Placement: Along juncture of superior articular process and transverse pocess  Findings:  -Comments:  Procedure Details: For each desired target nerve, the corresponding transverse process (sacral ala for the L5 dorsal rami) was identified and the fluoroscope was positioned to square off the endplates of the corresponding vertebral body to achieve a true AP midline view.  The beam was then obliqued 15 to 20 degrees and caudally tilted 15 to 20 degrees to line up a trajectory along the target nerves. The skin over the target of the junction of superior articulating process and transverse process (sacral ala for the L5 dorsal rami)  was infiltrated with 58ml of 1% Lidocaine without Epinephrine.  The 18 gauge 37mm active tip outer cannula was advanced in trajectory view to the target.  This procedure was repeated for each target nerve.  Then, for all levels, the outer cannula placement was fine-tuned and the position was then confirmed with bi-planar imaging.    Test  stimulation was done both at sensory and motor levels to ensure there was no radicular stimulation. The target tissues were then infiltrated with 1 ml of 1% Lidocaine without Epinephrine. Subsequently, a percutaneous neurotomy was carried out for 90 seconds at 80 degrees Celsius.  After the completion of the lesion, 1 ml of injectate was delivered. It was then repeated for each facet joint nerve mentioned above. Appropriate radiographs were obtained to verify the probe placement during the neurotomy.   Additional Comments:  The patient tolerated the procedure well Dressing: 2 x 2 sterile gauze and Band-Aid    Post-procedure details: Patient was observed during the procedure. Post-procedure instructions were reviewed.  Patient left the clinic in stable condition.      Clinical History: MRI LUMBAR SPINE WITHOUT CONTRAST   TECHNIQUE: Multiplanar, multisequence MR imaging of the lumbar spine was performed. No intravenous contrast was administered.   COMPARISON:  MRI lumbar spine dated September 04, 2016.   FINDINGS: Segmentation:  Standard.   Alignment:  New trace anterolisthesis at L4-L5.   Vertebrae:  No fracture, evidence of discitis, or bone lesion.   Conus medullaris and cauda equina: Conus extends to the L1 level. Conus and cauda equina appear normal.   Paraspinal and other soft tissues: Small left renal cysts. Otherwise negative.   Disc levels:   T12-L1 to L2-L3:  Negative.   L3-L4: Minimal disc bulging. Moderate right and mild left facet arthropathy. Degenerative perifacet marrow edema on the right. New mild right neuroforaminal stenosis. No spinal canal or left neuroforaminal stenosis. Findings have progressed.   L4-L5: Mild disc bulging. Severe bilateral facet arthropathy. Degenerative perifacet marrow edema on the left. Ligamentum flavum hypertrophy. Worsened moderate spinal canal and bilateral lateral recess stenosis. No neuroforaminal stenosis. Findings  have progressed.   L5-S1: Mild disc bulging with slightly increased superimposed right subarticular disc protrusion compressing the descending right S1 nerve root. Unchanged mild to moderate bilateral facet arthropathy. No spinal canal or neuroforaminal stenosis.   IMPRESSION: 1. Multilevel lumbar spondylosis as described above, progressed at L4-L5 and L5-S1. Worsened moderate spinal canal and bilateral lateral recess stenosis at L4-L5. 2. Slightly increased right subarticular disc protrusion at L5-S1 compressing the descending right S1 nerve root.     Electronically Signed   By: Titus Dubin M.D.   On: 12/18/2020 10:47     Objective:  VS:  HT:    WT:   BMI:     BP:(!) 144/70  HR:77bpm  TEMP: ( )  RESP:  Physical Exam Vitals and nursing note reviewed.  Constitutional:      General: She is not in acute distress.    Appearance: Normal appearance. She is obese. She is not ill-appearing.  HENT:     Head: Normocephalic and atraumatic.     Right Ear: External ear normal.     Left Ear: External ear normal.  Eyes:     Extraocular Movements: Extraocular movements intact.  Cardiovascular:     Rate and Rhythm: Normal rate.     Pulses: Normal pulses.  Pulmonary:     Effort: Pulmonary effort is normal. No respiratory distress.  Abdominal:  General: There is no distension.     Palpations: Abdomen is soft.  Musculoskeletal:        General: Tenderness present.     Cervical back: Neck supple.     Right lower leg: No edema.     Left lower leg: No edema.     Comments: Patient has good distal strength with no pain over the greater trochanters.  No clonus or focal weakness. Patient somewhat slow to rise from a seated position to full extension.  There is concordant low back pain with facet loading and lumbar spine extension rotation.  There are no definitive trigger points but the patient is somewhat tender across the lower back and PSIS.  There is no pain with hip rotation.    Skin:    Findings: No erythema, lesion or rash.  Neurological:     General: No focal deficit present.     Mental Status: She is alert and oriented to person, place, and time.     Sensory: No sensory deficit.     Motor: No weakness or abnormal muscle tone.     Coordination: Coordination normal.  Psychiatric:        Mood and Affect: Mood normal.        Behavior: Behavior normal.     Imaging: No results found.

## 2020-12-09 NOTE — Patient Instructions (Signed)

## 2020-12-09 NOTE — Procedures (Signed)
Lumbar Facet Joint Nerve Denervation  [name]      Date of Birth: 01-28-1951 MRN: [id number] PCP: Isaac Bliss, [name], MD      Visit Date: 12/09/2020   Universal Protocol:    Date/Time: 09/20/223:32 PM  Consent Given By: the patient  Position: PRONE  Additional Comments: Vital signs were monitored before and after the procedure. Patient was prepped and draped in the usual sterile fashion. The correct patient, procedure, and site was verified.   Injection Procedure Details:   Procedure diagnoses:  1. Spondylosis without myelopathy or radiculopathy, lumbar region      Meds Administered:  Meds ordered this encounter  Medications   betamethasone acetate-betamethasone sodium phosphate (CELESTONE) injection 12 mg     Laterality: Right  Location/Site:  L4-L5, L3 and L4 medial branches and L5-S1, L4 medial branch and L5 dorsal ramus  Needle: 18 ga.,  92mm active tip RF Cannula  Needle Placement: Along juncture of superior articular process and transverse pocess  Findings:  -Comments:  Procedure Details: For each desired target nerve, the corresponding transverse process (sacral ala for the L5 dorsal rami) was identified and the fluoroscope was positioned to square off the endplates of the corresponding vertebral body to achieve a true AP midline view.  The beam was then obliqued 15 to 20 degrees and caudally tilted 15 to 20 degrees to line up a trajectory along the target nerves. The skin over the target of the junction of superior articulating process and transverse process (sacral ala for the L5 dorsal rami) was infiltrated with 2ml of 1% Lidocaine without Epinephrine.  The 18 gauge 52mm active tip outer cannula was advanced in trajectory view to the target.  This procedure was repeated for each target nerve.  Then, for all levels, the outer cannula placement was fine-tuned and the position was then confirmed with bi-planar imaging.    Test  stimulation was done both at sensory and motor levels to ensure there was no radicular stimulation. The target tissues were then infiltrated with 1 ml of 1% Lidocaine without Epinephrine. Subsequently, a percutaneous neurotomy was carried out for 90 seconds at 80 degrees Celsius.  After the completion of the lesion, 1 ml of injectate was delivered. It was then repeated for each facet joint nerve mentioned above. Appropriate radiographs were obtained to verify the probe placement during the neurotomy.   Additional Comments:  The patient tolerated the procedure well Dressing: 2 x 2 sterile gauze and Band-Aid    Post-procedure details: Patient was observed during the procedure. Post-procedure instructions were reviewed.  Patient left the clinic in stable condition.

## 2020-12-09 NOTE — Progress Notes (Signed)
Pt state lower back pain. Pt state walking and standing makes the pain worse. Pt state she take over the counter pain meds to help ease her pain.  Numeric Pain Rating Scale and Functional Assessment Average Pain 10   In the last MONTH (on 0-10 scale) has pain interfered with the following?  1. General activity like being  able to carry out your everyday physical activities such as walking, climbing stairs, carrying groceries, or moving a chair?  Rating(10)   +Driver, -BT, -Dye Allergies.

## 2020-12-13 ENCOUNTER — Other Ambulatory Visit: Payer: Self-pay | Admitting: Physical Medicine and Rehabilitation

## 2020-12-14 ENCOUNTER — Other Ambulatory Visit: Payer: Self-pay | Admitting: Physical Medicine and Rehabilitation

## 2020-12-15 ENCOUNTER — Encounter: Payer: Self-pay | Admitting: Physical Medicine and Rehabilitation

## 2020-12-15 MED ORDER — DIAZEPAM 5 MG PO TABS: ORAL_TABLET | ORAL | 0 refills | Status: DC

## 2020-12-15 NOTE — Telephone Encounter (Signed)
Please advise 

## 2020-12-15 NOTE — Telephone Encounter (Signed)
Duplicate request

## 2020-12-16 ENCOUNTER — Other Ambulatory Visit: Payer: No Typology Code available for payment source

## 2020-12-16 ENCOUNTER — Ambulatory Visit
Admission: RE | Admit: 2020-12-16 | Discharge: 2020-12-16 | Disposition: A | Discharge status: Home / Self Care | Payer: No Typology Code available for payment source | Source: Ambulatory Visit | Attending: Physical Medicine and Rehabilitation | Admitting: Physical Medicine and Rehabilitation

## 2020-12-16 ENCOUNTER — Ambulatory Visit: Payer: Self-pay

## 2020-12-16 ENCOUNTER — Ambulatory Visit: Payer: No Typology Code available for payment source | Admitting: Orthopaedic Surgery

## 2020-12-16 ENCOUNTER — Ambulatory Visit: Payer: BC Managed Care – PPO | Admitting: Physical Medicine and Rehabilitation

## 2020-12-16 ENCOUNTER — Encounter: Payer: Self-pay | Admitting: Physical Medicine and Rehabilitation

## 2020-12-16 ENCOUNTER — Other Ambulatory Visit: Payer: Self-pay

## 2020-12-16 VITALS — BP 133/78 | HR 66

## 2020-12-16 DIAGNOSIS — M47816 Spondylosis without myelopathy or radiculopathy, lumbar region: Secondary | ICD-10-CM

## 2020-12-16 DIAGNOSIS — M48062 Spinal stenosis, lumbar region with neurogenic claudication: Secondary | ICD-10-CM

## 2020-12-16 DIAGNOSIS — M5416 Radiculopathy, lumbar region: Secondary | ICD-10-CM

## 2020-12-16 IMAGING — MR MR LUMBAR SPINE W/O CM
4 of 5 series · 25 of 48 positions shown · non-contrast
Comparison: MRI lumbar spine dated [DATE].

CLINICAL DATA: Low back pain radiating into both legs for the past
2 months. Right leg numbness.

EXAM:
MRI LUMBAR SPINE WITHOUT CONTRAST
TECHNIQUE: Multiplanar, multisequence MR imaging of the lumbar spine was
performed. No intravenous contrast was administered.

[Series 3: T2 · sagittal · 4.0mm · 0.53mm/px · 7 of 15 slices shown (1 of 2)]
[im 1/15]
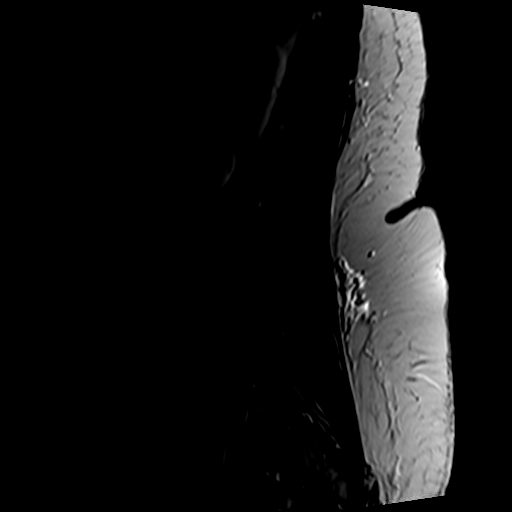
[im 3/15]
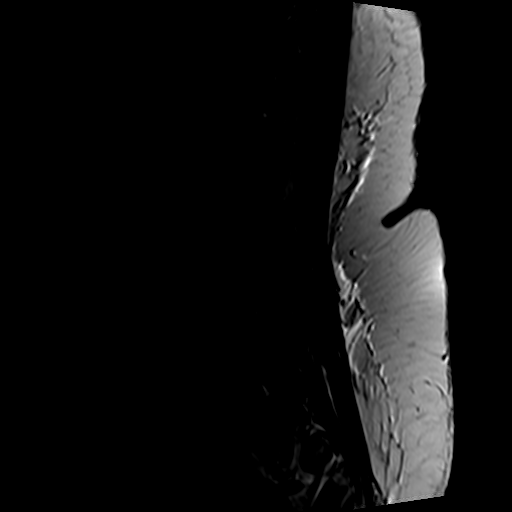
[im 5/15]
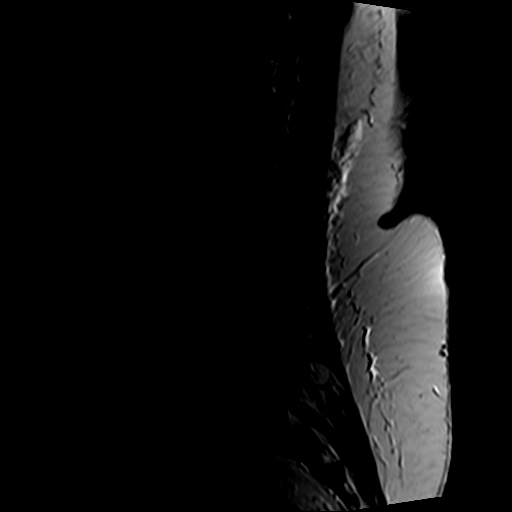
[im 8/15]
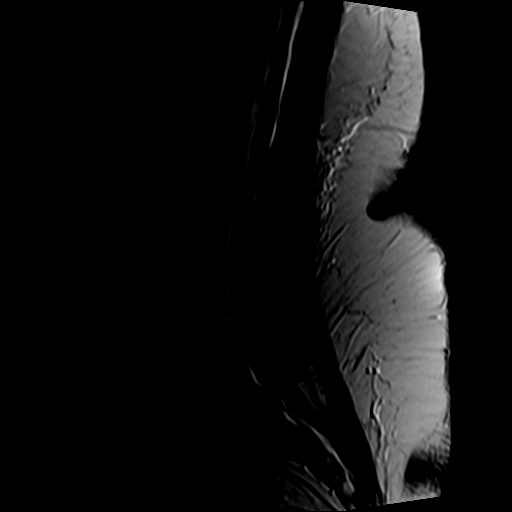
[im 10/15]
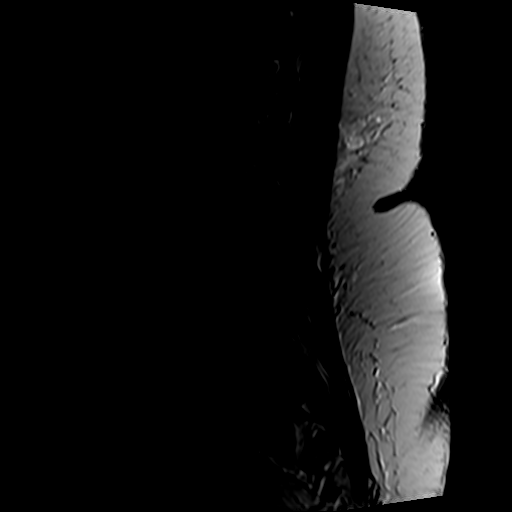
[im 12/15]
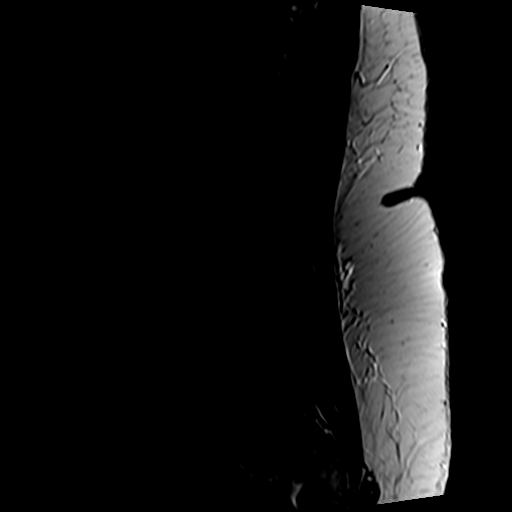
[im 15/15]
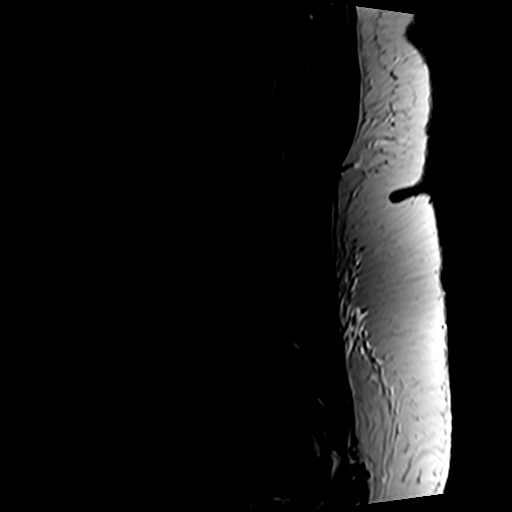

[Series 5: T1 · sagittal · 4.0mm · 0.53mm/px · 6 of 15 slices shown (1 of 2)]
[im 1/15]
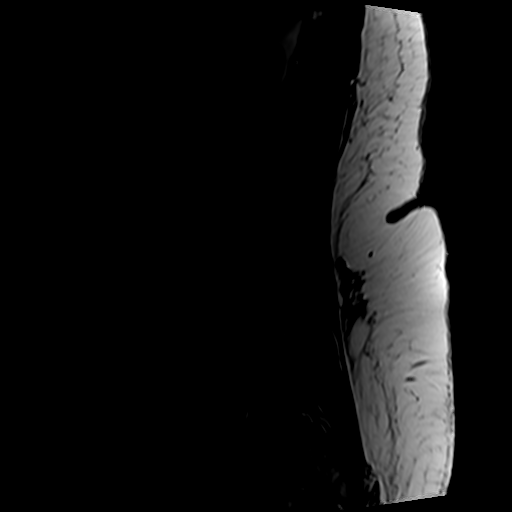
[im 3/15]
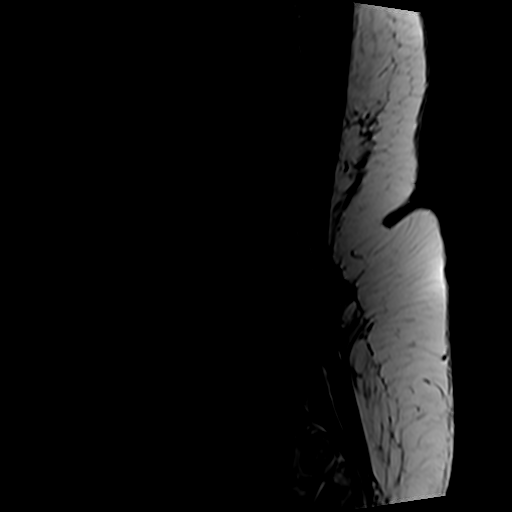
[im 6/15]
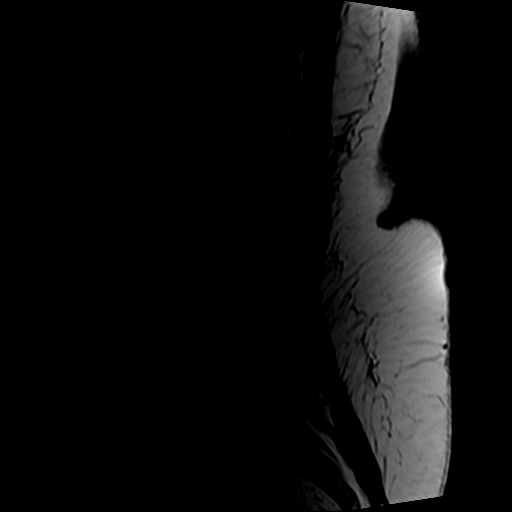
[im 9/15]
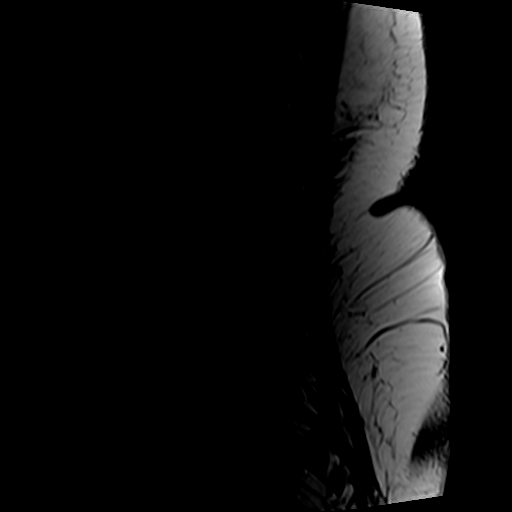
[im 12/15]
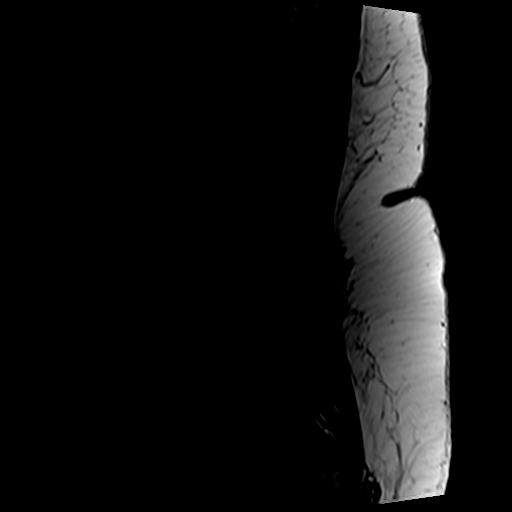
[im 15/15]
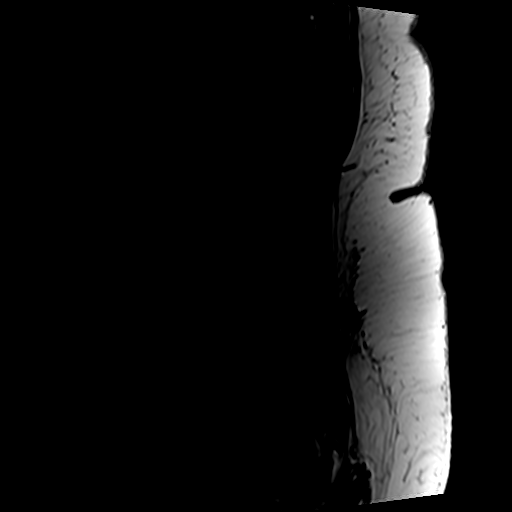

[Series 6: T2 · axial · 4.0mm · 0.70mm/px · z∈[-37,+170]mm · 8 of 33 slices shown (2 of 2)]
[im 1/33]
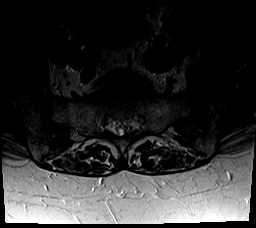
[im 5/33]
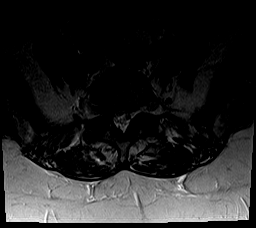
[im 10/33]
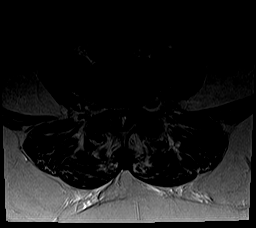
[im 15/33]
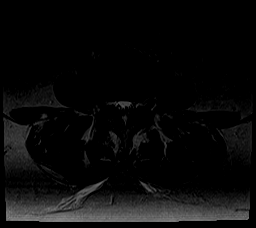
[im 18/33]
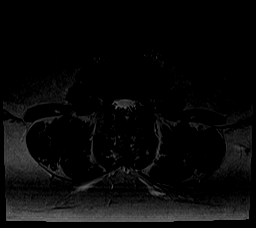
[im 23/33]
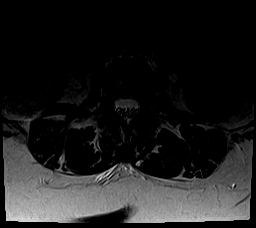
[im 28/33]
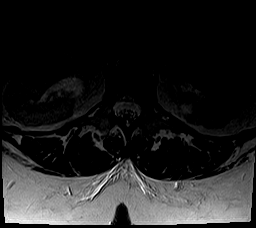
[im 33/33]
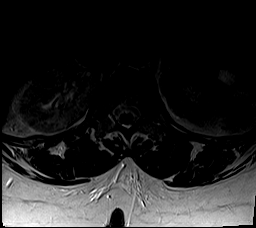

[Series 7: T1 · axial · 4.0mm · 0.35mm/px · z∈[-37,+145]mm · 4 of 33 slices shown (2 of 2)]
[im 1/33]
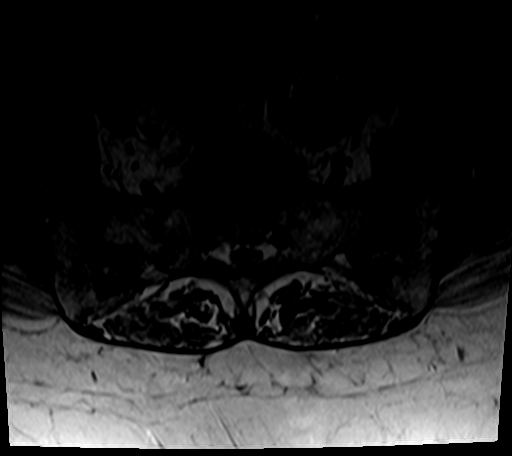
[im 5/33]
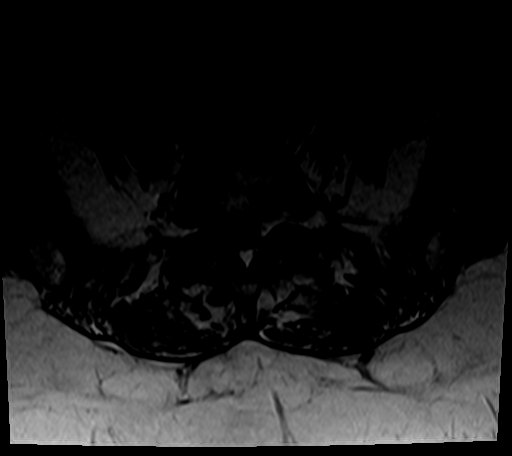
[im 18/33]
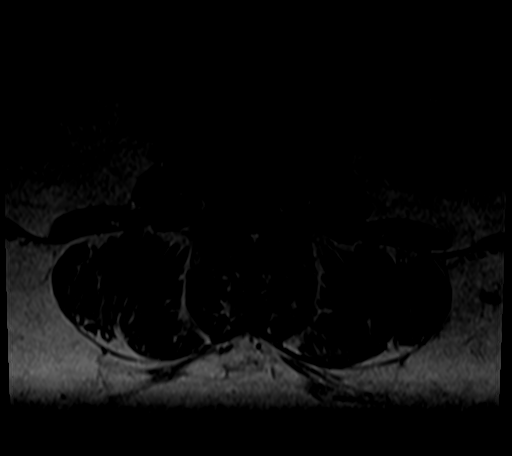
[im 28/33]
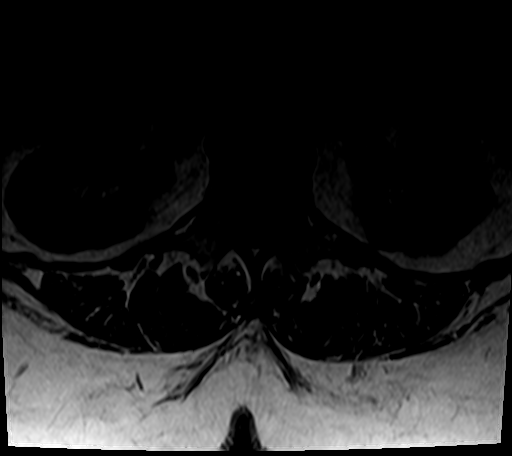

[25 of 48 positions shown; findings below may reference images not displayed]

FINDINGS: Segmentation:  Standard.

Alignment:  New trace anterolisthesis at L4-L5.

Vertebrae:  No fracture, evidence of discitis, or bone lesion.

Conus medullaris and cauda equina: Conus extends to the L1 level.
Conus and cauda equina appear normal.

Paraspinal and other soft tissues: Small left renal cysts. Otherwise
negative.

Disc levels:

T12-L1 to L2-L3:  Negative.

L3-L4: Minimal disc bulging. Moderate right and mild left facet
arthropathy. Degenerative perifacet marrow edema on the right. New
mild right neuroforaminal stenosis. No spinal canal or left
neuroforaminal stenosis. Findings have progressed.

L4-L5: Mild disc bulging. Severe bilateral facet arthropathy.
Degenerative perifacet marrow edema on the left. Ligamentum flavum
hypertrophy. Worsened moderate spinal canal and bilateral lateral
recess stenosis. No neuroforaminal stenosis. Findings have
progressed.

L5-S1: Mild disc bulging with slightly increased superimposed right
subarticular disc protrusion compressing the descending right S1
nerve root. Unchanged mild to moderate bilateral facet arthropathy.
No spinal canal or neuroforaminal stenosis.
IMPRESSION: 1. Multilevel lumbar spondylosis as described above, progressed at
L4-L5 and L5-S1. Worsened moderate spinal canal and bilateral
lateral recess stenosis at L4-L5.
2. Slightly increased right subarticular disc protrusion at L5-S1
compressing the descending right S1 nerve root.

## 2020-12-16 MED ORDER — BETAMETHASONE SOD PHOS & ACET 6 (3-3) MG/ML IJ SUSP
12.0000 mg | Freq: Once | INTRAMUSCULAR | Status: AC
  Administered 2020-12-16: 12 mg

## 2020-12-16 NOTE — Progress Notes (Signed)
Pt state lower back pain. Pt state walking and standing makes the pain worse. Pt state she take over the counter pain meds to help ease her pain.  Numeric Pain Rating Scale and Functional Assessment Average Pain 5   In the last MONTH (on 0-10 scale) has pain interfered with the following?  1. General activity like being  able to carry out your everyday physical activities such as walking, climbing stairs, carrying groceries, or moving a chair?  Rating(6)   +Driver, -BT, -Dye Allergies.

## 2020-12-16 NOTE — Patient Instructions (Signed)

## 2020-12-18 ENCOUNTER — Ambulatory Visit: Payer: BC Managed Care – PPO | Admitting: Orthopaedic Surgery

## 2020-12-18 ENCOUNTER — Telehealth: Payer: Self-pay | Admitting: Physical Medicine and Rehabilitation

## 2020-12-18 ENCOUNTER — Encounter: Payer: Self-pay | Admitting: Orthopaedic Surgery

## 2020-12-18 DIAGNOSIS — M47816 Spondylosis without myelopathy or radiculopathy, lumbar region: Secondary | ICD-10-CM

## 2020-12-18 NOTE — Telephone Encounter (Signed)
Left message #1

## 2020-12-18 NOTE — Telephone Encounter (Signed)
Called patient to advise. She states that she saw Dr. Erlinda Hong today and he referred her to Dr. Lorin Mercy.

## 2020-12-18 NOTE — Telephone Encounter (Signed)
See previous message

## 2020-12-18 NOTE — Telephone Encounter (Signed)
Patient called. Returning a call to [place]. Her call back number is [phone]

## 2020-12-18 NOTE — Telephone Encounter (Signed)
-----   Message from Magnus Sinning, MD sent at 12/18/2020 12:50 PM EDT ----- Regarding: MRI review Let her know I looked at MRI. Slight worsening overal with moderate narrowing of the spine L4-5 and left disc protrusion at L5-S1 and continue OA of the facet joints. We can OV to review. If more left leg pain then could do Left S1 transforaminal injection at some point. There is no indication for mandatory surgery but could consider decompression/discectomy.

## 2020-12-18 NOTE — Progress Notes (Signed)
Office Visit Note   [name]           Date of Birth: [birth year]           MRN: [id number] Visit Date: 12/18/2020              Requested by: Isaac Bliss, [name], MD [place],  [place] [id number] PCP: Isaac Bliss, [name], MD   Assessment & Plan: Visit Diagnoses:  1. Spondylosis without myelopathy or radiculopathy, lumbar region     Plan: I independently reviewed and interpreted the lumbar spine MRI.  She she does have a fair amount of ligamentum flavum enlargement at L4-5 with moderate spinal stenosis and lateral recess stenosis.  She has bulging disc at L4-5 and L5-S1.  Given these findings I have recommended referral to Dr. Lorin Mercy or Louanne Skye for further discussion on treatment options.  For now she is going to hold off on a right knee replacement until she can feel better from the standpoint of her back.  Follow-Up Instructions: Return for needs appt with Dr. Lorin Mercy or Louanne Skye to evaluate spine.   Orders:  No orders of the defined types were placed in this encounter.  No orders of the defined types were placed in this encounter.     Procedures: No procedures performed   Clinical Data: No additional findings.   Subjective: Chief Complaint  Patient presents with   Lower Back - Pain    HPI  Amy Hooper returns today for MRI review of the lumbar spine.  She underwent RFA by Dr. Ernestina Patches this past Tuesday and she has felt some relief.  Review of Systems   Objective: Vital Signs: There were no vitals taken for this visit.  Physical Exam  Ortho Exam  Exam is unchanged.  Specialty Comments:  No specialty comments available.  Imaging: No results found.   PMFS History: Patient Active Problem List   Diagnosis Date Noted   Primary osteoarthritis of left knee 09/08/2020   DJD (degenerative joint disease) of knee 09/08/2020   Status post total left knee replacement 09/08/2020   Spondylosis without myelopathy or  radiculopathy, lumbar region 07/17/2020   Chronic pain syndrome 07/17/2020   Myalgia 07/17/2020   OSA (obstructive sleep apnea) 05/19/2020   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/17/2020   Sleep disturbance 04/17/2020   Chronic pain of both shoulders 03/03/2020   Pain in both hands 03/03/2020   Primary osteoarthritis of both knees 03/03/2020   Pain in both feet 03/03/2020   History of chronic kidney disease 03/03/2020   DDD (degenerative disc disease), lumbar 02/05/2020   Vitamin D deficiency 01/09/2020   Hyperlipidemia 01/09/2020   CKD (chronic kidney disease) stage 3, GFR 30-59 ml/min ([facility]) 01/09/2020   HTN (hypertension)    Morbid obesity ([facility])    Gout    Fibromyalgia    Depression    Insomnia    Past Medical History:  Diagnosis Date   Chronic kidney disease    Depression    Fibromyalgia    Gout    HTN (hypertension)    Insomnia    Morbid obesity ([facility])    Osteoarthritis    Palpitation    Sleep apnea     Family History  Problem Relation Age of Onset   Hypothyroidism Mother    Hyperlipidemia Other    Kidney failure Father    Dementia Father    Gout Father    Arthritis Father    Prostate cancer Father  Past Surgical History:  Procedure Laterality Date   KNEE ARTHROSCOPY     right rotator cuff     ROTATOR CUFF REPAIR Left    TOTAL KNEE ARTHROPLASTY Left 09/08/2020   Procedure: LEFT TOTAL KNEE ARTHROPLASTY;  Surgeon: Leandrew Koyanagi, MD;  Location: [facility];  Service: Orthopedics;  Laterality: Left;   TUBAL LIGATION     Social History   Occupational History   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

## 2020-12-21 ENCOUNTER — Other Ambulatory Visit: Payer: Self-pay | Admitting: Internal Medicine

## 2020-12-21 DIAGNOSIS — I10 Essential (primary) hypertension: Secondary | ICD-10-CM

## 2020-12-21 DIAGNOSIS — M79604 Pain in right leg: Secondary | ICD-10-CM

## 2020-12-21 NOTE — Procedures (Signed)
Lumbar Facet Joint Nerve Denervation  [name]      Date of Birth: October 21, 1950 MRN: [id number] PCP: Isaac Bliss, [name], MD      Visit Date: 12/16/2020   Universal Protocol:    Date/Time: 10/02/223:14 PM  Consent Given By: the patient  Position: PRONE  Additional Comments: Vital signs were monitored before and after the procedure. Patient was prepped and draped in the usual sterile fashion. The correct patient, procedure, and site was verified.   Injection Procedure Details:   Procedure diagnoses:  1. Spondylosis without myelopathy or radiculopathy, lumbar region      Meds Administered:  Meds ordered this encounter  Medications   betamethasone acetate-betamethasone sodium phosphate (CELESTONE) injection 12 mg     Laterality: Left  Location/Site:  L4-L5, L3 and L4 medial branches and L5-S1, L4 medial branch and L5 dorsal ramus  Needle: 18 ga.,  66mm active tip, 113mm RF Cannula  Needle Placement: Along juncture of superior articular process and transverse pocess  Findings:  -Comments:  Procedure Details: For each desired target nerve, the corresponding transverse process (sacral ala for the L5 dorsal rami) was identified and the fluoroscope was positioned to square off the endplates of the corresponding vertebral body to achieve a true AP midline view.  The beam was then obliqued 15 to 20 degrees and caudally tilted 15 to 20 degrees to line up a trajectory along the target nerves. The skin over the target of the junction of superior articulating process and transverse process (sacral ala for the L5 dorsal rami) was infiltrated with 60ml of 1% Lidocaine without Epinephrine.  The 18 gauge 87mm active tip outer cannula was advanced in trajectory view to the target.  This procedure was repeated for each target nerve.  Then, for all levels, the outer cannula placement was fine-tuned and the position was then confirmed with bi-planar imaging.    Test  stimulation was done both at sensory and motor levels to ensure there was no radicular stimulation. The target tissues were then infiltrated with 1 ml of 1% Lidocaine without Epinephrine. Subsequently, a percutaneous neurotomy was carried out for 90 seconds at 80 degrees Celsius.  After the completion of the lesion, 1 ml of injectate was delivered. It was then repeated for each facet joint nerve mentioned above. Appropriate radiographs were obtained to verify the probe placement during the neurotomy.   Additional Comments:  The patient tolerated the procedure well Dressing: 2 x 2 sterile gauze and Band-Aid    Post-procedure details: Patient was observed during the procedure. Post-procedure instructions were reviewed.  Patient left the clinic in stable condition.

## 2020-12-21 NOTE — Progress Notes (Signed)
Amy Hooper - 70 y.o. female MRN [id number]  Date of birth: 1950-12-22  Office Visit Note: Visit Date: 12/16/2020 PCP: Isaac Bliss, [name], MD Referred by: Isaac Bliss, Estel*  Subjective: Chief Complaint  Patient presents with   Lower Back - Pain   HPI:  Amy Hooper is a 70 y.o. female who comes in today for planned radiofrequency ablation of the Left L4-5, L5-S1, and L4-L5 Lumbar facet joints. This would be ablation of the corresponding medial branches and/or dorsal rami.  Patient has had double diagnostic blocks with more than 50% relief.  These are documented on pain diary.  They have had chronic back pain for quite some time, more than 3 months, which has been an ongoing situation with recalcitrant axial back pain.  They have no radicular pain.  Their axial pain is worse with standing and ambulating and on exam today with facet loading.  They have had physical therapy as well as home exercise program.  The imaging noted in the chart below indicated facet pathology. Accordingly they meet all the criteria and qualification for for radiofrequency ablation and we are going to complete this today hopefully for more longer term relief as part of comprehensive management program.   ROS Otherwise per HPI.  Assessment & Plan: Visit Diagnoses:    ICD-10-CM   1. Spondylosis without myelopathy or radiculopathy, lumbar region  M47.816 XR C-ARM NO REPORT    Radiofrequency,Lumbar    betamethasone acetate-betamethasone sodium phosphate (CELESTONE) injection 12 mg      Plan: No additional findings.   Meds & Orders:  Meds ordered this encounter  Medications   betamethasone acetate-betamethasone sodium phosphate (CELESTONE) injection 12 mg    Orders Placed This Encounter  Procedures   Radiofrequency,Lumbar   XR C-ARM NO REPORT    Follow-up: Return if symptoms worsen or fail to improve.   Procedures: No procedures performed  Lumbar Facet Joint Nerve  Denervation  [name]      Date of Birth: [birth year] MRN: [id number] PCP: Isaac Bliss, [name], MD      Visit Date: 12/16/2020   Universal Protocol:    Date/Time: 10/02/223:14 PM  Consent Given By: the patient  Position: PRONE  Additional Comments: Vital signs were monitored before and after the procedure. Patient was prepped and draped in the usual sterile fashion. The correct patient, procedure, and site was verified.   Injection Procedure Details:   Procedure diagnoses:  1. Spondylosis without myelopathy or radiculopathy, lumbar region      Meds Administered:  Meds ordered this encounter  Medications   betamethasone acetate-betamethasone sodium phosphate (CELESTONE) injection 12 mg     Laterality: Left  Location/Site:  L4-L5, L3 and L4 medial branches and L5-S1, L4 medial branch and L5 dorsal ramus  Needle: 18 ga.,  36mm active tip, 156mm RF Cannula  Needle Placement: Along juncture of superior articular process and transverse pocess  Findings:  -Comments:  Procedure Details: For each desired target nerve, the corresponding transverse process (sacral ala for the L5 dorsal rami) was identified and the fluoroscope was positioned to square off the endplates of the corresponding vertebral body to achieve a true AP midline view.  The beam was then obliqued 15 to 20 degrees and caudally tilted 15 to 20 degrees to line up a trajectory along the target nerves. The skin over the target of the junction of superior articulating process and transverse process (sacral ala for the L5 dorsal rami) was infiltrated with 57ml  of 1% Lidocaine without Epinephrine.  The 18 gauge 63mm active tip outer cannula was advanced in trajectory view to the target.  This procedure was repeated for each target nerve.  Then, for all levels, the outer cannula placement was fine-tuned and the position was then confirmed with bi-planar imaging.    Test stimulation was done  both at sensory and motor levels to ensure there was no radicular stimulation. The target tissues were then infiltrated with 1 ml of 1% Lidocaine without Epinephrine. Subsequently, a percutaneous neurotomy was carried out for 90 seconds at 80 degrees Celsius.  After the completion of the lesion, 1 ml of injectate was delivered. It was then repeated for each facet joint nerve mentioned above. Appropriate radiographs were obtained to verify the probe placement during the neurotomy.   Additional Comments:  The patient tolerated the procedure well Dressing: 2 x 2 sterile gauze and Band-Aid    Post-procedure details: Patient was observed during the procedure. Post-procedure instructions were reviewed.  Patient left the clinic in stable condition.       Clinical History: MRI LUMBAR SPINE WITHOUT CONTRAST   TECHNIQUE: Multiplanar, multisequence MR imaging of the lumbar spine was performed. No intravenous contrast was administered.   COMPARISON:  MRI lumbar spine dated September 04, 2016.   FINDINGS: Segmentation:  Standard.   Alignment:  New trace anterolisthesis at L4-L5.   Vertebrae:  No fracture, evidence of discitis, or bone lesion.   Conus medullaris and cauda equina: Conus extends to the L1 level. Conus and cauda equina appear normal.   Paraspinal and other soft tissues: Small left renal cysts. Otherwise negative.   Disc levels:   T12-L1 to L2-L3:  Negative.   L3-L4: Minimal disc bulging. Moderate right and mild left facet arthropathy. Degenerative perifacet marrow edema on the right. New mild right neuroforaminal stenosis. No spinal canal or left neuroforaminal stenosis. Findings have progressed.   L4-L5: Mild disc bulging. Severe bilateral facet arthropathy. Degenerative perifacet marrow edema on the left. Ligamentum flavum hypertrophy. Worsened moderate spinal canal and bilateral lateral recess stenosis. No neuroforaminal stenosis. Findings have progressed.   L5-S1:  Mild disc bulging with slightly increased superimposed right subarticular disc protrusion compressing the descending right S1 nerve root. Unchanged mild to moderate bilateral facet arthropathy. No spinal canal or neuroforaminal stenosis.   IMPRESSION: 1. Multilevel lumbar spondylosis as described above, progressed at L4-L5 and L5-S1. Worsened moderate spinal canal and bilateral lateral recess stenosis at L4-L5. 2. Slightly increased right subarticular disc protrusion at L5-S1 compressing the descending right S1 nerve root.     Electronically Signed   By: Titus Dubin M.D.   On: 12/18/2020 10:47     Objective:  VS:  HT:    WT:   BMI:     BP:133/78  HR:66bpm  TEMP: ( )  RESP:  Physical Exam Vitals and nursing note reviewed.  Constitutional:      General: She is not in acute distress.    Appearance: Normal appearance. She is obese. She is not ill-appearing.  HENT:     Head: Normocephalic and atraumatic.     Right Ear: External ear normal.     Left Ear: External ear normal.  Eyes:     Extraocular Movements: Extraocular movements intact.  Cardiovascular:     Rate and Rhythm: Normal rate.     Pulses: Normal pulses.  Pulmonary:     Effort: Pulmonary effort is normal. No respiratory distress.  Abdominal:     General: There is no  distension.     Palpations: Abdomen is soft.  Musculoskeletal:        General: Tenderness present.     Cervical back: Neck supple.     Right lower leg: No edema.     Left lower leg: No edema.     Comments: Patient has good distal strength with no pain over the greater trochanters.  No clonus or focal weakness. Patient somewhat slow to rise from a seated position to full extension.  There is concordant low back pain with facet loading and lumbar spine extension rotation.  There are no definitive trigger points but the patient is somewhat tender across the lower back and PSIS.  There is no pain with hip rotation.   Skin:    Findings: No erythema,  lesion or rash.  Neurological:     General: No focal deficit present.     Mental Status: She is alert and oriented to person, place, and time.     Sensory: No sensory deficit.     Motor: No weakness or abnormal muscle tone.     Coordination: Coordination normal.  Psychiatric:        Mood and Affect: Mood normal.        Behavior: Behavior normal.     Imaging: No results found.

## 2020-12-26 ENCOUNTER — Other Ambulatory Visit: Payer: Self-pay | Admitting: Internal Medicine

## 2020-12-31 LAB — HM MAMMOGRAPHY

## 2021-01-13 ENCOUNTER — Other Ambulatory Visit: Payer: Self-pay

## 2021-01-13 ENCOUNTER — Ambulatory Visit (INDEPENDENT_AMBULATORY_CARE_PROVIDER_SITE_OTHER): Payer: BC Managed Care – PPO | Admitting: Orthopaedic Surgery

## 2021-01-13 ENCOUNTER — Encounter: Payer: Self-pay | Admitting: Orthopaedic Surgery

## 2021-01-13 ENCOUNTER — Ambulatory Visit: Payer: Self-pay

## 2021-01-13 VITALS — BP 136/84 | Ht 62.0 in | Wt 224.0 lb

## 2021-01-13 DIAGNOSIS — M47816 Spondylosis without myelopathy or radiculopathy, lumbar region: Secondary | ICD-10-CM

## 2021-01-13 DIAGNOSIS — M5126 Other intervertebral disc displacement, lumbar region: Secondary | ICD-10-CM | POA: Diagnosis not present

## 2021-01-13 NOTE — Progress Notes (Signed)
Office Visit Note   [name]           Date of Birth: September 15, 1950           MRN: [id number] Visit Date: 01/13/2021              Requested by: Isaac Bliss, [name], MD [place],  [place] [address] PCP: Isaac Bliss, [name], MD   Assessment & Plan: Visit Diagnoses:  1. Spondylosis without myelopathy or radiculopathy, lumbar region   2. Protrusion of lumbar intervertebral disc     Plan: Patient has progressive back pain with claudication more symptoms on the right leg than left with ambulation or prolonged standing.  We discussed options which would be fusion at the L4-5 level and microdiscectomy on the right at L5-S1.  Patient's been on Lyrica, prednisone, walking program, multiple epidural injections, facet rhizotomy without relief.  She has greater than 6.5 mm shifting at the L4-5 level with flexion extension.  There is some disc protrusion at L5-S1 and likely L5-S1 right microdiscectomy at the time of surgery.  Plan would be overnight stay ambulation with a walker.  We discussed risks of surgery including pseudoarthrosis, problems with screw placement, potential for revision, pseudoarthrosis, repeat surgery.  Dural tear, recurrent disc herniation and progression of degenerative changes at adjacent levels all discussed in detail.  Patient understands and would like to proceed.  Follow-Up Instructions: No follow-ups on file.   Orders:  Orders Placed This Encounter  Procedures   XR Lumbar Spine 2-3 Views   No orders of the defined types were placed in this encounter.     Procedures: No procedures performed   Clinical Data: No additional findings.   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  She has had left total knee arthroplasty which is doing well and also has right knee osteoarthritis.  She has claudication symptoms with ambulation after 1-2 blocks.  She has to lean on a grocery cart when she goes to the store.  She gets relief with sitting.  MRI scan is shown moderate L4-5 spinal stenosis and in the supine position on MRI she was only 2 mm shifted at the L4-5 level.  Patient's had more right leg pain than left.  She did get relief just for a few days with a foraminal injection on the right at the L4 level foramina.  Patient has some sleep apnea morbid obesity with BMI 40.97.  Goal weight for BMI 40 is 218 she is currently at 224 and needs to lose 6 pounds.  She has stage III kidney disease and depression.  Hypertension hyperlipidemia.  Review of Systems positive for claudication symptoms with standing and walking relieved with sitting.  Cardiac positive hypertension, hyperlipidemia.  All other systems noncontributory HPI.   Objective: Vital Signs: BP 136/84 (BP Location: Right Arm, Patient Position: Sitting, Cuff Size: Large)   Ht 5\' 2"  (1.575 m)   Wt 224 lb (101.6 kg)   SpO2 97%   BMI 40.97 kg/m   Physical Exam Constitutional:      Appearance: She is well-developed.  HENT:     Head: Normocephalic.     Right Ear: External ear normal.     Left Ear: External ear normal.  There is no impacted cerumen.  Eyes:     Pupils: Pupils are equal, round, and reactive to light.  Neck:     Thyroid: No thyromegaly.     Trachea: No tracheal deviation.  Cardiovascular:     Rate and Rhythm: Normal rate.  Pulmonary:     Effort: Pulmonary effort is normal.  Abdominal:     Palpations: Abdomen is soft.  Musculoskeletal:     Cervical back: No rigidity.  Skin:    General: Skin is warm and dry.  Neurological:     Mental Status: She is alert and oriented to person, place, and time.  Psychiatric:        Behavior: Behavior normal.    Ortho Exam negative  logroll the hips knees reach full extension.  Well-healed left total knee arthroplasty incision right knee has crepitus with flexion extension.  Palpable pedal pulse.  She is able to heel and toe walk.  Anterior tib gastrocsoleus is strong reflexes are 2+ and symmetrical.  Specialty Comments:  No specialty comments available.  Imaging: XR Lumbar Spine 2-3 Views  Result Date: 01/14/2021 Lateral lumbar flexion-extension views are obtained which shows the shifting in L4-5 from 2 mm to 8.6 mm with flexion extension.  No spondylolisthesis at any other levels. Impression: L4-5 anterolisthesis with greater than 6 mm shift on flexion-extension views.   CLINICAL DATA:  Low back pain radiating into both legs for the past 2 months. Right leg numbness.   EXAM: MRI LUMBAR SPINE WITHOUT CONTRAST   TECHNIQUE: Multiplanar, multisequence MR imaging of the lumbar spine was performed. No intravenous contrast was administered.   COMPARISON:  MRI lumbar spine dated September 04, 2016.   FINDINGS: Segmentation:  Standard.   Alignment:  New trace anterolisthesis at L4-L5.   Vertebrae:  No fracture, evidence of discitis, or bone lesion.   Conus medullaris and cauda equina: Conus extends to the L1 level. Conus and cauda equina appear normal.   Paraspinal and other soft tissues: Small left renal cysts. Otherwise negative.   Disc levels:   T12-L1 to L2-L3:  Negative.   L3-L4: Minimal disc bulging. Moderate right and mild left facet arthropathy. Degenerative perifacet marrow edema on the right. New mild right neuroforaminal stenosis. No spinal canal or left neuroforaminal stenosis. Findings have progressed.   L4-L5: Mild disc bulging. Severe bilateral facet arthropathy. Degenerative perifacet marrow edema on the left. Ligamentum flavum hypertrophy. Worsened moderate spinal canal and bilateral lateral recess stenosis. No neuroforaminal stenosis. Findings have progressed.   L5-S1: Mild disc bulging  with slightly increased superimposed right subarticular disc protrusion compressing the descending right S1 nerve root. Unchanged mild to moderate bilateral facet arthropathy. No spinal canal or neuroforaminal stenosis.   IMPRESSION: 1. Multilevel lumbar spondylosis as described above, progressed at L4-L5 and L5-S1. Worsened moderate spinal canal and bilateral lateral recess stenosis at L4-L5. 2. Slightly increased right subarticular disc protrusion at L5-S1 compressing the descending right S1 nerve root.     Electronically Signed   By: Titus Dubin M.D.   On: 12/18/2020 10:47 PMFS History: Patient Active Problem List   Diagnosis Date Noted   Protrusion of lumbar intervertebral disc 01/14/2021   Primary osteoarthritis of left knee 09/08/2020   DJD (degenerative joint disease) of knee 09/08/2020   Status post total left knee replacement 09/08/2020   Spondylosis without myelopathy or radiculopathy, lumbar region 07/17/2020   Chronic pain syndrome 07/17/2020   Myalgia 07/17/2020   OSA (obstructive sleep apnea) 05/19/2020   Bilateral post-traumatic osteoarthritis  of knee 04/17/2020   Sleep disturbance 04/17/2020   Chronic pain of both shoulders 03/03/2020   Pain in both hands 03/03/2020   Primary osteoarthritis of both knees 03/03/2020   Pain in both feet 03/03/2020   History of chronic kidney disease 03/03/2020   DDD (degenerative disc disease), lumbar 02/05/2020   Vitamin D deficiency 01/09/2020   Hyperlipidemia 01/09/2020   CKD (chronic kidney disease) stage 3, GFR 30-59 ml/min ([facility]) 01/09/2020   HTN (hypertension)    Morbid obesity ([facility])    Gout    Fibromyalgia    Depression    Insomnia    Past Medical History:  Diagnosis Date   Chronic kidney disease    Depression    Fibromyalgia    Gout    HTN (hypertension)    Insomnia    Morbid obesity ([facility])    Osteoarthritis    Palpitation    Sleep apnea     Family History  Problem Relation Age of Onset    Hypothyroidism Mother    Hyperlipidemia Other    Kidney failure Father    Dementia Father    Gout Father    Arthritis Father    Prostate cancer Father     Past Surgical History:  Procedure Laterality Date   KNEE ARTHROSCOPY     right rotator cuff     ROTATOR CUFF REPAIR Left    TOTAL KNEE ARTHROPLASTY Left 09/08/2020   Procedure: LEFT TOTAL KNEE ARTHROPLASTY;  Surgeon: Leandrew Koyanagi, MD;  Location: [facility];  Service: Orthopedics;  Laterality: Left;   TUBAL LIGATION     Social History   Occupational History   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

## 2021-01-14 ENCOUNTER — Ambulatory Visit: Payer: No Typology Code available for payment source | Admitting: Physical Medicine and Rehabilitation

## 2021-01-14 ENCOUNTER — Encounter: Payer: Self-pay | Admitting: Orthopaedic Surgery

## 2021-01-14 DIAGNOSIS — M5126 Other intervertebral disc displacement, lumbar region: Secondary | ICD-10-CM | POA: Insufficient documentation

## 2021-01-15 ENCOUNTER — Encounter: Payer: Self-pay | Admitting: Internal Medicine

## 2021-01-20 NOTE — Telephone Encounter (Signed)
Faxed clearance request to Dr. Candiss Norse.

## 2021-01-21 ENCOUNTER — Other Ambulatory Visit: Payer: Self-pay | Admitting: Internal Medicine

## 2021-01-21 DIAGNOSIS — M79604 Pain in right leg: Secondary | ICD-10-CM

## 2021-02-05 ENCOUNTER — Encounter: Payer: BC Managed Care – PPO | Admitting: Physical Medicine and Rehabilitation

## 2021-02-06 ENCOUNTER — Encounter: Payer: Self-pay | Admitting: Physical Medicine and Rehabilitation

## 2021-02-06 ENCOUNTER — Encounter
Payer: BC Managed Care – PPO | Attending: Physical Medicine and Rehabilitation | Admitting: Physical Medicine and Rehabilitation

## 2021-02-06 ENCOUNTER — Telehealth: Payer: Self-pay

## 2021-02-06 ENCOUNTER — Other Ambulatory Visit: Payer: Self-pay

## 2021-02-06 VITALS — BP 146/78 | HR 71 | Temp 98.4°F | Ht 62.0 in | Wt 220.0 lb

## 2021-02-06 DIAGNOSIS — R635 Abnormal weight gain: Secondary | ICD-10-CM | POA: Diagnosis not present

## 2021-02-06 DIAGNOSIS — F4321 Adjustment disorder with depressed mood: Secondary | ICD-10-CM | POA: Diagnosis not present

## 2021-02-06 DIAGNOSIS — N1831 Chronic kidney disease, stage 3a: Secondary | ICD-10-CM | POA: Insufficient documentation

## 2021-02-06 MED ORDER — TOPIRAMATE 25 MG PO TABS
25.0000 mg | ORAL_TABLET | Freq: Every evening | ORAL | 3 refills | Status: DC
Start: 2021-02-06 — End: 2021-04-07

## 2021-02-06 MED ORDER — PREGABALIN 50 MG PO CAPS: 50.0000 mg | ORAL_CAPSULE | Freq: Three times a day (TID) | ORAL | 3 refills | Status: DC

## 2021-02-06 MED ORDER — RAMELTEON 8 MG PO TABS: 8.0000 mg | ORAL_TABLET | Freq: Every day | ORAL | 3 refills | Status: DC

## 2021-02-06 NOTE — Telephone Encounter (Signed)
PA for Ramel[employer] sent to insurance

## 2021-02-06 NOTE — Patient Instructions (Signed)
Insomnia: -Try to go outside near sunrise -Get exercise during the day.  -Turn off all devices an hour before bedtime.  -Teas that can benefit: chamomile, valerian root, Brahmi (Bacopa) -Can consider over the counter melatonin or magnesium glycinate (latter can also help with constipation) -Pistachios naturally increase the production of melatonin

## 2021-02-06 NOTE — Progress Notes (Signed)
Subjective:    Patient ID: Amy Hooper, female    DOB: 05-07-1950, 70 y.o.   MRN: [id number]  HPI Female with past medical history/past surgical history of OSA, morbid obesity, hypertension, fibromyalgia, depression, gout, CKD, degenerative disc disease-lumbar, bilateral knee surgeries for torn mensici, bilateral shoulder surgeries for rotator cuff presents with bilateral L > R knee pain.  Initially stated: Started ~1990. After a fall on her knees.  Progressively getting worse.  Steroid injections improve the pain along with brace.  Ambulation exacerbates the pain.  Moving after prolonged postures exacerbates the pain.  Achy.  Radiates down leg.  Intermittent. Denies associated weakness, numbness.  Tylenol. 1 fall summer of 2021 falling backward in chair.  Pain limits ambulation.  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 She has had bilateral L4-5 lumbar epidural steroid injections.  She has also had facet injections.  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  Irritation of S1 nerve roots.  She had a left knee steroid injection on 03/18/2020 with Ortho.  She is seen rheumatology as well.  No reviewed from rheumatology, orthopedic surgeon, physiatrist, neurology-plan for knee replacement in December 2022. She does note that she is improving with weight loss and dietary changes.    She saw pain management since that time and is scheduled for MBB.  Since last visit, she states she has had great improvement with pool therapy. She states she is hungry. She had good benefits with Lyrica. She has not seen Nephro yet. She is awaiting CPAP.  She has bad fibromyalgia flares when the weather changes- especially when it rains. She was started on Neurontin by Dr. Posey Pronto and it does help on a daily basis. She had relief with Celebrex well before but had to stop due to CKD. She  has never tried steroids.   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   Situational depression: She is going through changes with her family. They are all living in her house and everyone is arguing. She does not want to get herself worked up due to her hypertension. Her family brings her in a lot. Her husband says why don't you say anything. She felt a car coming across her yesterday and she screamed. She feels overwhelmed. Her mother may have undiagnosed mental illness. Her mom called th police on her husband.   Severe arthritis and bulging discs: -she has a lot of pain and nothing helps it. If she sits too long it hurts and if she walks to long it hurts -Lyrica and steroids help her pain -she is planing to have surgery.   Weight gain  Not voiding as much as she used to -check Creatinine today.   Pain Inventory Average Pain 8 Pain Right Now 5 My pain is aching, stabbing  In the last 24 hours, has pain interfered with the following? General activity 8 Relation with others 8 Enjoyment of life 8 What TIME of day is your pain at its worst? 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     Social Determinants of Health   Financial Resource Strain: Not on file  Food Insecurity: Not on file  Transportation Needs: Not on file  Physical Activity: Not on file  Stress: Not on file  Social Connections: Not on file   Past Surgical History:  Procedure Laterality Date   KNEE ARTHROSCOPY     right rotator cuff     ROTATOR CUFF REPAIR Left    TOTAL KNEE ARTHROPLASTY Left 09/08/2020   Procedure: LEFT TOTAL KNEE ARTHROPLASTY;  Surgeon: Leandrew Koyanagi, MD;  Location: [facility];  Service: Orthopedics;  Laterality: Left;   TUBAL LIGATION     Past Medical History:  Diagnosis Date   Chronic kidney disease    Depression    Fibromyalgia    Gout    HTN (hypertension)    Insomnia    Morbid obesity ([place])    Osteoarthritis    Palpitation    Sleep apnea    There were no vitals taken for this visit.  Opioid Risk Score:   Fall Risk Score:  `1  Depression screen PHQ 2/9  Depression screen [facility] 2/9 11/07/2020 04/22/2020 04/17/2020 01/08/2020  Decreased Interest 0 0 0 0  Down, Depressed, Hopeless 0 0 0 0  PHQ - 2 Score 0 0 0 0  Altered sleeping - 1 3 0  Tired, decreased energy - 0 0 0  Change in appetite - 0 0 0  Feeling bad or failure about yourself  - 0 0 0  Trouble concentrating - 0 0 0  Moving slowly or fidgety/restless - 0 0 0  Suicidal thoughts - 0 0 0  PHQ-9 Score - 1 3 0  Difficult doing work/chores - Not difficult at all Not difficult at all Not difficult at all   Review of Systems  Constitutional: Negative.   HENT: Negative.    Eyes: Negative.   Respiratory: Negative.    Cardiovascular: Negative.   Gastrointestinal: Negative.   Endocrine: Negative.   Genitourinary: Negative.   Musculoskeletal:  Positive for arthralgias, back pain and gait problem. Negative for joint swelling, myalgias, neck pain and neck stiffness.       Pain in both legs  Allergic/Immunologic:  Negative.   Hematological: Negative.   All other systems reviewed and are negative.    Objective:   Physical Exam  Gen: no distress, normal appearing HEENT: oral mucosa pink and moist, NCAT Cardio: Reg rate Chest: normal effort, normal rate of breathing Abd: soft, non-distended Ext: no edema Psych: pleasant, normal affect Skin: intact Musculoskeletal:  Scattered points of tenderness, improving Neuro: Alert Motor: 5/5 throughout b/l LE Sensation intact to light touch    Assessment & Plan:  Female with past medical history/past surgical history of OSA, morbid obesity,  hypertension, fibromyalgia, depression, gout, CKD, degenerative disc disease-lumbar, bilateral knee surgeries for torn mensici, bilateral shoulder surgeries for rotator cuff presents with bilateral L > R knee pain.   1. Bilateral knee OA - endstage  Patient was supposed to have knee replacements last year, but due to insurance changed to summer 2022  Endstage OA in b/l knee per Ortho, no films available  No benefit with Heat/Cold, PT (~2019), Robaxin, Flexaril, Tizanidine  Continue bracing prn (limit on right knee - educated)  Continue Voltaren gel  Lidocaine patch OTC, does not want to try, wanted to focus on generalized pain  Continue Cymbalta   Patient states main goal is to ambulate  Wants to buy a pool - afraid of public locations due to Co[place]  Recommended follow up with Nephro regarding Chondroitin sulfate, appointment next month  Tolerating mild exercise - limited by back   Continue antiinflammatory diet -Discussed following foods that may reduce pain: 1) Ginger (especially studied for arthritis)- reduce leukotriene production to decrease inflammation 2) Blueberries- high in phytonutrients that decrease inflammation 3) Salmon- marine omega-3s reduce joint swelling and pain 4) Pumpkin seeds- reduce inflammation 5) dark chocolate- reduces inflammation 6) turmeric- reduces inflammation 7) tart cherries -  reduce pain and stiffness 8) extra virgin olive oil - its compound olecanthal helps to block prostaglandins  9) chili peppers- can be eaten or applied topically via capsaicin 10) mint- helpful for headache, muscle aches, joint pain, and itching 11) garlic- reduces inflammation  Link to further information on diet for chronic pain: http://[email or web address].randall.com/   2. Sleep disturbance  Continue Lunesta per PCP  Continue to await CPAP  Insomnia: -Try to go outside near sunrise -Get exercise during the day.  -Turn off all devices an hour before bedtime.  -Teas that can benefit: chamomile, valerian root, Brahmi (Bacopa) -Can consider over the counter melatonin or magnesium glycinate (latter can also help with constipation) -Pistachios naturally increase the production of melatonin     3. Morbid Obesity  Continue follow up with dietitian  Encouraged weight loss  4. Myalgia   Will consider trigger point injections  5. Fibromyalgia  Continue  aquatic therapy - great benefit  Encouraged ROM, stretching  Continue Cymbalta  Decrease Lyrica to 50mg  TID

## 2021-02-07 LAB — BASIC METABOLIC PANEL
BUN/Creatinine Ratio: 15 (ref 12–28)
BUN: 23 mg/dL (ref 8–27)
CO2: 24 mmol/L (ref 20–29)
Calcium: 10.1 mg/dL (ref 8.7–10.3)
Chloride: 105 mmol/L (ref 96–106)
Creatinine, Ser: 1.51 mg/dL — ABNORMAL HIGH (ref 0.57–1.00)
Glucose: 85 mg/dL (ref 70–99)
Potassium: 4.6 mmol/L (ref 3.5–5.2)
Sodium: 142 mmol/L (ref 134–144)
eGFR: 38 mL/min/{1.73_m2} — ABNORMAL LOW (ref >59)

## 2021-02-10 NOTE — Telephone Encounter (Signed)
PA Case: [id number], Status: Denied.

## 2021-02-16 ENCOUNTER — Encounter: Payer: Self-pay | Admitting: Orthopaedic Surgery

## 2021-02-17 NOTE — Progress Notes (Signed)
Office Visit Note  [name]             Date of Birth: 19-Mar-1951           MRN: [id number]             PCP: Isaac Bliss, [name], MD Referring: Isaac Bliss, [place] [facility]* Visit Date: 03/02/2021 Occupation: @GUAROCC @  Subjective:  Bilateral hand swelling   History of Present Illness: Amy Hooper is a 70 y.o. female with a history of osteoarthritis, degenerative disc disease and positive rheumatoid factor.  She states she continues to have pain and stiffness in her bilateral hands especially in the morning.  She has not seen any visible swelling.  She also notices some cramps in her hands.  She has been taking magnesium malate without much results.  She underwent left total knee replacement by Dr.Xu in June 2022.  She is doing quite well after the surgery.  She will be undergoing lumbar spine fusion by Dr. Lorin Mercy next week.  She will need right total knee replacement in the future.  She states her fibromyalgia symptoms are better managed with the addition of Lyrica.  She has not had a gout flare.  She has been taking allopurinol 200 mg daily.  Activities of Daily Living:  Patient reports morning stiffness for 10 minutes.   Patient Reports nocturnal pain.  Difficulty dressing/grooming: Reports Difficulty climbing stairs: Reports Difficulty getting out of chair: Reports Difficulty using hands for taps, buttons, cutlery, and/or writing: Reports  Review of Systems  Constitutional:  Negative for fatigue, night sweats, weight gain and weight loss.  HENT:  Negative for mouth sores, trouble swallowing, trouble swallowing, mouth dryness and nose dryness.   Eyes:  Negative for pain, redness, visual disturbance and dryness.  Respiratory:  Negative for cough, shortness of breath and difficulty breathing.   Cardiovascular:  Negative for chest pain, palpitations, hypertension, irregular heartbeat and swelling in legs/feet.  Gastrointestinal:  Negative for blood in  stool, constipation and diarrhea.  Endocrine: Positive for cold intolerance. Negative for increased urination.  Genitourinary:  Negative for difficulty urinating and vaginal dryness.  Musculoskeletal:  Positive for joint pain, joint pain, joint swelling, morning stiffness and muscle tenderness. Negative for myalgias, muscle weakness and myalgias.  Skin:  Negative for color change, rash, hair loss, skin tightness, ulcers and sensitivity to sunlight.  Allergic/Immunologic: Negative for susceptible to infections.  Neurological:  Negative for dizziness, numbness, memory loss, night sweats and weakness.  Hematological:  Negative for bruising/bleeding tendency and swollen glands.  Psychiatric/Behavioral:  Negative for depressed mood and sleep disturbance. The patient is not nervous/anxious.    PMFS History:  Patient Active Problem List   Diagnosis Date Noted   Degenerative spondylolisthesis 02/19/2021   Protrusion of lumbar intervertebral disc 01/14/2021   Primary osteoarthritis of left knee 09/08/2020   DJD (degenerative joint disease) of knee 09/08/2020   Status post total left knee replacement 09/08/2020   Spondylosis without myelopathy or radiculopathy, lumbar region 07/17/2020   Chronic pain syndrome 07/17/2020   Myalgia 07/17/2020   OSA (obstructive sleep apnea) 05/19/2020   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/17/2020   Sleep disturbance 04/17/2020   Chronic pain of both shoulders 03/03/2020   Pain in both hands 03/03/2020   Primary osteoarthritis of both knees 03/03/2020   Pain in both feet 03/03/2020   History of chronic kidney disease 03/03/2020   DDD (degenerative disc disease), lumbar 02/05/2020   Vitamin D deficiency 01/09/2020   Hyperlipidemia 01/09/2020  CKD (chronic kidney disease) stage 3, GFR 30-59 ml/min (HCC) 01/09/2020   HTN (hypertension)    Morbid obesity (HCC)    Gout    Fibromyalgia    Depression    Insomnia     Past Medical History:   Diagnosis Date   Chronic kidney disease    Depression    Fibromyalgia    Gout    HTN (hypertension)    Insomnia    Morbid obesity (HCC)    Osteoarthritis    Palpitation    Sleep apnea     Family History  Problem Relation Age of Onset   Hypothyroidism Mother    Hyperlipidemia Other    Kidney failure Father    Dementia Father    Gout Father    Arthritis Father    Prostate cancer Father    Past Surgical History:  Procedure Laterality Date   KNEE ARTHROSCOPY     right rotator cuff     ROTATOR CUFF REPAIR Left    TOTAL KNEE ARTHROPLASTY Left 09/08/2020   Procedure: LEFT TOTAL KNEE ARTHROPLASTY;  Surgeon: Leandrew Koyanagi, MD;  Location: [facility];  Service: Orthopedics;  Laterality: Left;   TUBAL LIGATION     Social History   Social History Narrative   Lives gives with fiance.     Immunization History  Administered Date(s) Administered   Influenza-Unspecified 01/31/2020, 01/13/2021   Moderna Sars-Covid-2 Vaccination 05/02/2019, 05/30/2019, 01/23/2020   Pneumococcal Conjugate-13 06/05/2018   Pneumococcal Polysaccharide-23 01/08/2020   Tdap 01/08/2020     Objective: Vital Signs: BP 136/68 (BP Location: Left Arm, Patient Position: Sitting, Cuff Size: Normal)   Pulse 62   Resp 16   Ht 5\' 2"  (1.575 m)   Wt 217 lb (98.4 kg)   BMI 39.69 kg/m    Physical Exam Vitals and nursing note reviewed.  Constitutional:      Appearance: She is well-developed.  HENT:     Head: Normocephalic and atraumatic.  Eyes:     Conjunctiva/sclera: Conjunctivae normal.  Cardiovascular:     Rate and Rhythm: Normal rate and regular rhythm.     Heart sounds: Normal heart sounds.  Pulmonary:     Effort: Pulmonary effort is normal.     Breath sounds: Normal breath sounds.  Abdominal:     General: Bowel sounds are normal.     Palpations: Abdomen is soft.  Musculoskeletal:     Cervical back: Normal range of motion.  Lymphadenopathy:     Cervical: No cervical adenopathy.  Skin:    General:  Skin is warm and dry.     Capillary Refill: Capillary refill takes less than 2 seconds.  Neurological:     Mental Status: She is alert and oriented to person, place, and time.  Psychiatric:        Behavior: Behavior normal.     Musculoskeletal Exam: C-spine was in good range of motion.  She had painful limited range of motion of the lumbar spine.  She had discomfort range of motion of right shoulder joint.  Her shoulder joints were in full range of motion.  Elbow joints were in good range of motion.  She good range of motion of bilateral wrist joints with no tenderness.  She had tenderness over some of her MCP joints as described below.  No synovitis was noted.  Hip joints with good range of motion.  She had painful range of motion of her right knee joint and difficulty walking.  Left replaced knee joint was in  good range of motion without discomfort.  There was no tenderness over ankles or MTPs.  CDAI Exam: CDAI Score: 4.9  Patient Global: 6 mm; Provider Global: 3 mm Swollen: 0 ; Tender: 4  Joint Exam 03/02/2021      Right  Left  MCP 2   Tender   Tender  MCP 3   Tender     MCP 4   Tender        Investigation: No additional findings.  Imaging: No results found.  Recent Labs: Lab Results  Component Value Date   WBC 7.6 09/02/2020   HGB 12.2 09/02/2020   PLT 366 09/02/2020   NA 142 02/06/2021   K 4.6 02/06/2021   CL 105 02/06/2021   CO2 24 02/06/2021   GLUCOSE 85 02/06/2021   BUN 23 02/06/2021   CREATININE 1.51 (H) 02/06/2021   BILITOT 0.3 09/02/2020   ALKPHOS 92 09/02/2020   AST 17 09/02/2020   ALT 15 09/02/2020   PROT 7.4 09/02/2020   ALBUMIN 3.7 09/02/2020   CALCIUM 10.1 02/06/2021   GFRAA 56 (L) 02/05/2020    Speciality Comments: No specialty comments available.  Procedures:  No procedures performed Allergies: Cefuroxime axetil, Clarithromycin, Nsaids, Cefuroxime, Hydrocodone, Oxycodone-acetaminophen, Toradol [ketorolac tromethamine], and Tramadol    Assessment / Plan:     Visit Diagnoses: Rheumatoid arthritis with rheumatoid factor of multiple sites without organ or systems involvement (HCC) -rheumatoid factor , anti-CCP negative.  She continues to have pain and stiffness in her hands.  She states she has lot of discomfort in the morning with swelling.  Pain improves with time.  She has intermittent increased swelling.  She has difficulty making a fist.  She had tenderness over MCPs but no synovitis was noted.  I discussed the option of trying hydroxychloroquine.  She was in agreement.  A handout was given and consent was taken.  Side effects were discussed at length.  She has low GFR.  Based on her low GFR her dose of hydroxychloroquine will be 200 mg p.o. daily.  I will also check hydroxychloroquine level 3 months after starting the dose.  She was advised to get a baseline eye examination and then eye examination on any yearly basis to monitor for ocular toxicity.  She is scheduled for lumbar spine surgery next week.  I advised her to start on hydroxychloroquine 2 weeks after the surgery if she is healing well and there is no infection.  Patient was counseled on the purpose, proper use, and adverse effects of hydroxychloroquine including nausea/diarrhea, skin rash, headaches, and sun sensitivity.  Advised patient to wear sunscreen once starting hydroxychloroquine to reduce risk of rash associated with sun sensitivity.  Discussed importance of annual eye exams while on hydroxychloroquine to monitor to ocular toxicity and discussed importance of frequent laboratory monitoring.  Provided patient with eye exam form for baseline ophthalmologic exam.  Reviewed risk for QTC prolongation when used in combination with other QTc prolonging agents (including but not limited to antiarrhythmics, macrolide antibiotics, flouroquinolones, tricyclic antidepressants, citalopram, specific antipsychotics, ondansetron, migraine triptans, and methadone). Provided patient  with educational materials on hydroxychloroquine and answered all questions.  Patient consented to hydroxychloroquine. Will upload consent in the media tab.    High risk medication use - Plan: CBC with Differential/Platelet, COMPLETE METABOLIC PANEL WITH GFR, Glucose 6 phosphate dehydrogenase 1 month after starting hydroxychloroquine.  We will monitor labs every 3 months.  I will also check hydroxychloroquine level when she has been on Plaquenil for 3 months.  Information regarding the vaccination was placed in the AVS.  Chronic pain of both shoulders - rotator cuff tear of bilateral shoulders.  She states she had right rotator cuff tear surgery in May 2021.  She continues to have some discomfort in her right shoulder.  Pain in both hands-she complains of pain and discomfort in her bilateral hands and intermittent swelling.  Primary osteoarthritis of right knee - Diagnosed with end-stage osteoarthritis in the past.  She is planning to undergo right total knee replacement in the future.  She has difficulty walking and discomfort in her right knee joint.  Status post total knee replacement, left - By Dr.Xu in June 2022.  Doing well.  Pain in both feet-she had no tenderness on palpation of her ankles or MTPs.  DDD (degenerative disc disease), lumbar - She is planning to undergo spinal fusion next week by Dr. Lorin Mercy.  Idiopathic chronic gout of multiple sites without tophus - allopurinol 200 mg p.o. daily.  Patient denies having any gout flare.- Plan: Uric acid  Other medical problems are listed as follows:  Fibromyalgia-she states her fibromyalgia symptoms are better controlled since she has been on Lyrica.  Other fatigue - CK and TSH are normal.  Osteopenia of multiple sites-followed by her PCP.  History of chronic kidney disease - GFR in 48s.She is followed at [facility]  Primary hypertension-blood pressure is normal.  Vitamin D deficiency  Other insomnia  History of  hyperlipidemia  History of depression  Orders: Orders Placed This Encounter  Procedures   CBC with Differential/Platelet   COMPLETE METABOLIC PANEL WITH GFR   Glucose 6 phosphate dehydrogenase   Uric acid   Ambulatory referral to Ophthalmology    Meds ordered this encounter  Medications   hydroxychloroquine (PLAQUENIL) 200 MG tablet    Sig: Take 1 tablet (200 mg total) by mouth daily.    Dispense:  90 tablet    Refill:  0      Follow-Up Instructions: Return in about 2 months (around 05/03/2021) for Rheumatoid arthritis, Osteoarthritis, Gout.   Bo Merino, MD  Note - This record has been created using Editor, commissioning.  Chart creation errors have been sought, but may not always  have been located. Such creation errors do not reflect on  the standard of medical care.

## 2021-02-18 ENCOUNTER — Ambulatory Visit (INDEPENDENT_AMBULATORY_CARE_PROVIDER_SITE_OTHER): Payer: BC Managed Care – PPO | Admitting: Orthopaedic Surgery

## 2021-02-18 ENCOUNTER — Encounter: Payer: Self-pay | Admitting: Orthopaedic Surgery

## 2021-02-18 ENCOUNTER — Other Ambulatory Visit: Payer: Self-pay

## 2021-02-18 VITALS — BP 143/83 | HR 61 | Wt 217.0 lb

## 2021-02-18 DIAGNOSIS — M47816 Spondylosis without myelopathy or radiculopathy, lumbar region: Secondary | ICD-10-CM

## 2021-02-18 DIAGNOSIS — M5126 Other intervertebral disc displacement, lumbar region: Secondary | ICD-10-CM

## 2021-02-18 DIAGNOSIS — M4310 Spondylolisthesis, site unspecified: Secondary | ICD-10-CM | POA: Diagnosis not present

## 2021-02-18 NOTE — Progress Notes (Signed)
Office Visit Note   [name]           Date of Birth: 1951-01-21           MRN: [id number] Visit Date: 02/18/2021              Requested by: Isaac Bliss, [name], MD [facility] [place],   [id number] PCP: Isaac Bliss, [name], MD   Assessment & Plan: Visit Diagnoses:  1. Protrusion of lumbar intervertebral disc   2. Spondylosis without myelopathy or radiculopathy, lumbar region   3. Degenerative spondylolisthesis            With stenosis L4-5  Plan: Patient with L4-5 stenosis shifting 6.5 mm.  In the MRI scan supine position she is reduced and only has moderate stenosis but with standing she undoubtedly has severe or critical stenosis consistent with her claudication symptoms.  She has slight increase in size right subarticular disc protrusion L5-S1 and plan would be single level fusion at L4-5 instrumented with local bone interbody cage and microdiscectomy at the L5-S1 level on the right side.  Risks of recurrent disc herniation possible need for later fusion at L5-S1, pseudoarthrosis, cage migration, dural tear and repair, reoperation, pedicle screw placement problems requiring repeat surgery all the discussed explained.  Questions were elicited and answered she understands request to proceed.  Patient is lost weight and has reached her goal weight so that she can have surgery.  Follow-Up Instructions: No follow-ups on file.   Orders:  No orders of the defined types were placed in this encounter.  No orders of the defined types were placed in this encounter.     Procedures: No procedures performed   Clinical Data: No additional findings.   Subjective: Chief Complaint  Patient presents with   Lower Back - Follow-up    HPI 70 year old female 214 pounds is reached her target weight and she gradually loses weight so that she could get her lumbar surgery.  Patient has instability L4-5 with shifting of 6.5 mm from flexion  extension with stenosis and also moderate disc protrusion at L5-S1 on the right.  Patient is lost 10 pounds in the last month.  She has past history of stage III kidney disease depression hypertension hyperlipidemia.  Previous left total knee arthroplasty doing well.  Patient has claudication symptoms with standing stable to walk about 1 block, sits for 5 minutes and then can repeat.  She has to have a grocery cart to make it through the store.  She gets relief with sitting and supine position.  Review of Systems 14 point system update unchanged from 01/13/2021 office visit.   Objective: Vital Signs: BP (!) 143/83 (BP Location: Left Arm, Patient Position: Sitting, Cuff Size: Large)   Pulse 61   Wt 217 lb (98.4 kg)   SpO2 97%   BMI 39.69 kg/m   Physical Exam Constitutional:      Appearance: She is well-developed.  HENT:     Head: Normocephalic.     Right Ear: External ear normal.     Left Ear: External ear normal. There is no impacted cerumen.  Eyes:     Pupils: Pupils are equal, round, and reactive to light.  Neck:     Thyroid: No thyromegaly.     Trachea: No tracheal deviation.  Cardiovascular:     Rate and Rhythm: Normal rate.  Pulmonary:     Effort: Pulmonary effort is normal.  Abdominal:     Palpations:  Abdomen is soft.  Musculoskeletal:     Cervical back: No rigidity.  Skin:    General: Skin is warm and dry.  Neurological:     Mental Status: She is alert and oriented to person, place, and time.  Psychiatric:        Behavior: Behavior normal.    Ortho Exam negative logroll of the hips reflexes 2+ and symmetrical pulses are normal.  She is able heel and toe walk.  Anterior tib gastrocsoleus peroneals are strong.  Well-healed knee arthroplasty incision full extension flexion past 100 degrees.  Specialty Comments:  No specialty comments available.  Imaging: Narrative & Impression  CLINICAL DATA:  Low back pain radiating into both legs for the past 2 months. Right leg  numbness.   EXAM: MRI LUMBAR SPINE WITHOUT CONTRAST   TECHNIQUE: Multiplanar, multisequence MR imaging of the lumbar spine was performed. No intravenous contrast was administered.   COMPARISON:  MRI lumbar spine dated September 04, 2016.   FINDINGS: Segmentation:  Standard.   Alignment:  New trace anterolisthesis at L4-L5.   Vertebrae:  No fracture, evidence of discitis, or bone lesion.   Conus medullaris and cauda equina: Conus extends to the L1 level. Conus and cauda equina appear normal.   Paraspinal and other soft tissues: Small left renal cysts. Otherwise negative.   Disc levels:   T12-L1 to L2-L3:  Negative.   L3-L4: Minimal disc bulging. Moderate right and mild left facet arthropathy. Degenerative perifacet marrow edema on the right. New mild right neuroforaminal stenosis. No spinal canal or left neuroforaminal stenosis. Findings have progressed.   L4-L5: Mild disc bulging. Severe bilateral facet arthropathy. Degenerative perifacet marrow edema on the left. Ligamentum flavum hypertrophy. Worsened moderate spinal canal and bilateral lateral recess stenosis. No neuroforaminal stenosis. Findings have progressed.   L5-S1: Mild disc bulging with slightly increased superimposed right subarticular disc protrusion compressing the descending right S1 nerve root. Unchanged mild to moderate bilateral facet arthropathy. No spinal canal or neuroforaminal stenosis.   IMPRESSION: 1. Multilevel lumbar spondylosis as described above, progressed at L4-L5 and L5-S1. Worsened moderate spinal canal and bilateral lateral recess stenosis at L4-L5. 2. Slightly increased right subarticular disc protrusion at L5-S1 compressing the descending right S1 nerve root.     Electronically Signed   By: Titus Dubin M.D.   On: 12/18/2020 10:47     PMFS History: Patient Active Problem List   Diagnosis Date Noted   Degenerative spondylolisthesis 02/19/2021   Protrusion of lumbar  intervertebral disc 01/14/2021   Primary osteoarthritis of left knee 09/08/2020   DJD (degenerative joint disease) of knee 09/08/2020   Status post total left knee replacement 09/08/2020   Spondylosis without myelopathy or radiculopathy, lumbar region 07/17/2020   Chronic pain syndrome 07/17/2020   Myalgia 07/17/2020   OSA (obstructive sleep apnea) 05/19/2020   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/17/2020   Sleep disturbance 04/17/2020   Chronic pain of both shoulders 03/03/2020   Pain in both hands 03/03/2020   Primary osteoarthritis of both knees 03/03/2020   Pain in both feet 03/03/2020   History of chronic kidney disease 03/03/2020   DDD (degenerative disc disease), lumbar 02/05/2020   Vitamin D deficiency 01/09/2020   Hyperlipidemia 01/09/2020   CKD (chronic kidney disease) stage 3, GFR 30-59 ml/min ([facility]) 01/09/2020   HTN (hypertension)    Morbid obesity ([place])    Gout    Fibromyalgia    Depression    Insomnia    Past Medical History:  Diagnosis Date  Chronic kidney disease    Depression    Fibromyalgia    Gout    HTN (hypertension)    Insomnia    Morbid obesity ([facility])    Osteoarthritis    Palpitation    Sleep apnea     Family History  Problem Relation Age of Onset   Hypothyroidism Mother    Hyperlipidemia Other    Kidney failure Father    Dementia Father    Gout Father    Arthritis Father    Prostate cancer Father     Past Surgical History:  Procedure Laterality Date   KNEE ARTHROSCOPY     right rotator cuff     ROTATOR CUFF REPAIR Left    TOTAL KNEE ARTHROPLASTY Left 09/08/2020   Procedure: LEFT TOTAL KNEE ARTHROPLASTY;  Surgeon: Leandrew Koyanagi, MD;  Location: [facility];  Service: Orthopedics;  Laterality: Left;   TUBAL LIGATION     Social History   Occupational History   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

## 2021-02-19 DIAGNOSIS — M4310 Spondylolisthesis, site unspecified: Secondary | ICD-10-CM | POA: Insufficient documentation

## 2021-02-23 ENCOUNTER — Other Ambulatory Visit: Payer: Self-pay

## 2021-02-25 ENCOUNTER — Ambulatory Visit (INDEPENDENT_AMBULATORY_CARE_PROVIDER_SITE_OTHER): Payer: BC Managed Care – PPO | Admitting: Surgery

## 2021-02-25 ENCOUNTER — Encounter: Payer: Self-pay | Admitting: Surgery

## 2021-02-25 ENCOUNTER — Other Ambulatory Visit: Payer: Self-pay

## 2021-02-25 DIAGNOSIS — M5126 Other intervertebral disc displacement, lumbar region: Secondary | ICD-10-CM

## 2021-02-25 NOTE — Progress Notes (Signed)
70 year old black female with history of L4-5 and L5-S1 HNP/stenosis comes in for preop evaluation.  States that symptoms unchanged from previous visit.  She is wanting to proceed with RIGHT L4-5 TRANSFORAMINAL LUMBAR INTERBODY FUSION, PEDICLE INSTRUMENTATION, CAGE, LATERAL FUSION, RIGHT L5-S1 MICRODISCECTOMY as scheduled.  Today history physical performed.  Review of systems negative.  We have received nephrology clearance from Dr. Candiss Norse.  Patient has a history of sleep apnea and uses CPAP.  Surgical seizure discussed along with potential rehab/recovery time.  All questions answered.  I did asked patient to bring her CPAP mask and hose to the hospital to use during admission.

## 2021-02-27 ENCOUNTER — Other Ambulatory Visit: Payer: Self-pay | Admitting: Internal Medicine

## 2021-03-02 ENCOUNTER — Other Ambulatory Visit: Payer: Self-pay

## 2021-03-02 ENCOUNTER — Ambulatory Visit (INDEPENDENT_AMBULATORY_CARE_PROVIDER_SITE_OTHER): Payer: BC Managed Care – PPO | Admitting: Rheumatology

## 2021-03-02 ENCOUNTER — Encounter: Payer: Self-pay | Admitting: Rheumatology

## 2021-03-02 VITALS — BP 136/68 | HR 62 | Resp 16 | Ht 62.0 in | Wt 217.0 lb

## 2021-03-02 DIAGNOSIS — Z8659 Personal history of other mental and behavioral disorders: Secondary | ICD-10-CM

## 2021-03-02 DIAGNOSIS — R768 Other specified abnormal immunological findings in serum: Secondary | ICD-10-CM

## 2021-03-02 DIAGNOSIS — M25511 Pain in right shoulder: Secondary | ICD-10-CM

## 2021-03-02 DIAGNOSIS — I10 Essential (primary) hypertension: Secondary | ICD-10-CM

## 2021-03-02 DIAGNOSIS — M0579 Rheumatoid arthritis with rheumatoid factor of multiple sites without organ or systems involvement: Secondary | ICD-10-CM

## 2021-03-02 DIAGNOSIS — M51369 Other intervertebral disc degeneration, lumbar region without mention of lumbar back pain or lower extremity pain: Secondary | ICD-10-CM

## 2021-03-02 DIAGNOSIS — M1A09X0 Idiopathic chronic gout, multiple sites, without tophus (tophi): Secondary | ICD-10-CM

## 2021-03-02 DIAGNOSIS — M797 Fibromyalgia: Secondary | ICD-10-CM

## 2021-03-02 DIAGNOSIS — M79671 Pain in right foot: Secondary | ICD-10-CM

## 2021-03-02 DIAGNOSIS — E559 Vitamin D deficiency, unspecified: Secondary | ICD-10-CM

## 2021-03-02 DIAGNOSIS — M5136 Other intervertebral disc degeneration, lumbar region: Secondary | ICD-10-CM

## 2021-03-02 DIAGNOSIS — Z87448 Personal history of other diseases of urinary system: Secondary | ICD-10-CM

## 2021-03-02 DIAGNOSIS — M170 Bilateral primary osteoarthritis of knee: Secondary | ICD-10-CM

## 2021-03-02 DIAGNOSIS — M79641 Pain in right hand: Secondary | ICD-10-CM | POA: Diagnosis not present

## 2021-03-02 DIAGNOSIS — M25512 Pain in left shoulder: Secondary | ICD-10-CM

## 2021-03-02 DIAGNOSIS — Z79899 Other long term (current) drug therapy: Secondary | ICD-10-CM | POA: Diagnosis not present

## 2021-03-02 DIAGNOSIS — G4709 Other insomnia: Secondary | ICD-10-CM

## 2021-03-02 DIAGNOSIS — Z8639 Personal history of other endocrine, nutritional and metabolic disease: Secondary | ICD-10-CM

## 2021-03-02 DIAGNOSIS — M79672 Pain in left foot: Secondary | ICD-10-CM

## 2021-03-02 DIAGNOSIS — M1711 Unilateral primary osteoarthritis, right knee: Secondary | ICD-10-CM

## 2021-03-02 DIAGNOSIS — M8589 Other specified disorders of bone density and structure, multiple sites: Secondary | ICD-10-CM

## 2021-03-02 DIAGNOSIS — R5383 Other fatigue: Secondary | ICD-10-CM

## 2021-03-02 DIAGNOSIS — M79642 Pain in left hand: Secondary | ICD-10-CM

## 2021-03-02 DIAGNOSIS — Z96652 Presence of left artificial knee joint: Secondary | ICD-10-CM

## 2021-03-02 DIAGNOSIS — G8929 Other chronic pain: Secondary | ICD-10-CM

## 2021-03-02 MED ORDER — HYDROXYCHLOROQUINE SULFATE 200 MG PO TABS
200.0000 mg | ORAL_TABLET | Freq: Every day | ORAL | 0 refills | Status: DC
Start: 2021-03-02 — End: 2021-11-17

## 2021-03-02 NOTE — Progress Notes (Signed)
Pharmacy Note  Subjective: Patient presents today to [facility] for follow up office visit.   Patient seen by the pharmacist for counseling on hydroxychloroquine for rheumatoid arthritis.    Objective: CMP     Component Value Date/Time   NA 142 02/06/2021 1114   K 4.6 02/06/2021 1114   CL 105 02/06/2021 1114   CO2 24 02/06/2021 1114   GLUCOSE 85 02/06/2021 1114   GLUCOSE 85 09/02/2020 0853   BUN 23 02/06/2021 1114   CREATININE 1.51 (H) 02/06/2021 1114   CREATININE 1.17 (H) 02/05/2020 0902   CALCIUM 10.1 02/06/2021 1114   PROT 7.4 09/02/2020 0853   ALBUMIN 3.7 09/02/2020 0853   AST 17 09/02/2020 0853   ALT 15 09/02/2020 0853   ALKPHOS 92 09/02/2020 0853   BILITOT 0.3 09/02/2020 0853   GFRNONAA 36 (L) 09/02/2020 0853   GFRNONAA 49 (L) 02/05/2020 0902   GFRAA 56 (L) 02/05/2020 0902    CBC    Component Value Date/Time   WBC 7.6 09/02/2020 0853   RBC 4.16 09/02/2020 0853   HGB 12.2 09/02/2020 0853   HCT 37.4 09/02/2020 0853   PLT 366 09/02/2020 0853   MCV 89.9 09/02/2020 0853   MCH 29.3 09/02/2020 0853   MCHC 32.6 09/02/2020 0853   RDW 13.3 09/02/2020 0853   LYMPHSABS 2.5 09/02/2020 0853   MONOABS 0.7 09/02/2020 0853   EOSABS 0.3 09/02/2020 0853   BASOSABS 0.0 09/02/2020 0853    Assessment/Plan: Patient was counseled on the purpose, proper use, and adverse effects of hydroxychloroquine including nausea/diarrhea, skin rash, headaches, and sun sensitivity.  Advised patient to wear sunscreen once starting hydroxychloroquine to reduce risk of rash associated with sun sensitivity.  Discussed importance of annual eye exams while on hydroxychloroquine to monitor to ocular toxicity and discussed importance of frequent laboratory monitoring.  Provided patient with eye exam form for baseline ophthalmologic exam.  Reviewed risk for QTC prolongation when used in combination with other QTc prolonging agents (including but not limited to antiarrhythmics, macrolide  antibiotics, flouroquinolones, tricyclic antidepressants, citalopram, specific antipsychotics, ondansetron, migraine triptans, and methadone). Provided patient with educational materials on hydroxychloroquine and answered all questions.  Patient consented to hydroxychloroquine. Will upload consent in the media tab.    Dose will be Plaquenil 200 mg once daily.  She will start the medication 2 weeks after her upcoming surgery. Prescription sent to pharmacy today  Knox Saliva, PharmD, MPH, BCPS Clinical Pharmacist (Rheumatology and Pulmonology)

## 2021-03-02 NOTE — Progress Notes (Signed)
Surgical Instructions    Your procedure is scheduled on Monday, December 19th, 2022.   Report to [facility] Entrance "A" at 05:30 A.M., then check in with the Admitting office.  Call this number if you have problems the morning of surgery:  [phone]   If you have any questions prior to your surgery date call [phone]: Open Monday-Friday 8am-4pm    Remember:  Do not eat after midnight the night before your surgery  You may drink clear liquids until 04:30 the morning of your surgery.   Clear liquids allowed are: Water, Non-Citrus Juices (without pulp), Carbonated Beverages, Clear Tea, Black Coffee ONLY (NO MILK, CREAM OR POWDERED CREAMER of any kind), and Gatorade    Take these medicines the morning of surgery with A SIP OF WATER:  allopurinol (ZYLOPRIM) amLODipine (NORVASC)  hydroxychloroquine (PLAQUENIL)   pregabalin (LYRICA)    If needed:  fluticasone (FLONASE) predniSONE (DELTASONE) tiZANidine (ZANAFLEX)  loratadine (CLARITIN)  As of today, STOP taking any Aspirin (unless otherwise instructed by your surgeon) Aleve, Naproxen, Ibuprofen, Motrin, Advil, Goody's, BC's, all herbal medications, fish oil, and all vitamins.    After your COVID test   You are not required to quarantine however you are required to wear a well-fitting mask when you are out and around people not in your household.  If your mask becomes wet or soiled, replace with a new one.  Wash your hands often with soap and water for 20 seconds or clean your hands with an alcohol-based hand sanitizer that contains at least 60% alcohol.  Do not share personal items.  Notify your provider: if you are in close contact with someone who has COVID  or if you develop a fever of 100.4 or greater, sneezing, cough, sore throat, shortness of breath or body aches.    The day of surgery:          Do not wear jewelry or makeup Do not wear lotions, powders, perfumes, or deodorant. Do not shave 48 hours  prior to surgery.   Do not bring valuables to the hospital. DO Not wear nail polish, gel polish, artificial nails, or any other type of covering on natural nails including finger and toenails. If patients have artificial nails, gel coating, etc. that need to be removed by a nail salon, please have this removed prior to surgery or surgery may need to be canceled/delayed if the surgeon/ anesthesia feels like the patient is unable to be adequately monitored.              [facility] is not responsible for any belongings or valuables.  Do NOT Smoke (Tobacco/Vaping)  24 hours prior to your procedure  If you use a CPAP at night, you may bring your mask for your overnight stay.   Contacts, glasses, hearing aids, dentures or partials may not be worn into surgery, please bring cases for these belongings   For patients admitted to the hospital, discharge time will be determined by your treatment team.   Patients discharged the day of surgery will not be allowed to drive home, and someone needs to stay with them for 24 hours.  NO VISITORS WILL BE ALLOWED IN PRE-OP WHERE PATIENTS ARE PREPPED FOR SURGERY.  ONLY 1 SUPPORT PERSON MAY BE PRESENT IN THE WAITING ROOM WHILE YOU ARE IN SURGERY.  IF YOU ARE TO BE ADMITTED, ONCE YOU ARE IN YOUR ROOM YOU WILL BE ALLOWED TWO (2) VISITORS. 1 (ONE) VISITOR MAY STAY OVERNIGHT BUT MUST ARRIVE TO  THE ROOM BY 8pm.  Minor children may have two parents present. Special consideration for safety and communication needs will be reviewed on a case by case basis.  Special instructions:    Oral Hygiene is also important to reduce your risk of infection.  Remember - BRUSH YOUR TEETH THE MORNING OF SURGERY WITH YOUR REGULAR TOOTHPASTE   [facility]- Preparing For Surgery  Before surgery, you can play an important role. Because skin is not sterile, your skin needs to be as free of germs as possible. You can reduce the number of germs on your skin by washing with CHG  (chlorahexidine gluconate) Soap before surgery.  CHG is an antiseptic cleaner which kills germs and bonds with the skin to continue killing germs even after washing.     Please do not use if you have an allergy to CHG or antibacterial soaps. If your skin becomes reddened/irritated stop using the CHG.  Do not shave (including legs and underarms) for at least 48 hours prior to first CHG shower. It is OK to shave your face.  Please follow these instructions carefully.     Shower the NIGHT BEFORE SURGERY and the MORNING OF SURGERY with CHG Soap.   If you chose to wash your hair, wash your hair first as usual with your normal shampoo. After you shampoo, rinse your hair and body thoroughly to remove the shampoo.  Then [employer] and genitals (private parts) with your normal soap and rinse thoroughly to remove soap.  After that Use CHG Soap as you would any other liquid soap. You can apply CHG directly to the skin and wash gently with a scrungie or a clean washcloth.   Apply the CHG Soap to your body ONLY FROM THE NECK DOWN.  Do not use on open wounds or open sores. Avoid contact with your eyes, ears, mouth and genitals (private parts). Wash Face and genitals (private parts)  with your normal soap.   Wash thoroughly, paying special attention to the area where your surgery will be performed.  Thoroughly rinse your body with warm water from the neck down.  DO NOT shower/wash with your normal soap after using and rinsing off the CHG Soap.  Pat yourself dry with a CLEAN TOWEL.  Wear CLEAN PAJAMAS to bed the night before surgery  Place CLEAN SHEETS on your bed the night before your surgery  DO NOT SLEEP WITH PETS.   Day of Surgery:  Take a shower with CHG soap. Wear Clean/Comfortable clothing the morning of surgery Do not apply any deodorants/lotions.   Remember to brush your teeth WITH YOUR REGULAR TOOTHPASTE.   Please read over the following fact sheets that you were given.

## 2021-03-02 NOTE — Patient Instructions (Signed)
Hydroxychloroquine Tablets What is this medication? HYDROXYCHLOROQUINE (hye drox ee KLOR oh kwin) treats autoimmune conditions, such as rheumatoid arthritis and lupus. It works by slowing down an overactive immune system. It may also be used to prevent and treat malaria. It works by killing the parasite that causes malaria. It belongs to a group of medications called DMARDs. This medicine may be used for other purposes; ask your health care provider or pharmacist if you have questions. COMMON BRAND NAME(S): Plaquenil, Quineprox What should I tell my care team before I take this medication? They need to know if you have any of these conditions: Diabetes Eye disease, vision problems G6PD deficiency Heart disease History of irregular heartbeat If you often drink alcohol Kidney disease Liver disease Porphyria Psoriasis An unusual or allergic reaction to chloroquine, hydroxychloroquine, other medications, foods, dyes, or preservatives Pregnant or trying to get pregnant Breast-feeding How should I use this medication? Take this medication by mouth with a glass of water. Take it as directed on the prescription label. Do not cut, crush or chew this medication. Swallow the tablets whole. Take it with food. Do not take it more than directed. Take all of this medication unless your care team tells you to stop it early. Keep taking it even if you think you are better. Take products with antacids in them at a different time of day than this medication. Take this medication 4 hours before or 4 hours after antacids. Talk to your care team if you have questions. Talk to your care team about the use of this medication in children. While this medication may be prescribed for selected conditions, precautions do apply. Overdosage: If you think you have taken too much of this medicine contact a poison control center or emergency room at once. NOTE: This medicine is only for you. Do not share this medicine with  others. What if I miss a dose? If you miss a dose, take it as soon as you can. If it is almost time for your next dose, take only that dose. Do not take double or extra doses. What may interact with this medication? Do not take this medication with any of the following: Cisapride Dronedarone Pimozide Thioridazine This medication may also interact with the following: Ampicillin Antacids Cimetidine Cyclosporine Digoxin Kaolin Medications for diabetes, like insulin, glipizide, glyburide Medications for seizures like carbamazepine, phenobarbital, phenytoin Mefloquine Methotrexate Other medications that prolong the QT interval (cause an abnormal heart rhythm) Praziquantel This list may not describe all possible interactions. Give your health care provider a list of all the medicines, herbs, non-prescription drugs, or dietary supplements you use. Also tell them if you smoke, drink alcohol, or use illegal drugs. Some items may interact with your medicine. What should I watch for while using this medication? Visit your care team for regular checks on your progress. Tell your care team if your symptoms do not start to get better or if they get worse. You may need blood work done while you are taking this medication. If you take other medications that can affect heart rhythm, you may need more testing. Talk to your care team if you have questions. Your vision may be tested before and during use of this medication. Tell your care team right away if you have any change in your eyesight. This medication may cause serious skin reactions. They can happen weeks to months after starting the medication. Contact your care team right away if you notice fevers or flu-like symptoms with a rash. The   rash may be red or purple and then turn into blisters or peeling of the skin. Or, you might notice a red rash with swelling of the face, lips or lymph nodes in your neck or under your arms. If you or your family  notice any changes in your behavior, such as new or worsening depression, thoughts of harming yourself, anxiety, or other unusual or disturbing thoughts, or memory loss, call your care team right away. What side effects may I notice from receiving this medication? Side effects that you should report to your care team as soon as possible: Allergic reactions--skin rash, itching, hives, swelling of the face, lips, tongue, or throat Aplastic anemia--unusual weakness or fatigue, dizziness, headache, trouble breathing, increased bleeding or bruising Change in vision Heart rhythm changes--fast or irregular heartbeat, dizziness, feeling faint or lightheaded, chest pain, trouble breathing Infection--fever, chills, cough, or sore throat Low blood sugar (hypoglycemia)--tremors or shaking, anxiety, sweating, cold or clammy skin, confusion, dizziness, rapid heartbeat Muscle injury--unusual weakness or fatigue, muscle pain, dark yellow or brown urine, decrease in amount of urine Pain, tingling, or numbness in the hands or feet Rash, fever, and swollen lymph nodes Redness, blistering, peeling, or loosening of the skin, including inside the mouth Thoughts of suicide or self-harm, worsening mood, or feelings of depression Unusual bruising or bleeding Side effects that usually do not require medical attention (report to your care team if they continue or are bothersome): Diarrhea Headache Nausea Stomach pain Vomiting This list may not describe all possible side effects. Call your doctor for medical advice about side effects. You may report side effects to FDA at 1-800-FDA-1088. Where should I keep my medication? Keep out of the reach of children and pets. Store at room temperature up to 30 degrees C (86 degrees F). Protect from light. Get rid of any unused medication after the expiration date. To get rid of medications that are no longer needed or have expired: Take the medication to a medication take-back  program. Check with your pharmacy or law enforcement to find a location. If you cannot return the medication, check the label or package insert to see if the medication should be thrown out in the garbage or flushed down the toilet. If you are not sure, ask your care team. If it is safe to put it in the trash, empty the medication out of the container. Mix the medication with cat litter, dirt, coffee grounds, or other unwanted substance. Seal the mixture in a bag or container. Put it in the trash. NOTE: This sheet is a summary. It may not cover all possible information. If you have questions about this medicine, talk to your doctor, pharmacist, or health care provider. Standing Labs We placed an order today for your standing lab work.   Please have your standing labs drawn in in 1 month after starting hydroxychloroquine, then every 3 months  If possible, please have your labs drawn 2 weeks prior to your appointment so that the provider can discuss your results at your appointment.  Please note that you may see your imaging and lab results in [facility] before we have reviewed them. We may be awaiting multiple results to interpret others before contacting you. Please allow our office up to 72 hours to thoroughly review all of the results before contacting the office for clarification of your results.  We have open lab daily: Monday through Thursday from 1:30-4:30 PM and Friday from 1:30-4:00 PM at the office of Dr. Bo Merino, [facility].  Please be advised, all patients with office appointments requiring lab work will take precedent over walk-in lab work.  If possible, please come for your lab work on Monday and Friday afternoons, as you may experience shorter wait times. The office is located at [address] [place], [place], [place] [address] No appointment is necessary.   Labs are drawn by [facility]. Please bring your co-pay at the time of your lab draw.  You may receive a  bill from [facility] for your lab work.  If you wish to have your labs drawn at another location, please call the office 24 hours in advance to send orders.  If you have any questions regarding directions or hours of operation,  please call [phone].   As a reminder, please drink plenty of water prior to coming for your lab work. Thanks!   Vaccines You are taking a medication(s) that can suppress your immune system.  The following immunizations are recommended: Flu annually Covid-19  Td/Tdap (tetanus, diphtheria, pertussis) every 10 years Pneumonia (Prevnar 15 then Pneumovax 23 at least 1 year apart.  Alternatively, can take Prevnar 20 without needing additional dose) Shingrix: 2 doses from 4 weeks to 6 months apart  Please check with your PCP to make sure you are up to date.

## 2021-03-03 ENCOUNTER — Encounter (HOSPITAL_COMMUNITY)
Admission: RE | Admit: 2021-03-03 | Discharge: 2021-03-03 | Disposition: A | Discharge status: Home / Self Care | Payer: BC Managed Care – PPO | Source: Ambulatory Visit | Attending: Orthopaedic Surgery | Admitting: Orthopaedic Surgery

## 2021-03-03 ENCOUNTER — Encounter (HOSPITAL_COMMUNITY): Payer: Self-pay

## 2021-03-03 ENCOUNTER — Other Ambulatory Visit: Payer: Self-pay

## 2021-03-03 VITALS — BP 171/79 | HR 75 | Temp 98.7°F | Resp 17 | Ht 62.0 in | Wt 218.0 lb

## 2021-03-03 DIAGNOSIS — Z01812 Encounter for preprocedural laboratory examination: Secondary | ICD-10-CM | POA: Diagnosis not present

## 2021-03-03 DIAGNOSIS — Z01818 Encounter for other preprocedural examination: Secondary | ICD-10-CM

## 2021-03-03 LAB — CBC
HCT: 36.4 % (ref 36.0–46.0)
Hemoglobin: 12 g/dL (ref 12.0–15.0)
MCH: 28.6 pg (ref 26.0–34.0)
MCHC: 33 g/dL (ref 30.0–36.0)
MCV: 86.9 fL (ref 80.0–100.0)
Platelets: 313 10*3/uL (ref 150–400)
RBC: 4.19 MIL/uL (ref 3.87–5.11)
RDW: 13.2 % (ref 11.5–15.5)
WBC: 4.6 10*3/uL (ref 4.0–10.5)
nRBC: 0 % (ref 0.0–0.2)

## 2021-03-03 LAB — BASIC METABOLIC PANEL
Anion gap: 7 (ref 5–15)
BUN: 15 mg/dL (ref 8–23)
CO2: 27 mmol/L (ref 22–32)
Calcium: 9.6 mg/dL (ref 8.9–10.3)
Chloride: 108 mmol/L (ref 98–111)
Creatinine, Ser: 1.38 mg/dL — ABNORMAL HIGH (ref 0.44–1.00)
GFR, Estimated: 42 mL/min — ABNORMAL LOW (ref >60)
Glucose, Bld: 88 mg/dL (ref 70–99)
Potassium: 3.8 mmol/L (ref 3.5–5.1)
Sodium: 142 mmol/L (ref 135–145)

## 2021-03-03 LAB — TYPE AND SCREEN
ABO/RH(D): O POS
Antibody Screen: NEGATIVE

## 2021-03-03 LAB — SURGICAL PCR SCREEN
MRSA, PCR: NEGATIVE
Staphylococcus aureus: NEGATIVE

## 2021-03-03 NOTE — Progress Notes (Addendum)
PCP - Isaac Bliss, [place], MD Cardiologist - denies  PPM/ICD - denies Device Orders - n/a Rep Notified - n/a  Chest x-ray - 09/02/2020 EKG - 09/02/2020 Stress Test - denies ECHO - denies Cardiac Cath - denies  Sleep Study - yes - OSA CPAP - yes  Fasting Blood Sugar - n/a Blood Thinner Instructions: n/a  Aspirin Instructions: Patient was instructed: As of today, STOP taking any Aspirin (unless otherwise instructed by your surgeon) Aleve, Naproxen, Ibuprofen, Motrin, Advil, Goody's, BC's, all herbal medications, fish oil, and all vitamins.  ERAS Protcol - yes PRE-SURGERY Ensure or G2- n/a  COVID TEST- the test will be done on 03/06/2021 @ 12:15   Anesthesia review: no  BP in PAT when patient arrived 171/79. BP decreased to 149/80 at the end of appointment.   Patient denies shortness of breath, fever, cough and chest pain at PAT appointment   All instructions explained to the patient, with a verbal understanding of the material. Patient agrees to go over the instructions while at home for a better understanding. Patient also instructed to self quarantine after being tested for COVID-19. The opportunity to ask questions was provided.

## 2021-03-05 ENCOUNTER — Encounter: Payer: Self-pay | Admitting: Internal Medicine

## 2021-03-05 ENCOUNTER — Ambulatory Visit (INDEPENDENT_AMBULATORY_CARE_PROVIDER_SITE_OTHER): Payer: BC Managed Care – PPO | Admitting: Internal Medicine

## 2021-03-05 ENCOUNTER — Other Ambulatory Visit (HOSPITAL_COMMUNITY)
Admission: RE | Admit: 2021-03-05 | Discharge: 2021-03-05 | Disposition: A | Discharge status: Home / Self Care | Payer: BC Managed Care – PPO | Source: Ambulatory Visit | Attending: Internal Medicine | Admitting: Internal Medicine

## 2021-03-05 VITALS — BP 120/80

## 2021-03-05 DIAGNOSIS — Z124 Encounter for screening for malignant neoplasm of cervix: Secondary | ICD-10-CM | POA: Diagnosis not present

## 2021-03-05 NOTE — Progress Notes (Signed)
Established Patient Office Visit     This visit occurred during the SARS-CoV-2 public health emergency.  Safety protocols were in place, including screening questions prior to the visit, additional usage of staff PPE, and extensive cleaning of exam room while observing appropriate contact time as indicated for disinfecting solutions.    CC/Reason for Visit: I need a Pap smear  HPI: Amy Hooper is a 70 y.o. female who is coming in today for the above mentioned reasons.  During her last physical she preferred to defer her Pap smear.  She is scheduled this visit for today.  Since I last saw her she has had her left knee replaced without complications.  She will be having back surgery on Monday.  She is otherwise doing well and has no complaints.  Past Medical/Surgical History: Past Medical History:  Diagnosis Date   Chronic kidney disease    Depression    Fibromyalgia    Gout    HTN (hypertension)    Insomnia    Morbid obesity (HCC)    Osteoarthritis    Palpitation    Sleep apnea     Past Surgical History:  Procedure Laterality Date   JOINT REPLACEMENT     KNEE ARTHROSCOPY     right rotator cuff     ROTATOR CUFF REPAIR Left    TOTAL KNEE ARTHROPLASTY Left 09/08/2020   Procedure: LEFT TOTAL KNEE ARTHROPLASTY;  Surgeon: Leandrew Koyanagi, MD;  Location: [facility];  Service: Orthopedics;  Laterality: Left;   TUBAL LIGATION      Social History:  reports that she has never smoked. She has never used smokeless tobacco. She reports that she does not drink alcohol and does not use drugs.  Allergies: Allergies  Allergen Reactions   Cefuroxime Axetil Rash and Hives   Clarithromycin Rash and Hives    Other reaction(s): Unknown   Nsaids Nausea And Vomiting    Other reaction(s): Unknown   Cefuroxime     Other reaction(s): Unknown   Hydrocodone     Other reaction(s): Unknown   Oxycodone-Acetaminophen Other (See Comments)    Hallucination Other reaction(s): Unknown    Toradol [Ketorolac Tromethamine] Nausea And Vomiting   Tramadol Nausea And Vomiting and Other (See Comments)    Upset stomach Other reaction(s): Unknown    Family History:  Family History  Problem Relation Age of Onset   Hypothyroidism Mother    Hyperlipidemia Other    Kidney failure Father    Dementia Father    Gout Father    Arthritis Father    Prostate cancer Father      Current Outpatient Medications:    allopurinol (ZYLOPRIM) 100 MG tablet, TAKE 2 TABLETS BY MOUTH EVERY DAY, Disp: 180 tablet, Rfl: 1   amLODipine (NORVASC) 5 MG tablet, TAKE 1 TABLET (5 MG TOTAL) BY MOUTH DAILY., Disp: 30 tablet, Rfl: 5   atorvastatin (LIPITOR) 10 MG tablet, TAKE 1 TABLET BY MOUTH EVERYDAY AT BEDTIME, Disp: 30 tablet, Rfl: 5   diazepam (VALIUM) 5 MG tablet, Take 1 by mouth 1 hour  pre-procedure with very light food. May bring 2nd tablet to appointment., Disp: 2 tablet, Rfl: 0   DULoxetine (CYMBALTA) 30 MG capsule, TAKE 1 CAPSULE BY MOUTH EVERY DAY (Patient taking differently: 30 mg at bedtime.), Disp: 90 capsule, Rfl: 1   eszopiclone (LUNESTA) 2 MG TABS tablet, TAKE 1 TABLET BY MOUTH AT NIGHT IMMEDIATELY BEFORE BEDTIME, Disp: 30 tablet, Rfl: 1   fluticasone (FLONASE) 50 MCG/ACT nasal  spray, Place 1 spray into both nostrils daily as needed for allergies., Disp: , Rfl:    hydroxychloroquine (PLAQUENIL) 200 MG tablet, Take 1 tablet (200 mg total) by mouth daily., Disp: 90 tablet, Rfl: 0   loratadine (CLARITIN) 10 MG tablet, Take 10 mg by mouth daily as needed for allergies., Disp: , Rfl:    losartan (COZAAR) 50 MG tablet, TAKE 1 TABLET BY MOUTH EVERY DAY, Disp: 30 tablet, Rfl: 5   predniSONE (DELTASONE) 10 MG tablet, Take 10 mg by mouth as needed., Disp: , Rfl:    pregabalin (LYRICA) 50 MG capsule, Take 1 capsule (50 mg total) by mouth 3 (three) times daily., Disp: 90 capsule, Rfl: 3   tiZANidine (ZANAFLEX) 4 MG tablet, Take 1 tablet (4 mg total) by mouth every 6 (six) hours as needed for muscle  spasms., Disp: 30 tablet, Rfl: 0   topiramate (TOPAMAX) 25 MG tablet, Take 1 tablet (25 mg total) by mouth at bedtime., Disp: 30 tablet, Rfl: 3  Review of Systems:  Constitutional: Denies fever, chills, diaphoresis, appetite change and fatigue.  HEENT: Denies photophobia, eye pain, redness, hearing loss, ear pain, congestion, sore throat, rhinorrhea, sneezing, mouth sores, trouble swallowing, neck pain, neck stiffness and tinnitus.   Respiratory: Denies SOB, DOE, cough, chest tightness,  and wheezing.   Cardiovascular: Denies chest pain, palpitations and leg swelling.  Gastrointestinal: Denies nausea, vomiting, abdominal pain, diarrhea, constipation, blood in stool and abdominal distention.  Genitourinary: Denies dysuria, urgency, frequency, hematuria, flank pain and difficulty urinating.  Endocrine: Denies: hot or cold intolerance, sweats, changes in hair or nails, polyuria, polydipsia. Musculoskeletal: Positive for myalgias, back pain, joint swelling, arthralgias and gait problem.  Skin: Denies pallor, rash and wound.  Neurological: Denies dizziness, seizures, syncope, weakness, light-headedness, numbness and headaches.  Hematological: Denies adenopathy. Easy bruising, personal or family bleeding history  Psychiatric/Behavioral: Denies suicidal ideation, mood changes, confusion, nervousness, sleep disturbance and agitation    Physical Exam: Vitals:   03/05/21 0753  BP: 120/80      Constitutional: NAD, calm, comfortable Eyes: PERRL, lids and conjunctivae normal, wears corrective lenses ENMT: Mucous membranes are moist.   Psychiatric: Normal judgment and insight. Alert and oriented x 3. Normal mood.    Impression and Plan:  Cervical cancer screening -Pap smear performed in office today.  Time spent: 20 minutes reviewing chart, interviewing and examining patient, performing Pap smear.     Lelon Frohlich, MD [facility]

## 2021-03-06 ENCOUNTER — Other Ambulatory Visit (HOSPITAL_COMMUNITY)
Admission: RE | Admit: 2021-03-06 | Discharge: 2021-03-06 | Disposition: A | Discharge status: Home / Self Care | Payer: BC Managed Care – PPO | Source: Ambulatory Visit | Attending: Orthopaedic Surgery | Admitting: Orthopaedic Surgery

## 2021-03-06 ENCOUNTER — Other Ambulatory Visit: Payer: Self-pay | Admitting: Internal Medicine

## 2021-03-06 DIAGNOSIS — Z20822 Contact with and (suspected) exposure to covid-19: Secondary | ICD-10-CM | POA: Insufficient documentation

## 2021-03-06 DIAGNOSIS — Z01818 Encounter for other preprocedural examination: Secondary | ICD-10-CM

## 2021-03-06 DIAGNOSIS — Z01812 Encounter for preprocedural laboratory examination: Secondary | ICD-10-CM | POA: Insufficient documentation

## 2021-03-06 LAB — CYTOLOGY - PAP
Adequacy: ABSENT
Comment: NEGATIVE
Diagnosis: NEGATIVE
High risk HPV: NEGATIVE

## 2021-03-06 LAB — SARS CORONAVIRUS 2 (TAT 6-24 HRS): SARS Coronavirus 2: NEGATIVE

## 2021-03-06 NOTE — H&P (Signed)
Amy Hooper is an 70 y.o. female.   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  States that symptoms unchanged from previous visit.  She is wanting to proceed with RIGHT L4-5 TRANSFORAMINAL LUMBAR INTERBODY FUSION, PEDICLE INSTRUMENTATION, CAGE, LATERAL FUSION, RIGHT L5-S1 MICRODISCECTOMY as scheduled.  Today history physical performed.  Review of systems negative.  We have received nephrology clearance from Dr. Candiss Norse.  Patient has a history of sleep apnea and uses CPAP.  Past Medical History:  Diagnosis Date   Chronic kidney disease    Depression    Fibromyalgia    Gout    HTN (hypertension)    Insomnia    Morbid obesity (HCC)    Osteoarthritis    Palpitation    Sleep apnea     Past Surgical History:  Procedure Laterality Date   JOINT REPLACEMENT     KNEE ARTHROSCOPY     right rotator cuff     ROTATOR CUFF REPAIR Left    TOTAL KNEE ARTHROPLASTY Left 09/08/2020   Procedure: LEFT TOTAL KNEE ARTHROPLASTY;  Surgeon: Leandrew Koyanagi, MD;  Location: [facility];  Service: Orthopedics;  Laterality: Left;   TUBAL LIGATION      Family History  Problem Relation Age of Onset   Hypothyroidism Mother    Hyperlipidemia Other    Kidney failure Father    Dementia Father    Gout Father    Arthritis Father    Prostate cancer Father    Social History:  reports that she has never smoked. She has never used smokeless tobacco. She reports that she does not drink alcohol and does not use drugs.  Allergies:  Allergies  Allergen Reactions   Cefuroxime Axetil Rash and Hives   Clarithromycin Rash and Hives    Other reaction(s): Unknown   Nsaids Nausea And Vomiting    Other reaction(s): Unknown   Cefuroxime     Other reaction(s): Unknown   Hydrocodone     Other reaction(s): Unknown   Oxycodone-Acetaminophen Other (See Comments)    Hallucination Other reaction(s):  Unknown   Toradol [Ketorolac Tromethamine] Nausea And Vomiting   Tramadol Nausea And Vomiting and Other (See Comments)    Upset stomach Other reaction(s): Unknown    No medications prior to admission.    Results for orders placed or performed in visit on 03/05/21 (from the past 48 hour(s))  PAP [[facility]]     Status: None   Collection Time: 03/05/21  8:19 AM  Result Value Ref Range   High risk HPV Negative    Adequacy      Satisfactory for evaluation; transformation zone component ABSENT.   Diagnosis      - Negative for intraepithelial lesion or malignancy (NILM)   Microorganisms Shift in flora suggestive of bacterial vaginosis    Comment Normal Reference Range HPV - Negative    No results found.  Review of Systems  Constitutional:  Positive for activity change.  HENT: Negative.    Respiratory:  Positive for apnea.   Cardiovascular: Negative.   Gastrointestinal: Negative.   Musculoskeletal:  Positive for back pain and gait problem.  Neurological:  Positive for numbness.  Psychiatric/Behavioral: Negative.     There were no vitals taken for this visit. Physical Exam HENT:     Head: Normocephalic and atraumatic.     Nose: Nose normal.  Eyes:     Extraocular Movements: Extraocular movements  intact.  Cardiovascular:     Rate and Rhythm: Regular rhythm.     Heart sounds: Normal heart sounds.  Pulmonary:     Effort: Pulmonary effort is normal. No respiratory distress.     Breath sounds: Normal breath sounds.  Musculoskeletal:        General: Tenderness present.  Neurological:     Mental Status: She is alert and oriented to person, place, and time.  Psychiatric:        Mood and Affect: Mood normal.     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   Surgical procedure discussed along with potential rehab/recovery time.  All questions answered.  I did asked patient to bring her CPAP mask and hose to the hospital to use  during admission.   Benjiman Core, PA-C 03/06/2021, 3:37 PM

## 2021-03-07 ENCOUNTER — Other Ambulatory Visit: Payer: Self-pay | Admitting: Physical Medicine and Rehabilitation

## 2021-03-08 NOTE — Anesthesia Preprocedure Evaluation (Addendum)
Anesthesia Evaluation  Patient identified by MRN, date of birth, ID band Patient awake    Reviewed: Allergy & Precautions, NPO status , Patient's Chart, lab work & pertinent test results  Airway Mallampati: II       Dental no notable dental hx.    Pulmonary sleep apnea and Continuous Positive Airway Pressure Ventilation ,    Pulmonary exam normal        Cardiovascular hypertension, Pt. on medications Normal cardiovascular exam  EKG 09/02/20 NSR, normal   Neuro/Psych PSYCHIATRIC DISORDERS Depression  Neuromuscular disease    GI/Hepatic negative GI ROS, Neg liver ROS,   Endo/Other  Morbid obesityHyperlipidemia  Renal/GU Renal InsufficiencyRenal disease  negative genitourinary   Musculoskeletal  (+) Arthritis , Osteoarthritis,  Fibromyalgia -OA both knees   Abdominal (+) + obese,   Peds  Hematology negative hematology ROS (+)   Anesthesia Other Findings   Reproductive/Obstetrics                            Anesthesia Physical  Anesthesia Plan  ASA: 3  Anesthesia Plan: General   Post-op Pain Management:  Regional for Post-op pain   Induction: Intravenous  PONV Risk Score and Plan: 3 and 4 or greater and Treatment may vary due to age or medical condition, Propofol infusion, Ondansetron and Midazolam  Airway Management Planned: Oral ETT  Additional Equipment: None  Intra-op Plan:   Post-operative Plan: Extubation in OR  Informed Consent: I have reviewed the patients History and Physical, chart, labs and discussed the procedure including the risks, benefits and alternatives for the proposed anesthesia with the patient or authorized representative who has indicated his/her understanding and acceptance.     Dental advisory given  Plan Discussed with: CRNA  Anesthesia Plan Comments:        Anesthesia Quick Evaluation

## 2021-03-09 ENCOUNTER — Ambulatory Visit (HOSPITAL_COMMUNITY): Payer: BC Managed Care – PPO

## 2021-03-09 ENCOUNTER — Observation Stay (HOSPITAL_COMMUNITY)
Admission: RE | Admit: 2021-03-09 | Discharge: 2021-03-11 | Disposition: A | Discharge status: Home / Self Care | Payer: BC Managed Care – PPO | Attending: Orthopaedic Surgery | Admitting: Orthopaedic Surgery

## 2021-03-09 ENCOUNTER — Other Ambulatory Visit: Payer: Self-pay

## 2021-03-09 ENCOUNTER — Ambulatory Visit (HOSPITAL_COMMUNITY): Payer: BC Managed Care – PPO | Admitting: Anesthesiology

## 2021-03-09 ENCOUNTER — Encounter (HOSPITAL_COMMUNITY): Payer: Self-pay | Admitting: Orthopaedic Surgery

## 2021-03-09 ENCOUNTER — Encounter (HOSPITAL_COMMUNITY)
Admission: RE | Disposition: A | Discharge status: Home / Self Care | Payer: Self-pay | Source: Home / Self Care | Attending: Orthopaedic Surgery

## 2021-03-09 DIAGNOSIS — Z01818 Encounter for other preprocedural examination: Secondary | ICD-10-CM

## 2021-03-09 DIAGNOSIS — Z96652 Presence of left artificial knee joint: Secondary | ICD-10-CM | POA: Diagnosis not present

## 2021-03-09 DIAGNOSIS — I129 Hypertensive chronic kidney disease with stage 1 through stage 4 chronic kidney disease, or unspecified chronic kidney disease: Secondary | ICD-10-CM | POA: Diagnosis not present

## 2021-03-09 DIAGNOSIS — M5116 Intervertebral disc disorders with radiculopathy, lumbar region: Principal | ICD-10-CM | POA: Insufficient documentation

## 2021-03-09 DIAGNOSIS — M5117 Intervertebral disc disorders with radiculopathy, lumbosacral region: Secondary | ICD-10-CM | POA: Diagnosis not present

## 2021-03-09 DIAGNOSIS — M48062 Spinal stenosis, lumbar region with neurogenic claudication: Secondary | ICD-10-CM | POA: Diagnosis not present

## 2021-03-09 DIAGNOSIS — Z419 Encounter for procedure for purposes other than remedying health state, unspecified: Secondary | ICD-10-CM

## 2021-03-09 DIAGNOSIS — N189 Chronic kidney disease, unspecified: Secondary | ICD-10-CM | POA: Insufficient documentation

## 2021-03-09 DIAGNOSIS — M48061 Spinal stenosis, lumbar region without neurogenic claudication: Secondary | ICD-10-CM | POA: Diagnosis present

## 2021-03-09 DIAGNOSIS — M5126 Other intervertebral disc displacement, lumbar region: Secondary | ICD-10-CM | POA: Diagnosis not present

## 2021-03-09 LAB — COMPREHENSIVE METABOLIC PANEL
ALT: 13 U/L (ref 0–44)
AST: 16 U/L (ref 15–41)
Albumin: 3.5 g/dL (ref 3.5–5.0)
Alkaline Phosphatase: 85 U/L (ref 38–126)
Anion gap: 7 (ref 5–15)
BUN: 12 mg/dL (ref 8–23)
CO2: 26 mmol/L (ref 22–32)
Calcium: 9.4 mg/dL (ref 8.9–10.3)
Chloride: 106 mmol/L (ref 98–111)
Creatinine, Ser: 1.19 mg/dL — ABNORMAL HIGH (ref 0.44–1.00)
GFR, Estimated: 50 mL/min — ABNORMAL LOW (ref >60)
Glucose, Bld: 96 mg/dL (ref 70–99)
Potassium: 3.6 mmol/L (ref 3.5–5.1)
Sodium: 139 mmol/L (ref 135–145)
Total Bilirubin: 0.8 mg/dL (ref 0.3–1.2)
Total Protein: 6.5 g/dL (ref 6.5–8.1)

## 2021-03-09 IMAGING — RF DG LUMBAR SPINE 2-3V
1 series · 2 of 2 positions shown · non-contrast
Comparison: MRI [DATE].

CLINICAL DATA: L5-S1 discectomy.

EXAM:
LUMBAR SPINE - 2-3 VIEW; DG C-ARM 1-60 MIN-NO REPORT
Radiation exposure index: 33.85 mGy.

[Series 1: run · 2 of 2 slices shown]
[im 1/2]
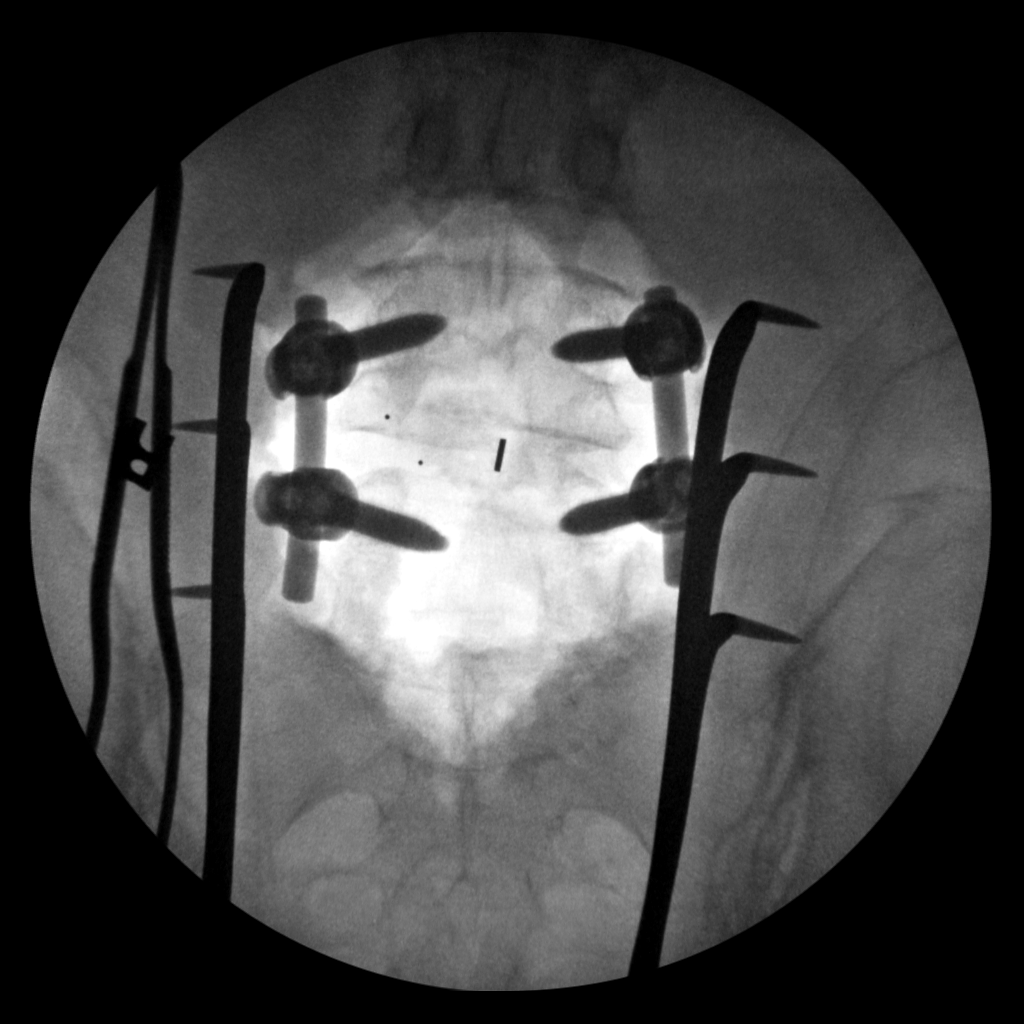
[im 2/2]
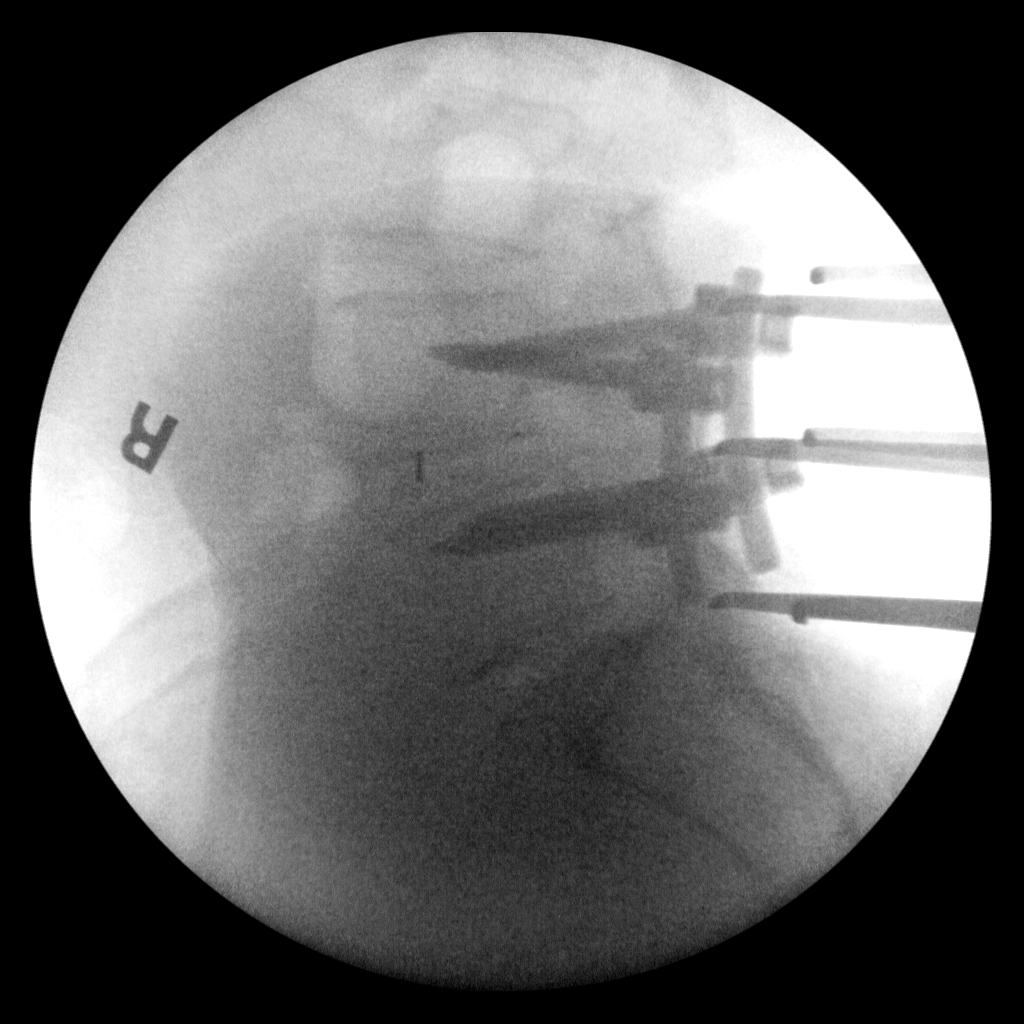

[2 of 2 positions shown; findings below may reference images not displayed]

FINDINGS: Two intraoperative fluoroscopic images were obtained of the lower
lumbar spine. These demonstrate the patient be status post surgical
posterior fusion of L4-5 with bilateral intrapedicular screw
placement and interbody fusion.
IMPRESSION: Fluoroscopic guidance provided during lower lumbar surgery.

## 2021-03-09 IMAGING — CR DG OR LOCAL ABDOMEN
1 series · 1 of 1 positions shown · non-contrast
Comparison: None.

CLINICAL DATA: 70-year-old female with a history of possible
missing sponge

EXAM:
OR LOCAL ABDOMEN

[AP]
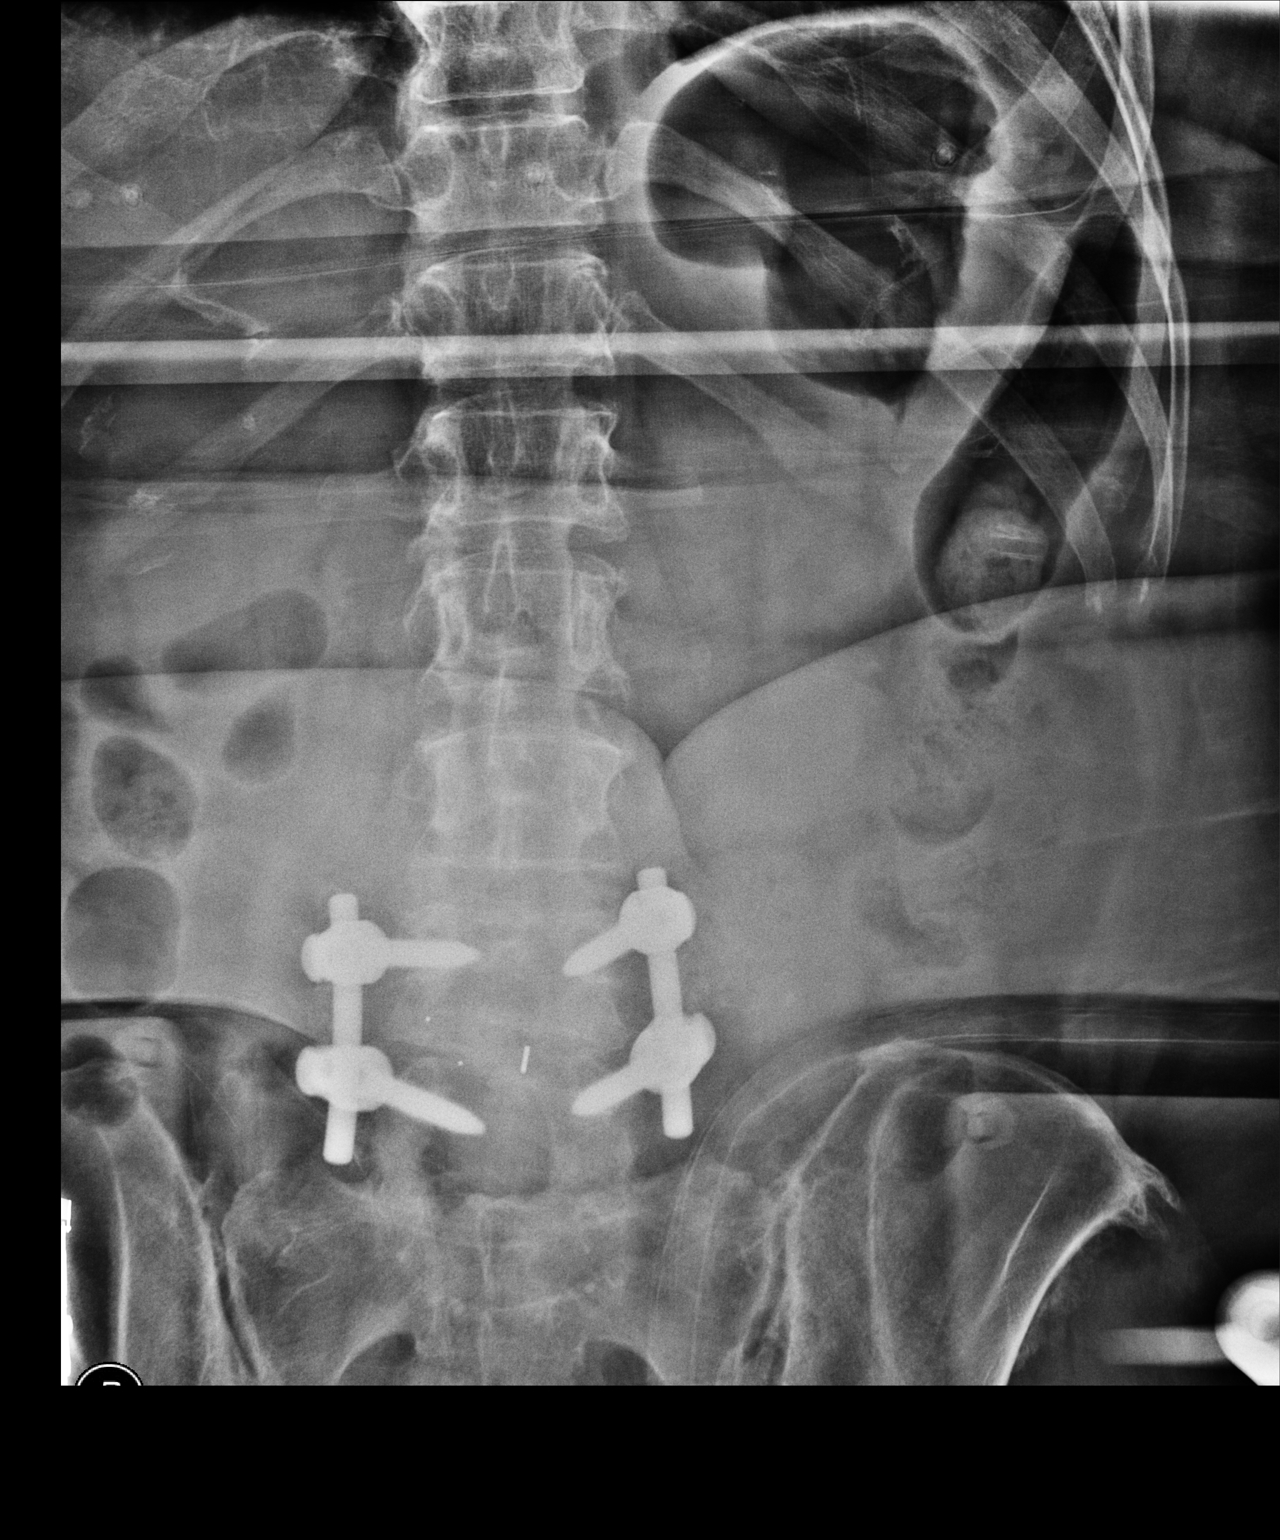

[1 of 1 positions shown; findings below may reference images not displayed]

FINDINGS: Limited plain film of the abdomen.

Surgical changes of L4-L5 discectomy infusion.

No unexpected radiopaque foreign body of the lower abdomen/pelvis.
IMPRESSION: No unexpected radiopaque foreign body. Results called to the
operating room staff at the time of interpretation on [DATE] at

## 2021-03-09 SURGERY — POSTERIOR LUMBAR FUSION 1 LEVEL
Anesthesia: General | Site: Back

## 2021-03-09 MED ORDER — PROMETHAZINE HCL 25 MG/ML IJ SOLN: 6.2500 mg | INTRAMUSCULAR | Status: DC | PRN

## 2021-03-09 MED ORDER — BUPIVACAINE HCL (PF) 0.5 % IJ SOLN
INTRAMUSCULAR | Status: AC
  Filled 2021-03-09: qty 30

## 2021-03-09 MED ORDER — MEPERIDINE HCL 25 MG/ML IJ SOLN: 6.2500 mg | INTRAMUSCULAR | Status: DC | PRN

## 2021-03-09 MED ORDER — PHENYLEPHRINE 40 MCG/ML (10ML) SYRINGE FOR IV PUSH (FOR BLOOD PRESSURE SUPPORT)
PREFILLED_SYRINGE | INTRAVENOUS | Status: DC | PRN
  Administered 2021-03-09: 40 ug via INTRAVENOUS
  Administered 2021-03-09 (×2): 80 ug via INTRAVENOUS
  Administered 2021-03-09: 40 ug via INTRAVENOUS
  Administered 2021-03-09 (×3): 80 ug via INTRAVENOUS

## 2021-03-09 MED ORDER — PROPOFOL 10 MG/ML IV BOLUS
INTRAVENOUS | Status: DC | PRN
  Administered 2021-03-09: 200 mg via INTRAVENOUS

## 2021-03-09 MED ORDER — ACETAMINOPHEN 10 MG/ML IV SOLN
1000.0000 mg | Freq: Once | INTRAVENOUS | Status: DC | PRN
  Administered 2021-03-09: 13:00:00 1000 mg via INTRAVENOUS

## 2021-03-09 MED ORDER — SODIUM CHLORIDE 0.9 % IV SOLN
250.0000 mL | INTRAVENOUS | Status: DC
  Administered 2021-03-09: 16:00:00 250 mL via INTRAVENOUS

## 2021-03-09 MED ORDER — 0.9 % SODIUM CHLORIDE (POUR BTL) OPTIME
TOPICAL | Status: DC | PRN
  Administered 2021-03-09: 09:00:00 1000 mL

## 2021-03-09 MED ORDER — PHENYLEPHRINE 40 MCG/ML (10ML) SYRINGE FOR IV PUSH (FOR BLOOD PRESSURE SUPPORT)
PREFILLED_SYRINGE | INTRAVENOUS | Status: AC
  Administered 2021-03-09: 14:00:00 80 ug
  Filled 2021-03-09: qty 10

## 2021-03-09 MED ORDER — DEXAMETHASONE SODIUM PHOSPHATE 10 MG/ML IJ SOLN
INTRAMUSCULAR | Status: DC | PRN
  Administered 2021-03-09: 10 mg via INTRAVENOUS

## 2021-03-09 MED ORDER — HYDROMORPHONE HCL 1 MG/ML IJ SOLN
0.5000 mg | INTRAMUSCULAR | Status: DC | PRN
  Administered 2021-03-09 – 2021-03-11 (×6): 0.5 mg via INTRAVENOUS
  Filled 2021-03-09 (×6): qty 0.5

## 2021-03-09 MED ORDER — FENTANYL CITRATE (PF) 250 MCG/5ML IJ SOLN
INTRAMUSCULAR | Status: DC | PRN
  Administered 2021-03-09: 50 ug via INTRAVENOUS
  Administered 2021-03-09: 150 ug via INTRAVENOUS
  Administered 2021-03-09: 50 ug via INTRAVENOUS

## 2021-03-09 MED ORDER — METHOCARBAMOL 1000 MG/10ML IJ SOLN
500.0000 mg | Freq: Four times a day (QID) | INTRAVENOUS | Status: DC | PRN
  Filled 2021-03-09: qty 5

## 2021-03-09 MED ORDER — EPHEDRINE 5 MG/ML INJ
INTRAVENOUS | Status: AC
  Administered 2021-03-09: 14:00:00 5 mg
  Filled 2021-03-09: qty 5

## 2021-03-09 MED ORDER — HYDROCODONE-ACETAMINOPHEN 7.5-325 MG PO TABS
1.0000 | ORAL_TABLET | ORAL | Status: DC | PRN
  Administered 2021-03-09 – 2021-03-11 (×6): 1 via ORAL
  Filled 2021-03-09 (×8): qty 1

## 2021-03-09 MED ORDER — FLUTICASONE PROPIONATE 50 MCG/ACT NA SUSP: 1.0000 | Freq: Every day | NASAL | Status: DC | PRN

## 2021-03-09 MED ORDER — ATORVASTATIN CALCIUM 10 MG PO TABS
10.0000 mg | ORAL_TABLET | Freq: Every day | ORAL | Status: DC
  Administered 2021-03-10: 22:00:00 10 mg via ORAL
  Filled 2021-03-09 (×2): qty 1

## 2021-03-09 MED ORDER — DEXMEDETOMIDINE (PRECEDEX) IN NS 20 MCG/5ML (4 MCG/ML) IV SYRINGE
PREFILLED_SYRINGE | INTRAVENOUS | Status: DC | PRN
  Administered 2021-03-09: 4 ug via INTRAVENOUS
  Administered 2021-03-09 (×2): 8 ug via INTRAVENOUS

## 2021-03-09 MED ORDER — ALBUMIN HUMAN 5 % IV SOLN
INTRAVENOUS | Status: AC
  Filled 2021-03-09: qty 250

## 2021-03-09 MED ORDER — PROPOFOL 10 MG/ML IV BOLUS
INTRAVENOUS | Status: AC
  Filled 2021-03-09: qty 20

## 2021-03-09 MED ORDER — PHENYLEPHRINE HCL-NACL 20-0.9 MG/250ML-% IV SOLN
INTRAVENOUS | Status: DC | PRN
  Administered 2021-03-09: 100 ug/min via INTRAVENOUS

## 2021-03-09 MED ORDER — SUGAMMADEX SODIUM 200 MG/2ML IV SOLN
INTRAVENOUS | Status: DC | PRN
  Administered 2021-03-09: 200 mg via INTRAVENOUS
  Administered 2021-03-09: 100 mg via INTRAVENOUS

## 2021-03-09 MED ORDER — SODIUM CHLORIDE 0.9% FLUSH: 3.0000 mL | INTRAVENOUS | Status: DC | PRN

## 2021-03-09 MED ORDER — VANCOMYCIN HCL IN DEXTROSE 1-5 GM/200ML-% IV SOLN
1000.0000 mg | INTRAVENOUS | Status: AC
  Administered 2021-03-09: 07:00:00 1000 mg via INTRAVENOUS
  Filled 2021-03-09: qty 200

## 2021-03-09 MED ORDER — HYDROMORPHONE HCL 1 MG/ML IJ SOLN
0.2500 mg | INTRAMUSCULAR | Status: DC | PRN
  Administered 2021-03-09 (×3): 0.25 mg via INTRAVENOUS

## 2021-03-09 MED ORDER — ALLOPURINOL 100 MG PO TABS
200.0000 mg | ORAL_TABLET | Freq: Every day | ORAL | Status: DC
  Administered 2021-03-10 – 2021-03-11 (×2): 200 mg via ORAL
  Filled 2021-03-09 (×2): qty 2

## 2021-03-09 MED ORDER — ALBUMIN HUMAN 5 % IV SOLN
12.5000 g | Freq: Once | INTRAVENOUS | Status: AC
  Administered 2021-03-09: 14:00:00 12.5 g via INTRAVENOUS

## 2021-03-09 MED ORDER — AMLODIPINE BESYLATE 5 MG PO TABS
5.0000 mg | ORAL_TABLET | Freq: Every day | ORAL | Status: DC
  Administered 2021-03-10: 09:00:00 5 mg via ORAL
  Filled 2021-03-09: qty 1

## 2021-03-09 MED ORDER — BUPIVACAINE HCL (PF) 0.5 % IJ SOLN
INTRAMUSCULAR | Status: DC | PRN
  Administered 2021-03-09: 10 mL

## 2021-03-09 MED ORDER — CHLORHEXIDINE GLUCONATE 0.12 % MT SOLN
15.0000 mL | Freq: Once | OROMUCOSAL | Status: AC
  Administered 2021-03-09: 07:00:00 15 mL via OROMUCOSAL
  Filled 2021-03-09: qty 15

## 2021-03-09 MED ORDER — FENTANYL CITRATE (PF) 250 MCG/5ML IJ SOLN
INTRAMUSCULAR | Status: AC
  Filled 2021-03-09: qty 5

## 2021-03-09 MED ORDER — ONDANSETRON HCL 4 MG/2ML IJ SOLN
4.0000 mg | Freq: Four times a day (QID) | INTRAMUSCULAR | Status: DC | PRN
  Administered 2021-03-09: 17:00:00 4 mg via INTRAVENOUS
  Filled 2021-03-09: qty 2

## 2021-03-09 MED ORDER — PHENYLEPHRINE 40 MCG/ML (10ML) SYRINGE FOR IV PUSH (FOR BLOOD PRESSURE SUPPORT)
PREFILLED_SYRINGE | INTRAVENOUS | Status: AC
  Filled 2021-03-09: qty 10

## 2021-03-09 MED ORDER — HYDROMORPHONE HCL 1 MG/ML IJ SOLN
INTRAMUSCULAR | Status: AC
  Administered 2021-03-09: 14:00:00 0.25 mg via INTRAVENOUS
  Filled 2021-03-09: qty 1

## 2021-03-09 MED ORDER — DULOXETINE HCL 30 MG PO CPEP
30.0000 mg | ORAL_CAPSULE | Freq: Every day | ORAL | Status: DC
  Administered 2021-03-10: 22:00:00 30 mg via ORAL
  Filled 2021-03-09 (×2): qty 1

## 2021-03-09 MED ORDER — ONDANSETRON HCL 4 MG PO TABS: 4.0000 mg | ORAL_TABLET | Freq: Four times a day (QID) | ORAL | Status: DC | PRN

## 2021-03-09 MED ORDER — MENTHOL 3 MG MT LOZG
1.0000 | LOZENGE | OROMUCOSAL | Status: DC | PRN
  Administered 2021-03-09: 17:00:00 3 mg via ORAL
  Filled 2021-03-09: qty 9

## 2021-03-09 MED ORDER — POLYETHYLENE GLYCOL 3350 17 G PO PACK
17.0000 g | PACK | Freq: Two times a day (BID) | ORAL | Status: DC
  Administered 2021-03-10 – 2021-03-11 (×3): 17 g via ORAL
  Filled 2021-03-09 (×4): qty 1

## 2021-03-09 MED ORDER — METHOCARBAMOL 500 MG PO TABS
500.0000 mg | ORAL_TABLET | Freq: Four times a day (QID) | ORAL | Status: DC | PRN
  Filled 2021-03-09: qty 1

## 2021-03-09 MED ORDER — TOPIRAMATE 25 MG PO TABS
25.0000 mg | ORAL_TABLET | Freq: Every day | ORAL | Status: DC
  Administered 2021-03-10: 22:00:00 25 mg via ORAL
  Filled 2021-03-09 (×2): qty 1

## 2021-03-09 MED ORDER — ROCURONIUM BROMIDE 10 MG/ML (PF) SYRINGE
PREFILLED_SYRINGE | INTRAVENOUS | Status: DC | PRN
  Administered 2021-03-09: 80 mg via INTRAVENOUS
  Administered 2021-03-09 (×2): 20 mg via INTRAVENOUS

## 2021-03-09 MED ORDER — LACTATED RINGERS IV SOLN: INTRAVENOUS | Status: DC

## 2021-03-09 MED ORDER — LORATADINE 10 MG PO TABS: 10.0000 mg | ORAL_TABLET | Freq: Every day | ORAL | Status: DC | PRN

## 2021-03-09 MED ORDER — MIDAZOLAM HCL 2 MG/2ML IJ SOLN
INTRAMUSCULAR | Status: AC
  Filled 2021-03-09: qty 2

## 2021-03-09 MED ORDER — ORAL CARE MOUTH RINSE: 15.0000 mL | Freq: Once | OROMUCOSAL | Status: AC

## 2021-03-09 MED ORDER — PREGABALIN 50 MG PO CAPS
50.0000 mg | ORAL_CAPSULE | Freq: Three times a day (TID) | ORAL | Status: DC
  Administered 2021-03-09 – 2021-03-11 (×5): 50 mg via ORAL
  Filled 2021-03-09 (×6): qty 1

## 2021-03-09 MED ORDER — DEXAMETHASONE SODIUM PHOSPHATE 10 MG/ML IJ SOLN
10.0000 mg | Freq: Once | INTRAMUSCULAR | Status: AC
  Administered 2021-03-09: 22:00:00 10 mg via INTRAVENOUS
  Filled 2021-03-09: qty 1

## 2021-03-09 MED ORDER — LOSARTAN POTASSIUM 50 MG PO TABS
50.0000 mg | ORAL_TABLET | Freq: Every day | ORAL | Status: DC
  Administered 2021-03-10: 09:00:00 50 mg via ORAL
  Filled 2021-03-09: qty 1

## 2021-03-09 MED ORDER — SURGIFLO WITH THROMBIN (HEMOSTATIC MATRIX KIT) OPTIME
TOPICAL | Status: DC | PRN
  Administered 2021-03-09: 1

## 2021-03-09 MED ORDER — PHENOL 1.4 % MT LIQD: 1.0000 | OROMUCOSAL | Status: DC | PRN

## 2021-03-09 MED ORDER — LIDOCAINE 2% (20 MG/ML) 5 ML SYRINGE
INTRAMUSCULAR | Status: DC | PRN
  Administered 2021-03-09: 100 mg via INTRAVENOUS

## 2021-03-09 MED ORDER — TIZANIDINE HCL 4 MG PO TABS
4.0000 mg | ORAL_TABLET | Freq: Four times a day (QID) | ORAL | Status: DC | PRN
  Administered 2021-03-09 – 2021-03-10 (×4): 4 mg via ORAL
  Filled 2021-03-09 (×4): qty 1

## 2021-03-09 MED ORDER — ONDANSETRON HCL 4 MG/2ML IJ SOLN
INTRAMUSCULAR | Status: DC | PRN
  Administered 2021-03-09: 4 mg via INTRAVENOUS

## 2021-03-09 MED ORDER — THROMBIN (RECOMBINANT) 20000 UNITS EX SOLR
CUTANEOUS | Status: AC
  Filled 2021-03-09: qty 20000

## 2021-03-09 MED ORDER — ACETAMINOPHEN 10 MG/ML IV SOLN
INTRAVENOUS | Status: AC
  Filled 2021-03-09: qty 100

## 2021-03-09 MED ORDER — SODIUM CHLORIDE 0.9% FLUSH
3.0000 mL | Freq: Two times a day (BID) | INTRAVENOUS | Status: DC
  Administered 2021-03-10 – 2021-03-11 (×3): 3 mL via INTRAVENOUS

## 2021-03-09 MED ORDER — OXYCODONE HCL 5 MG PO TABS: 5.0000 mg | ORAL_TABLET | ORAL | Status: DC | PRN

## 2021-03-09 MED ORDER — MIDAZOLAM HCL 2 MG/2ML IJ SOLN
INTRAMUSCULAR | Status: DC | PRN
  Administered 2021-03-09: 2 mg via INTRAVENOUS

## 2021-03-09 SURGICAL SUPPLY — 58 items
BAG COUNTER SPONGE SURGICOUNT (BAG) ×2 IMPLANT
BAG SURGICOUNT SPONGE COUNTING (BAG) ×1
BUR ROUND FLUTED 4 SOFT TCH (BURR) ×2 IMPLANT
BUR ROUND FLUTED 4MM SOFT TCH (BURR) ×1
CABLE BIPOLOR RESECTION CORD (MISCELLANEOUS) ×3 IMPLANT
CAP LOCKING THREADED (Cap) ×8 IMPLANT
COVER BACK TABLE 80X110 HD (DRAPES) ×3 IMPLANT
COVER SURGICAL LIGHT HANDLE (MISCELLANEOUS) ×3 IMPLANT
DRAPE C-ARM 42X72 X-RAY (DRAPES) ×3 IMPLANT
DRAPE LAPAROTOMY T 102X78X121 (DRAPES) ×3 IMPLANT
DRAPE MICROSCOPE LEICA (MISCELLANEOUS) ×3 IMPLANT
DRAPE SURG 17X23 STRL (DRAPES) ×9 IMPLANT
DRSG MEPILEX BORDER 4X8 (GAUZE/BANDAGES/DRESSINGS) ×2 IMPLANT
DURAPREP 26ML APPLICATOR (WOUND CARE) ×3 IMPLANT
DURASEAL SPINE SEALANT 5 POLY (MISCELLANEOUS) ×2 IMPLANT
ELECT BLADE 4.0 EZ CLEAN MEGAD (MISCELLANEOUS) ×3
ELECT CAUTERY BLADE 6.4 (BLADE) ×3 IMPLANT
ELECT REM PT RETURN 9FT ADLT (ELECTROSURGICAL) ×3
ELECTRODE BLDE 4.0 EZ CLN MEGD (MISCELLANEOUS) ×1 IMPLANT
ELECTRODE REM PT RTRN 9FT ADLT (ELECTROSURGICAL) ×1 IMPLANT
GLOVE SRG 8 PF TXTR STRL LF DI (GLOVE) ×2 IMPLANT
GLOVE SURG ORTHO LTX SZ7.5 (GLOVE) ×6 IMPLANT
GLOVE SURG UNDER POLY LF SZ8 (GLOVE) ×4
GOWN STRL REUS W/ TWL LRG LVL3 (GOWN DISPOSABLE) ×1 IMPLANT
GOWN STRL REUS W/ TWL XL LVL3 (GOWN DISPOSABLE) ×1 IMPLANT
GOWN STRL REUS W/TWL 2XL LVL3 (GOWN DISPOSABLE) ×3 IMPLANT
GOWN STRL REUS W/TWL LRG LVL3 (GOWN DISPOSABLE) ×2
GOWN STRL REUS W/TWL XL LVL3 (GOWN DISPOSABLE) ×2
KIT BASIN OR (CUSTOM PROCEDURE TRAY) ×3 IMPLANT
KIT POSITION SURG JACKSON T1 (MISCELLANEOUS) IMPLANT
KIT TURNOVER KIT B (KITS) ×3 IMPLANT
MANIFOLD NEPTUNE II (INSTRUMENTS) ×3 IMPLANT
NS IRRIG 1000ML POUR BTL (IV SOLUTION) ×3 IMPLANT
PACK LAMINECTOMY ORTHO (CUSTOM PROCEDURE TRAY) ×3 IMPLANT
PAD ARMBOARD 7.5X6 YLW CONV (MISCELLANEOUS) ×6 IMPLANT
PATTIES SURGICAL .5 X.5 (GAUZE/BANDAGES/DRESSINGS) ×8 IMPLANT
ROD CREO 50MM (Rod) ×4 IMPLANT
SCREW POLY THRD CREO 6.5X40 (Screw) ×8 IMPLANT
SPACER TLIF SIGN 11 SM (Spacer) ×2 IMPLANT
SPONGE SURGIFOAM ABS GEL 100 (HEMOSTASIS) IMPLANT
SPONGE T-LAP 18X18 ~~LOC~~+RFID (SPONGE) ×2 IMPLANT
SPONGE T-LAP 4X18 ~~LOC~~+RFID (SPONGE) ×10 IMPLANT
SURGIFLO W/THROMBIN 8M KIT (HEMOSTASIS) ×2 IMPLANT
SUT BONE WAX W31G (SUTURE) ×3 IMPLANT
SUT NURALON 4 0 TR CR/8 (SUTURE) ×2 IMPLANT
SUT VIC AB 0 CT1 27 (SUTURE) ×4
SUT VIC AB 0 CT1 27XBRD ANBCTR (SUTURE) IMPLANT
SUT VIC AB 1 CTX 27 (SUTURE) ×6 IMPLANT
SUT VIC AB 1 CTX 36 (SUTURE) ×2
SUT VIC AB 1 CTX36XBRD ANBCTR (SUTURE) ×1 IMPLANT
SUT VIC AB 2-0 CT1 27 (SUTURE) ×2
SUT VIC AB 2-0 CT1 TAPERPNT 27 (SUTURE) ×1 IMPLANT
SUT VIC AB 3-0 X1 27 (SUTURE) IMPLANT
TAP SURG AMP CREO 5.5 (TAP) ×2 IMPLANT
TOWEL GREEN STERILE (TOWEL DISPOSABLE) ×3 IMPLANT
TOWEL GREEN STERILE FF (TOWEL DISPOSABLE) ×3 IMPLANT
TRAY FOLEY MTR SLVR 16FR STAT (SET/KITS/TRAYS/PACK) ×3 IMPLANT
YANKAUER SUCT BULB TIP NO VENT (SUCTIONS) ×3 IMPLANT

## 2021-03-09 NOTE — Anesthesia Procedure Notes (Signed)
Procedure Name: Intubation Date/Time: 03/09/2021 7:39 AM Performed by: Michele Rockers, CRNA Pre-anesthesia Checklist: Patient identified, Patient being monitored, Timeout performed, Emergency Drugs available and Suction available Patient Re-evaluated:Patient Re-evaluated prior to induction Oxygen Delivery Method: Circle system utilized Preoxygenation: Pre-oxygenation with 100% oxygen Induction Type: IV induction Ventilation: Mask ventilation without difficulty Laryngoscope Size: Miller and 2 Grade View: Grade I Tube type: Oral Tube size: 7.0 mm Number of attempts: 1 Airway Equipment and Method: Stylet Placement Confirmation: ETT inserted through vocal cords under direct vision, positive ETCO2 and breath sounds checked- equal and bilateral Secured at: 21 cm Tube secured with: Tape Dental Injury: Teeth and Oropharynx as per pre-operative assessment

## 2021-03-09 NOTE — Transfer of Care (Signed)
Immediate Anesthesia Transfer of Care Note  [name]  Procedure(s) Performed: RIGHT LUMBAR FOUR-FIVE TRANSFORAMINAL LUMBAR INTERBODY FUSION, PEDICLE INSTRUMENTATION, CAGE, LATERAL FUSION, RIGHT LUMBAR FIVE-SACRAL ONE MICRODISCECTOMY (Back)  Patient Location: PACU  Anesthesia Type:General  Level of Consciousness: awake, alert , patient cooperative and responds to stimulation  Airway & Oxygen Therapy: Patient Spontanous Breathing and Patient connected to face mask oxygen  Post-op Assessment: Report given to RN, Post -op Vital signs reviewed and stable and Patient moving all extremities X 4  Post vital signs: Reviewed and stable  Last Vitals:  Vitals Value Taken Time  BP 93/56 03/09/21 1305  Temp    Pulse    Resp 27 03/09/21 1306  SpO2    Vitals shown include unvalidated device data.  Last Pain:  Vitals:   03/09/21 0638  TempSrc:   PainSc: 8       Patients Stated Pain Goal: 0 (39/35/94 0905)  Complications: No notable events documented.

## 2021-03-09 NOTE — Progress Notes (Signed)
Orthopedic Tech Progress Note Patient Details:  Amy Hooper 04/05/1950 [id number]  PACU RN called requesting an ASPEN LUMBAR BRACE   Ortho Devices Type of Ortho Device: Lumbar corsett Ortho Device/Splint Location: BACK Ortho Device/Splint Interventions: Ordered   Post Interventions Patient Tolerated: Well Instructions Provided: Care of device  Janit Pagan 03/09/2021, 1:36 PM

## 2021-03-09 NOTE — Plan of Care (Signed)

## 2021-03-09 NOTE — Anesthesia Postprocedure Evaluation (Signed)
Anesthesia Post Note  [name]  Procedure(s) Performed: RIGHT LUMBAR FOUR-FIVE TRANSFORAMINAL LUMBAR INTERBODY FUSION, PEDICLE INSTRUMENTATION, CAGE, LATERAL FUSION, RIGHT LUMBAR FIVE-SACRAL ONE MICRODISCECTOMY and REPAIR OF DURA (Back)     Patient location during evaluation: PACU Anesthesia Type: General Level of consciousness: sedated Pain management: pain level controlled Vital Signs Assessment: post-procedure vital signs reviewed and stable Respiratory status: spontaneous breathing Cardiovascular status: stable Postop Assessment: no apparent nausea or vomiting Anesthetic complications: no   No notable events documented.  Last Vitals:  Vitals:   03/09/21 1435 03/09/21 1450  BP: (!) 111/55 102/74  Pulse: 64 60  Resp: (!) 9 13  Temp:  37 C  SpO2: 100% 97%    Last Pain:  Vitals:   03/09/21 1450  TempSrc:   PainSc: [place] Jr

## 2021-03-09 NOTE — Interval H&P Note (Signed)
History and Physical Interval Note:  03/09/2021 7:17 AM  Amy Hooper  has presented today for surgery, with the diagnosis of L4-5 stenosis, anterolisthesis, right L5-S1 herniated nucleus pulposus.  The various methods of treatment have been discussed with the patient and family. After consideration of risks, benefits and other options for treatment, the patient has consented to  Procedure(s): RIGHT L4-5 TRANSFORAMINAL LUMBAR INTERBODY FUSION, PEDICLE INSTRUMENTATION, CAGE, LATERAL FUSION, RIGHT L5-S1 MICRODISCECTOMY (N/A) as a surgical intervention.  The patient's history has been reviewed, patient examined, no change in status, stable for surgery.  I have reviewed the patient's chart and labs.  Questions were answered to the patient's satisfaction.     [name]

## 2021-03-09 NOTE — Op Note (Addendum)
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  Right L5-S1 HNP.  Procedure: Right L4-5 TLIF interbody fusion with cage local bone, bilateral intertransverse process fusion, 1 level pedicle instrumentation.  Right L5-S1 microdiscectomy.,  Primary dural repair.  Surgeon: Lorin Mercy MD  Assistant: Benjiman Core, PA-C medically necessary and present for the entire procedure  Anesthesia General oral tracheal plus Marcaine skin local.  EBL :  250 cc  Implants:Implants  Implants  SPACER TLIF SIGN 11 SM - [id number]  Inventory Item: SPACER TLIF SIGN 11 SM Serial no.:  Model/Cat no.: [id number]  Implant name: SPACER TLIF SIGN 11 SM - [id number] Laterality: N/A Area: Spine Lumbar  Manufacturer: [employer] Date of Manufacture:    Action: Implanted Number Used: 1   Device Identifier:  Device Identifier Type:     SCREW POLY THRD CREO 6.5X40 - [id number]  Inventory Item: SCREW POLY THRD CREO 6.5X40 Serial no.:  Model/Cat no.: [id number]  Implant name: Lincoln Brigham THRD CREO 6.5X40 - [id number] Laterality: N/A Area: Spine Lumbar  Manufacturer: [place] Date of Manufacture:    Action: Implanted Number Used: 4   Device Identifier:  Device Identifier Type:     CAP LOCKING THREADED - [id number]  Inventory Item: CAP LOCKING THREADED Serial no.:  Model/Cat no.: [id number]  Implant name: CAP LOCKING THREADED - [id number] Laterality: N/A Area: Spine Lumbar  Manufacturer: [place] Date of Manufacture:    Action: Implanted Number Used: 4   Device Identifier:  Device Identifier Type:     5.14mm curved rod, titanium alloy 37mm length  Inventory Item:  Serial no.:  Model/Cat no.: [id number]  Implant name: 5.8mm curved rod, titanium alloy 54mm length Laterality: N/A Area: Spine Lumbar  Manufacturer: [place] Date of Manufacture:    Action: Implanted Number Used: 2   Device Identifier:  Device Identifier Type:    70 year old female with progressive lumbar  spinal stenosis with instability on flexion-extension anterolisthesis at L4-5 with multifactorial severe spinal stenosis and right side L5-S1 HNP without central stenosis at the L5-S1 level.  She did develop multiple epidural steroid injections and her claudication symptoms have progressed to point where she could only get comfortable laying down.  Pain with standing walking she has to lean on a grocery cart has not ambulating in the community due to progression of her back pain and leg weakness.  Procedure: After standard prepping and draping patient placed on the spine frame Foley catheter has been placed careful positioning arms at 9090 yellow pads underneath her shoulders.  Due to body habitus BMI just below 40 foam pads were tucked underneath her belly to keep it off the frame.  Back with prepped after buttocks was taped down flattened the skin area to avoid some of the redundant skin rolling over and back was prepped with DuraPrep there is squared with towels Betadine Steri-Drape applied laminectomy sheets and drapes.  Timeout procedure was completed vancomycin was given due to patient's multiple allergies.  Midline incision was made subperiosteal section C arm was brought in after sterilely draped in the C arm confirming where at the 4 5 level.  Due to body habitus visualization required some coning down of the fluoroscope.  There was exposure on the lamina out to the transverse processes and we proceeded with microdiscectomy first with him he laminectomy partial at L5 on the right.  Patient had multiple epidurals in the past there was scar tissue present and with exposure of the disc using the operative microscope protecting the  dura with the Derica O dural tear occurred that was at the level of the disc extending cephalad and ventrally underneath the nerve root.  We used osteotomes and Kerrison rongeur to obtain more exposure and then proceeded with the microdiscectomy.  Passes were made with a straight  micropituitary standard straight pituitary up-biting pituitary removing disc.  Disc was bulging on the lateral recess and once this was decompressed there is no longer pressure on the nerve root.  Patties were used to protect the area where the dural tear was present and once the discectomy was complete were ready for the dural repair.  Started the repair distal extending approximately tucking the neural elements and as we progressed.  Multiple sutures 6 or 7 interrupted sutures were were placed and as we extended more proximally as it went to the axilla of the nerve root and extending more ventral suture placement became much more difficult.  After repair Valsalva did not show well fluid leak and we used some glue DuraSeal mixing it in place and over the repair.  Repeat checking again no CSF leak.  Attention was then turned to the level spinal stenosis where there was extremely thick hypertrophic ligamentum and overhanging spurs had to be removed on both sides.  Dura was decompressed around tube and C-arm was brought in with [place] 4 at the 4 5 disc base confirmed appropriate level disc was opened up and cleaned out.  Angled curettes straight curettes shaver ring curettes and pituitaries were used to clean up the disc the anterior portion of the disc was intact and then we progressed up in size from the 7 up to 11 mm trial.  We try to 1212 would not go in and 11 mm banana-shaped cage was packed with bone inserted countersunk checked AP and lateral C arm with countersunk position of 2 to 3 mm and extremely tight I tried to advance the cage further there was bone anterior to the cage and cage was solid and would not move.  We placed a pedicle screws on the right side where the cage was initially inserted first and then switched and placed on the left.  Sharp awl was used checked under AP lateral C arm the joystick checking under C arm feeling with the pedicle feeler after move the joystick tapping, feeling with the  pedicle feeler feeling inside the canal medial edge of the pedicle inferior aspect the pedicle then decorticating transverse process with the bur followed by placement of the screw and then checking AP lateral C arm.  This was repeated for all screws.  50 mm rods were used right left and the cage side right was compressed first and all screws clicked nicely and were secure.  2 mm rod was sucked out on all hands and final spot pictures were taken we oblique some look down the pedicles and were happy with position.  Some Surgi-Flo had been used in the epidural space epidural space was dry.  Estimated blood loss was 250 cc.  We rechecked the area of dural repair 1 final time no CSF leak and the DuraSeal was sitting in good position.  Copious irrigation meticulous closure tightly with #1 Vicryl 2-0 Vicryl skin staple closure postop dressing and transfer the cover him.  Postop plan patient was stable flat for least 24 hours and then gradually mobilized if she is not having problems with headache.  In the care room patient had slight numbness to her right foot she had intact EHL intact anterior  tib strong right and left and plantar flexion was strong both right and left.

## 2021-03-09 NOTE — OR Nursing (Signed)
Per radiologist Dr. Earleen Newport, there were no foreign bodies retained.

## 2021-03-10 DIAGNOSIS — M5116 Intervertebral disc disorders with radiculopathy, lumbar region: Secondary | ICD-10-CM | POA: Diagnosis not present

## 2021-03-10 MED ORDER — SODIUM CHLORIDE 0.9 % IV BOLUS
400.0000 mL | Freq: Once | INTRAVENOUS | Status: AC
  Administered 2021-03-10: 13:00:00 400 mL via INTRAVENOUS

## 2021-03-10 NOTE — Progress Notes (Signed)
Patient got up and walked with therapy.  I talked her on the phone she states her legs are working well but she got lightheaded felt a little bit woozy.  No headache.  Bladder scan showed less than 150.  Catheter removed this morning and she is drinking extra fluid now since she likely is slightly behind in fluids.bolus 410ml ordered.

## 2021-03-10 NOTE — Evaluation (Signed)
Physical Therapy Evaluation Patient Details Name: Amy Hooper MRN: [id number] DOB: Jun 22, 1950 Today's Date: 03/10/2021  History of Present Illness  70 yo s/p L4-5 TLIF; R L5-S1 microdiscectomy and repair of dura .PMH: CKD, Fibromyalgia, depression, Gout; HTN, L TKR, L TRC repair, R RTC injury.  Clinical Impression  Patient presents with decreased mobility due to generalized weakness, pain in back, decreased knowledge of precautions, and decreased activity tolerance with some orthostatic hypotension.  Patient able to walk into hallway a bit, but limited by back pain.  She was slow and needing mod cues for technique.  Feel she will continue to benefit from skilled PT in the acute setting and may not need follow up PT at d/c.         Recommendations for follow up therapy are one component of a multi-disciplinary discharge planning process, led by the attending physician.  Recommendations may be updated based on patient status, additional functional criteria and insurance authorization.  Follow Up Recommendations No PT follow up    Assistance Recommended at Discharge Intermittent Supervision/Assistance  Functional Status Assessment Patient has had a recent decline in their functional status and demonstrates the ability to make significant improvements in function in a reasonable and predictable amount of time.  Equipment Recommendations  Rolling walker (2 wheels)    Recommendations for Other Services       Precautions / Restrictions Precautions Precautions: Fall;Back Precaution Booklet Issued: Yes (comment) Precaution Comments: watch BP Required Braces or Orthoses: Spinal Brace      Mobility  Bed Mobility Overal bed mobility: Needs Assistance Bed Mobility: Sidelying to Sit   Sidelying to sit: Min assist       General bed mobility comments: up in chair    Transfers Overall transfer level: Needs assistance Equipment used: Rolling walker (2 wheels) Transfers: Sit  to/from Stand Sit to Stand: Min assist           General transfer comment: increased time with cues for hand placement technique with back precautions, performed x 3 going to BSC x 2    Ambulation/Gait Ambulation/Gait assistance: Min guard Gait Distance (Feet): 90 Feet Assistive device: Rolling walker (2 wheels) Gait Pattern/deviations: Step-to pattern;Step-through pattern;Decreased stride length;Trunk flexed;Wide base of support       General Gait Details: very slow pace, but steady and stiff throughout; limited distance due to back pain  Stairs            Wheelchair Mobility    Modified Rankin (Stroke Patients Only)       Balance Overall balance assessment: Mild deficits observed, not formally tested                                           Pertinent Vitals/Pain Pain Assessment: 0-10 Pain Score: 8  Pain Location: back Pain Descriptors / Indicators: Aching;Dull Pain Intervention(s): Monitored during session;Repositioned;Premedicated before session    Home Living Family/patient expects to be discharged to:: Private residence Living Arrangements: Parent;Spouse/significant other;Other relatives;Children Available Help at Discharge: Family;Available 24 hours/day Type of Home: House Home Access: Stairs to enter Entrance Stairs-Rails: Psychiatric nurse of Steps: 6   Home Layout: One level Home Equipment: BSC/3in1;Grab bars - tub/shower;Rolling Walker (2 wheels);Cane - single point;Adaptive equipment;Hand held shower head      Prior Function Prior Level of Function : Driving;Independent/Modified Independent  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 [employer]   Dominant Hand: Right    Extremity/Trunk Assessment   Upper Extremity Assessment Upper Extremity Assessment: Generalized weakness    Lower Extremity Assessment Lower Extremity Assessment: LLE deficits/detail;RLE deficits/detail RLE  Deficits / Details: generalized weakness RLE Sensation: decreased light touch (heel and lateral foot since surgery) LLE Deficits / Details: generalized weakness    Cervical / Trunk Assessment Cervical / Trunk Assessment: Back Surgery  Communication   Communication: No difficulties  Cognition Arousal/Alertness: Awake/alert Behavior During Therapy: WFL for tasks assessed/performed Overall Cognitive Status: Within Functional Limits for tasks assessed                                          General Comments General comments (skin integrity, edema, etc.): reveiwed precautions, pt attempting to urinate as RN reports foley out now for a bit, but unable despite two attempts prior to and after walking    Exercises Other Exercises Other Exercises: encouraged ankle pumps and  counterpressure exercises to increase BP   Assessment/Plan    PT Assessment Patient needs continued PT services  PT Problem List Decreased strength;Decreased mobility;Decreased balance;Decreased knowledge of use of DME;Pain;Decreased activity tolerance;Decreased knowledge of precautions       PT Treatment Interventions DME instruction;Therapeutic activities;Gait training;Therapeutic exercise;Patient/family education;Balance training;Stair training;Functional mobility training    PT Goals (Current goals can be found in the Care Plan section)  Acute Rehab PT Goals Patient Stated Goal: to return home to independent PT Goal Formulation: With patient Time For Goal Achievement: 03/17/21 Potential to Achieve Goals: Good    Frequency Min 5X/week   Barriers to discharge        Co-evaluation               AM-PAC PT "6 Clicks" Mobility  Outcome Measure Help needed turning from your back to your side while in a flat bed without using bedrails?: A Little Help needed moving from lying on your back to sitting on the side of a flat bed without using bedrails?: A Little Help needed moving to and  from a bed to a chair (including a wheelchair)?: A Little Help needed standing up from a chair using your arms (e.g., wheelchair or bedside chair)?: A Little Help needed to walk in hospital room?: A Little Help needed climbing 3-5 steps with a railing? : Total 6 Click Score: 16    End of Session Equipment Utilized During Treatment: Back brace;Gait belt Activity Tolerance: Patient tolerated treatment well Patient left: in chair;with call bell/phone within reach;with family/visitor present;with chair alarm set   PT Visit Diagnosis: Other abnormalities of gait and mobility (R26.89);Muscle weakness (generalized) (M62.81)    Time: 1132-1200 PT Time Calculation (min) (ACUTE ONLY): 28 min   Charges:   PT Evaluation $PT Eval Moderate Complexity: 1 Mod PT Treatments $Gait Training: 8-22 mins        Magda Kiel, PT Acute Rehabilitation Services KCMKL:[phone] Office:[phone] 03/10/2021   Reginia Naas 03/10/2021, 12:37 PM

## 2021-03-10 NOTE — Evaluation (Signed)
Occupational Therapy Evaluation Patient Details Name: Amy Hooper MRN: [id number] DOB: [birth year] Today's Date: 03/10/2021   History of Present Illness 70 yo s/p L4-5 TLIF; R L5-S1 microdiscectomy and repair of dura .PMH: CKD, Fibromyalgia, depression, Gout; HTN, L TKR, L TRC repair, R RTC injury.   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  Began education regarding compensatory strategies for ADL and mobility. Able to progress EOB and to the recliner without complaints of a headache however pt orthostatic. 105/60 EOB; 86/50 in recliner ('feeling loopy"); legs elevated; encouraged ankle pumps BP 94/55, nsg notified and BP taken again and 103/54. Anticipate pt will progress well and not require OT follow up . Will follow acutely to facilitate safe DC home.      Recommendations for follow up therapy are one component of a multi-disciplinary discharge planning process, led by the attending physician.  Recommendations may be updated based on patient status, additional functional criteria and insurance authorization.   Follow Up Recommendations  No OT follow up    Assistance Recommended at Discharge Intermittent Supervision/Assistance  Functional Status Assessment  Patient has had a recent decline in their functional status and demonstrates the ability to make significant improvements in function in a reasonable and predictable amount of time.  Equipment Recommendations  None recommended by OT    Recommendations for Other Services PT consult     Precautions / Restrictions Precautions Precautions: Back Precaution Booklet Issued: Yes (comment) Precaution Comments: watch BP Required Braces or Orthoses: Spinal Brace Restrictions Weight Bearing Restrictions: No      Mobility Bed Mobility Overal bed mobility: Needs Assistance Bed Mobility: Sidelying to Sit   Sidelying to sit: Min assist             Transfers Overall transfer level: Needs assistance Equipment used: Rolling walker (2 wheels) Transfers: Sit to/from Stand Sit to Stand: Min assist                  Balance Overall balance assessment: Mild deficits observed, not formally tested                                         ADL either performed or assessed with clinical judgement   ADL Overall ADL's : Needs assistance/impaired     Grooming: Set up   Upper Body Bathing: Minimal assistance   Lower Body Bathing: Moderate assistance;Sit to/from stand   Upper Body Dressing : Minimal assistance   Lower Body Dressing: Moderate assistance;Sit to/from stand   Toilet Transfer: Minimal assistance;Stand-pivot   Toileting- Clothing Manipulation and Hygiene: Maximal assistance       Functional mobility during ADLs: Minimal assistance;Rolling walker (2 wheels);Cueing for sequencing General ADL Comments: Began educaiton regarding back precautions; pt has AE     Vision Baseline Vision/History: 1 Wears glasses       Perception     Praxis      Pertinent Vitals/Pain Pain Assessment: 0-10 Pain Score: 8  Pain Location: back Pain Descriptors / Indicators: Aching;Dull Pain Intervention(s): Limited activity within patient's tolerance     Hand Dominance Right   Extremity/Trunk Assessment Upper Extremity Assessment Upper Extremity Assessment: Generalized weakness   Lower Extremity Assessment Lower Extremity Assessment: RLE deficits/detail (numbness in toes and heel which is new)   Cervical / Trunk Assessment Cervical / Trunk Assessment: Back Surgery  Communication Communication Communication: No difficulties   Cognition Arousal/Alertness: Awake/alert Behavior During Therapy: WFL for tasks assessed/performed Overall Cognitive Status: Within Functional Limits for tasks assessed                                       General Comments       Exercises Exercises: Other  exercises Other Exercises Other Exercises: encouraged ankle pumps and  counterpressure exercises to increase BP   Shoulder Instructions      Home Living Family/patient expects to be discharged to:: Private residence Living Arrangements: Parent;Spouse/significant other;Other relatives;Children Available Help at Discharge: Family;Available 24 hours/day Type of Home: House Home Access: Stairs to enter [employer] of Steps: 6 Entrance Stairs-Rails: Right;Left Home Layout: One level     Bathroom Shower/Tub: Tub/shower unit;Door   [employer] Toilet: Standard Bathroom Accessibility: Yes How Accessible: Accessible via wheelchair;Accessible via walker Home Equipment: BSC/3in1;Grab bars - tub/shower;Rolling Walker (2 wheels);Cane - single point;Adaptive equipment;Hand held shower head Adaptive Equipment: Reacher;Sock aid;Other (Comment) (toilet aid)        Prior Functioning/Environment Prior Level of Function : Driving;Independent/Modified Independent               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 [employer]        OT Problem List: Decreased activity tolerance;Decreased safety awareness;Decreased knowledge of use of DME or AE;Obesity;Pain;Decreased strength      OT Treatment/Interventions: Self-care/ADL training;DME and/or AE instruction;Therapeutic activities;Patient/family education    OT Goals(Current goals can be found in the care plan section) Acute Rehab OT Goals Patient Stated Goal: to get better and go home OT Goal Formulation: With patient Time For Goal Achievement: 03/24/21 Potential to Achieve Goals: Good  OT Frequency: Min 2X/week   Barriers to D/C:            Co-evaluation              AM-PAC OT "6 Clicks" Daily Activity     Outcome Measure Help from another person eating meals?: None Help from another person taking care of personal grooming?: A Little Help from another person toileting, which includes using toliet, bedpan, or urinal?: A Lot Help  from another person bathing (including washing, rinsing, drying)?: A Lot Help from another person to put on and taking off regular upper body clothing?: A Little Help from another person to put on and taking off regular lower body clothing?: A Lot 6 Click Score: 16   End of Session Equipment Utilized During Treatment: Gait belt;Rolling walker (2 wheels);Back brace Nurse Communication: Mobility status;Other (comment) (BP)  Activity Tolerance: Patient tolerated treatment well Patient left: in chair;with call bell/phone within reach  OT Visit Diagnosis: Unsteadiness on feet (R26.81);Muscle weakness (generalized) (M62.81);Pain Pain - part of body:  (back)                Time: 8115-7262 OT Time Calculation (min): 40 min Charges:  OT General Charges $OT Visit: 1 Visit OT Evaluation $OT Eval Moderate Complexity: 1 Mod OT Treatments $Self Care/Home Management : 23-37 mins  .ihl  [place], OT/L   Acute OT Clinical Specialist Acute Rehabilitation Services Pager [phone] Office [phone]  03/10/2021, 11:13 AM

## 2021-03-10 NOTE — Progress Notes (Signed)
Pt BP 87/50 P 60 post 400 ml bolus. Pt sitting up in chair c/o being lightheaded. Called Dr. Lorin Mercy. No new orders at this time. Transferred pt back to bed assist x 2 pt lying flat with SCDs on. Will continue with plan of care and recheck VS in 1 hour.

## 2021-03-10 NOTE — Progress Notes (Signed)
Per PT/OT pt felt lightheaded while walking. Pt BP noted to be 103/54. Per Dr. Lorin Mercy will administer 400 ml bolus. Will reassess pt BP after bolus.

## 2021-03-10 NOTE — Progress Notes (Signed)
Per Dr. Lorin Mercy pt able to sit up and able to work with therapy today if tolerating sitting up in bed without a headache. Foley removed at 0750.

## 2021-03-10 NOTE — Progress Notes (Signed)
Patient ID: Amy Hooper, female   DOB: 1951/01/14, 70 y.o.   MRN: [id number]   Subjective: 1 Day Post-Op Procedure(s) (LRB): RIGHT LUMBAR FOUR-FIVE TRANSFORAMINAL LUMBAR INTERBODY FUSION, PEDICLE INSTRUMENTATION, CAGE, LATERAL FUSION, RIGHT LUMBAR FIVE-SACRAL ONE MICRODISCECTOMY and REPAIR OF DURA (N/A) Patient reports pain as moderate and severe.    Objective: Vital signs in last 24 hours: Temp:  [97 F (36.1 C)-98.6 F (37 C)] 98.1 F (36.7 C) (12/20 0308) Pulse Rate:  [60-92] 65 (12/20 0308) Resp:  [8-27] 18 (12/20 0308) BP: (75-128)/(42-74) 108/55 (12/20 0308) SpO2:  [91 %-100 %] 92 % (12/20 0308)  Intake/Output from previous day: 12/19 0701 - 12/20 0700 In: 2487.2 [P.O.:480; I.V.:2007.2] Out: 1050 [Urine:900; Blood:150] Intake/Output this shift: No intake/output data recorded.  No results for input(s): HGB in the last 72 hours. No results for input(s): WBC, RBC, HCT, PLT in the last 72 hours. Recent Labs    03/09/21 0556  NA 139  K 3.6  CL 106  CO2 26  BUN 12  CREATININE 1.19*  GLUCOSE 96  CALCIUM 9.4   No results for input(s): LABPT, INR in the last 72 hours.  GS, AT, EHL 5/5 right and left.  Some numbness lateral right foot lateral 3 toes.  Gastrocsoleus is strong.  Peroneals are strong. DG Lumbar Spine 2-3 Views  Result Date: 03/09/2021 CLINICAL DATA:  L5-S1 discectomy. EXAM: LUMBAR SPINE - 2-3 VIEW; DG C-ARM 1-60 MIN-NO REPORT Radiation exposure index: 33.85 mGy. COMPARISON:  MRI of December 16, 2020. FINDINGS: Two intraoperative fluoroscopic images were obtained of the lower lumbar spine. These demonstrate the patient be status post surgical posterior fusion of L4-5 with bilateral intrapedicular screw placement and interbody fusion. IMPRESSION: Fluoroscopic guidance provided during lower lumbar surgery. Electronically Signed   By: Marijo Conception M.D.   On: 03/09/2021 12:53   DG C-Arm 1-60 Min-No Report  Result Date: 03/09/2021 CLINICAL DATA:   L5-S1 discectomy. EXAM: LUMBAR SPINE - 2-3 VIEW; DG C-ARM 1-60 MIN-NO REPORT Radiation exposure index: 33.85 mGy. COMPARISON:  MRI of December 16, 2020. FINDINGS: Two intraoperative fluoroscopic images were obtained of the lower lumbar spine. These demonstrate the patient be status post surgical posterior fusion of L4-5 with bilateral intrapedicular screw placement and interbody fusion. IMPRESSION: Fluoroscopic guidance provided during lower lumbar surgery. Electronically Signed   By: Marijo Conception M.D.   On: 03/09/2021 12:53   DG C-Arm 1-60 Min-No Report  Result Date: 03/09/2021 Fluoroscopy was utilized by the requesting physician.  No radiographic interpretation.   DG C-Arm 1-60 Min-No Report  Result Date: 03/09/2021 Fluoroscopy was utilized by the requesting physician.  No radiographic interpretation.   DG C-Arm 1-60 Min-No Report  Result Date: 03/09/2021 Fluoroscopy was utilized by the requesting physician.  No radiographic interpretation.   DG OR LOCAL ABDOMEN  Result Date: 03/09/2021 CLINICAL DATA:  70 year old female with a history of possible missing sponge EXAM: OR LOCAL ABDOMEN COMPARISON:  None. FINDINGS: Limited plain film of the abdomen. Surgical changes of L4-L5 discectomy infusion. No unexpected radiopaque foreign body of the lower abdomen/pelvis. IMPRESSION: No unexpected radiopaque foreign body. Results called to the operating room staff at the time of interpretation on 03/09/2021 at 12:41 pm Electronically Signed   By: Corrie Mckusick D.O.   On: 03/09/2021 12:41    Assessment/Plan: 1 Day Post-Op Procedure(s) (LRB): RIGHT LUMBAR FOUR-FIVE TRANSFORAMINAL LUMBAR INTERBODY FUSION, PEDICLE INSTRUMENTATION, CAGE, LATERAL FUSION, RIGHT LUMBAR FIVE-SACRAL ONE MICRODISCECTOMY and REPAIR OF DURA (N/A) Plan: Patient set up 45  degrees if she has no problems with headache she can begin therapy this morning.  If she develops headache we will put her back flat.  We will see how she does  with physical therapy ambulation with her brace and walker.  [name] 03/10/2021, 7:47 AM

## 2021-03-11 ENCOUNTER — Encounter: Payer: Self-pay | Admitting: Orthopaedic Surgery

## 2021-03-11 DIAGNOSIS — M5116 Intervertebral disc disorders with radiculopathy, lumbar region: Secondary | ICD-10-CM | POA: Diagnosis not present

## 2021-03-11 MED ORDER — BISACODYL 10 MG RE SUPP
10.0000 mg | Freq: Once | RECTAL | Status: AC
  Administered 2021-03-11: 10:00:00 10 mg via RECTAL
  Filled 2021-03-11: qty 1

## 2021-03-11 MED ORDER — HYDROCODONE-ACETAMINOPHEN 7.5-325 MG PO TABS: 1.0000 | ORAL_TABLET | ORAL | 0 refills | Status: DC | PRN

## 2021-03-11 MED ORDER — ORAL CARE MOUTH RINSE
15.0000 mL | Freq: Two times a day (BID) | OROMUCOSAL | Status: DC
  Administered 2021-03-11 (×2): 15 mL via OROMUCOSAL

## 2021-03-11 NOTE — Progress Notes (Addendum)
Patient ID: Amy Hooper, female   DOB: 06/16/1950, 70 y.o.   MRN: [id number]   Subjective: 2 Days Post-Op Procedure(s) (LRB): RIGHT LUMBAR FOUR-FIVE TRANSFORAMINAL LUMBAR INTERBODY FUSION, PEDICLE INSTRUMENTATION, CAGE, LATERAL FUSION, RIGHT LUMBAR FIVE-SACRAL ONE MICRODISCECTOMY and REPAIR OF DURA (N/A) Patient reports pain as mild incisional pain. No leg pain. Foot numbness improved. Walking in halls.   Objective: Vital signs in last 24 hours: Temp:  [97.7 F (36.5 C)-98.5 F (36.9 C)] 98.5 F (36.9 C) (12/21 0738) Pulse Rate:  [60-80] 80 (12/21 0738) Resp:  [17-18] 17 (12/21 0738) BP: (86-124)/(41-57) 102/44 (12/21 0738) SpO2:  [94 %-100 %] 94 % (12/21 0738)  Intake/Output from previous day: 12/20 0701 - 12/21 0700 In: 1614.8 [P.O.:720; I.V.:553.1; IV Piggyback:341.7] Out: 251 [Urine:251] Intake/Output this shift: No intake/output data recorded.  No results for input(s): HGB in the last 72 hours. No results for input(s): WBC, RBC, HCT, PLT in the last 72 hours. Recent Labs    03/09/21 0556  NA 139  K 3.6  CL 106  CO2 26  BUN 12  CREATININE 1.19*  GLUCOSE 96  CALCIUM 9.4   No results for input(s): LABPT, INR in the last 72 hours.  Neurologically intact, walking in halls. Mild numbness right foot improved. No weakness.  No results found.  Assessment/Plan: 2 Days Post-Op Procedure(s) (LRB): RIGHT LUMBAR FOUR-FIVE TRANSFORAMINAL LUMBAR INTERBODY FUSION, PEDICLE INSTRUMENTATION, CAGE, LATERAL FUSION, RIGHT LUMBAR FIVE-SACRAL ONE MICRODISCECTOMY and REPAIR OF DURA (N/A) Up with therapy this AM , discharge home , office one week.   Patient had Intra-Op dural tear on the right at L5-S1.  Primary repair performed.  Neurologically intact, no sensory or motor deficit.  Patient is happy with surgical result.    Amy Hooper 03/11/2021, 8:15 AM

## 2021-03-11 NOTE — Discharge Instructions (Signed)
Use your brace when you are up and walking.  Avoid bending turning and twisting.  Your bandage is waterproof and you can leave it on and to come back the office in 1 week.  If the dressing comes off you can remove it for showering and replace with gauze and tape so your close do not rub against the incision.

## 2021-03-11 NOTE — Progress Notes (Signed)
Occupational Therapy Treatment Patient Details Name: Amy Hooper MRN: [id number] DOB: 07-Aug-1950 Today's Date: 03/11/2021   History of present illness 70 yo s/p L4-5 TLIF; R L5-S1 microdiscectomy and repair of dura .PMH: CKD, Fibromyalgia, depression, Gout; HTN, L TKR, L TRC repair, R RTC injury.   OT comments  Patient with nice progress toward patient focused goals.  Education and use of hip kit for ADL from sit/stand level.  Discussed back precautions, sitting tolerance, and progressive mobility in the home.  Able to place brace with no assist.  Patient getting ready to discharge home.  OT recommends assist as needed at home via family.  All questions answered and patient verbalizes understanding of education.     Recommendations for follow up therapy are one component of a multi-disciplinary discharge planning process, led by the attending physician.  Recommendations may be updated based on patient status, additional functional criteria and insurance authorization.    Follow Up Recommendations  No OT follow up    Assistance Recommended at Discharge Intermittent Supervision/Assistance  Equipment Recommendations  Other (comment) (Reacher and LH Sponge)    Recommendations for Other Services      Precautions / Restrictions Precautions Precautions: Fall;Back Precaution Booklet Issued: Yes (comment) Required Braces or Orthoses: Spinal Brace Spinal Brace: Lumbar corset Restrictions Weight Bearing Restrictions: No Other Position/Activity Restrictions: R TKA       Mobility Bed Mobility               General bed mobility comments: up in chair    Transfers Overall transfer level: Needs assistance Equipment used: Rolling walker (2 wheels) Transfers: Sit to/from Stand Sit to Stand: Min assist                 Balance Overall balance assessment: Mild deficits observed, not formally tested                                         ADL either  performed or assessed with clinical judgement   ADL                   Upper Body Dressing : Set up;Sitting   Lower Body Dressing: With adaptive equipment;Cueing for compensatory techniques;Sit to/from stand;Min guard   Toilet Transfer: Minimal assistance;Stand-pivot           Functional mobility during ADLs: Minimal assistance;Rolling walker (2 wheels);Cueing for sequencing General ADL Comments: sit to stand is Min A    Extremity/Trunk Assessment                                Cognition Arousal/Alertness: Awake/alert Behavior During Therapy: WFL for tasks assessed/performed Overall Cognitive Status: Within Functional Limits for tasks assessed                                                              Pertinent Vitals/ Pain       Pain Assessment: Faces Faces Pain Scale: Hurts little more Pain Location: back Pain Descriptors / Indicators: Aching;Dull Pain Intervention(s): Monitored during session  Frequency           Progress Toward Goals  OT Goals(current goals can now be found in the care plan section)  Progress towards OT goals: Progressing toward goals  Acute Rehab OT Goals OT Goal Formulation: With patient Time For Goal Achievement: 03/24/21 Potential to Achieve Goals: Good  Plan Discharge plan remains appropriate    Co-evaluation                 AM-PAC OT "6 Clicks" Daily Activity     Outcome Measure   Help from another person eating meals?: None Help from another person taking care of personal grooming?: None Help from another person toileting, which includes using toliet, bedpan, or urinal?: None Help from another person bathing (including washing, rinsing, drying)?: A Little Help from another person to put on and taking off regular upper body clothing?: None Help from another person to put on and taking off  regular lower body clothing?: A Little 6 Click Score: 22    End of Session Equipment Utilized During Treatment: Gait belt;Rolling walker (2 wheels);Back brace  OT Visit Diagnosis: Unsteadiness on feet (R26.81);Muscle weakness (generalized) (M62.81);Pain   Activity Tolerance Patient tolerated treatment well   Patient Left in chair;with call bell/phone within reach   Nurse Communication          Time: 6979-4801 OT Time Calculation (min): 23 min  Charges: OT General Charges $OT Visit: 1 Visit OT Treatments $Self Care/Home Management : 23-37 mins  03/11/2021  RP, OTR/L  Acute Rehabilitation Services  Office:  [phone]   Amy Hooper 03/11/2021, 12:20 PM

## 2021-03-11 NOTE — Progress Notes (Signed)
Physical Therapy Treatment Patient Details Name: Amy Hooper MRN: [id number] DOB: 08/27/1950 Today's Date: 03/11/2021   History of Present Illness 70 yo s/p L4-5 TLIF; R L5-S1 microdiscectomy and repair of dura .PMH: CKD, Fibromyalgia, depression, Gout; HTN, L TKR, L TRC repair, R RTC injury.    PT Comments    Patient received as handoff from OT in hallway. Patient ambulated 200' with supervision and RW. Able to negotiate 3 stairs with R handrail + HHAx1 and min guard. Reinforced back precautions and encouraged progressive mobility at home. Patient progressing well and ready for d/c home. No PT follow up recommended at this time.     Recommendations for follow up therapy are one component of a multi-disciplinary discharge planning process, led by the attending physician.  Recommendations may be updated based on patient status, additional functional criteria and insurance authorization.  Follow Up Recommendations  No PT follow up     Assistance Recommended at Discharge Intermittent Supervision/Assistance  Equipment Recommendations  Rolling Zelene Barga (2 wheels)    Recommendations for Other Services       Precautions / Restrictions Precautions Precautions: Fall;Back Precaution Booklet Issued: Yes (comment) Required Braces or Orthoses: Spinal Brace Spinal Brace: Lumbar corset Restrictions Weight Bearing Restrictions: No Other Position/Activity Restrictions: R TKA     Mobility  Bed Mobility               General bed mobility comments: handoff from OT in hallway    Transfers Overall transfer level: Needs assistance Equipment used: Rolling Torri Michalski (2 wheels) Transfers: Sit to/from Stand Sit to Stand: Supervision                Ambulation/Gait Ambulation/Gait assistance: Supervision Gait Distance (Feet): 200 Feet Assistive device: Rolling Capers Hagmann (2 wheels) Gait Pattern/deviations: Step-to pattern;Step-through pattern;Decreased stride length;Trunk  flexed;Wide base of support Gait velocity: decreased     General Gait Details: supervision for safety. Slow steady pace throughout   Stairs Stairs: Yes Stairs assistance: Min guard Stair Management: One rail Right;Step to pattern;Forwards (HHAx1 on opposite side) Number of Stairs: 3 General stair comments: min guard for safety   Wheelchair Mobility    Modified Rankin (Stroke Patients Only)       Balance Overall balance assessment: Mild deficits observed, not formally tested                                          Cognition Arousal/Alertness: Awake/alert Behavior During Therapy: WFL for tasks assessed/performed Overall Cognitive Status: Within Functional Limits for tasks assessed                                          Exercises      General Comments        Pertinent Vitals/Pain Pain Assessment: Faces Faces Pain Scale: Hurts little more Pain Location: back Pain Descriptors / Indicators: Aching;Dull Pain Intervention(s): Monitored during session    Home Living                          Prior Function            PT Goals (current goals can now be found in the care plan section) Acute Rehab PT Goals PT Goal Formulation: With patient Time For Goal Achievement: 03/17/21  Potential to Achieve Goals: Good Progress towards PT goals: Progressing toward goals    Frequency    Min 5X/week      PT Plan Current plan remains appropriate    Co-evaluation              AM-PAC PT "6 Clicks" Mobility   Outcome Measure  Help needed turning from your back to your side while in a flat bed without using bedrails?: A Little Help needed moving from lying on your back to sitting on the side of a flat bed without using bedrails?: A Little Help needed moving to and from a bed to a chair (including a wheelchair)?: A Little Help needed standing up from a chair using your arms (e.g., wheelchair or bedside chair)?: A  Little Help needed to walk in hospital room?: A Little Help needed climbing 3-5 steps with a railing? : A Little 6 Click Score: 18    End of Session Equipment Utilized During Treatment: Back brace Activity Tolerance: Patient tolerated treatment well Patient left: in chair;with call bell/phone within reach Nurse Communication: Mobility status PT Visit Diagnosis: Other abnormalities of gait and mobility (R26.89);Muscle weakness (generalized) (M62.81)     Time: 1210-1222 PT Time Calculation (min) (ACUTE ONLY): 12 min  Charges:  $Gait Training: 8-22 mins                     Smt. Loder A. Gilford Rile PT, DPT Acute Rehabilitation Services Pager [phone] Office [phone]    Linna Hoff 03/11/2021, 12:47 PM

## 2021-03-11 NOTE — Progress Notes (Addendum)
Pt with order to d/c home. Assessment complete pt is stable. IV removed. Education provided to pt on d/c instructions all questions answered. Prescriptions sent to pharmacy pt aware. Pt and all belongings transported to private vehicle via wheelchair without incident.

## 2021-03-11 NOTE — Progress Notes (Signed)
Pt stable during shift. Pt was out of chair and ambulated in hallway 100 ft with back brace on, using a walker and RN supervision. Delia Heady RN

## 2021-03-12 MED ORDER — TIZANIDINE HCL 4 MG PO TABS
4.0000 mg | ORAL_TABLET | Freq: Three times a day (TID) | ORAL | 0 refills | Status: DC | PRN
Start: 2021-03-12 — End: 2021-08-26

## 2021-03-17 ENCOUNTER — Other Ambulatory Visit: Payer: Self-pay

## 2021-03-17 ENCOUNTER — Ambulatory Visit (INDEPENDENT_AMBULATORY_CARE_PROVIDER_SITE_OTHER): Payer: BC Managed Care – PPO

## 2021-03-17 ENCOUNTER — Ambulatory Visit (INDEPENDENT_AMBULATORY_CARE_PROVIDER_SITE_OTHER): Payer: BC Managed Care – PPO | Admitting: Orthopaedic Surgery

## 2021-03-17 ENCOUNTER — Encounter: Payer: Self-pay | Admitting: Orthopaedic Surgery

## 2021-03-17 VITALS — BP 104/65 | HR 55 | Ht 62.0 in | Wt 217.0 lb

## 2021-03-17 DIAGNOSIS — Z981 Arthrodesis status: Secondary | ICD-10-CM

## 2021-03-17 MED ORDER — ONDANSETRON HCL 4 MG PO TABS: 4.0000 mg | ORAL_TABLET | Freq: Three times a day (TID) | ORAL | 0 refills | Status: DC | PRN

## 2021-03-17 MED ORDER — HYDROCODONE-ACETAMINOPHEN 7.5-325 MG PO TABS: 1.0000 | ORAL_TABLET | ORAL | 0 refills | Status: DC | PRN

## 2021-03-17 NOTE — Progress Notes (Signed)
Post-Op Visit Note   [name]           Date of Birth: [birth year]           MRN: [id number] Visit Date: 03/17/2021 PCP: Isaac Bliss, [name], MD   Assessment & Plan: Patient returns states her leg feels good good strength no numbness or tingling she has been walking with a walker to help.  Puffiness of the back incision.  New dressing applied.  Return 1 week for staple removal.  Norco and Zofran refilled.  She will call if she needs the Zanaflex refill which was recently refilled.  Recheck 1 week for staple removal.  She is taking 1 pain pill every 6 hours.  Chief Complaint:  Chief Complaint  Patient presents with   Lower Back - Routine Post Op    03/09/2021 Right L4-5 TLIF, Right L5-S1 microdiscectomy and repair of dura   Visit Diagnoses:  1. Status post lumbar spinal fusion     Plan: Return 1 week for probable staple removal.  She is happy with the results of surgery and will gradually increase her walking.  Follow-Up Instructions: No follow-ups on file.   Orders:  Orders Placed This Encounter  Procedures   XR Lumbar Spine 2-3 Views   No orders of the defined types were placed in this encounter.   Imaging: No results found.  PMFS History: Patient Active Problem List   Diagnosis Date Noted   Lumbar stenosis 03/09/2021   Degenerative spondylolisthesis 02/19/2021   Protrusion of lumbar intervertebral disc 01/14/2021   Primary osteoarthritis of left knee 09/08/2020   DJD (degenerative joint disease) of knee 09/08/2020   Status post total left knee replacement 09/08/2020   Spondylosis without myelopathy or radiculopathy, lumbar region 07/17/2020   Chronic pain syndrome 07/17/2020   Myalgia 07/17/2020   OSA (obstructive sleep apnea) 05/19/2020   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/17/2020   Sleep disturbance 04/17/2020   Chronic pain of both shoulders 03/03/2020   Pain in both hands 03/03/2020   Primary osteoarthritis of  both knees 03/03/2020   Pain in both feet 03/03/2020   History of chronic kidney disease 03/03/2020   DDD (degenerative disc disease), lumbar 02/05/2020   Vitamin D deficiency 01/09/2020   Hyperlipidemia 01/09/2020   CKD (chronic kidney disease) stage 3, GFR 30-59 ml/min ([place]) 01/09/2020   HTN (hypertension)    Morbid obesity ([place])    Gout    Fibromyalgia    Depression    Insomnia    Past Medical History:  Diagnosis Date   Chronic kidney disease    Depression    Fibromyalgia    Gout    HTN (hypertension)    Insomnia    Morbid obesity ([facility])    Osteoarthritis    Palpitation    Sleep apnea     Family History  Problem Relation Age of Onset   Hypothyroidism Mother    Hyperlipidemia Other    Kidney failure Father    Dementia Father    Gout Father    Arthritis Father    Prostate cancer Father     Past Surgical History:  Procedure Laterality Date   JOINT REPLACEMENT     KNEE ARTHROSCOPY     right rotator cuff     ROTATOR CUFF REPAIR Left    TOTAL KNEE ARTHROPLASTY Left 09/08/2020   Procedure: LEFT TOTAL KNEE ARTHROPLASTY;  Surgeon: Leandrew Koyanagi, MD;  Location: [facility];  Service: Orthopedics;  Laterality: Left;  TUBAL LIGATION     Social History   Occupational History   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

## 2021-03-23 NOTE — Discharge Summary (Signed)
Patient ID: Amy Hooper MRN: [id number] DOB/AGE: 69-Oct-1952 69 y.o.  Admit date: 03/09/2021 Discharge date: 03/11/2021  Admission Diagnoses:  Principal Problem:   Lumbar stenosis Active Problems:   Protrusion of lumbar intervertebral disc   Discharge Diagnoses:  Principal Problem:   Lumbar stenosis Active Problems:   Protrusion of lumbar intervertebral disc  status post Procedure(s): RIGHT LUMBAR FOUR-FIVE TRANSFORAMINAL LUMBAR INTERBODY FUSION, PEDICLE INSTRUMENTATION, CAGE, LATERAL FUSION, RIGHT LUMBAR FIVE-SACRAL ONE MICRODISCECTOMY and REPAIR OF DURA  Past Medical History:  Diagnosis Date   Chronic kidney disease    Depression    Fibromyalgia    Gout    HTN (hypertension)    Insomnia    Morbid obesity ([place])    Osteoarthritis    Palpitation    Sleep apnea     Surgeries: Procedure(s): RIGHT LUMBAR FOUR-FIVE TRANSFORAMINAL LUMBAR INTERBODY FUSION, PEDICLE INSTRUMENTATION, CAGE, LATERAL FUSION, RIGHT LUMBAR FIVE-SACRAL ONE MICRODISCECTOMY and REPAIR OF DURA on 03/09/2021   Consultants:   Discharged Condition: Improved  Hospital Course: Amy Hooper is an 71 y.o. female who was admitted 03/09/2021 for operative treatment of Lumbar stenosis. Patient failed conservative treatments (please see the history and physical for the specifics) and had severe unremitting pain that affects sleep, daily activities and work/hobbies. After pre-op clearance, the patient was taken to the operating room on 03/09/2021 and underwent  Procedure(s): RIGHT LUMBAR FOUR-FIVE TRANSFORAMINAL LUMBAR INTERBODY FUSION, PEDICLE INSTRUMENTATION, CAGE, LATERAL FUSION, RIGHT LUMBAR FIVE-SACRAL ONE MICRODISCECTOMY and REPAIR OF DURA.    Patient was given perioperative antibiotics:  Anti-infectives (From admission, onward)    Start     Dose/Rate Route Frequency Ordered Stop   03/09/21 0600  vancomycin (VANCOCIN) IVPB 1000 mg/200 mL premix        1,000 mg 200 mL/hr over 60 Minutes  Intravenous On call to O.R. 03/09/21 0554 03/09/21 1819        Patient was given sequential compression devices and early ambulation to prevent DVT.   Patient benefited maximally from hospital stay and there were no complications. At the time of discharge, the patient was urinating/moving their bowels without difficulty, tolerating a regular diet, pain is controlled with oral pain medications and they have been cleared by PT/OT.   Recent vital signs: No data found.   Recent laboratory studies: No results for input(s): WBC, HGB, HCT, PLT, NA, K, CL, CO2, BUN, CREATININE, GLUCOSE, INR, CALCIUM in the last 72 hours.  Invalid input(s): PT, 2   Discharge Medications:   Allergies as of 03/11/2021       Reactions   Cefuroxime Axetil Rash, Hives   Clarithromycin Rash, Hives   Other reaction(s): Unknown   Nsaids Nausea And Vomiting   Other reaction(s): Unknown   Cefuroxime    Other reaction(s): Unknown   Oxycodone Other (See Comments)   hallucinations   Oxycodone-acetaminophen Other (See Comments)   Hallucination Other reaction(s): Unknown   Robaxin [methocarbamol] Nausea And Vomiting   "It tears my stomach up."   Toradol [ketorolac Tromethamine] Nausea And Vomiting   Tramadol Nausea And Vomiting, Other (See Comments)   Upset stomach Other reaction(s): Unknown        Medication List     TAKE these medications    allopurinol 100 MG tablet Commonly known as: ZYLOPRIM TAKE 2 TABLETS BY MOUTH EVERY DAY   amLODipine 5 MG tablet Commonly known as: NORVASC TAKE 1 TABLET (5 MG TOTAL) BY MOUTH DAILY.   atorvastatin 10 MG tablet Commonly known as: LIPITOR TAKE 1 TABLET BY  MOUTH EVERYDAY AT BEDTIME   diazepam 5 MG tablet Commonly known as: VALIUM Take 1 by mouth 1 hour  pre-procedure with very light food. May bring 2nd tablet to appointment.   DULoxetine 30 MG capsule Commonly known as: CYMBALTA TAKE 1 CAPSULE BY MOUTH EVERY DAY What changed:  how much to take how to  take this when to take this   eszopiclone 2 MG Tabs tablet Commonly known as: LUNESTA TAKE 1 TABLET BY MOUTH AT NIGHT IMMEDIATELY BEFORE BEDTIME   fluticasone 50 MCG/ACT nasal spray Commonly known as: FLONASE Place 1 spray into both nostrils daily as needed for allergies.   hydroxychloroquine 200 MG tablet Commonly known as: Plaquenil Take 1 tablet (200 mg total) by mouth daily.   loratadine 10 MG tablet Commonly known as: CLARITIN Take 10 mg by mouth daily as needed for allergies.   losartan 50 MG tablet Commonly known as: COZAAR TAKE 1 TABLET BY MOUTH EVERY DAY   predniSONE 10 MG tablet Commonly known as: DELTASONE Take 10 mg by mouth as needed.   pregabalin 50 MG capsule Commonly known as: Lyrica Take 1 capsule (50 mg total) by mouth 3 (three) times daily.   topiramate 25 MG tablet Commonly known as: Topamax Take 1 tablet (25 mg total) by mouth at bedtime.        Diagnostic Studies: DG Lumbar Spine 2-3 Views  Result Date: 03/09/2021 CLINICAL DATA:  L5-S1 discectomy. EXAM: LUMBAR SPINE - 2-3 VIEW; DG C-ARM 1-60 MIN-NO REPORT Radiation exposure index: 33.85 mGy. COMPARISON:  MRI of December 16, 2020. FINDINGS: Two intraoperative fluoroscopic images were obtained of the lower lumbar spine. These demonstrate the patient be status post surgical posterior fusion of L4-5 with bilateral intrapedicular screw placement and interbody fusion. IMPRESSION: Fluoroscopic guidance provided during lower lumbar surgery. Electronically Signed   By: Marijo Conception M.D.   On: 03/09/2021 12:53   DG C-Arm 1-60 Min-No Report  Result Date: 03/09/2021 CLINICAL DATA:  L5-S1 discectomy. EXAM: LUMBAR SPINE - 2-3 VIEW; DG C-ARM 1-60 MIN-NO REPORT Radiation exposure index: 33.85 mGy. COMPARISON:  MRI of December 16, 2020. FINDINGS: Two intraoperative fluoroscopic images were obtained of the lower lumbar spine. These demonstrate the patient be status post surgical posterior fusion of L4-5 with  bilateral intrapedicular screw placement and interbody fusion. IMPRESSION: Fluoroscopic guidance provided during lower lumbar surgery. Electronically Signed   By: Marijo Conception M.D.   On: 03/09/2021 12:53   DG C-Arm 1-60 Min-No Report  Result Date: 03/09/2021 Fluoroscopy was utilized by the requesting physician.  No radiographic interpretation.   DG C-Arm 1-60 Min-No Report  Result Date: 03/09/2021 Fluoroscopy was utilized by the requesting physician.  No radiographic interpretation.   DG C-Arm 1-60 Min-No Report  Result Date: 03/09/2021 Fluoroscopy was utilized by the requesting physician.  No radiographic interpretation.   XR Lumbar Spine 2-3 Views  Result Date: 03/17/2021 AP lateral lumbar spine images demonstrate right TLIF interbody fusion pedicle instrumentation satisfactory position of screws cage and bone graft. Impression: Satisfactory L4-5 interbody fusion.  DG OR LOCAL ABDOMEN  Result Date: 03/09/2021 CLINICAL DATA:  71 year old female with a history of possible missing sponge EXAM: OR LOCAL ABDOMEN COMPARISON:  None. FINDINGS: Limited plain film of the abdomen. Surgical changes of L4-L5 discectomy infusion. No unexpected radiopaque foreign body of the lower abdomen/pelvis. IMPRESSION: No unexpected radiopaque foreign body. Results called to the operating room staff at the time of interpretation on 03/09/2021 at 12:41 pm Electronically Signed   By: York Cerise  Earleen Newport D.O.   On: 03/09/2021 12:41    Discharge Instructions     Incentive spirometry RT   Complete by: As directed         Follow-up Information     [name], MD Follow up in 1 week(s).   Specialty: Orthopedic Surgery Contact information: [address] [place] [address] [phone]                 Discharge Plan:  discharge to home  Disposition:     Signed: Benjiman Core  03/23/2021, 4:40 PM

## 2021-03-24 ENCOUNTER — Encounter (HOSPITAL_COMMUNITY): Payer: Self-pay | Admitting: Orthopaedic Surgery

## 2021-03-24 ENCOUNTER — Other Ambulatory Visit: Payer: Self-pay

## 2021-03-24 ENCOUNTER — Encounter: Payer: Self-pay | Admitting: Orthopaedic Surgery

## 2021-03-24 ENCOUNTER — Ambulatory Visit (INDEPENDENT_AMBULATORY_CARE_PROVIDER_SITE_OTHER): Payer: BC Managed Care – PPO | Admitting: Orthopaedic Surgery

## 2021-03-24 DIAGNOSIS — T8140XA Infection following a procedure, unspecified, initial encounter: Secondary | ICD-10-CM | POA: Insufficient documentation

## 2021-03-24 DIAGNOSIS — Z981 Arthrodesis status: Secondary | ICD-10-CM

## 2021-03-24 DIAGNOSIS — T8141XA Infection following a procedure, superficial incisional surgical site, initial encounter: Secondary | ICD-10-CM

## 2021-03-24 NOTE — Progress Notes (Signed)
Post-Op Visit Note   [name]           Date of Birth: 01/07/1951           MRN: [id number] Visit Date: 03/24/2021 PCP: Isaac Bliss, [name], MD   Assessment & Plan: Patient returns post instrumented fusion.  She lost weight in order to get her lumbar fusion is continue to diet and has had poor p.o. intake with preoperative albumin 3.5.  She states she really has not been very hungry.  Incisions continue to drain clear fluid she has a ballotable seroma at the inferior aspect of her incision and some erythema around the staples but no current cellulitis.  Currently she has a draining seroma.  I discussed the patient that I would recommend proceeding with washing this out applying Ulice Bold and she needs to increase her protein intake in order to get her incision to heal.  Better to address this early before she develops the postop infection.  Plan would be application of a VAC either small VAC versus incisional VAC depending what the tissue looks like, obtaining cultures and then using culture specific antibiotics if needed.  Today she is afebrile.  She has had some drainage on her dressing but there is no fluid expressible today for culture.  Will obtain cultures Intra-Op tomorrow.  Chief Complaint:  Chief Complaint  Patient presents with   Lower Back - Follow-up, Routine Post Op    03/09/2021 Right L4-5 TLIF, Right L5-S1 microdiscectomy and repair of dura   Visit Diagnoses:  1. Infection of superficial incisional surgical site after procedure, initial encounter   2. S/P lumbar fusion     Plan: New dressing applied plan for washout for draining seroma and VAC application if needed.  Follow-Up Instructions: No follow-ups on file.   Orders:  No orders of the defined types were placed in this encounter.  No orders of the defined types were placed in this encounter.   Imaging: No results found.  PMFS History: Patient Active Problem List   Diagnosis Date  Noted   Post op infection 03/24/2021   S/P lumbar fusion 03/24/2021   Lumbar stenosis 03/09/2021   Degenerative spondylolisthesis 02/19/2021   Primary osteoarthritis of left knee 09/08/2020   DJD (degenerative joint disease) of knee 09/08/2020   Status post total left knee replacement 09/08/2020   Spondylosis without myelopathy or radiculopathy, lumbar region 07/17/2020   Chronic pain syndrome 07/17/2020   Myalgia 07/17/2020   OSA (obstructive sleep apnea) 05/19/2020   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/17/2020   Sleep disturbance 04/17/2020   Chronic pain of both shoulders 03/03/2020   Pain in both hands 03/03/2020   Primary osteoarthritis of both knees 03/03/2020   Pain in both feet 03/03/2020   History of chronic kidney disease 03/03/2020   DDD (degenerative disc disease), lumbar 02/05/2020   Vitamin D deficiency 01/09/2020   Hyperlipidemia 01/09/2020   CKD (chronic kidney disease) stage 3, GFR 30-59 ml/min ([place]) 01/09/2020   HTN (hypertension)    Morbid obesity ([place])    Gout    Fibromyalgia    Depression    Insomnia    Past Medical History:  Diagnosis Date   Chronic kidney disease    Depression    Fibromyalgia    Gout    HTN (hypertension)    Insomnia    Morbid obesity ([place])    Osteoarthritis    Palpitation    Sleep apnea     Family History  Problem Relation Age of Onset   Hypothyroidism Mother    Hyperlipidemia Other    Kidney failure Father    Dementia Father    Gout Father    Arthritis Father    Prostate cancer Father     Past Surgical History:  Procedure Laterality Date   JOINT REPLACEMENT     KNEE ARTHROSCOPY     right rotator cuff     ROTATOR CUFF REPAIR Left    TOTAL KNEE ARTHROPLASTY Left 09/08/2020   Procedure: LEFT TOTAL KNEE ARTHROPLASTY;  Surgeon: Leandrew Koyanagi, MD;  Location: [facility];  Service: Orthopedics;  Laterality: Left;   TUBAL LIGATION     Social History   Occupational History   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

## 2021-03-24 NOTE — Progress Notes (Signed)
PCP - Lelon Frohlich, MD Cardiologist - Denies  PPM/ICD - Denies   Chest x-ray - 09/02/20 EKG - 03/09/21 Stress Test - denies ECHO - denies Cardiac Cath - denies  CPAP - Will bring to hospital  ERAS Protcol - Clears until [place]  Anesthesia review: N  Patient verbally denies any shortness of breath, fever, cough and chest pain during phone call   -------------  SDW INSTRUCTIONS given:  Your procedure is scheduled on 03/25/21.  Report to [facility] Main Entrance "A" at 1315 PM, and check in at the Admitting office.  Call this number if you have problems the morning of surgery:  [phone]   Remember:  Do not eat after midnight the night before your surgery  You may drink clear liquids until 1245 the morning of your surgery.   Clear liquids allowed are: Water, Non-Citrus Juices (without pulp), Carbonated Beverages, Clear Tea, Black Coffee Only, and Gatorade    Take these medicines the morning of surgery with A SIP OF WATER allopurinol, amlodipine, lyrica, Norco, flonase, zanaflex, claritin  As of today, STOP taking any Aspirin (unless otherwise instructed by your surgeon) Aleve, Naproxen, Ibuprofen, Motrin, Advil, Goody's, BC's, all herbal medications, fish oil, and all vitamins.                      Do not wear jewelry, make up, or nail polish            Do not wear lotions, powders, perfumes/colognes, or deodorant.            Do not shave 48 hours prior to surgery.  Men may shave face and neck.            Do not bring valuables to the hospital.            [facility] is not responsible for any belongings or valuables.  Do NOT Smoke (Tobacco/Vaping) or drink Alcohol 24 hours prior to your procedure If you use a CPAP at night, you may bring all equipment for your overnight stay.   Contacts, glasses, dentures or bridgework may not be worn into surgery.      For patients admitted to the hospital, discharge time will be determined by your  treatment team.   Patients discharged the day of surgery will not be allowed to drive home, and someone needs to stay with them for 24 hours.    Special instructions:   [facility]- Preparing For Surgery  Before surgery, you can play an important role. Because skin is not sterile, your skin needs to be as free of germs as possible. You can reduce the number of germs on your skin by washing with CHG (chlorahexidine gluconate) Soap before surgery.  CHG is an antiseptic cleaner which kills germs and bonds with the skin to continue killing germs even after washing.    Oral Hygiene is also important to reduce your risk of infection.  Remember - BRUSH YOUR TEETH THE MORNING OF SURGERY WITH YOUR REGULAR TOOTHPASTE  Please do not use if you have an allergy to CHG or antibacterial soaps. If your skin becomes reddened/irritated stop using the CHG.  Do not shave (including legs and underarms) for at least 48 hours prior to first CHG shower. It is OK to shave your face.  Please follow these instructions carefully.   Shower the NIGHT BEFORE SURGERY and the MORNING OF SURGERY with DIAL Soap.   Pat yourself dry with a  CLEAN TOWEL.  Wear CLEAN PAJAMAS to bed the night before surgery  Place CLEAN SHEETS on your bed the night of your first shower and DO NOT SLEEP WITH PETS.   Day of Surgery: Please shower morning of surgery  Wear Clean/Comfortable clothing the morning of surgery Do not apply any deodorants/lotions.   Remember to brush your teeth WITH YOUR REGULAR TOOTHPASTE.   Questions were answered. Patient verbalized understanding of instructions.

## 2021-03-25 ENCOUNTER — Inpatient Hospital Stay (HOSPITAL_COMMUNITY): Payer: BC Managed Care – PPO | Admitting: Anesthesiology

## 2021-03-25 ENCOUNTER — Inpatient Hospital Stay (HOSPITAL_COMMUNITY)
Admission: RE | Admit: 2021-03-25 | Discharge: 2021-04-06 | DRG: 857 | Disposition: A | Discharge status: Home Health | Payer: BC Managed Care – PPO | Attending: Orthopaedic Surgery | Admitting: Orthopaedic Surgery

## 2021-03-25 ENCOUNTER — Encounter (HOSPITAL_COMMUNITY)
Admission: RE | Disposition: A | Discharge status: Home Health | Payer: Self-pay | Source: Home / Self Care | Attending: Orthopaedic Surgery

## 2021-03-25 ENCOUNTER — Encounter (HOSPITAL_COMMUNITY): Payer: Self-pay | Admitting: Orthopaedic Surgery

## 2021-03-25 DIAGNOSIS — Z20822 Contact with and (suspected) exposure to covid-19: Secondary | ICD-10-CM | POA: Diagnosis present

## 2021-03-25 DIAGNOSIS — F32A Depression, unspecified: Secondary | ICD-10-CM | POA: Diagnosis present

## 2021-03-25 DIAGNOSIS — Z841 Family history of disorders of kidney and ureter: Secondary | ICD-10-CM

## 2021-03-25 DIAGNOSIS — G4700 Insomnia, unspecified: Secondary | ICD-10-CM | POA: Diagnosis present

## 2021-03-25 DIAGNOSIS — I129 Hypertensive chronic kidney disease with stage 1 through stage 4 chronic kidney disease, or unspecified chronic kidney disease: Secondary | ICD-10-CM | POA: Diagnosis present

## 2021-03-25 DIAGNOSIS — Z6838 Body mass index (BMI) 38.0-38.9, adult: Secondary | ICD-10-CM

## 2021-03-25 DIAGNOSIS — G4733 Obstructive sleep apnea (adult) (pediatric): Secondary | ICD-10-CM | POA: Diagnosis present

## 2021-03-25 DIAGNOSIS — N289 Disorder of kidney and ureter, unspecified: Secondary | ICD-10-CM

## 2021-03-25 DIAGNOSIS — N183 Chronic kidney disease, stage 3 unspecified: Secondary | ICD-10-CM | POA: Diagnosis present

## 2021-03-25 DIAGNOSIS — T8149XA Infection following a procedure, other surgical site, initial encounter: Secondary | ICD-10-CM

## 2021-03-25 DIAGNOSIS — R110 Nausea: Secondary | ICD-10-CM

## 2021-03-25 DIAGNOSIS — M797 Fibromyalgia: Secondary | ICD-10-CM | POA: Diagnosis present

## 2021-03-25 DIAGNOSIS — T8141XA Infection following a procedure, superficial incisional surgical site, initial encounter: Principal | ICD-10-CM | POA: Diagnosis present

## 2021-03-25 DIAGNOSIS — K5900 Constipation, unspecified: Secondary | ICD-10-CM

## 2021-03-25 DIAGNOSIS — G9763 Postprocedural seroma of a nervous system organ or structure following a nervous system procedure: Secondary | ICD-10-CM | POA: Diagnosis present

## 2021-03-25 DIAGNOSIS — L7682 Other postprocedural complications of skin and subcutaneous tissue: Secondary | ICD-10-CM | POA: Diagnosis not present

## 2021-03-25 DIAGNOSIS — Z888 Allergy status to other drugs, medicaments and biological substances status: Secondary | ICD-10-CM

## 2021-03-25 DIAGNOSIS — E785 Hyperlipidemia, unspecified: Secondary | ICD-10-CM | POA: Diagnosis present

## 2021-03-25 DIAGNOSIS — T8189XA Other complications of procedures, not elsewhere classified, initial encounter: Secondary | ICD-10-CM | POA: Diagnosis present

## 2021-03-25 DIAGNOSIS — G894 Chronic pain syndrome: Secondary | ICD-10-CM | POA: Diagnosis present

## 2021-03-25 DIAGNOSIS — Z881 Allergy status to other antibiotic agents status: Secondary | ICD-10-CM

## 2021-03-25 DIAGNOSIS — Z885 Allergy status to narcotic agent status: Secondary | ICD-10-CM

## 2021-03-25 DIAGNOSIS — N189 Chronic kidney disease, unspecified: Secondary | ICD-10-CM

## 2021-03-25 DIAGNOSIS — M109 Gout, unspecified: Secondary | ICD-10-CM | POA: Diagnosis present

## 2021-03-25 DIAGNOSIS — Z01818 Encounter for other preprocedural examination: Secondary | ICD-10-CM

## 2021-03-25 DIAGNOSIS — Z96652 Presence of left artificial knee joint: Secondary | ICD-10-CM | POA: Diagnosis present

## 2021-03-25 DIAGNOSIS — Z8261 Family history of arthritis: Secondary | ICD-10-CM

## 2021-03-25 DIAGNOSIS — Z79899 Other long term (current) drug therapy: Secondary | ICD-10-CM

## 2021-03-25 DIAGNOSIS — J029 Acute pharyngitis, unspecified: Secondary | ICD-10-CM | POA: Diagnosis not present

## 2021-03-25 DIAGNOSIS — Z8042 Family history of malignant neoplasm of prostate: Secondary | ICD-10-CM

## 2021-03-25 DIAGNOSIS — E559 Vitamin D deficiency, unspecified: Secondary | ICD-10-CM | POA: Diagnosis present

## 2021-03-25 DIAGNOSIS — Z981 Arthrodesis status: Secondary | ICD-10-CM

## 2021-03-25 HISTORY — PX: INCISION AND DRAINAGE OF WOUND: SHX1803

## 2021-03-25 HISTORY — PX: APPLICATION OF WOUND VAC: SHX5189

## 2021-03-25 LAB — COMPREHENSIVE METABOLIC PANEL
ALT: 19 U/L (ref 0–44)
AST: 17 U/L (ref 15–41)
Albumin: 3.6 g/dL (ref 3.5–5.0)
Alkaline Phosphatase: 216 U/L — ABNORMAL HIGH (ref 38–126)
Anion gap: 8 (ref 5–15)
BUN: 17 mg/dL (ref 8–23)
CO2: 23 mmol/L (ref 22–32)
Calcium: 9.9 mg/dL (ref 8.9–10.3)
Chloride: 109 mmol/L (ref 98–111)
Creatinine, Ser: 1.73 mg/dL — ABNORMAL HIGH (ref 0.44–1.00)
GFR, Estimated: 32 mL/min — ABNORMAL LOW (ref >60)
Glucose, Bld: 95 mg/dL (ref 70–99)
Potassium: 3.9 mmol/L (ref 3.5–5.1)
Sodium: 140 mmol/L (ref 135–145)
Total Bilirubin: 0.4 mg/dL (ref 0.3–1.2)
Total Protein: 7.6 g/dL (ref 6.5–8.1)

## 2021-03-25 LAB — CBC WITH DIFFERENTIAL/PLATELET
Abs Immature Granulocytes: 0.01 10*3/uL (ref 0.00–0.07)
Basophils Absolute: 0 10*3/uL (ref 0.0–0.1)
Basophils Relative: 1 %
Eosinophils Absolute: 0.2 10*3/uL (ref 0.0–0.5)
Eosinophils Relative: 3 %
HCT: 31.3 % — ABNORMAL LOW (ref 36.0–46.0)
Hemoglobin: 10 g/dL — ABNORMAL LOW (ref 12.0–15.0)
Immature Granulocytes: 0 %
Lymphocytes Relative: 24 %
Lymphs Abs: 1.4 10*3/uL (ref 0.7–4.0)
MCH: 28.2 pg (ref 26.0–34.0)
MCHC: 31.9 g/dL (ref 30.0–36.0)
MCV: 88.2 fL (ref 80.0–100.0)
Monocytes Absolute: 0.6 10*3/uL (ref 0.1–1.0)
Monocytes Relative: 10 %
Neutro Abs: 3.7 10*3/uL (ref 1.7–7.7)
Neutrophils Relative %: 62 %
Platelets: 627 10*3/uL — ABNORMAL HIGH (ref 150–400)
RBC: 3.55 MIL/uL — ABNORMAL LOW (ref 3.87–5.11)
RDW: 14.4 % (ref 11.5–15.5)
WBC: 5.8 10*3/uL (ref 4.0–10.5)
nRBC: 0 % (ref 0.0–0.2)

## 2021-03-25 LAB — SARS CORONAVIRUS 2 BY RT PCR (HOSPITAL ORDER, PERFORMED IN ~~LOC~~ HOSPITAL LAB): SARS Coronavirus 2: NEGATIVE

## 2021-03-25 LAB — URINALYSIS, ROUTINE W REFLEX MICROSCOPIC
Bilirubin Urine: NEGATIVE
Glucose, UA: NEGATIVE mg/dL
Hgb urine dipstick: NEGATIVE
Ketones, ur: NEGATIVE mg/dL
Leukocytes,Ua: NEGATIVE
Nitrite: NEGATIVE
Protein, ur: NEGATIVE mg/dL
Specific Gravity, Urine: 1.014 (ref 1.005–1.030)
pH: 6 (ref 5.0–8.0)

## 2021-03-25 LAB — SURGICAL PCR SCREEN
MRSA, PCR: NEGATIVE
Staphylococcus aureus: NEGATIVE

## 2021-03-25 SURGERY — IRRIGATION AND DEBRIDEMENT WOUND
Anesthesia: General | Site: Back

## 2021-03-25 MED ORDER — TOPIRAMATE 25 MG PO TABS
25.0000 mg | ORAL_TABLET | Freq: Every day | ORAL | Status: DC
  Administered 2021-03-25 – 2021-04-02 (×8): 25 mg via ORAL
  Filled 2021-03-25 (×11): qty 1

## 2021-03-25 MED ORDER — CEFAZOLIN SODIUM-DEXTROSE 2-3 GM-%(50ML) IV SOLR
INTRAVENOUS | Status: DC | PRN
  Administered 2021-03-25: 2 g via INTRAVENOUS

## 2021-03-25 MED ORDER — SODIUM CHLORIDE 0.9 % IV SOLN: INTRAVENOUS | Status: DC

## 2021-03-25 MED ORDER — OXYCODONE HCL 5 MG/5ML PO SOLN: 5.0000 mg | Freq: Once | ORAL | Status: DC | PRN

## 2021-03-25 MED ORDER — ACETAMINOPHEN 650 MG RE SUPP: 650.0000 mg | RECTAL | Status: DC | PRN

## 2021-03-25 MED ORDER — LACTATED RINGERS IV SOLN: INTRAVENOUS | Status: DC

## 2021-03-25 MED ORDER — 0.9 % SODIUM CHLORIDE (POUR BTL) OPTIME
TOPICAL | Status: DC | PRN
Start: 2021-03-25 — End: 2021-03-25
  Administered 2021-03-25: 1000 mL

## 2021-03-25 MED ORDER — DEXAMETHASONE SODIUM PHOSPHATE 10 MG/ML IJ SOLN
INTRAMUSCULAR | Status: DC | PRN
Start: 2021-03-25 — End: 2021-03-25
  Administered 2021-03-25: 5 mg via INTRAVENOUS

## 2021-03-25 MED ORDER — DEXAMETHASONE SODIUM PHOSPHATE 10 MG/ML IJ SOLN
INTRAMUSCULAR | Status: AC
  Filled 2021-03-25: qty 1

## 2021-03-25 MED ORDER — CEFAZOLIN SODIUM-DEXTROSE 1-4 GM/50ML-% IV SOLN
1.0000 g | Freq: Three times a day (TID) | INTRAVENOUS | Status: DC
  Administered 2021-03-25 – 2021-03-27 (×4): 1 g via INTRAVENOUS
  Filled 2021-03-25 (×5): qty 50

## 2021-03-25 MED ORDER — FENTANYL CITRATE (PF) 100 MCG/2ML IJ SOLN
25.0000 ug | INTRAMUSCULAR | Status: DC | PRN
  Administered 2021-03-25 (×2): 50 ug via INTRAVENOUS

## 2021-03-25 MED ORDER — ACETAMINOPHEN 160 MG/5ML PO SOLN: 325.0000 mg | ORAL | Status: DC | PRN

## 2021-03-25 MED ORDER — ROCURONIUM BROMIDE 10 MG/ML (PF) SYRINGE
PREFILLED_SYRINGE | INTRAVENOUS | Status: AC
  Filled 2021-03-25: qty 10

## 2021-03-25 MED ORDER — PROPOFOL 10 MG/ML IV BOLUS
INTRAVENOUS | Status: DC | PRN
Start: 2021-03-25 — End: 2021-03-25
  Administered 2021-03-25: 160 mg via INTRAVENOUS

## 2021-03-25 MED ORDER — ACETAMINOPHEN 325 MG PO TABS: 325.0000 mg | ORAL_TABLET | ORAL | Status: DC | PRN

## 2021-03-25 MED ORDER — PHENOL 1.4 % MT LIQD
1.0000 | OROMUCOSAL | Status: DC | PRN
Start: 2021-03-25 — End: 2021-04-06

## 2021-03-25 MED ORDER — ORAL CARE MOUTH RINSE: 15.0000 mL | Freq: Once | OROMUCOSAL | Status: AC

## 2021-03-25 MED ORDER — LORATADINE 10 MG PO TABS: 10.0000 mg | ORAL_TABLET | Freq: Every day | ORAL | Status: DC | PRN

## 2021-03-25 MED ORDER — HYDROCODONE-ACETAMINOPHEN 7.5-325 MG PO TABS
1.0000 | ORAL_TABLET | Freq: Four times a day (QID) | ORAL | Status: DC | PRN
  Administered 2021-03-25 – 2021-03-26 (×3): 2 via ORAL
  Administered 2021-03-27 (×2): 1 via ORAL
  Administered 2021-03-27 – 2021-03-30 (×7): 2 via ORAL
  Administered 2021-03-31: 1 via ORAL
  Administered 2021-03-31 – 2021-04-06 (×17): 2 via ORAL
  Filled 2021-03-25 (×5): qty 2
  Filled 2021-03-25: qty 1
  Filled 2021-03-25 (×7): qty 2
  Filled 2021-03-25: qty 1
  Filled 2021-03-25 (×17): qty 2

## 2021-03-25 MED ORDER — LIDOCAINE 2% (20 MG/ML) 5 ML SYRINGE
INTRAMUSCULAR | Status: AC
  Filled 2021-03-25: qty 5

## 2021-03-25 MED ORDER — FLUTICASONE PROPIONATE 50 MCG/ACT NA SUSP
1.0000 | Freq: Every day | NASAL | Status: DC | PRN
  Filled 2021-03-25: qty 16

## 2021-03-25 MED ORDER — FENTANYL CITRATE (PF) 100 MCG/2ML IJ SOLN
INTRAMUSCULAR | Status: AC
  Filled 2021-03-25: qty 2

## 2021-03-25 MED ORDER — MIDAZOLAM HCL 2 MG/2ML IJ SOLN
INTRAMUSCULAR | Status: AC
  Filled 2021-03-25: qty 2

## 2021-03-25 MED ORDER — ONDANSETRON HCL 4 MG/2ML IJ SOLN: 4.0000 mg | Freq: Once | INTRAMUSCULAR | Status: DC | PRN

## 2021-03-25 MED ORDER — DULOXETINE HCL 30 MG PO CPEP
30.0000 mg | ORAL_CAPSULE | Freq: Every day | ORAL | Status: DC
  Administered 2021-03-25 – 2021-04-05 (×12): 30 mg via ORAL
  Filled 2021-03-25 (×12): qty 1

## 2021-03-25 MED ORDER — FENTANYL CITRATE (PF) 250 MCG/5ML IJ SOLN
INTRAMUSCULAR | Status: AC
  Filled 2021-03-25: qty 5

## 2021-03-25 MED ORDER — ONDANSETRON HCL 4 MG PO TABS
4.0000 mg | ORAL_TABLET | Freq: Four times a day (QID) | ORAL | Status: DC | PRN
  Administered 2021-03-26 – 2021-04-06 (×26): 4 mg via ORAL
  Filled 2021-03-25 (×26): qty 1

## 2021-03-25 MED ORDER — ONDANSETRON HCL 4 MG/2ML IJ SOLN
4.0000 mg | Freq: Four times a day (QID) | INTRAMUSCULAR | Status: DC | PRN
  Administered 2021-03-25 – 2021-04-02 (×3): 4 mg via INTRAVENOUS
  Filled 2021-03-25 (×3): qty 2

## 2021-03-25 MED ORDER — ONDANSETRON HCL 4 MG/2ML IJ SOLN
INTRAMUSCULAR | Status: DC | PRN
  Administered 2021-03-25: 4 mg via INTRAVENOUS

## 2021-03-25 MED ORDER — LOSARTAN POTASSIUM 50 MG PO TABS
50.0000 mg | ORAL_TABLET | Freq: Every day | ORAL | Status: DC
Start: 2021-03-25 — End: 2021-04-06
  Administered 2021-03-25 – 2021-04-06 (×13): 50 mg via ORAL
  Filled 2021-03-25 (×13): qty 1

## 2021-03-25 MED ORDER — SODIUM CHLORIDE 0.9 % IV SOLN: 250.0000 mL | INTRAVENOUS | Status: DC

## 2021-03-25 MED ORDER — FENTANYL CITRATE (PF) 100 MCG/2ML IJ SOLN
INTRAMUSCULAR | Status: DC | PRN
Start: 2021-03-25 — End: 2021-03-25
  Administered 2021-03-25 (×3): 50 ug via INTRAVENOUS

## 2021-03-25 MED ORDER — MEPERIDINE HCL 25 MG/ML IJ SOLN: 6.2500 mg | INTRAMUSCULAR | Status: DC | PRN

## 2021-03-25 MED ORDER — LIDOCAINE 2% (20 MG/ML) 5 ML SYRINGE
INTRAMUSCULAR | Status: DC | PRN
Start: 2021-03-25 — End: 2021-03-25
  Administered 2021-03-25: 60 mg via INTRAVENOUS

## 2021-03-25 MED ORDER — POLYETHYLENE GLYCOL 3350 17 G PO PACK
17.0000 g | PACK | Freq: Every day | ORAL | Status: DC | PRN
  Administered 2021-03-26 – 2021-04-05 (×5): 17 g via ORAL
  Filled 2021-03-25 (×6): qty 1

## 2021-03-25 MED ORDER — ONDANSETRON HCL 4 MG/2ML IJ SOLN
INTRAMUSCULAR | Status: AC
  Filled 2021-03-25: qty 2

## 2021-03-25 MED ORDER — ROCURONIUM BROMIDE 10 MG/ML (PF) SYRINGE
PREFILLED_SYRINGE | INTRAVENOUS | Status: DC | PRN
  Administered 2021-03-25: 60 mg via INTRAVENOUS

## 2021-03-25 MED ORDER — SODIUM CHLORIDE 0.9% FLUSH: 3.0000 mL | INTRAVENOUS | Status: DC | PRN

## 2021-03-25 MED ORDER — DOCUSATE SODIUM 100 MG PO CAPS
100.0000 mg | ORAL_CAPSULE | Freq: Two times a day (BID) | ORAL | Status: DC
  Administered 2021-03-25 – 2021-04-06 (×21): 100 mg via ORAL
  Filled 2021-03-25 (×23): qty 1

## 2021-03-25 MED ORDER — HYDROMORPHONE HCL 1 MG/ML IJ SOLN
0.5000 mg | INTRAMUSCULAR | Status: DC | PRN
  Administered 2021-03-25 – 2021-04-04 (×32): 0.5 mg via INTRAVENOUS
  Filled 2021-03-25 (×33): qty 0.5

## 2021-03-25 MED ORDER — PREGABALIN 50 MG PO CAPS
50.0000 mg | ORAL_CAPSULE | Freq: Three times a day (TID) | ORAL | Status: DC
  Administered 2021-03-25 – 2021-04-06 (×34): 50 mg via ORAL
  Filled 2021-03-25 (×34): qty 1

## 2021-03-25 MED ORDER — ATORVASTATIN CALCIUM 10 MG PO TABS
10.0000 mg | ORAL_TABLET | Freq: Every day | ORAL | Status: DC
  Administered 2021-03-25 – 2021-04-05 (×12): 10 mg via ORAL
  Filled 2021-03-25 (×12): qty 1

## 2021-03-25 MED ORDER — CEFAZOLIN SODIUM 1 G IJ SOLR
INTRAMUSCULAR | Status: AC
  Filled 2021-03-25: qty 10

## 2021-03-25 MED ORDER — PROPOFOL 10 MG/ML IV BOLUS
INTRAVENOUS | Status: AC
  Filled 2021-03-25: qty 20

## 2021-03-25 MED ORDER — AMLODIPINE BESYLATE 5 MG PO TABS
5.0000 mg | ORAL_TABLET | Freq: Every day | ORAL | Status: DC
Start: 2021-03-26 — End: 2021-04-06
  Administered 2021-03-26 – 2021-04-06 (×12): 5 mg via ORAL
  Filled 2021-03-25 (×12): qty 1

## 2021-03-25 MED ORDER — OXYCODONE HCL 5 MG PO TABS: 5.0000 mg | ORAL_TABLET | Freq: Once | ORAL | Status: DC | PRN

## 2021-03-25 MED ORDER — CHLORHEXIDINE GLUCONATE 0.12 % MT SOLN
15.0000 mL | Freq: Once | OROMUCOSAL | Status: AC
  Administered 2021-03-25: 15 mL via OROMUCOSAL
  Filled 2021-03-25: qty 15

## 2021-03-25 MED ORDER — ALLOPURINOL 100 MG PO TABS
200.0000 mg | ORAL_TABLET | Freq: Every day | ORAL | Status: DC
  Administered 2021-03-26 – 2021-04-06 (×12): 200 mg via ORAL
  Filled 2021-03-25 (×13): qty 2

## 2021-03-25 MED ORDER — MENTHOL 3 MG MT LOZG
1.0000 | LOZENGE | OROMUCOSAL | Status: DC | PRN
  Filled 2021-03-25: qty 9

## 2021-03-25 MED ORDER — MIDAZOLAM HCL 2 MG/2ML IJ SOLN
INTRAMUSCULAR | Status: DC | PRN
  Administered 2021-03-25: 2 mg via INTRAVENOUS

## 2021-03-25 MED ORDER — SODIUM CHLORIDE 0.9% FLUSH
3.0000 mL | Freq: Two times a day (BID) | INTRAVENOUS | Status: DC
  Administered 2021-03-26 – 2021-04-06 (×19): 3 mL via INTRAVENOUS

## 2021-03-25 MED ORDER — SUGAMMADEX SODIUM 200 MG/2ML IV SOLN
INTRAVENOUS | Status: DC | PRN
  Administered 2021-03-25: 100 mg via INTRAVENOUS
  Administered 2021-03-25: 200 mg via INTRAVENOUS

## 2021-03-25 MED ORDER — ACETAMINOPHEN 325 MG PO TABS
650.0000 mg | ORAL_TABLET | ORAL | Status: DC | PRN
  Administered 2021-03-29 – 2021-04-06 (×3): 650 mg via ORAL
  Filled 2021-03-25 (×5): qty 2

## 2021-03-25 SURGICAL SUPPLY — 52 items
BAG COUNTER SPONGE SURGICOUNT (BAG) ×2 IMPLANT
CANISTER WOUNDNEG PRESSURE 500 (CANNISTER) ×1 IMPLANT
CORD BIPOLAR FORCEPS 12FT (ELECTRODE) ×1 IMPLANT
COVER SURGICAL LIGHT HANDLE (MISCELLANEOUS) ×2 IMPLANT
DRAPE HALF SHEET 40X57 (DRAPES) ×2 IMPLANT
DRAPE SURG 17X23 STRL (DRAPES) ×6 IMPLANT
DRSG VAC ATS SM SENSATRAC (GAUZE/BANDAGES/DRESSINGS) ×1 IMPLANT
DURAPREP 26ML APPLICATOR (WOUND CARE) ×2 IMPLANT
ELECT BLADE 4.0 EZ CLEAN MEGAD (MISCELLANEOUS)
ELECT CAUTERY BLADE 6.4 (BLADE) ×2 IMPLANT
ELECT REM PT RETURN 9FT ADLT (ELECTROSURGICAL) ×2
ELECTRODE BLDE 4.0 EZ CLN MEGD (MISCELLANEOUS) IMPLANT
ELECTRODE REM PT RTRN 9FT ADLT (ELECTROSURGICAL) ×1 IMPLANT
EVACUATOR 1/8 PVC DRAIN (DRAIN) IMPLANT
GAUZE SPONGE 4X4 12PLY STRL LF (GAUZE/BANDAGES/DRESSINGS) ×1 IMPLANT
GLOVE SRG 8 PF TXTR STRL LF DI (GLOVE) ×2 IMPLANT
GLOVE SURG ENC MOIS LTX SZ7.5 (GLOVE) ×4 IMPLANT
GLOVE SURG UNDER POLY LF SZ8 (GLOVE) ×2
GOWN STRL REUS W/ TWL LRG LVL3 (GOWN DISPOSABLE) ×1 IMPLANT
GOWN STRL REUS W/ TWL XL LVL3 (GOWN DISPOSABLE) ×1 IMPLANT
GOWN STRL REUS W/TWL 2XL LVL3 (GOWN DISPOSABLE) ×2 IMPLANT
GOWN STRL REUS W/TWL LRG LVL3 (GOWN DISPOSABLE) ×1
GOWN STRL REUS W/TWL XL LVL3 (GOWN DISPOSABLE) ×1
HANDPIECE INTERPULSE COAX TIP (DISPOSABLE)
KIT BASIN OR (CUSTOM PROCEDURE TRAY) ×2 IMPLANT
KIT TURNOVER KIT B (KITS) ×2 IMPLANT
MANIFOLD NEPTUNE II (INSTRUMENTS) ×2 IMPLANT
NDL SPNL 18GX3.5 QUINCKE PK (NEEDLE) ×2 IMPLANT
NEEDLE 22X1 1/2 (OR ONLY) (NEEDLE) ×2 IMPLANT
NEEDLE SPNL 18GX3.5 QUINCKE PK (NEEDLE) ×4 IMPLANT
NS IRRIG 1000ML POUR BTL (IV SOLUTION) ×2 IMPLANT
PACK LAMINECTOMY ORTHO (CUSTOM PROCEDURE TRAY) ×2 IMPLANT
PAD ABD 8X10 STRL (GAUZE/BANDAGES/DRESSINGS) ×1 IMPLANT
PAD ARMBOARD 7.5X6 YLW CONV (MISCELLANEOUS) ×4 IMPLANT
PATTIES SURGICAL .5 X1 (DISPOSABLE) IMPLANT
PENCIL BUTTON HOLSTER BLD 10FT (ELECTRODE) ×1 IMPLANT
SET HNDPC FAN SPRY TIP SCT (DISPOSABLE) IMPLANT
SPONGE SURGIFOAM ABS GEL 100 (HEMOSTASIS) IMPLANT
SUT ETHILON 2 0 FSLX (SUTURE) ×3 IMPLANT
SUT MNCRL AB 3-0 PS2 18 (SUTURE) IMPLANT
SUT VIC AB 0 CT1 27 (SUTURE)
SUT VIC AB 0 CT1 27XBRD ANBCTR (SUTURE) ×1 IMPLANT
SUT VIC AB 2-0 CT1 27 (SUTURE)
SUT VIC AB 2-0 CT1 TAPERPNT 27 (SUTURE) ×1 IMPLANT
SWAB COLLECTION DEVICE MRSA (MISCELLANEOUS) IMPLANT
SWAB CULTURE ESWAB REG 1ML (MISCELLANEOUS) IMPLANT
SYR BULB IRRIG 60ML STRL (SYRINGE) ×1 IMPLANT
SYR CONTROL 10ML LL (SYRINGE) ×2 IMPLANT
TOWEL GREEN STERILE (TOWEL DISPOSABLE) ×2 IMPLANT
TOWEL GREEN STERILE FF (TOWEL DISPOSABLE) ×2 IMPLANT
WATER STERILE IRR 1000ML POUR (IV SOLUTION) ×2 IMPLANT
YANKAUER SUCT BULB TIP NO VENT (SUCTIONS) ×2 IMPLANT

## 2021-03-25 NOTE — H&P (Signed)
[name]                                          Date of Birth: [birth year]                                                   MRN: [id number] Visit Date: 03/24/2021 PCP: Isaac Bliss, [name], MD     Assessment & Plan: Patient returns post instrumented fusion.  She lost weight in order to get her lumbar fusion is continue to diet and has had poor p.o. intake with preoperative albumin 3.5.  She states she really has not been very hungry.  Incisions continue to drain clear fluid she has a ballotable seroma at the inferior aspect of her incision and some erythema around the staples but no current cellulitis.  Currently she has a draining seroma.  I discussed the patient that I would recommend proceeding with washing this out applying Ulice Bold and she needs to increase her protein intake in order to get her incision to heal.  Better to address this early before she develops the postop infection.  Plan would be application of a VAC either small VAC versus incisional VAC depending what the tissue looks like, obtaining cultures and then using culture specific antibiotics if needed.  Today she is afebrile.  She has had some drainage on her dressing but there is no fluid expressible today for culture.  Will obtain cultures Intra-Op tomorrow.   Chief Complaint:      Chief Complaint  Patient presents with   Lower Back - Follow-up, Routine Post Op      03/09/2021 Right L4-5 TLIF, Right L5-S1 microdiscectomy and repair of dura    Visit Diagnoses:  1. Infection of superficial incisional surgical site after procedure, initial encounter   2. S/P lumbar fusion       Plan: New dressing applied plan for washout for draining seroma and VAC application if needed.   Follow-Up Instructions: No follow-ups on file.    Orders:  No orders of the defined types were placed in this encounter.   No orders of the defined types were placed in this encounter.     Imaging: No results  found.   PMFS History:     Patient Active Problem List    Diagnosis Date Noted   Post op infection 03/24/2021   S/P lumbar fusion 03/24/2021   Lumbar stenosis 03/09/2021   Degenerative spondylolisthesis 02/19/2021   Primary osteoarthritis of left knee 09/08/2020   DJD (degenerative joint disease) of knee 09/08/2020   Status post total left knee replacement 09/08/2020   Spondylosis without myelopathy or radiculopathy, lumbar region 07/17/2020   Chronic pain syndrome 07/17/2020   Myalgia 07/17/2020   OSA (obstructive sleep apnea) 05/19/2020   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/17/2020   Sleep disturbance 04/17/2020   Chronic pain of both shoulders 03/03/2020   Pain in both hands 03/03/2020   Primary osteoarthritis of both knees 03/03/2020   Pain in both feet 03/03/2020   History of chronic kidney disease 03/03/2020   DDD (degenerative disc disease), lumbar 02/05/2020   Vitamin D deficiency 01/09/2020   Hyperlipidemia 01/09/2020   CKD (chronic kidney disease) stage 3, GFR 30-59 ml/min ([facility])  01/09/2020   HTN (hypertension)     Morbid obesity ([facility])     Gout     Fibromyalgia     Depression     Insomnia          Past Medical History:  Diagnosis Date   Chronic kidney disease     Depression     Fibromyalgia     Gout     HTN (hypertension)     Insomnia     Morbid obesity ([facility])     Osteoarthritis     Palpitation     Sleep apnea           Family History  Problem Relation Age of Onset   Hypothyroidism Mother     Hyperlipidemia Other     Kidney failure Father     Dementia Father     Gout Father     Arthritis Father     Prostate cancer Father           Past Surgical History:  Procedure Laterality Date   JOINT REPLACEMENT       KNEE ARTHROSCOPY       right rotator cuff       ROTATOR CUFF REPAIR Left     TOTAL KNEE ARTHROPLASTY Left 09/08/2020    Procedure: LEFT TOTAL KNEE ARTHROPLASTY;  Surgeon: Leandrew Koyanagi, MD;  Location: [facility];  Service:  Orthopedics;  Laterality: Left;   TUBAL LIGATION        Social History         Occupational History   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

## 2021-03-25 NOTE — Progress Notes (Signed)
Orthopedic Tech Progress Note Patient Details:  Amy Hooper 11-Jul-1950 [id number] LSO Brace was given to patient in PACU Ortho Devices Type of Ortho Device: Lumbar corsett Ortho Device/Splint Interventions: Ordered      Amy Hooper Amy Hooper 03/25/2021, 5:26 PM

## 2021-03-25 NOTE — Transfer of Care (Signed)
Immediate Anesthesia Transfer of Care Note  [name]  Procedure(s) Performed: LUMBAR POST OP INCISION IRRIGATION (Back) APPLICATION OF WOUND VAC (Back)  Patient Location: PACU  Anesthesia Type:General  Level of Consciousness: awake and oriented  Airway & Oxygen Therapy: Patient Spontanous Breathing and Patient connected to nasal cannula oxygen  Post-op Assessment: Report given to RN  Post vital signs: Reviewed and stable  Last Vitals:  Vitals Value Taken Time  BP 136/74 03/25/21 1630  Temp    Pulse 85 03/25/21 1632  Resp 11 03/25/21 1632  SpO2 100 % 03/25/21 1632  Vitals shown include unvalidated device data.  Last Pain:  Vitals:   03/25/21 1346  TempSrc:   PainSc: 9       Patients Stated Pain Goal: 2 (06/98/61 4830)  Complications: No notable events documented.

## 2021-03-25 NOTE — Plan of Care (Signed)

## 2021-03-25 NOTE — Anesthesia Procedure Notes (Signed)
Procedure Name: Intubation Date/Time: 03/25/2021 3:24 PM Performed by: Barrington Ellison, CRNA Pre-anesthesia Checklist: Patient identified, Emergency Drugs available, Suction available and Patient being monitored Patient Re-evaluated:Patient Re-evaluated prior to induction Oxygen Delivery Method: Circle System Utilized Preoxygenation: Pre-oxygenation with 100% oxygen Induction Type: IV induction Ventilation: Mask ventilation without difficulty Laryngoscope Size: Mac and 3 Grade View: Grade I Tube type: Oral Tube size: 7.0 mm Number of attempts: 1 Airway Equipment and Method: Stylet and Oral airway Placement Confirmation: ETT inserted through vocal cords under direct vision, positive ETCO2 and breath sounds checked- equal and bilateral Secured at: 21 cm Tube secured with: Tape Dental Injury: Teeth and Oropharynx as per pre-operative assessment

## 2021-03-25 NOTE — Op Note (Signed)
Postop diagnosis: Post lumbar instrumented fusion with superficial seroma/possible infection.  Postop diagnosis: Same  Procedure: Lumbar postoperative incision irrigation and excisional debridement.  Reclosure and VAC placement.  Surgeon: Lorin Mercy MD  Assistant: Benjiman Core, PA-C  Anesthesia General oral tracheal.  Brief history 71 year old female with previous morbid obesity and neurogenic claudication severe spinal stenosis lost 40-50+ pounds that she could get her BMI below 40 so she could have lumbar fusion.  Postoperatively with pain medication she has had continued poor appetite and seroma puffiness of the incision.  Thick adipose layer of the lumbar region above the fascia and patient had some serous drainage slightly Cloudy without fever or cellulitis.  She is brought back in for washout and VAC placement before she develops significant infection.  Patient was afebrile and admission white count was 5800 with normal differential.  Patient is now 3 weeks postop.  Procedure: After induction general esthesia prone position no antibiotics timeout procedure prepping with DuraPrep after 1015 drape was applied.  Area squared with towels and Betadine Steri-Drape stuff around the edges.  Timeout procedure completed and staples removed with a hemostat.  We spread that the distal aspect there was cloudy with some blood which could represent infected fluid but definitely not typical purulent material.  No odor was noted.  Immediately got aerobic and anaerobic cultures and then IV Ancef was given by anesthesia team after reviewing her allergies.  Subtendinous sutures were cut extending down to the fascia and there is no necrotic tissue.  Using pickups and scalpel I excised some of the fat and any questionable areas used a curette as well as pickups and scissors for sharp excisional debridement of some subcutaneous fat areas.  Cautery was used for any bleeding.  Copious irrigation was then performed and then 2-0  nylon starting with proximal extending toward the distal using a far near near far suture with 2-0 nylon.  An area of 6 to 7 cm was left open and small VAC sponge black was stuck down into it and then some of the sponge extended up over the suture area proximally and distally and then sealed with the VAC dressing.  There was low leak rate.  Plan will be follow cultures and antibiotic treatment based on culture findings.  She will need to have the VAC to suck down adipose tissue down to the fascia and close the dead space.  Patient be in the hospital for least 2 days pending cultures and then we can make a determination if she needs a PICC line for home IV antibiotics or she can be treated orally.  We will see with gram stain and initial cultures show and then can make a decision about infectious disease consultation to help [place] with following all cultures.

## 2021-03-25 NOTE — Plan of Care (Signed)

## 2021-03-25 NOTE — Anesthesia Preprocedure Evaluation (Addendum)
Anesthesia Evaluation  Patient identified by MRN, date of birth, ID band Patient awake    Reviewed: Allergy & Precautions, NPO status , Patient's Chart, lab work & pertinent test results  Airway Mallampati: I       Dental no notable dental hx. (+) Edentulous Upper, Partial Lower, Poor Dentition, Missing,    Pulmonary sleep apnea and Continuous Positive Airway Pressure Ventilation ,    Pulmonary exam normal        Cardiovascular hypertension, Pt. on medications Normal cardiovascular exam  EKG 09/02/20 NSR, normal   Neuro/Psych PSYCHIATRIC DISORDERS Depression  Neuromuscular disease    GI/Hepatic negative GI ROS, Neg liver ROS,   Endo/Other  Morbid obesityHyperlipidemia  Renal/GU Renal InsufficiencyRenal disease  negative genitourinary   Musculoskeletal  (+) Arthritis , Osteoarthritis,  Fibromyalgia -OA both knees   Abdominal (+) + obese,   Peds  Hematology negative hematology ROS (+)   Anesthesia Other Findings   Reproductive/Obstetrics                            Anesthesia Physical  Anesthesia Plan  ASA: 3  Anesthesia Plan: General   Post-op Pain Management:    Induction: Intravenous  PONV Risk Score and Plan: 3 and 4 or greater and Treatment may vary due to age or medical condition, Propofol infusion, Ondansetron and Midazolam  Airway Management Planned: Oral ETT  Additional Equipment: None  Intra-op Plan:   Post-operative Plan: Extubation in OR  Informed Consent: I have reviewed the patients History and Physical, chart, labs and discussed the procedure including the risks, benefits and alternatives for the proposed anesthesia with the patient or authorized representative who has indicated his/her understanding and acceptance.     Dental advisory given  Plan Discussed with: CRNA and Anesthesiologist  Anesthesia Plan Comments: (  )        Anesthesia Quick  Evaluation

## 2021-03-26 ENCOUNTER — Encounter (HOSPITAL_COMMUNITY): Payer: Self-pay | Admitting: Orthopaedic Surgery

## 2021-03-26 DIAGNOSIS — G4733 Obstructive sleep apnea (adult) (pediatric): Secondary | ICD-10-CM | POA: Diagnosis present

## 2021-03-26 DIAGNOSIS — L7682 Other postprocedural complications of skin and subcutaneous tissue: Secondary | ICD-10-CM | POA: Diagnosis not present

## 2021-03-26 DIAGNOSIS — J029 Acute pharyngitis, unspecified: Secondary | ICD-10-CM | POA: Diagnosis not present

## 2021-03-26 DIAGNOSIS — F32A Depression, unspecified: Secondary | ICD-10-CM | POA: Diagnosis present

## 2021-03-26 DIAGNOSIS — T8141XA Infection following a procedure, superficial incisional surgical site, initial encounter: Secondary | ICD-10-CM | POA: Diagnosis present

## 2021-03-26 DIAGNOSIS — Z841 Family history of disorders of kidney and ureter: Secondary | ICD-10-CM | POA: Diagnosis not present

## 2021-03-26 DIAGNOSIS — Z79899 Other long term (current) drug therapy: Secondary | ICD-10-CM | POA: Diagnosis not present

## 2021-03-26 DIAGNOSIS — G894 Chronic pain syndrome: Secondary | ICD-10-CM | POA: Diagnosis present

## 2021-03-26 DIAGNOSIS — Z981 Arthrodesis status: Secondary | ICD-10-CM | POA: Diagnosis not present

## 2021-03-26 DIAGNOSIS — Z885 Allergy status to narcotic agent status: Secondary | ICD-10-CM | POA: Diagnosis not present

## 2021-03-26 DIAGNOSIS — Z8261 Family history of arthritis: Secondary | ICD-10-CM | POA: Diagnosis not present

## 2021-03-26 DIAGNOSIS — Z96652 Presence of left artificial knee joint: Secondary | ICD-10-CM | POA: Diagnosis present

## 2021-03-26 DIAGNOSIS — M797 Fibromyalgia: Secondary | ICD-10-CM | POA: Diagnosis present

## 2021-03-26 DIAGNOSIS — I129 Hypertensive chronic kidney disease with stage 1 through stage 4 chronic kidney disease, or unspecified chronic kidney disease: Secondary | ICD-10-CM | POA: Diagnosis present

## 2021-03-26 DIAGNOSIS — G4700 Insomnia, unspecified: Secondary | ICD-10-CM | POA: Diagnosis present

## 2021-03-26 DIAGNOSIS — T8142XA Infection following a procedure, deep incisional surgical site, initial encounter: Secondary | ICD-10-CM | POA: Diagnosis not present

## 2021-03-26 DIAGNOSIS — N183 Chronic kidney disease, stage 3 unspecified: Secondary | ICD-10-CM | POA: Diagnosis present

## 2021-03-26 DIAGNOSIS — Z881 Allergy status to other antibiotic agents status: Secondary | ICD-10-CM | POA: Diagnosis not present

## 2021-03-26 DIAGNOSIS — E785 Hyperlipidemia, unspecified: Secondary | ICD-10-CM | POA: Diagnosis present

## 2021-03-26 DIAGNOSIS — N289 Disorder of kidney and ureter, unspecified: Secondary | ICD-10-CM | POA: Diagnosis not present

## 2021-03-26 DIAGNOSIS — Z20822 Contact with and (suspected) exposure to covid-19: Secondary | ICD-10-CM | POA: Diagnosis present

## 2021-03-26 DIAGNOSIS — G9763 Postprocedural seroma of a nervous system organ or structure following a nervous system procedure: Secondary | ICD-10-CM | POA: Diagnosis present

## 2021-03-26 DIAGNOSIS — Z8042 Family history of malignant neoplasm of prostate: Secondary | ICD-10-CM | POA: Diagnosis not present

## 2021-03-26 DIAGNOSIS — T8189XA Other complications of procedures, not elsewhere classified, initial encounter: Secondary | ICD-10-CM | POA: Diagnosis present

## 2021-03-26 DIAGNOSIS — M109 Gout, unspecified: Secondary | ICD-10-CM | POA: Diagnosis present

## 2021-03-26 DIAGNOSIS — E559 Vitamin D deficiency, unspecified: Secondary | ICD-10-CM | POA: Diagnosis present

## 2021-03-26 LAB — CBC
HCT: 27.3 % — ABNORMAL LOW (ref 36.0–46.0)
Hemoglobin: 9 g/dL — ABNORMAL LOW (ref 12.0–15.0)
MCH: 28.3 pg (ref 26.0–34.0)
MCHC: 33 g/dL (ref 30.0–36.0)
MCV: 85.8 fL (ref 80.0–100.0)
Platelets: 585 10*3/uL — ABNORMAL HIGH (ref 150–400)
RBC: 3.18 MIL/uL — ABNORMAL LOW (ref 3.87–5.11)
RDW: 14.3 % (ref 11.5–15.5)
WBC: 6.6 10*3/uL (ref 4.0–10.5)
nRBC: 0 % (ref 0.0–0.2)

## 2021-03-26 LAB — BASIC METABOLIC PANEL
Anion gap: 7 (ref 5–15)
BUN: 20 mg/dL (ref 8–23)
CO2: 25 mmol/L (ref 22–32)
Calcium: 9.5 mg/dL (ref 8.9–10.3)
Chloride: 107 mmol/L (ref 98–111)
Creatinine, Ser: 1.78 mg/dL — ABNORMAL HIGH (ref 0.44–1.00)
GFR, Estimated: 31 mL/min — ABNORMAL LOW (ref >60)
Glucose, Bld: 174 mg/dL — ABNORMAL HIGH (ref 70–99)
Potassium: 4.9 mmol/L (ref 3.5–5.1)
Sodium: 139 mmol/L (ref 135–145)

## 2021-03-26 NOTE — Progress Notes (Signed)
Patient has home unit CPAP at beside. Patient needs no assistance in placing on mask.

## 2021-03-26 NOTE — Progress Notes (Signed)
° °  Subjective: 1 Day Post-Op Procedure(s) (LRB): LUMBAR POST OP INCISION IRRIGATION (N/A) APPLICATION OF WOUND VAC (N/A) Patient reports pain as mild.    Objective: Vital signs in last 24 hours: Temp:  [97.6 F (36.4 C)-99.1 F (37.3 C)] 99.1 F (37.3 C) (01/05 0744) Pulse Rate:  [64-88] 82 (01/05 0744) Resp:  [13-20] 18 (01/05 0744) BP: (126-155)/(57-86) 146/66 (01/05 0744) SpO2:  [94 %-100 %] 98 % (01/05 0744) Weight:  [95.3 kg] 95.3 kg (01/04 1331)  Intake/Output from previous day: 01/04 0701 - 01/05 0700 In: 1488.6 [I.V.:1438.6; IV Piggyback:50] Out: 20 [Blood:20] Intake/Output this shift: No intake/output data recorded.  Recent Labs    03/25/21 1345 03/26/21 0352  HGB 10.0* 9.0*   Recent Labs    03/25/21 1345 03/26/21 0352  WBC 5.8 6.6  RBC 3.55* 3.18*  HCT 31.3* 27.3*  PLT 627* 585*   Recent Labs    03/25/21 1345 03/26/21 0352  NA 140 139  K 3.9 4.9  CL 109 107  CO2 23 25  BUN 17 20  CREATININE 1.73* 1.78*  GLUCOSE 95 174*  CALCIUM 9.9 9.5   No results for input(s): LABPT, INR in the last 72 hours.  Neurologically intact No results found.  Assessment/Plan: 1 Day Post-Op Procedure(s) (LRB): LUMBAR POST OP INCISION IRRIGATION (N/A) APPLICATION OF WOUND VAC (N/A) Plan continue ABX pending cultures.    [name] 03/26/2021, 7:49 AM

## 2021-03-26 NOTE — Evaluation (Signed)
Physical Therapy Evaluation Patient Details Name: Amy Hooper MRN: [id number] DOB: [birth year] Today's Date: 03/26/2021  History of Present Illness  71 yo female with draining seroma at recent lumbar surgical site, s/p lumbar postoperative incision I &D with application of wound vac 1/4. PMH includes s/p L4-5 TLIF, R L5-S1 microdiscectomy 12/19; CKD, Fibromyalgia, depression, Gout; HTN, L TKR, L TRC repair, R RTC injury.  Clinical Impression   Pt presents with baseline activity tolerance, strength, and mobility, pt's biggest deficit currently is post-operative pain. Pt ambulated great hallway distance with SPC, overall is mod I for mobility and has a great understanding of back precautions. Pt with further acute or follow up PT needs, will sign off, all education completed.          Recommendations for follow up therapy are one component of a multi-disciplinary discharge planning process, led by the attending physician.  Recommendations may be updated based on patient status, additional functional criteria and insurance authorization.  Follow Up Recommendations No PT follow up    Assistance Recommended at Discharge PRN  Patient can return home with the following       Equipment Recommendations None recommended by PT  Recommendations for Other Services       Functional Status Assessment Patient has not had a recent decline in their functional status     Precautions / Restrictions Precautions Precautions: Back Precaution Booklet Issued: Yes (comment) Required Braces or Orthoses: Spinal Brace Spinal Brace: Lumbar corset Restrictions Weight Bearing Restrictions: No      Mobility  Bed Mobility Overal bed mobility: Modified Independent             General bed mobility comments: up in chair    Transfers Overall transfer level: Modified independent Equipment used: None Transfers: Sit to/from Stand Sit to Stand: Modified independent (Device/Increase time)            General transfer comment: increased time, no physical assist. Good hand placement when rising/sitting    Ambulation/Gait Ambulation/Gait assistance: Modified independent (Device/Increase time) Gait Distance (Feet): 300 Feet Assistive device: Straight cane Gait Pattern/deviations: Step-through pattern;WFL(Within Functional Limits)       General Gait Details: mod I for increased time, great sequencing and use of cane  Stairs Stairs:  (pt declines need for practice)          Wheelchair Mobility    Modified Rankin (Stroke Patients Only)       Balance Overall balance assessment: No apparent balance deficits (not formally assessed)                                           Pertinent Vitals/Pain Pain Assessment: 0-10 Pain Score: 3  Pain Descriptors / Indicators: Aching;Dull Pain Intervention(s): Limited activity within patient's tolerance;Monitored during session    Home Living Family/patient expects to be discharged to:: Private residence Living Arrangements: Parent;Spouse/significant other;Other relatives;Children Available Help at Discharge: Family;Available 24 hours/day Type of Home: House Home Access: Stairs to enter Entrance Stairs-Rails: Psychiatric nurse of Steps: 6   Home Layout: One level Home Equipment: BSC/3in1;Grab bars - tub/shower;Rolling Walker (2 wheels);Cane - single point;Adaptive equipment;Hand held shower head      Prior Function Prior Level of Function : Driving;Independent/Modified Independent             Mobility Comments: pt reports using cane PTA ADLs Comments: Using hip kit for LB ADLs post back  sx. Prior to sx, pt very independent.     Hand Dominance   Dominant Hand: Right    Extremity/Trunk Assessment   Upper Extremity Assessment Upper Extremity Assessment: Defer to OT evaluation    Lower Extremity Assessment Lower Extremity Assessment: Overall WFL for tasks assessed     Cervical / Trunk Assessment Cervical / Trunk Assessment: Back Surgery  Communication   Communication: No difficulties  Cognition Arousal/Alertness: Awake/alert Behavior During Therapy: WFL for tasks assessed/performed Overall Cognitive Status: Within Functional Limits for tasks assessed                                          General Comments      Exercises     Assessment/Plan    PT Assessment Patient does not need any further PT services  PT Problem List Decreased activity tolerance;Decreased mobility;Decreased knowledge of precautions;Pain       PT Treatment Interventions  (n/a)    PT Goals (Current goals can be found in the Care Plan section)  Acute Rehab PT Goals Patient Stated Goal: to return home to independent PT Goal Formulation: With patient Time For Goal Achievement: 03/26/21 Potential to Achieve Goals: Good    Frequency  (n/a)     Co-evaluation               AM-PAC PT "6 Clicks" Mobility  Outcome Measure Help needed turning from your back to your side while in a flat bed without using bedrails?: None Help needed moving from lying on your back to sitting on the side of a flat bed without using bedrails?: None Help needed moving to and from a bed to a chair (including a wheelchair)?: None Help needed standing up from a chair using your arms (e.g., wheelchair or bedside chair)?: None Help needed to walk in hospital room?: None Help needed climbing 3-5 steps with a railing? : None 6 Click Score: 24    End of Session Equipment Utilized During Treatment: Back brace Activity Tolerance: Patient tolerated treatment well Patient left: in chair;with call bell/phone within reach Nurse Communication: Mobility status PT Visit Diagnosis: Other abnormalities of gait and mobility (R26.89)    Time: 1194-1740 PT Time Calculation (min) (ACUTE ONLY): 16 min   Charges:   PT Evaluation $PT Eval Low Complexity: 1 Low         Elisheba Mcdonnell S, PT  DPT Acute Rehabilitation Services Pager [phone]  Office [phone]   Roxine Caddy E Ruffin Pyo 03/26/2021, 2:59 PM

## 2021-03-26 NOTE — Consult Note (Addendum)
WOC consult requested to change Vac dressing to back tomorrow; we will perform and call Dr Lorin Mercy to be present for first post-op dressing change as requested in the AM. Julien Girt MSN, RN, [place], [place], [place]

## 2021-03-26 NOTE — Plan of Care (Signed)

## 2021-03-26 NOTE — Anesthesia Postprocedure Evaluation (Signed)
Anesthesia Post Note  [name]  Procedure(s) Performed: LUMBAR POST OP INCISION IRRIGATION (Back) APPLICATION OF WOUND VAC (Back)     Patient location during evaluation: PACU Anesthesia Type: General Level of consciousness: awake and alert Pain management: pain level controlled Vital Signs Assessment: post-procedure vital signs reviewed and stable Respiratory status: spontaneous breathing, nonlabored ventilation, respiratory function stable and patient connected to nasal cannula oxygen Cardiovascular status: blood pressure returned to baseline and stable Postop Assessment: no apparent nausea or vomiting Anesthetic complications: no   No notable events documented.  Last Vitals:  Vitals:   03/25/21 1715 03/25/21 2152  BP: (!) 155/86 (!) 132/57  Pulse: 68 77  Resp: 15 17  Temp: 36.4 C 36.7 C  SpO2: 99% 96%    Last Pain:  Vitals:   03/25/21 2357  TempSrc:   PainSc: 10-Worst pain ever                 Dilia Alemany

## 2021-03-26 NOTE — Evaluation (Signed)
Occupational Therapy Evaluation Patient Details Name: Amy Hooper MRN: [id number] DOB: August 24, 1950 Today's Date: 03/26/2021   History of Present Illness 71 yo female with draining seroma at recent lumbar surgical site, s/p lumbar postoperative incision I &D with application of wound vac 1/4. PMH includes s/p L4-5 TLIF, R L5-S1 microdiscectomy 12/19; CKD, Fibromyalgia, depression, Gout; HTN, L TKR, L TRC repair, R RTC injury.   Clinical Impression   PTA, pt was living with her husband and was independent; using cane for mobility and AE for LB ADLs. Currently, pt performing ADLs and functional mobility using RW at Mod I level. Provided education and handout on back precautions, brace management, bed mobility, LB ADLs, toileting, and shower transfer; pt demonstrated understanding. Answered all pt questions. Recommend dc home once medically stable per physician. All acute OT needs met and will sign off. Thank you.      Recommendations for follow up therapy are one component of a multi-disciplinary discharge planning process, led by the attending physician.  Recommendations may be updated based on patient status, additional functional criteria and insurance authorization.   Follow Up Recommendations  No OT follow up    Assistance Recommended at Discharge Intermittent Supervision/Assistance  Patient can return home with the following A little help with bathing/dressing/bathroom;Assist for transportation    Functional Status Assessment  Patient has had a recent decline in their functional status and demonstrates the ability to make significant improvements in function in a reasonable and predictable amount of time.  Equipment Recommendations  None recommended by OT    Recommendations for Other Services PT consult     Precautions / Restrictions Precautions Precautions: Back Precaution Booklet Issued: Yes (comment) Required Braces or Orthoses: Spinal Brace Spinal Brace: Lumbar corset       Mobility Bed Mobility Overal bed mobility: Modified Independent Bed Mobility: Rolling;Sidelying to Sit           General bed mobility comments: Increased time    Transfers Overall transfer level: Modified independent                 General transfer comment: increased time      Balance Overall balance assessment: No apparent balance deficits (not formally assessed)                                         ADL either performed or assessed with clinical judgement   ADL Overall ADL's : Modified independent                                       General ADL Comments: Pt able to perform toileting, grooming, and functional mobiltiy at Mod I level. Pt using RW for mobility; however able to complete mobility in bathroom without UE support and RW. Pt reporting she uses reacher and sock aide at home for LB ADLs and verablized udnerstanding. Husband also very helpful.     Vision Baseline Vision/History: 1 Wears glasses Patient Visual Report: No change from baseline       Perception     Praxis      Pertinent Vitals/Pain Pain Assessment: 0-10 Pain Score: 3  Breathing: normal Negative Vocalization: none Pain Location: back Pain Descriptors / Indicators: Aching;Dull Pain Intervention(s): Limited activity within patient's tolerance;Monitored during session;Repositioned     Hand Dominance Right  Extremity/Trunk Assessment Upper Extremity Assessment Upper Extremity Assessment: Generalized weakness   Lower Extremity Assessment Lower Extremity Assessment: Defer to PT evaluation   Cervical / Trunk Assessment Cervical / Trunk Assessment: Back Surgery (s/p lumbar postoperative incision I &D with application of wound vac 1/4.)   Communication Communication Communication: No difficulties   Cognition Arousal/Alertness: Awake/alert Behavior During Therapy: WFL for tasks assessed/performed Overall Cognitive Status: Within  Functional Limits for tasks assessed                                       General Comments  Reviewing back precautions    Exercises     Shoulder Instructions      Home Living Family/patient expects to be discharged to:: Private residence Living Arrangements: Parent;Spouse/significant other;Other relatives;Children Available Help at Discharge: Family;Available 24 hours/day Type of Home: House Home Access: Stairs to enter [employer] of Steps: 6 Entrance Stairs-Rails: Right;Left Home Layout: One level     Bathroom Shower/Tub: Tub/shower unit;Walk-in shower   Bathroom Toilet: Standard     Home Equipment: BSC/3in1;Grab bars - tub/shower;Rolling Environmental consultant (2 wheels);Cane - single point;Adaptive equipment;Hand held shower head Adaptive Equipment: Reacher;Sock aid;Other (Comment) (toilet aide)        Prior Functioning/Environment Prior Level of Function : Driving;Independent/Modified Independent             Mobility Comments: pt reports using cane PTA ADLs Comments: Using hip kit for LB ADLs post back sx. Prior to sx, pt very independent.        OT Problem List: Decreased strength;Decreased range of motion;Decreased activity tolerance;Impaired balance (sitting and/or standing);Decreased knowledge of use of DME or AE;Decreased knowledge of precautions      OT Treatment/Interventions:      OT Goals(Current goals can be found in the care plan section) Acute Rehab OT Goals Patient Stated Goal: Return home OT Goal Formulation: All assessment and education complete, DC therapy  OT Frequency:      Co-evaluation              AM-PAC OT "6 Clicks" Daily Activity     Outcome Measure Help from another person eating meals?: None Help from another person taking care of personal grooming?: None Help from another person toileting, which includes using toliet, bedpan, or urinal?: None Help from another person bathing (including washing, rinsing,  drying)?: A Little Help from another person to put on and taking off regular upper body clothing?: None Help from another person to put on and taking off regular lower body clothing?: A Little 6 Click Score: 22   End of Session Equipment Utilized During Treatment: Back brace;Rolling walker (2 wheels) Nurse Communication: Mobility status  Activity Tolerance: Patient tolerated treatment well Patient left: in chair;with call bell/phone within reach  OT Visit Diagnosis: Other abnormalities of gait and mobility (R26.89);Unsteadiness on feet (R26.81);Muscle weakness (generalized) (M62.81) Pain - part of body:  (back)                Time: 7035-0093 OT Time Calculation (min): 28 min Charges:  OT General Charges $OT Visit: 1 Visit OT Evaluation $OT Eval Moderate Complexity: 1 Mod OT Treatments $Self Care/Home Management : 8-22 mins  Marthella Osorno MSOT, OTR/L Acute Rehab Pager: [phone] Office: [place] 03/26/2021, 10:49 AM

## 2021-03-27 DIAGNOSIS — L7682 Other postprocedural complications of skin and subcutaneous tissue: Secondary | ICD-10-CM | POA: Diagnosis not present

## 2021-03-27 DIAGNOSIS — T8142XA Infection following a procedure, deep incisional surgical site, initial encounter: Secondary | ICD-10-CM

## 2021-03-27 DIAGNOSIS — N189 Chronic kidney disease, unspecified: Secondary | ICD-10-CM

## 2021-03-27 DIAGNOSIS — A498 Other bacterial infections of unspecified site: Secondary | ICD-10-CM

## 2021-03-27 DIAGNOSIS — N289 Disorder of kidney and ureter, unspecified: Secondary | ICD-10-CM

## 2021-03-27 LAB — BASIC METABOLIC PANEL
Anion gap: 9 (ref 5–15)
BUN: 24 mg/dL — ABNORMAL HIGH (ref 8–23)
CO2: 25 mmol/L (ref 22–32)
Calcium: 9.3 mg/dL (ref 8.9–10.3)
Chloride: 106 mmol/L (ref 98–111)
Creatinine, Ser: 1.85 mg/dL — ABNORMAL HIGH (ref 0.44–1.00)
GFR, Estimated: 29 mL/min — ABNORMAL LOW (ref >60)
Glucose, Bld: 112 mg/dL — ABNORMAL HIGH (ref 70–99)
Potassium: 4 mmol/L (ref 3.5–5.1)
Sodium: 140 mmol/L (ref 135–145)

## 2021-03-27 LAB — CBC
HCT: 25.8 % — ABNORMAL LOW (ref 36.0–46.0)
Hemoglobin: 8.4 g/dL — ABNORMAL LOW (ref 12.0–15.0)
MCH: 28.4 pg (ref 26.0–34.0)
MCHC: 32.6 g/dL (ref 30.0–36.0)
MCV: 87.2 fL (ref 80.0–100.0)
Platelets: 559 10*3/uL — ABNORMAL HIGH (ref 150–400)
RBC: 2.96 MIL/uL — ABNORMAL LOW (ref 3.87–5.11)
RDW: 14.6 % (ref 11.5–15.5)
WBC: 7.1 10*3/uL (ref 4.0–10.5)
nRBC: 0 % (ref 0.0–0.2)

## 2021-03-27 MED ORDER — CEFEPIME HCL 2 G IJ SOLR
2.0000 g | Freq: Two times a day (BID) | INTRAMUSCULAR | Status: DC
  Administered 2021-03-27 – 2021-04-06 (×21): 2 g via INTRAVENOUS
  Filled 2021-03-27 (×22): qty 2

## 2021-03-27 MED ORDER — TIZANIDINE HCL 4 MG PO TABS
2.0000 mg | ORAL_TABLET | Freq: Three times a day (TID) | ORAL | Status: DC | PRN
  Administered 2021-03-27 – 2021-04-04 (×17): 2 mg via ORAL
  Filled 2021-03-27 (×17): qty 1

## 2021-03-27 NOTE — Plan of Care (Signed)

## 2021-03-27 NOTE — Consult Note (Addendum)
[facility] Nurse Consult Note: Reason for Consult: Consult requested to change Vac dressing to back wound; discussed plan of care with Dr Lorin Mercy of the ortho service via phone call. Wound type: Full thickness post-op wound to middle back; red and moist with small amt bloody drainage, 4X.5X4cm, sutures well approximated above and below the wound.  Pt was medicated for pain prior to the procedure and tolerated with mod amt discomfort.  Applied barrier ring around wound edges and over sutures to attempt to maintain a seal, then one piece black foam to 190mm cont suction. Pt states she is not lying on her back related to discomfort, so I did not bridge the track pad to another location. [facility]  team will change the dressing again on Mon if patient is still in the hospital at that time. Julien Girt MSN, RN, [place], [place], [place]

## 2021-03-27 NOTE — Consult Note (Signed)
Date of Admission:  03/25/2021          Reason for Consult: Postoperative wound infection   Referring Provider: Rodell Perna, MD   Assessment:  Postoperative wound infection with Enterobacter isolated on culture Recent lumbar fusion to treat L4-5 stenosis with anterolisthesis and right L4 S1 herniated nucleus pulposis Chronic kidney disease Morbid obesity Depression  Plan:  We changed her to cefepime today I will followup culture data and hopefully there is going to be an oral antibiotic we can treat her with Will screen for HIV and viral hepatitides since I do not see documentation of this in the chart  I will follow-up on her culture data this weekend and I am available for questions.  Principal Problem:   Postoperative complication of skin involving drainage from surgical wound Active Problems:   Acute on chronic renal insufficiency   Scheduled Meds:  allopurinol  200 mg Oral Daily   amLODipine  5 mg Oral Daily   atorvastatin  10 mg Oral QHS   docusate sodium  100 mg Oral BID   DULoxetine  30 mg Oral QHS   losartan  50 mg Oral Daily   pregabalin  50 mg Oral TID   sodium chloride flush  3 mL Intravenous Q12H   topiramate  25 mg Oral QHS   Continuous Infusions:  sodium chloride Stopped (03/25/21 2117)   sodium chloride 85 mL/hr at 03/25/21 1754   ceFEPime (MAXIPIME) IV 2 g (03/27/21 1321)   PRN Meds:.acetaminophen **OR** acetaminophen, fluticasone, HYDROcodone-acetaminophen, HYDROmorphone (DILAUDID) injection, loratadine, menthol-cetylpyridinium **OR** phenol, ondansetron **OR** ondansetron (ZOFRAN) IV, polyethylene glycol, sodium chloride flush  HPI: Amy Hooper is a 71 y.o. female with history of obesity hypertension chronic kidney disease who had L4-5 stenosis with anterolisthesis and right-sided L5-S1 herniated nucleus pulposis.  She underwent right L4-5 transforaminal lumbar interbody fusion with pedicle instrumentation cage lateral fusion right L5-S1  microdiscectomy on March 09, 2021.  Unfortunately she developed drainage from her surgical site and there is concern for infection the patient was brought into the hospital electively by Dr. Inda Merlin who performed I&D of lumbar incision.  She has been on cefazolin.  Cultures at were reportedly growing a gram-negative rod which is now but identified as Enterobacter erogenous.  I have switched her from cefazolin to cefepime.  We will follow-up on culture data and hopefully will be an oral antibiotic we can choose to finish a course of therapy for her.  I spent 82 minutes with the patient including than 50% of the time in face to face counseling of the patient regarding the nature of postoperative infections the microbiology we are following personally reviewing x-ray lumbar spine along with CBC BMP updated cultures along with review of medical records in preparation for the visit and during the visit and in coordination of her care.    Review of Systems: Review of Systems  Constitutional:  Negative for chills, fever, malaise/fatigue and weight loss.  HENT:  Negative for congestion and sore throat.   Eyes:  Negative for blurred vision and photophobia.  Respiratory:  Negative for cough, shortness of breath and wheezing.   Cardiovascular:  Negative for chest pain, palpitations and leg swelling.  Gastrointestinal:  Negative for abdominal pain, blood in stool, constipation, diarrhea, heartburn, melena, nausea and vomiting.  Genitourinary:  Negative for dysuria, flank pain and hematuria.  Musculoskeletal:  Negative for back pain, falls, joint pain and myalgias.  Skin:  Negative for itching and rash.  Neurological:  Negative for dizziness, focal weakness, loss of consciousness, weakness and headaches.  Endo/Heme/Allergies:  Does not bruise/bleed easily.  Psychiatric/Behavioral:  Negative for depression and suicidal ideas. The patient does not have insomnia.    Past Medical History:  Diagnosis Date    Chronic kidney disease    Depression    Fibromyalgia    Gout    HTN (hypertension)    Insomnia    Morbid obesity (HCC)    Osteoarthritis    Palpitation    Sleep apnea     Social History   Tobacco Use   Smoking status: Never   Smokeless tobacco: Never  Vaping Use   Vaping Use: Never used  Substance Use Topics   Alcohol use: Never    Alcohol/week: 0.0 standard drinks   Drug use: Never    Family History  Problem Relation Age of Onset   Hypothyroidism Mother    Hyperlipidemia Other    Kidney failure Father    Dementia Father    Gout Father    Arthritis Father    Prostate cancer Father    Allergies  Allergen Reactions   Cefuroxime Axetil Rash and Hives   Clarithromycin Rash and Hives    Other reaction(s): Unknown   Nsaids Nausea And Vomiting    Other reaction(s): Unknown   Cefuroxime     Other reaction(s): Unknown   Oxycodone Other (See Comments)    hallucinations   Oxycodone-Acetaminophen Other (See Comments)    Hallucination Other reaction(s): Unknown   Robaxin [Methocarbamol] Nausea And Vomiting    "It tears my stomach up."   Toradol [Ketorolac Tromethamine] Nausea And Vomiting   Tramadol Nausea And Vomiting and Other (See Comments)    Upset stomach Other reaction(s): Unknown    OBJECTIVE: Blood pressure 129/62, pulse 68, temperature 98.2 F (36.8 C), temperature source Oral, resp. rate 19, height 5\' 2"  (1.575 m), weight 95.3 kg, SpO2 100 %.  Physical Exam Constitutional:      General: She is not in acute distress.    Appearance: Normal appearance. She is well-developed. She is not ill-appearing or diaphoretic.  HENT:     Head: Normocephalic and atraumatic.     Right Ear: Hearing and external ear normal.     Left Ear: Hearing and external ear normal.     Nose: No nasal deformity or rhinorrhea.  Eyes:     General: No scleral icterus.    Extraocular Movements: Extraocular movements intact.     Conjunctiva/sclera: Conjunctivae normal.     Right  eye: Right conjunctiva is not injected.     Left eye: Left conjunctiva is not injected.     Pupils: Pupils are equal, round, and reactive to light.  Neck:     Vascular: No JVD.  Cardiovascular:     Rate and Rhythm: Normal rate and regular rhythm.     Heart sounds: S1 normal and S2 normal.  Pulmonary:     Effort: Pulmonary effort is normal. No respiratory distress.  Abdominal:     General: There is no distension.     Palpations: Abdomen is soft.  Musculoskeletal:        General: Normal range of motion.     Right shoulder: Normal.     Left shoulder: Normal.     Cervical back: Normal range of motion and neck supple.     Right hip: Normal.     Left hip: Normal.     Right knee: Normal.     Left knee: Normal.  Lymphadenopathy:  Head:     Right side of head: No submandibular, preauricular or posterior auricular adenopathy.     Left side of head: No submandibular, preauricular or posterior auricular adenopathy.     Cervical: No cervical adenopathy.     Right cervical: No superficial or deep cervical adenopathy.    Left cervical: No superficial or deep cervical adenopathy.  Skin:    General: Skin is warm and dry.     Coloration: Skin is not pale.     Findings: No abrasion, bruising, ecchymosis, erythema, lesion or rash.     Nails: There is no clubbing.  Neurological:     General: No focal deficit present.     Mental Status: She is alert and oriented to person, place, and time.     Sensory: No sensory deficit.     Gait: Gait normal.  Psychiatric:        Attention and Perception: She is attentive.        Mood and Affect: Mood normal.        Speech: Speech normal.        Behavior: Behavior normal. Behavior is cooperative.        Thought Content: Thought content normal.        Judgment: Judgment normal.    Lab Results Lab Results  Component Value Date   WBC 7.1 03/27/2021   HGB 8.4 (L) 03/27/2021   HCT 25.8 (L) 03/27/2021   MCV 87.2 03/27/2021   PLT 559 (H) 03/27/2021     Lab Results  Component Value Date   CREATININE 1.85 (H) 03/27/2021   BUN 24 (H) 03/27/2021   NA 140 03/27/2021   K 4.0 03/27/2021   CL 106 03/27/2021   CO2 25 03/27/2021    Lab Results  Component Value Date   ALT 19 03/25/2021   AST 17 03/25/2021   ALKPHOS 216 (H) 03/25/2021   BILITOT 0.4 03/25/2021     Microbiology: Recent Results (from the past 240 hour(s))  Surgical pcr screen     Status: None   Collection Time: 03/25/21  1:19 PM   Specimen: Nasal Mucosa; Nasal Swab  Result Value Ref Range Status   MRSA, PCR NEGATIVE NEGATIVE Final   Staphylococcus aureus NEGATIVE NEGATIVE Final    Comment: (NOTE) The Xpert SA Assay (FDA approved for NASAL specimens in patients 63 years of age and older), is one component of a comprehensive surveillance program. It is not intended to diagnose infection nor to guide or monitor treatment. Performed at [facility], [place] [address], [place], [place] [address]   SARS Coronavirus 2 by RT PCR (hospital order, performed in [facility] hospital lab) Nasopharyngeal Nasopharyngeal Swab     Status: None   Collection Time: 03/25/21  1:19 PM   Specimen: Nasopharyngeal Swab  Result Value Ref Range Status   SARS Coronavirus 2 NEGATIVE NEGATIVE Final    Comment: (NOTE) SARS-CoV-2 target nucleic acids are NOT DETECTED.  The SARS-CoV-2 RNA is generally detectable in upper and lower respiratory specimens during the acute phase of infection. The lowest concentration of SARS-CoV-2 viral copies this assay can detect is 250 copies / mL. A negative result does not preclude SARS-CoV-2 infection and should not be used as the sole basis for treatment or other patient management decisions.  A negative result may occur with improper specimen collection / handling, submission of specimen other than nasopharyngeal swab, presence of viral mutation(s) within the areas targeted by this assay, and inadequate number of viral  copies (<250 copies / mL). A  negative result must be combined with clinical observations, patient history, and epidemiological information.  Fact Sheet for Patients:   StrictlyIdeas.no  Fact Sheet for Healthcare Providers: BankingDealers.co.za  This test is not yet approved or  cleared by the [place] FDA and has been authorized for detection and/or diagnosis of SARS-CoV-2 by FDA under an Emergency Use Authorization (EUA).  This EUA will remain in effect (meaning this test can be used) for the duration of the COVID-19 declaration under Section 564(b)(1) of the Act, 21 U.S.C. section 360bbb-3(b)(1), unless the authorization is terminated or revoked sooner.  Performed at [facility], [place] [address] [place], [place] [address]   Aerobic/Anaerobic Culture w Gram Stain (surgical/deep wound)     Status: None (Preliminary result)   Collection Time: 03/25/21  3:58 PM   Specimen: PATH Other; Tissue  Result Value Ref Range Status   Specimen Description WOUND  Final   Special Requests LUMBAR INCINSION  Final   Gram Stain NO WBC SEEN NO ORGANISMS SEEN   Final   Culture   Final    FEW ENTEROBACTER AEROGENES SUSCEPTIBILITIES TO FOLLOW CULTURE REINCUBATED FOR BETTER GROWTH Performed at [facility], [place] [address], [place], [place] [address]    Report Status PENDING  Incomplete    Alcide Evener, [place] [facility] [place] [facility] [phone] pager  03/27/2021, 2:59 PM

## 2021-03-27 NOTE — Progress Notes (Signed)
Pt placed themselves on home CPAP at bedside. RT refilled water tank. RT will cont to monitor.

## 2021-03-27 NOTE — TOC Initial Note (Addendum)
Transition of Care [facility]) - Initial/Assessment Note    Patient Details  Name: Amy Hooper MRN: [id number] Date of Birth: [birth year]  [facility] of [facility]) CM/SW Contact:    Verdell Carmine, RN Phone Number: 03/27/2021, 1:03 PM  Clinical Narrative:                  Patient presented with lumbar fusion with resulting seroma, back to surgery to flush out, wound vac placed. Expected discharge Monday with wound vac.  Called patient to discuss. Patient wants a home Health agency that works best with her insurance. Reached out to [facility], they are checking on this. Reached out to Traci at [facility] for home vac set up. They will electronically have MD sign and move forward after that. Called Dr Lorin Mercy office and left message to return call to discuss where to send order.  CM will follow for needs, recommendations, and transitions.  Alvis Lemmings- declined D/T staffing [facility]- not in contract [facility]- not incontract [facility]- declined [facility]- declined [facility]- not in Paramedic- awaiting call back  Expected Discharge Plan: [place] Barriers to Discharge: Continued Medical Work up   Patient Goals and CMS Choice        Expected Discharge Plan and Services Expected Discharge Plan: [place]   Discharge Planning Services: CM Consult   Living arrangements for the past 2 months: Single Family Home                           HH Arranged: RN [place] Agency: [place] [facility] Agency Contacted: 03/27/21 Time HH Agency Contacted: 37 Representative spoke with at [place]: [place] Arrangements/Services Living arrangements for the past 2 months: [place] Lives with:: Spouse Patient language and need for interpreter reviewed:: Yes Do you feel safe going back to the place where you live?: Yes      Need for Family Participation in Patient Care: Yes (Comment) Care giver support system in place?: Yes  (comment)   Criminal Activity/Legal Involvement Pertinent to Current Situation/Hospitalization: No - Comment as needed  Activities of Daily Living      Permission Sought/Granted                  Emotional Assessment   Attitude/Demeanor/Rapport: Engaged   Orientation: : Oriented to Self, Oriented to Place, Oriented to  Time, Oriented to Situation Alcohol / Substance Use: Not Applicable Psych Involvement: No (comment)  Admission diagnosis:  Postoperative complication of skin involving drainage from surgical wound [L76.82] Patient Active Problem List   Diagnosis Date Noted   Acute on chronic renal insufficiency 03/27/2021   Postoperative complication of skin involving drainage from surgical wound 03/25/2021   Post op infection 03/24/2021   S/P lumbar fusion 03/24/2021   Lumbar stenosis 03/09/2021   Degenerative spondylolisthesis 02/19/2021   Primary osteoarthritis of left knee 09/08/2020   DJD (degenerative joint disease) of knee 09/08/2020   Status post total left knee replacement 09/08/2020   Spondylosis without myelopathy or radiculopathy, lumbar region 07/17/2020   Chronic pain syndrome 07/17/2020   Myalgia 07/17/2020   OSA (obstructive sleep apnea) 05/19/2020   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/17/2020   Sleep disturbance 04/17/2020   Chronic pain of both shoulders 03/03/2020   Pain in both hands 03/03/2020   Primary osteoarthritis of both knees 03/03/2020   Pain in both feet 03/03/2020   History of chronic kidney  disease 03/03/2020   DDD (degenerative disc disease), lumbar 02/05/2020   Vitamin D deficiency 01/09/2020   Hyperlipidemia 01/09/2020   CKD (chronic kidney disease) stage 3, GFR 30-59 ml/min ([place]) 01/09/2020   HTN (hypertension)    Morbid obesity ([place])    Gout    Fibromyalgia    Depression    Insomnia    PCP:  Isaac Bliss, [name], MD Pharmacy:   [facility] - [place], [place] [address] Phone: [phone] Fax: [phone]  [id number] - [place], [place] - [address] [place] [address] Phone: [phone] Fax: [phone]  [place], [place] [address] Phone: [phone] Fax: [phone]     Social Determinants of Health (SDOH) Interventions    Readmission Risk Interventions No flowsheet data found.

## 2021-03-27 NOTE — Progress Notes (Signed)
° °  Subjective: 2 Days Post-Op Procedure(s) (LRB): LUMBAR POST OP INCISION IRRIGATION (N/A) APPLICATION OF WOUND VAC (N/A) Patient reports pain as mild.    Objective: Vital signs in last 24 hours: Temp:  [97.9 F (36.6 C)-98.5 F (36.9 C)] 98 F (36.7 C) (01/06 0900) Pulse Rate:  [67-72] 67 (01/06 0900) Resp:  [15-18] 18 (01/06 0900) BP: (128-136)/(62-64) 128/63 (01/06 0900) SpO2:  [97 %-100 %] 99 % (01/06 0900)  Intake/Output from previous day: 01/05 0701 - 01/06 0700 In: 76.4 [I.V.:26.4; IV Piggyback:50] Out: 450 [Urine:450] Intake/Output this shift: No intake/output data recorded.  Recent Labs    03/25/21 1345 03/26/21 0352 03/27/21 0148  HGB 10.0* 9.0* 8.4*   Recent Labs    03/26/21 0352 03/27/21 0148  WBC 6.6 7.1  RBC 3.18* 2.96*  HCT 27.3* 25.8*  PLT 585* 559*   Recent Labs    03/26/21 0352 03/27/21 0148  NA 139 140  K 4.9 4.0  CL 107 106  CO2 25 25  BUN 20 24*  CREATININE 1.78* 1.85*  GLUCOSE 174* 112*  CALCIUM 9.5 9.3   No results for input(s): LABPT, INR in the last 72 hours.  Neurologically intact No results found.  Assessment/Plan: 2 Days Post-Op Procedure(s) (LRB): LUMBAR POST OP INCISION IRRIGATION (N/A) APPLICATION OF WOUND VAC (N/A) Acute on chronic renal insufficiency with bump of creatinine post op 1.19 2 wks ago, 1.73 2 days ago , now 1.85 with BUN 24.   Nothing on cultures so far , lab to call me shortly for todays update. Anaerobic not released for 5 days.   Creatinine monitoring daily, plan Home Monday or sooner if [place] arranged and cultures give [place] treatment plan IV vs PO ABX . If nothing grows then will send home on Keflex 500mg  po bid.  ( Currently on Ancef) [name] 03/27/2021, 11:05 AM

## 2021-03-28 LAB — COMPREHENSIVE METABOLIC PANEL
ALT: 7 U/L (ref 0–44)
AST: 11 U/L — ABNORMAL LOW (ref 15–41)
Albumin: 2.9 g/dL — ABNORMAL LOW (ref 3.5–5.0)
Alkaline Phosphatase: 142 U/L — ABNORMAL HIGH (ref 38–126)
Anion gap: 7 (ref 5–15)
BUN: 28 mg/dL — ABNORMAL HIGH (ref 8–23)
CO2: 24 mmol/L (ref 22–32)
Calcium: 8.9 mg/dL (ref 8.9–10.3)
Chloride: 110 mmol/L (ref 98–111)
Creatinine, Ser: 1.49 mg/dL — ABNORMAL HIGH (ref 0.44–1.00)
GFR, Estimated: 38 mL/min — ABNORMAL LOW (ref >60)
Glucose, Bld: 97 mg/dL (ref 70–99)
Potassium: 4.3 mmol/L (ref 3.5–5.1)
Sodium: 141 mmol/L (ref 135–145)
Total Bilirubin: 0.2 mg/dL — ABNORMAL LOW (ref 0.3–1.2)
Total Protein: 5.9 g/dL — ABNORMAL LOW (ref 6.5–8.1)

## 2021-03-28 MED ORDER — BENZOIN EX LIQD
Freq: Once | CUTANEOUS | Status: AC
  Filled 2021-03-28: qty 120

## 2021-03-28 NOTE — TOC Progression Note (Addendum)
Transition of Care [facility]) - Progression Note    Patient Details  Name: Amy Hooper MRN: [id number] Date of Birth: Nov 26, 1950  [facility]) CM/SW Contact  Bartholomew Crews, RN Phone Number: [phone] 03/28/2021, 4:19 PM  Clinical Narrative:     Patient to need home wound vac at discharge. Pending order signature - provider emails sent to [facility].   Referral to [facility] declined d/t staffing. [facility]   UPDATE: [facility] RN referral for start of care on Wednesday. Patient stated that she has to go for a washout on Monday at 4pm, and may dc Tuesday or Wednesday. Advised that if she discharges on Wednesday, then start of care would be Friday.   TOC following for transition needs.   Expected Discharge Plan: [place] Barriers to Discharge: Continued Medical Work up  Expected Discharge Plan and Services Expected Discharge Plan: [place]   Discharge Planning Services: CM Consult   Living arrangements for the past 2 months: Single Family Home                           HH Arranged: RN [facility] Agency: [place] Date [facility] Agency Contacted: 03/27/21 Time [place]: 1215 Representative spoke with at [place]: [place] Determinants of Health ([place]) Interventions    Readmission Risk Interventions No flowsheet data found.

## 2021-03-28 NOTE — Plan of Care (Signed)
°  Problem: Education: Goal: Knowledge of General Education information will improve Description: Including pain rating scale, medication(s)/side effects and non-pharmacologic comfort measures Outcome: Progressing   Problem: Health Behavior/Discharge Planning: Goal: Ability to manage health-related needs will improve Outcome: Progressing   Problem: Clinical Measurements: Goal: Ability to maintain clinical measurements within normal limits will improve Outcome: Progressing   Problem: Clinical Measurements: Goal: Will remain free from infection Outcome: Progressing   Problem: Clinical Measurements: Goal: Diagnostic test results will improve Outcome: Progressing   Problem: Activity: Goal: Risk for activity intolerance will decrease Outcome: Progressing   Problem: Nutrition: Goal: Adequate nutrition will be maintained Outcome: Progressing   Problem: Pain Managment: Goal: General experience of comfort will improve Outcome: Progressing   Problem: Skin Integrity: Goal: Risk for impaired skin integrity will decrease Outcome: Progressing   Problem: Safety: Goal: Ability to remain free from injury will improve Outcome: Progressing

## 2021-03-28 NOTE — Progress Notes (Signed)
Assisted patient with home cpap, patient currently tolerating.

## 2021-03-28 NOTE — Progress Notes (Signed)
°  Subjective: Patient stable.  Wound VAC required changing today.  Some of this retention sutures also required removal because of the amount of drainage present   Objective: Vital signs in last 24 hours: Temp:  [98.2 F (36.8 C)-98.9 F (37.2 C)] 98.5 F (36.9 C) (01/07 0840) Pulse Rate:  [68-87] 87 (01/07 0840) Resp:  [17-19] 17 (01/07 0840) BP: (129-137)/(51-62) 132/61 (01/07 0840) SpO2:  [99 %-100 %] 100 % (01/07 0840)  Intake/Output from previous day: 01/06 0701 - 01/07 0700 In: 719.4 [P.O.:480; I.V.:39.3; IV Piggyback:200.1] Out: -  Intake/Output this shift: No intake/output data recorded.  Exam:  Intact pulses distally Dorsiflexion/Plantar flexion intact  Labs: Recent Labs    03/25/21 1345 03/26/21 0352 03/27/21 0148  HGB 10.0* 9.0* 8.4*   Recent Labs    03/26/21 0352 03/27/21 0148  WBC 6.6 7.1  RBC 3.18* 2.96*  HCT 27.3* 25.8*  PLT 585* 559*   Recent Labs    03/27/21 0148 03/28/21 0218  NA 140 141  K 4.0 4.3  CL 106 110  CO2 25 24  BUN 24* 28*  CREATININE 1.85* 1.49*  GLUCOSE 112* 97  CALCIUM 9.3 8.9   No results for input(s): LABPT, INR in the last 72 hours.  Assessment/Plan: Plan at this time is wound VAC is replaced.  We will see how it holds up.  Skin folds are preventing a great seal.  May require return to the OR depending on the amount of drainage.  She has had about 150 since surgery.   Landry Dyke Zayne Draheim 03/28/2021, 9:41 AM

## 2021-03-28 NOTE — Progress Notes (Signed)
° ° ° ° °  INFECTIOUS DISEASE ATTENDING ADDENDUM:   Date: 03/28/2021  Patient name: Amy Hooper  Medical record number: [id number]  Date of birth: 1950/09/19   Enterobacter is sensitive to all antibiotics with the exception of cefazolin  I am continuing cefepime for now in case of AMP C beta lactamase being harbored  One could use Bactrim once she is nearing DC but I understand she may go back to OR  Lower quinolone B would be another option but I would prefer to avoid that and the risk of C. difficile colitis     Rhina Brackett [place] 03/28/2021, 12:50 PM

## 2021-03-29 LAB — COMPREHENSIVE METABOLIC PANEL
ALT: 10 U/L (ref 0–44)
AST: 13 U/L — ABNORMAL LOW (ref 15–41)
Albumin: 2.7 g/dL — ABNORMAL LOW (ref 3.5–5.0)
Alkaline Phosphatase: 147 U/L — ABNORMAL HIGH (ref 38–126)
Anion gap: 7 (ref 5–15)
BUN: 30 mg/dL — ABNORMAL HIGH (ref 8–23)
CO2: 23 mmol/L (ref 22–32)
Calcium: 8.9 mg/dL (ref 8.9–10.3)
Chloride: 109 mmol/L (ref 98–111)
Creatinine, Ser: 1.45 mg/dL — ABNORMAL HIGH (ref 0.44–1.00)
GFR, Estimated: 39 mL/min — ABNORMAL LOW (ref >60)
Glucose, Bld: 106 mg/dL — ABNORMAL HIGH (ref 70–99)
Potassium: 4.5 mmol/L (ref 3.5–5.1)
Sodium: 139 mmol/L (ref 135–145)
Total Bilirubin: 0.2 mg/dL — ABNORMAL LOW (ref 0.3–1.2)
Total Protein: 5.8 g/dL — ABNORMAL LOW (ref 6.5–8.1)

## 2021-03-29 LAB — AEROBIC/ANAEROBIC CULTURE W GRAM STAIN (SURGICAL/DEEP WOUND): Gram Stain: NONE SEEN

## 2021-03-29 MED ORDER — POVIDONE-IODINE 10 % EX SWAB
2.0000 "application " | Freq: Once | CUTANEOUS | Status: AC
  Administered 2021-03-30: 2 via TOPICAL

## 2021-03-29 MED ORDER — CHLORHEXIDINE GLUCONATE 4 % EX LIQD
60.0000 mL | Freq: Once | CUTANEOUS | Status: AC
  Administered 2021-03-30: 4 via TOPICAL
  Filled 2021-03-29: qty 60

## 2021-03-29 MED ORDER — METRONIDAZOLE 500 MG PO TABS
500.0000 mg | ORAL_TABLET | Freq: Two times a day (BID) | ORAL | Status: DC
  Administered 2021-03-29 – 2021-04-06 (×17): 500 mg via ORAL
  Filled 2021-03-29 (×17): qty 1

## 2021-03-29 NOTE — Progress Notes (Signed)
RT added sterile water to patients home CPAP and patient placed self on machine.  Patient resting well.

## 2021-03-29 NOTE — Progress Notes (Addendum)
°  Subjective: Patient stable.  Wound VAC was changed yesterday.  She reports pain is overall controlled today.  No fevers, chills, night sweats.  She has had no difficulties with the wound VAC sponge since it was changed yesterday.  Denies any chest pain or shortness of breath.  Only other complaints are the pains from multiple failed IV attempts and sore throat since surgery.   Objective: Vital signs in last 24 hours: Temp:  [98.1 F (36.7 C)-98.5 F (36.9 C)] 98.5 F (36.9 C) (01/08 0444) Pulse Rate:  [67-79] 73 (01/08 0444) Resp:  [16-21] 16 (01/08 0444) BP: (89-154)/(49-69) 102/50 (01/08 0444) SpO2:  [97 %-99 %] 98 % (01/08 0444)  Intake/Output from previous day: 01/07 0701 - 01/08 0700 In: -  Out: 30 [Drains:30] Intake/Output this shift: Total I/O In: 500 [P.O.:500] Out: 70 [Drains:70]  Exam:  Wound VAC sponge with good suction.  No leaking.  Good compression of the sponge.  No increased amount of drainage in the wound VAC canister compared with yesterday.  Mildly cloudy serosanguineous drainage.  Labs: Recent Labs    03/27/21 0148  HGB 8.4*   Recent Labs    03/27/21 0148  WBC 7.1  RBC 2.96*  HCT 25.8*  PLT 559*   Recent Labs    03/28/21 0218 03/29/21 0251  NA 141 139  K 4.3 4.5  CL 110 109  CO2 24 23  BUN 28* 30*  CREATININE 1.49* 1.45*  GLUCOSE 97 106*  CALCIUM 8.9 8.9   No results for input(s): LABPT, INR in the last 72 hours.  Assessment/Plan: Plan is repeat debridement tomorrow afternoon by Dr. Lorin Mercy.  Wound VAC change is going well.  Lozenges for sore throat today.  Awaiting infectious disease input for continuation of IV versus trying p.o. antibiotics given her IV access difficulties, per Dr. Lorin Mercy.  PICC line for IV access short-term is contraindicated due to her history of CKD with GFR around 30   Jabe Jeanbaptiste L Miloh [place] 03/29/2021, 11:14 AM

## 2021-03-29 NOTE — Progress Notes (Signed)
Patient ID: Amy Hooper, female   DOB: April 25, 1950, 71 y.o.   MRN: [id number] Patient was seen by me yesterday and reviewed VAC changes have been done by Dr. Marlou Sa.  Appreciate his help in care and VAC change with some sutures that were removed.  Discussed with him and posted case after discussing with patient surgery on Monday afternoon for repeat debridement in the operating room and VAC change.  Hopefully she can go home either Tuesday or Wednesday.  ID team mentioned difficulty in IV access.  Will need infectious disease help determining whether IV antibiotics or p.o. antibiotics at discharge would be appropriate.

## 2021-03-29 NOTE — Progress Notes (Signed)
° ° ° ° °  INFECTIOUS DISEASE ATTENDING ADDENDUM:   Date: 03/29/2021  Patient name: Amy Hooper  Medical record number: [id number]  Date of birth: 01/26/1951     Was updated by Liane Comber through pharmacy the patient is growing not only the Enterobacter MRI Jenise but also Morganella morganii and some Bacteroides fragilis species that are beta-lactamase positive.  Will add metronidazole to cefepime.  Oral regimen we could use to cover all of these organisms would be Bactrim and Flagyl.  Patient going back to the operating room for repeat debridement on Monday.   Rhina Brackett Dam 03/29/2021, 2:07 PM

## 2021-03-30 ENCOUNTER — Inpatient Hospital Stay (HOSPITAL_COMMUNITY): Payer: BC Managed Care – PPO | Admitting: Anesthesiology

## 2021-03-30 ENCOUNTER — Other Ambulatory Visit: Payer: Self-pay

## 2021-03-30 ENCOUNTER — Encounter (HOSPITAL_COMMUNITY)
Admission: RE | Disposition: A | Discharge status: Home Health | Payer: Self-pay | Source: Home / Self Care | Attending: Orthopaedic Surgery

## 2021-03-30 ENCOUNTER — Encounter (HOSPITAL_COMMUNITY): Payer: Self-pay | Admitting: Orthopaedic Surgery

## 2021-03-30 DIAGNOSIS — L7682 Other postprocedural complications of skin and subcutaneous tissue: Secondary | ICD-10-CM | POA: Diagnosis not present

## 2021-03-30 HISTORY — PX: APPLICATION OF WOUND VAC: SHX5189

## 2021-03-30 HISTORY — PX: LUMBAR WOUND DEBRIDEMENT: SHX1988

## 2021-03-30 LAB — COMPREHENSIVE METABOLIC PANEL
ALT: 9 U/L (ref 0–44)
AST: 12 U/L — ABNORMAL LOW (ref 15–41)
Albumin: 2.7 g/dL — ABNORMAL LOW (ref 3.5–5.0)
Alkaline Phosphatase: 130 U/L — ABNORMAL HIGH (ref 38–126)
Anion gap: 6 (ref 5–15)
BUN: 32 mg/dL — ABNORMAL HIGH (ref 8–23)
CO2: 24 mmol/L (ref 22–32)
Calcium: 9.1 mg/dL (ref 8.9–10.3)
Chloride: 108 mmol/L (ref 98–111)
Creatinine, Ser: 1.43 mg/dL — ABNORMAL HIGH (ref 0.44–1.00)
GFR, Estimated: 40 mL/min — ABNORMAL LOW (ref >60)
Glucose, Bld: 106 mg/dL — ABNORMAL HIGH (ref 70–99)
Potassium: 4.3 mmol/L (ref 3.5–5.1)
Sodium: 138 mmol/L (ref 135–145)
Total Bilirubin: 0.3 mg/dL (ref 0.3–1.2)
Total Protein: 5.8 g/dL — ABNORMAL LOW (ref 6.5–8.1)

## 2021-03-30 LAB — HEMOGLOBIN AND HEMATOCRIT, BLOOD
HCT: 24 % — ABNORMAL LOW (ref 36.0–46.0)
Hemoglobin: 7.8 g/dL — ABNORMAL LOW (ref 12.0–15.0)

## 2021-03-30 SURGERY — LUMBAR WOUND DEBRIDEMENT
Anesthesia: General

## 2021-03-30 MED ORDER — DOCUSATE SODIUM 100 MG PO CAPS: 100.0000 mg | ORAL_CAPSULE | Freq: Two times a day (BID) | ORAL | Status: DC

## 2021-03-30 MED ORDER — SODIUM CHLORIDE 0.9 % IR SOLN
Status: DC | PRN
Start: 2021-03-30 — End: 2021-03-30
  Administered 2021-03-30: 1000 mL

## 2021-03-30 MED ORDER — ONDANSETRON HCL 4 MG PO TABS: 4.0000 mg | ORAL_TABLET | Freq: Four times a day (QID) | ORAL | Status: DC | PRN

## 2021-03-30 MED ORDER — PHENYLEPHRINE 40 MCG/ML (10ML) SYRINGE FOR IV PUSH (FOR BLOOD PRESSURE SUPPORT)
PREFILLED_SYRINGE | INTRAVENOUS | Status: AC
  Filled 2021-03-30: qty 10

## 2021-03-30 MED ORDER — HYDROCODONE-ACETAMINOPHEN 7.5-325 MG PO TABS
2.0000 | ORAL_TABLET | Freq: Once | ORAL | Status: AC
  Administered 2021-03-30: 2 via ORAL

## 2021-03-30 MED ORDER — CHLORHEXIDINE GLUCONATE 0.12 % MT SOLN
OROMUCOSAL | Status: AC
  Administered 2021-03-30: 15 mL via OROMUCOSAL
  Filled 2021-03-30: qty 15

## 2021-03-30 MED ORDER — PROPOFOL 10 MG/ML IV BOLUS
INTRAVENOUS | Status: DC | PRN
Start: 2021-03-30 — End: 2021-03-30
  Administered 2021-03-30: 130 mg via INTRAVENOUS

## 2021-03-30 MED ORDER — METOCLOPRAMIDE HCL 5 MG PO TABS: 5.0000 mg | ORAL_TABLET | Freq: Three times a day (TID) | ORAL | Status: DC | PRN

## 2021-03-30 MED ORDER — ONDANSETRON HCL 4 MG/2ML IJ SOLN: 4.0000 mg | Freq: Four times a day (QID) | INTRAMUSCULAR | Status: DC | PRN

## 2021-03-30 MED ORDER — SODIUM CHLORIDE 0.9 % IV SOLN: INTRAVENOUS | Status: DC

## 2021-03-30 MED ORDER — LIDOCAINE 2% (20 MG/ML) 5 ML SYRINGE
INTRAMUSCULAR | Status: DC | PRN
  Administered 2021-03-30: 50 mg via INTRAVENOUS

## 2021-03-30 MED ORDER — ROCURONIUM BROMIDE 10 MG/ML (PF) SYRINGE
PREFILLED_SYRINGE | INTRAVENOUS | Status: DC | PRN
Start: 2021-03-30 — End: 2021-03-30
  Administered 2021-03-30: 60 mg via INTRAVENOUS

## 2021-03-30 MED ORDER — LACTATED RINGERS IV SOLN: INTRAVENOUS | Status: DC

## 2021-03-30 MED ORDER — ONDANSETRON HCL 4 MG/2ML IJ SOLN
INTRAMUSCULAR | Status: AC
  Filled 2021-03-30: qty 2

## 2021-03-30 MED ORDER — DEXAMETHASONE SODIUM PHOSPHATE 10 MG/ML IJ SOLN
INTRAMUSCULAR | Status: AC
  Filled 2021-03-30: qty 1

## 2021-03-30 MED ORDER — METOCLOPRAMIDE HCL 5 MG/ML IJ SOLN
5.0000 mg | Freq: Three times a day (TID) | INTRAMUSCULAR | Status: DC | PRN
  Administered 2021-04-02: 10 mg via INTRAVENOUS
  Filled 2021-03-30: qty 2

## 2021-03-30 MED ORDER — PROPOFOL 10 MG/ML IV BOLUS
INTRAVENOUS | Status: AC
  Filled 2021-03-30: qty 20

## 2021-03-30 MED ORDER — TOBRAMYCIN SULFATE 80 MG/2ML IJ SOLN
INTRAMUSCULAR | Status: AC
  Filled 2021-03-30: qty 6

## 2021-03-30 MED ORDER — ROCURONIUM BROMIDE 10 MG/ML (PF) SYRINGE
PREFILLED_SYRINGE | INTRAVENOUS | Status: AC
  Filled 2021-03-30: qty 10

## 2021-03-30 MED ORDER — TOBRAMYCIN SULFATE 80 MG/2ML IJ SOLN
INTRAMUSCULAR | Status: DC | PRN
  Administered 2021-03-30: 240 mg

## 2021-03-30 MED ORDER — CHLORHEXIDINE GLUCONATE 0.12 % MT SOLN: 15.0000 mL | Freq: Once | OROMUCOSAL | Status: AC

## 2021-03-30 MED ORDER — FENTANYL CITRATE (PF) 250 MCG/5ML IJ SOLN
INTRAMUSCULAR | Status: DC | PRN
  Administered 2021-03-30: 100 ug via INTRAVENOUS
  Administered 2021-03-30 (×3): 50 ug via INTRAVENOUS

## 2021-03-30 MED ORDER — MIDAZOLAM HCL 2 MG/2ML IJ SOLN
INTRAMUSCULAR | Status: DC | PRN
  Administered 2021-03-30: 2 mg via INTRAVENOUS

## 2021-03-30 MED ORDER — MIDAZOLAM HCL 2 MG/2ML IJ SOLN
INTRAMUSCULAR | Status: AC
  Filled 2021-03-30: qty 2

## 2021-03-30 MED ORDER — PHENYLEPHRINE 40 MCG/ML (10ML) SYRINGE FOR IV PUSH (FOR BLOOD PRESSURE SUPPORT)
PREFILLED_SYRINGE | INTRAVENOUS | Status: DC | PRN
  Administered 2021-03-30 (×2): 40 ug via INTRAVENOUS
  Administered 2021-03-30: 120 ug via INTRAVENOUS

## 2021-03-30 MED ORDER — HYDROCODONE-ACETAMINOPHEN 7.5-325 MG PO TABS
ORAL_TABLET | ORAL | Status: AC
  Filled 2021-03-30: qty 2

## 2021-03-30 MED ORDER — FENTANYL CITRATE (PF) 100 MCG/2ML IJ SOLN
25.0000 ug | INTRAMUSCULAR | Status: DC | PRN
  Administered 2021-03-30: 50 ug via INTRAVENOUS

## 2021-03-30 MED ORDER — SUGAMMADEX SODIUM 200 MG/2ML IV SOLN
INTRAVENOUS | Status: DC | PRN
  Administered 2021-03-30: 200 mg via INTRAVENOUS

## 2021-03-30 MED ORDER — LIDOCAINE 2% (20 MG/ML) 5 ML SYRINGE
INTRAMUSCULAR | Status: AC
  Filled 2021-03-30: qty 5

## 2021-03-30 MED ORDER — 0.9 % SODIUM CHLORIDE (POUR BTL) OPTIME
TOPICAL | Status: DC | PRN
  Administered 2021-03-30: 1000 mL

## 2021-03-30 MED ORDER — FENTANYL CITRATE (PF) 250 MCG/5ML IJ SOLN
INTRAMUSCULAR | Status: AC
  Filled 2021-03-30: qty 5

## 2021-03-30 MED ORDER — ORAL CARE MOUTH RINSE: 15.0000 mL | Freq: Once | OROMUCOSAL | Status: AC

## 2021-03-30 MED ORDER — ONDANSETRON HCL 4 MG/2ML IJ SOLN
INTRAMUSCULAR | Status: DC | PRN
  Administered 2021-03-30: 4 mg via INTRAVENOUS

## 2021-03-30 MED ORDER — DEXAMETHASONE SODIUM PHOSPHATE 10 MG/ML IJ SOLN
INTRAMUSCULAR | Status: DC | PRN
  Administered 2021-03-30: 10 mg via INTRAVENOUS

## 2021-03-30 MED ORDER — FENTANYL CITRATE (PF) 100 MCG/2ML IJ SOLN
INTRAMUSCULAR | Status: AC
  Filled 2021-03-30: qty 2

## 2021-03-30 MED ORDER — ONDANSETRON HCL 4 MG/2ML IJ SOLN
4.0000 mg | Freq: Four times a day (QID) | INTRAMUSCULAR | Status: AC | PRN
  Administered 2021-03-30: 4 mg via INTRAVENOUS

## 2021-03-30 SURGICAL SUPPLY — 58 items
BAG COUNTER SPONGE SURGICOUNT (BAG) ×3 IMPLANT
CANISTER WOUND CARE 500ML ATS (WOUND CARE) ×1 IMPLANT
CORD BIPOLAR FORCEPS 12FT (ELECTRODE) ×2 IMPLANT
COVER SURGICAL LIGHT HANDLE (MISCELLANEOUS) ×1 IMPLANT
DRAPE HALF SHEET 40X57 (DRAPES) ×2 IMPLANT
DRAPE SURG 17X23 STRL (DRAPES) ×6 IMPLANT
DRSG PAD ABDOMINAL 8X10 ST (GAUZE/BANDAGES/DRESSINGS) ×1 IMPLANT
DRSG VAC ATS MED SENSATRAC (GAUZE/BANDAGES/DRESSINGS) ×1 IMPLANT
DURAPREP 26ML APPLICATOR (WOUND CARE) ×2 IMPLANT
ELECT BLADE 4.0 EZ CLEAN MEGAD (MISCELLANEOUS)
ELECT CAUTERY BLADE 6.4 (BLADE) ×2 IMPLANT
ELECT REM PT RETURN 9FT ADLT (ELECTROSURGICAL) ×2
ELECTRODE BLDE 4.0 EZ CLN MEGD (MISCELLANEOUS) IMPLANT
ELECTRODE REM PT RTRN 9FT ADLT (ELECTROSURGICAL) ×1 IMPLANT
EVACUATOR 1/8 PVC DRAIN (DRAIN) IMPLANT
GAUZE 4X4 16PLY ~~LOC~~+RFID DBL (SPONGE) ×1 IMPLANT
GAUZE SPONGE 4X4 12PLY STRL (GAUZE/BANDAGES/DRESSINGS) ×1 IMPLANT
GLOVE SRG 8 PF TXTR STRL LF DI (GLOVE) ×2 IMPLANT
GLOVE SURG ENC MOIS LTX SZ7.5 (GLOVE) ×4 IMPLANT
GLOVE SURG UNDER POLY LF SZ8 (GLOVE) ×2
GOWN STRL REUS W/ TWL LRG LVL3 (GOWN DISPOSABLE) ×1 IMPLANT
GOWN STRL REUS W/ TWL XL LVL3 (GOWN DISPOSABLE) ×1 IMPLANT
GOWN STRL REUS W/TWL 2XL LVL3 (GOWN DISPOSABLE) ×2 IMPLANT
GOWN STRL REUS W/TWL LRG LVL3 (GOWN DISPOSABLE) ×1
GOWN STRL REUS W/TWL XL LVL3 (GOWN DISPOSABLE) ×1
HANDPIECE INTERPULSE COAX TIP (DISPOSABLE) ×1
IV NS 1000ML (IV SOLUTION) ×1
IV NS 1000ML BAXH (IV SOLUTION) IMPLANT
KIT BASIN OR (CUSTOM PROCEDURE TRAY) ×2 IMPLANT
KIT STIMULAN RAPID CURE 5CC (Orthopedic Implant) ×1 IMPLANT
KIT TURNOVER KIT B (KITS) ×2 IMPLANT
MANIFOLD NEPTUNE II (INSTRUMENTS) ×2 IMPLANT
NDL SPNL 18GX3.5 QUINCKE PK (NEEDLE) ×2 IMPLANT
NEEDLE 22X1 1/2 (OR ONLY) (NEEDLE) ×2 IMPLANT
NEEDLE SPNL 18GX3.5 QUINCKE PK (NEEDLE) ×4 IMPLANT
NS IRRIG 1000ML POUR BTL (IV SOLUTION) ×2 IMPLANT
PACK LAMINECTOMY ORTHO (CUSTOM PROCEDURE TRAY) ×2 IMPLANT
PAD ARMBOARD 7.5X6 YLW CONV (MISCELLANEOUS) ×5 IMPLANT
PATTIES SURGICAL .5 X.5 (GAUZE/BANDAGES/DRESSINGS) IMPLANT
PATTIES SURGICAL .5 X1 (DISPOSABLE) IMPLANT
SET HNDPC FAN SPRY TIP SCT (DISPOSABLE) IMPLANT
SPONGE SURGIFOAM ABS GEL 100 (HEMOSTASIS) IMPLANT
SPONGE T-LAP 4X18 ~~LOC~~+RFID (SPONGE) ×1 IMPLANT
STRIP CLOSURE SKIN 1/2X4 (GAUZE/BANDAGES/DRESSINGS) ×2 IMPLANT
SUT MNCRL AB 3-0 PS2 18 (SUTURE) IMPLANT
SUT VIC AB 0 CT1 27 (SUTURE) ×1
SUT VIC AB 0 CT1 27XBRD ANBCTR (SUTURE) ×1 IMPLANT
SUT VIC AB 2-0 CT1 27 (SUTURE) ×1
SUT VIC AB 2-0 CT1 TAPERPNT 27 (SUTURE) ×1 IMPLANT
SWAB COLLECTION DEVICE MRSA (MISCELLANEOUS) IMPLANT
SWAB CULTURE ESWAB REG 1ML (MISCELLANEOUS) IMPLANT
SYR BULB IRRIG 60ML STRL (SYRINGE) ×2 IMPLANT
SYR CONTROL 10ML LL (SYRINGE) ×2 IMPLANT
TAPE CLOTH SURG 4X10 WHT LF (GAUZE/BANDAGES/DRESSINGS) ×1 IMPLANT
TOWEL GREEN STERILE (TOWEL DISPOSABLE) ×2 IMPLANT
TOWEL GREEN STERILE FF (TOWEL DISPOSABLE) ×2 IMPLANT
WATER STERILE IRR 1000ML POUR (IV SOLUTION) ×1 IMPLANT
YANKAUER SUCT BULB TIP NO VENT (SUCTIONS) ×2 IMPLANT

## 2021-03-30 NOTE — Progress Notes (Signed)
Arrived on unit at 1815. Alert and oriented. Wound vac at 112mm/Hg no leaks noted. Dressing clean dry and intact. Complaints of pain level 10, appears drowsy. Will evaluate medication following administration of medications in PACU.

## 2021-03-30 NOTE — Anesthesia Procedure Notes (Signed)
Procedure Name: Intubation Date/Time: 03/30/2021 4:25 PM Performed by: Lorie Phenix, CRNA Pre-anesthesia Checklist: Patient identified, Emergency Drugs available, Suction available and Patient being monitored Patient Re-evaluated:Patient Re-evaluated prior to induction Oxygen Delivery Method: Circle system utilized Preoxygenation: Pre-oxygenation with 100% oxygen Induction Type: IV induction Ventilation: Mask ventilation without difficulty Laryngoscope Size: Mac and 3 Grade View: Grade I Tube type: Oral Tube size: 7.5 mm Number of attempts: 1 Airway Equipment and Method: Stylet Placement Confirmation: ETT inserted through vocal cords under direct vision, positive ETCO2 and breath sounds checked- equal and bilateral Secured at: 22 cm Tube secured with: Tape Dental Injury: Teeth and Oropharynx as per pre-operative assessment

## 2021-03-30 NOTE — Transfer of Care (Signed)
Immediate Anesthesia Transfer of Care Note  [name]  Procedure(s) Performed: REPEAT LUMBAR WOUND DEBRIDEMENT WOUND VAC CHANGE 12x6x5  Patient Location: PACU  Anesthesia Type:General  Level of Consciousness: awake and alert   Airway & Oxygen Therapy: Patient Spontanous Breathing and Patient connected to face mask oxygen  Post-op Assessment: Report given to RN and Post -op Vital signs reviewed and stable  Post vital signs: Reviewed and stable  Last Vitals:  Vitals Value Taken Time  BP 165/64 03/30/21 1723  Temp 36.7 C 03/30/21 1723  Pulse 94 03/30/21 1725  Resp 13 03/30/21 1725  SpO2 95 % 03/30/21 1725  Vitals shown include unvalidated device data.  Last Pain:  Vitals:   03/30/21 1543  TempSrc: Oral  PainSc: 0-No pain      Patients Stated Pain Goal: 2 (22/41/14 6431)  Complications: No notable events documented.

## 2021-03-30 NOTE — Progress Notes (Signed)
[facility] for Infectious Disease  Date of Admission:  03/25/2021     Total days of antibiotics 6         ASSESSMENT:  Amy Hooper is scheduled for repeat lumbar wound debridement with Dr. Lorin Mercy today. Cultures are growing Enterobacter aerogenes, Morganella morganii, and Bacteroides fragilis. Currently tolerating Cefepime and metronidazole. Will evaluate IV vs oral therapy pending surgical findings today. For now continue with cefepime and metronidazole.  Remaining medical and supportive care per primary team.   PLAN:  Continue cefepime and metronidazole.  Repeat I&D today with Dr. Lorin Mercy.  Final recommendations pending surgical outcomes.  Remaining medical ans supportive care per primary team.   Principal Problem:   Postoperative complication of skin involving drainage from surgical wound Active Problems:   Acute on chronic renal insufficiency    allopurinol  200 mg Oral Daily   amLODipine  5 mg Oral Daily   atorvastatin  10 mg Oral QHS   chlorhexidine  60 mL Topical Once   docusate sodium  100 mg Oral BID   DULoxetine  30 mg Oral QHS   losartan  50 mg Oral Daily   metroNIDAZOLE  500 mg Oral Q12H   povidone-iodine  2 application Topical Once   pregabalin  50 mg Oral TID   sodium chloride flush  3 mL Intravenous Q12H   topiramate  25 mg Oral QHS    SUBJECTIVE:  Afebrile overnight with no acute events. Plans to return to the OR today. Has ache/soreness in her back.   Allergies  Allergen Reactions   Cefuroxime Axetil Rash and Hives   Clarithromycin Rash and Hives    Other reaction(s): Unknown   Nsaids Nausea And Vomiting    Other reaction(s): Unknown   Cefuroxime     Other reaction(s): Unknown   Oxycodone Other (See Comments)    hallucinations   Oxycodone-Acetaminophen Other (See Comments)    Hallucination Other reaction(s): Unknown   Robaxin [Methocarbamol] Nausea And Vomiting    "It tears my stomach up."   Toradol [Ketorolac Tromethamine] Nausea And  Vomiting   Tramadol Nausea And Vomiting and Other (See Comments)    Upset stomach Other reaction(s): Unknown     Review of Systems: Review of Systems  Constitutional:  Negative for chills, fever and weight loss.  Respiratory:  Negative for cough, shortness of breath and wheezing.   Cardiovascular:  Negative for chest pain and leg swelling.  Gastrointestinal:  Negative for abdominal pain, constipation, diarrhea, nausea and vomiting.  Musculoskeletal:  Positive for back pain.  Skin:  Negative for rash.     OBJECTIVE: Vitals:   03/29/21 2128 03/30/21 0310 03/30/21 0739 03/30/21 1108  BP: (!) 167/76 (!) 126/56 (!) 126/53 131/62  Pulse: 66 72 73 67  Resp:  18 18 18   Temp: 98.4 F (36.9 C) 98.1 F (36.7 C) 97.9 F (36.6 C) 98.1 F (36.7 C)  TempSrc: Oral Oral Oral Oral  SpO2: 100% 100% 100% 98%  Weight:      Height:       Body mass index is 38.41 kg/m.  Physical Exam Constitutional:      General: Amy Hooper is not in acute distress.    Appearance: Amy Hooper is well-developed.     Comments: Lying in bed with head of bed elevated; pleasant.   Cardiovascular:     Rate and Rhythm: Normal rate and regular rhythm.     Heart sounds: Normal heart sounds.  Pulmonary:     Effort: Pulmonary effort is  normal.     Breath sounds: Normal breath sounds.  Musculoskeletal:     Comments: Wound VAC in place.   Skin:    General: Skin is warm and dry.  Neurological:     Mental Status: Amy Hooper is alert and oriented to person, place, and time.  Psychiatric:        Behavior: Behavior normal.        Thought Content: Thought content normal.        Judgment: Judgment normal.    Lab Results Lab Results  Component Value Date   WBC 7.1 03/27/2021   HGB 7.8 (L) 03/30/2021   HCT 24.0 (L) 03/30/2021   MCV 87.2 03/27/2021   PLT 559 (H) 03/27/2021    Lab Results  Component Value Date   CREATININE 1.43 (H) 03/30/2021   BUN 32 (H) 03/30/2021   NA 138 03/30/2021   K 4.3 03/30/2021   CL 108 03/30/2021    CO2 24 03/30/2021    Lab Results  Component Value Date   ALT 9 03/30/2021   AST 12 (L) 03/30/2021   ALKPHOS 130 (H) 03/30/2021   BILITOT 0.3 03/30/2021     Microbiology: Recent Results (from the past 240 hour(s))  Surgical pcr screen     Status: None   Collection Time: 03/25/21  1:19 PM   Specimen: Nasal Mucosa; Nasal Swab  Result Value Ref Range Status   MRSA, PCR NEGATIVE NEGATIVE Final   Staphylococcus aureus NEGATIVE NEGATIVE Final    Comment: (NOTE) The Xpert SA Assay (FDA approved for NASAL specimens in patients 75 years of age and older), is one component of a comprehensive surveillance program. It is not intended to diagnose infection nor to guide or monitor treatment. Performed at [facility], [place] [address], [place], [place] [address]   SARS Coronavirus 2 by RT PCR (hospital order, performed in [facility] hospital lab) Nasopharyngeal Nasopharyngeal Swab     Status: None   Collection Time: 03/25/21  1:19 PM   Specimen: Nasopharyngeal Swab  Result Value Ref Range Status   SARS Coronavirus 2 NEGATIVE NEGATIVE Final    Comment: (NOTE) SARS-CoV-2 target nucleic acids are NOT DETECTED.  The SARS-CoV-2 RNA is generally detectable in upper and lower respiratory specimens during the acute phase of infection. The lowest concentration of SARS-CoV-2 viral copies this assay can detect is 250 copies / mL. A negative result does not preclude SARS-CoV-2 infection and should not be used as the sole basis for treatment or other patient management decisions.  A negative result may occur with improper specimen collection / handling, submission of specimen other than nasopharyngeal swab, presence of viral mutation(s) within the areas targeted by this assay, and inadequate number of viral copies (<250 copies / mL). A negative result must be combined with clinical observations, patient history, and epidemiological information.  Fact Sheet for Patients:    StrictlyIdeas.no  Fact Sheet for Healthcare Providers: BankingDealers.co.za  This test is not yet approved or  cleared by the [place] FDA and has been authorized for detection and/or diagnosis of SARS-CoV-2 by FDA under an Emergency Use Authorization (EUA).  This EUA will remain in effect (meaning this test can be used) for the duration of the COVID-19 declaration under Section 564(b)(1) of the Act, 21 U.S.C. section 360bbb-3(b)(1), unless the authorization is terminated or revoked sooner.  Performed at [facility], [place] [address], [place], [place] [address]   Aerobic/Anaerobic Culture w Gram Stain (surgical/deep wound)  Status: None   Collection Time: 03/25/21  3:58 PM   Specimen: PATH Other; Tissue  Result Value Ref Range Status   Specimen Description WOUND  Final   Special Requests LUMBAR INCINSION  Final   Gram Stain NO WBC SEEN NO ORGANISMS SEEN   Final   Culture   Final    FEW ENTEROBACTER AEROGENES FEW MORGANELLA MORGANII RARE BACTEROIDES FRAGILIS BETA LACTAMASE POSITIVE Performed at [facility], [place] [address], [place], [place] [address]    Report Status 03/29/2021 FINAL  Final   Organism ID, Bacteria ENTEROBACTER AEROGENES  Final   Organism ID, Bacteria MORGANELLA MORGANII  Final      Susceptibility   Enterobacter aerogenes - MIC*    CEFAZOLIN >=64 RESISTANT Resistant     CEFEPIME <=0.12 SENSITIVE Sensitive     CEFTAZIDIME <=1 SENSITIVE Sensitive     CEFTRIAXONE <=0.25 SENSITIVE Sensitive     CIPROFLOXACIN <=0.25 SENSITIVE Sensitive     GENTAMICIN <=1 SENSITIVE Sensitive     IMIPENEM 1 SENSITIVE Sensitive     TRIMETH/SULFA <=20 SENSITIVE Sensitive     PIP/TAZO <=4 SENSITIVE Sensitive     * FEW ENTEROBACTER AEROGENES   Morganella morganii - MIC*    AMPICILLIN >=32 RESISTANT Resistant     CEFAZOLIN >=64 RESISTANT Resistant     CEFTAZIDIME <=1 SENSITIVE Sensitive     CIPROFLOXACIN <=0.25  SENSITIVE Sensitive     GENTAMICIN <=1 SENSITIVE Sensitive     IMIPENEM 2 SENSITIVE Sensitive     TRIMETH/SULFA <=20 SENSITIVE Sensitive     AMPICILLIN/SULBACTAM 8 SENSITIVE Sensitive     PIP/TAZO <=4 SENSITIVE Sensitive     * FEW MORGANELLA MORGANII     Terri Piedra, NP [facility]ious [facility]  03/30/2021  1:16 PM

## 2021-03-30 NOTE — Interval H&P Note (Signed)
History and Physical Interval Note:  03/30/2021 3:54 PM  Amy Hooper  has presented today for surgery, with the diagnosis of post op seroma infection.  The various methods of treatment have been discussed with the patient and family. After consideration of risks, benefits and other options for treatment, the patient has consented to  Procedure(s): REPEAT LUMBAR WOUND DEBRIDEMENT (N/A) WOUND VAC CHANGE (N/A) as a surgical intervention.  The patient's history has been reviewed, patient examined, no change in status, stable for surgery.  I have reviewed the patient's chart and labs.  Questions were answered to the patient's satisfaction.     [name]

## 2021-03-30 NOTE — TOC Progression Note (Addendum)
Transition of Care [facility]) - Progression Note    Patient Details  Name: Amy Hooper MRN: [id number] Date of Birth: [birth year]  Transition of Care [facility]) CM/SW Contact  Bartholomew Crews, RN Phone Number: [phone] 03/30/2021, 2:00 PM  Clinical Narrative:     [employer] vac order form completed/signed by provider. Faxed order to [facility] - confirmed receipt. Noted [place] RN orders placed on 1/6. TOC following for transition needs.   Expected Discharge Plan: [place] Barriers to Discharge: Continued Medical Work up  Expected Discharge Plan and Services Expected Discharge Plan: [place]   Discharge Planning Services: CM Consult   Living arrangements for the past 2 months: Single Family Home                           HH Arranged: RN [facility] Agency: [place] Date [facility] Agency Contacted: 03/27/21 Time [place]: 1215 Representative spoke with at [place]: [place] Determinants of Health ([place]) Interventions    Readmission Risk Interventions No flowsheet data found.

## 2021-03-30 NOTE — Op Note (Signed)
Preop diagnosis:" Lumbar subcutaneous infection.  Postop diagnosis same  Procedure : 6 repeat sharp excisional debridement, VAC change.Placement of Stimulan Tobramycin beads 375mg    Surgeon: Rodell Perna, MD  Assistant: Benjiman Core, PA-C medically necessary and present for the entire procedure  Anesthesia is General orotracheal.  Brief history: 71 year old female morbidly obese lost weight for lumbar fusion procedure with severe stenosis.  Postop she had continued poor caloric intake and albumin continue to drop.  3 weeks postop for wound started having some serous drainage.  She was taken back to the operating room probably for washout procedure initially there was slight serosanguineous drainage no evidence of infection and cultures showed just a few colonies ultimately grew few Enterobacter.  Rods and 80s, Morganella MorganII and rare Bacteroides fragilis.  She has had VAC change for now she is having more purulent drainage from her back and is taken back for repeat washout procedure.  Procedure: After induction of general anesthesia patient placed on chest rolls patient is already on antibiotics timeout procedure was completed.  Remaining sutures were removed and VAC sponge was removed.  There was some necrotic fat which was sharply excised and using pickups a scalpel went completely around excising few millimeters of skin and fat.  Multiple arterial bleeders were coagulated with cautery.  Debridement with pickups and sharp scissors and curette was used.  Pulse lavage was used.  Pressure was applied no fluid came out from the fascia.  Patient had a thick adipose layer was felt best to foot a medium VAC and leave the wound open.  VAC sponge was cut VAC was applied with low leak rate.  With patient's thick adipose layer I think it be best to let this granulate from the bottom particularly since despite appropriate antibiotics VAC products had significant purulence.  Prior to placing a sponge mixed some  Stimulan of each with tobramycin and placed him in an incision prior to placing a sponge.  Patient was placed back spine extubated and transferred recovery room in stable condition.

## 2021-03-30 NOTE — Progress Notes (Signed)
Mobility Specialist Progress Note   03/30/21 1519  Mobility  Activity Off unit (Pt leaving for an I&D procedure upon entry)   Holland Falling Mobility Specialist Phone Number [phone]

## 2021-03-30 NOTE — TOC Progression Note (Addendum)
Transition of Care [facility]) - Progression Note    Patient Details  Name: Amy Hooper MRN: [id number] Date of Birth: 12/10/1950  Transition of Care [facility]) CM/SW Contact  Bartholomew Crews, RN Phone Number: [phone] 03/30/2021, 8:27 AM  Clinical Narrative:     Noted MD notes that patient to return to OR today with anticipated discharge on Tuesday or Wednesday.   [employer] short order form on front of patient chart pending provider completion and signature.   Call to [place] to verify acceptance for [facility] RN. Start of care on Wednesday if discharge on Tuesday, or Friday if discharges on Wednesday - patient will need dressing change in hospital if discharge is on Wednesday. Verification . Update 0940 AM - confirmed referral accepted. Patient will need [facility] RN order for wound vac dressing changes and Face to Face.    TOC following for transition needs. Please reach out with questions.   Expected Discharge Plan: [place] Barriers to Discharge: Continued Medical Work up  Expected Discharge Plan and Services Expected Discharge Plan: [place]   Discharge Planning Services: CM Consult   Living arrangements for the past 2 months: Single Family Home                           HH Arranged: RN [facility] Agency: [place] Date [facility] Agency Contacted: 03/27/21 Time [place]: 1215 Representative spoke with at [place]: [place] Determinants of Health ([place]) Interventions    Readmission Risk Interventions No flowsheet data found.

## 2021-03-30 NOTE — Plan of Care (Signed)
°  Problem: Education: Goal: Knowledge of General Education information will improve Description: Including pain rating scale, medication(s)/side effects and non-pharmacologic comfort measures Outcome: Progressing   Problem: Health Behavior/Discharge Planning: Goal: Ability to manage health-related needs will improve Outcome: Progressing   Problem: Clinical Measurements: Goal: Ability to maintain clinical measurements within normal limits will improve Outcome: Progressing Goal: Will remain free from infection Outcome: Progressing Goal: Diagnostic test results will improve Outcome: Progressing Goal: Respiratory complications will improve Outcome: Progressing Goal: Cardiovascular complication will be avoided Outcome: Progressing   Problem: Clinical Measurements: Goal: Will remain free from infection Outcome: Progressing   Problem: Clinical Measurements: Goal: Respiratory complications will improve Outcome: Progressing   Problem: Nutrition: Goal: Adequate nutrition will be maintained Outcome: Progressing   Problem: Coping: Goal: Level of anxiety will decrease Outcome: Progressing   Problem: Pain Managment: Goal: General experience of comfort will improve Outcome: Progressing   Problem: Safety: Goal: Ability to remain free from injury will improve Outcome: Progressing   Problem: Skin Integrity: Goal: Risk for impaired skin integrity will decrease Outcome: Progressing

## 2021-03-30 NOTE — Anesthesia Preprocedure Evaluation (Signed)
Anesthesia Evaluation  Patient identified by MRN, date of birth, ID band Patient awake    Reviewed: Allergy & Precautions, H&P , NPO status , Patient's Chart, lab work & pertinent test results  Airway Mallampati: II   Neck ROM: full    Dental   Pulmonary sleep apnea ,    breath sounds clear to auscultation       Cardiovascular hypertension,  Rhythm:regular Rate:Normal     Neuro/Psych PSYCHIATRIC DISORDERS Depression  Neuromuscular disease    GI/Hepatic   Endo/Other    Renal/GU Renal InsufficiencyRenal disease     Musculoskeletal  (+) Arthritis , Fibromyalgia -  Abdominal   Peds  Hematology  (+) anemia ,   Anesthesia Other Findings   Reproductive/Obstetrics                             Anesthesia Physical Anesthesia Plan  ASA: 3  Anesthesia Plan: General   Post-op Pain Management:    Induction: Intravenous  PONV Risk Score and Plan: 3 and Ondansetron, Dexamethasone, Midazolam and Treatment may vary due to age or medical condition  Airway Management Planned: Oral ETT  Additional Equipment:   Intra-op Plan:   Post-operative Plan: Extubation in OR  Informed Consent: I have reviewed the patients History and Physical, chart, labs and discussed the procedure including the risks, benefits and alternatives for the proposed anesthesia with the patient or authorized representative who has indicated his/her understanding and acceptance.     Dental advisory given  Plan Discussed with: CRNA, Anesthesiologist and Surgeon  Anesthesia Plan Comments:         Anesthesia Quick Evaluation

## 2021-03-31 ENCOUNTER — Inpatient Hospital Stay: Payer: Self-pay

## 2021-03-31 ENCOUNTER — Encounter (HOSPITAL_COMMUNITY): Payer: Self-pay | Admitting: Orthopaedic Surgery

## 2021-03-31 DIAGNOSIS — L7682 Other postprocedural complications of skin and subcutaneous tissue: Secondary | ICD-10-CM | POA: Diagnosis not present

## 2021-03-31 LAB — BASIC METABOLIC PANEL
Anion gap: 8 (ref 5–15)
BUN: 28 mg/dL — ABNORMAL HIGH (ref 8–23)
CO2: 23 mmol/L (ref 22–32)
Calcium: 9.4 mg/dL (ref 8.9–10.3)
Chloride: 109 mmol/L (ref 98–111)
Creatinine, Ser: 1.46 mg/dL — ABNORMAL HIGH (ref 0.44–1.00)
GFR, Estimated: 39 mL/min — ABNORMAL LOW (ref >60)
Glucose, Bld: 168 mg/dL — ABNORMAL HIGH (ref 70–99)
Potassium: 4.6 mmol/L (ref 3.5–5.1)
Sodium: 140 mmol/L (ref 135–145)

## 2021-03-31 LAB — CBC
HCT: 25.5 % — ABNORMAL LOW (ref 36.0–46.0)
Hemoglobin: 8.5 g/dL — ABNORMAL LOW (ref 12.0–15.0)
MCH: 28.6 pg (ref 26.0–34.0)
MCHC: 33.3 g/dL (ref 30.0–36.0)
MCV: 85.9 fL (ref 80.0–100.0)
Platelets: 447 10*3/uL — ABNORMAL HIGH (ref 150–400)
RBC: 2.97 MIL/uL — ABNORMAL LOW (ref 3.87–5.11)
RDW: 14.3 % (ref 11.5–15.5)
WBC: 6.1 10*3/uL (ref 4.0–10.5)
nRBC: 0 % (ref 0.0–0.2)

## 2021-03-31 MED ORDER — CEFEPIME IV (FOR PTA / DISCHARGE USE ONLY): 2.0000 g | Freq: Two times a day (BID) | INTRAVENOUS | 0 refills | Status: AC

## 2021-03-31 MED ORDER — METRONIDAZOLE 500 MG PO TABS: 500.0000 mg | ORAL_TABLET | Freq: Two times a day (BID) | ORAL | 0 refills | Status: AC

## 2021-03-31 NOTE — Progress Notes (Signed)
Patient ID: Amy Hooper, female   DOB: [birth year], 71 y.o.   MRN: [id number]   Subjective: 1 Day Post-Op Procedure(s) (LRB): REPEAT LUMBAR WOUND DEBRIDEMENT (N/A) WOUND VAC CHANGE 12x6x5 (N/A) Patient reports pain as moderate and severe.    Objective: Vital signs in last 24 hours: Temp:  [98 F (36.7 C)-98.7 F (37.1 C)] 98 F (36.7 C) (01/10 0537) Pulse Rate:  [66-99] 90 (01/10 0537) Resp:  [11-20] 19 (01/10 0537) BP: (119-170)/(49-81) 119/81 (01/10 0537) SpO2:  [93 %-99 %] 98 % (01/10 0537)  Intake/Output from previous day: 01/09 0701 - 01/10 0700 In: 700 [I.V.:700] Out: 100 [Blood:100] Intake/Output this shift: No intake/output data recorded.  Recent Labs    03/30/21 0246 03/31/21 0123  HGB 7.8* 8.5*   Recent Labs    03/30/21 0246 03/31/21 0123  WBC  --  6.1  RBC  --  2.97*  HCT 24.0* 25.5*  PLT  --  447*   Recent Labs    03/30/21 0246 03/31/21 0123  NA 138 140  K 4.3 4.6  CL 108 109  CO2 24 23  BUN 32* 28*  CREATININE 1.43* 1.46*  GLUCOSE 106* 168*  CALCIUM 9.1 9.4   No results for input(s): LABPT, INR in the last 72 hours.  Neurologically intact No results found.  Assessment/Plan: 1 Day Post-Op Procedure(s) (LRB): REPEAT LUMBAR WOUND DEBRIDEMENT (N/A) WOUND VAC CHANGE 12x6x5 (N/A) Up with therapy, VAC nurse change Wednesday. Likely will need a couple weeks IV ABX then PO ABX for 4 wks at least.  I put tobra Stimulan beads in wound and wilol be stuck to sponge .    Amy Hooper 03/31/2021, 7:44 AM

## 2021-03-31 NOTE — Progress Notes (Signed)
Home cpap set up at patient bedside ready for use. Pt stated she is able to place self on when ready.

## 2021-03-31 NOTE — Progress Notes (Signed)
Mobility Specialist Criteria Algorithm Info. ° ° 03/31/21 1530  °Mobility  °Activity Ambulated in hall °(in chair before and after ambulation)  °Range of Motion/Exercises Active;All extremities  °Level of Assistance Independent after set-up  °Distance Ambulated (ft) 480 ft  °Mobility Ambulated independently in hallway  °Mobility Response Tolerated well  °Mobility performed by Mobility specialist  °Bed Position Chair  ° °Patient received in chair eager to participate in mobility. Ambulated in hallway with steady gait. Required standing rest break x1 secondary to SOB and fatigue. Returned to room without complaint or incident. Was left with all needs met and RN present.  ° °03/31/2021 °3:51 PM ° °

## 2021-03-31 NOTE — Progress Notes (Signed)
[facility] for Infectious Disease  Date of Admission:  03/25/2021     Total days of antibiotics 7         ASSESSMENT:  Ms. Schloesser is POD #1 from repeat debridement and wound vac change. Given placement of new hardware with this post-surgical infection it would be prudent to treat as if hardware is infected and recommend 2 weeks IV therapy with cefepime and oral metronidazole and transition to oral therapy with levofloxacin and metronidazole. Discussed potential need for prolonged suppression and will arrange follow up in ID clinic. Will need PICC prior to discharge and OPAT orders placed below. Remaining medical and supportive care per primary team.   PLAN:  Continue cefepime and metronidazole. Wound care per Dr. Lorin Mercy.  OPAT / Home Health orders below.  PICC line prior to discharge Follow up in ID clinic.  Remaining medical and supportive care per Primary team.   Diagnosis: Post operative wound infection   Culture Result: Enterobacter aerogenes, Morganella morganii, and Bacteroides   Allergies  Allergen Reactions   Cefuroxime Axetil Rash and Hives   Clarithromycin Rash and Hives    Other reaction(s): Unknown   Nsaids Nausea And Vomiting    Other reaction(s): Unknown   Cefuroxime     Other reaction(s): Unknown   Oxycodone Other (See Comments)    hallucinations   Oxycodone-Acetaminophen Other (See Comments)    Hallucination Other reaction(s): Unknown   Robaxin [Methocarbamol] Nausea And Vomiting    "It tears my stomach up."   Toradol [Ketorolac Tromethamine] Nausea And Vomiting   Tramadol Nausea And Vomiting and Other (See Comments)    Upset stomach Other reaction(s): Unknown    OPAT Orders Discharge antibiotics to be given via PICC line Discharge antibiotics: Per pharmacy protocol  Duration: 2 weeks  End Date: 04/14/21  [facility] Care Per Protocol:  Home health RN for IV administration and teaching; PICC line care and labs.    Labs weekly while on IV  antibiotics: __ CBC with differential _X_ BMP __ CMP _X_ CRP _X_ ESR __ Vancomycin trough __ CK  _X_ Please pull PIC at completion of IV antibiotics __ Please leave PIC in place until doctor has seen patient or been notified  Fax weekly labs to [phone]  Clinic Follow Up Appt: With Dr. Candiss Norse on 04/14/21 at 10:30am   Principal Problem:   Postoperative complication of skin involving drainage from surgical wound Active Problems:   Acute on chronic renal insufficiency    allopurinol  200 mg Oral Daily   amLODipine  5 mg Oral Daily   atorvastatin  10 mg Oral QHS   docusate sodium  100 mg Oral BID   DULoxetine  30 mg Oral QHS   losartan  50 mg Oral Daily   metroNIDAZOLE  500 mg Oral Q12H   pregabalin  50 mg Oral TID   sodium chloride flush  3 mL Intravenous Q12H   topiramate  25 mg Oral QHS    SUBJECTIVE:  Afebrile overnight with no acute events. Questions about the plan of care.   Allergies  Allergen Reactions   Cefuroxime Axetil Rash and Hives   Clarithromycin Rash and Hives    Other reaction(s): Unknown   Nsaids Nausea And Vomiting    Other reaction(s): Unknown   Cefuroxime     Other reaction(s): Unknown   Oxycodone Other (See Comments)    hallucinations   Oxycodone-Acetaminophen Other (See Comments)    Hallucination Other reaction(s): Unknown   Robaxin [Methocarbamol]  And Vomiting  °  "It tears my stomach up."  ° Toradol [Ketorolac Tromethamine] Nausea And Vomiting  ° Tramadol Nausea And Vomiting and Other (See Comments)  °  Upset stomach °Other reaction(s): Unknown  ° ° ° °Review of Systems: °Review of Systems  °Constitutional:  Negative for chills, fever and weight loss.  °Respiratory:  Negative for cough, shortness of breath and wheezing.   °Cardiovascular:  Negative for chest pain and leg swelling.  °Gastrointestinal:  Negative for abdominal pain, constipation, diarrhea, nausea and vomiting.  °Musculoskeletal:  Positive for back pain.  °Skin:   Negative for rash.  ° ° ° °OBJECTIVE: °Vitals:  ° 03/30/21 1837 03/30/21 2108 03/30/21 2147 03/31/21 0537  °BP: (!) 161/70  (!) 163/49 119/81  °Pulse: 66 80 99 90  °Resp: 15 16 20 19  °Temp: 98.7 °F (37.1 °C)  98 °F (36.7 °C) 98 °F (36.7 °C)  °TempSrc: Oral  Oral Oral  °SpO2: 99% 97% 97% 98%  °Weight:      °Height:      ° °Body mass index is 38.41 kg/m². ° °Physical Exam °Constitutional:   °   General: She is not in acute distress. °   Appearance: She is well-developed.  °Cardiovascular:  °   Rate and Rhythm: Normal rate and regular rhythm.  °   Heart sounds: Normal heart sounds.  °Pulmonary:  °   Effort: Pulmonary effort is normal.  °   Breath sounds: Normal breath sounds.  °Musculoskeletal:  °   Comments: Wound vac in place with small amount of serosanguinous drainage.   °Skin: °   General: Skin is warm and dry.  °Neurological:  °   Mental Status: She is alert and oriented to person, place, and time.  °Psychiatric:     °   Behavior: Behavior normal.     °   Thought Content: Thought content normal.     °   Judgment: Judgment normal.  ° ° °Lab Results °Lab Results  °Component Value Date  ° WBC 6.1 03/31/2021  ° HGB 8.5 (L) 03/31/2021  ° HCT 25.5 (L) 03/31/2021  ° MCV 85.9 03/31/2021  ° PLT 447 (H) 03/31/2021  °  °Lab Results  °Component Value Date  ° CREATININE 1.46 (H) 03/31/2021  ° BUN 28 (H) 03/31/2021  ° NA 140 03/31/2021  ° K 4.6 03/31/2021  ° CL 109 03/31/2021  ° CO2 23 03/31/2021  °  °Lab Results  °Component Value Date  ° ALT 9 03/30/2021  ° AST 12 (L) 03/30/2021  ° ALKPHOS 130 (H) 03/30/2021  ° BILITOT 0.3 03/30/2021  °  ° °Microbiology: °Recent Results (from the past 240 hour(s))  °Surgical pcr screen     Status: None  ° Collection Time: 03/25/21  1:19 PM  ° Specimen: Nasal Mucosa; Nasal Swab  °Result Value Ref Range Status  ° MRSA, PCR NEGATIVE NEGATIVE Final  ° Staphylococcus aureus NEGATIVE NEGATIVE Final  °  Comment: (NOTE) °The Xpert SA Assay (FDA approved for NASAL specimens in patients 22 °years of  age and older), is one component of a comprehensive °surveillance program. It is not intended to diagnose infection nor to °guide or monitor treatment. °Performed at [facility], [address] [place], [place] °[address] °  °SARS Coronavirus 2 by RT PCR (hospital order, performed in [facility] lab) Nasopharyngeal Nasopharyngeal Swab     Status: None  ° Collection Time: 03/25/21  1:19 PM  ° Specimen: Nasopharyngeal Swab  °Result Value   Ref Range Status  ° SARS Coronavirus 2 NEGATIVE NEGATIVE Final  °  Comment: (NOTE) °SARS-CoV-2 target nucleic acids are NOT DETECTED. ° °The SARS-CoV-2 RNA is generally detectable in upper and lower °respiratory specimens during the acute phase of infection. The lowest °concentration of SARS-CoV-2 viral copies this assay can detect is 250 °copies / mL. A negative result does not preclude SARS-CoV-2 infection °and should not be used as the sole basis for treatment or other °patient management decisions.  A negative result may occur with °improper specimen collection / handling, submission of specimen other °than nasopharyngeal swab, presence of viral mutation(s) within the °areas targeted by this assay, and inadequate number of viral copies °(<250 copies / mL). A negative result must be combined with clinical °observations, patient history, and epidemiological information. ° °Fact Sheet for Patients:   °https://www.fda.gov/media/136312/download ° °Fact Sheet for Healthcare Providers: °https://www.fda.gov/media/136313/download ° °This test is not yet approved or  cleared by the [place] FDA and °has been authorized for detection and/or diagnosis of SARS-CoV-2 by °FDA under an Emergency Use Authorization (EUA).  This EUA will remain °in effect (meaning this test can be used) for the duration of the °COVID-19 declaration under Section 564(b)(1) of the Act, 21 U.S.C. °section 360bbb-3(b)(1), unless the authorization is terminated or °revoked sooner. ° °Performed at  [facility], [address] [place], [place] °[address] °  °Aerobic/Anaerobic Culture w Gram Stain (surgical/deep wound)     Status: None  ° Collection Time: 03/25/21  3:58 PM  ° Specimen: PATH Other; Tissue  °Result Value Ref Range Status  ° Specimen Description WOUND  Final  ° Special Requests LUMBAR INCINSION  Final  ° Gram Stain NO WBC SEEN °NO ORGANISMS SEEN °  Final  ° Culture   Final  °  FEW ENTEROBACTER AEROGENES °FEW MORGANELLA MORGANII °RARE BACTEROIDES FRAGILIS °BETA LACTAMASE POSITIVE °Performed at [facility], [address] [place], [place] [address] °  ° Report Status 03/29/2021 FINAL  Final  ° Organism ID, Bacteria ENTEROBACTER AEROGENES  Final  ° Organism ID, Bacteria MORGANELLA MORGANII  Final  °    Susceptibility  ° Enterobacter aerogenes - MIC*  °  CEFAZOLIN >=64 RESISTANT Resistant   °  CEFEPIME <=0.12 SENSITIVE Sensitive   °  CEFTAZIDIME <=1 SENSITIVE Sensitive   °  CEFTRIAXONE <=0.25 SENSITIVE Sensitive   °  CIPROFLOXACIN <=0.25 SENSITIVE Sensitive   °  GENTAMICIN <=1 SENSITIVE Sensitive   °  IMIPENEM 1 SENSITIVE Sensitive   °  TRIMETH/SULFA <=20 SENSITIVE Sensitive   °  PIP/TAZO <=4 SENSITIVE Sensitive   °  * FEW ENTEROBACTER AEROGENES  ° Morganella morganii - MIC*  °  AMPICILLIN >=32 RESISTANT Resistant   °  CEFAZOLIN >=64 RESISTANT Resistant   °  CEFTAZIDIME <=1 SENSITIVE Sensitive   °  CIPROFLOXACIN <=0.25 SENSITIVE Sensitive   °  GENTAMICIN <=1 SENSITIVE Sensitive   °  IMIPENEM 2 SENSITIVE Sensitive   °  TRIMETH/SULFA <=20 SENSITIVE Sensitive   °  AMPICILLIN/SULBACTAM 8 SENSITIVE Sensitive   °  PIP/TAZO <=4 SENSITIVE Sensitive   °  * FEW MORGANELLA MORGANII  ° ° ° °Greg Calone, NP °[facility] °[facility] ° °03/31/2021 ° °1:05 PM ° °

## 2021-03-31 NOTE — Progress Notes (Addendum)
PHARMACY CONSULT NOTE FOR:  OUTPATIENT  PARENTERAL ANTIBIOTIC THERAPY (OPAT)  Indication: Lumbar Wound Infection Regimen:  Cefepime 2g IV q12h  Metronidazole 500 mg PO BID End date: 04/14/21  Will follow-up in clinic for further oral antibiotic plan after these first 2 weeks.  IV antibiotic discharge orders are pended. To discharging provider:  please sign these orders via discharge navigator,  Select New Orders & click on the button choice - Manage This Unsigned Work.     Thank you for allowing pharmacy to be a part of this patient's care.  Lestine Box, PharmD PGY2 Infectious Diseases Pharmacy Resident   Please check [email or web address] for unit-specific pharmacy phone numbers

## 2021-03-31 NOTE — Plan of Care (Signed)

## 2021-03-31 NOTE — Anesthesia Postprocedure Evaluation (Signed)
Anesthesia Post Note  [name]  Procedure(s) Performed: REPEAT LUMBAR WOUND DEBRIDEMENT WOUND VAC CHANGE 12x6x5     Patient location during evaluation: PACU Anesthesia Type: General Level of consciousness: awake and alert Pain management: pain level controlled Vital Signs Assessment: post-procedure vital signs reviewed and stable Respiratory status: spontaneous breathing, nonlabored ventilation, respiratory function stable and patient connected to nasal cannula oxygen Cardiovascular status: blood pressure returned to baseline and stable Postop Assessment: no apparent nausea or vomiting Anesthetic complications: no   No notable events documented.  Last Vitals:  Vitals:   03/30/21 2147 03/31/21 0537  BP: (!) 163/49 119/81  Pulse: 99 90  Resp: 20 19  Temp: 36.7 C 36.7 C  SpO2: 97% 98%    Last Pain:  Vitals:   03/31/21 0650  TempSrc:   PainSc: [place]

## 2021-03-31 NOTE — Progress Notes (Signed)
At bedside to place PICC for home antibiotics. Patient is a Stage 3 CKD. Called placed to orthopedic service for MD on call. Did not receive phone call. Secure chat with Dr. Elmarie Shiley, nephrologist, he did not want PICC placed and suggest midline. Explained that cefepime and metronidazole is not recommended through midline.Recommend IR place CVAD for outpatient antibiotics. Charge RN aware. Will continue to monitor.

## 2021-04-01 ENCOUNTER — Encounter: Payer: Self-pay | Admitting: Physical Medicine and Rehabilitation

## 2021-04-01 LAB — BASIC METABOLIC PANEL
Anion gap: 9 (ref 5–15)
BUN: 33 mg/dL — ABNORMAL HIGH (ref 8–23)
CO2: 22 mmol/L (ref 22–32)
Calcium: 9.3 mg/dL (ref 8.9–10.3)
Chloride: 111 mmol/L (ref 98–111)
Creatinine, Ser: 1.84 mg/dL — ABNORMAL HIGH (ref 0.44–1.00)
GFR, Estimated: 30 mL/min — ABNORMAL LOW (ref >60)
Glucose, Bld: 114 mg/dL — ABNORMAL HIGH (ref 70–99)
Potassium: 4.2 mmol/L (ref 3.5–5.1)
Sodium: 142 mmol/L (ref 135–145)

## 2021-04-01 LAB — CBC
HCT: 22.4 % — ABNORMAL LOW (ref 36.0–46.0)
Hemoglobin: 7.2 g/dL — ABNORMAL LOW (ref 12.0–15.0)
MCH: 27.9 pg (ref 26.0–34.0)
MCHC: 32.1 g/dL (ref 30.0–36.0)
MCV: 86.8 fL (ref 80.0–100.0)
Platelets: 367 10*3/uL (ref 150–400)
RBC: 2.58 MIL/uL — ABNORMAL LOW (ref 3.87–5.11)
RDW: 14.4 % (ref 11.5–15.5)
WBC: 6.6 10*3/uL (ref 4.0–10.5)
nRBC: 0 % (ref 0.0–0.2)

## 2021-04-01 MED ORDER — SODIUM CHLORIDE 0.9% FLUSH
10.0000 mL | Freq: Two times a day (BID) | INTRAVENOUS | Status: DC
  Administered 2021-04-01 – 2021-04-06 (×9): 10 mL

## 2021-04-01 MED ORDER — ENSURE MAX PROTEIN PO LIQD
11.0000 [oz_av] | Freq: Two times a day (BID) | ORAL | Status: DC
  Administered 2021-04-01 – 2021-04-06 (×8): 11 [oz_av] via ORAL
  Filled 2021-04-01: qty 330

## 2021-04-01 MED ORDER — JUVEN PO PACK
1.0000 | PACK | Freq: Two times a day (BID) | ORAL | Status: DC
  Administered 2021-04-02 – 2021-04-06 (×8): 1 via ORAL
  Filled 2021-04-01 (×8): qty 1

## 2021-04-01 MED ORDER — SODIUM CHLORIDE 0.9% FLUSH
10.0000 mL | INTRAVENOUS | Status: DC | PRN
  Administered 2021-04-05: 10 mL

## 2021-04-01 NOTE — Progress Notes (Addendum)
Initial Nutrition Assessment  DOCUMENTATION CODES:   Not applicable  INTERVENTION:  -Ensure Max po BID, each supplement provides 150 kcal and 30 grams of protein -plan to provide education on importance of adequate protein intake for wound healing upon follow-up --1 packet Juven BID, each packet provides 95 calories, 2.5 grams of protein (collagen), and 9.8 grams of carbohydrate (3 grams sugar); also contains 7 grams of L-arginine and L-glutamine, 300 mg vitamin C, 15 mg vitamin E, 1.2 mcg vitamin B-12, 9.5 mg zinc, 200 mg calcium, and 1.5 g  Calcium Beta-hydroxy-Beta-methylbutyrate to support wound healing  NUTRITION DIAGNOSIS:   Increased nutrient needs related to wound healing as evidenced by estimated needs.  GOAL:   Patient will meet greater than or equal to 90% of their needs  MONITOR:   PO intake, Labs, Skin  REASON FOR ASSESSMENT:   Consult Assessment of nutrition requirement/status, Wound healing  ASSESSMENT:   Pt with PMH significant for OA, HTN, gout, fibromyalgia, depression, and CKD admitted with postoperative complication of skin involving drainage from surgical wound  1/04 - s/p Lumbar postoperative incision irrigation and excisional debridement.  Reclosure and VAC placement. 1/09 - s/p 6 repeat sharp excisional debridement, VAC change.Placement of Stimulan Tobramycin beads 375mg    Discussed pt with RN.  Pt unavailable at time of RD visit. Pt undergoing procedure in room. Per H&P, pt intentionally lost weight in order to get a lumbar fusion and has continued the same diet and reports having poor po intake PTA, though duration not specified. Will attempt to obtain more detailed history at follow-up. Pt reported not feeling hungry often as of late.   PO intake since admit has been 50-100% x 7 recorded meals (79% avg meal intake). Plan to discuss importance of adequate protein intake for wound healing upon follow-up.   Weight history reviewed. No significant weight  changes noted.   UOP: 2x unmeasured occurrences x24 hours WoundVAC: 54ml x24 hours I/O: +3725ml since admit  Medications: Scheduled Meds:  allopurinol  200 mg Oral Daily   amLODipine  5 mg Oral Daily   atorvastatin  10 mg Oral QHS   docusate sodium  100 mg Oral BID   DULoxetine  30 mg Oral QHS   losartan  50 mg Oral Daily   metroNIDAZOLE  500 mg Oral Q12H   [START ON 04/02/2021] nutrition supplement (JUVEN)  1 packet Oral BID BM   pregabalin  50 mg Oral TID   Ensure Max Protein  11 oz Oral BID   sodium chloride flush  10-40 mL Intracatheter Q12H   sodium chloride flush  3 mL Intravenous Q12H   topiramate  25 mg Oral QHS  Continuous Infusions:  sodium chloride 85 mL/hr at 03/30/21 2050   ceFEPime (MAXIPIME) IV 2 g (04/01/21 1011)    Labs: Recent Labs  Lab 03/30/21 0246 03/31/21 0123 04/01/21 0212  NA 138 140 142  K 4.3 4.6 4.2  CL 108 109 111  CO2 24 23 22   BUN 32* 28* 33*  CREATININE 1.43* 1.46* 1.84*  CALCIUM 9.1 9.4 9.3  GLUCOSE 106* 168* 114*   NUTRITION - FOCUSED PHYSICAL EXAM: Unable to perform at this time. Will attempt at follow-up.  Diet Order:   Diet Order             Diet regular Room service appropriate? Yes; Fluid consistency: Thin  Diet effective now                   EDUCATION NEEDS:  No education needs have been identified at this time  Skin:  Skin Assessment: Skin Integrity Issues: Skin Integrity Issues:: Incisions Incisions: multiple incisions to back  Last BM:  1/8  Height:   Ht Readings from Last 1 Encounters:  03/25/21 5\' 2"  (1.575 m)    Weight:   Wt Readings from Last 1 Encounters:  03/25/21 95.3 kg    BMI:  Body mass index is 38.41 kg/m.  Estimated Nutritional Needs:   Kcal:  1800-2000  Protein:  115-130 grams  Fluid:  >2L     Theone Stanley., MS, RD, LDN (she/her/hers) RD pager number and weekend/on-call pager number located in [place].

## 2021-04-01 NOTE — Consult Note (Signed)
[place] Nurse wound follow up Patient receiving care in [facility] 5N31 Wound type: Lumbar surgical  Measurement: 12 x 6 x 5 Wound bed: red granulation tissue with ABX beads visible in the wound bed Drainage (amount, consistency, odor) serosanguinous in canister Periwound: intact Dressing procedure/placement/frequency: Removed black foam and surrounding Medipore tape. Cleaned the surrounding periwound and creases in the back with skin barrier wipes, placed barrier ring around the wound and in the creases to help prevent leakage. Placed 2 pieces of black foam in the wound, bridged to the right trochanter with 2 pieces of black foam, drape applied and seal obtained at 100 mmHg.  Dressing change still very painful and at this point the patient will still need IV pain meds prior to procedure.  Medium foam dressing kits ordered to be placed in the room.  WOC will follow M/W/F for vac dressing changes.  Cathlean Marseilles Tamala Julian, MSN, RN, [place], Lysle Pearl, [facility] Wound Treatment Associate Pager [phone]

## 2021-04-01 NOTE — TOC Progression Note (Signed)
Transition of Care [facility]) - Progression Note    Patient Details  Name: Amy Hooper MRN: [id number] Date of Birth: September 08, 1950  [facility]) CM/SW Contact  Sharin Mons, RN Phone Number: 04/01/2021, 7:58 AM  Clinical Narrative:    Per ID pt will require 2 weeks of  IV ABX therapy. OPAT  orders noted. PICC line vs port pending. NCM spoke with pt @ bedside regarding TOC needs. Pt agreeable to home IV ABX therapy. NCM updated Kenzie with [facility] about home infusion need. Referral made with [facility] for IV ABX therapy and accepted. Home infusion teaching will need to completed prior to d/c by [facility] / [facility]. 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 Resides with mom.  TOC team will continue to monitor and assist with TOC needs.....  Expected Discharge Plan: [place] Barriers to Discharge: Continued Medical Work up  Expected Discharge Plan and Services Expected Discharge Plan: [place]   Discharge Planning Services: CM Consult   Living arrangements for the past 2 months: Single Family Home                 DME Arranged: Other see comment (IV ABX therapy/ [facility]) DME Agency: Other - Comment ([facility]) Date DME Agency Contacted: 03/31/21 Time DME Agency Contacted: [phone] Representative spoke with at DME Agency: [place]: RN [place] Agency: [place] ([place]) Date [place]: 04/01/21 Time [place]: [phone] Representative spoke with at [place]: [place] Determinants of Health ([place]) Interventions    Readmission Risk Interventions No flowsheet data found.

## 2021-04-01 NOTE — Progress Notes (Signed)
Order placed for Midline, however per IV team this is not recommended for the Abx ordered. Paged infectious disease

## 2021-04-02 ENCOUNTER — Inpatient Hospital Stay (HOSPITAL_COMMUNITY): Payer: BC Managed Care – PPO

## 2021-04-02 IMAGING — DX DG ABDOMEN 1V
1 series · 1 of 1 positions shown · non-contrast
Comparison: [DATE]

CLINICAL DATA: Constipation.

EXAM:
ABDOMEN - 1 VIEW

[abdomen kub]
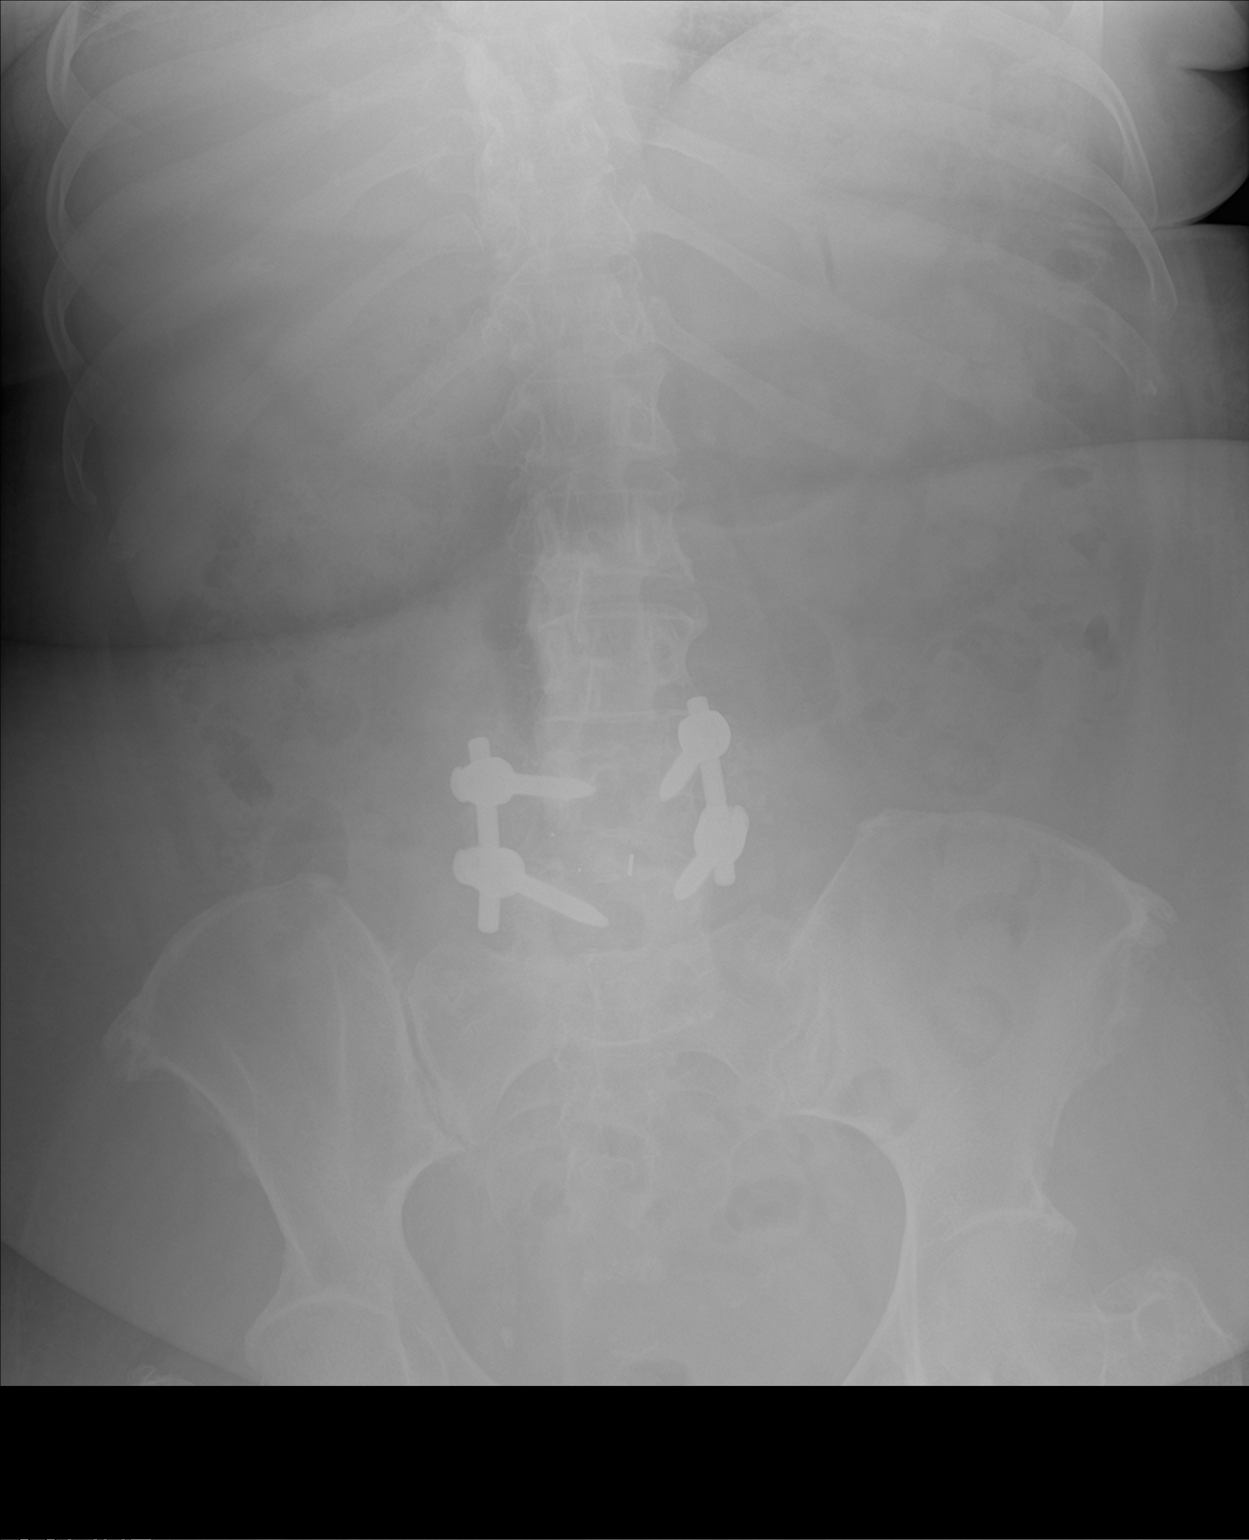

[1 of 1 positions shown; findings below may reference images not displayed]

FINDINGS: Normal bowel gas pattern. Normal amount of stool. Stable hardware
and interbody fusion at the L4-5 level with laminectomy defects.
IMPRESSION: No acute abnormality.

## 2021-04-02 NOTE — Progress Notes (Signed)
Patient placed herself on home CPAP for the night  

## 2021-04-02 NOTE — Plan of Care (Signed)

## 2021-04-02 NOTE — Progress Notes (Signed)
Mobility Specialist Criteria Algorithm Info.   04/02/21 1440  Mobility  Activity Ambulated in hall (To chair after ambulation)  Range of Motion/Exercises Active;All extremities  Level of Assistance Independent  Assistive Device None  Distance Ambulated (ft) 320 ft  Mobility Ambulated with assistance in hallway  Mobility Response Tolerated well  Mobility performed by Mobility specialist  Bed Position Chair   Patient received lying supine in bed eager to participate in mobility. Ambulated in hallway independently with steady gait. Returned to room without complaint or incident. Was left in recliner chair with all needs met, call bell in reach.   04/02/2021 4:21 PM

## 2021-04-02 NOTE — Progress Notes (Signed)
° °  Subjective: 3 Days Post-Op Procedure(s) (LRB): REPEAT LUMBAR WOUND DEBRIDEMENT (N/A) WOUND VAC CHANGE 12x6x5 (N/A) Patient reports pain as moderate.    Objective: Vital signs in last 24 hours: Temp:  [97.8 F (36.6 C)-98 F (36.7 C)] 98 F (36.7 C) (01/12 0142) Pulse Rate:  [68-69] 68 (01/12 0142) Resp:  [17-18] 17 (01/12 0142) BP: (126-161)/(63-71) 126/63 (01/12 0142) SpO2:  [98 %-100 %] 98 % (01/12 0142)  Intake/Output from previous day: 01/11 0701 - 01/12 0700 In: 1410 [P.O.:410; I.V.:1000] Out: -  Intake/Output this shift: No intake/output data recorded.  Recent Labs    03/31/21 0123 04/01/21 0212  HGB 8.5* 7.2*   Recent Labs    03/31/21 0123 04/01/21 0212  WBC 6.1 6.6  RBC 2.97* 2.58*  HCT 25.5* 22.4*  PLT 447* 367   Recent Labs    03/31/21 0123 04/01/21 0212  NA 140 142  K 4.6 4.2  CL 109 111  CO2 23 22  BUN 28* 33*  CREATININE 1.46* 1.84*  GLUCOSE 168* 114*  CALCIUM 9.4 9.3   No results for input(s): LABPT, INR in the last 72 hours.  Neurologically intact No results found.  Assessment/Plan: 3 Days Post-Op Procedure(s) (LRB): REPEAT LUMBAR WOUND DEBRIDEMENT (N/A) WOUND VAC CHANGE 12x6x5 (N/A) Up with therapy, VAC change Friday. Once able to get VAC change with PO meds will discharge and HHRN VAC changes.   [name] 04/02/2021, 7:41 AM

## 2021-04-02 NOTE — Progress Notes (Signed)
Dr. Orson Slick PA, called to inform that patient is complaining of increased nausea due to PO flagyl. Zofran already given this AM not due till 1545.  Patient requested RN contact provider to informed them about her nausea with flagyl since she will be going home on this.   Per Roxy Manns, [place] he will look into it.

## 2021-04-03 ENCOUNTER — Other Ambulatory Visit: Payer: Self-pay | Admitting: Physical Medicine and Rehabilitation

## 2021-04-03 LAB — BASIC METABOLIC PANEL
Anion gap: 3 — ABNORMAL LOW (ref 5–15)
BUN: 36 mg/dL — ABNORMAL HIGH (ref 8–23)
CO2: 24 mmol/L (ref 22–32)
Calcium: 9.4 mg/dL (ref 8.9–10.3)
Chloride: 112 mmol/L — ABNORMAL HIGH (ref 98–111)
Creatinine, Ser: 1.48 mg/dL — ABNORMAL HIGH (ref 0.44–1.00)
GFR, Estimated: 38 mL/min — ABNORMAL LOW (ref >60)
Glucose, Bld: 95 mg/dL (ref 70–99)
Potassium: 5 mmol/L (ref 3.5–5.1)
Sodium: 139 mmol/L (ref 135–145)

## 2021-04-03 MED ORDER — METRONIDAZOLE 500 MG PO TABS: 500.0000 mg | ORAL_TABLET | Freq: Two times a day (BID) | ORAL | Status: DC

## 2021-04-03 MED ORDER — LEVOFLOXACIN 500 MG PO TABS: 250.0000 mg | ORAL_TABLET | Freq: Every day | ORAL | Status: DC

## 2021-04-03 MED ORDER — PREGABALIN 75 MG PO CAPS: 75.0000 mg | ORAL_CAPSULE | Freq: Three times a day (TID) | ORAL | 3 refills | Status: DC

## 2021-04-03 NOTE — TOC Progression Note (Signed)
Transition of Care [facility]) - Progression Note    Patient Details  Name: Priscillia Fouch MRN: [id number] Date of Birth: 08-11-1950  [facility] CM/SW Contact  Sharin Mons, RN Phone Number: 04/03/2021, 12:05 PM  Clinical Narrative:    Pt with wound vac authorization. [employer] home vac and supplies delivered to pt's room by NCM. Serial # [id number].  Delivery form signed by pt. Signed form faxed to [place] @ [phone].  TOC team will continue to monitor for needs...  Expected Discharge Plan: [place] Barriers to Discharge: Continued Medical Work up  Expected Discharge Plan and Services Expected Discharge Plan: [place]   Discharge Planning Services: CM Consult   Living arrangements for the past 2 months: Single Family Home                 DME Arranged: Other see comment (IV ABX therapy/ [facility]) DME Agency: Other - Comment ([facility]) Date DME Agency Contacted: 03/31/21 Time DME Agency Contacted: [phone] Representative spoke with at DME Agency: [place]: RN [place] Agency: [place] ([place]) Date [place]: 04/01/21 Time [place]: [phone] Representative spoke with at [place]: [place] Determinants of Health ([place]) Interventions    Readmission Risk Interventions No flowsheet data found.

## 2021-04-03 NOTE — Progress Notes (Signed)
Patient ID: Amy Hooper, female   DOB: [birth year], 71 y.o.   MRN: [id number]   Subjective: 4 Days Post-Op Procedure(s) (LRB): REPEAT LUMBAR WOUND DEBRIDEMENT (N/A) WOUND VAC CHANGE 12x6x5 (N/A) Patient reports pain as moderate and severe.  Did VAC change with PO pain medication then after procedure she had to have IV Dilaudid and states she is not ready to go home.   Plan VAC change Monday by [facility] RN then discharge if she tolerates with PO pain medication.   Objective: Vital signs in last 24 hours: Temp:  [98.5 F (36.9 C)] 98.5 F (36.9 C) (01/12 2157) Pulse Rate:  [76] 76 (01/12 2157) Resp:  [16] 16 (01/12 2157) BP: (172)/(63) 172/63 (01/12 2157) SpO2:  [97 %] 97 % (01/12 2157)  Intake/Output from previous day: 01/12 0701 - 01/13 0700 In: 255 [P.O.:255] Out: -  Intake/Output this shift: No intake/output data recorded.  Recent Labs    04/01/21 0212  HGB 7.2*   Recent Labs    04/01/21 0212  WBC 6.6  RBC 2.58*  HCT 22.4*  PLT 367   Recent Labs    04/01/21 0212 04/03/21 0330  NA 142 139  K 4.2 5.0  CL 111 112*  CO2 22 24  BUN 33* 36*  CREATININE 1.84* 1.48*  GLUCOSE 114* 95  CALCIUM 9.3 9.4   No results for input(s): LABPT, INR in the last 72 hours.  Neurologically intact DG Abd 1 View  Result Date: 04/02/2021 CLINICAL DATA:  Constipation. EXAM: ABDOMEN - 1 VIEW COMPARISON:  03/09/2021 FINDINGS: Normal bowel gas pattern. Normal amount of stool. Stable hardware and interbody fusion at the L4-5 level with laminectomy defects. IMPRESSION: No acute abnormality. Electronically Signed   By: Claudie Revering M.D.   On: 04/02/2021 13:43    Assessment/Plan: 4 Days Post-Op Procedure(s) (LRB): REPEAT LUMBAR WOUND DEBRIDEMENT (N/A) WOUND VAC CHANGE 12x6x5 (N/A) Continue IV ABX , VAC change on Monday and if tolerated then discharge home and [facility] changes 3X per week starting next Wednesday.    [name] 04/03/2021, 10:42 AM

## 2021-04-03 NOTE — Plan of Care (Signed)

## 2021-04-03 NOTE — Progress Notes (Signed)
Patient complained of numbness, tingling, coldness, and pain to her right foot. Also, patient complained of pain to her right buttock. An assessment was done and patient skin was warm to the touch and patient stated she was able to feel me touching her feet. Pain medication was given. Please see MAR. MD made aware. No new orders at this time. Will continue to monitor patient.

## 2021-04-03 NOTE — Progress Notes (Signed)
Mobility Specialist Criteria Algorithm Info.    04/03/21 1600  Mobility  Activity Ambulated in hall;Ambulated to bathroom;Dangled on edge of bed  Range of Motion/Exercises Active;All extremities  Level of Assistance Independent  Distance Ambulated (ft) 320 ft  Mobility Ambulated independently in hallway  Mobility Response Tolerated well  Mobility performed by Mobility specialist  Bed Position Semi-fowlers   Patient ambulated in hallway independently with steady gait. Tolerated ambulation well without complaint or incident. Was left dangling EOB with all needs met.   04/03/2021 4:46 PM  Martinique Violet Cart, [place], [place]  GKMKT:[phone] Office: [phone]

## 2021-04-03 NOTE — Consult Note (Signed)
[place] Nurse wound follow up Patient receiving care in [facility] 5N31 Wound type: Lumbar surgical  Wound bed: red granulation tissue with ABX beads visible in the wound bed Drainage (amount, consistency, odor) serosanguinous in canister Periwound: intact Dressing procedure/placement/frequency: Removed black foam, placed barrier ring in the creases at the proximal and distal end to help prevent leakage. Placed Mepitel and 1 piece of black foam in the wound, bridged to the right trochanter with 1 piece of black foam, drape applied and seal obtained at 100 mmHg.  Patient received PO pain meds prior to dressing change and tolerated the procedure very well.   Medium foam dressing kit left at bedside.    WOC will follow M/W/F for vac dressing changes.   Cathlean Marseilles Tamala Julian, MSN, RN, [place], Lysle Pearl, [facility] Wound Treatment Associate Pager [phone]

## 2021-04-04 MED ORDER — TIZANIDINE HCL 4 MG PO TABS
2.0000 mg | ORAL_TABLET | Freq: Four times a day (QID) | ORAL | Status: DC | PRN
  Administered 2021-04-04 – 2021-04-06 (×5): 2 mg via ORAL
  Filled 2021-04-04 (×5): qty 1

## 2021-04-04 NOTE — Plan of Care (Signed)

## 2021-04-04 NOTE — Plan of Care (Signed)

## 2021-04-04 NOTE — Progress Notes (Signed)
Patient stable C/o RLE paresthesias without any focal motor or sensory deficits VAC in place Plan is to keep her through the weekend for VAC change on Monday then home

## 2021-04-04 NOTE — Progress Notes (Signed)
Patient c/o muscle pain, she states pain medication (norco and dilaudid) really does not help and the only thing that relieves her pain is the muscle relaxer. Patient request RN contact provider to change frequency of muscle relaxer.   Per PA on call, medication can be changed from every 8 to every 6. Patient made aware of the change.

## 2021-04-04 NOTE — Progress Notes (Signed)
Mobility Specialist Criteria Algorithm Info.    04/04/21 1630  Mobility  Activity Ambulated in hall (in chair to bed after)  Range of Motion/Exercises Active;All extremities  Level of Assistance Independent after set-up  Assistive Device None  Distance Ambulated (ft) 360 ft  Mobility Ambulated with assistance in hallway  Mobility Response Tolerated well  Mobility performed by Mobility specialist  Bed Position Semi-fowlers   Patient received in chair. Ambulated in hallway independently with steady gait. Tolerated ambulation well without complaint or incident. Was left lying supine in bed with all needs met.    04/04/2021 4:57 PM  Amy Hooper, [place], [place]  BOFBP:[phone] Office: [phone]

## 2021-04-05 NOTE — Progress Notes (Signed)
Patient has home CPAP at bedside within reach. Distilled H2O added to chamber per pt request. No further assistance needed from RT at this time.

## 2021-04-05 NOTE — Progress Notes (Signed)
Mobility Specialist Criteria Algorithm Info. ° ° 04/05/21 1630  °Mobility  °Activity Ambulated in hall;Dangled on edge of bed  °Range of Motion/Exercises Active;All extremities  °Level of Assistance Independent after set-up  °Distance Ambulated (ft) 320 ft  °Mobility Ambulated with assistance in hallway  °Mobility Response Tolerated well  °Mobility performed by Mobility specialist  °Bed Position Semi-fowlers  ° °Patient ambulated in hallway independently with steady gait. Tolerated ambulation well without complaint or incident. Was left dangling EOB with all needs met.  ° °04/05/2021 °5:04 PM ° °Jordan Tripp, CMS, BS EXP °Acute Rehabilitation Services  °Phone:[phone] °Office: [phone] °- °

## 2021-04-05 NOTE — Progress Notes (Signed)
Subjective: 6 Days Post-Op Procedure(s) (LRB): REPEAT LUMBAR WOUND DEBRIDEMENT (N/A) WOUND VAC CHANGE 12x6x5 (N/A) Patient reports pain as mild.    Objective: Vital signs in last 24 hours: Temp:  [98 F (36.7 C)-98.6 F (37 C)] 98.4 F (36.9 C) (01/15 0830) Pulse Rate:  [66-83] 83 (01/15 0830) Resp:  [16-18] 18 (01/15 0830) BP: (131-161)/(61-80) 161/80 (01/15 0830) SpO2:  [100 %] 100 % (01/15 0830)  Intake/Output from previous day: 01/14 0701 - 01/15 0700 In: 255 [P.O.:255] Out: 250 [Urine:250] Intake/Output this shift: No intake/output data recorded.  No results for input(s): HGB in the last 72 hours. No results for input(s): WBC, RBC, HCT, PLT in the last 72 hours. Recent Labs    04/03/21 0330  NA 139  K 5.0  CL 112*  CO2 24  BUN 36*  CREATININE 1.48*  GLUCOSE 95  CALCIUM 9.4   No results for input(s): LABPT, INR in the last 72 hours.  Neurologically intact Neurovascular intact Sensation intact distally Intact pulses distally Dorsiflexion/Plantar flexion intact Incision: wound vac in place and functioning.  250 bloody drainage in canister No cellulitis present Compartment soft   Assessment/Plan: 6 Days Post-Op Procedure(s) (LRB): REPEAT LUMBAR WOUND DEBRIDEMENT (N/A) WOUND VAC CHANGE 12x6x5 (N/A) Up with therapy Plan for discharge tomorrow after vac change Continue iv abx via picc line      Amy Hooper 04/05/2021, 9:32 AM

## 2021-04-05 NOTE — Plan of Care (Signed)
  Problem: Education: Goal: Knowledge of General Education information will improve Description Including pain rating scale, medication(s)/side effects and non-pharmacologic comfort measures Outcome: Progressing   Problem: Health Behavior/Discharge Planning: Goal: Ability to manage health-related needs will improve Outcome: Progressing   Problem: Clinical Measurements: Goal: Ability to maintain clinical measurements within normal limits will improve Outcome: Progressing Goal: Will remain free from infection Outcome: Progressing   Problem: Activity: Goal: Risk for activity intolerance will decrease Outcome: Progressing   Problem: Nutrition: Goal: Adequate nutrition will be maintained Outcome: Progressing   Problem: Coping: Goal: Level of anxiety will decrease Outcome: Progressing   Problem: Elimination: Goal: Will not experience complications related to bowel motility Outcome: Progressing   Problem: Pain Managment: Goal: General experience of comfort will improve Outcome: Progressing   Problem: Safety: Goal: Ability to remain free from injury will improve Outcome: Progressing   Problem: Skin Integrity: Goal: Risk for impaired skin integrity will decrease Outcome: Progressing   

## 2021-04-05 NOTE — Progress Notes (Signed)
Mobility Specialist Criteria Algorithm Info.    04/05/21 1030  Mobility  Activity Ambulated in hall (In chair before and after)  Range of Motion/Exercises Active;All extremities  Level of Assistance Independent after set-up  Assistive Device None  Distance Ambulated (ft) 320 ft  Mobility Ambulated independently in hallway  Mobility Response Tolerated well  Mobility performed by Mobility specialist  Bed Position Chair   Patient received in chair eager to participate. Ambulated in hallway independently with steady gait. Tolerated ambulation well without complaint or incident. Was left lying supine in bed with all needs met.   04/05/2021 10:30 AM  Martin[place] Thula Stewart, [place], [place]  JGZQJ:[phone] Office: [phone]

## 2021-04-06 ENCOUNTER — Telehealth: Payer: Self-pay

## 2021-04-06 MED ORDER — METOCLOPRAMIDE HCL 5 MG PO TABS
5.0000 mg | ORAL_TABLET | Freq: Three times a day (TID) | ORAL | 1 refills | Status: DC | PRN
Start: 2021-04-06 — End: 2021-08-26

## 2021-04-06 NOTE — Progress Notes (Signed)
Discharged with [facility] to home. Alert and oriented X4. Verbalized understanding of discharge instructions.

## 2021-04-06 NOTE — Plan of Care (Signed)
°  Problem: Education: Goal: Knowledge of General Education information will improve Description: Including pain rating scale, medication(s)/side effects and non-pharmacologic comfort measures Outcome: Adequate for Discharge   Problem: Health Behavior/Discharge Planning: Goal: Ability to manage health-related needs will improve Outcome: Adequate for Discharge   Problem: Clinical Measurements: Goal: Ability to maintain clinical measurements within normal limits will improve Outcome: Adequate for Discharge Goal: Will remain free from infection Outcome: Adequate for Discharge Goal: Diagnostic test results will improve Outcome: Adequate for Discharge Goal: Respiratory complications will improve Outcome: Adequate for Discharge Goal: Cardiovascular complication will be avoided Outcome: Adequate for Discharge   Problem: Clinical Measurements: Goal: Will remain free from infection Outcome: Adequate for Discharge   Problem: Clinical Measurements: Goal: Diagnostic test results will improve Outcome: Adequate for Discharge   Problem: Clinical Measurements: Goal: Respiratory complications will improve Outcome: Adequate for Discharge   Problem: Clinical Measurements: Goal: Cardiovascular complication will be avoided Outcome: Adequate for Discharge   Problem: Nutrition: Goal: Adequate nutrition will be maintained Outcome: Adequate for Discharge   Problem: Coping: Goal: Level of anxiety will decrease Outcome: Adequate for Discharge   Problem: Elimination: Goal: Will not experience complications related to bowel motility Outcome: Adequate for Discharge Goal: Will not experience complications related to urinary retention Outcome: Adequate for Discharge   Problem: Safety: Goal: Ability to remain free from injury will improve Outcome: Adequate for Discharge   Problem: Pain Managment: Goal: General experience of comfort will improve Outcome: Adequate for Discharge   Problem: Skin  Integrity: Goal: Risk for impaired skin integrity will decrease Outcome: Adequate for Discharge   Problem: Increased Nutrient Needs (NI-5.1) Goal: Food and/or nutrient delivery Description: Individualized approach for food/nutrient provision. Outcome: Adequate for Discharge

## 2021-04-06 NOTE — Consult Note (Signed)
[place] Nurse wound follow up Patient receiving care in [facility] 5N31 Wound type: Lumbar surgical  Wound bed: red granulation tissue with ABX beads visible in the wound bed Drainage (amount, consistency, odor) serosanguinous in canister Periwound: intact Dressing procedure/placement/frequency: Removed black foam. Placed Mepitel and 2 piece of black foam in the wound, bridged to the right trochanter with 1 piece of black foam, drape applied and seal obtained at 100 mmHg.  Patient received PO pain meds prior to dressing change and tolerated the procedure very well.   Medium foam dressing kit left at bedside.    Patient discharging home today with home vac and [facility] for MWF dressing changes.    Cathlean Marseilles Tamala Julian, MSN, RN, [place], Lysle Pearl, [facility] Wound Treatment Associate Pager [phone]

## 2021-04-06 NOTE — TOC Transition Note (Signed)
Transition of Care [facility]) - CM/SW Discharge Note   Patient Details  Name: Amy Hooper MRN: [id number] Date of Birth: 09/07/1950  [facility]) CM/SW Contact:  Bartholomew Crews, RN Phone Number: [phone] 04/06/2021, 10:21 AM   Clinical Narrative:     Patient to transition home today. Spoke with Pam at [employer] to follow up with home IV antibiotics and with Ramond Marrow at [facility]. Patient to have wound vac changed prior to transition home today. No further TOC needs identified at this time.   Final next level of care: Home w Home Health Services Barriers to Discharge: No Barriers Identified   Patient Goals and CMS Choice     Choice offered to / list presented to : Patient  Discharge Placement                       Discharge Plan and Services   Discharge Planning Services: CM Consult            DME Arranged: Other see comment (IV ABX therapy/ [facility]) DME Agency: Other - Comment ([facility]) Date DME Agency Contacted: 03/31/21 Time DME Agency Contacted: [phone] Representative spoke with at DME Agency: [place]: RN [place] Agency: [place] ([place]) Date [place]: 04/06/21 Time [place]: 1021 Representative spoke with at [place]: [place] Determinants of Health ([place]) Interventions     Readmission Risk Interventions No flowsheet data found.

## 2021-04-06 NOTE — Telephone Encounter (Signed)
PA for Pregabalin sent to insurance

## 2021-04-06 NOTE — Discharge Instructions (Signed)
Nurse will be coming to your house to change the VAC 3 times a week.  He can see Dr. Lorin Mercy in the middle of next week call for appointment.  You have appointment with infectious disease follow-up.

## 2021-04-07 ENCOUNTER — Other Ambulatory Visit: Payer: Self-pay | Admitting: Physical Medicine and Rehabilitation

## 2021-04-07 NOTE — Telephone Encounter (Signed)
Pregabalin was approved.   PA Case: [id number], Status: Approved, Coverage Starts on: 04/07/2021 12:00:00 AM, Coverage Ends on: 04/07/2022 12:00:00 AM.

## 2021-04-08 NOTE — Telephone Encounter (Signed)
noted 

## 2021-04-09 ENCOUNTER — Telehealth: Payer: Self-pay

## 2021-04-09 NOTE — Telephone Encounter (Signed)
The patient called in to state there was a dressing missing and it hampered her wound vac dressing changes.  Called [facility] who is her home Health provider, to find out if they knew about the dressing issue. Corene Cornea from [employer] is [employer], minerals for more understanding and will call this RNCM back

## 2021-04-10 ENCOUNTER — Encounter: Payer: Self-pay | Admitting: Internal Medicine

## 2021-04-10 ENCOUNTER — Encounter: Payer: Self-pay | Admitting: Rheumatology

## 2021-04-14 ENCOUNTER — Encounter: Payer: Self-pay | Admitting: Internal Medicine

## 2021-04-14 ENCOUNTER — Other Ambulatory Visit: Payer: Self-pay

## 2021-04-14 ENCOUNTER — Ambulatory Visit: Payer: BC Managed Care – PPO | Admitting: Internal Medicine

## 2021-04-14 VITALS — BP 115/68 | HR 71 | Temp 98.2°F | Ht 62.0 in | Wt 218.0 lb

## 2021-04-14 DIAGNOSIS — M48061 Spinal stenosis, lumbar region without neurogenic claudication: Secondary | ICD-10-CM

## 2021-04-14 DIAGNOSIS — T8189XS Other complications of procedures, not elsewhere classified, sequela: Secondary | ICD-10-CM | POA: Diagnosis not present

## 2021-04-14 MED ORDER — LEVOFLOXACIN 750 MG PO TABS: 750.0000 mg | ORAL_TABLET | ORAL | 0 refills | Status: AC

## 2021-04-14 MED ORDER — METRONIDAZOLE 500 MG PO TABS: 500.0000 mg | ORAL_TABLET | Freq: Two times a day (BID) | ORAL | 0 refills | Status: AC

## 2021-04-14 NOTE — Progress Notes (Signed)
[name]  DOB: [birth year] MRN: [id number] PCP: Isaac Bliss, [name], MD      Patient Active Problem List   Diagnosis Date Noted   Acute on chronic renal insufficiency 03/27/2021   Postoperative complication of skin involving drainage from surgical wound 03/25/2021   Post op infection 03/24/2021   S/P lumbar fusion 03/24/2021   Lumbar stenosis 03/09/2021   Degenerative spondylolisthesis 02/19/2021   Primary osteoarthritis of left knee 09/08/2020   DJD (degenerative joint disease) of knee 09/08/2020   Status post total left knee replacement 09/08/2020   Spondylosis without myelopathy or radiculopathy, lumbar region 07/17/2020   Chronic pain syndrome 07/17/2020   Myalgia 07/17/2020   OSA (obstructive sleep apnea) 05/19/2020   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/17/2020   Sleep disturbance 04/17/2020   Chronic pain of both shoulders 03/03/2020   Pain in both hands 03/03/2020   Primary osteoarthritis of both knees 03/03/2020   Pain in both feet 03/03/2020   History of chronic kidney disease 03/03/2020   DDD (degenerative disc disease), lumbar 02/05/2020   Vitamin D deficiency 01/09/2020   Hyperlipidemia 01/09/2020   CKD (chronic kidney disease) stage 3, GFR 30-59 ml/min ([place]) 01/09/2020   HTN (hypertension)    Morbid obesity (HCC)    Gout    Fibromyalgia    Depression    Insomnia      Subjective:  71 year old female with obesity, CKD, L4-L5 stenosis anterolisthesis status post L4-5 transforaminal lumbar interbody fusion with pedicle instrumentation cage lateral fusion right L5-S1 microdiscectomy on 03/09/2021.  Patient developed drainage from surgical site infection hospitalized underwent I&D(03/25/21) of lumbar incision.  OR cultures positive for Enterobacter aeruginosa, Morganella and Bacteroides fragilis.  She underwent repeat debridement in the OR due to purulent drainage on 03/30/21. Patient was initially on cefazolin transitioned  to cefpime then metronidazole added.  Plan was to complete 2 weeks of IV therapy with cefepime and metronidazole (04/14/21) then transition to levaquin and metro to complete 8 weeks of antibiotics from OR on 03/30/21. She does have retained hardware as suuch will need suppressive antibiotics with metro and levaquin.  Today: She reports feeling well. Denies fever, chills, diarrhea.    Antibiotics: 03/27/21-1/24 cefepime 1/8-1/24 Metronidazole 1/4-1/6 -Cefazolin  Cx: 03/25/21: enterobacter aeruginosa,Morganella morganii,  bacteroides fragilis(beta lactamase positive) Review of Systems  All other systems reviewed and are negative.  Past Medical History:  Diagnosis Date   Chronic kidney disease    Depression    Fibromyalgia    Gout    HTN (hypertension)    Insomnia    Morbid obesity (HCC)    Osteoarthritis    Palpitation    Sleep apnea     Outpatient Medications Prior to Visit  Medication Sig Dispense Refill   allopurinol (ZYLOPRIM) 100 MG tablet TAKE 2 TABLETS BY MOUTH EVERY DAY 180 tablet 1   amLODipine (NORVASC) 5 MG tablet TAKE 1 TABLET (5 MG TOTAL) BY MOUTH DAILY. 30 tablet 5   atorvastatin (LIPITOR) 10 MG tablet TAKE 1 TABLET BY MOUTH EVERYDAY AT BEDTIME 30 tablet 5   diazepam (VALIUM) 5 MG tablet Take 1 by mouth 1 hour  pre-procedure with very light food. May bring 2nd tablet to appointment. 2 tablet 0   DULoxetine (CYMBALTA) 30 MG capsule TAKE 1 CAPSULE BY MOUTH EVERY DAY (Patient taking differently: 30 mg at bedtime.) 90 capsule 1   eszopiclone (LUNESTA) 2 MG TABS tablet TAKE 1 TABLET BY MOUTH AT NIGHT IMMEDIATELY BEFORE BEDTIME 30 tablet 1  fluticasone (FLONASE) 50 MCG/ACT nasal spray Place 1 spray into both nostrils daily as needed for allergies.     HYDROcodone-acetaminophen (NORCO) 7.5-325 MG tablet Take 1 tablet by mouth every 4 (four) hours as needed for moderate pain. 40 tablet 0   hydroxychloroquine (PLAQUENIL) 200 MG tablet Take 1 tablet (200 mg total) by mouth daily. 90  tablet 0   loratadine (CLARITIN) 10 MG tablet Take 10 mg by mouth daily as needed for allergies.     losartan (COZAAR) 50 MG tablet TAKE 1 TABLET BY MOUTH EVERY DAY 30 tablet 5   metoCLOPramide (REGLAN) 5 MG tablet Take 1-2 tablets (5-10 mg total) by mouth every 8 (eight) hours as needed for nausea (if ondansetron (ZOFRAN) ineffective.). 50 tablet 1   ondansetron (ZOFRAN) 4 MG tablet Take 1 tablet (4 mg total) by mouth every 8 (eight) hours as needed for nausea or vomiting. 20 tablet 0   predniSONE (DELTASONE) 10 MG tablet Take 10 mg by mouth as needed.     pregabalin (LYRICA) 75 MG capsule Take 1 capsule (75 mg total) by mouth 3 (three) times daily. 90 capsule 3   tiZANidine (ZANAFLEX) 4 MG tablet Take 1 tablet (4 mg total) by mouth every 8 (eight) hours as needed for muscle spasms. 60 tablet 0   No facility-administered medications prior to visit.     Allergies  Allergen Reactions   Cefuroxime Axetil Rash and Hives   Clarithromycin Rash and Hives    Other reaction(s): Unknown   Nsaids Nausea And Vomiting    Other reaction(s): Unknown   Cefuroxime     Other reaction(s): Unknown   Oxycodone Other (See Comments)    hallucinations   Oxycodone-Acetaminophen Other (See Comments)    Hallucination Other reaction(s): Unknown   Robaxin [Methocarbamol] Nausea And Vomiting    "It tears my stomach up."   Toradol [Ketorolac Tromethamine] Nausea And Vomiting   Tramadol Nausea And Vomiting and Other (See Comments)    Upset stomach Other reaction(s): Unknown    Social History   Tobacco Use   Smoking status: Never   Smokeless tobacco: Never  Vaping Use   Vaping Use: Never used  Substance Use Topics   Alcohol use: Never    Alcohol/week: 0.0 standard drinks   Drug use: Never    Family History  Problem Relation Age of Onset   Hypothyroidism Mother    Hyperlipidemia Other    Kidney failure Father    Dementia Father    Gout Father    Arthritis Father    Prostate cancer Father      Objective:  There were no vitals filed for this visit. There is no height or weight on file to calculate BMI.  Physical Exam Constitutional:      Appearance: Normal appearance.  HENT:     Head: Normocephalic and atraumatic.     Right Ear: Tympanic membrane normal.     Left Ear: Tympanic membrane normal.     Nose: Nose normal.     Mouth/Throat:     Mouth: Mucous membranes are moist.  Eyes:     Extraocular Movements: Extraocular movements intact.     Conjunctiva/sclera: Conjunctivae normal.     Pupils: Pupils are equal, round, and reactive to light.  Cardiovascular:     Rate and Rhythm: Normal rate and regular rhythm.     Heart sounds: No murmur heard.   No friction rub. No gallop.  Pulmonary:     Effort: Pulmonary effort is normal.  Breath sounds: Normal breath sounds.  Abdominal:     General: Abdomen is flat.     Palpations: Abdomen is soft.  Musculoskeletal:     Comments: Brace in palce  Skin:    General: Skin is warm and dry.  Neurological:     General: No focal deficit present.     Mental Status: She is alert and oriented to person, place, and time.  Psychiatric:        Mood and Affect: Mood normal.    Lab Results: Lab Results  Component Value Date   WBC 6.6 04/01/2021   HGB 7.2 (L) 04/01/2021   HCT 22.4 (L) 04/01/2021   MCV 86.8 04/01/2021   PLT 367 04/01/2021    Lab Results  Component Value Date   CREATININE 1.48 (H) 04/03/2021   BUN 36 (H) 04/03/2021   NA 139 04/03/2021   K 5.0 04/03/2021   CL 112 (H) 04/03/2021   CO2 24 04/03/2021    Lab Results  Component Value Date   ALT 9 03/30/2021   AST 12 (L) 03/30/2021   ALKPHOS 130 (H) 03/30/2021   BILITOT 0.3 03/30/2021     Assessment & Plan:  #Lumbar wound infection with retained hardware SP I&D on 03/25/21  #OR Cx form 03/25/21 #CKD Stage III Plan: -D/C cefepime. Pull midline.  -Start levaquin 774m PO q48h(renally dosed) and continue metronidazole 5034mPO bid(EOT 05/25/21). Transition to  suppressive metronidazole and levaquin(2507mO daily ->renally dosed) following 8 weeks of antibiotics.  -Labs: cbc, cmp, esr crp -Follow-up with ID in 5 weeks -Follow-up scheduled with Dr. YatAmes Coupen 04/24/21    MayLaurice RecordD RegStewardr Infectious Disease Con[place]fieldoup   04/14/21  10:14 AM

## 2021-04-14 NOTE — Progress Notes (Signed)
Per verbal order from Dr. Candiss Norse, 8 cm midline removed from left basilic, tip intact. No sutures present. RN confirmed length per chart. Dressing was clean and dry. Insertion site cleaned with CHG. Petroleum dressing applied. Patient advised no heavy lifting with this arm and to leave dressing in place for 24 hours and not to shower affected arm for 24 hours. Advised patient to seek emergency medical care if dressing becomes soaked with blood, swelling, or sharp pain presents. Advised patient to seek emergent care if develops neurological symptoms, chest pain, or shortness of breath. Instructed patient to notify office if they notice redness, warmth, or drainage at the site. Patient verbalized understanding and agreement. RN answered patient's questions. Patient tolerated procedure well and RN walked patient to check out. RN notified [facility] of removal.   Beryle Flock, RN

## 2021-04-15 ENCOUNTER — Other Ambulatory Visit: Payer: Self-pay | Admitting: Orthopaedic Surgery

## 2021-04-15 LAB — CBC WITH DIFFERENTIAL/PLATELET
Absolute Monocytes: 515 cells/uL (ref 200–950)
Basophils Absolute: 31 cells/uL (ref 0–200)
Basophils Relative: 0.7 %
Eosinophils Absolute: 268 cells/uL (ref 15–500)
Eosinophils Relative: 6.1 %
HCT: 28.5 % — ABNORMAL LOW (ref 35.0–45.0)
Hemoglobin: 9 g/dL — ABNORMAL LOW (ref 11.7–15.5)
Lymphs Abs: 1113 cells/uL (ref 850–3900)
MCH: 27.5 pg (ref 27.0–33.0)
MCHC: 31.6 g/dL — ABNORMAL LOW (ref 32.0–36.0)
MCV: 87.2 fL (ref 80.0–100.0)
MPV: 10.3 fL (ref 7.5–12.5)
Monocytes Relative: 11.7 %
Neutro Abs: 2473 cells/uL (ref 1500–7800)
Neutrophils Relative %: 56.2 %
Platelets: 356 10*3/uL (ref 140–400)
RBC: 3.27 10*6/uL — ABNORMAL LOW (ref 3.80–5.10)
RDW: 14.5 % (ref 11.0–15.0)
Total Lymphocyte: 25.3 %
WBC: 4.4 10*3/uL (ref 3.8–10.8)

## 2021-04-15 LAB — COMPLETE METABOLIC PANEL WITH GFR
AG Ratio: 1.4 (calc) (ref 1.0–2.5)
ALT: 8 U/L (ref 6–29)
AST: 14 U/L (ref 10–35)
Albumin: 3.9 g/dL (ref 3.6–5.1)
Alkaline phosphatase (APISO): 114 U/L (ref 37–153)
BUN/Creatinine Ratio: 18 (calc) (ref 6–22)
BUN: 28 mg/dL — ABNORMAL HIGH (ref 7–25)
CO2: 26 mmol/L (ref 20–32)
Calcium: 9.4 mg/dL (ref 8.6–10.4)
Chloride: 109 mmol/L (ref 98–110)
Creat: 1.52 mg/dL — ABNORMAL HIGH (ref 0.50–1.05)
Globulin: 2.8 g/dL (calc) (ref 1.9–3.7)
Glucose, Bld: 85 mg/dL (ref 65–99)
Potassium: 4.5 mmol/L (ref 3.5–5.3)
Sodium: 139 mmol/L (ref 135–146)
Total Bilirubin: 0.3 mg/dL (ref 0.2–1.2)
Total Protein: 6.7 g/dL (ref 6.1–8.1)
eGFR: 37 mL/min/{1.73_m2} — ABNORMAL LOW (ref >=60)

## 2021-04-15 LAB — SEDIMENTATION RATE: Sed Rate: 77 mm/h — ABNORMAL HIGH (ref 0–30)

## 2021-04-15 LAB — C-REACTIVE PROTEIN: CRP: 7.6 mg/L (ref <8.0)

## 2021-04-15 MED ORDER — ONDANSETRON HCL 4 MG PO TABS: 4.0000 mg | ORAL_TABLET | Freq: Three times a day (TID) | ORAL | 0 refills | Status: DC | PRN

## 2021-04-15 MED ORDER — HYDROCODONE-ACETAMINOPHEN 7.5-325 MG PO TABS: 1.0000 | ORAL_TABLET | Freq: Two times a day (BID) | ORAL | 0 refills | Status: DC | PRN

## 2021-04-15 NOTE — Telephone Encounter (Signed)
noted 

## 2021-04-20 ENCOUNTER — Other Ambulatory Visit: Payer: Self-pay | Admitting: Internal Medicine

## 2021-04-20 DIAGNOSIS — I10 Essential (primary) hypertension: Secondary | ICD-10-CM

## 2021-04-20 NOTE — Discharge Summary (Signed)
Patient ID: Amy Hooper MRN: [id number] DOB/AGE: 1950/07/07 71 y.o.  Admit date: 03/25/2021 Discharge date: 04/06/2021  Admission Diagnoses:  Principal Problem:   Postoperative complication of skin involving drainage from surgical wound Active Problems:   Acute on chronic renal insufficiency   Discharge Diagnoses:  Principal Problem:   Postoperative complication of skin involving drainage from surgical wound Active Problems:   Acute on chronic renal insufficiency  status post Procedure(s): REPEAT LUMBAR WOUND DEBRIDEMENT WOUND VAC CHANGE 12x6x5  Past Medical History:  Diagnosis Date   Chronic kidney disease    Depression    Fibromyalgia    Gout    HTN (hypertension)    Insomnia    Morbid obesity ([place])    Osteoarthritis    Palpitation    Sleep apnea     Surgeries: Procedure(s): REPEAT LUMBAR WOUND DEBRIDEMENT WOUND VAC CHANGE 12x6x5 on 03/30/2021   Consultants:   Discharged Condition: Improved  Hospital Course: [name] is an 71 y.o. female who was admitted 03/25/2021 for operative treatment of Postoperative complication of skin involving drainage from surgical wound. Patient failed conservative treatments (please see the history and physical for the specifics) and had severe unremitting pain that affects sleep, daily activities and work/hobbies. After pre-op clearance, the patient was taken to the operating room on 03/30/2021 and underwent  Procedure(s): REPEAT LUMBAR WOUND DEBRIDEMENT WOUND VAC CHANGE 12x6x5.    Patient was given perioperative antibiotics:  Anti-infectives (From admission, onward)    Start     Dose/Rate Route Frequency Ordered Stop   04/15/21 1000  levofloxacin (LEVAQUIN) tablet 250 mg  Status:  Discontinued       See Hyperspace for full Linked Orders Report.   250 mg Oral Daily 04/03/21 1413 04/06/21 1920   04/15/21 1000  metroNIDAZOLE (FLAGYL) tablet 500 mg  Status:  Discontinued       See Hyperspace for full Linked Orders  Report.   500 mg Oral Every 12 hours 04/03/21 1413 04/06/21 1920   03/31/21 0000  ceFEPime (MAXIPIME) IVPB        2 g Intravenous Every 12 hours 03/31/21 1312 04/13/21 2359   03/31/21 0000  metroNIDAZOLE (FLAGYL) 500 MG tablet        500 mg Oral 2 times daily 03/31/21 1312 04/13/21 2359   03/30/21 1648  tobramycin (NEBCIN) injection  Status:  Discontinued          As needed 03/30/21 1649 03/30/21 1718   03/29/21 1500  metroNIDAZOLE (FLAGYL) tablet 500 mg  Status:  Discontinued        500 mg Oral Every 12 hours 03/29/21 1408 04/06/21 1920   03/27/21 1330  ceFEPIme (MAXIPIME) 2 g in sodium chloride 0.9 % 100 mL IVPB  Status:  Discontinued        2 g 200 mL/hr over 30 Minutes Intravenous Every 12 hours 03/27/21 1237 04/06/21 1920   03/26/21 0000  ceFAZolin (ANCEF) IVPB 1 g/50 mL premix  Status:  Discontinued        1 g 100 mL/hr over 30 Minutes Intravenous Every 8 hours 03/25/21 1745 03/27/21 1237        Patient was given sequential compression devices and early ambulation to prevent DVT.   Patient benefited maximally from hospital stay and there were no complications. At the time of discharge, the patient was urinating/moving their bowels without difficulty, tolerating a regular diet, pain is controlled with oral pain medications and they have been cleared by PT/OT.   Recent vital  signs: No data found.   Recent laboratory studies: No results for input(s): WBC, HGB, HCT, PLT, NA, K, CL, CO2, BUN, CREATININE, GLUCOSE, INR, CALCIUM in the last 72 hours.  Invalid input(s): PT, 2   Discharge Medications:   Allergies as of 04/06/2021       Reactions   Cefuroxime Axetil Rash, Hives   Clarithromycin Rash, Hives   Other reaction(s): Unknown   Nsaids Nausea And Vomiting   Other reaction(s): Unknown   Cefuroxime    Other reaction(s): Unknown   Oxycodone Other (See Comments)   hallucinations   Oxycodone-acetaminophen Other (See Comments)   Hallucination Other reaction(s): Unknown    Robaxin [methocarbamol] Nausea And Vomiting   "It tears my stomach up."   Toradol [ketorolac Tromethamine] Nausea And Vomiting   Tramadol Nausea And Vomiting, Other (See Comments)   Upset stomach Other reaction(s): Unknown        Medication List     TAKE these medications    allopurinol 100 MG tablet Commonly known as: ZYLOPRIM TAKE 2 TABLETS BY MOUTH EVERY DAY   amLODipine 5 MG tablet Commonly known as: NORVASC TAKE 1 TABLET (5 MG TOTAL) BY MOUTH DAILY.   atorvastatin 10 MG tablet Commonly known as: LIPITOR TAKE 1 TABLET BY MOUTH EVERYDAY AT BEDTIME   diazepam 5 MG tablet Commonly known as: VALIUM Take 1 by mouth 1 hour  pre-procedure with very light food. May bring 2nd tablet to appointment.   DULoxetine 30 MG capsule Commonly known as: CYMBALTA TAKE 1 CAPSULE BY MOUTH EVERY DAY What changed:  how much to take how to take this when to take this   eszopiclone 2 MG Tabs tablet Commonly known as: LUNESTA TAKE 1 TABLET BY MOUTH AT NIGHT IMMEDIATELY BEFORE BEDTIME   fluticasone 50 MCG/ACT nasal spray Commonly known as: FLONASE Place 1 spray into both nostrils daily as needed for allergies.   HYDROcodone-acetaminophen 7.5-325 MG tablet Commonly known as: NORCO Take 1 tablet by mouth every 4 (four) hours as needed for moderate pain.   hydroxychloroquine 200 MG tablet Commonly known as: Plaquenil Take 1 tablet (200 mg total) by mouth daily.   loratadine 10 MG tablet Commonly known as: CLARITIN Take 10 mg by mouth daily as needed for allergies.   losartan 50 MG tablet Commonly known as: COZAAR TAKE 1 TABLET BY MOUTH EVERY DAY   metoCLOPramide 5 MG tablet Commonly known as: REGLAN Take 1-2 tablets (5-10 mg total) by mouth every 8 (eight) hours as needed for nausea (if ondansetron (ZOFRAN) ineffective.).   ondansetron 4 MG tablet Commonly known as: Zofran Take 1 tablet (4 mg total) by mouth every 8 (eight) hours as needed for nausea or vomiting.    predniSONE 10 MG tablet Commonly known as: DELTASONE Take 10 mg by mouth as needed.   pregabalin 75 MG capsule Commonly known as: Lyrica Take 1 capsule (75 mg total) by mouth 3 (three) times daily.   tiZANidine 4 MG tablet Commonly known as: Zanaflex Take 1 tablet (4 mg total) by mouth every 8 (eight) hours as needed for muscle spasms.       ASK your doctor about these medications    ceFEPime  IVPB Commonly known as: MAXIPIME Inject 2 g into the vein every 12 (twelve) hours for 13 days. Indication:  Wound Infection First Dose: Yes Last Day of Therapy:  04/13/21 Labs - Once weekly:  CBC/D and BMP, Labs - Every other week:  ESR and CRP Method of administration: IV Push Method of  administration may be changed at the discretion of home infusion pharmacist based upon assessment of the patient and/or caregiver's ability to self-administer the medication ordered. Ask about: Should I take this medication?   metroNIDAZOLE 500 MG tablet Commonly known as: Flagyl Take 1 tablet (500 mg total) by mouth 2 (two) times daily for 13 days. Ask about: Should I take this medication?               Discharge Care Instructions  (From admission, onward)           Start     Ordered   03/31/21 0000  Change dressing on IV access line weekly and PRN  (Home infusion instructions - [facility] )        03/31/21 1312            Diagnostic Studies: DG Abd 1 View  Result Date: 04/02/2021 CLINICAL DATA:  Constipation. EXAM: ABDOMEN - 1 VIEW COMPARISON:  03/09/2021 FINDINGS: Normal bowel gas pattern. Normal amount of stool. Stable hardware and interbody fusion at the L4-5 level with laminectomy defects. IMPRESSION: No acute abnormality. Electronically Signed   By: Claudie Revering M.D.   On: 04/02/2021 13:43   [place] EKG SITE RITE  Result Date: 03/31/2021 If Site Rite image not attached, placement could not be confirmed due to current cardiac rhythm.   Discharge Instructions      [facility] pharmacist to adjust dose for Vancomycin, Aminoglycosides and other anti-infective therapies as requested by physician.   Complete by: As directed    [facility] to provide Cath Flo 52m   Complete by: As directed    Administer for PICC line occlusion and as ordered by physician for other access device issues.   Anaphylaxis Kit: Provided to treat any anaphylactic reaction to the medication being provided to the patient if First Dose or when requested by physician   Complete by: As directed    Epinephrine 140mml vial / amp: Administer 0.71m77m0.71ml29mubcutaneously once for moderate to severe anaphylaxis, nurse to call physician and pharmacy when reaction occurs and call 911 if needed for immediate care   Diphenhydramine 50mg45mIV vial: Administer 25-50mg 12mM PRN for first dose reaction, rash, itching, mild reaction, nurse to call physician and pharmacy when reaction occurs   Sodium Chloride 0.9% NS 500ml I61mdminister if needed for hypovolemic blood pressure drop or as ordered by physician after call to physician with anaphylactic reaction   Change dressing on IV access line weekly and PRN   Complete by: As directed    Flush IV access with Sodium Chloride 0.9% and Heparin 10 units/ml or 100 units/ml   Complete by: As directed    Home infusion instructions - [facility]   Complete by: As directed    Instructions: Flush IV access with Sodium Chloride 0.9% and Heparin 10units/ml or 100units/ml   Change dressing on IV access line: Weekly and PRN   Instructions Cath Flo 2mg: Ad471mister for PICC Line occlusion and as ordered by physician for other access device   [facility] pharmacist to adjust dose for: Vancomycin, Aminoglycosides and other anti-infective therapies as requested by physician   Incentive spirometry RT   Complete by: As directed    Method of administration may be changed at the discretion of home infusion pharmacist based upon  assessment of the patient and/or caregivers ability to self-administer the medication ordered   Complete by: As directed  Follow-up Information     Isaac Bliss, [name], MD Follow up.   Specialty: Internal Medicine Contact information: [place] [address] [place] [facility] up.   Why: [facility] will provide you with your home health RN services. Contact # [phone]        [facility] Follow up.   Why: your IV antibiotic therapy will be provided by [facility] Contact information: [address], [place], [place] [address]  Phone: [phone]        [name], MD Follow up.   Specialty: Orthopedic Surgery Contact information: [address] [place] [address] [phone]                 Discharge Plan:  discharge to home  Disposition:     Signed: Benjiman Core  04/20/2021, 10:54 AM

## 2021-04-24 ENCOUNTER — Ambulatory Visit (INDEPENDENT_AMBULATORY_CARE_PROVIDER_SITE_OTHER): Payer: BC Managed Care – PPO | Admitting: Orthopaedic Surgery

## 2021-04-24 ENCOUNTER — Other Ambulatory Visit: Payer: Self-pay

## 2021-04-24 VITALS — Ht 62.0 in | Wt 218.0 lb

## 2021-04-24 DIAGNOSIS — Z981 Arthrodesis status: Secondary | ICD-10-CM | POA: Diagnosis not present

## 2021-04-24 DIAGNOSIS — T8141XA Infection following a procedure, superficial incisional surgical site, initial encounter: Secondary | ICD-10-CM

## 2021-04-24 NOTE — Progress Notes (Signed)
Post-Op Visit Note   [name]           Date of Birth: [birth year]           MRN: [id number] Visit Date: 04/24/2021 PCP: Isaac Bliss, [name], MD   Assessment & Plan: [facility] change done.  Small amount of necrotic tissue cleaned with sterile Q-tips and the back end of the sterile Q-tip and removed from the wound.  1 Vicryl suture noted at the base of the wound left and.  Beefy red tissue present on the sides.  Chief Complaint:  Chief Complaint  Patient presents with   Lower Back - Follow-up   Visit Diagnoses:  1. S/P lumbar fusion   2. Infection of superficial incisional surgical site after procedure, initial encounter     Plan: Plan continue VAC changes.  She is followed by ID Dr.Singh from infectious disease.  Return 5 weeks.  Continue outpatient VAC changes.  Follow-Up Instructions: Return in about 5 weeks (around 05/29/2021).   Orders:  No orders of the defined types were placed in this encounter.  No orders of the defined types were placed in this encounter.   Imaging: No results found.  PMFS History: Patient Active Problem List   Diagnosis Date Noted   Acute on chronic renal insufficiency 03/27/2021   Postoperative complication of skin involving drainage from surgical wound 03/25/2021   Post op infection 03/24/2021   S/P lumbar fusion 03/24/2021   Lumbar stenosis 03/09/2021   Degenerative spondylolisthesis 02/19/2021   Primary osteoarthritis of left knee 09/08/2020   DJD (degenerative joint disease) of knee 09/08/2020   Status post total left knee replacement 09/08/2020   Spondylosis without myelopathy or radiculopathy, lumbar region 07/17/2020   Chronic pain syndrome 07/17/2020   Myalgia 07/17/2020   OSA (obstructive sleep apnea) 05/19/2020   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/17/2020   Sleep disturbance 04/17/2020   Chronic pain of both shoulders 03/03/2020   Pain in both hands 03/03/2020   Primary osteoarthritis of  both knees 03/03/2020   Pain in both feet 03/03/2020   History of chronic kidney disease 03/03/2020   DDD (degenerative disc disease), lumbar 02/05/2020   Vitamin D deficiency 01/09/2020   Hyperlipidemia 01/09/2020   CKD (chronic kidney disease) stage 3, GFR 30-59 ml/min ([place]) 01/09/2020   HTN (hypertension)    Morbid obesity ([place])    Gout    Fibromyalgia    Depression    Insomnia    Past Medical History:  Diagnosis Date   Chronic kidney disease    Depression    Fibromyalgia    Gout    HTN (hypertension)    Insomnia    Morbid obesity ([place])    Osteoarthritis    Palpitation    Sleep apnea     Family History  Problem Relation Age of Onset   Hypothyroidism Mother    Hyperlipidemia Other    Kidney failure Father    Dementia Father    Gout Father    Arthritis Father    Prostate cancer Father     Past Surgical History:  Procedure Laterality Date   APPLICATION OF WOUND VAC N/A 03/25/2021   Procedure: APPLICATION OF WOUND VAC;  Surgeon: [name], MD;  Location: [facility] [place];  Service: Orthopedics;  Laterality: N/A;   APPLICATION OF WOUND VAC N/A 03/30/2021   Procedure: WOUND VAC CHANGE 12x6x5;  Surgeon: [name], MD;  Location: [facility];  Service: Orthopedics;  Laterality: N/A;   INCISION  AND DRAINAGE OF WOUND N/A 03/25/2021   Procedure: LUMBAR POST OP INCISION IRRIGATION;  Surgeon: [name], MD;  Location: [facility];  Service: Orthopedics;  Laterality: N/A;   JOINT REPLACEMENT     KNEE ARTHROSCOPY     LUMBAR WOUND DEBRIDEMENT N/A 03/30/2021   Procedure: REPEAT LUMBAR WOUND DEBRIDEMENT;  Surgeon: [name], MD;  Location: [facility];  Service: Orthopedics;  Laterality: N/A;   right rotator cuff     ROTATOR CUFF REPAIR Left    TOTAL KNEE ARTHROPLASTY Left 09/08/2020   Procedure: LEFT TOTAL KNEE ARTHROPLASTY;  Surgeon: Leandrew Koyanagi, MD;  Location: [facility];  Service: Orthopedics;  Laterality: Left;   TUBAL LIGATION     Social History   Occupational History   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

## 2021-04-29 ENCOUNTER — Encounter: Payer: Self-pay | Admitting: Internal Medicine

## 2021-05-05 ENCOUNTER — Telehealth: Payer: Self-pay

## 2021-05-05 NOTE — Telephone Encounter (Signed)
Gwen called requesting patients wound measurements along with the name of the clinic or home health that is following patients wound care.   Gwens # is [phone]

## 2021-05-07 ENCOUNTER — Encounter: Payer: Self-pay | Admitting: Family Medicine

## 2021-05-07 DIAGNOSIS — G4733 Obstructive sleep apnea (adult) (pediatric): Secondary | ICD-10-CM

## 2021-05-07 DIAGNOSIS — R0683 Snoring: Secondary | ICD-10-CM

## 2021-05-07 DIAGNOSIS — G4719 Other hypersomnia: Secondary | ICD-10-CM

## 2021-05-07 DIAGNOSIS — Z9989 Dependence on other enabling machines and devices: Secondary | ICD-10-CM

## 2021-05-11 ENCOUNTER — Ambulatory Visit: Payer: BC Managed Care – PPO | Admitting: Physician Assistant

## 2021-05-11 ENCOUNTER — Encounter: Payer: Self-pay | Admitting: Orthopaedic Surgery

## 2021-05-13 ENCOUNTER — Encounter: Payer: Self-pay | Admitting: Orthopaedic Surgery

## 2021-05-13 ENCOUNTER — Ambulatory Visit (INDEPENDENT_AMBULATORY_CARE_PROVIDER_SITE_OTHER): Payer: Self-pay | Admitting: Orthopaedic Surgery

## 2021-05-13 ENCOUNTER — Other Ambulatory Visit: Payer: Self-pay

## 2021-05-13 VITALS — BP 134/77 | HR 96 | Ht 62.0 in | Wt 208.0 lb

## 2021-05-13 DIAGNOSIS — L7682 Other postprocedural complications of skin and subcutaneous tissue: Secondary | ICD-10-CM

## 2021-05-13 MED ORDER — HYDROCODONE-ACETAMINOPHEN 7.5-325 MG PO TABS
1.0000 | ORAL_TABLET | Freq: Four times a day (QID) | ORAL | 0 refills | Status: DC | PRN
Start: 2021-05-13 — End: 2021-05-27

## 2021-05-13 NOTE — Progress Notes (Signed)
Post-Op Visit Note   [name]           Date of Birth: June 23, 1950           MRN: [id number] Visit Date: 05/13/2021 PCP: Isaac Bliss, [name], MD   Assessment & Plan: Postop lumbar fusion.  Patient with weight loss, low albumin and wound dehiscence.  VAC change done today.  Central area is 3 to 4 cm deep cleaned with Q-tip and small sponge placed in the central deep area and remaining area 9 x 6 cm area has granulation tissue and is filling in nicely.  Continue Levaquin.  Recheck as scheduled in March.  Chief Complaint:  Chief Complaint  Patient presents with   Lower Back - Wound Check    03/30/2021 repeat lumbar wound debridement, wound vac application   Visit Diagnoses:  1. Postoperative complication of skin involving drainage from surgical wound     Plan: VAC change done.  New prescription for Norco 5/325 20 tablets sent in the severe last prescription we discussed stopping the medication after this and she has been on it for several months.  Follow-Up Instructions: No follow-ups on file.   Orders:  No orders of the defined types were placed in this encounter.  No orders of the defined types were placed in this encounter.   Imaging: No results found.  PMFS History: Patient Active Problem List   Diagnosis Date Noted   Acute on chronic renal insufficiency 03/27/2021   Postoperative complication of skin involving drainage from surgical wound 03/25/2021   Post op infection 03/24/2021   S/P lumbar fusion 03/24/2021   Lumbar stenosis 03/09/2021   Degenerative spondylolisthesis 02/19/2021   Primary osteoarthritis of left knee 09/08/2020   DJD (degenerative joint disease) of knee 09/08/2020   Status post total left knee replacement 09/08/2020   Spondylosis without myelopathy or radiculopathy, lumbar region 07/17/2020   Chronic pain syndrome 07/17/2020   Myalgia 07/17/2020   OSA (obstructive sleep apnea) 05/19/2020   Bilateral post-traumatic  osteoarthritis of knee 04/17/2020   Sleep disturbance 04/17/2020   Chronic pain of both shoulders 03/03/2020   Pain in both hands 03/03/2020   Primary osteoarthritis of both knees 03/03/2020   Pain in both feet 03/03/2020   History of chronic kidney disease 03/03/2020   DDD (degenerative disc disease), lumbar 02/05/2020   Vitamin D deficiency 01/09/2020   Hyperlipidemia 01/09/2020   CKD (chronic kidney disease) stage 3, GFR 30-59 ml/min ([place]) 01/09/2020   HTN (hypertension)    Morbid obesity ([place])    Gout    Fibromyalgia    Depression    Insomnia    Past Medical History:  Diagnosis Date   Chronic kidney disease    Depression    Fibromyalgia    Gout    HTN (hypertension)    Insomnia    Morbid obesity ([place])    Osteoarthritis    Palpitation    Sleep apnea     Family History  Problem Relation Age of Onset   Hypothyroidism Mother    Hyperlipidemia Other    Kidney failure Father    Dementia Father    Gout Father    Arthritis Father    Prostate cancer Father     Past Surgical History:  Procedure Laterality Date   APPLICATION OF WOUND VAC N/A 03/25/2021   Procedure: APPLICATION OF WOUND VAC;  Surgeon: [name], MD;  Location: [facility];  Service: Orthopedics;  Laterality: N/A;   APPLICATION OF WOUND  VAC N/A 03/30/2021   Procedure: WOUND VAC CHANGE 12x6x5;  Surgeon: [name], MD;  Location: [facility];  Service: Orthopedics;  Laterality: N/A;   INCISION AND DRAINAGE OF WOUND N/A 03/25/2021   Procedure: LUMBAR POST OP INCISION IRRIGATION;  Surgeon: [name], MD;  Location: [facility];  Service: Orthopedics;  Laterality: N/A;   JOINT REPLACEMENT     KNEE ARTHROSCOPY     LUMBAR WOUND DEBRIDEMENT N/A 03/30/2021   Procedure: REPEAT LUMBAR WOUND DEBRIDEMENT;  Surgeon: [name], MD;  Location: [facility];  Service: Orthopedics;  Laterality: N/A;   right rotator cuff     ROTATOR CUFF REPAIR Left    TOTAL KNEE ARTHROPLASTY Left 09/08/2020   Procedure: LEFT TOTAL KNEE ARTHROPLASTY;   Surgeon: Leandrew Koyanagi, MD;  Location: [facility];  Service: Orthopedics;  Laterality: Left;   TUBAL LIGATION     Social History   Occupational History   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

## 2021-05-13 NOTE — Addendum Note (Signed)
Addended by: Darleen Crocker on: 05/13/2021 11:26 AM   Modules accepted: Orders

## 2021-05-19 ENCOUNTER — Encounter: Payer: Self-pay | Admitting: Internal Medicine

## 2021-05-19 ENCOUNTER — Other Ambulatory Visit: Payer: Self-pay

## 2021-05-19 ENCOUNTER — Ambulatory Visit: Payer: 59 | Admitting: Internal Medicine

## 2021-05-19 ENCOUNTER — Other Ambulatory Visit: Payer: Self-pay | Admitting: Internal Medicine

## 2021-05-19 VITALS — BP 121/75 | HR 80 | Resp 16 | Ht 62.0 in | Wt 213.0 lb

## 2021-05-19 DIAGNOSIS — M4626 Osteomyelitis of vertebra, lumbar region: Secondary | ICD-10-CM

## 2021-05-19 MED ORDER — LEVOFLOXACIN 250 MG PO TABS: 250.0000 mg | ORAL_TABLET | Freq: Every day | ORAL | 0 refills | Status: DC

## 2021-05-19 NOTE — Progress Notes (Signed)
Patient Active Problem List   Diagnosis Date Noted   Acute on chronic renal insufficiency 03/27/2021   Postoperative complication of skin involving drainage from surgical wound 03/25/2021   Post op infection 03/24/2021   S/P lumbar fusion 03/24/2021   Lumbar stenosis 03/09/2021   Degenerative spondylolisthesis 02/19/2021   Primary osteoarthritis of left knee 09/08/2020   DJD (degenerative joint disease) of knee 09/08/2020   Status post total left knee replacement 09/08/2020   Spondylosis without myelopathy or radiculopathy, lumbar region 07/17/2020   Chronic pain syndrome 07/17/2020   Myalgia 07/17/2020   OSA (obstructive sleep apnea) 05/19/2020   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/17/2020   Sleep disturbance 04/17/2020   Chronic pain of both shoulders 03/03/2020   Pain in both hands 03/03/2020   Primary osteoarthritis of both knees 03/03/2020   Pain in both feet 03/03/2020   History of chronic kidney disease 03/03/2020   DDD (degenerative disc disease), lumbar 02/05/2020   Vitamin D deficiency 01/09/2020   Hyperlipidemia 01/09/2020   CKD (chronic kidney disease) stage 3, GFR 30-59 ml/min ([place]) 01/09/2020   HTN (hypertension)    Morbid obesity (HCC)    Gout    Fibromyalgia    Depression    Insomnia     Patient's Medications  New Prescriptions   No medications on file  Previous Medications   ALLOPURINOL (ZYLOPRIM) 100 MG TABLET    TAKE 2 TABLETS BY MOUTH EVERY DAY   AMLODIPINE (NORVASC) 5 MG TABLET    Take 1 tablet (5 mg total) by mouth daily.   ATORVASTATIN (LIPITOR) 10 MG TABLET    TAKE 1 TABLET BY MOUTH EVERYDAY AT BEDTIME   DIAZEPAM (VALIUM) 5 MG TABLET    Take 1 by mouth 1 hour  pre-procedure with very light food. May bring 2nd tablet to appointment.   DULOXETINE (CYMBALTA) 30 MG CAPSULE    TAKE 1 CAPSULE BY MOUTH EVERY DAY   ESZOPICLONE (LUNESTA) 2 MG TABS TABLET    TAKE 1 TABLET BY MOUTH AT NIGHT IMMEDIATELY BEFORE BEDTIME    FLUTICASONE (FLONASE) 50 MCG/ACT NASAL SPRAY    Place 1 spray into both nostrils daily as needed for allergies.   HYDROCODONE-ACETAMINOPHEN (NORCO) 7.5-325 MG TABLET    Take 1 tablet by mouth every 4 (four) hours as needed for moderate pain.   HYDROCODONE-ACETAMINOPHEN (NORCO) 7.5-325 MG TABLET    Take 1-2 tablets by mouth every 12 (twelve) hours as needed for moderate pain.   HYDROCODONE-ACETAMINOPHEN (NORCO) 7.5-325 MG TABLET    Take 1 tablet by mouth every 6 (six) hours as needed for moderate pain.   HYDROXYCHLOROQUINE (PLAQUENIL) 200 MG TABLET    Take 1 tablet (200 mg total) by mouth daily.   LEVOFLOXACIN (LEVAQUIN) 750 MG TABLET    Take 1 tablet (750 mg total) by mouth every other day for 21 doses.   LORATADINE (CLARITIN) 10 MG TABLET    Take 10 mg by mouth daily as needed for allergies.   LOSARTAN (COZAAR) 50 MG TABLET    Take 1 tablet (50 mg total) by mouth daily.   METOCLOPRAMIDE (REGLAN) 5 MG TABLET    Take 1-2 tablets (5-10 mg total) by mouth every 8 (eight) hours as needed for nausea (if ondansetron (ZOFRAN) ineffective.).   METRONIDAZOLE (FLAGYL) 500 MG TABLET    Take 1 tablet (500 mg total) by mouth 2 (two) times daily for 84 doses.   ONDANSETRON (ZOFRAN) 4 MG TABLET  Take 1 tablet (4 mg total) by mouth every 8 (eight) hours as needed for nausea or vomiting.   ONDANSETRON (ZOFRAN) 4 MG TABLET    Take 1 tablet (4 mg total) by mouth every 8 (eight) hours as needed for nausea or vomiting.   PREDNISONE (DELTASONE) 10 MG TABLET    Take 10 mg by mouth as needed.   PREGABALIN (LYRICA) 75 MG CAPSULE    Take 1 capsule (75 mg total) by mouth 3 (three) times daily.   TIZANIDINE (ZANAFLEX) 4 MG TABLET    Take 1 tablet (4 mg total) by mouth every 8 (eight) hours as needed for muscle spasms.  Modified Medications   No medications on file  Discontinued Medications   No medications on file    Subjective: 71 year old female with obesity, CKD, L4-L5 stenosis anterolisthesis status post L4-5  transforaminal lumbar interbody fusion with pedicle instrumentation cage lateral fusion right L5-S1 microdiscectomy on 03/09/2021.  Patient developed drainage from surgical site infection hospitalized underwent I&D(03/25/21) of lumbar incision.  OR cultures positive for Enterobacter aeruginosa, Morganella and Bacteroides fragilis.  She underwent repeat debridement in the OR due to purulent drainage on 03/30/21. Patient was initially on cefazolin transitioned to cefpime then metronidazole added.  Plan was to complete 2 weeks of IV therapy with cefepime and metronidazole (04/14/21) then transition to levaquin and metro to complete 8 weeks of antibiotics from OR on 03/30/21. She does have retained hardware as suuch will need suppressive antibiotics with metro and levaquin.  Interval 1/24: Changed to levaquin and metronidazole 05/19/21:Wound vac in place. Followed by Dr. Ames Coupe). Pt reports tolerating levaquin and metronidazole.   Review of Systems: Review of Systems  All other systems reviewed and are negative.  Past Medical History:  Diagnosis Date   Chronic kidney disease    Depression    Fibromyalgia    Gout    HTN (hypertension)    Insomnia    Morbid obesity (HCC)    Osteoarthritis    Palpitation    Sleep apnea     Social History   Tobacco Use   Smoking status: Never   Smokeless tobacco: Never  Vaping Use   Vaping Use: Never used  Substance Use Topics   Alcohol use: Never    Alcohol/week: 0.0 standard drinks   Drug use: Never    Family History  Problem Relation Age of Onset   Hypothyroidism Mother    Hyperlipidemia Other    Kidney failure Father    Dementia Father    Gout Father    Arthritis Father    Prostate cancer Father     Allergies  Allergen Reactions   Cefuroxime Axetil Rash and Hives   Clarithromycin Rash and Hives    Other reaction(s): Unknown   Nsaids Nausea And Vomiting    Other reaction(s): Unknown   Cefuroxime     Other reaction(s): Unknown    Oxycodone Other (See Comments)    hallucinations   Oxycodone-Acetaminophen Other (See Comments)    Hallucination Other reaction(s): Unknown   Robaxin [Methocarbamol] Nausea And Vomiting    "It tears my stomach up."   Toradol [Ketorolac Tromethamine] Nausea And Vomiting   Tramadol Nausea And Vomiting and Other (See Comments)    Upset stomach Other reaction(s): Unknown    Health Maintenance  Topic Date Due   Hepatitis C Screening  Never done   COVID-19 Vaccine (4 - Booster for Moderna series) 03/19/2020   Zoster Vaccines- Shingrix (1 of 2) 06/03/2021 (Originally 02/25/1970)   MAMMOGRAM  01/01/2023   COLONOSCOPY (Pts 45-57yr Insurance coverage will need to be confirmed)  01/04/2024   TETANUS/TDAP  01/07/2030   Pneumonia Vaccine 71 Years old  Completed   INFLUENZA VACCINE  Completed   DEXA SCAN  Completed   HPV VACCINES  Aged Out    Objective:  Vitals:   05/19/21 1054  BP: 121/75  Pulse: 80  Resp: 16  SpO2: 98%  Weight: 213 lb (96.6 kg)  Height: 5' 2"  (1.575 m)   Body mass index is 38.96 kg/m.  Physical Exam Constitutional:      Appearance: Normal appearance.  HENT:     Head: Normocephalic and atraumatic.     Right Ear: Tympanic membrane normal.     Left Ear: Tympanic membrane normal.     Nose: Nose normal.     Mouth/Throat:     Mouth: Mucous membranes are moist.  Eyes:     Extraocular Movements: Extraocular movements intact.     Conjunctiva/sclera: Conjunctivae normal.     Pupils: Pupils are equal, round, and reactive to light.  Cardiovascular:     Rate and Rhythm: Normal rate and regular rhythm.     Heart sounds: No murmur heard.   No friction rub. No gallop.  Pulmonary:     Effort: Pulmonary effort is normal.     Breath sounds: Normal breath sounds.  Abdominal:     General: Abdomen is flat.     Palpations: Abdomen is soft.  Musculoskeletal:        General: Normal range of motion.  Skin:    General: Skin is warm and dry.     Comments: Wound vac on  lower back  Neurological:     General: No focal deficit present.     Mental Status: She is alert and oriented to person, place, and time.  Psychiatric:        Mood and Affect: Mood normal.    Lab Results Lab Results  Component Value Date   WBC 4.4 04/14/2021   HGB 9.0 (L) 04/14/2021   HCT 28.5 (L) 04/14/2021   MCV 87.2 04/14/2021   PLT 356 04/14/2021    Lab Results  Component Value Date   CREATININE 1.52 (H) 04/14/2021   BUN 28 (H) 04/14/2021   NA 139 04/14/2021   K 4.5 04/14/2021   CL 109 04/14/2021   CO2 26 04/14/2021    Lab Results  Component Value Date   ALT 8 04/14/2021   AST 14 04/14/2021   ALKPHOS 130 (H) 03/30/2021   BILITOT 0.3 04/14/2021    Lab Results  Component Value Date   CHOL 169 09/03/2020   HDL 70.70 09/03/2020   LDLCALC 90 09/03/2020   TRIG 42.0 09/03/2020   CHOLHDL 2 09/03/2020   No results found for: LABRPR, RPRTITER No results found for: HIV1RNAQUANT, HIV1RNAVL, CD4TABS   #Lumbar wound infection with retained hardware SP I&D on 03/25/21  #OR Cx from 03/25/21 +  enterobacter aeruginosa,Morganella morganii,  bacteroides fragilis(beta lactamase positive) #CKD Stage III -Completed 2 weeks of cefepime from OR on 1/24 and started on levaquin 7540mPO q48h(renally dosed) and continue metronidazole 50065mO bid(EOT 05/25/21). PT has wound vac in place and continues to follow with Dr. YatLorin MercyPlan: -Transition to suppressive metronidazole and levaquin(250m2m daily ->renally dosed) following 8 weeks of antibiotics on 05/25/21.  -Labs today: cbc/cmp/esr/crp -Follow-up in 1 month   MayaLaurice Record [facility] 05/19/2021, 10:59 AM

## 2021-05-20 ENCOUNTER — Other Ambulatory Visit: Payer: Self-pay | Admitting: Internal Medicine

## 2021-05-20 LAB — COMPLETE METABOLIC PANEL WITH GFR
AG Ratio: 1.3 (calc) (ref 1.0–2.5)
ALT: 10 U/L (ref 6–29)
AST: 18 U/L (ref 10–35)
Albumin: 3.7 g/dL (ref 3.6–5.1)
Alkaline phosphatase (APISO): 73 U/L (ref 37–153)
BUN: 13 mg/dL (ref 7–25)
CO2: 29 mmol/L (ref 20–32)
Calcium: 9.6 mg/dL (ref 8.6–10.4)
Chloride: 106 mmol/L (ref 98–110)
Creat: 1.04 mg/dL (ref 0.50–1.05)
Globulin: 2.9 g/dL (calc) (ref 1.9–3.7)
Glucose, Bld: 87 mg/dL (ref 65–99)
Potassium: 4.4 mmol/L (ref 3.5–5.3)
Sodium: 141 mmol/L (ref 135–146)
Total Bilirubin: 0.3 mg/dL (ref 0.2–1.2)
Total Protein: 6.6 g/dL (ref 6.1–8.1)
eGFR: 59 mL/min/{1.73_m2} — ABNORMAL LOW (ref >=60)

## 2021-05-20 LAB — SEDIMENTATION RATE: Sed Rate: 46 mm/h — ABNORMAL HIGH (ref 0–30)

## 2021-05-20 LAB — CBC WITH DIFFERENTIAL/PLATELET
Absolute Monocytes: 588 cells/uL (ref 200–950)
Basophils Absolute: 21 cells/uL (ref 0–200)
Basophils Relative: 0.4 %
Eosinophils Absolute: 239 cells/uL (ref 15–500)
Eosinophils Relative: 4.5 %
HCT: 33.5 % — ABNORMAL LOW (ref 35.0–45.0)
Hemoglobin: 10.7 g/dL — ABNORMAL LOW (ref 11.7–15.5)
Lymphs Abs: 1723 cells/uL (ref 850–3900)
MCH: 27.6 pg (ref 27.0–33.0)
MCHC: 31.9 g/dL — ABNORMAL LOW (ref 32.0–36.0)
MCV: 86.3 fL (ref 80.0–100.0)
MPV: 9.9 fL (ref 7.5–12.5)
Monocytes Relative: 11.1 %
Neutro Abs: 2730 cells/uL (ref 1500–7800)
Neutrophils Relative %: 51.5 %
Platelets: 363 10*3/uL (ref 140–400)
RBC: 3.88 10*6/uL (ref 3.80–5.10)
RDW: 14.7 % (ref 11.0–15.0)
Total Lymphocyte: 32.5 %
WBC: 5.3 10*3/uL (ref 3.8–10.8)

## 2021-05-20 LAB — C-REACTIVE PROTEIN: CRP: 8.3 mg/L — ABNORMAL HIGH (ref <8.0)

## 2021-05-20 MED ORDER — LEVOFLOXACIN 250 MG PO TABS: 250.0000 mg | ORAL_TABLET | Freq: Every day | ORAL | 5 refills | Status: DC

## 2021-05-20 NOTE — Progress Notes (Signed)
Rx levaquin 30 tabs x 5 refills ?

## 2021-05-22 ENCOUNTER — Other Ambulatory Visit: Payer: Self-pay

## 2021-05-22 ENCOUNTER — Ambulatory Visit (INDEPENDENT_AMBULATORY_CARE_PROVIDER_SITE_OTHER): Payer: 59 | Admitting: Orthopaedic Surgery

## 2021-05-22 ENCOUNTER — Encounter: Payer: Self-pay | Admitting: Orthopaedic Surgery

## 2021-05-22 ENCOUNTER — Telehealth: Payer: Self-pay

## 2021-05-22 VITALS — BP 147/75 | HR 86 | Ht 62.0 in | Wt 213.0 lb

## 2021-05-22 DIAGNOSIS — L7682 Other postprocedural complications of skin and subcutaneous tissue: Secondary | ICD-10-CM

## 2021-05-22 NOTE — Telephone Encounter (Signed)
[facility] called into the office and needs a new referral to be sent in to [facility] in [place] for the patient to be seen she changed her insurance so she had to be discharged. Patient has a wound vac and needs to be seen at least by Monday  ? ?[phone] -- jennifer anderson  ?Fax # [phone] ?

## 2021-05-22 NOTE — Progress Notes (Deleted)
? ?Post-Op Visit Note ?  ?[name]           ?Date of Birth: [birth year]           ?MRN: [id number] ?Visit Date: 05/22/2021 ?PCP: Isaac Bliss, [name], MD ? ? ?Assessment & Plan: ? ?Chief Complaint: No chief complaint on file. ? ?Visit Diagnoses: No diagnosis found. ? ?Plan: *** ? ?Follow-Up Instructions: No follow-ups on file.  ? ?Orders:  ?No orders of the defined types were placed in this encounter. ? ?No orders of the defined types were placed in this encounter. ? ? ?Imaging: ?No results found. ? ?PMFS History: ?Patient Active Problem List  ? Diagnosis Date Noted  ? Acute on chronic renal insufficiency 03/27/2021  ? Postoperative complication of skin involving drainage from surgical wound 03/25/2021  ? Post op infection 03/24/2021  ? S/P lumbar fusion 03/24/2021  ? Lumbar stenosis 03/09/2021  ? Degenerative spondylolisthesis 02/19/2021  ? Primary osteoarthritis of left knee 09/08/2020  ? DJD (degenerative joint disease) of knee 09/08/2020  ? Status post total left knee replacement 09/08/2020  ? Spondylosis without myelopathy or radiculopathy, lumbar region 07/17/2020  ? Chronic pain syndrome 07/17/2020  ? Myalgia 07/17/2020  ? OSA (obstructive sleep apnea) 05/19/2020  ? 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/17/2020  ? Sleep disturbance 04/17/2020  ? Chronic pain of both shoulders 03/03/2020  ? Pain in both hands 03/03/2020  ? Primary osteoarthritis of both knees 03/03/2020  ? Pain in both feet 03/03/2020  ? History of chronic kidney disease 03/03/2020  ? DDD (degenerative disc disease), lumbar 02/05/2020  ? Vitamin D deficiency 01/09/2020  ? Hyperlipidemia 01/09/2020  ? CKD (chronic kidney disease) stage 3, GFR 30-59 ml/min (HCC) 01/09/2020  ? HTN (hypertension)   ? Morbid obesity ([place])   ? Gout   ? Fibromyalgia   ? Depression   ? Insomnia   ? ?Past Medical History:  ?Diagnosis Date  ? Chronic kidney disease   ? Depression   ? Fibromyalgia   ? Gout   ? HTN (hypertension)    ? Insomnia   ? Morbid obesity ([place])   ? Osteoarthritis   ? Palpitation   ? Sleep apnea   ?  ?Family History  ?Problem Relation Age of Onset  ? Hypothyroidism Mother   ? Hyperlipidemia Other   ? Kidney failure Father   ? Dementia Father   ? Gout Father   ? Arthritis Father   ? Prostate cancer Father   ?  ?Past Surgical History:  ?Procedure Laterality Date  ? APPLICATION OF WOUND VAC N/A 03/25/2021  ? Procedure: APPLICATION OF WOUND VAC;  Surgeon: [name], MD;  Location: [facility];  Service: Orthopedics;  Laterality: N/A;  ? APPLICATION OF WOUND VAC N/A 03/30/2021  ? Procedure: WOUND VAC CHANGE 12x6x5;  Surgeon: [name], MD;  Location: [facility];  Service: Orthopedics;  Laterality: N/A;  ? INCISION AND DRAINAGE OF WOUND N/A 03/25/2021  ? Procedure: LUMBAR POST OP INCISION IRRIGATION;  Surgeon: [name], MD;  Location: [facility];  Service: Orthopedics;  Laterality: N/A;  ? JOINT REPLACEMENT    ? KNEE ARTHROSCOPY    ? LUMBAR WOUND DEBRIDEMENT N/A 03/30/2021  ? Procedure: REPEAT LUMBAR WOUND DEBRIDEMENT;  Surgeon: [name], MD;  Location: [facility];  Service: Orthopedics;  Laterality: N/A;  ? right rotator cuff    ? ROTATOR CUFF REPAIR Left   ? TOTAL KNEE ARTHROPLASTY Left 09/08/2020  ? Procedure: LEFT TOTAL KNEE  ARTHROPLASTY;  Surgeon: Leandrew Koyanagi, MD;  Location: [facility];  Service: Orthopedics;  Laterality: Left;  ? TUBAL LIGATION    ? ?Social History  ? ?Occupational History  ? 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 Smoking status: Never  ? Smokeless tobacco: Never  ?Vaping Use  ? Vaping Use: Never used  ?Substance and Sexual Activity  ? Alcohol use: Never  ?  Alcohol/week: 0.0 standard drinks  ? Drug use: Never  ? Sexual activity: Never  ? ? ? ?

## 2021-05-22 NOTE — Telephone Encounter (Signed)
faxed

## 2021-05-22 NOTE — Telephone Encounter (Signed)
[facility] for orders? Wound vac change 3x/wk? ?

## 2021-05-22 NOTE — Progress Notes (Signed)
? ?Post-Op Visit Note ?  ?[name]           ?Date of Birth: February 19, 1951           ?MRN: [id number] ?Visit Date: 05/22/2021 ?PCP: Isaac Bliss, [name], MD ? ? ?Assessment & Plan: Patient turns back today to perform lumbar region.  Tiny central hole allows a 3 cm piece of sponge placed down the center hole about the width of a ballpoint pen.  Wrist there is granulating rapidly.  VAC reapplied.  Return recheck 3 weeks. ? ?Chief Complaint:  ?Chief Complaint  ?Patient presents with  ? Lower Back - Wound Check  ?  03/30/2021 repeat lumbar wound debridement, wound vac application  ? ?Visit Diagnoses:  ?1. Postoperative complication of skin involving drainage from surgical wound   ? ? ?Plan: Return 3 weeks. ? ?Follow-Up Instructions: Return in about 3 weeks (around 06/12/2021).  ? ?Orders:  ?No orders of the defined types were placed in this encounter. ? ?No orders of the defined types were placed in this encounter. ? ? ?Imaging: ?No results found. ? ?PMFS History: ?Patient Active Problem List  ? Diagnosis Date Noted  ? Acute on chronic renal insufficiency 03/27/2021  ? Postoperative complication of skin involving drainage from surgical wound 03/25/2021  ? Post op infection 03/24/2021  ? S/P lumbar fusion 03/24/2021  ? Lumbar stenosis 03/09/2021  ? Degenerative spondylolisthesis 02/19/2021  ? Primary osteoarthritis of left knee 09/08/2020  ? DJD (degenerative joint disease) of knee 09/08/2020  ? Status post total left knee replacement 09/08/2020  ? Spondylosis without myelopathy or radiculopathy, lumbar region 07/17/2020  ? Chronic pain syndrome 07/17/2020  ? Myalgia 07/17/2020  ? OSA (obstructive sleep apnea) 05/19/2020  ? 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/17/2020  ? Sleep disturbance 04/17/2020  ? Chronic pain of both shoulders 03/03/2020  ? Pain in both hands 03/03/2020  ? Primary osteoarthritis of both knees 03/03/2020  ? Pain in both feet 03/03/2020  ? History of chronic kidney  disease 03/03/2020  ? DDD (degenerative disc disease), lumbar 02/05/2020  ? Vitamin D deficiency 01/09/2020  ? Hyperlipidemia 01/09/2020  ? CKD (chronic kidney disease) stage 3, GFR 30-59 ml/min (HCC) 01/09/2020  ? HTN (hypertension)   ? Morbid obesity ([place])   ? Gout   ? Fibromyalgia   ? Depression   ? Insomnia   ? ?Past Medical History:  ?Diagnosis Date  ? Chronic kidney disease   ? Depression   ? Fibromyalgia   ? Gout   ? HTN (hypertension)   ? Insomnia   ? Morbid obesity ([place])   ? Osteoarthritis   ? Palpitation   ? Sleep apnea   ?  ?Family History  ?Problem Relation Age of Onset  ? Hypothyroidism Mother   ? Hyperlipidemia Other   ? Kidney failure Father   ? Dementia Father   ? Gout Father   ? Arthritis Father   ? Prostate cancer Father   ?  ?Past Surgical History:  ?Procedure Laterality Date  ? APPLICATION OF WOUND VAC N/A 03/25/2021  ? Procedure: APPLICATION OF WOUND VAC;  Surgeon: [name], MD;  Location: [facility];  Service: Orthopedics;  Laterality: N/A;  ? APPLICATION OF WOUND VAC N/A 03/30/2021  ? Procedure: WOUND VAC CHANGE 12x6x5;  Surgeon: [name], MD;  Location: [facility];  Service: Orthopedics;  Laterality: N/A;  ? INCISION AND DRAINAGE OF WOUND N/A 03/25/2021  ? Procedure: LUMBAR POST OP INCISION IRRIGATION;  Surgeon: Lorin Mercy,  Thana Farr, MD;  Location: [facility];  Service: Orthopedics;  Laterality: N/A;  ? JOINT REPLACEMENT    ? KNEE ARTHROSCOPY    ? LUMBAR WOUND DEBRIDEMENT N/A 03/30/2021  ? Procedure: REPEAT LUMBAR WOUND DEBRIDEMENT;  Surgeon: [name], MD;  Location: [facility];  Service: Orthopedics;  Laterality: N/A;  ? right rotator cuff    ? ROTATOR CUFF REPAIR Left   ? TOTAL KNEE ARTHROPLASTY Left 09/08/2020  ? Procedure: LEFT TOTAL KNEE ARTHROPLASTY;  Surgeon: Leandrew Koyanagi, MD;  Location: [facility];  Service: Orthopedics;  Laterality: Left;  ? TUBAL LIGATION    ? ?Social History  ? ?Occupational History  ? 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 Smoking status: Never  ? Smokeless tobacco: Never  ?Vaping Use  ?  Vaping Use: Never used  ?Substance and Sexual Activity  ? Alcohol use: Never  ?  Alcohol/week: 0.0 standard drinks  ? Drug use: Never  ? Sexual activity: Never  ? ? ? ?

## 2021-05-25 ENCOUNTER — Encounter: Payer: Self-pay | Admitting: Radiology

## 2021-05-25 ENCOUNTER — Ambulatory Visit (INDEPENDENT_AMBULATORY_CARE_PROVIDER_SITE_OTHER): Payer: 59 | Admitting: Radiology

## 2021-05-25 ENCOUNTER — Other Ambulatory Visit: Payer: Self-pay

## 2021-05-25 VITALS — Ht 62.0 in | Wt 213.0 lb

## 2021-05-25 DIAGNOSIS — L7682 Other postprocedural complications of skin and subcutaneous tissue: Secondary | ICD-10-CM

## 2021-05-25 NOTE — Telephone Encounter (Signed)
Patient sent My Chart message this morning stating that home health orders were not received and she needed to come in today for wound vac change. My email states that the fax went through on Friday afternoon. I have called and left voicemail for Duwayne Heck requesting return call to see if she indeed did find the rx, if she needs for me to refax it, and if this is the best number to use.  ? ?Appt made for patient to come in this afternoon for wound vac change. ?

## 2021-05-25 NOTE — Progress Notes (Signed)
Patient came in to office for wound vac change. Originally, her [facility] agency advised they did not receive new order for wound vac changes, however, prior to patient coming to office, they found fax and she will return to vac changes at home on Wednesday. Vac was changed with [facility]. No complications.  ?

## 2021-05-27 ENCOUNTER — Encounter
Payer: Commercial Managed Care - PPO | Attending: Physical Medicine and Rehabilitation | Admitting: Physical Medicine and Rehabilitation

## 2021-05-27 ENCOUNTER — Other Ambulatory Visit: Payer: Self-pay | Admitting: Physical Medicine and Rehabilitation

## 2021-05-27 ENCOUNTER — Encounter: Payer: Self-pay | Admitting: Physical Medicine and Rehabilitation

## 2021-05-27 ENCOUNTER — Other Ambulatory Visit: Payer: Self-pay

## 2021-05-27 ENCOUNTER — Other Ambulatory Visit: Payer: Self-pay | Admitting: Internal Medicine

## 2021-05-27 DIAGNOSIS — T148XXA Other injury of unspecified body region, initial encounter: Secondary | ICD-10-CM | POA: Diagnosis not present

## 2021-05-27 DIAGNOSIS — R52 Pain, unspecified: Secondary | ICD-10-CM

## 2021-05-27 MED ORDER — HYDROCODONE-ACETAMINOPHEN 7.5-325 MG PO TABS: 1.0000 | ORAL_TABLET | Freq: Two times a day (BID) | ORAL | 0 refills | Status: DC | PRN

## 2021-05-28 NOTE — Progress Notes (Signed)
Subjective:    Patient ID: Amy Hooper, female    DOB: April 20, 1950, 71 y.o.   MRN: [id number]  HPI An audio/video tele-health visit is felt to be the most appropriate encounter for this patient at this time. This is a follow up tele-visit via phone. The patient is at home. MD is at office. Prior to scheduling this appointment, our staff discussed the limitations of evaluation and management by telemedicine and the availability of in-person appointments. The patient expressed understanding and agreed to proceed.   Female with past medical history/past surgical history of OSA, morbid obesity, hypertension, fibromyalgia, depression, gout, CKD, degenerative disc disease-lumbar, bilateral knee surgeries for torn mensici, bilateral shoulder surgeries for rotator cuff presents with bilateral L > R knee pain and wound-related pain  1) Wound related pain -she is having severe pain associated with her wound vac dressing changes -she has been taking 2 hydrocodone per day and this allows her to function but she states her surgeon is no longer willing to prescribe for her -she asks about alternative pain medications she can take -she says her surgeon says her wound vac may be able to be removed at the end of March.   2) Knee pain: Initially stated: Started ~1990. After a fall on her knees.  Progressively getting worse.  Steroid injections improve the pain along with brace.  Ambulation exacerbates the pain.  Moving after prolonged postures exacerbates the pain.  Achy.  Radiates down leg.  Intermittent. Denies associated weakness, numbness.  Tylenol. 1 fall summer of 2021 falling backward in chair.  Pain limits ambulation.  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 She has had bilateral L4-5 lumbar epidural steroid injections.  She has also had facet injections.  She has had an MRI in 2018 showing mild bilateral recess stenosis at L4-5 and central disc protrusion at L5-S1 with  mass-effect on ventral thecal sac and?  Irritation of S1 nerve roots.  She had a left knee steroid injection on 03/18/2020 with Ortho.  She is seen rheumatology as well.  No reviewed from rheumatology, orthopedic surgeon, physiatrist, neurology-plan for knee replacement in December 2022. She does note that she is improving with weight loss and dietary changes.     She saw pain management since that time and is scheduled for MBB.  Since last visit, she states she has had great improvement with pool therapy. She states she is hungry. She had good benefits with Lyrica. She has not seen Nephro yet. She is awaiting CPAP.  She has bad fibromyalgia flares when the weather changes- especially when it rains. She was started on Neurontin by Dr. Posey Pronto and it does help on a daily basis. She had relief with Celebrex well before but had to stop due to CKD. She has never tried steroids.   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   Situational depression: She is going through changes with her family. They are all living in her house and everyone is arguing. She does not want to get herself worked up due to her hypertension. Her family brings her in a lot. Her husband says why don't you say anything. She felt a car coming across her yesterday and she screamed. She feels overwhelmed. Her mother may have undiagnosed mental illness. Her mom called th police on her husband.   Severe arthritis and bulging discs: -she has a lot of pain and nothing helps it. If she sits too long it hurts and if she walks  to long it hurts -Lyrica and steroids help her pain -she is planing to have surgery.   Weight gain  Not voiding as much as she used to -check Creatinine today.    Pain Inventory Average Pain 8 Pain Right Now 5 My pain is aching, stabbing  In the last 24 hours, has pain interfered with the following? General activity 8 Relation with others 8 Enjoyment of life 8 What TIME of day is your pain at its worst? morning  Sleep  (in general) Fair  Pain is worse with: walking, bending, sitting, inactivity, standing, and some activites Pain improves with: rest and medication Relief from Meds: 2   Family History  Problem Relation Age of Onset   Hypothyroidism Mother    Hyperlipidemia Other    Kidney failure Father    Dementia Father    Gout Father    Arthritis Father    Prostate cancer Father    Social History   Socioeconomic History   Marital status: Married    Spouse name: Not on file   Number of children: 3   Years of education: Not on file   Highest education level: Bachelor's degree (e.g., BA, AB, BS)  Occupational History   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     Social Determinants of Health   Financial Resource Strain: Low Risk    Difficulty of Paying Living Expenses: Not hard at all  Food Insecurity: No Food Insecurity   Worried About Charity fundraiser in the Last Year: Never true   [place] in the Last Year: Never true  Transportation Needs: No Transportation Needs   Lack of Transportation (Medical): No   Lack of Transportation (Non-Medical): No  Physical Activity: Unknown   Days of Exercise per Week: 0 days   Minutes of Exercise per Session: Not on file  Stress: No Stress Concern Present   Feeling of Stress : Only a little  Social Connections: Moderately Isolated   Frequency of Communication with Friends and Family: More than three times a week   Frequency of Social Gatherings with Friends and Family: Once a week   Attends Religious Services: Never   Marine scientist or Organizations: No   Attends Music therapist: Not on file   Marital Status:  Married   Past Surgical History:  Procedure Laterality Date   APPLICATION OF WOUND VAC N/A 03/25/2021   Procedure: APPLICATION OF WOUND VAC;  Surgeon: [name], MD;  Location: [facility];  Service: Orthopedics;  Laterality: N/A;   APPLICATION OF WOUND VAC N/A 03/30/2021   Procedure: WOUND VAC CHANGE 12x6x5;  Surgeon: [name], MD;  Location: [facility];  Service: Orthopedics;  Laterality: N/A;   INCISION AND DRAINAGE OF WOUND N/A 03/25/2021   Procedure: LUMBAR POST OP INCISION IRRIGATION;  Surgeon: [name], MD;  Location: [facility];  Service: Orthopedics;  Laterality: N/A;   JOINT REPLACEMENT     KNEE ARTHROSCOPY     LUMBAR WOUND DEBRIDEMENT N/A 03/30/2021   Procedure: REPEAT LUMBAR WOUND DEBRIDEMENT;  Surgeon: [name], MD;  Location: [facility]  OR;  Service: Orthopedics;  Laterality: N/A;   right rotator cuff     ROTATOR CUFF REPAIR Left    TOTAL KNEE ARTHROPLASTY Left 09/08/2020   Procedure: LEFT TOTAL KNEE ARTHROPLASTY;  Surgeon: Leandrew Koyanagi, MD;  Location: [facility];  Service: Orthopedics;  Laterality: Left;   TUBAL LIGATION     Past Medical History:  Diagnosis Date   Chronic kidney disease    Depression    Fibromyalgia    Gout    HTN (hypertension)    Insomnia    Morbid obesity ([place])    Osteoarthritis    Palpitation    Sleep apnea    There were no vitals taken for this visit.  Opioid Risk Score:   Fall Risk Score:  `1  Depression screen PHQ 2/9  Depression screen [facility] 2/9 05/19/2021 04/14/2021 03/05/2021 02/06/2021 11/07/2020 04/22/2020 04/17/2020  Decreased Interest 1 0 0 0 0 0 0  Down, Depressed, Hopeless 3 0 0 0 0 0 0  PHQ - 2 Score 4 0 0 0 0 0 0  Altered sleeping 3 - 0 - - 1 3  Tired, decreased energy 2 - 0 - - 0 0  Change in appetite 2 - 0 - - 0 0  Feeling bad or failure about yourself  0 - 0 - - 0 0  Trouble concentrating 0 - 0 - - 0 0  Moving slowly or fidgety/restless 0 - 0 - - 0 0  Suicidal thoughts 0 - 0 - - 0 0  PHQ-9 Score 11 - 0 - - 1 3  Difficult doing  work/chores - - Not difficult at all - - Not difficult at all Not difficult at all   Review of Systems  Constitutional: Negative.   HENT: Negative.    Eyes: Negative.   Respiratory: Negative.    Cardiovascular: Negative.   Gastrointestinal: Negative.   Endocrine: Negative.   Genitourinary: Negative.   Musculoskeletal:  Positive for arthralgias, back pain and gait problem. Negative for joint swelling, myalgias, neck pain and neck stiffness.       Pain in both legs  Allergic/Immunologic: Negative.   Hematological: Negative.   All other systems reviewed and are negative.    Objective:   Physical Exam  Not performed as patient was seen via phone.     Assessment & Plan:  Female with past medical history/past surgical history of OSA, morbid obesity, hypertension, fibromyalgia, depression, gout, CKD, degenerative disc disease-lumbar, bilateral knee surgeries for torn mensici, bilateral shoulder surgeries for rotator cuff presents with bilateral L > R knee pain.   1. Bilateral knee OA - endstage  Patient was supposed to have knee replacements last year, but due to insurance changed to summer 2022  Endstage OA in b/l knee per Ortho, no films available  No benefit with Heat/Cold, PT (~2019), Robaxin, Flexaril, Tizanidine  Continue bracing prn (limit on right knee - educated)  Continue Voltaren gel  Lidocaine patch OTC, does not want to try, wanted to focus on generalized pain  Continue Cymbalta   Patient states main goal is to ambulate  Wants to buy a pool - afraid of public locations due to [place]  Recommended follow up with Nephro regarding Chondroitin sulfate, appointment next month  Tolerating mild exercise - limited by back   Continue antiinflammatory diet -Discussed following foods that may reduce pain: 1) Ginger (especially studied for arthritis)- reduce leukotriene production to decrease inflammation 2) Blueberries- high in phytonutrients that decrease inflammation 3) Salmon-  marine  omega-3s reduce joint swelling and pain 4) Pumpkin seeds- reduce inflammation 5) dark chocolate- reduces inflammation 6) turmeric- reduces inflammation 7) tart cherries - reduce pain and stiffness 8) extra virgin olive oil - its compound olecanthal helps to block prostaglandins  9) chili peppers- can be eaten or applied topically via capsaicin 10) mint- helpful for headache, muscle aches, joint pain, and itching 11) garlic- reduces inflammation  Link to further information on diet for chronic pain: http://[email or web address].randall.com/   2. Sleep disturbance  Continue Lunesta per PCP  Continue to await CPAP  Insomnia: -Try to go outside near sunrise -Get exercise during the day.  -Turn off all devices an hour before bedtime.  -Teas that can benefit: chamomile, valerian root, Brahmi (Bacopa) -Can consider over the counter melatonin or magnesium glycinate (latter can also help with constipation) -Pistachios naturally increase the production of melatonin     3. Morbid Obesity  Continue follow up with dietitian  Encouraged weight loss  4. Myalgia   Will consider trigger point injections  5. Fibromyalgia  Continue  aquatic therapy - great benefit  Encouraged ROM, stretching  Continue Cymbalta  Decrease Lyrica to '50mg'$  TID  6) Wound related pain -discussed her current regimen of 2 hyrdocodone per day- she feels this would be enough to help her through her wound vac dressing changes and allow her to function better -discussed maximizing tylenol which she is already doing  -avoid NSAIDs due to only one kidney present -discussed wean to one, then no opioids after wound vac removal  12 minutes spent in discussion of her wound-related pain, prescribing 2 hydrocodone per day for her pain until her wound vac removal, discussion of alternative medications- she is already maximizing tylenol

## 2021-06-01 ENCOUNTER — Encounter: Payer: Self-pay | Admitting: Orthopaedic Surgery

## 2021-06-02 ENCOUNTER — Ambulatory Visit: Payer: Medicare Other | Admitting: Family Medicine

## 2021-06-03 ENCOUNTER — Other Ambulatory Visit: Payer: Self-pay | Admitting: Rheumatology

## 2021-06-03 ENCOUNTER — Other Ambulatory Visit: Payer: Self-pay | Admitting: Internal Medicine

## 2021-06-03 DIAGNOSIS — M0579 Rheumatoid arthritis with rheumatoid factor of multiple sites without organ or systems involvement: Secondary | ICD-10-CM

## 2021-06-03 DIAGNOSIS — Z79899 Other long term (current) drug therapy: Secondary | ICD-10-CM

## 2021-06-04 ENCOUNTER — Encounter: Payer: Self-pay | Admitting: Internal Medicine

## 2021-06-04 MED ORDER — METRONIDAZOLE 500 MG PO TABS: 500.0000 mg | ORAL_TABLET | Freq: Two times a day (BID) | ORAL | 5 refills | Status: DC

## 2021-06-11 ENCOUNTER — Encounter: Payer: Self-pay | Admitting: Internal Medicine

## 2021-06-11 ENCOUNTER — Other Ambulatory Visit: Payer: Self-pay

## 2021-06-11 ENCOUNTER — Ambulatory Visit (INDEPENDENT_AMBULATORY_CARE_PROVIDER_SITE_OTHER): Payer: Commercial Managed Care - PPO | Admitting: Internal Medicine

## 2021-06-11 VITALS — BP 114/68 | HR 79 | Temp 97.6°F | Wt 206.0 lb

## 2021-06-11 DIAGNOSIS — M4626 Osteomyelitis of vertebra, lumbar region: Secondary | ICD-10-CM

## 2021-06-11 DIAGNOSIS — N183 Chronic kidney disease, stage 3 unspecified: Secondary | ICD-10-CM

## 2021-06-11 DIAGNOSIS — B966 Bacteroides fragilis [B. fragilis] as the cause of diseases classified elsewhere: Secondary | ICD-10-CM | POA: Diagnosis not present

## 2021-06-11 DIAGNOSIS — B964 Proteus (mirabilis) (morganii) as the cause of diseases classified elsewhere: Secondary | ICD-10-CM | POA: Diagnosis not present

## 2021-06-11 NOTE — Progress Notes (Signed)
? ?   ? ? ? ? ?Patient Active Problem List  ? Diagnosis Date Noted  ? Acute on chronic renal insufficiency 03/27/2021  ? Postoperative complication of skin involving drainage from surgical wound 03/25/2021  ? Post op infection 03/24/2021  ? S/P lumbar fusion 03/24/2021  ? Lumbar stenosis 03/09/2021  ? Degenerative spondylolisthesis 02/19/2021  ? Primary osteoarthritis of left knee 09/08/2020  ? DJD (degenerative joint disease) of knee 09/08/2020  ? Status post total left knee replacement 09/08/2020  ? Spondylosis without myelopathy or radiculopathy, lumbar region 07/17/2020  ? Chronic pain syndrome 07/17/2020  ? Myalgia 07/17/2020  ? OSA (obstructive sleep apnea) 05/19/2020  ? 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/17/2020  ? Sleep disturbance 04/17/2020  ? Chronic pain of both shoulders 03/03/2020  ? Pain in both hands 03/03/2020  ? Primary osteoarthritis of both knees 03/03/2020  ? Pain in both feet 03/03/2020  ? History of chronic kidney disease 03/03/2020  ? DDD (degenerative disc disease), lumbar 02/05/2020  ? Vitamin D deficiency 01/09/2020  ? Hyperlipidemia 01/09/2020  ? CKD (chronic kidney disease) stage 3, GFR 30-59 ml/min (HCC) 01/09/2020  ? HTN (hypertension)   ? Morbid obesity ([place])   ? Gout   ? Fibromyalgia   ? Depression   ? Insomnia   ? ? ?Patient's Medications  ?New Prescriptions  ? No medications on file  ?Previous Medications  ? ALLOPURINOL (ZYLOPRIM) 100 MG TABLET    TAKE 2 TABLETS BY MOUTH EVERY DAY  ? AMLODIPINE (NORVASC) 5 MG TABLET    Take 1 tablet (5 mg total) by mouth daily.  ? ATORVASTATIN (LIPITOR) 10 MG TABLET    TAKE 1 TABLET BY MOUTH EVERYDAY AT BEDTIME  ? DIAZEPAM (VALIUM) 5 MG TABLET    Take 1 by mouth 1 hour  pre-procedure with very light food. May bring 2nd tablet to appointment.  ? DULOXETINE (CYMBALTA) 30 MG CAPSULE    TAKE 1 CAPSULE BY MOUTH EVERY DAY  ? ESZOPICLONE (LUNESTA) 2 MG TABS TABLET    TAKE 1 TABLET BY MOUTH AT NIGHT IMMEDIATELY BEFORE BEDTIME  ?  FLUTICASONE (FLONASE) 50 MCG/ACT NASAL SPRAY    Place 1 spray into both nostrils daily as needed for allergies.  ? HYDROCODONE-ACETAMINOPHEN (NORCO) 7.5-325 MG TABLET    Take 1 tablet by mouth every 4 (four) hours as needed for moderate pain.  ? HYDROCODONE-ACETAMINOPHEN (NORCO) 7.5-325 MG TABLET    Take 1-2 tablets by mouth every 12 (twelve) hours as needed for moderate pain.  ? HYDROCODONE-ACETAMINOPHEN (NORCO) 7.5-325 MG TABLET    Take 1 tablet by mouth 2 (two) times daily as needed for moderate pain.  ? HYDROXYCHLOROQUINE (PLAQUENIL) 200 MG TABLET    Take 1 tablet (200 mg total) by mouth daily.  ? LORATADINE (CLARITIN) 10 MG TABLET    Take 10 mg by mouth daily as needed for allergies.  ? LOSARTAN (COZAAR) 50 MG TABLET    Take 1 tablet (50 mg total) by mouth daily.  ? METOCLOPRAMIDE (REGLAN) 5 MG TABLET    Take 1-2 tablets (5-10 mg total) by mouth every 8 (eight) hours as needed for nausea (if ondansetron (ZOFRAN) ineffective.).  ? METRONIDAZOLE (FLAGYL) 500 MG TABLET    Take 1 tablet (500 mg total) by mouth 2 (two) times daily.  ? ONDANSETRON (ZOFRAN) 4 MG TABLET    Take 1 tablet (4 mg total) by mouth every 8 (eight) hours as needed for nausea or vomiting.  ? ONDANSETRON (ZOFRAN) 4 MG TABLET  Take 1 tablet (4 mg total) by mouth every 8 (eight) hours as needed for nausea or vomiting.  ? PREDNISONE (DELTASONE) 10 MG TABLET    Take 10 mg by mouth as needed.  ? PREGABALIN (LYRICA) 75 MG CAPSULE    Take 1 capsule (75 mg total) by mouth 3 (three) times daily.  ? TIZANIDINE (ZANAFLEX) 4 MG TABLET    Take 1 tablet (4 mg total) by mouth every 8 (eight) hours as needed for muscle spasms.  ?Modified Medications  ? No medications on file  ?Discontinued Medications  ? No medications on file  ? ? ?Subjective: ?71 year old female with obesity, CKD, L4-L5 stenosis anterolisthesis status post L4-5 transforaminal lumbar interbody fusion with pedicle instrumentation cage lateral fusion right L5-S1 microdiscectomy on 03/09/2021.   Patient developed drainage from surgical site infection hospitalized underwent I&D(03/25/21) of lumbar incision.  OR cultures positive for Enterobacter aeruginosa, Morganella and Bacteroides fragilis.  She underwent repeat debridement in the OR due to purulent drainage on 03/30/21. Patient was initially on cefazolin transitioned to cefpime then metronidazole added.  Plan was to complete 2 weeks of IV therapy with cefepime and metronidazole (04/14/21) then transition to levaquin and metro to complete 8 weeks of antibiotics from OR on 03/30/21. She does have retained hardware as suuch will need suppressive antibiotics with metro and levaquin.  ?Interval 1/24: Changed to levaquin and metronidazole ?05/19/21:Wound vac in place. Followed by Dr. Ames Coupe). Pt reports tolerating levaquin and metronidazole.  ?06/11/21 ? ?Review of Systems: ?ROS ? ?Past Medical History:  ?Diagnosis Date  ? Chronic kidney disease   ? Depression   ? Fibromyalgia   ? Gout   ? HTN (hypertension)   ? Insomnia   ? Morbid obesity ([place])   ? Osteoarthritis   ? Palpitation   ? Sleep apnea   ? ? ?Social History  ? ?Tobacco Use  ? Smoking status: Never  ? Smokeless tobacco: Never  ?Vaping Use  ? Vaping Use: Never used  ?Substance Use Topics  ? Alcohol use: Never  ?  Alcohol/week: 0.0 standard drinks  ? Drug use: Never  ? ? ?Family History  ?Problem Relation Age of Onset  ? Hypothyroidism Mother   ? Hyperlipidemia Other   ? Kidney failure Father   ? Dementia Father   ? Gout Father   ? Arthritis Father   ? Prostate cancer Father   ? ? ?Allergies  ?Allergen Reactions  ? Cefuroxime Axetil Rash and Hives  ? Clarithromycin Rash and Hives  ?  Other reaction(s): Unknown  ? Nsaids Nausea And Vomiting  ?  Other reaction(s): Unknown  ? Cefuroxime   ?  Other reaction(s): Unknown  ? Oxycodone Other (See Comments)  ?  hallucinations  ? Oxycodone-Acetaminophen Other (See Comments)  ?  Hallucination ?Other reaction(s): Unknown  ? Robaxin [Methocarbamol] Nausea And  Vomiting  ?  "It tears my stomach up."  ? Toradol [Ketorolac Tromethamine] Nausea And Vomiting  ? Tramadol Nausea And Vomiting and Other (See Comments)  ?  Upset stomach ?Other reaction(s): Unknown  ? ? ?Health Maintenance  ?Topic Date Due  ? Hepatitis C Screening  Never done  ? Zoster Vaccines- Shingrix (1 of 2) Never done  ? COVID-19 Vaccine (4 - Booster for Moderna series) 03/19/2020  ? MAMMOGRAM  01/01/2023  ? COLONOSCOPY (Pts 45-23yr Insurance coverage will need to be confirmed)  01/04/2024  ? TETANUS/TDAP  01/07/2030  ? Pneumonia Vaccine 69 Years old  Completed  ? INFLUENZA VACCINE  Completed  ?  DEXA SCAN  Completed  ? HPV VACCINES  Aged Out  ? ? ?Objective: ? ?There were no vitals filed for this visit. ?There is no height or weight on file to calculate BMI. ? ?Physical Exam ? ?Lab Results ?Lab Results  ?Component Value Date  ? WBC 5.3 05/19/2021  ? HGB 10.7 (L) 05/19/2021  ? HCT 33.5 (L) 05/19/2021  ? MCV 86.3 05/19/2021  ? PLT 363 05/19/2021  ?  ?Lab Results  ?Component Value Date  ? CREATININE 1.04 05/19/2021  ? BUN 13 05/19/2021  ? NA 141 05/19/2021  ? K 4.4 05/19/2021  ? CL 106 05/19/2021  ? CO2 29 05/19/2021  ?  ?Lab Results  ?Component Value Date  ? ALT 10 05/19/2021  ? AST 18 05/19/2021  ? ALKPHOS 130 (H) 03/30/2021  ? BILITOT 0.3 05/19/2021  ?  ?Lab Results  ?Component Value Date  ? CHOL 169 09/03/2020  ? HDL 70.70 09/03/2020  ? [place] 90 09/03/2020  ? TRIG 42.0 09/03/2020  ? CHOLHDL 2 09/03/2020  ? ?No results found for: LABRPR, RPRTITER ?No results found for: HIV1RNAQUANT, HIV1RNAVL, CD4TABS ?  ?A/P ?#Lumbar wound infection with retained hardware SP I&D on 03/25/21  ?#OR Cx from 03/25/21 +  enterobacter aeruginosa, Morganella morganii,  bacteroides fragilis(beta lactamase positive) ?#CKD Stage III ?-Completed 2 weeks of cefepime from OR on 1/24 and started on levaquin '750mg'$  PO q48h(renally dosed) and continue metronidazole '500mg'$  PO bid(EOT 05/25/21). ?-Transitioned to suppressive metronidazole and  levaquin('250mg'$  PO daily ->renally dosed) following 8 weeks of antibiotics on 05/25/21.  ?-Wound vac still in place-Dr. Lorin Mercy follow-up on 06/19/21 ?Plan: ?-Continue suppressive levaquin '250mg'$  PO qd(renally dosed-may need

## 2021-06-15 ENCOUNTER — Encounter: Payer: Self-pay | Admitting: Orthopaedic Surgery

## 2021-06-15 LAB — CBC WITH DIFFERENTIAL/PLATELET
Absolute Monocytes: 572 cells/uL (ref 200–950)
Basophils Absolute: 21 cells/uL (ref 0–200)
Basophils Relative: 0.4 %
Eosinophils Absolute: 170 cells/uL (ref 15–500)
Eosinophils Relative: 3.2 %
HCT: 35.2 % (ref 35.0–45.0)
Hemoglobin: 11.3 g/dL — ABNORMAL LOW (ref 11.7–15.5)
Lymphs Abs: 1892 cells/uL (ref 850–3900)
MCH: 27.6 pg (ref 27.0–33.0)
MCHC: 32.1 g/dL (ref 32.0–36.0)
MCV: 86.1 fL (ref 80.0–100.0)
MPV: 10 fL (ref 7.5–12.5)
Monocytes Relative: 10.8 %
Neutro Abs: 2645 cells/uL (ref 1500–7800)
Neutrophils Relative %: 49.9 %
Platelets: 354 10*3/uL (ref 140–400)
RBC: 4.09 10*6/uL (ref 3.80–5.10)
RDW: 14.9 % (ref 11.0–15.0)
Total Lymphocyte: 35.7 %
WBC: 5.3 10*3/uL (ref 3.8–10.8)

## 2021-06-15 LAB — SEDIMENTATION RATE: Sed Rate: 38 mm/h — ABNORMAL HIGH (ref 0–30)

## 2021-06-15 LAB — C-REACTIVE PROTEIN: CRP: 11 mg/L — ABNORMAL HIGH (ref <8.0)

## 2021-06-16 NOTE — Telephone Encounter (Signed)
I left voicemail for [place] requesting return call to let us know the reason she needs to draw CMP (per Dr. Lorin Mercy) and to make sure I have the correct fax information in which to send the order. ? ?[phone] -- Duwayne Heck  ?Fax # [phone] ?

## 2021-06-18 ENCOUNTER — Encounter: Payer: Self-pay | Admitting: Family Medicine

## 2021-06-19 ENCOUNTER — Encounter: Payer: Self-pay | Admitting: Orthopaedic Surgery

## 2021-06-19 ENCOUNTER — Ambulatory Visit (INDEPENDENT_AMBULATORY_CARE_PROVIDER_SITE_OTHER): Payer: Commercial Managed Care - PPO | Admitting: Orthopaedic Surgery

## 2021-06-19 VITALS — BP 137/78 | Ht 62.0 in | Wt 206.0 lb

## 2021-06-19 DIAGNOSIS — Z981 Arthrodesis status: Secondary | ICD-10-CM

## 2021-06-19 DIAGNOSIS — L7682 Other postprocedural complications of skin and subcutaneous tissue: Secondary | ICD-10-CM

## 2021-06-19 NOTE — Progress Notes (Deleted)
? ?Post-Op Visit Note ?  ?[name]           ?Date of Birth: [birth year]           ?MRN: [id number] ?Visit Date: 06/19/2021 ?PCP: Isaac Bliss, [name], MD ? ? ?Assessment & Plan: ? ?Chief Complaint:  ?Chief Complaint  ?Patient presents with  ? Lower Back - Wound Check  ?  03/30/2021 repeat lumbar wound debridement, wound vac application  ? ?Visit Diagnoses: No diagnosis found. ? ?Plan: *** ? ?Follow-Up Instructions: Return in about 4 days (around 06/23/2021).  ? ?Orders:  ?No orders of the defined types were placed in this encounter. ? ?No orders of the defined types were placed in this encounter. ? ? ?Imaging: ?No results found. ? ?PMFS History: ?Patient Active Problem List  ? Diagnosis Date Noted  ? Acute on chronic renal insufficiency 03/27/2021  ? Postoperative complication of skin involving drainage from surgical wound 03/25/2021  ? Post op infection 03/24/2021  ? S/P lumbar fusion 03/24/2021  ? Lumbar stenosis 03/09/2021  ? Degenerative spondylolisthesis 02/19/2021  ? Primary osteoarthritis of left knee 09/08/2020  ? DJD (degenerative joint disease) of knee 09/08/2020  ? Status post total left knee replacement 09/08/2020  ? Spondylosis without myelopathy or radiculopathy, lumbar region 07/17/2020  ? Chronic pain syndrome 07/17/2020  ? Myalgia 07/17/2020  ? OSA (obstructive sleep apnea) 05/19/2020  ? 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/17/2020  ? Sleep disturbance 04/17/2020  ? Chronic pain of both shoulders 03/03/2020  ? Pain in both hands 03/03/2020  ? Primary osteoarthritis of both knees 03/03/2020  ? Pain in both feet 03/03/2020  ? History of chronic kidney disease 03/03/2020  ? DDD (degenerative disc disease), lumbar 02/05/2020  ? Vitamin D deficiency 01/09/2020  ? Hyperlipidemia 01/09/2020  ? CKD (chronic kidney disease) stage 3, GFR 30-59 ml/min (HCC) 01/09/2020  ? HTN (hypertension)   ? Morbid obesity ([place])   ? Gout   ? Fibromyalgia   ? Depression   ? Insomnia    ? ?Past Medical History:  ?Diagnosis Date  ? Chronic kidney disease   ? Depression   ? Fibromyalgia   ? Gout   ? HTN (hypertension)   ? Insomnia   ? Morbid obesity ([place])   ? Osteoarthritis   ? Palpitation   ? Sleep apnea   ?  ?Family History  ?Problem Relation Age of Onset  ? Hypothyroidism Mother   ? Hyperlipidemia Other   ? Kidney failure Father   ? Dementia Father   ? Gout Father   ? Arthritis Father   ? Prostate cancer Father   ?  ?Past Surgical History:  ?Procedure Laterality Date  ? APPLICATION OF WOUND VAC N/A 03/25/2021  ? Procedure: APPLICATION OF WOUND VAC;  Surgeon: [name], MD;  Location: [facility];  Service: Orthopedics;  Laterality: N/A;  ? APPLICATION OF WOUND VAC N/A 03/30/2021  ? Procedure: WOUND VAC CHANGE 12x6x5;  Surgeon: [name], MD;  Location: [facility];  Service: Orthopedics;  Laterality: N/A;  ? INCISION AND DRAINAGE OF WOUND N/A 03/25/2021  ? Procedure: LUMBAR POST OP INCISION IRRIGATION;  Surgeon: [name], MD;  Location: [facility];  Service: Orthopedics;  Laterality: N/A;  ? JOINT REPLACEMENT    ? KNEE ARTHROSCOPY    ? LUMBAR WOUND DEBRIDEMENT N/A 03/30/2021  ? Procedure: REPEAT LUMBAR WOUND DEBRIDEMENT;  Surgeon: [name], MD;  Location: [facility];  Service: Orthopedics;  Laterality: N/A;  ? right  rotator cuff    ? ROTATOR CUFF REPAIR Left   ? TOTAL KNEE ARTHROPLASTY Left 09/08/2020  ? Procedure: LEFT TOTAL KNEE ARTHROPLASTY;  Surgeon: Leandrew Koyanagi, MD;  Location: [facility];  Service: Orthopedics;  Laterality: Left;  ? TUBAL LIGATION    ? ?Social History  ? ?Occupational History  ? 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 Smoking status: Never  ? Smokeless tobacco: Never  ?Vaping Use  ? Vaping Use: Never used  ?Substance and Sexual Activity  ? Alcohol use: Never  ?  Alcohol/week: 0.0 standard drinks  ? Drug use: Never  ? Sexual activity: Never  ? ? ? ?

## 2021-06-20 ENCOUNTER — Encounter: Payer: Self-pay | Admitting: Orthopaedic Surgery

## 2021-06-21 NOTE — Progress Notes (Signed)
? ?Post-Op Visit Note ?  ?[name]           ?Date of Birth: Mar 10, 1951           ?MRN: [id number] ?Visit Date: 06/19/2021 ?PCP: Isaac Bliss, [name], MD ? ? ?Assessment & Plan: Patient returns back removed.  Granulation tissue has filled then.  She might be a couple millimeters deep difficult to determine exactly in the midline.  We will discontinue the VAC.  Silver nitrate applied.  Dressing applied she can change it as needed and I will recheck her early next week.  Patient remains on Levaquin 250 p.o. daily and metronidazole 500 mg p.o. twice daily. ? ?Chief Complaint:  ?Chief Complaint  ?Patient presents with  ? Lower Back - Wound Check  ?  03/30/2021 repeat lumbar wound debridement, wound vac application  ? ?Visit Diagnoses:  ?1. Postoperative complication of skin involving drainage from surgical wound   ?2. S/P lumbar fusion   ? ? ?Plan: Return 4 days. ? ?Follow-Up Instructions: Return in about 4 days (around 06/23/2021).  ? ?Orders:  ?No orders of the defined types were placed in this encounter. ? ?No orders of the defined types were placed in this encounter. ? ? ?Imaging: ?No results found. ? ?PMFS History: ?Patient Active Problem List  ? Diagnosis Date Noted  ? Acute on chronic renal insufficiency 03/27/2021  ? Postoperative complication of skin involving drainage from surgical wound 03/25/2021  ? Post op infection 03/24/2021  ? S/P lumbar fusion 03/24/2021  ? Lumbar stenosis 03/09/2021  ? Degenerative spondylolisthesis 02/19/2021  ? Primary osteoarthritis of left knee 09/08/2020  ? DJD (degenerative joint disease) of knee 09/08/2020  ? Status post total left knee replacement 09/08/2020  ? Spondylosis without myelopathy or radiculopathy, lumbar region 07/17/2020  ? Chronic pain syndrome 07/17/2020  ? Myalgia 07/17/2020  ? OSA (obstructive sleep apnea) 05/19/2020  ? 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/17/2020  ? Sleep disturbance 04/17/2020  ? Chronic pain of both  shoulders 03/03/2020  ? Pain in both hands 03/03/2020  ? Primary osteoarthritis of both knees 03/03/2020  ? Pain in both feet 03/03/2020  ? History of chronic kidney disease 03/03/2020  ? DDD (degenerative disc disease), lumbar 02/05/2020  ? Vitamin D deficiency 01/09/2020  ? Hyperlipidemia 01/09/2020  ? CKD (chronic kidney disease) stage 3, GFR 30-59 ml/min (HCC) 01/09/2020  ? HTN (hypertension)   ? Morbid obesity ([place])   ? Gout   ? Fibromyalgia   ? Depression   ? Insomnia   ? ?Past Medical History:  ?Diagnosis Date  ? Chronic kidney disease   ? Depression   ? Fibromyalgia   ? Gout   ? HTN (hypertension)   ? Insomnia   ? Morbid obesity ([place])   ? Osteoarthritis   ? Palpitation   ? Sleep apnea   ?  ?Family History  ?Problem Relation Age of Onset  ? Hypothyroidism Mother   ? Hyperlipidemia Other   ? Kidney failure Father   ? Dementia Father   ? Gout Father   ? Arthritis Father   ? Prostate cancer Father   ?  ?Past Surgical History:  ?Procedure Laterality Date  ? APPLICATION OF WOUND VAC N/A 03/25/2021  ? Procedure: APPLICATION OF WOUND VAC;  Surgeon: [name], MD;  Location: [facility];  Service: Orthopedics;  Laterality: N/A;  ? APPLICATION OF WOUND VAC N/A 03/30/2021  ? Procedure: WOUND VAC CHANGE 12x6x5;  Surgeon: [name], MD;  Location:  [facility];  Service: Orthopedics;  Laterality: N/A;  ? INCISION AND DRAINAGE OF WOUND N/A 03/25/2021  ? Procedure: LUMBAR POST OP INCISION IRRIGATION;  Surgeon: [name], MD;  Location: [facility];  Service: Orthopedics;  Laterality: N/A;  ? JOINT REPLACEMENT    ? KNEE ARTHROSCOPY    ? LUMBAR WOUND DEBRIDEMENT N/A 03/30/2021  ? Procedure: REPEAT LUMBAR WOUND DEBRIDEMENT;  Surgeon: [name], MD;  Location: [facility];  Service: Orthopedics;  Laterality: N/A;  ? right rotator cuff    ? ROTATOR CUFF REPAIR Left   ? TOTAL KNEE ARTHROPLASTY Left 09/08/2020  ? Procedure: LEFT TOTAL KNEE ARTHROPLASTY;  Surgeon: Leandrew Koyanagi, MD;  Location: [place] [facility];  Service: Orthopedics;  Laterality: Left;  ?  TUBAL LIGATION    ? ?Social History  ? ?Occupational History  ? 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 Smoking status: Never  ? Smokeless tobacco: Never  ?Vaping Use  ? Vaping Use: Never used  ?Substance and Sexual Activity  ? Alcohol use: Never  ?  Alcohol/week: 0.0 standard drinks  ? Drug use: Never  ? Sexual activity: Never  ? ? ? ?

## 2021-06-22 NOTE — Telephone Encounter (Signed)
noted 

## 2021-06-23 ENCOUNTER — Ambulatory Visit (INDEPENDENT_AMBULATORY_CARE_PROVIDER_SITE_OTHER): Payer: Commercial Managed Care - PPO | Admitting: Orthopaedic Surgery

## 2021-06-23 ENCOUNTER — Encounter: Payer: Self-pay | Admitting: Orthopaedic Surgery

## 2021-06-23 VITALS — BP 149/72 | HR 80 | Ht 62.0 in | Wt 206.0 lb

## 2021-06-23 DIAGNOSIS — L7682 Other postprocedural complications of skin and subcutaneous tissue: Secondary | ICD-10-CM

## 2021-06-23 DIAGNOSIS — Z981 Arthrodesis status: Secondary | ICD-10-CM

## 2021-06-23 MED ORDER — HYDROXYZINE PAMOATE 100 MG PO CAPS: 100.0000 mg | ORAL_CAPSULE | Freq: Three times a day (TID) | ORAL | 0 refills | Status: DC | PRN

## 2021-06-23 NOTE — Addendum Note (Signed)
Addended by: [name] on: 06/23/2021 03:13 PM ? ? Modules accepted: Orders ? ?

## 2021-06-23 NOTE — Progress Notes (Addendum)
? ?Post-Op Visit Note ?  ?[name]           ?Date of Birth: 1950/11/15           ?MRN: [id number] ?Visit Date: 06/23/2021 ?PCP: Isaac Bliss, [name], MD ? ? ?Assessment & Plan: Post lumbar decompression VAC for wound dehiscence with low albumin.  Silver nitrate applied again today I will recheck her in 1 week dry dressing applied she has had problems with tape and we will use paper tape today.  Patient is happy with the results of surgery for her fusion.  As patient was leaving she states itching still bothering her and she is requesting Atarax.  I discussed with her she needs to stop her pain medication which will help the itching problem. ? ?Chief Complaint:  ?Chief Complaint  ?Patient presents with  ? Lower Back - Wound Check  ?  03/30/2021 repeat lumbar wound debridement, wound vac application  ? ?Visit Diagnoses:  ?1. Postoperative complication of skin involving drainage from surgical wound   ?2. S/P lumbar fusion   ? ? ?Plan: Return in 1 week for wound check. ? ?Follow-Up Instructions: No follow-ups on file.  ? ?Orders:  ?No orders of the defined types were placed in this encounter. ? ?No orders of the defined types were placed in this encounter. ? ? ?Imaging: ?No results found. ? ?PMFS History: ?Patient Active Problem List  ? Diagnosis Date Noted  ? Acute on chronic renal insufficiency 03/27/2021  ? Postoperative complication of skin involving drainage from surgical wound 03/25/2021  ? Post op infection 03/24/2021  ? S/P lumbar fusion 03/24/2021  ? Lumbar stenosis 03/09/2021  ? Degenerative spondylolisthesis 02/19/2021  ? Primary osteoarthritis of left knee 09/08/2020  ? DJD (degenerative joint disease) of knee 09/08/2020  ? Status post total left knee replacement 09/08/2020  ? Spondylosis without myelopathy or radiculopathy, lumbar region 07/17/2020  ? Chronic pain syndrome 07/17/2020  ? Myalgia 07/17/2020  ? OSA (obstructive sleep apnea) 05/19/2020  ? Bilateral post-traumatic  osteoarthritis of knee 04/17/2020  ? Sleep disturbance 04/17/2020  ? Chronic pain of both shoulders 03/03/2020  ? Pain in both hands 03/03/2020  ? Primary osteoarthritis of both knees 03/03/2020  ? Pain in both feet 03/03/2020  ? History of chronic kidney disease 03/03/2020  ? DDD (degenerative disc disease), lumbar 02/05/2020  ? Vitamin D deficiency 01/09/2020  ? Hyperlipidemia 01/09/2020  ? CKD (chronic kidney disease) stage 3, GFR 30-59 ml/min (HCC) 01/09/2020  ? HTN (hypertension)   ? Morbid obesity ([place])   ? Gout   ? Fibromyalgia   ? Depression   ? Insomnia   ? ?Past Medical History:  ?Diagnosis Date  ? Chronic kidney disease   ? Depression   ? Fibromyalgia   ? Gout   ? HTN (hypertension)   ? Insomnia   ? Morbid obesity ([place])   ? Osteoarthritis   ? Palpitation   ? Sleep apnea   ?  ?Family History  ?Problem Relation Age of Onset  ? Hypothyroidism Mother   ? Hyperlipidemia Other   ? Kidney failure Father   ? Dementia Father   ? Gout Father   ? Arthritis Father   ? Prostate cancer Father   ?  ?Past Surgical History:  ?Procedure Laterality Date  ? APPLICATION OF WOUND VAC N/A 03/25/2021  ? Procedure: APPLICATION OF WOUND VAC;  Surgeon: [name], MD;  Location: [facility];  Service: Orthopedics;  Laterality: N/A;  ? APPLICATION OF  WOUND VAC N/A 03/30/2021  ? Procedure: WOUND VAC CHANGE 12x6x5;  Surgeon: [name], MD;  Location: [facility];  Service: Orthopedics;  Laterality: N/A;  ? INCISION AND DRAINAGE OF WOUND N/A 03/25/2021  ? Procedure: LUMBAR POST OP INCISION IRRIGATION;  Surgeon: [name], MD;  Location: [facility];  Service: Orthopedics;  Laterality: N/A;  ? JOINT REPLACEMENT    ? KNEE ARTHROSCOPY    ? LUMBAR WOUND DEBRIDEMENT N/A 03/30/2021  ? Procedure: REPEAT LUMBAR WOUND DEBRIDEMENT;  Surgeon: [name], MD;  Location: [facility];  Service: Orthopedics;  Laterality: N/A;  ? right rotator cuff    ? ROTATOR CUFF REPAIR Left   ? TOTAL KNEE ARTHROPLASTY Left 09/08/2020  ? Procedure: LEFT TOTAL KNEE ARTHROPLASTY;   Surgeon: Leandrew Koyanagi, MD;  Location: [facility];  Service: Orthopedics;  Laterality: Left;  ? TUBAL LIGATION    ? ?Social History  ? ?Occupational History  ? 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 Smoking status: Never  ? Smokeless tobacco: Never  ?Vaping Use  ? Vaping Use: Never used  ?Substance and Sexual Activity  ? Alcohol use: Never  ?  Alcohol/week: 0.0 standard drinks  ? Drug use: Never  ? Sexual activity: Never  ? ? ? ?

## 2021-06-24 ENCOUNTER — Encounter: Payer: Self-pay | Admitting: Internal Medicine

## 2021-06-24 NOTE — Telephone Encounter (Signed)
I do, yes. From 3/29 - SCr 1.42, BUN 17. LFTs normal.

## 2021-06-27 ENCOUNTER — Other Ambulatory Visit: Payer: Self-pay | Admitting: Rheumatology

## 2021-06-27 DIAGNOSIS — M0579 Rheumatoid arthritis with rheumatoid factor of multiple sites without organ or systems involvement: Secondary | ICD-10-CM

## 2021-06-27 DIAGNOSIS — Z79899 Other long term (current) drug therapy: Secondary | ICD-10-CM

## 2021-07-01 ENCOUNTER — Encounter: Payer: Self-pay | Admitting: Orthopaedic Surgery

## 2021-07-01 ENCOUNTER — Ambulatory Visit: Payer: Commercial Managed Care - PPO | Admitting: Orthopaedic Surgery

## 2021-07-01 VITALS — BP 111/71 | Ht 62.0 in | Wt 206.0 lb

## 2021-07-01 DIAGNOSIS — L7682 Other postprocedural complications of skin and subcutaneous tissue: Secondary | ICD-10-CM | POA: Diagnosis not present

## 2021-07-01 DIAGNOSIS — Z981 Arthrodesis status: Secondary | ICD-10-CM | POA: Diagnosis not present

## 2021-07-01 NOTE — Progress Notes (Signed)
? ?Office Visit Note ?  ?[name]           ?Date of Birth: 1950/06/24           ?MRN: [id number] ?Visit Date: 07/01/2021 ?             ?Requested by: Isaac Bliss, [name], MD ?[address] ?[place],  [place] [id number] ?PCP: Isaac Bliss, [name], MD ? ? ?Assessment & Plan: ?Visit Diagnoses:  ?1. Postoperative complication of skin involving drainage from surgical wound   ?2. S/P lumbar fusion   ? ? ?Plan: Return 2 weeks. ? ?Follow-Up Instructions: Return in about 2 weeks (around 07/15/2021).  ? ?Orders:  ?No orders of the defined types were placed in this encounter. ? ?No orders of the defined types were placed in this encounter. ? ? ? ? Procedures: ?No procedures performed ? ? ?Clinical Data: ?No additional findings. ? ? ?Subjective: ?Chief Complaint  ?Patient presents with  ? Lower Back - Follow-up, Wound Check  ?  03/30/2021 repeat lumbar wound debridement, wound vac application  ? ? ?HPI follow-up lumbar wound debridement VAC application 6/0/6301.  Silver nitrate applied.  Wound is decreasing in size.  Wet-to-dry dressings continue. ? ?Review of Systems unchanged ? ? ?Objective: ?Vital Signs: BP 111/71   Ht '5\' 2"'$  (1.575 m)   Wt 206 lb (93.4 kg)   BMI 37.68 kg/m?  ? ?Physical Exam unchanged ? ?Ortho Exam neurologically intact lower extremities.  Amatory with a cane.  Beefy red granulation tissue present.  Wound 8 x 4 cm.  Depth only a few millimeters new silver nitrate applied. ? ?Specialty Comments:  ?No specialty comments available. ? ?Imaging: ?No results found. ? ? ?PMFS History: ?Patient Active Problem List  ? Diagnosis Date Noted  ? Acute on chronic renal insufficiency 03/27/2021  ? Postoperative complication of skin involving drainage from surgical wound 03/25/2021  ? Post op infection 03/24/2021  ? S/P lumbar fusion 03/24/2021  ? Lumbar stenosis 03/09/2021  ? Degenerative spondylolisthesis 02/19/2021  ? Primary osteoarthritis of left knee 09/08/2020  ? DJD  (degenerative joint disease) of knee 09/08/2020  ? Status post total left knee replacement 09/08/2020  ? Spondylosis without myelopathy or radiculopathy, lumbar region 07/17/2020  ? Chronic pain syndrome 07/17/2020  ? Myalgia 07/17/2020  ? OSA (obstructive sleep apnea) 05/19/2020  ? 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/17/2020  ? Sleep disturbance 04/17/2020  ? Chronic pain of both shoulders 03/03/2020  ? Pain in both hands 03/03/2020  ? Primary osteoarthritis of both knees 03/03/2020  ? Pain in both feet 03/03/2020  ? History of chronic kidney disease 03/03/2020  ? DDD (degenerative disc disease), lumbar 02/05/2020  ? Vitamin D deficiency 01/09/2020  ? Hyperlipidemia 01/09/2020  ? CKD (chronic kidney disease) stage 3, GFR 30-59 ml/min (HCC) 01/09/2020  ? HTN (hypertension)   ? Morbid obesity ([place])   ? Gout   ? Fibromyalgia   ? Depression   ? Insomnia   ? ?Past Medical History:  ?Diagnosis Date  ? Chronic kidney disease   ? Depression   ? Fibromyalgia   ? Gout   ? HTN (hypertension)   ? Insomnia   ? Morbid obesity ([place])   ? Osteoarthritis   ? Palpitation   ? Sleep apnea   ?  ?Family History  ?Problem Relation Age of Onset  ? Hypothyroidism Mother   ? Hyperlipidemia Other   ? Kidney failure Father   ? Dementia Father   ? Gout  Father   ? Arthritis Father   ? Prostate cancer Father   ?  ?Past Surgical History:  ?Procedure Laterality Date  ? APPLICATION OF WOUND VAC N/A 03/25/2021  ? Procedure: APPLICATION OF WOUND VAC;  Surgeon: [name], MD;  Location: [facility];  Service: Orthopedics;  Laterality: N/A;  ? APPLICATION OF WOUND VAC N/A 03/30/2021  ? Procedure: WOUND VAC CHANGE 12x6x5;  Surgeon: [name], MD;  Location: [facility];  Service: Orthopedics;  Laterality: N/A;  ? INCISION AND DRAINAGE OF WOUND N/A 03/25/2021  ? Procedure: LUMBAR POST OP INCISION IRRIGATION;  Surgeon: [name], MD;  Location: [facility];  Service: Orthopedics;  Laterality: N/A;  ? JOINT REPLACEMENT    ? KNEE ARTHROSCOPY    ? LUMBAR  WOUND DEBRIDEMENT N/A 03/30/2021  ? Procedure: REPEAT LUMBAR WOUND DEBRIDEMENT;  Surgeon: [name], MD;  Location: [facility];  Service: Orthopedics;  Laterality: N/A;  ? right rotator cuff    ? ROTATOR CUFF REPAIR Left   ? TOTAL KNEE ARTHROPLASTY Left 09/08/2020  ? Procedure: LEFT TOTAL KNEE ARTHROPLASTY;  Surgeon: Leandrew Koyanagi, MD;  Location: [facility];  Service: Orthopedics;  Laterality: Left;  ? TUBAL LIGATION    ? ?Social History  ? ?Occupational History  ? 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 Smoking status: Never  ? Smokeless tobacco: Never  ?Vaping Use  ? Vaping Use: Never used  ?Substance and Sexual Activity  ? Alcohol use: Never  ?  Alcohol/week: 0.0 standard drinks  ? Drug use: Never  ? Sexual activity: Never  ? ? ? ? ? ? ?

## 2021-07-15 ENCOUNTER — Encounter: Payer: Self-pay | Admitting: Orthopaedic Surgery

## 2021-07-15 ENCOUNTER — Ambulatory Visit: Payer: Commercial Managed Care - PPO | Admitting: Orthopaedic Surgery

## 2021-07-15 VITALS — BP 124/78 | HR 86 | Ht 62.0 in | Wt 206.0 lb

## 2021-07-15 DIAGNOSIS — Z981 Arthrodesis status: Secondary | ICD-10-CM

## 2021-07-15 DIAGNOSIS — L7682 Other postprocedural complications of skin and subcutaneous tissue: Secondary | ICD-10-CM | POA: Diagnosis not present

## 2021-07-15 NOTE — Progress Notes (Signed)
? ?Office Visit Note ?  ?[name]           ?Date of Birth: Aug 24, 1950           ?MRN: [id number] ?Visit Date: 07/15/2021 ?             ?Requested by: Isaac Bliss, [name], MD ?[address] ?[place],  [place] [id number] ?PCP: Isaac Bliss, [name], MD ? ? ?Assessment & Plan: ?Visit Diagnoses:  ?1. Postoperative complication of skin involving drainage from surgical wound   ?2. S/P lumbar fusion   ? ? ?Plan: Physical therapy ordered recheck 1 month.  She can stop the dressing changes once the wound is completely healed. ? ?Follow-Up Instructions: Return in about 1 month (around 08/14/2021).  ? ?Orders:  ?Orders Placed This Encounter  ?Procedures  ? Ambulatory referral to Physical Therapy  ? ?No orders of the defined types were placed in this encounter. ? ? ? ? Procedures: ?No procedures performed ? ? ?Clinical Data: ?No additional findings. ? ? ?Subjective: ?Chief Complaint  ?Patient presents with  ? Lower Back - Routine Post Op  ? ? ?HPI postop lumbar fusion with wound dehiscence with low albumin post weight loss in order to get her BMI below 40 for surgery.  She has been using some hydrocodone occasionally slowly backing down.  We applied silver nitrate to few times she states just comes off on the dressing.  Incisions about 4 mm in width with no significant depression at this point.  Continue wet-to-dry dressing changes.  We will set up for some therapy to work on range of motion strengthening balance core strengthening.  Original surgery was 03/09/2021 with wound debridement VAC placement on 1/4 and 03/30/2021.  Wound is gradually filled down from several inches deep.  I plan to recheck her in 1 month. ? ?Review of Systems no fever no chills no claudication symptoms. ? ? ?Objective: ?Vital Signs: BP 124/78   Pulse 86   Ht '5\' 2"'$  (1.575 m)   Wt 206 lb (93.4 kg)   BMI 37.68 kg/m?  ? ?Physical Exam ?Constitutional:   ?   Appearance: She is well-developed.  ?HENT:  ?   Head:  Normocephalic.  ?   Right Ear: External ear normal.  ?   Left Ear: External ear normal. There is no impacted cerumen.  ?Eyes:  ?   Pupils: Pupils are equal, round, and reactive to light.  ?Neck:  ?   Thyroid: No thyromegaly.  ?   Trachea: No tracheal deviation.  ?Cardiovascular:  ?   Rate and Rhythm: Normal rate.  ?Pulmonary:  ?   Effort: Pulmonary effort is normal.  ?Abdominal:  ?   Palpations: Abdomen is soft.  ?Musculoskeletal:  ?   Cervical back: No rigidity.  ?Skin: ?   General: Skin is warm and dry.  ?Neurological:  ?   Mental Status: She is alert and oriented to person, place, and time.  ?Psychiatric:     ?   Behavior: Behavior normal.  ? ? ?Ortho Exam incision has beefy red tissue no purulent drainage no cellulitis.  New wet-to-dry dressing applied with paper tape.  Anterior tib gastrocsoleus is intact no cellulitis. ? ?Specialty Comments:  ?No specialty comments available. ? ?Imaging: ?No results found. ? ? ?PMFS History: ?Patient Active Problem List  ? Diagnosis Date Noted  ? Acute on chronic renal insufficiency 03/27/2021  ? Postoperative complication of skin involving drainage from surgical wound 03/25/2021  ? Post op infection  03/24/2021  ? S/P lumbar fusion 03/24/2021  ? Lumbar stenosis 03/09/2021  ? Degenerative spondylolisthesis 02/19/2021  ? Primary osteoarthritis of left knee 09/08/2020  ? DJD (degenerative joint disease) of knee 09/08/2020  ? Status post total left knee replacement 09/08/2020  ? Spondylosis without myelopathy or radiculopathy, lumbar region 07/17/2020  ? Chronic pain syndrome 07/17/2020  ? Myalgia 07/17/2020  ? OSA (obstructive sleep apnea) 05/19/2020  ? 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/17/2020  ? Sleep disturbance 04/17/2020  ? Chronic pain of both shoulders 03/03/2020  ? Pain in both hands 03/03/2020  ? Primary osteoarthritis of both knees 03/03/2020  ? Pain in both feet 03/03/2020  ? History of chronic kidney disease 03/03/2020  ? DDD (degenerative disc  disease), lumbar 02/05/2020  ? Vitamin D deficiency 01/09/2020  ? Hyperlipidemia 01/09/2020  ? CKD (chronic kidney disease) stage 3, GFR 30-59 ml/min (HCC) 01/09/2020  ? HTN (hypertension)   ? Morbid obesity ([place])   ? Gout   ? Fibromyalgia   ? Depression   ? Insomnia   ? ?Past Medical History:  ?Diagnosis Date  ? Chronic kidney disease   ? Depression   ? Fibromyalgia   ? Gout   ? HTN (hypertension)   ? Insomnia   ? Morbid obesity ([place])   ? Osteoarthritis   ? Palpitation   ? Sleep apnea   ?  ?Family History  ?Problem Relation Age of Onset  ? Hypothyroidism Mother   ? Hyperlipidemia Other   ? Kidney failure Father   ? Dementia Father   ? Gout Father   ? Arthritis Father   ? Prostate cancer Father   ?  ?Past Surgical History:  ?Procedure Laterality Date  ? APPLICATION OF WOUND VAC N/A 03/25/2021  ? Procedure: APPLICATION OF WOUND VAC;  Surgeon: [name], MD;  Location: [facility];  Service: Orthopedics;  Laterality: N/A;  ? APPLICATION OF WOUND VAC N/A 03/30/2021  ? Procedure: WOUND VAC CHANGE 12x6x5;  Surgeon: [name], MD;  Location: [facility];  Service: Orthopedics;  Laterality: N/A;  ? INCISION AND DRAINAGE OF WOUND N/A 03/25/2021  ? Procedure: LUMBAR POST OP INCISION IRRIGATION;  Surgeon: [name], MD;  Location: [facility];  Service: Orthopedics;  Laterality: N/A;  ? JOINT REPLACEMENT    ? KNEE ARTHROSCOPY    ? LUMBAR WOUND DEBRIDEMENT N/A 03/30/2021  ? Procedure: REPEAT LUMBAR WOUND DEBRIDEMENT;  Surgeon: [name], MD;  Location: [facility];  Service: Orthopedics;  Laterality: N/A;  ? right rotator cuff    ? ROTATOR CUFF REPAIR Left   ? TOTAL KNEE ARTHROPLASTY Left 09/08/2020  ? Procedure: LEFT TOTAL KNEE ARTHROPLASTY;  Surgeon: Leandrew Koyanagi, MD;  Location: [facility];  Service: Orthopedics;  Laterality: Left;  ? TUBAL LIGATION    ? ?Social History  ? ?Occupational History  ? 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 Smoking status: Never  ? Smokeless tobacco: Never  ?Vaping Use  ? Vaping Use: Never used  ?Substance and Sexual  Activity  ? Alcohol use: Never  ?  Alcohol/week: 0.0 standard drinks  ? Drug use: Never  ? Sexual activity: Never  ? ? ? ? ? ? ?

## 2021-07-16 ENCOUNTER — Other Ambulatory Visit: Payer: Self-pay | Admitting: Orthopaedic Surgery

## 2021-07-16 ENCOUNTER — Encounter: Payer: Self-pay | Admitting: Physical Medicine and Rehabilitation

## 2021-07-16 DIAGNOSIS — Z981 Arthrodesis status: Secondary | ICD-10-CM

## 2021-07-17 ENCOUNTER — Other Ambulatory Visit: Payer: Self-pay | Admitting: Physical Medicine and Rehabilitation

## 2021-07-17 MED ORDER — HYDROCODONE-ACETAMINOPHEN 7.5-325 MG PO TABS: 1.0000 | ORAL_TABLET | ORAL | 0 refills | Status: DC | PRN

## 2021-07-21 ENCOUNTER — Telehealth: Payer: Self-pay

## 2021-07-21 NOTE — Telephone Encounter (Signed)
Fax from pharmacy came stating that Hydrocodone/APAP is on backorder but they have 10/325 mg. Do you want to change the strength? ?

## 2021-07-21 NOTE — Telephone Encounter (Signed)
Ok I will tell the pharmacy to cancel the 7.5/325 mg and you will have send 10/325 ng prescription in. ?

## 2021-07-22 ENCOUNTER — Other Ambulatory Visit: Payer: Self-pay | Admitting: Physical Medicine and Rehabilitation

## 2021-07-22 MED ORDER — OXYCODONE-ACETAMINOPHEN 5-325 MG PO TABS: 1.0000 | ORAL_TABLET | ORAL | 0 refills | Status: DC | PRN

## 2021-07-26 ENCOUNTER — Other Ambulatory Visit: Payer: Self-pay | Admitting: Internal Medicine

## 2021-07-26 DIAGNOSIS — I10 Essential (primary) hypertension: Secondary | ICD-10-CM

## 2021-07-27 ENCOUNTER — Encounter: Payer: Self-pay | Admitting: Internal Medicine

## 2021-07-27 ENCOUNTER — Other Ambulatory Visit: Payer: Self-pay | Admitting: Internal Medicine

## 2021-07-29 ENCOUNTER — Ambulatory Visit (INDEPENDENT_AMBULATORY_CARE_PROVIDER_SITE_OTHER): Payer: Commercial Managed Care - PPO | Admitting: Physical Therapy

## 2021-07-29 ENCOUNTER — Encounter: Payer: Self-pay | Admitting: Physical Therapy

## 2021-07-29 DIAGNOSIS — R293 Abnormal posture: Secondary | ICD-10-CM | POA: Diagnosis not present

## 2021-07-29 DIAGNOSIS — M5459 Other low back pain: Secondary | ICD-10-CM

## 2021-07-29 DIAGNOSIS — R29898 Other symptoms and signs involving the musculoskeletal system: Secondary | ICD-10-CM

## 2021-07-29 DIAGNOSIS — M6281 Muscle weakness (generalized): Secondary | ICD-10-CM | POA: Diagnosis not present

## 2021-07-29 NOTE — Therapy (Signed)
?OUTPATIENT PHYSICAL THERAPY THORACOLUMBAR EVALUATION ? ? ?Patient Name: Amy Hooper ?MRN: [id number] ?DOB:Mar 05, 1951, 71 y.o., female ?Today's Date: 07/29/2021 ? ? PT End of Session - 07/29/21 1537   ? ? Visit Number 1   ? Number of Visits 6   ? Date for PT Re-Evaluation 09/09/21   ? PT Start Time 1443   ? PT Stop Time 1520   ? PT Time Calculation (min) 37 min   ? Activity Tolerance Patient tolerated treatment well   ? Behavior During Therapy [facility] for tasks assessed/performed   ? ?  ?  ? ?  ? ? ?Past Medical History:  ?Diagnosis Date  ? Chronic kidney disease   ? Depression   ? Fibromyalgia   ? Gout   ? HTN (hypertension)   ? Insomnia   ? Morbid obesity ([place])   ? Osteoarthritis   ? Palpitation   ? Sleep apnea   ? ?Past Surgical History:  ?Procedure Laterality Date  ? APPLICATION OF WOUND VAC N/A 03/25/2021  ? Procedure: APPLICATION OF WOUND VAC;  Surgeon: [name], MD;  Location: [place];  Service: Orthopedics;  Laterality: N/A;  ? APPLICATION OF WOUND VAC N/A 03/30/2021  ? Procedure: WOUND VAC CHANGE 12x6x5;  Surgeon: [name], MD;  Location: [place];  Service: Orthopedics;  Laterality: N/A;  ? INCISION AND DRAINAGE OF WOUND N/A 03/25/2021  ? Procedure: LUMBAR POST OP INCISION IRRIGATION;  Surgeon: [name], MD;  Location: [place];  Service: Orthopedics;  Laterality: N/A;  ? JOINT REPLACEMENT    ? KNEE ARTHROSCOPY    ? LUMBAR WOUND DEBRIDEMENT N/A 03/30/2021  ? Procedure: REPEAT LUMBAR WOUND DEBRIDEMENT;  Surgeon: [name], MD;  Location: [place];  Service: Orthopedics;  Laterality: N/A;  ? right rotator cuff    ? ROTATOR CUFF REPAIR Left   ? TOTAL KNEE ARTHROPLASTY Left 09/08/2020  ? Procedure: LEFT TOTAL KNEE ARTHROPLASTY;  Surgeon: Leandrew Koyanagi, MD;  Location: [place];  Service: Orthopedics;  Laterality: Left;  ? TUBAL LIGATION    ? ?Patient Active Problem List  ? Diagnosis Date Noted  ? Acute on chronic renal insufficiency 03/27/2021  ? Postoperative complication of skin involving drainage from  surgical wound 03/25/2021  ? Post op infection 03/24/2021  ? S/P lumbar fusion 03/24/2021  ? Lumbar stenosis 03/09/2021  ? Degenerative spondylolisthesis 02/19/2021  ? Primary osteoarthritis of left knee 09/08/2020  ? DJD (degenerative joint disease) of knee 09/08/2020  ? Status post total left knee replacement 09/08/2020  ? Spondylosis without myelopathy or radiculopathy, lumbar region 07/17/2020  ? Chronic pain syndrome 07/17/2020  ? Myalgia 07/17/2020  ? OSA (obstructive sleep apnea) 05/19/2020  ? 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/17/2020  ? Sleep disturbance 04/17/2020  ? Chronic pain of both shoulders 03/03/2020  ? Pain in both hands 03/03/2020  ? Primary osteoarthritis of both knees 03/03/2020  ? Pain in both feet 03/03/2020  ? History of chronic kidney disease 03/03/2020  ? DDD (degenerative disc disease), lumbar 02/05/2020  ? Vitamin D deficiency 01/09/2020  ? Hyperlipidemia 01/09/2020  ? CKD (chronic kidney disease) stage 3, GFR 30-59 ml/min (HCC) 01/09/2020  ? HTN (hypertension)   ? Morbid obesity ([place])   ? Gout   ? Fibromyalgia   ? Depression   ? Insomnia   ? ? ?PCP: Isaac Bliss, [name], MD ? ?REFERRING PROVIDER: [name], MD  ? ?REFERRING DIAG: Z98.1 (ICD-10-CM) - S/P lumbar fusion L76.82 (ICD-10-CM) - Postoperative  complication of skin involving drainage from surgical wound  ? ?THERAPY DIAG:  ?Other low back pain - Plan: PT plan of care cert/re-cert ? ?Muscle weakness (generalized) - Plan: PT plan of care cert/re-cert ? ?Abnormal posture - Plan: PT plan of care cert/re-cert ? ?Other symptoms and signs involving the musculoskeletal system - Plan: PT plan of care cert/re-cert ? ?ONSET DATE: 03/09/21, then wound vac placement on 03/25/21 and 03/30/21 ? ?SUBJECTIVE:                                                                                                                                                                                          ? ?SUBJECTIVE STATEMENT: ?Pt is a  71 y/o female who presents to OPPT s/p L4-5 lumbar fusion on 03/09/21.  She then had difficulty getting incision to heal so wound vac placed due to wound dehiscence on 03/25/21, then again on 03/30/21 due to infection.  She reports she is slowly healing, and feels like she has a "knife in my back all the time."  She's limited by all activity at this time.  Wound vac removed 06/19/21 and oral antibiotics for a year. ?PERTINENT HISTORY:  ?Fibromyalgia, Lt TKA, Rt knee OA, depression, gout, Lt RTC repair ? ?PAIN:  ?Are you having pain? Yes: NPRS scale: 8, up to 15, at best 5/10 ?Pain location: low back ?Pain description: stabbing, throbbing, burning ?Aggravating factors: constant, position changes, any movement ?Relieving factors: rest, medication ? ? ?PRECAUTIONS: Back and Other: on oral antibiotics for 1 year ? ?WEIGHT BEARING RESTRICTIONS No ? ?FALLS:  ?Has patient fallen in last 6 months? No ? ?LIVING ENVIRONMENT: ?Lives with: lives with their family (mom, sister, and husband) ?Lives in: House/apartment ?Stairs: Yes: External: 6-7 steps; bilateral but cannot reach both ?Has following equipment at home: Single point cane, Walker - 2 wheeled, and bed side commode ? ?OCCUPATION: retired ? ?PLOF: Independent and Leisure: play with cat, watch T[employer] ? ?PATIENT GOALS: increase strength and endurance ? ? ?OBJECTIVE:  ? ?PATIENT SURVEYS:  ?07/29/21: FOTO will need to capture next visit ? ?SCREENING FOR RED FLAGS: ?Bowel or bladder incontinence: No ?Spinal tumors: No ?Cauda equina syndrome: No ?Compression fracture: No ?Abdominal aneurysm: No ? ?COGNITION: ? Overall cognitive status: Within functional limits for tasks assessed   ?  ?SENSATION: ?07/29/21: numbness and tingling in Rt foot post-op ? ?POSTURE:  ?Rounded shoulders ? ?SKIN INTEGRITY: Healing wound noted on lumbar spine, no obvious signs of infection at this time ? ?LUMBAR ROM: deferred today ? ?LE MMT: ? ?MMT Right ?07/29/2021 Left ?07/29/2021  ?Hip flexion 3/5 3/5  ?Hip  extension    ?  Hip abduction 4/5 4/5  ?Hip adduction 4/5 4/5  ?Hip internal rotation    ?Hip external rotation    ?Knee flexion 3/5 5/5  ?Knee extension 3/5 5/5  ?Ankle dorsiflexion 4/5 5/5  ? (Blank rows = not tested) ? ?FUNCTIONAL TESTS:  ?5 times sit to stand: 28.1 sec without UE strength ?3 minute walk test: 266' with SPC, RPE1-2/10 ? ?GAIT: ?Distance walked: see above ?Assistive device utilized: Single point cane ?Level of assistance: Modified independence ?Comments: decreased RLE clearance noted ? ? ? ?TODAY'S TREATMENT  ?07/29/21: Discussed walking program and how to work on endurance at home, reviewed HEP and demonstrated exercises with pt - pt verbalized understanding. ? ? ?PATIENT EDUCATION:  ?Education details: HEP  ?Person educated: Patient ?Education method: Explanation, Demonstration, and Handouts ?Education comprehension: verbalized understanding ? ? ?HOME EXERCISE PROGRAM: ?Access Code: [email or web address] ?URL: [email or web address] ?Date: 07/29/2021 ?Prepared by: Faustino Congress ? ?Exercises ?- Sit to Stand  - 1 x daily - 7 x weekly - 1 sets - 10 reps ?- Standing Hip Abduction with Counter Support  - 1 x daily - 7 x weekly - 1 sets - 20 reps ?- Standing Hip Extension with Counter Support  - 1 x daily - 7 x weekly - 1 sets - 20 reps ?- Standing March with Counter Support  - 1 x daily - 7 x weekly - 1 sets - 20 reps ?- Heel Raises with Counter Support  - 1 x daily - 7 x weekly - 1 sets - 20 reps ? ?Patient Education ?- walking program  ? ?ASSESSMENT: ? ?CLINICAL IMPRESSION: ?Patient is a 71 y.o. female who was seen today for physical therapy evaluation and treatment for LBP s/p lumbar fusion complicated by post-op infection.  She demonstrates decreased strength and endurance as well as decreased mobility and will benefit from PT to address deficits listed.  ? ? ?OBJECTIVE IMPAIRMENTS Abnormal gait, decreased activity tolerance, decreased balance, decreased endurance, decreased knowledge of use of  DME, decreased mobility, difficulty walking, decreased strength, impaired flexibility, postural dysfunction, and pain.  ? ?ACTIVITY LIMITATIONS cleaning, community activity, meal prep, yard work, and shop

## 2021-07-30 ENCOUNTER — Ambulatory Visit: Payer: Commercial Managed Care - PPO | Admitting: Internal Medicine

## 2021-07-30 ENCOUNTER — Other Ambulatory Visit: Payer: Self-pay

## 2021-07-30 VITALS — BP 129/84 | HR 73 | Wt 202.0 lb

## 2021-07-30 DIAGNOSIS — R197 Diarrhea, unspecified: Secondary | ICD-10-CM | POA: Diagnosis not present

## 2021-07-30 MED ORDER — PROBIOTIC 250 MG PO CAPS: 1.0000 | ORAL_CAPSULE | Freq: Two times a day (BID) | ORAL | 5 refills | Status: DC

## 2021-07-30 NOTE — Progress Notes (Signed)
? ?   ? ? ? ? ?Patient Active Problem List  ? Diagnosis Date Noted  ? Acute on chronic renal insufficiency 03/27/2021  ? Postoperative complication of skin involving drainage from surgical wound 03/25/2021  ? Post op infection 03/24/2021  ? S/P lumbar fusion 03/24/2021  ? Lumbar stenosis 03/09/2021  ? Degenerative spondylolisthesis 02/19/2021  ? Primary osteoarthritis of left knee 09/08/2020  ? DJD (degenerative joint disease) of knee 09/08/2020  ? Status post total left knee replacement 09/08/2020  ? Spondylosis without myelopathy or radiculopathy, lumbar region 07/17/2020  ? Chronic pain syndrome 07/17/2020  ? Myalgia 07/17/2020  ? OSA (obstructive sleep apnea) 05/19/2020  ? 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/17/2020  ? Sleep disturbance 04/17/2020  ? Chronic pain of both shoulders 03/03/2020  ? Pain in both hands 03/03/2020  ? Primary osteoarthritis of both knees 03/03/2020  ? Pain in both feet 03/03/2020  ? History of chronic kidney disease 03/03/2020  ? DDD (degenerative disc disease), lumbar 02/05/2020  ? Vitamin D deficiency 01/09/2020  ? Hyperlipidemia 01/09/2020  ? CKD (chronic kidney disease) stage 3, GFR 30-59 ml/min (HCC) 01/09/2020  ? HTN (hypertension)   ? Morbid obesity ([place])   ? Gout   ? Fibromyalgia   ? Depression   ? Insomnia   ? ? ?Patient's Medications  ?New Prescriptions  ? No medications on file  ?Previous Medications  ? ALLOPURINOL (ZYLOPRIM) 100 MG TABLET    TAKE 2 TABLETS BY MOUTH EVERY DAY  ? AMLODIPINE (NORVASC) 5 MG TABLET    TAKE 1 TABLET (5 MG TOTAL) BY MOUTH DAILY.  ? ATORVASTATIN (LIPITOR) 10 MG TABLET    TAKE 1 TABLET BY MOUTH EVERYDAY AT BEDTIME  ? DIAZEPAM (VALIUM) 5 MG TABLET    Take 1 by mouth 1 hour  pre-procedure with very light food. May bring 2nd tablet to appointment.  ? DULOXETINE (CYMBALTA) 30 MG CAPSULE    TAKE 1 CAPSULE BY MOUTH EVERY DAY  ? ESZOPICLONE (LUNESTA) 2 MG TABS TABLET    TAKE 1 TABLET BY MOUTH AT NIGHT IMMEDIATELY BEFORE BEDTIME  ?  FLUTICASONE (FLONASE) 50 MCG/ACT NASAL SPRAY    Place 1 spray into both nostrils daily as needed for allergies.  ? HYDROCODONE-ACETAMINOPHEN (NORCO) 7.5-325 MG TABLET    Take 1-2 tablets by mouth every 12 (twelve) hours as needed for moderate pain.  ? HYDROCODONE-ACETAMINOPHEN (NORCO) 7.5-325 MG TABLET    Take 1 tablet by mouth 2 (two) times daily as needed for moderate pain.  ? HYDROCODONE-ACETAMINOPHEN (NORCO) 7.5-325 MG TABLET    Take 1 tablet by mouth every 4 (four) hours as needed for moderate pain.  ? HYDROXYCHLOROQUINE (PLAQUENIL) 200 MG TABLET    Take 1 tablet (200 mg total) by mouth daily.  ? HYDROXYZINE (VISTARIL) 100 MG CAPSULE    Take 1 capsule (100 mg total) by mouth 3 (three) times daily as needed for itching.  ? LEVOFLOXACIN (LEVAQUIN) 250 MG TABLET    Take 250 mg by mouth daily.  ? LORATADINE (CLARITIN) 10 MG TABLET    Take 10 mg by mouth daily as needed for allergies.  ? LOSARTAN (COZAAR) 50 MG TABLET    Take 1 tablet (50 mg total) by mouth daily.  ? METOCLOPRAMIDE (REGLAN) 5 MG TABLET    Take 1-2 tablets (5-10 mg total) by mouth every 8 (eight) hours as needed for nausea (if ondansetron (ZOFRAN) ineffective.).  ? METRONIDAZOLE (FLAGYL) 500 MG TABLET    Take 1 tablet (500 mg total) by  mouth 2 (two) times daily.  ? ONDANSETRON (ZOFRAN) 4 MG TABLET    Take 1 tablet (4 mg total) by mouth every 8 (eight) hours as needed for nausea or vomiting.  ? ONDANSETRON (ZOFRAN) 4 MG TABLET    Take 1 tablet (4 mg total) by mouth every 8 (eight) hours as needed for nausea or vomiting.  ? OXYCODONE-ACETAMINOPHEN (PERCOCET) 5-325 MG TABLET    Take 1 tablet by mouth every 4 (four) hours as needed for severe pain.  ? PREDNISONE (DELTASONE) 10 MG TABLET    Take 10 mg by mouth as needed.  ? PREGABALIN (LYRICA) 75 MG CAPSULE    Take 1 capsule (75 mg total) by mouth 3 (three) times daily.  ? TIZANIDINE (ZANAFLEX) 4 MG TABLET    Take 1 tablet (4 mg total) by mouth every 8 (eight) hours as needed for muscle spasms.  ?Modified  Medications  ? No medications on file  ?Discontinued Medications  ? No medications on file  ? ? ?Subjective: ? ?71 year old female with obesity, CKD, L4-L5 stenosis anterolisthesis status post L4-5 transforaminal lumbar interbody fusion with pedicle instrumentation cage lateral fusion right L5-S1 microdiscectomy on 03/09/2021.  Patient developed drainage from surgical site infection hospitalized underwent I&D(03/25/21) of lumbar incision.  OR cultures positive for Enterobacter aeruginosa, Morganella and Bacteroides fragilis.  She underwent repeat debridement in the OR due to purulent drainage on 03/30/21. Patient was initially on cefazolin transitioned to cefpime then metronidazole added.  Plan was to complete 2 weeks of IV therapy with cefepime and metronidazole (04/14/21) then transition to levaquin and metro to complete 8 weeks of antibiotics from OR on 03/30/21. She does have retained hardware as suuch will need suppressive antibiotics with metro and levaquin.  ?Interval 1/24: Changed to levaquin and metronidazole ?05/19/21:Wound vac in place. Followed by Dr. Ames Coupe). Pt reports tolerating levaquin and metronidazole.  ?06/11/21: no new complaints ?07/30/21: ?Presents today due to GI symptoms. Pt has loss of appetitie, loose stools 5-6 times/day. Loose stools started  in the beginning of April. Pt has been taking percocet PRN less often since April. She notices stools are more formed with percocet. Denies, fever, chills, N,V. Pt had been on probiotics, but has not been taking them since mid April.  ?She is on levaquin 25 mg PO qd for chronic suppression. Seen by Dr. Lorin Mercy on 4/26 and wound noted to be completely healed. Wound vac off.  ?Review of Systems: ?Review of Systems  ?All other systems reviewed and are negative. ? ?Past Medical History:  ?Diagnosis Date  ? Chronic kidney disease   ? Depression   ? Fibromyalgia   ? Gout   ? HTN (hypertension)   ? Insomnia   ? Morbid obesity ([place])   ? Osteoarthritis   ?  Palpitation   ? Sleep apnea   ? ? ?Social History  ? ?Tobacco Use  ? Smoking status: Never  ? Smokeless tobacco: Never  ?Vaping Use  ? Vaping Use: Never used  ?Substance Use Topics  ? Alcohol use: Never  ?  Alcohol/week: 0.0 standard drinks  ? Drug use: Never  ? ? ?Family History  ?Problem Relation Age of Onset  ? Hypothyroidism Mother   ? Hyperlipidemia Other   ? Kidney failure Father   ? Dementia Father   ? Gout Father   ? Arthritis Father   ? Prostate cancer Father   ? ? ?Allergies  ?Allergen Reactions  ? Cefuroxime Axetil Rash and Hives  ? Clarithromycin Rash and Hives  ?  Other reaction(s): Unknown  ? Nsaids Nausea And Vomiting  ?  Other reaction(s): Unknown  ? Cefuroxime   ?  Other reaction(s): Unknown  ? Oxycodone Other (See Comments)  ?  hallucinations  ? Oxycodone-Acetaminophen Other (See Comments)  ?  Hallucination ?Other reaction(s): Unknown  ? Robaxin [Methocarbamol] Nausea And Vomiting  ?  "It tears my stomach up."  ? Toradol [Ketorolac Tromethamine] Nausea And Vomiting  ? Tramadol Nausea And Vomiting and Other (See Comments)  ?  Upset stomach ?Other reaction(s): Unknown  ? ? ?Health Maintenance  ?Topic Date Due  ? Hepatitis C Screening  Never done  ? Zoster Vaccines- Shingrix (1 of 2) Never done  ? COVID-19 Vaccine (4 - Booster for Moderna series) 03/19/2020  ? INFLUENZA VACCINE  10/20/2021  ? MAMMOGRAM  01/01/2023  ? COLONOSCOPY (Pts 45-50yr Insurance coverage will need to be confirmed)  01/04/2024  ? TETANUS/TDAP  01/07/2030  ? Pneumonia Vaccine 69 Years old  Completed  ? DEXA SCAN  Completed  ? HPV VACCINES  Aged Out  ? ? ?Objective: ? ?Vitals:  ? 07/30/21 1101  ?BP: 129/84  ?Pulse: 73  ?SpO2: 97%  ?Weight: 202 lb (91.6 kg)  ? ?Body mass index is 36.95 kg/m?. ? ?Physical Exam ?Constitutional:   ?   Appearance: Normal appearance.  ?HENT:  ?   Head: Normocephalic and atraumatic.  ?   Right Ear: Tympanic membrane normal.  ?   Left Ear: Tympanic membrane normal.  ?   Nose: Nose normal.  ?    Mouth/Throat:  ?   Mouth: Mucous membranes are moist.  ?Eyes:  ?   Extraocular Movements: Extraocular movements intact.  ?   Conjunctiva/sclera: Conjunctivae normal.  ?   Pupils: Pupils are equal, round, and reactive to l

## 2021-07-31 ENCOUNTER — Other Ambulatory Visit: Payer: Self-pay | Admitting: Internal Medicine

## 2021-07-31 ENCOUNTER — Encounter: Payer: Self-pay | Admitting: Internal Medicine

## 2021-07-31 LAB — CBC WITH DIFFERENTIAL/PLATELET
Absolute Monocytes: 519 cells/uL (ref 200–950)
Basophils Absolute: 21 cells/uL (ref 0–200)
Basophils Relative: 0.4 %
Eosinophils Absolute: 223 cells/uL (ref 15–500)
Eosinophils Relative: 4.2 %
HCT: 36.7 % (ref 35.0–45.0)
Hemoglobin: 12 g/dL (ref 11.7–15.5)
Lymphs Abs: 1866 cells/uL (ref 850–3900)
MCH: 28.4 pg (ref 27.0–33.0)
MCHC: 32.7 g/dL (ref 32.0–36.0)
MCV: 87 fL (ref 80.0–100.0)
MPV: 9.9 fL (ref 7.5–12.5)
Monocytes Relative: 9.8 %
Neutro Abs: 2671 cells/uL (ref 1500–7800)
Neutrophils Relative %: 50.4 %
Platelets: 332 10*3/uL (ref 140–400)
RBC: 4.22 10*6/uL (ref 3.80–5.10)
RDW: 14.8 % (ref 11.0–15.0)
Total Lymphocyte: 35.2 %
WBC: 5.3 10*3/uL (ref 3.8–10.8)

## 2021-07-31 LAB — COMPLETE METABOLIC PANEL WITH GFR
AG Ratio: 1.3 (calc) (ref 1.0–2.5)
ALT: 7 U/L (ref 6–29)
AST: 13 U/L (ref 10–35)
Albumin: 3.9 g/dL (ref 3.6–5.1)
Alkaline phosphatase (APISO): 80 U/L (ref 37–153)
BUN/Creatinine Ratio: 16 (calc) (ref 6–22)
BUN: 17 mg/dL (ref 7–25)
CO2: 24 mmol/L (ref 20–32)
Calcium: 9.5 mg/dL (ref 8.6–10.4)
Chloride: 105 mmol/L (ref 98–110)
Creat: 1.08 mg/dL — ABNORMAL HIGH (ref 0.50–1.05)
Globulin: 3.1 g/dL (calc) (ref 1.9–3.7)
Glucose, Bld: 92 mg/dL (ref 65–99)
Potassium: 4.2 mmol/L (ref 3.5–5.3)
Sodium: 140 mmol/L (ref 135–146)
Total Bilirubin: 0.4 mg/dL (ref 0.2–1.2)
Total Protein: 7 g/dL (ref 6.1–8.1)
eGFR: 56 mL/min/{1.73_m2} — ABNORMAL LOW (ref >=60)

## 2021-07-31 MED ORDER — PROBIOTIC 250 MG PO CAPS: 1.0000 | ORAL_CAPSULE | Freq: Two times a day (BID) | ORAL | 5 refills | Status: DC

## 2021-08-03 ENCOUNTER — Other Ambulatory Visit: Payer: Commercial Managed Care - PPO

## 2021-08-03 ENCOUNTER — Other Ambulatory Visit: Payer: Self-pay

## 2021-08-03 DIAGNOSIS — R197 Diarrhea, unspecified: Secondary | ICD-10-CM

## 2021-08-03 NOTE — Progress Notes (Unsigned)
Stool sample dropped off, red sterile container only, for C. Diff ?

## 2021-08-04 LAB — C. DIFFICILE GDH AND TOXIN A/B
GDH ANTIGEN: NOT DETECTED
MICRO NUMBER:: 13398192
SPECIMEN QUALITY:: ADEQUATE
TOXIN A AND B: NOT DETECTED

## 2021-08-05 ENCOUNTER — Encounter: Payer: Self-pay | Admitting: Rehabilitative and Restorative Service Providers"

## 2021-08-05 ENCOUNTER — Ambulatory Visit (INDEPENDENT_AMBULATORY_CARE_PROVIDER_SITE_OTHER): Payer: Commercial Managed Care - PPO | Admitting: Rehabilitative and Restorative Service Providers"

## 2021-08-05 DIAGNOSIS — R293 Abnormal posture: Secondary | ICD-10-CM | POA: Diagnosis not present

## 2021-08-05 DIAGNOSIS — M6281 Muscle weakness (generalized): Secondary | ICD-10-CM

## 2021-08-05 DIAGNOSIS — M5459 Other low back pain: Secondary | ICD-10-CM

## 2021-08-05 NOTE — Progress Notes (Signed)
Subjective:    Patient ID: Amy Hooper, female    DOB: 05-02-1950, 71 y.o.   MRN: [id number]  HPI   Female with past medical history/past surgical history of OSA, morbid obesity, hypertension, fibromyalgia, depression, gout, CKD, degenerative disc disease-lumbar, bilateral knee surgeries for torn mensici, bilateral shoulder surgeries for rotator cuff presents with bilateral L > R knee pain and wound-related pain  1) Wound related pain -she is having severe pain associated with her wound vac dressing changes -she has been taking 2 hydrocodone per day and this allows her to function but she states her surgeon is no longer willing to prescribe for her -she asks about alternative pain medications she can take -she says her surgeon says her wound vac may be able to be removed at the end of March.  She cannot sit too long because this aggravates the pain.   2) Knee pain: Initially stated: Started ~1990. After a fall on her knees.  Progressively getting worse.  Steroid injections improve the pain along with brace.  Ambulation exacerbates the pain.  Moving after prolonged postures exacerbates the pain.  Achy.  Radiates down leg.  Intermittent. Denies associated weakness, numbness.  Tylenol. 1 fall summer of 2021 falling backward in chair.  Pain limits ambulation.  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 She has had bilateral L4-5 lumbar epidural steroid injections.  She has also had facet injections.  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  Irritation of S1 nerve roots.  She had a left knee steroid injection on 03/18/2020 with Ortho.  She is seen rheumatology as well.  No reviewed from rheumatology, orthopedic surgeon, physiatrist, neurology-plan for knee replacement in December 2022. She does note that she is improving with weight loss and dietary changes.    3) Hand pain -spiked since she has been off Celebrex due to her kidney injury -she was recommended to take Qunidine but she can't take because of the antibiotics which she is on for a year due to her wound in her back.  -percocet does not help with the pain as much as the hydrocodone.   4) AKI -improved since off Celebrex  5) Depression:  -she feels miserable.  -she was started on Cymbalta. This did help with the pain in her legs.   6) Insomnia: -not sleeping well.     She saw pain management since that time and is scheduled for MBB.  Since last visit, she states she has had great improvement with pool therapy. She states she is hungry. She had good benefits with Lyrica. She has not seen Nephro yet. She is awaiting CPAP.  She has bad fibromyalgia flares when the weather changes- especially when it rains. She was started on Neurontin by Dr. Posey Pronto and it does help on a daily basis. She had relief with Celebrex well before but had to stop due to CKD. She has never tried steroids.   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   Situational depression: She is going through changes with her family. They are all living in her house and everyone is arguing. She does not want to get herself worked up due to her hypertension. Her family brings her in a lot. Her husband says why don't you say anything. She felt a car coming across her yesterday and she screamed. She feels overwhelmed. Her mother  may have undiagnosed mental illness. Her mom called th police on her husband.   Severe arthritis and bulging discs: -she has a lot of pain and nothing helps it. If she sits too long it hurts and if she walks to long it hurts -Lyrica and steroids help her pain -she is planing to have surgery.   Weight gain  Not voiding as much as she used to -check Creatinine today.    Pain Inventory Average Pain 10 Pain Right Now 6 My pain is intermittent, burning, stabbing, and aching, stabbing  In the last 24 hours, has  pain interfered with the following? General activity 9 Relation with others 9 Enjoyment of life 10 What TIME of day is your pain at its worst? morning  and night Sleep (in general) Poor  Pain is worse with: walking, bending, sitting, inactivity, standing, and some activites Pain improves with: rest and medication Relief from Meds: 2   Family History  Problem Relation Age of Onset   Hypothyroidism Mother    Hyperlipidemia Other    Kidney failure Father    Dementia Father    Gout Father    Arthritis Father    Prostate cancer Father    Social History   Socioeconomic History   Marital status: Married    Spouse name: Not on file   Number of children: 3   Years of education: Not on file   Highest education level: Bachelor's degree (e.g., BA, AB, BS)  Occupational History   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     Social Determinants of Health   Financial Resource Strain: Low Risk    Difficulty of Paying Living Expenses: Not hard at all  Food Insecurity: No Food Insecurity   Worried About Charity fundraiser in the Last Year: Never true   [place] in the Last Year: Never true  Transportation Needs: No Transportation Needs   Lack of Transportation (Medical): No   Lack of Transportation (Non-Medical): No  Physical Activity: Unknown   Days of Exercise per Week: 0 days   Minutes of Exercise per Session: Not on file  Stress: No Stress Concern Present   Feeling of Stress : Only a little  Social Connections: Moderately Isolated   Frequency of Communication with Friends and Family: More than three times a week   Frequency of Social Gatherings with Friends and  Family: Once a week   Attends Religious Services: Never   Marine scientist or Organizations: No   Attends Music therapist: Not on file   Marital Status: Married   Past Surgical History:  Procedure Laterality Date   APPLICATION OF WOUND VAC N/A 03/25/2021   Procedure: APPLICATION OF WOUND VAC;  Surgeon: [name], MD;  Location: [place];  Service: Orthopedics;  Laterality: N/A;   APPLICATION OF WOUND VAC N/A 03/30/2021   Procedure: WOUND VAC CHANGE 12x6x5;  Surgeon: [name], MD;  Location: [place];  Service: Orthopedics;  Laterality: N/A;   INCISION AND DRAINAGE OF WOUND N/A 03/25/2021   Procedure: LUMBAR POST OP INCISION IRRIGATION;  Surgeon: Rodell Perna  C, MD;  Location: [place];  Service: Orthopedics;  Laterality: N/A;   JOINT REPLACEMENT     KNEE ARTHROSCOPY     LUMBAR WOUND DEBRIDEMENT N/A 03/30/2021   Procedure: REPEAT LUMBAR WOUND DEBRIDEMENT;  Surgeon: [name], MD;  Location: [place];  Service: Orthopedics;  Laterality: N/A;   right rotator cuff     ROTATOR CUFF REPAIR Left    TOTAL KNEE ARTHROPLASTY Left 09/08/2020   Procedure: LEFT TOTAL KNEE ARTHROPLASTY;  Surgeon: Leandrew Koyanagi, MD;  Location: [place];  Service: Orthopedics;  Laterality: Left;   TUBAL LIGATION     Past Medical History:  Diagnosis Date   Chronic kidney disease    Depression    Fibromyalgia    Gout    HTN (hypertension)    Insomnia    Morbid obesity (HCC)    Osteoarthritis    Palpitation    Sleep apnea    BP 109/71   Pulse 76   Ht '5\' 2"'$  (1.575 m)   Wt 201 lb 6.4 oz (91.4 kg)   SpO2 97%   BMI 36.84 kg/m   Opioid Risk Score:   Fall Risk Score:  `1  Depression screen [facility] 2/9     06/11/2021   11:18 AM 05/19/2021   10:55 AM 04/14/2021   10:24 AM 03/05/2021    7:52 AM 02/06/2021   10:40 AM 11/07/2020   12:40 PM 04/22/2020    7:35 AM  Depression screen PHQ 2/9  Decreased Interest 0 1 0 0 0 0 0  Down, Depressed, Hopeless 0 3 0 0 0 0 0  PHQ - 2 Score 0 4 0 0 0 0 0  Altered  sleeping  3  0   1  Tired, decreased energy  2  0   0  Change in appetite  2  0   0  Feeling bad or failure about yourself   0  0   0  Trouble concentrating  0  0   0  Moving slowly or fidgety/restless  0  0   0  Suicidal thoughts  0  0   0  PHQ-9 Score  11  0   1  Difficult doing work/chores    Not difficult at all   Not difficult at all   Review of Systems  Constitutional: Negative.   HENT: Negative.    Eyes: Negative.   Respiratory: Negative.    Cardiovascular: Negative.   Gastrointestinal: Negative.   Endocrine: Negative.   Genitourinary: Negative.   Musculoskeletal:  Positive for arthralgias, back pain and gait problem. Negative for joint swelling, myalgias, neck pain and neck stiffness.       Pain in both legs  Allergic/Immunologic: Negative.   Hematological: Negative.   All other systems reviewed and are negative.    Objective:   Physical Exam  Gen: no distress, normal appearing HEENT: oral mucosa pink and moist, NCAT Cardio: Reg rate Chest: normal effort, normal rate of breathing Abd: soft, non-distended Ext: no edema Psych: pleasant, normal affect Skin: intact Neuro: Alert and oriented x3    Assessment & Plan:  Female with past medical history/past surgical history of OSA, morbid obesity, hypertension, fibromyalgia, depression, gout, CKD, degenerative disc disease-lumbar, bilateral knee surgeries for torn mensici, bilateral shoulder surgeries for rotator cuff presents with bilateral L > R knee pain.   1. Bilateral knee OA - endstage  Patient was supposed to have knee replacements last year, but due to insurance changed to summer 2022  Endstage OA in b/l knee per Ortho, no films available  No benefit with Heat/Cold, PT (~2019), Robaxin, Flexaril, Tizanidine  Continue bracing prn (limit on right knee - educated)  Continue Voltaren gel  Lidocaine 5% patch ordered  Decreased Cymbalta to '20mg'$  daily.   Patient states main goal is to ambulate  Wants to buy a pool -  afraid of public locations due to Co[employer]  Recommended follow up with Nephro regarding Chondroitin sulfate, appointment next month  Tolerating mild exercise - limited by back   Continue antiinflammatory diet -Discussed following foods that may reduce pain: 1) Ginger (especially studied for arthritis)- reduce leukotriene production to decrease inflammation 2) Blueberries- high in phytonutrients that decrease inflammation 3) Salmon- marine omega-3s reduce joint swelling and pain 4) Pumpkin seeds- reduce inflammation 5) dark chocolate- reduces inflammation 6) turmeric- reduces inflammation 7) tart cherries - reduce pain and stiffness 8) extra virgin olive oil - its compound olecanthal helps to block prostaglandins  9) chili peppers- can be eaten or applied topically via capsaicin 10) mint- helpful for headache, muscle aches, joint pain, and itching 11) garlic- reduces inflammation  Link to further information on diet for chronic pain: [email or web address]   2. Sleep disturbance  Continue Lunesta per PCP  Continue to await CPAP -Try to go outside near sunrise -Get exercise during the day.  -Turn off all devices an hour before bedtime.  -Teas that can benefit: chamomile, valerian root, Brahmi (Bacopa) -Can consider over the counter melatonin or magnesium glycinate (latter can also help with constipation) -Pistachios naturally increase the production of melatonin  -start amitriptyline '10mg'$  HS    3. Morbid Obesity  Continue follow up with dietitian  Encouraged weight loss  4. Myalgia   Will consider trigger point injections  5. Fibromyalgia  Continue  aquatic therapy - great benefit  Encouraged ROM, stretching  Continue Cymbalta  Decrease Lyrica to '50mg'$  TID  6) Wound related pain -discussed plan to wean off Percocet -Discussed that the hydrocodone was helping with wound related pain which is still horrible.  Will get UDS and pain contact today.  -discussed maximizing tylenol which she is already doing  -avoid NSAIDs due to only one kidney present -discussed wean to one, then no opioids after wound vac removal  7) depression -start amitriptyline '10mg'$  HS -discussed kombucha, yogurt, and kefir.  -Discussed her desire to return to work.

## 2021-08-05 NOTE — Therapy (Signed)
?OUTPATIENT PHYSICAL THERAPY TREATMENT NOTE ? ? ?Patient Name: Amy Hooper ?MRN: [id number] ?DOB:Jun 20, 1950, 71 y.o., female ?Today's Date: 08/05/2021 ? ?PCP: Gretchen Portela, MD ?REFERRING PROVIDER: [name], MD ? ?END OF SESSION:  ? PT End of Session - 08/05/21 1440   ? ? Visit Number 2   ? Number of Visits 6   ? Date for PT Re-Evaluation 09/09/21   ? PT Start Time 0845   ? PT Stop Time 0929   ? PT Time Calculation (min) 44 min   ? Activity Tolerance Patient tolerated treatment well;No increased pain   ? Behavior During Therapy [facility] for tasks assessed/performed   ? ?  ?  ? ?  ? ? ?Past Medical History:  ?Diagnosis Date  ? Chronic kidney disease   ? Depression   ? Fibromyalgia   ? Gout   ? HTN (hypertension)   ? Insomnia   ? Morbid obesity ([place])   ? Osteoarthritis   ? Palpitation   ? Sleep apnea   ? ?Past Surgical History:  ?Procedure Laterality Date  ? APPLICATION OF WOUND VAC N/A 03/25/2021  ? Procedure: APPLICATION OF WOUND VAC;  Surgeon: [name], MD;  Location: [place];  Service: Orthopedics;  Laterality: N/A;  ? APPLICATION OF WOUND VAC N/A 03/30/2021  ? Procedure: WOUND VAC CHANGE 12x6x5;  Surgeon: [name], MD;  Location: [place];  Service: Orthopedics;  Laterality: N/A;  ? INCISION AND DRAINAGE OF WOUND N/A 03/25/2021  ? Procedure: LUMBAR POST OP INCISION IRRIGATION;  Surgeon: [name], MD;  Location: [place];  Service: Orthopedics;  Laterality: N/A;  ? JOINT REPLACEMENT    ? KNEE ARTHROSCOPY    ? LUMBAR WOUND DEBRIDEMENT N/A 03/30/2021  ? Procedure: REPEAT LUMBAR WOUND DEBRIDEMENT;  Surgeon: [name], MD;  Location: [place];  Service: Orthopedics;  Laterality: N/A;  ? right rotator cuff    ? ROTATOR CUFF REPAIR Left   ? TOTAL KNEE ARTHROPLASTY Left 09/08/2020  ? Procedure: LEFT TOTAL KNEE ARTHROPLASTY;  Surgeon: Leandrew Koyanagi, MD;  Location: [place];  Service: Orthopedics;  Laterality: Left;  ? TUBAL LIGATION    ? ?Patient Active Problem List  ? Diagnosis Date Noted  ? Acute on  chronic renal insufficiency 03/27/2021  ? Postoperative complication of skin involving drainage from surgical wound 03/25/2021  ? Post op infection 03/24/2021  ? S/P lumbar fusion 03/24/2021  ? Lumbar stenosis 03/09/2021  ? Degenerative spondylolisthesis 02/19/2021  ? Primary osteoarthritis of left knee 09/08/2020  ? DJD (degenerative joint disease) of knee 09/08/2020  ? Status post total left knee replacement 09/08/2020  ? Spondylosis without myelopathy or radiculopathy, lumbar region 07/17/2020  ? Chronic pain syndrome 07/17/2020  ? Myalgia 07/17/2020  ? OSA (obstructive sleep apnea) 05/19/2020  ? 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/17/2020  ? Sleep disturbance 04/17/2020  ? Chronic pain of both shoulders 03/03/2020  ? Pain in both hands 03/03/2020  ? Primary osteoarthritis of both knees 03/03/2020  ? Pain in both feet 03/03/2020  ? History of chronic kidney disease 03/03/2020  ? DDD (degenerative disc disease), lumbar 02/05/2020  ? Vitamin D deficiency 01/09/2020  ? Hyperlipidemia 01/09/2020  ? CKD (chronic kidney disease) stage 3, GFR 30-59 ml/min (HCC) 01/09/2020  ? HTN (hypertension)   ? Morbid obesity ([place])   ? Gout   ? Fibromyalgia   ? Depression   ? Insomnia   ? ? ?REFERRING DIAG: Z98.1 (ICD-10-CM) - S/P lumbar fusion  L76.82 (ICD-10-CM) - Postoperative complication of skin involving drainage from surgical wound  ? ?THERAPY DIAG:  ?Other low back pain ? ?Muscle weakness (generalized) ? ?Abnormal posture ? ?PERTINENT HISTORY: Fibromyalgia, Lt TKA, Rt knee OA, depression, gout, Lt RTC repair ? ?PRECAUTIONS: Back and Other: on oral antibiotics for 1 year ? ?SUBJECTIVE: Amy Hooper reports early HEP compliance.  She is walking with a wheeled walker with a seat for up to 10 minutes a day in addition to her HEP. ? ?PAIN:  ?Are you having pain? Yes: NPRS scale: 0-5/10 ?Pain location: Low back and R hip bursitis ?Pain description: Burning ?Aggravating factors: Movement, difficulty at night ?Relieving factors:  Rest ? ? ?OBJECTIVE: (objective measures completed at initial evaluation unless otherwise dated) ? ?OBJECTIVE:  ?  ?PATIENT SURVEYS:  ?07/29/21: FOTO will need to capture next visit ?  ?SCREENING FOR RED FLAGS: ?Bowel or bladder incontinence: No ?Spinal tumors: No ?Cauda equina syndrome: No ?Compression fracture: No ?Abdominal aneurysm: No ?  ?COGNITION: ?          Overall cognitive status: Within functional limits for tasks assessed               ?           ?SENSATION: ?07/29/21: numbness and tingling in Rt foot post-op ?  ?POSTURE:  ?Rounded shoulders ?  ?SKIN INTEGRITY: Healing wound noted on lumbar spine, no obvious signs of infection at this time ?  ?LUMBAR ROM: deferred today ?  ?LE MMT: ?  ?MMT Right ?07/29/2021 Left ?07/29/2021  ?Hip flexion 3/5 3/5  ?Hip extension      ?Hip abduction 4/5 4/5  ?Hip adduction 4/5 4/5  ?Hip internal rotation      ?Hip external rotation      ?Knee flexion 3/5 5/5  ?Knee extension 3/5 5/5  ?Ankle dorsiflexion 4/5 5/5  ? (Blank rows = not tested) ?  ?FUNCTIONAL TESTS:  ?5 times sit to stand: 28.1 sec without UE strength ?3 minute walk test: 266' with SPC, RPE1-2/10 ?  ?GAIT: ?Distance walked: see above ?Assistive device utilized: Single point cane ?Level of assistance: Modified independence ?Comments: decreased RLE clearance noted ?  ?  ?  ?TODAY'S TREATMENT  ?08/05/2021: ?Therapeutic Exercises: ?Trunk extension AROM (hands low to get L5-S1) limited range 10X 3 seconds ?Shoulder blade pinches 10X 5 seconds ?Standing marching with good posture 20X ?Standing hip abduction with good posture 20X ?Standing hip extension with good posture 20X ?Heel to toe raises with transversus abdominus activation 20X ?Sit to stand high seat 5X slow eccentrics (maintain lumbar lordosis) ? ?Therapeutic Activities: ?Reviewed spine anatomy with spine model, practical kitchen mechanics (get in and out of cabinets), log roll, golfers and diagonal lifts ? ? ?07/29/21: Discussed walking program and how to work  on endurance at home, reviewed HEP and demonstrated exercises with pt - pt verbalized understanding. ?  ?  ?PATIENT EDUCATION:  ?Education details: HEP        ?Person educated: Patient ?Education method: Explanation, Demonstration, and Handouts ?Education comprehension: verbalized understanding ?  ?  ?HOME EXERCISE PROGRAM: ?Access Code: [email or web address] ?URL: [email or web address] ?Date: 08/05/2021 ?Prepared by: Vista Mink ? ?Exercises ?- Sit to Stand  - 1 x daily - 7 x weekly - 1 sets - 10 reps ?- Standing Hip Abduction with Counter Support  - 1 x daily - 7 x weekly - 1 sets - 20 reps ?- Standing Hip Extension with Counter Support  - 1 x daily - 7 x weekly - 1  sets - 20 reps ?- Standing March with Counter Support  - 1 x daily - 7 x weekly - 1 sets - 20 reps ?- Heel Raises with Counter Support  - 1 x daily - 7 x weekly - 1 sets - 20 reps ?- Standing Scapular Retraction  - 5 x daily - 7 x weekly - 1 sets - 5 reps - 5 second hold ?- Standing Lumbar Extension at [place] 5 x daily - 7 x weekly - 1 sets - 5 reps - 3 seconds hold ? ?Patient Education ?- walking program  ?Patient Education ?- walking program  ?  ?ASSESSMENT: ?  ?CLINICAL IMPRESSION: ?Amy Hooper reports early HEP and walking compliance.  She did need corrective feedback to avoid trunk flexion with her standing exercises and we spent 20 minutes on postural correction and practical body mechanics.  Her prognosis remains good with continued supervised PT.  ?  ?OBJECTIVE IMPAIRMENTS Abnormal gait, decreased activity tolerance, decreased balance, decreased endurance, decreased knowledge of use of DME, decreased mobility, difficulty walking, decreased strength, impaired flexibility, postural dysfunction, and pain.  ?  ?ACTIVITY LIMITATIONS cleaning, community activity, meal prep, yard work, and shopping.  ?  ?PERSONAL FACTORS Past/current experiences, Time since onset of injury/illness/exacerbation, and 3+ comorbidities: Fibromyalgia, Lt TKA, Rt  knee OA, depression, gout, Lt RTC repair  are also affecting patient's functional outcome.  ?  ?  ?REHAB POTENTIAL: Good ?  ?CLINICAL DECISION MAKING: Unstable/unpredictable ?  ?EVALUATION COMPLEXITY: High

## 2021-08-06 ENCOUNTER — Encounter
Payer: Commercial Managed Care - PPO | Attending: Physical Medicine and Rehabilitation | Admitting: Physical Medicine and Rehabilitation

## 2021-08-06 VITALS — BP 109/71 | HR 76 | Ht 62.0 in | Wt 201.4 lb

## 2021-08-06 DIAGNOSIS — Z5181 Encounter for therapeutic drug level monitoring: Secondary | ICD-10-CM | POA: Insufficient documentation

## 2021-08-06 DIAGNOSIS — M25562 Pain in left knee: Secondary | ICD-10-CM | POA: Insufficient documentation

## 2021-08-06 DIAGNOSIS — Z79891 Long term (current) use of opiate analgesic: Secondary | ICD-10-CM | POA: Insufficient documentation

## 2021-08-06 DIAGNOSIS — Z1321 Encounter for screening for nutritional disorder: Secondary | ICD-10-CM | POA: Insufficient documentation

## 2021-08-06 DIAGNOSIS — G894 Chronic pain syndrome: Secondary | ICD-10-CM | POA: Diagnosis present

## 2021-08-06 DIAGNOSIS — E559 Vitamin D deficiency, unspecified: Secondary | ICD-10-CM | POA: Diagnosis present

## 2021-08-06 DIAGNOSIS — F320 Major depressive disorder, single episode, mild: Secondary | ICD-10-CM | POA: Insufficient documentation

## 2021-08-06 DIAGNOSIS — M25561 Pain in right knee: Secondary | ICD-10-CM | POA: Diagnosis not present

## 2021-08-06 MED ORDER — AMITRIPTYLINE HCL 10 MG PO TABS: 10.0000 mg | ORAL_TABLET | Freq: Every day | ORAL | 1 refills | Status: DC

## 2021-08-06 MED ORDER — DULOXETINE HCL 20 MG PO CPEP: 20.0000 mg | ORAL_CAPSULE | Freq: Every day | ORAL | 3 refills | Status: DC

## 2021-08-06 MED ORDER — LIDOCAINE 5 % EX PTCH: 1.0000 | MEDICATED_PATCH | CUTANEOUS | 0 refills | Status: DC

## 2021-08-06 NOTE — Addendum Note (Signed)
Addended by: Jasmine December T on: 08/06/2021 11:59 AM   Modules accepted: Orders

## 2021-08-06 NOTE — Addendum Note (Signed)
Addended by: Izora Ribas on: 08/06/2021 12:03 PM   Modules accepted: Orders

## 2021-08-07 ENCOUNTER — Encounter: Payer: Commercial Managed Care - PPO | Admitting: Physical Medicine and Rehabilitation

## 2021-08-07 DIAGNOSIS — E559 Vitamin D deficiency, unspecified: Secondary | ICD-10-CM

## 2021-08-07 LAB — VITAMIN D 25 HYDROXY (VIT D DEFICIENCY, FRACTURES): Vit D, 25-Hydroxy: 20.3 ng/mL — ABNORMAL LOW (ref 30.0–100.0)

## 2021-08-07 MED ORDER — VITAMIN D (ERGOCALCIFEROL) 1.25 MG (50000 UNIT) PO CAPS: 50000.0000 [IU] | ORAL_CAPSULE | ORAL | 0 refills | Status: DC

## 2021-08-07 NOTE — Progress Notes (Signed)
Left message regarding low vitamin D level and prescription of weekly supplement

## 2021-08-10 ENCOUNTER — Encounter: Payer: Self-pay | Admitting: Physical Medicine and Rehabilitation

## 2021-08-11 ENCOUNTER — Other Ambulatory Visit: Payer: Self-pay | Admitting: Physical Medicine and Rehabilitation

## 2021-08-11 ENCOUNTER — Telehealth: Payer: Self-pay

## 2021-08-11 LAB — TOXASSURE SELECT,+ANTIDEPR,UR

## 2021-08-11 MED ORDER — HYDROCODONE-ACETAMINOPHEN 7.5-325 MG PO TABS: 1.0000 | ORAL_TABLET | Freq: Two times a day (BID) | ORAL | 0 refills | Status: DC | PRN

## 2021-08-11 NOTE — Telephone Encounter (Signed)
[facility] sent a fax stating Hydrocodone/APAP 7.5/325 mg is on back order. They do have 10/325 mg, 5/325 mg and 7.5/300 mg in stock. Would you like to change to one of these? If so send in a new prescription and let me know so I can call and cancel the other.

## 2021-08-12 ENCOUNTER — Telehealth: Payer: Self-pay | Admitting: *Deleted

## 2021-08-12 ENCOUNTER — Encounter: Payer: Self-pay | Admitting: Physical Therapy

## 2021-08-12 ENCOUNTER — Ambulatory Visit (INDEPENDENT_AMBULATORY_CARE_PROVIDER_SITE_OTHER): Payer: Commercial Managed Care - PPO | Admitting: Physical Therapy

## 2021-08-12 ENCOUNTER — Other Ambulatory Visit: Payer: Self-pay | Admitting: Physical Medicine and Rehabilitation

## 2021-08-12 DIAGNOSIS — M5459 Other low back pain: Secondary | ICD-10-CM | POA: Diagnosis not present

## 2021-08-12 DIAGNOSIS — R293 Abnormal posture: Secondary | ICD-10-CM

## 2021-08-12 DIAGNOSIS — R29898 Other symptoms and signs involving the musculoskeletal system: Secondary | ICD-10-CM

## 2021-08-12 DIAGNOSIS — M6281 Muscle weakness (generalized): Secondary | ICD-10-CM

## 2021-08-12 MED ORDER — HYDROCODONE-ACETAMINOPHEN 10-300 MG PO TABS: 1.0000 | ORAL_TABLET | Freq: Two times a day (BID) | ORAL | 0 refills | Status: DC | PRN

## 2021-08-12 NOTE — Therapy (Signed)
OUTPATIENT PHYSICAL THERAPY TREATMENT NOTE   Patient Name: Amy Hooper MRN: [id number] DOB:09-17-1950, 71 y.o., female Today's Date: 08/12/2021  PCP: Gretchen Portela, MD REFERRING PROVIDER: [name], MD  END OF SESSION:   PT End of Session - 08/12/21 1438     Visit Number 3    Number of Visits 6    Date for PT Re-Evaluation 09/09/21    PT Start Time 7425    PT Stop Time 1515    PT Time Calculation (min) 43 min    Activity Tolerance Patient tolerated treatment well;No increased pain    Behavior During Therapy WFL for tasks assessed/performed              Past Medical History:  Diagnosis Date   Chronic kidney disease    Depression    Fibromyalgia    Gout    HTN (hypertension)    Insomnia    Morbid obesity ([place])    Osteoarthritis    Palpitation    Sleep apnea    Past Surgical History:  Procedure Laterality Date   APPLICATION OF WOUND VAC N/A 03/25/2021   Procedure: APPLICATION OF WOUND VAC;  Surgeon: [name], MD;  Location: [place];  Service: Orthopedics;  Laterality: N/A;   APPLICATION OF WOUND VAC N/A 03/30/2021   Procedure: WOUND VAC CHANGE 12x6x5;  Surgeon: [name], MD;  Location: [place];  Service: Orthopedics;  Laterality: N/A;   INCISION AND DRAINAGE OF WOUND N/A 03/25/2021   Procedure: LUMBAR POST OP INCISION IRRIGATION;  Surgeon: [name], MD;  Location: [place];  Service: Orthopedics;  Laterality: N/A;   JOINT REPLACEMENT     KNEE ARTHROSCOPY     LUMBAR WOUND DEBRIDEMENT N/A 03/30/2021   Procedure: REPEAT LUMBAR WOUND DEBRIDEMENT;  Surgeon: [name], MD;  Location: [place];  Service: Orthopedics;  Laterality: N/A;   right rotator cuff     ROTATOR CUFF REPAIR Left    TOTAL KNEE ARTHROPLASTY Left 09/08/2020   Procedure: LEFT TOTAL KNEE ARTHROPLASTY;  Surgeon: Leandrew Koyanagi, MD;  Location: [place];  Service: Orthopedics;  Laterality: Left;   TUBAL LIGATION     Patient Active Problem List   Diagnosis Date Noted   Acute on  chronic renal insufficiency 03/27/2021   Postoperative complication of skin involving drainage from surgical wound 03/25/2021   Post op infection 03/24/2021   S/P lumbar fusion 03/24/2021   Lumbar stenosis 03/09/2021   Degenerative spondylolisthesis 02/19/2021   Primary osteoarthritis of left knee 09/08/2020   DJD (degenerative joint disease) of knee 09/08/2020   Status post total left knee replacement 09/08/2020   Spondylosis without myelopathy or radiculopathy, lumbar region 07/17/2020   Chronic pain syndrome 07/17/2020   Myalgia 07/17/2020   OSA (obstructive sleep apnea) 05/19/2020   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/17/2020   Sleep disturbance 04/17/2020   Chronic pain of both shoulders 03/03/2020   Pain in both hands 03/03/2020   Primary osteoarthritis of both knees 03/03/2020   Pain in both feet 03/03/2020   History of chronic kidney disease 03/03/2020   DDD (degenerative disc disease), lumbar 02/05/2020   Vitamin D deficiency 01/09/2020   Hyperlipidemia 01/09/2020   CKD (chronic kidney disease) stage 3, GFR 30-59 ml/min ([place]) 01/09/2020   HTN (hypertension)    Morbid obesity ([facility])    Gout    Fibromyalgia    Depression    Insomnia     REFERRING DIAG: Z98.1 (ICD-10-CM) - S/P lumbar  fusion L76.82 (ICD-10-CM) - Postoperative complication of skin involving drainage from surgical wound   THERAPY DIAG:  Other low back pain  Muscle weakness (generalized)  Abnormal posture  Other symptoms and signs involving the musculoskeletal system  PERTINENT HISTORY: Fibromyalgia, Lt TKA, Rt knee OA, depression, gout, Lt RTC repair  PRECAUTIONS: Back and Other: on oral antibiotics for 1 year  SUBJECTIVE: "I'm just miserable." Reports she had difficulty with getting pain medication.  Rt knee is more uncomfortable recently  PAIN:  Are you having pain? Yes: NPRS scale: 4/10 Pain location: Low back and R hip bursitis Pain description: Burning Aggravating factors:  Movement, difficulty at night Relieving factors: Rest   OBJECTIVE: (objective measures completed at initial evaluation unless otherwise dated)    PATIENT SURVEYS:  07/29/21: FOTO will need to capture next visit   SCREENING FOR RED FLAGS: Bowel or bladder incontinence: No Spinal tumors: No Cauda equina syndrome: No Compression fracture: No Abdominal aneurysm: No   COGNITION:           Overall cognitive status: Within functional limits for tasks assessed                          SENSATION: 07/29/21: numbness and tingling in Rt foot post-op   POSTURE:  Rounded shoulders   SKIN INTEGRITY: Healing wound noted on lumbar spine, no obvious signs of infection at this time   LUMBAR ROM: deferred today   LE MMT:   MMT Right 07/29/2021 Left 07/29/2021  Hip flexion 3/5 3/5  Hip extension      Hip abduction 4/5 4/5  Hip adduction 4/5 4/5  Hip internal rotation      Hip external rotation      Knee flexion 3/5 5/5  Knee extension 3/5 5/5  Ankle dorsiflexion 4/5 5/5   (Blank rows = not tested)   FUNCTIONAL TESTS:  5 times sit to stand: 28.1 sec without UE strength 3 minute walk test: 266' with SPC, RPE1-2/10   GAIT: Distance walked: see above Assistive device utilized: Single point cane Level of assistance: Modified independence Comments: decreased RLE clearance noted       TODAY'S TREATMENT  08/12/21 Therex:      Sitting: LAQ 5# 2x10 bil Sit to/from stand x10 with 2# ball overhead reach     Standing: Rows L3 band 3x10 Extension L3 band 3x10 Calf raises x 20 reps Hip abduction 2x10 bil with L4 band Hip ext 2x10 bil with L4 band     Aerobic: NuStep L7 x 8 min   08/05/2021: Therapeutic Exercises: Trunk extension AROM (hands low to get L5-S1) limited range 10X 3 seconds Shoulder blade pinches 10X 5 seconds Standing marching with good posture 20X Standing hip abduction with good posture 20X Standing hip extension with good posture 20X Heel to toe raises with  transversus abdominus activation 20X Sit to stand high seat 5X slow eccentrics (maintain lumbar lordosis)  Therapeutic Activities: Reviewed spine anatomy with spine model, practical kitchen mechanics (get in and out of cabinets), log roll, golfers and diagonal lifts   07/29/21: Discussed walking program and how to work on endurance at home, reviewed HEP and demonstrated exercises with pt - pt verbalized understanding.     PATIENT EDUCATION:  Education details: HEP        Person educated: Patient Education method: Consulting civil engineer, Media planner, and Handouts Education comprehension: verbalized understanding     HOME EXERCISE PROGRAM: Access Code: [email or web address] URL: [email or web address] Date: 08/05/2021 Prepared by:  Vista Mink  Exercises - Sit to Stand  - 1 x daily - 7 x weekly - 1 sets - 10 reps - Standing Hip Abduction with Counter Support  - 1 x daily - 7 x weekly - 1 sets - 20 reps - Standing Hip Extension with Counter Support  - 1 x daily - 7 x weekly - 1 sets - 20 reps - Standing March with Counter Support  - 1 x daily - 7 x weekly - 1 sets - 20 reps - Heel Raises with Counter Support  - 1 x daily - 7 x weekly - 1 sets - 20 reps - Standing Scapular Retraction  - 5 x daily - 7 x weekly - 1 sets - 5 reps - 5 second hold - Standing Lumbar Extension at Wall - Forearms  - 5 x daily - 7 x weekly - 1 sets - 5 reps - 3 seconds hold  Patient Education - walking program    ASSESSMENT:   CLINICAL IMPRESSION: Pt able to tolerate progression of exercises today well without difficulty needing occasional rest breaks.  Will continue to benefit from PT to maximize function.     OBJECTIVE IMPAIRMENTS Abnormal gait, decreased activity tolerance, decreased balance, decreased endurance, decreased knowledge of use of DME, decreased mobility, difficulty walking, decreased strength, impaired flexibility, postural dysfunction, and pain.    ACTIVITY LIMITATIONS cleaning, community  activity, meal prep, yard work, and shopping.    PERSONAL FACTORS Past/current experiences, Time since onset of injury/illness/exacerbation, and 3+ comorbidities: Fibromyalgia, Lt TKA, Rt knee OA, depression, gout, Lt RTC repair  are also affecting patient's functional outcome.      REHAB POTENTIAL: Good   CLINICAL DECISION MAKING: Unstable/unpredictable   EVALUATION COMPLEXITY: High     GOALS: Goals reviewed with patient? Yes   SHORT TERM GOALS: Target date: 08/19/2021   Independent with initial HEP Goal status: On Going 08/05/2021   2.  5x STS < 22 sec for improved functional strength Goal status: INITIAL   LONG TERM GOALS: Target date: 09/09/2021   Independent with final HEP Goal status: INITIAL   2.  FOTO score improved to (will update once complete) Goal status: INITIAL   3.  5x STS improved to < 19 sec for improved functional strength Goal status: INIITAL   4.  Report pain < 5/10 with standing and walking for improved function Goal status: INITIAL   5.  Improve 3MWT to at least 320' for improved endurance and mobility Goal status: INITIAL     PLAN: PT FREQUENCY: 1x/week   PT DURATION: 6 weeks   PLANNED INTERVENTIONS: Therapeutic exercises, Therapeutic activity, Neuromuscular re-education, Balance training, Gait training, Patient/Family education, Joint mobilization, Stair training, DME instructions, Electrical stimulation, Spinal mobilization, Cryotherapy, Moist heat, Taping, and Manual therapy.   PLAN FOR NEXT SESSION: continue with endurance and low back/LE strengthening        Laureen Abrahams, PT, DPT 08/12/21 3:30 PM

## 2021-08-12 NOTE — Telephone Encounter (Signed)
Urine drug screen for this encounter is consistent for prescribed medication 

## 2021-08-18 ENCOUNTER — Ambulatory Visit: Payer: Commercial Managed Care - PPO | Admitting: Orthopaedic Surgery

## 2021-08-19 ENCOUNTER — Ambulatory Visit (INDEPENDENT_AMBULATORY_CARE_PROVIDER_SITE_OTHER): Payer: Commercial Managed Care - PPO | Admitting: Physical Therapy

## 2021-08-19 ENCOUNTER — Encounter: Payer: Self-pay | Admitting: Physical Therapy

## 2021-08-19 DIAGNOSIS — M6281 Muscle weakness (generalized): Secondary | ICD-10-CM | POA: Diagnosis not present

## 2021-08-19 DIAGNOSIS — M5459 Other low back pain: Secondary | ICD-10-CM | POA: Diagnosis not present

## 2021-08-19 DIAGNOSIS — R29898 Other symptoms and signs involving the musculoskeletal system: Secondary | ICD-10-CM

## 2021-08-19 DIAGNOSIS — R293 Abnormal posture: Secondary | ICD-10-CM | POA: Diagnosis not present

## 2021-08-19 NOTE — Therapy (Signed)
OUTPATIENT PHYSICAL THERAPY TREATMENT NOTE   Patient Name: Amy Hooper MRN: [id number] DOB:[birth year], 71 y.o., female Today's Date: 08/19/2021  PCP: Gretchen Portela, MD REFERRING PROVIDER: [name], MD  END OF SESSION:   PT End of Session - 08/19/21 1428     Visit Number 4    Number of Visits 6    Date for PT Re-Evaluation 09/09/21    PT Start Time 7793    PT Stop Time 1508    PT Time Calculation (min) 40 min    Activity Tolerance Patient tolerated treatment well;No increased pain    Behavior During Therapy WFL for tasks assessed/performed               Past Medical History:  Diagnosis Date   Chronic kidney disease    Depression    Fibromyalgia    Gout    HTN (hypertension)    Insomnia    Morbid obesity ([place])    Osteoarthritis    Palpitation    Sleep apnea    Past Surgical History:  Procedure Laterality Date   APPLICATION OF WOUND VAC N/A 03/25/2021   Procedure: APPLICATION OF WOUND VAC;  Surgeon: [name], MD;  Location: [place];  Service: Orthopedics;  Laterality: N/A;   APPLICATION OF WOUND VAC N/A 03/30/2021   Procedure: WOUND VAC CHANGE 12x6x5;  Surgeon: [name], MD;  Location: [place];  Service: Orthopedics;  Laterality: N/A;   INCISION AND DRAINAGE OF WOUND N/A 03/25/2021   Procedure: LUMBAR POST OP INCISION IRRIGATION;  Surgeon: [name], MD;  Location: [place];  Service: Orthopedics;  Laterality: N/A;   JOINT REPLACEMENT     KNEE ARTHROSCOPY     LUMBAR WOUND DEBRIDEMENT N/A 03/30/2021   Procedure: REPEAT LUMBAR WOUND DEBRIDEMENT;  Surgeon: [name], MD;  Location: [place];  Service: Orthopedics;  Laterality: N/A;   right rotator cuff     ROTATOR CUFF REPAIR Left    TOTAL KNEE ARTHROPLASTY Left 09/08/2020   Procedure: LEFT TOTAL KNEE ARTHROPLASTY;  Surgeon: Leandrew Koyanagi, MD;  Location: [place];  Service: Orthopedics;  Laterality: Left;   TUBAL LIGATION     Patient Active Problem List   Diagnosis Date Noted   Acute on  chronic renal insufficiency 03/27/2021   Postoperative complication of skin involving drainage from surgical wound 03/25/2021   Post op infection 03/24/2021   S/P lumbar fusion 03/24/2021   Lumbar stenosis 03/09/2021   Degenerative spondylolisthesis 02/19/2021   Primary osteoarthritis of left knee 09/08/2020   DJD (degenerative joint disease) of knee 09/08/2020   Status post total left knee replacement 09/08/2020   Spondylosis without myelopathy or radiculopathy, lumbar region 07/17/2020   Chronic pain syndrome 07/17/2020   Myalgia 07/17/2020   OSA (obstructive sleep apnea) 05/19/2020   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/17/2020   Sleep disturbance 04/17/2020   Chronic pain of both shoulders 03/03/2020   Pain in both hands 03/03/2020   Primary osteoarthritis of both knees 03/03/2020   Pain in both feet 03/03/2020   History of chronic kidney disease 03/03/2020   DDD (degenerative disc disease), lumbar 02/05/2020   Vitamin D deficiency 01/09/2020   Hyperlipidemia 01/09/2020   CKD (chronic kidney disease) stage 3, GFR 30-59 ml/min ([place]) 01/09/2020   HTN (hypertension)    Morbid obesity ([facility])    Gout    Fibromyalgia    Depression    Insomnia     REFERRING DIAG: Z98.1 (ICD-10-CM) - S/P  lumbar fusion L76.82 (ICD-10-CM) - Postoperative complication of skin involving drainage from surgical wound   THERAPY DIAG:  Other low back pain  Muscle weakness (generalized)  Abnormal posture  Other symptoms and signs involving the musculoskeletal system  PERTINENT HISTORY: Fibromyalgia, Lt TKA, Rt knee OA, depression, gout, Lt RTC repair  PRECAUTIONS: Back and Other: on oral antibiotics for 1 year  SUBJECTIVE: "today is not a good day." Reports still having difficulty with getting her pain medications  PAIN:  Are you having pain? Yes: NPRS scale: 9.5/10 Pain location: Low back and R hip bursitis Pain description: Burning Aggravating factors: Movement, difficulty at  night Relieving factors: Rest   OBJECTIVE: (objective measures completed at initial evaluation unless otherwise dated)    PATIENT SURVEYS:  07/29/21: FOTO will need to capture next visit   COGNITION:           Overall cognitive status: Within functional limits for tasks assessed                          SENSATION: 07/29/21: numbness and tingling in Rt foot post-op   POSTURE:  Rounded shoulders   SKIN INTEGRITY: Healing wound noted on lumbar spine, no obvious signs of infection at this time   LUMBAR ROM: deferred today   LE MMT:   MMT Right 07/29/2021 Left 07/29/2021  Hip flexion 3/5 3/5  Hip extension      Hip abduction 4/5 4/5  Hip adduction 4/5 4/5  Hip internal rotation      Hip external rotation      Knee flexion 3/5 5/5  Knee extension 3/5 5/5  Ankle dorsiflexion 4/5 5/5   (Blank rows = not tested)   FUNCTIONAL TESTS:  5 times sit to stand: 28.1 sec without UE strength 3 minute walk test: 266' with SPC, RPE1-2/10   GAIT: Distance walked: see above Assistive device utilized: Single point cane Level of assistance: Modified independence Comments: decreased RLE clearance noted       TODAY'S TREATMENT  08/19/21 Therex:      Aerobic: NuStep L5 x 10 min     Sitting: LAQ 4# 2x10 bil (decr weight due to incr pain) Sit to/from stand x 10 reps without UE support Ball squeeze (hip adduction) 2x10 reps Hip abduction L3 band 2x10     Standing: Heel raises 2x10 Hip abduction 2 x 10 reps bil Hip extension 2 x 10 reps bil   08/12/21 Therex:      Sitting: LAQ 5# 2x10 bil Sit to/from stand x10 with 2# ball overhead reach     Standing: Rows L3 band 3x10 Extension L3 band 3x10 Calf raises x 20 reps Hip abduction 2x10 bil with L4 band Hip ext 2x10 bil with L4 band     Aerobic: NuStep L7 x 8 min   08/05/2021: Therapeutic Exercises: Trunk extension AROM (hands low to get L5-S1) limited range 10X 3 seconds Shoulder blade pinches 10X 5 seconds Standing  marching with good posture 20X Standing hip abduction with good posture 20X Standing hip extension with good posture 20X Heel to toe raises with transversus abdominus activation 20X Sit to stand high seat 5X slow eccentrics (maintain lumbar lordosis)  Therapeutic Activities: Reviewed spine anatomy with spine model, practical kitchen mechanics (get in and out of cabinets), log roll, golfers and diagonal lifts   07/29/21: Discussed walking program and how to work on endurance at home, reviewed HEP and demonstrated exercises with pt - pt verbalized understanding.  PATIENT EDUCATION:  Education details: HEP        Person educated: Patient Education method: Consulting civil engineer, Media planner, and Handouts Education comprehension: verbalized understanding     HOME EXERCISE PROGRAM: Access Code: [email or web address] URL: [email or web address] Date: 08/05/2021 Prepared by: Vista Mink  Exercises - Sit to Stand  - 1 x daily - 7 x weekly - 1 sets - 10 reps - Standing Hip Abduction with Counter Support  - 1 x daily - 7 x weekly - 1 sets - 20 reps - Standing Hip Extension with Counter Support  - 1 x daily - 7 x weekly - 1 sets - 20 reps - Standing March with Counter Support  - 1 x daily - 7 x weekly - 1 sets - 20 reps - Heel Raises with Counter Support  - 1 x daily - 7 x weekly - 1 sets - 20 reps - Standing Scapular Retraction  - 5 x daily - 7 x weekly - 1 sets - 5 reps - 5 second hold - Standing Lumbar Extension at [place]  - 5 x daily - 7 x weekly - 1 sets - 5 reps - 3 seconds hold  Patient Education - walking program    ASSESSMENT:   CLINICAL IMPRESSION: Pt with increased pain today so session limited by pain levels.  Will continue to benefit from PT to maximize function.  OBJECTIVE IMPAIRMENTS Abnormal gait, decreased activity tolerance, decreased balance, decreased endurance, decreased knowledge of use of DME, decreased mobility, difficulty walking, decreased strength,  impaired flexibility, postural dysfunction, and pain.    ACTIVITY LIMITATIONS cleaning, community activity, meal prep, yard work, and shopping.    PERSONAL FACTORS Past/current experiences, Time since onset of injury/illness/exacerbation, and 3+ comorbidities: Fibromyalgia, Lt TKA, Rt knee OA, depression, gout, Lt RTC repair  are also affecting patient's functional outcome.      REHAB POTENTIAL: Good   CLINICAL DECISION MAKING: Unstable/unpredictable   EVALUATION COMPLEXITY: High     GOALS: Goals reviewed with patient? Yes   SHORT TERM GOALS: Target date: 08/19/2021   Independent with initial HEP Goal status: On Going 08/05/2021   2.  5x STS < 22 sec for improved functional strength Goal status: INITIAL   LONG TERM GOALS: Target date: 09/09/2021   Independent with final HEP Goal status: INITIAL   2.  FOTO score improved to (will update once complete) Goal status: INITIAL   3.  5x STS improved to < 19 sec for improved functional strength Goal status: INIITAL   4.  Report pain < 5/10 with standing and walking for improved function Goal status: INITIAL   5.  Improve 3MWT to at least 320' for improved endurance and mobility Goal status: INITIAL     PLAN: PT FREQUENCY: 1x/week   PT DURATION: 6 weeks   PLANNED INTERVENTIONS: Therapeutic exercises, Therapeutic activity, Neuromuscular re-education, Balance training, Gait training, Patient/Family education, Joint mobilization, Stair training, DME instructions, Electrical stimulation, Spinal mobilization, Cryotherapy, Moist heat, Taping, and Manual therapy.   PLAN FOR NEXT SESSION: continue with endurance and low back/LE strengthening        Laureen Abrahams, PT, DPT 08/19/21 3:08 PM

## 2021-08-21 ENCOUNTER — Encounter: Payer: Self-pay | Admitting: Orthopaedic Surgery

## 2021-08-25 ENCOUNTER — Ambulatory Visit (INDEPENDENT_AMBULATORY_CARE_PROVIDER_SITE_OTHER): Payer: Commercial Managed Care - PPO | Admitting: Orthopaedic Surgery

## 2021-08-25 ENCOUNTER — Encounter: Payer: Self-pay | Admitting: Orthopaedic Surgery

## 2021-08-25 ENCOUNTER — Ambulatory Visit (INDEPENDENT_AMBULATORY_CARE_PROVIDER_SITE_OTHER): Payer: Commercial Managed Care - PPO

## 2021-08-25 VITALS — BP 106/70 | HR 77 | Temp 98.1°F | Ht 62.0 in | Wt 201.0 lb

## 2021-08-25 VITALS — Ht 62.0 in | Wt 197.0 lb

## 2021-08-25 DIAGNOSIS — M1711 Unilateral primary osteoarthritis, right knee: Secondary | ICD-10-CM | POA: Diagnosis not present

## 2021-08-25 DIAGNOSIS — Z981 Arthrodesis status: Secondary | ICD-10-CM

## 2021-08-25 DIAGNOSIS — M25561 Pain in right knee: Secondary | ICD-10-CM

## 2021-08-25 MED ORDER — LIDOCAINE HCL 1 % IJ SOLN
2.0000 mL | INTRAMUSCULAR | Status: AC | PRN
  Administered 2021-08-25: 2 mL

## 2021-08-25 MED ORDER — METHYLPREDNISOLONE ACETATE 40 MG/ML IJ SUSP
40.0000 mg | INTRAMUSCULAR | Status: AC | PRN
  Administered 2021-08-25: 40 mg via INTRA_ARTICULAR

## 2021-08-25 MED ORDER — BUPIVACAINE HCL 0.5 % IJ SOLN
2.0000 mL | INTRAMUSCULAR | Status: AC | PRN
  Administered 2021-08-25: 2 mL via INTRA_ARTICULAR

## 2021-08-25 NOTE — Progress Notes (Unsigned)
[name] DOB: Sep 23, 1950  REASON FOR VISIT: follow up HISTORY FROM: patient  No chief complaint on file.    HISTORY OF PRESENT ILLNESS:  08/25/21 ALL:  Amy Hooper returns for follow up for OSA on CPAP. She was last seen 11/2020 and doing well, however, had sub optimal 4 hour compliance. She had noted improvement in sleep quality and daytime energy had improved. She was encouraged to continue daily CPAp therapy for at least 4 hours every night. Since,   12/02/2020 ALL:  Amy Hooper is a 71 y.o. female here today for follow up for OSA on CPAP. HST on 04/28/2020 showed moderate OSA with total AHI 18.2/hr and O2 nadir of 84%. She is doing well on CPAP therapy. She did have an adjustment period but feels that she is now very comfortable using CPAP. She feels she is resting better. Daytime energy has improved. She no longer snores. She admits that she does fall asleep before starting therapy contributing to lower compliance. She is feeling well, today, and without concerns.   Compliance report dated 11/02/2020-12/01/2020 shows that she used CPAP 22/30 days for compliance of 73%. She used CPAP greater than 4 hours 20/30 days for compliance of 67%. Average usage on days used was 5.7 hours. Residual AHI 1.5/hr on 5-11cmH20. No significant leak noted.   HISTORY: (copied from Dr Guadelupe Sabin previous note)  Dear Dr. Isaac Bliss,   I saw your patient, Amy Hooper, upon your kind request in the sleep clinic today for initial consultation of her sleep disorder, in particular, concern for underlying obstructive sleep apnea.  The patient is unaccompanied today.  As you know, Amy Hooper is a 71 year old right-handed woman with an underlying medical history of osteoarthritis with status post bilateral knee arthroscopic surgeries, bilateral shoulder arthroscopic surgeries, hypertension, gout, depression, fibromyalgia, degenerative disc disease of the lumbar spine, chronic kidney  disease, palpitations and obesity, who reports snoring and excessive daytime somnolence.  I reviewed your office note from January 08, 2020.  Her Epworth sleepiness score is 14 out of 24, fatigue severity score is 60 out of 63.  She is married and lives with her husband, her sister and her mom.  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  She is a non-smoker and does not currently drink any alcohol, drinks caffeine in the form of coffee, 1 cup/day on average. She has had difficulty going to sleep, this has been going on for quite some time, for the past year she has been on Lunesta 2 mg at bedtime and reports that she is not able to sleep without it.  She had tried Benadryl before then.  She reports a family history of sleep apnea, her father had a CPAP machine.  He died at 22.  She has 3 grown children.  She does not watch TV in her bedroom.  She tries to be in bed around 10 PM and rise time is generally around 6 AM.  She is planning to retire.  She will need knee replacement surgery most likely to the left knee, she is planning to have this done in or around August of this year. She has nocturia about 3 times per average 9.  She has radiating low back pain which bothers her often at night as well.   REVIEW OF SYSTEMS: Out of a complete 14 system review of symptoms, the patient complains only of the following symptoms, back pain and all other reviewed systems are negative.  ESS: 6/24, previously 14/24   ALLERGIES: Allergies  Allergen Reactions   Cefuroxime Axetil Rash and Hives   Clarithromycin Rash and Hives    Other reaction(s): Unknown   Nsaids Nausea And Vomiting    Other reaction(s): Unknown   Cefuroxime     Other reaction(s): Unknown   Oxycodone Other (See Comments)    hallucinations   Oxycodone-Acetaminophen Other (See Comments)    Hallucination Other reaction(s): Unknown   Robaxin [Methocarbamol] Nausea And Vomiting    "It tears my stomach up."   Toradol [Ketorolac  Tromethamine] Nausea And Vomiting   Tramadol Nausea And Vomiting and Other (See Comments)    Upset stomach Other reaction(s): Unknown    HOME MEDICATIONS: Outpatient Medications Prior to Visit  Medication Sig Dispense Refill   allopurinol (ZYLOPRIM) 100 MG tablet TAKE 2 TABLETS BY MOUTH EVERY DAY 180 tablet 1   amitriptyline (ELAVIL) 10 MG tablet Take 1 tablet (10 mg total) by mouth at bedtime. 30 tablet 1   amLODipine (NORVASC) 5 MG tablet TAKE 1 TABLET (5 MG TOTAL) BY MOUTH DAILY. 90 tablet 0   atorvastatin (LIPITOR) 10 MG tablet TAKE 1 TABLET BY MOUTH EVERYDAY AT BEDTIME 90 tablet 1   diazepam (VALIUM) 5 MG tablet Take 1 by mouth 1 hour  pre-procedure with very light food. May bring 2nd tablet to appointment. 2 tablet 0   DULoxetine (CYMBALTA) 20 MG capsule Take 1 capsule (20 mg total) by mouth daily. 30 capsule 3   eszopiclone (LUNESTA) 2 MG TABS tablet TAKE 1 TABLET BY MOUTH AT NIGHT IMMEDIATELY BEFORE BEDTIME 30 tablet 1   fluticasone (FLONASE) 50 MCG/ACT nasal spray Place 1 spray into both nostrils daily as needed for allergies.     HYDROcodone-Acetaminophen 10-300 MG TABS Take 1 tablet by mouth 2 (two) times daily as needed. 60 tablet 0   hydroxychloroquine (PLAQUENIL) 200 MG tablet Take 1 tablet (200 mg total) by mouth daily. 90 tablet 0   hydrOXYzine (VISTARIL) 100 MG capsule Take 1 capsule (100 mg total) by mouth 3 (three) times daily as needed for itching. 20 capsule 0   levofloxacin (LEVAQUIN) 250 MG tablet Take 250 mg by mouth daily.     lidocaine (LIDODERM) 5 % Place 1 patch onto the skin daily. Remove & Discard patch within 12 hours or as directed by MD 30 patch 0   loratadine (CLARITIN) 10 MG tablet Take 10 mg by mouth daily as needed for allergies.     losartan (COZAAR) 50 MG tablet Take 1 tablet (50 mg total) by mouth daily. 90 tablet 1   metoCLOPramide (REGLAN) 5 MG tablet Take 1-2 tablets (5-10 mg total) by mouth every 8 (eight) hours as needed for nausea (if ondansetron  (ZOFRAN) ineffective.). 50 tablet 1   metroNIDAZOLE (FLAGYL) 500 MG tablet Take 1 tablet (500 mg total) by mouth 2 (two) times daily. 60 tablet 5   ondansetron (ZOFRAN) 4 MG tablet Take 1 tablet (4 mg total) by mouth every 8 (eight) hours as needed for nausea or vomiting. 20 tablet 0   ondansetron (ZOFRAN) 4 MG tablet Take 1 tablet (4 mg total) by mouth every 8 (eight) hours as needed for nausea or vomiting. 20 tablet 0   predniSONE (DELTASONE) 10 MG tablet Take 10 mg by mouth as needed. (Patient not taking: Reported on 06/19/2021)     pregabalin (LYRICA) 75 MG capsule Take 1 capsule (75 mg total) by mouth 3 (three) times daily. 90 capsule 3   Saccharomyces boulardii (PROBIOTIC) 250  MG CAPS Take 1 tablet by mouth in the morning and at bedtime. 60 capsule 5   tiZANidine (ZANAFLEX) 4 MG tablet Take 1 tablet (4 mg total) by mouth every 8 (eight) hours as needed for muscle spasms. 60 tablet 0   Vitamin D, Ergocalciferol, (DRISDOL) 1.25 MG (50000 UNIT) CAPS capsule Take 1 capsule (50,000 Units total) by mouth every 7 (seven) days. 7 capsule 0   No facility-administered medications prior to visit.    PAST MEDICAL HISTORY: Past Medical History:  Diagnosis Date   Chronic kidney disease    Depression    Fibromyalgia    Gout    HTN (hypertension)    Insomnia    Morbid obesity ([place])    Osteoarthritis    Palpitation    Sleep apnea     PAST SURGICAL HISTORY: Past Surgical History:  Procedure Laterality Date   APPLICATION OF WOUND VAC N/A 03/25/2021   Procedure: APPLICATION OF WOUND VAC;  Surgeon: [name], MD;  Location: [facility];  Service: Orthopedics;  Laterality: N/A;   APPLICATION OF WOUND VAC N/A 03/30/2021   Procedure: WOUND VAC CHANGE 12x6x5;  Surgeon: [name], MD;  Location: [facility];  Service: Orthopedics;  Laterality: N/A;   INCISION AND DRAINAGE OF WOUND N/A 03/25/2021   Procedure: LUMBAR POST OP INCISION IRRIGATION;  Surgeon: [name], MD;  Location: [facility];  Service:  Orthopedics;  Laterality: N/A;   JOINT REPLACEMENT     KNEE ARTHROSCOPY     LUMBAR WOUND DEBRIDEMENT N/A 03/30/2021   Procedure: REPEAT LUMBAR WOUND DEBRIDEMENT;  Surgeon: [name], MD;  Location: [facility];  Service: Orthopedics;  Laterality: N/A;   right rotator cuff     ROTATOR CUFF REPAIR Left    TOTAL KNEE ARTHROPLASTY Left 09/08/2020   Procedure: LEFT TOTAL KNEE ARTHROPLASTY;  Surgeon: Leandrew Koyanagi, MD;  Location: [facility];  Service: Orthopedics;  Laterality: Left;   TUBAL LIGATION      FAMILY HISTORY: Family History  Problem Relation Age of Onset   Hypothyroidism Mother    Hyperlipidemia Other    Kidney failure Father    Dementia Father    Gout Father    Arthritis Father    Prostate cancer Father     SOCIAL HISTORY: Social History   Socioeconomic History   Marital status: Married    Spouse name: Not on file   Number of children: 3   Years of education: Not on file   Highest education level: Bachelor's degree (e.g., BA, AB, BS)  Occupational History   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     Social Determinants of Health   Financial Resource Strain: Low Risk    Difficulty of Paying Living Expenses: Not hard at all  Food Insecurity: No Food Insecurity   Worried About Charity fundraiser in the Last Year: Never true   [place] in the Last Year: Never true  Transportation Needs: No Transportation Needs   Lack of Transportation (Medical): No   Lack of Transportation (Non-Medical): No  Physical Activity: Unknown   Days of Exercise per  Week: 0 days   Minutes of Exercise per Session: Not on file  Stress: No Stress Concern Present   Feeling of Stress :  Only a little  Social Connections: Moderately Isolated   Frequency of Communication with Friends and Family: More than three times a week   Frequency of Social Gatherings with Friends and Family: Once a week   Attends Religious Services: Never   Marine scientist or Organizations: No   Attends Music therapist: Not on file   Marital Status: Married  Human resources officer Violence: Not on file     PHYSICAL EXAM  There were no vitals filed for this visit.  There is no height or weight on file to calculate BMI.  Generalized: Well developed, in no acute distress  Cardiology: normal rate and rhythm, no murmur noted Respiratory: clear to auscultation bilaterally  Neurological examination  Mentation: Alert oriented to time, place, history taking. Follows all commands speech and language fluent Cranial nerve II-XII: Pupils were equal round reactive to light. Extraocular movements were full, visual field were full  Motor: The motor testing reveals 5 over 5 strength of all 4 extremities. Good symmetric motor tone is noted throughout.  Gait and station: Gait is normal.    DIAGNOSTIC DATA (LABS, IMAGING, TESTING) - I reviewed patient records, labs, notes, testing and imaging myself where available.      View : No data to display.           Lab Results  Component Value Date   WBC 5.3 07/30/2021   HGB 12.0 07/30/2021   HCT 36.7 07/30/2021   MCV 87.0 07/30/2021   PLT 332 07/30/2021      Component Value Date/Time   NA 140 07/30/2021 1131   NA 142 02/06/2021 1114   K 4.2 07/30/2021 1131   CL 105 07/30/2021 1131   CO2 24 07/30/2021 1131   GLUCOSE 92 07/30/2021 1131   BUN 17 07/30/2021 1131   BUN 23 02/06/2021 1114   CREATININE 1.08 (H) 07/30/2021 1131   CALCIUM 9.5 07/30/2021 1131   PROT 7.0 07/30/2021 1131   ALBUMIN 2.7 (L) 03/30/2021 0246   AST 13 07/30/2021 1131   ALT 7 07/30/2021 1131   ALKPHOS 130 (H) 03/30/2021 0246   BILITOT 0.4 07/30/2021 1131    GFRNONAA 38 (L) 04/03/2021 0330   GFRNONAA 49 (L) 02/05/2020 0902   GFRAA 56 (L) 02/05/2020 0902   Lab Results  Component Value Date   CHOL 169 09/03/2020   HDL 70.70 09/03/2020   LDLCALC 90 09/03/2020   TRIG 42.0 09/03/2020   CHOLHDL 2 09/03/2020   Lab Results  Component Value Date   HGBA1C 5.7 09/03/2020   Lab Results  Component Value Date   VITAMINB12 259 09/03/2020   Lab Results  Component Value Date   TSH 1.44 09/03/2020     ASSESSMENT AND PLAN 71 y.o. year old female  has a past medical history of Chronic kidney disease, Depression, Fibromyalgia, Gout, HTN (hypertension), Insomnia, Morbid obesity ([place]), Osteoarthritis, Palpitation, and Sleep apnea. here with   No diagnosis found.    Amy Hooper is doing well on CPAP therapy. Compliance report reveals acceptable daily usage but sub optimal four hour usage. She was encouraged to continue using CPAP nightly and for greater than 4 hours each night. I have suggested she consider a bedtime alarm to help her remember to start CPAP therapy. Risks of untreated sleep apnea review and education materials provided. Healthy lifestyle habits encouraged.  She will follow up in 4-6 months, sooner if needed. She verbalizes understanding and agreement with this plan.    No orders of the defined types were placed in this encounter.     No orders of the defined types were placed in this encounter.      Debbora Presto, FNP-C 08/25/2021, 12:18 PM [facility] [address] [place], [place] [address] [phone]

## 2021-08-25 NOTE — Progress Notes (Addendum)
Office Visit Note   [name]           Date of Birth: [birth year]           MRN: [id number] Visit Date: 08/25/2021              Requested by: Isaac Bliss, [name], MD [place],  [place] [address] PCP: Isaac Bliss, [name], MD   Assessment & Plan: Visit Diagnoses:  1. Primary osteoarthritis of right knee     Plan: Impression is moderately advanced right knee DJD.  Cortisone injection administered today.  We will do these periodically when she needs them.  She understands that we cannot proceed with a total knee replacement until the back wound is fully healed and that she has been off of antibiotics for at least 2 months.  Patient needs custom brace due to thigh to calf ratio  Follow-Up Instructions: No follow-ups on file.   Orders:  Orders Placed This Encounter  Procedures   Large Joint Inj   XR KNEE 3 VIEW RIGHT   No orders of the defined types were placed in this encounter.     Procedures: Large Joint Inj: R knee on 08/25/2021 3:38 PM Indications: pain Details: 22 G needle  Arthrogram: No  Medications: 40 mg methylPREDNISolone acetate 40 MG/ML; 2 mL lidocaine 1 %; 2 mL bupivacaine 0.5 % Consent was given by the patient. Patient was prepped and draped in the usual sterile fashion.      Clinical Data: No additional findings.   Subjective: Chief Complaint  Patient presents with   Right Knee - Pain    HPI Amy Hooper returns today for chronic right knee pain and DJD.  Would like an injection today.  She is still recovering from a post surgical wound from her lumbar spine surgery 6 months ago.  Currently on chronic antibiotics.  Sees infectious diseases every 3 months.  Review of Systems   Objective: Vital Signs: Ht '5\' 2"'$  (1.575 m)   Wt 197 lb (89.4 kg)   BMI 36.03 kg/m   Physical Exam  Ortho Exam Examination of right knee is unchanged. Slight instability with varus valgus stress Specialty Comments:   No specialty comments available.  Imaging: XR KNEE 3 VIEW RIGHT  Result Date: 08/25/2021 Moderately advanced tricompartmental DJD.   XR Lumbar Spine 2-3 Views  Result Date: 08/25/2021 AP lateral lumbar spine images are obtained and reviewed.  This shows single level fusion L4-5 with unchanged position of cage and pedicle screws since her 03/09/2021 images and postop image where staples were intact.  No loosening or cage subsidence.  Some bone incorporation anterior to the cage. Impression: Post single level fusion satisfactory images at L4-5.    PMFS History: Patient Active Problem List   Diagnosis Date Noted   Acute on chronic renal insufficiency 03/27/2021   Postoperative complication of skin involving drainage from surgical wound 03/25/2021   Post op infection 03/24/2021   S/P lumbar fusion 03/24/2021   Lumbar stenosis 03/09/2021   Degenerative spondylolisthesis 02/19/2021   Primary osteoarthritis of left knee 09/08/2020   DJD (degenerative joint disease) of knee 09/08/2020   Status post total left knee replacement 09/08/2020   Spondylosis without myelopathy or radiculopathy, lumbar region 07/17/2020   Chronic pain syndrome 07/17/2020   Myalgia 07/17/2020   OSA (obstructive sleep apnea) 05/19/2020   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/17/2020   Sleep disturbance 04/17/2020   Chronic pain of both shoulders 03/03/2020  Pain in both hands 03/03/2020   Primary osteoarthritis of both knees 03/03/2020   Pain in both feet 03/03/2020   History of chronic kidney disease 03/03/2020   DDD (degenerative disc disease), lumbar 02/05/2020   Vitamin D deficiency 01/09/2020   Hyperlipidemia 01/09/2020   CKD (chronic kidney disease) stage 3, GFR 30-59 ml/min ([place]) 01/09/2020   HTN (hypertension)    Morbid obesity ([place])    Gout    Fibromyalgia    Depression    Insomnia    Past Medical History:  Diagnosis Date   Chronic kidney disease    Depression    Fibromyalgia     Gout    HTN (hypertension)    Insomnia    Morbid obesity ([facility])    Osteoarthritis    Palpitation    Sleep apnea     Family History  Problem Relation Age of Onset   Hypothyroidism Mother    Hyperlipidemia Other    Kidney failure Father    Dementia Father    Gout Father    Arthritis Father    Prostate cancer Father     Past Surgical History:  Procedure Laterality Date   APPLICATION OF WOUND VAC N/A 03/25/2021   Procedure: APPLICATION OF WOUND VAC;  Surgeon: [name], MD;  Location: [facility];  Service: Orthopedics;  Laterality: N/A;   APPLICATION OF WOUND VAC N/A 03/30/2021   Procedure: WOUND VAC CHANGE 12x6x5;  Surgeon: [name], MD;  Location: [facility];  Service: Orthopedics;  Laterality: N/A;   INCISION AND DRAINAGE OF WOUND N/A 03/25/2021   Procedure: LUMBAR POST OP INCISION IRRIGATION;  Surgeon: [name], MD;  Location: [facility];  Service: Orthopedics;  Laterality: N/A;   JOINT REPLACEMENT     KNEE ARTHROSCOPY     LUMBAR WOUND DEBRIDEMENT N/A 03/30/2021   Procedure: REPEAT LUMBAR WOUND DEBRIDEMENT;  Surgeon: [name], MD;  Location: [facility];  Service: Orthopedics;  Laterality: N/A;   right rotator cuff     ROTATOR CUFF REPAIR Left    TOTAL KNEE ARTHROPLASTY Left 09/08/2020   Procedure: LEFT TOTAL KNEE ARTHROPLASTY;  Surgeon: Leandrew Koyanagi, MD;  Location: [facility];  Service: Orthopedics;  Laterality: Left;   TUBAL LIGATION     Social History   Occupational History   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

## 2021-08-25 NOTE — Progress Notes (Signed)
Office Visit Note   [name]           Date of Birth: June 06, 1950           MRN: [id number] Visit Date: 08/25/2021              Requested by: Isaac Bliss, [name], MD [place],  [place] [address] PCP: Isaac Bliss, [name], MD   Assessment & Plan: Visit Diagnoses:  1. S/P lumbar fusion     Plan: Patient can continue with the walking program.  She had increased soreness and discomfort after intercourse.  We discussed this in detail.  I plan to recheck her in 2 months.  She will keep her planned schedule appointment for sed rate CRP with infectious disease.  She will call if she has increase symptoms or increased drainage.  Follow-Up Instructions: Return in about 2 months (around 10/25/2021).   Orders:  Orders Placed This Encounter  Procedures   XR Lumbar Spine 2-3 Views   No orders of the defined types were placed in this encounter.     Procedures: No procedures performed   Clinical Data: No additional findings.   Subjective: Chief Complaint  Patient presents with   Lower Back - Wound Check    HPI 71 year old female returns.  She called and sent photographs of her back and states that the incision had opened up partially last Thursday.  She states when she applied DuoDERM it seems to do better she is gotten this over the Internet with [employer].  She is not having any drainage.  She states she had dyspareunia well with her husband with first-time effort.  She has been followed by infectious disease and remains on antibiotics.  She had problems with GI tract and was recommended to take 1 particular probiotic but got a different one and she states the probiotic that she is taking is working great and solve the problem.  Temperature is 98.1.  Review of Systems all other systems updated unchanged.   Objective: Vital Signs: BP 106/70   Pulse 77   Temp 98.1 F (36.7 C)   Ht '5\' 2"'$  (1.575 m)   Wt 201 lb (91.2 kg)   BMI  36.76 kg/m   Physical Exam Constitutional:      Appearance: She is well-developed.  HENT:     Head: Normocephalic.     Right Ear: External ear normal.     Left Ear: External ear normal. There is no impacted cerumen.  Eyes:     Pupils: Pupils are equal, round, and reactive to light.  Neck:     Thyroid: No thyromegaly.     Trachea: No tracheal deviation.  Cardiovascular:     Rate and Rhythm: Normal rate.  Pulmonary:     Effort: Pulmonary effort is normal.  Abdominal:     Palpations: Abdomen is soft.  Musculoskeletal:     Cervical back: No rigidity.  Skin:    General: Skin is warm and dry.  Neurological:     Mental Status: She is alert and oriented to person, place, and time.  Psychiatric:        Behavior: Behavior normal.    Ortho Exam patient's incision has a depth of 2 mm its pink moist no serous or purulent drainage.  With her normal pants this pulls the tissue together and with sitting and standing there is some change in the gap but no depth.  No cellulitis no fluid is able to  be expressed.  Specialty Comments:  No specialty comments available.  Imaging: XR Lumbar Spine 2-3 Views  Result Date: 08/25/2021 AP lateral lumbar spine images are obtained and reviewed.  This shows single level fusion L4-5 with unchanged position of cage and pedicle screws since her 03/09/2021 images and postop image where staples were intact.  No loosening or cage subsidence.  Some bone incorporation anterior to the cage. Impression: Post single level fusion satisfactory images at L4-5.    PMFS History: Patient Active Problem List   Diagnosis Date Noted   Acute on chronic renal insufficiency 03/27/2021   Postoperative complication of skin involving drainage from surgical wound 03/25/2021   Post op infection 03/24/2021   S/P lumbar fusion 03/24/2021   Lumbar stenosis 03/09/2021   Degenerative spondylolisthesis 02/19/2021   Primary osteoarthritis of left knee 09/08/2020   DJD (degenerative  joint disease) of knee 09/08/2020   Status post total left knee replacement 09/08/2020   Spondylosis without myelopathy or radiculopathy, lumbar region 07/17/2020   Chronic pain syndrome 07/17/2020   Myalgia 07/17/2020   OSA (obstructive sleep apnea) 05/19/2020   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/17/2020   Sleep disturbance 04/17/2020   Chronic pain of both shoulders 03/03/2020   Pain in both hands 03/03/2020   Primary osteoarthritis of both knees 03/03/2020   Pain in both feet 03/03/2020   History of chronic kidney disease 03/03/2020   DDD (degenerative disc disease), lumbar 02/05/2020   Vitamin D deficiency 01/09/2020   Hyperlipidemia 01/09/2020   CKD (chronic kidney disease) stage 3, GFR 30-59 ml/min ([facility]) 01/09/2020   HTN (hypertension)    Morbid obesity ([facility])    Gout    Fibromyalgia    Depression    Insomnia    Past Medical History:  Diagnosis Date   Chronic kidney disease    Depression    Fibromyalgia    Gout    HTN (hypertension)    Insomnia    Morbid obesity ([facility])    Osteoarthritis    Palpitation    Sleep apnea     Family History  Problem Relation Age of Onset   Hypothyroidism Mother    Hyperlipidemia Other    Kidney failure Father    Dementia Father    Gout Father    Arthritis Father    Prostate cancer Father     Past Surgical History:  Procedure Laterality Date   APPLICATION OF WOUND VAC N/A 03/25/2021   Procedure: APPLICATION OF WOUND VAC;  Surgeon: [name], MD;  Location: [facility];  Service: Orthopedics;  Laterality: N/A;   APPLICATION OF WOUND VAC N/A 03/30/2021   Procedure: WOUND VAC CHANGE 12x6x5;  Surgeon: [name], MD;  Location: [facility];  Service: Orthopedics;  Laterality: N/A;   INCISION AND DRAINAGE OF WOUND N/A 03/25/2021   Procedure: LUMBAR POST OP INCISION IRRIGATION;  Surgeon: [name], MD;  Location: [facility];  Service: Orthopedics;  Laterality: N/A;   JOINT REPLACEMENT     KNEE ARTHROSCOPY     LUMBAR WOUND  DEBRIDEMENT N/A 03/30/2021   Procedure: REPEAT LUMBAR WOUND DEBRIDEMENT;  Surgeon: [name], MD;  Location: [facility];  Service: Orthopedics;  Laterality: N/A;   right rotator cuff     ROTATOR CUFF REPAIR Left    TOTAL KNEE ARTHROPLASTY Left 09/08/2020   Procedure: LEFT TOTAL KNEE ARTHROPLASTY;  Surgeon: Leandrew Koyanagi, MD;  Location: [facility];  Service: Orthopedics;  Laterality: Left;   TUBAL LIGATION  Social History   Occupational History   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

## 2021-08-25 NOTE — Patient Instructions (Incomplete)
Please continue using your CPAP regularly. While your insurance requires that you use CPAP at least 4 hours each night on 70% of the nights, I recommend, that you not skip any nights and use it throughout the night if you can. Getting used to CPAP and staying with the treatment long term does take time and patience and discipline. Untreated obstructive sleep apnea when it is moderate to severe can have an adverse impact on cardiovascular health and raise her risk for heart disease, arrhythmias, hypertension, congestive heart failure, stroke and diabetes. Untreated obstructive sleep apnea causes sleep disruption, nonrestorative sleep, and sleep deprivation. This can have an impact on your day to day functioning and cause daytime sleepiness and impairment of cognitive function, memory loss, mood disturbance, and problems focussing. Using CPAP regularly can improve these symptoms.   We will repeat home sleep study based on changes in sleep and weight loss. Please listen out for a call from [facility] to schedule.   Follow up pending results of sleep study

## 2021-08-26 ENCOUNTER — Encounter: Payer: Self-pay | Admitting: Family Medicine

## 2021-08-26 ENCOUNTER — Encounter: Payer: Self-pay | Admitting: Orthopaedic Surgery

## 2021-08-26 ENCOUNTER — Ambulatory Visit: Payer: Commercial Managed Care - PPO | Admitting: Internal Medicine

## 2021-08-26 ENCOUNTER — Ambulatory Visit (INDEPENDENT_AMBULATORY_CARE_PROVIDER_SITE_OTHER): Payer: Commercial Managed Care - PPO | Admitting: Family Medicine

## 2021-08-26 VITALS — BP 148/83 | HR 84 | Ht 62.0 in | Wt 200.5 lb

## 2021-08-26 DIAGNOSIS — G4733 Obstructive sleep apnea (adult) (pediatric): Secondary | ICD-10-CM | POA: Diagnosis not present

## 2021-08-26 DIAGNOSIS — Z9989 Dependence on other enabling machines and devices: Secondary | ICD-10-CM | POA: Diagnosis not present

## 2021-08-27 ENCOUNTER — Encounter: Payer: Self-pay | Admitting: Physical Therapy

## 2021-08-27 ENCOUNTER — Ambulatory Visit (INDEPENDENT_AMBULATORY_CARE_PROVIDER_SITE_OTHER): Payer: Commercial Managed Care - PPO | Admitting: Physical Therapy

## 2021-08-27 DIAGNOSIS — M5459 Other low back pain: Secondary | ICD-10-CM

## 2021-08-27 DIAGNOSIS — R293 Abnormal posture: Secondary | ICD-10-CM

## 2021-08-27 DIAGNOSIS — M6281 Muscle weakness (generalized): Secondary | ICD-10-CM | POA: Diagnosis not present

## 2021-08-27 DIAGNOSIS — R29898 Other symptoms and signs involving the musculoskeletal system: Secondary | ICD-10-CM

## 2021-08-27 NOTE — Telephone Encounter (Signed)
Lets get her set up for a DonJoy OA reaction brace

## 2021-08-27 NOTE — Therapy (Signed)
OUTPATIENT PHYSICAL THERAPY TREATMENT NOTE DISCHARGE SUMMARY   Patient Name: Amy Hooper MRN: [id number] DOB:14-Feb-1951, 71 y.o., female Today's Date: 08/27/2021  PCP: Gretchen Portela, MD REFERRING PROVIDER: [name], MD  END OF SESSION:   PT End of Session - 08/27/21 1520     Visit Number 5    Number of Visits --    Date for PT Re-Evaluation --    PT Start Time 1516    PT Stop Time 6761    PT Time Calculation (min) 27 min    Activity Tolerance Patient tolerated treatment well;No increased pain    Behavior During Therapy WFL for tasks assessed/performed                Past Medical History:  Diagnosis Date   Chronic kidney disease    Depression    Fibromyalgia    Gout    HTN (hypertension)    Insomnia    Morbid obesity ([facility])    Osteoarthritis    Palpitation    Sleep apnea    Past Surgical History:  Procedure Laterality Date   APPLICATION OF WOUND VAC N/A 03/25/2021   Procedure: APPLICATION OF WOUND VAC;  Surgeon: [name], MD;  Location: [facility];  Service: Orthopedics;  Laterality: N/A;   APPLICATION OF WOUND VAC N/A 03/30/2021   Procedure: WOUND VAC CHANGE 12x6x5;  Surgeon: [name], MD;  Location: [facility];  Service: Orthopedics;  Laterality: N/A;   INCISION AND DRAINAGE OF WOUND N/A 03/25/2021   Procedure: LUMBAR POST OP INCISION IRRIGATION;  Surgeon: [name], MD;  Location: [facility];  Service: Orthopedics;  Laterality: N/A;   JOINT REPLACEMENT     KNEE ARTHROSCOPY     LUMBAR WOUND DEBRIDEMENT N/A 03/30/2021   Procedure: REPEAT LUMBAR WOUND DEBRIDEMENT;  Surgeon: [name], MD;  Location: [facility];  Service: Orthopedics;  Laterality: N/A;   right rotator cuff     ROTATOR CUFF REPAIR Left    TOTAL KNEE ARTHROPLASTY Left 09/08/2020   Procedure: LEFT TOTAL KNEE ARTHROPLASTY;  Surgeon: Leandrew Koyanagi, MD;  Location: [facility];  Service: Orthopedics;  Laterality: Left;   TUBAL LIGATION     Patient Active Problem List   Diagnosis Date  Noted   Acute on chronic renal insufficiency 03/27/2021   Postoperative complication of skin involving drainage from surgical wound 03/25/2021   Post op infection 03/24/2021   S/P lumbar fusion 03/24/2021   Lumbar stenosis 03/09/2021   Degenerative spondylolisthesis 02/19/2021   Primary osteoarthritis of left knee 09/08/2020   DJD (degenerative joint disease) of knee 09/08/2020   Status post total left knee replacement 09/08/2020   Spondylosis without myelopathy or radiculopathy, lumbar region 07/17/2020   Chronic pain syndrome 07/17/2020   Myalgia 07/17/2020   OSA (obstructive sleep apnea) 05/19/2020   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/17/2020   Sleep disturbance 04/17/2020   Chronic pain of both shoulders 03/03/2020   Pain in both hands 03/03/2020   Primary osteoarthritis of both knees 03/03/2020   Pain in both feet 03/03/2020   History of chronic kidney disease 03/03/2020   DDD (degenerative disc disease), lumbar 02/05/2020   Vitamin D deficiency 01/09/2020   Hyperlipidemia 01/09/2020   CKD (chronic kidney disease) stage 3, GFR 30-59 ml/min ([facility]) 01/09/2020   HTN (hypertension)    Morbid obesity ([facility])    Gout    Fibromyalgia    Depression    Insomnia     REFERRING DIAG: Z98.1 (  ICD-10-CM) - S/P lumbar fusion L76.82 (ICD-10-CM) - Postoperative complication of skin involving drainage from surgical wound   THERAPY DIAG:  Other low back pain  Muscle weakness (generalized)  Abnormal posture  Other symptoms and signs involving the musculoskeletal system  PERTINENT HISTORY: Fibromyalgia, Lt TKA, Rt knee OA, depression, gout, Lt RTC repair  PRECAUTIONS: Back and Other: on oral antibiotics for 1 year  SUBJECTIVE: finally was able to get pain medications sorted out and is feeling much better  PAIN:  Are you having pain? No   OBJECTIVE: (objective measures completed at initial evaluation unless otherwise dated)    COGNITION:           Overall cognitive  status: Within functional limits for tasks assessed                          SENSATION: 07/29/21: numbness and tingling in Rt foot post-op   POSTURE:  Rounded shoulders   SKIN INTEGRITY: Healing wound noted on lumbar spine, no obvious signs of infection at this time   LUMBAR ROM: deferred today   LE MMT:   MMT Right 07/29/2021 Left 07/29/2021  Hip flexion 3/5 3/5  Hip extension      Hip abduction 4/5 4/5  Hip adduction 4/5 4/5  Hip internal rotation      Hip external rotation      Knee flexion 3/5 5/5  Knee extension 3/5 5/5  Ankle dorsiflexion 4/5 5/5   (Blank rows = not tested)   FUNCTIONAL TESTS:  Eval: 5 times sit to stand: 28.1 sec without UE strength 3 minute walk test: 266' with SPC, RPE1-2/10   08/27/21:   5x STS: 16.75 sec 3 minute walk test: 26' with SPC, RPE1-2/10   GAIT: Distance walked: see above Assistive device utilized: Single point cane Level of assistance: Modified independence Comments: decreased RLE clearance noted       TODAY'S TREATMENT  08/27/21 Therex: NuStep L5 x 10 min Discussed current HEP and options to progress as well as variety of exercises to perform  Physical Performance Test: See objective measures above; 5x STS and 3' walk test performed and reviewed results with pt  08/19/21 Therex:      Aerobic: NuStep L5 x 10 min     Sitting: LAQ 4# 2x10 bil (decr weight due to incr pain) Sit to/from stand x 10 reps without UE support Ball squeeze (hip adduction) 2x10 reps Hip abduction L3 band 2x10     Standing: Heel raises 2x10 Hip abduction 2 x 10 reps bil Hip extension 2 x 10 reps bil   08/12/21 Therex:      Sitting: LAQ 5# 2x10 bil Sit to/from stand x10 with 2# ball overhead reach     Standing: Rows L3 band 3x10 Extension L3 band 3x10 Calf raises x 20 reps Hip abduction 2x10 bil with L4 band Hip ext 2x10 bil with L4 band     Aerobic: NuStep L7 x 8 min   08/05/2021: Therapeutic Exercises: Trunk extension AROM (hands  low to get L5-S1) limited range 10X 3 seconds Shoulder blade pinches 10X 5 seconds Standing marching with good posture 20X Standing hip abduction with good posture 20X Standing hip extension with good posture 20X Heel to toe raises with transversus abdominus activation 20X Sit to stand high seat 5X slow eccentrics (maintain lumbar lordosis)  Therapeutic Activities: Reviewed spine anatomy with spine model, practical kitchen mechanics (get in and out of cabinets), log roll, golfers and  diagonal lifts   07/29/21: Discussed walking program and how to work on endurance at home, reviewed HEP and demonstrated exercises with pt - pt verbalized understanding.     PATIENT EDUCATION:  Education details: HEP        Person educated: Patient Education method: Consulting civil engineer, Media planner, and Handouts Education comprehension: verbalized understanding     HOME EXERCISE PROGRAM: Access Code: [email or web address] URL: [email or web address] Date: 08/05/2021 Prepared by: Vista Mink  Exercises - Sit to Stand  - 1 x daily - 7 x weekly - 1 sets - 10 reps - Standing Hip Abduction with Counter Support  - 1 x daily - 7 x weekly - 1 sets - 20 reps - Standing Hip Extension with Counter Support  - 1 x daily - 7 x weekly - 1 sets - 20 reps - Standing March with Counter Support  - 1 x daily - 7 x weekly - 1 sets - 20 reps - Heel Raises with Counter Support  - 1 x daily - 7 x weekly - 1 sets - 20 reps - Standing Scapular Retraction  - 5 x daily - 7 x weekly - 1 sets - 5 reps - 5 second hold - Standing Lumbar Extension at Wall - Forearms  - 5 x daily - 7 x weekly - 1 sets - 5 reps - 3 seconds hold  Patient Education - walking program    ASSESSMENT:   CLINICAL IMPRESSION:  Pt has met all goals and is ready for d/c from PT at this time.  Recommended continued HEP as well as general endurance and strengthening program.  Pt verbalized understanding.  OBJECTIVE IMPAIRMENTS Abnormal gait, decreased activity  tolerance, decreased balance, decreased endurance, decreased knowledge of use of DME, decreased mobility, difficulty walking, decreased strength, impaired flexibility, postural dysfunction, and pain.    ACTIVITY LIMITATIONS cleaning, community activity, meal prep, yard work, and shopping.    PERSONAL FACTORS Past/current experiences, Time since onset of injury/illness/exacerbation, and 3+ comorbidities: Fibromyalgia, Lt TKA, Rt knee OA, depression, gout, Lt RTC repair  are also affecting patient's functional outcome.      REHAB POTENTIAL: Good   CLINICAL DECISION MAKING: Unstable/unpredictable   EVALUATION COMPLEXITY: High     GOALS: Goals reviewed with patient? Yes   SHORT TERM GOALS: Target date: 08/19/2021   Independent with initial HEP Goal status: MET 08/27/21   2.  5x STS < 22 sec for improved functional strength Goal status: MET 08/27/21   LONG TERM GOALS: Target date: 09/09/2021   Independent with final HEP Goal status: MET 08/27/21   2.  FOTO score improved to (will update once complete) Goal status: Deferred; did not capture 08/27/21   3.  5x STS improved to < 19 sec for improved functional strength Goal status: MET 08/27/21   4.  Report pain < 5/10 with standing and walking for improved function Goal status: MET 08/27/21   5.  Improve 3MWT to at least 320' for improved endurance and mobility Goal status: MET 08/27/21     PLAN: PT FREQUENCY: 1x/week   PT DURATION: 6 weeks   PLANNED INTERVENTIONS: Therapeutic exercises, Therapeutic activity, Neuromuscular re-education, Balance training, Gait training, Patient/Family education, Joint mobilization, Stair training, DME instructions, Electrical stimulation, Spinal mobilization, Cryotherapy, Moist heat, Taping, and Manual therapy.   PLAN FOR NEXT SESSION: d/c PT today         Laureen Abrahams, PT, DPT 08/27/21 3:50 PM      PHYSICAL THERAPY DISCHARGE SUMMARY  Visits from Start of  Care: 5  Current functional  level related to goals / functional outcomes: See above   Remaining deficits: See above   Education / Equipment: HEP, walking program   Patient agrees to discharge. Patient goals were met. Patient is being discharged due to meeting the stated rehab goals.  Laureen Abrahams, PT, DPT 08/27/21 3:50 PM  [facility] [address] [place], [place], [address] Phone: [phone]   Fax:  [phone]

## 2021-08-31 ENCOUNTER — Encounter: Payer: Commercial Managed Care - PPO | Admitting: Physical Therapy

## 2021-08-31 ENCOUNTER — Other Ambulatory Visit: Payer: Self-pay | Admitting: Physical Medicine and Rehabilitation

## 2021-09-01 ENCOUNTER — Telehealth: Payer: Self-pay | Admitting: Family Medicine

## 2021-09-01 NOTE — Telephone Encounter (Signed)
[facility] pendng faxed notes

## 2021-09-02 ENCOUNTER — Other Ambulatory Visit: Payer: Self-pay | Admitting: Physical Medicine and Rehabilitation

## 2021-09-02 ENCOUNTER — Encounter: Payer: Self-pay | Admitting: Registered Nurse

## 2021-09-02 ENCOUNTER — Encounter: Payer: Commercial Managed Care - PPO | Attending: Physical Medicine and Rehabilitation | Admitting: Registered Nurse

## 2021-09-02 VITALS — BP 143/85 | HR 70 | Ht 62.0 in | Wt 200.2 lb

## 2021-09-02 DIAGNOSIS — G894 Chronic pain syndrome: Secondary | ICD-10-CM

## 2021-09-02 DIAGNOSIS — M1711 Unilateral primary osteoarthritis, right knee: Secondary | ICD-10-CM | POA: Diagnosis present

## 2021-09-02 DIAGNOSIS — F4321 Adjustment disorder with depressed mood: Secondary | ICD-10-CM | POA: Diagnosis present

## 2021-09-02 DIAGNOSIS — G8929 Other chronic pain: Secondary | ICD-10-CM | POA: Diagnosis present

## 2021-09-02 DIAGNOSIS — M797 Fibromyalgia: Secondary | ICD-10-CM

## 2021-09-02 DIAGNOSIS — T8140XD Infection following a procedure, unspecified, subsequent encounter: Secondary | ICD-10-CM | POA: Diagnosis present

## 2021-09-02 DIAGNOSIS — Z5181 Encounter for therapeutic drug level monitoring: Secondary | ICD-10-CM | POA: Diagnosis present

## 2021-09-02 DIAGNOSIS — Z981 Arthrodesis status: Secondary | ICD-10-CM | POA: Diagnosis present

## 2021-09-02 DIAGNOSIS — M545 Low back pain, unspecified: Secondary | ICD-10-CM | POA: Insufficient documentation

## 2021-09-02 DIAGNOSIS — Z79891 Long term (current) use of opiate analgesic: Secondary | ICD-10-CM | POA: Diagnosis present

## 2021-09-02 MED ORDER — HYDROCODONE-ACETAMINOPHEN 10-325 MG PO TABS: 1.0000 | ORAL_TABLET | Freq: Two times a day (BID) | ORAL | 0 refills | Status: DC | PRN

## 2021-09-02 NOTE — Progress Notes (Signed)
Subjective:    Patient ID: Amy Hooper, female    DOB: 1950/11/30, 71 y.o.   MRN: [id number]  HPI: Amy Hooper is a 71 y.o. female who returns for follow up appointment for chronic pain and medication refill. She states her pain is located in her lower back and right knee pain. She also reports she has Fibro pain. She rates her pain 9. Her current exercise regime is walking and performing stretching exercises.  Amy Hooper underwent: on 03/09/2021: Dr Lorin Mercy  RIGHT LUMBAR FOUR-FIVE TRANSFORAMINAL LUMBAR INTERBODY FUSION, PEDICLE INSTRUMENTATION, CAGE, LATERAL FUSION, RIGHT LUMBAR FIVE-SACRAL ONE MICRODISCECTOMY and REPAIR OF DURA  Amy Hooper underwent: on 03/25/2021: Dr Lorin Mercy   LUMBAR POST OP INCISION IRRIGATION N/A General  APPLICATION OF WOUND VAC    Amy Hooper underwent  on 03/30/2021: Dr Lorin Mercy       Procedure Laterality Anesthesia  REPEAT LUMBAR WOUND DEBRIDEMENT N/A General  WOUND VAC CHANGE 12x6x5       Amy Hooper Lumbar Surgical Wound: ID Following: Continue Antibiotics: Dressing Intact.   Pain Inventory Average Pain 9 Pain Right Now 9 My pain is sharp and stabbing  In the last 24 hours, has pain interfered with the following? General activity 8 Relation with others 8 Enjoyment of life 8 What TIME of day is your pain at its worst? morning , daytime, evening, and night Sleep (in general) Fair  Pain is worse with: walking, bending, sitting, standing, and some activites Pain improves with: medication and injections Relief from Meds: 8  Family History  Problem Relation Age of Onset   Hypothyroidism Mother    Hyperlipidemia Other    Kidney failure Father    Dementia Father    Gout Father    Arthritis Father    Prostate cancer Father    Social History   Socioeconomic History   Marital status: Married    Spouse name: Not on file   Number of children: 3   Years of education: Not on file   Highest education level: Bachelor's degree (e.g., BA, AB,  BS)  Occupational History   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     Social Determinants of Health   Financial Resource Strain: Low Risk  (03/01/2021)   Overall Financial Resource Strain (CARDIA)    Difficulty of Paying Living Expenses: Not hard at all  Food Insecurity: No Food Insecurity (03/01/2021)   Hunger Vital Sign    Worried About Running Out of Food in the Last Year: Never true    Ran Out of Food in the Last Year: Never true  Transportation Needs: No Transportation Needs (03/01/2021)   PRAPARE - Hydrologist (Medical): No    Lack of Transportation (Non-Medical): No  Physical Activity: Unknown (03/01/2021)   Exercise Vital Sign    Days of Exercise per Week: 0 days    Minutes of Exercise per Session: Not on file  Stress: No Stress Concern Present (03/01/2021)   Melissa    Feeling of Stress : Only a little  Social Connections: Moderately  Isolated (03/01/2021)   Social Connection and Isolation Panel [NHANES]    Frequency of Communication with Friends and Family: More than three times a week    Frequency of Social Gatherings with Friends and Family: Once a week    Attends Religious Services: Never    Marine scientist or Organizations: No    Attends Music therapist: Not on file    Marital Status: Married   Past Surgical History:  Procedure Laterality Date   APPLICATION OF WOUND VAC N/A 03/25/2021   Procedure: APPLICATION OF WOUND VAC;  Surgeon: [name], MD;  Location: [place];  Service: Orthopedics;  Laterality: N/A;   APPLICATION OF WOUND  VAC N/A 03/30/2021   Procedure: WOUND VAC CHANGE 12x6x5;  Surgeon: [name], MD;  Location: [place];  Service: Orthopedics;  Laterality: N/A;   INCISION AND DRAINAGE OF WOUND N/A 03/25/2021   Procedure: LUMBAR POST OP INCISION IRRIGATION;  Surgeon: [name], MD;  Location: [place];  Service: Orthopedics;  Laterality: N/A;   JOINT REPLACEMENT     KNEE ARTHROSCOPY     LUMBAR WOUND DEBRIDEMENT N/A 03/30/2021   Procedure: REPEAT LUMBAR WOUND DEBRIDEMENT;  Surgeon: [name], MD;  Location: [place];  Service: Orthopedics;  Laterality: N/A;   right rotator cuff     ROTATOR CUFF REPAIR Left    TOTAL KNEE ARTHROPLASTY Left 09/08/2020   Procedure: LEFT TOTAL KNEE ARTHROPLASTY;  Surgeon: Leandrew Koyanagi, MD;  Location: [place];  Service: Orthopedics;  Laterality: Left;   TUBAL LIGATION     Past Surgical History:  Procedure Laterality Date   APPLICATION OF WOUND VAC N/A 03/25/2021   Procedure: APPLICATION OF WOUND VAC;  Surgeon: [name], MD;  Location: [place];  Service: Orthopedics;  Laterality: N/A;   APPLICATION OF WOUND VAC N/A 03/30/2021   Procedure: WOUND VAC CHANGE 12x6x5;  Surgeon: [name], MD;  Location: [place];  Service: Orthopedics;  Laterality: N/A;   INCISION AND DRAINAGE OF WOUND N/A 03/25/2021   Procedure: LUMBAR POST OP INCISION IRRIGATION;  Surgeon: [name], MD;  Location: [place];  Service: Orthopedics;  Laterality: N/A;   JOINT REPLACEMENT     KNEE ARTHROSCOPY     LUMBAR WOUND DEBRIDEMENT N/A 03/30/2021   Procedure: REPEAT LUMBAR WOUND DEBRIDEMENT;  Surgeon: [name], MD;  Location: [place];  Service: Orthopedics;  Laterality: N/A;   right rotator cuff     ROTATOR CUFF REPAIR Left    TOTAL KNEE ARTHROPLASTY Left 09/08/2020   Procedure: LEFT TOTAL KNEE ARTHROPLASTY;  Surgeon: Leandrew Koyanagi, MD;  Location: [place];  Service: Orthopedics;  Laterality: Left;   TUBAL LIGATION     Past Medical History:  Diagnosis Date   Chronic kidney disease    Depression    Fibromyalgia     Gout    HTN (hypertension)    Insomnia    Morbid obesity (HCC)    Osteoarthritis    Palpitation    Sleep apnea    BP (!) 143/85   Pulse 70   Ht '5\' 2"'$  (1.575 m)   Wt 200 lb 3.2 oz (90.8 kg)   SpO2 97%   BMI 36.62 kg/m   Opioid Risk Score:   Fall Risk Score:  `1  Depression screen PHQ 2/9     09/02/2021    2:06 PM 08/06/2021   11:25 AM 06/11/2021   11:18 AM 05/19/2021   10:55 AM 04/14/2021  10:24 AM 03/05/2021    7:52 AM 02/06/2021   10:40 AM  Depression screen PHQ 2/9  Decreased Interest 1 2 0 1 0 0 0  Down, Depressed, Hopeless 1 2 0 3 0 0 0  PHQ - 2 Score 2 4 0 4 0 0 0  Altered sleeping    3  0   Tired, decreased energy    2  0   Change in appetite    2  0   Feeling bad or failure about yourself     0  0   Trouble concentrating    0  0   Moving slowly or fidgety/restless    0  0   Suicidal thoughts    0  0   PHQ-9 Score    11  0   Difficult doing work/chores      Not difficult at all      Review of Systems  Constitutional: Negative.   HENT: Negative.    Eyes: Negative.   Respiratory: Negative.    Cardiovascular: Negative.   Gastrointestinal: Negative.   Endocrine: Negative.   Genitourinary: Negative.   Musculoskeletal:  Positive for back pain.  Skin: Negative.   Allergic/Immunologic: Negative.   Neurological: Negative.   Hematological: Negative.   Psychiatric/Behavioral: Negative.        Objective:   Physical Exam        Assessment & Plan:  1, Chronic Bilateral Low Back Pain: S/P Lumbar Fusion: Dr Lorin Mercy Following. Continue HEP as Tolerated. Continue to Monitor.  2. Post Operative Infection: ID Following: Continue Antibiotics. Continue to Monitor.  3. Fibromyalgia: Continue HEP as Tolerated. Continue Lyrica. Continue to Monitor.  4. Primary Osteoarthritis: Left Knee: S/P on 09/08/20: Dr Erlinda Hong: LEFT TOTAL KNEE ARTHROPLASTY. Continue HEP as Tolerated. Continue to Monitor.  5. Primary Osteoarthritis Right Knee: Continue HEP as Tolerated. Ortho  Following. Continue to Monitor.  6. Chronic Pain Syndrome: Continue Amitriptyline  RX: Hydrocodone 10 mg/325 mg one tablet twice a day as needed for pain #60. We will continue the opioid monitoring program, this consists of regular clinic visits, examinations, urine drug screen, pill counts as well as use of [place] Controlled Substance Reporting system. A 12 month History has been reviewed on the [place] Controlled Substance Reporting System on 09/02/2021 7. Depression: Continue Cymbalta. Continue to Monitor.   F/U in 2 months

## 2021-09-08 ENCOUNTER — Ambulatory Visit: Payer: Commercial Managed Care - PPO | Admitting: Orthopaedic Surgery

## 2021-09-08 NOTE — Telephone Encounter (Signed)
HST- UMR No auth req spoke to [place] ref # [id number]. Patient I scheduled for 09/29/21 at 9 AM.

## 2021-09-10 ENCOUNTER — Telehealth: Payer: Self-pay | Admitting: Registered Nurse

## 2021-09-10 MED ORDER — HYDROCODONE-ACETAMINOPHEN 7.5-325 MG PO TABS: 1.0000 | ORAL_TABLET | Freq: Two times a day (BID) | ORAL | 0 refills | Status: DC | PRN

## 2021-09-10 NOTE — Telephone Encounter (Signed)
PMP was Reviewed Hydrocodone 7.5 mg e-scribed today Ms. Riebe is awre of the above via My-Chart

## 2021-09-29 ENCOUNTER — Ambulatory Visit (INDEPENDENT_AMBULATORY_CARE_PROVIDER_SITE_OTHER): Payer: Commercial Managed Care - PPO | Admitting: Neurology

## 2021-09-29 DIAGNOSIS — G4733 Obstructive sleep apnea (adult) (pediatric): Secondary | ICD-10-CM | POA: Diagnosis not present

## 2021-10-01 NOTE — Progress Notes (Signed)
See procedure note.

## 2021-10-01 NOTE — Procedures (Signed)
   [facility]  HOME SLEEP TEST (Watch PAT) REPORT  STUDY DATE: 09/29/2021  DOB: 01/13/1951  MRN: [id number]  ORDERING CLINICIAN: Star Age, MD, PhD   REFERRING CLINICIAN: Debbora Presto, NP  CLINICAL INFORMATION/HISTORY:  71 year old right-handed woman with an underlying medical history of osteoarthritis with status post bilateral knee arthroscopic surgeries, bilateral shoulder arthroscopic surgeries, lumbar spine surgery in December 2022, hypertension, gout, depression, fibromyalgia, chronic kidney disease, palpitations and obesity, who presents for reevaluation of her obstructive sleep apnea.  She has not been using her AutoPap consistently.  She has had some weight fluctuation.  Epworth sleepiness score: 14/24.  BMI: 35.9 kg/m  FINDINGS:   Sleep Summary:   Total Recording Time (hours, min): 7 hours, 43 min  Total Sleep Time (hours, min):  7 hours, 11 min  Percent REM (%):    20.2%   Respiratory Indices:   Calculated pAHI (per hour):  28.4/hour         REM pAHI:    36.8/hour       NREM pAHI: 26.3/hour  Central pAHI: 2.1/hour  Oxygen Saturation Statistics:    Oxygen Saturation (%) Mean: 93%   Minimum oxygen saturation (%):                 79%   O2 Saturation Range (%): 79-98%    O2 Saturation (minutes) <=88%: 4.2 min  Pulse Rate Statistics:   Pulse Mean (bpm):    69/min    Pulse Range (53-104/min)   IMPRESSION: OSA (obstructive sleep apnea)   RECOMMENDATION:  This home sleep test demonstrates moderate obstructive sleep apnea with a total AHI of 28.4/hour and O2 nadir of 79%.  Intermittent mild to moderate snoring was detected.  Ongoing treatment with a positive airway pressure (PAP) device is recommended. The patient will be advised to continue with AutoPap therapy for now, pressure of 5-11 cm with mask of choice.  A full night titration study may be considered to optimize treatment settings, if needed down the road.  Alternative treatment  options may include a dental device through dentistry or orthodontics in selected patients or Inspire (hypoglossal nerve stimulator) in carefully selected patients (meeting inclusion criteria). Concomitant/ongoing weight loss is recommended (where clinically appropriate). Please note that untreated obstructive sleep apnea may carry additional perioperative morbidity. Patients with significant obstructive sleep apnea should receive perioperative PAP therapy and the surgeons and particularly the anesthesiologist should be informed of the diagnosis and the severity of the sleep disordered breathing. The patient should be cautioned not to drive, work at heights, or operate dangerous or heavy equipment when tired or sleepy. Review and reiteration of good sleep hygiene measures should be pursued with any patient. Other causes of the patient's symptoms, including circadian rhythm disturbances, an underlying mood disorder, medication effect and/or an underlying medical problem cannot be ruled out based on this test. Clinical correlation is recommended. The patient and his referring provider will be notified of the test results. The patient will be seen in follow up in sleep clinic at [facility].  I certify that I have reviewed the raw data recording prior to the issuance of this report in accordance with the standards of the American Academy of Sleep Medicine (AASM).  INTERPRETING PHYSICIAN:   Star Age, MD, PhD  Board Certified in Neurology and Sleep Medicine  [facility] [address] [place], [place] [address] [phone]

## 2021-10-06 ENCOUNTER — Encounter: Payer: Self-pay | Admitting: Orthopaedic Surgery

## 2021-10-14 ENCOUNTER — Encounter: Payer: Self-pay | Admitting: Internal Medicine

## 2021-10-14 ENCOUNTER — Other Ambulatory Visit: Payer: Self-pay

## 2021-10-14 ENCOUNTER — Other Ambulatory Visit: Payer: Self-pay | Admitting: Internal Medicine

## 2021-10-14 ENCOUNTER — Other Ambulatory Visit: Payer: Self-pay | Admitting: *Deleted

## 2021-10-14 DIAGNOSIS — K649 Unspecified hemorrhoids: Secondary | ICD-10-CM

## 2021-10-14 MED ORDER — PREGABALIN 75 MG PO CAPS: 75.0000 mg | ORAL_CAPSULE | Freq: Three times a day (TID) | ORAL | 3 refills | Status: DC

## 2021-10-14 MED ORDER — ALLOPURINOL 100 MG PO TABS: 200.0000 mg | ORAL_TABLET | Freq: Every day | ORAL | 1 refills | Status: DC

## 2021-10-14 MED ORDER — HYDROCORTISONE (PERIANAL) 2.5 % EX CREA: 1.0000 | TOPICAL_CREAM | Freq: Two times a day (BID) | CUTANEOUS | 3 refills | Status: DC

## 2021-10-14 MED ORDER — AMITRIPTYLINE HCL 10 MG PO TABS: ORAL_TABLET | ORAL | 0 refills | Status: DC

## 2021-10-14 NOTE — Telephone Encounter (Signed)
Last refill 07/28/21 Last office visit 03/05/21

## 2021-10-15 ENCOUNTER — Telehealth: Payer: Self-pay | Admitting: Registered Nurse

## 2021-10-15 MED ORDER — HYDROCODONE-ACETAMINOPHEN 7.5-325 MG PO TABS: 1.0000 | ORAL_TABLET | Freq: Two times a day (BID) | ORAL | 0 refills | Status: DC | PRN

## 2021-10-15 NOTE — Telephone Encounter (Signed)
PMP was Reviewed.  Hydrocodone e-scribed today.  She has a scheduled appointment.

## 2021-10-15 NOTE — Telephone Encounter (Signed)
Okay to refer? 

## 2021-10-20 ENCOUNTER — Other Ambulatory Visit: Payer: Self-pay

## 2021-10-20 NOTE — Telephone Encounter (Signed)
This medication was ordered on 09/02/2021.  90 day supply.  Robbin, do you know why they are requesting a refill?

## 2021-10-21 ENCOUNTER — Telehealth: Payer: Self-pay

## 2021-10-21 DIAGNOSIS — M4626 Osteomyelitis of vertebra, lumbar region: Secondary | ICD-10-CM

## 2021-10-21 MED ORDER — METRONIDAZOLE 500 MG PO TABS: 500.0000 mg | ORAL_TABLET | Freq: Two times a day (BID) | ORAL | 0 refills | Status: DC

## 2021-10-21 MED ORDER — LEVOFLOXACIN 500 MG PO TABS: 500.0000 mg | ORAL_TABLET | Freq: Every day | ORAL | 0 refills | Status: DC

## 2021-10-21 NOTE — Telephone Encounter (Signed)
Received refill request from [facility] for levofloxacin 250 mg PO daily and metronidazole 500 mg PO BID. Will route to provider.   Beryle Flock, RN

## 2021-10-21 NOTE — Addendum Note (Signed)
Addended by: Lucie Leather D on: 10/21/2021 01:34 PM   Modules accepted: Orders

## 2021-10-21 NOTE — Telephone Encounter (Signed)
Renal function appears to be recovered with creatinine clearance of 70. Ok to increase to Levaquin to 500 mg PO daily

## 2021-10-22 ENCOUNTER — Other Ambulatory Visit: Payer: Self-pay | Admitting: Physical Medicine and Rehabilitation

## 2021-10-23 ENCOUNTER — Telehealth: Payer: Self-pay | Admitting: Registered Nurse

## 2021-10-23 MED ORDER — DULOXETINE HCL 20 MG PO CPEP: ORAL_CAPSULE | ORAL | 1 refills | Status: DC

## 2021-10-23 NOTE — Telephone Encounter (Signed)
Cymbalta: sent to pharmacy.  Amy Hooper is aware via My-Chart message

## 2021-10-27 ENCOUNTER — Ambulatory Visit: Payer: Commercial Managed Care - PPO | Admitting: Orthopaedic Surgery

## 2021-10-27 ENCOUNTER — Ambulatory Visit (INDEPENDENT_AMBULATORY_CARE_PROVIDER_SITE_OTHER): Payer: Commercial Managed Care - PPO

## 2021-10-27 DIAGNOSIS — Z4789 Encounter for other orthopedic aftercare: Secondary | ICD-10-CM | POA: Diagnosis not present

## 2021-10-27 DIAGNOSIS — Z981 Arthrodesis status: Secondary | ICD-10-CM | POA: Diagnosis not present

## 2021-10-27 NOTE — Progress Notes (Unsigned)
Office Visit Note   [name]           Date of Birth: 18-Apr-1950           MRN: [id number] Visit Date: 10/27/2021              Requested by: Isaac Bliss, [name], MD [place],  [place] [id number] PCP: Isaac Bliss, [name], MD   Assessment & Plan: Visit Diagnoses: No diagnosis found.  Plan: ***  Follow-Up Instructions: No follow-ups on file.   Orders:  No orders of the defined types were placed in this encounter.  No orders of the defined types were placed in this encounter.     Procedures: No procedures performed   Clinical Data: No additional findings.   Subjective: Chief Complaint  Patient presents with   Lower Back - Follow-up    HPI  Review of Systems   Objective: Vital Signs: There were no vitals taken for this visit.  Physical Exam  Ortho Exam  Specialty Comments:  No specialty comments available.  Imaging: No results found.   PMFS History: Patient Active Problem List   Diagnosis Date Noted   Acute on chronic renal insufficiency 03/27/2021   Postoperative complication of skin involving drainage from surgical wound 03/25/2021   Post op infection 03/24/2021   S/P lumbar fusion 03/24/2021   Lumbar stenosis 03/09/2021   Degenerative spondylolisthesis 02/19/2021   Primary osteoarthritis of left knee 09/08/2020   DJD (degenerative joint disease) of knee 09/08/2020   Status post total left knee replacement 09/08/2020   Spondylosis without myelopathy or radiculopathy, lumbar region 07/17/2020   Chronic pain syndrome 07/17/2020   Myalgia 07/17/2020   OSA (obstructive sleep apnea) 05/19/2020   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/17/2020   Sleep disturbance 04/17/2020   Chronic pain of both shoulders 03/03/2020   Pain in both hands 03/03/2020   Primary osteoarthritis of both knees 03/03/2020   Pain in both feet 03/03/2020   History of chronic kidney disease 03/03/2020   DDD  (degenerative disc disease), lumbar 02/05/2020   Vitamin D deficiency 01/09/2020   Hyperlipidemia 01/09/2020   CKD (chronic kidney disease) stage 3, GFR 30-59 ml/min ([place]) 01/09/2020   HTN (hypertension)    Morbid obesity ([place])    Gout    Fibromyalgia    Depression    Insomnia    Past Medical History:  Diagnosis Date   Chronic kidney disease    Depression    Fibromyalgia    Gout    HTN (hypertension)    Insomnia    Morbid obesity ([place])    Osteoarthritis    Palpitation    Sleep apnea     Family History  Problem Relation Age of Onset   Hypothyroidism Mother    Hyperlipidemia Other    Kidney failure Father    Dementia Father    Gout Father    Arthritis Father    Prostate cancer Father     Past Surgical History:  Procedure Laterality Date   APPLICATION OF WOUND VAC N/A 03/25/2021   Procedure: APPLICATION OF WOUND VAC;  Surgeon: [name], MD;  Location: [place];  Service: Orthopedics;  Laterality: N/A;   APPLICATION OF WOUND VAC N/A 03/30/2021   Procedure: WOUND VAC CHANGE 12x6x5;  Surgeon: [name], MD;  Location: [place];  Service: Orthopedics;  Laterality: N/A;   INCISION AND DRAINAGE OF WOUND N/A 03/25/2021   Procedure: LUMBAR POST OP INCISION IRRIGATION;  Surgeon: [name], MD;  Location: [place];  Service: Orthopedics;  Laterality: N/A;   JOINT REPLACEMENT     KNEE ARTHROSCOPY     LUMBAR WOUND DEBRIDEMENT N/A 03/30/2021   Procedure: REPEAT LUMBAR WOUND DEBRIDEMENT;  Surgeon: [name], MD;  Location: [place];  Service: Orthopedics;  Laterality: N/A;   right rotator cuff     ROTATOR CUFF REPAIR Left    TOTAL KNEE ARTHROPLASTY Left 09/08/2020   Procedure: LEFT TOTAL KNEE ARTHROPLASTY;  Surgeon: Leandrew Koyanagi, MD;  Location: [place];  Service: Orthopedics;  Laterality: Left;   TUBAL LIGATION     Social History   Occupational History   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

## 2021-10-29 ENCOUNTER — Other Ambulatory Visit: Payer: Self-pay | Admitting: Internal Medicine

## 2021-10-29 DIAGNOSIS — I10 Essential (primary) hypertension: Secondary | ICD-10-CM

## 2021-11-01 ENCOUNTER — Encounter: Payer: Self-pay | Admitting: Rheumatology

## 2021-11-02 NOTE — Telephone Encounter (Signed)
Patient has not been seen in the office since 03/02/21.  She will need an appointment with Dr. Estanislado Pandy for further evaluation.

## 2021-11-03 ENCOUNTER — Other Ambulatory Visit: Payer: Self-pay

## 2021-11-03 ENCOUNTER — Encounter: Payer: Self-pay | Admitting: Internal Medicine

## 2021-11-03 ENCOUNTER — Ambulatory Visit: Payer: Commercial Managed Care - PPO | Admitting: Internal Medicine

## 2021-11-03 VITALS — BP 102/66 | HR 60 | Temp 98.2°F | Resp 16 | Ht 62.0 in | Wt 199.6 lb

## 2021-11-03 DIAGNOSIS — T8149XA Infection following a procedure, other surgical site, initial encounter: Secondary | ICD-10-CM

## 2021-11-03 MED ORDER — AMOXICILLIN-POT CLAVULANATE 500-125 MG PO TABS: 500.0000 mg | ORAL_TABLET | Freq: Two times a day (BID) | ORAL | 5 refills | Status: DC

## 2021-11-03 NOTE — Progress Notes (Unsigned)
Patient Active Problem List   Diagnosis Date Noted   Acute on chronic renal insufficiency 03/27/2021   Postoperative complication of skin involving drainage from surgical wound 03/25/2021   Post op infection 03/24/2021   S/P lumbar fusion 03/24/2021   Lumbar stenosis 03/09/2021   Degenerative spondylolisthesis 02/19/2021   Primary osteoarthritis of left knee 09/08/2020   DJD (degenerative joint disease) of knee 09/08/2020   Status post total left knee replacement 09/08/2020   Spondylosis without myelopathy or radiculopathy, lumbar region 07/17/2020   Chronic pain syndrome 07/17/2020   Myalgia 07/17/2020   OSA (obstructive sleep apnea) 05/19/2020   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/17/2020   Sleep disturbance 04/17/2020   Chronic pain of both shoulders 03/03/2020   Pain in both hands 03/03/2020   Primary osteoarthritis of both knees 03/03/2020   Pain in both feet 03/03/2020   History of chronic kidney disease 03/03/2020   DDD (degenerative disc disease), lumbar 02/05/2020   Vitamin D deficiency 01/09/2020   Hyperlipidemia 01/09/2020   CKD (chronic kidney disease) stage 3, GFR 30-59 ml/min ([place]) 01/09/2020   HTN (hypertension)    Morbid obesity (HCC)    Gout    Fibromyalgia    Depression    Insomnia     Patient's Medications  New Prescriptions   No medications on file  Previous Medications   ALLOPURINOL (ZYLOPRIM) 100 MG TABLET    Take 2 tablets (200 mg total) by mouth daily.   AMITRIPTYLINE (ELAVIL) 10 MG TABLET    TAKE 1 TABLET BY MOUTH EVERYDAY AT BEDTIME   AMLODIPINE (NORVASC) 5 MG TABLET    TAKE 1 TABLET (5 MG TOTAL) BY MOUTH DAILY.   ATORVASTATIN (LIPITOR) 10 MG TABLET    TAKE 1 TABLET BY MOUTH EVERYDAY AT BEDTIME   DULOXETINE (CYMBALTA) 20 MG CAPSULE    TAKE 1 CAPSULE BY MOUTH EVERY DAY   ESZOPICLONE (LUNESTA) 2 MG TABS TABLET    TAKE 1 TABLET BY MOUTH AT NIGHT IMMEDIATELY BEFORE BEDTIME   FLUTICASONE (FLONASE) 50 MCG/ACT NASAL SPRAY     Place 1 spray into both nostrils daily as needed for allergies.   HYDROCODONE-ACETAMINOPHEN (NORCO) 7.5-325 MG TABLET    Take 1 tablet by mouth 2 (two) times daily as needed for moderate pain.   HYDROCORTISONE (ANUSOL-HC) 2.5 % RECTAL CREAM    Place 1 Application rectally 2 (two) times daily.   HYDROXYCHLOROQUINE (PLAQUENIL) 200 MG TABLET    Take 1 tablet (200 mg total) by mouth daily.   HYDROXYZINE (VISTARIL) 100 MG CAPSULE    Take 1 capsule (100 mg total) by mouth 3 (three) times daily as needed for itching.   LEVOFLOXACIN (LEVAQUIN) 500 MG TABLET    Take 1 tablet (500 mg total) by mouth daily.   LIDOCAINE (LIDODERM) 5 %    Place 1 patch onto the skin daily. Remove & Discard patch within 12 hours or as directed by MD   LORATADINE (CLARITIN) 10 MG TABLET    Take 10 mg by mouth daily as needed for allergies.   LOSARTAN (COZAAR) 50 MG TABLET    TAKE 1 TABLET BY MOUTH EVERY DAY   METRONIDAZOLE (FLAGYL) 500 MG TABLET    Take 1 tablet (500 mg total) by mouth 2 (two) times daily.   PREGABALIN (LYRICA) 75 MG CAPSULE    Take 1 capsule (75 mg total) by mouth 3 (three) times daily.   VITAMIN D, ERGOCALCIFEROL, (DRISDOL) 1.25 MG (50000 UNIT) CAPS  CAPSULE    TAKE 1 CAPSULE (50,000 UNITS TOTAL) BY MOUTH EVERY 7 (SEVEN) DAYS  Modified Medications   No medications on file  Discontinued Medications   No medications on file    Subjective: 71-year-old female with obesity, CKD, L4-L5 stenosis anterolisthesis status post L4-5 transforaminal lumbar interbody fusion with pedicle instrumentation cage lateral fusion right L5-S1 microdiscectomy on 03/09/2021.  Patient developed drainage from surgical site infection hospitalized underwent I&D(03/25/21) of lumbar incision.  OR cultures positive for Enterobacter aeruginosa, Morganella and Bacteroides fragilis.  She underwent repeat debridement in the OR due to purulent drainage on 03/30/21. Patient was initially on cefazolin transitioned to cefpime then metronidazole added.   Plan was to complete 2 weeks of IV therapy with cefepime and metronidazole (04/14/21) then transition to levaquin and metro to complete 8 weeks of antibiotics from OR on 03/30/21. She does have retained hardware as suuch will need suppressive antibiotics with metro and levaquin.  Interval 1/24: Changed to levaquin and metronidazole 05/19/21:Wound vac in place. Followed by Dr. Ames Coupe). Pt reports tolerating levaquin and metronidazole.  06/11/21: no new complaints 07/30/21: Presents  due to GI symptoms. Pt has loss of appetitie, loose stools 5-6 times/day. Loose stools started  in the beginning of April. Pt has been taking percocet PRN less often since April. She notices stools are more formed with percocet. Denies, fever, chills, N,V. Pt had been on probiotics, but has not been taking them since mid April.  She is on levaquin 25 mg PO qd for chronic suppression. Seen by Dr. Lorin Mercy on 4/26 and wound noted to be completely healed. Wound vac off.  Today 8/15: reprots muscl epain throughout her body.     Review of Systems: Review of Systems  All other systems reviewed and are negative.   Past Medical History:  Diagnosis Date   Chronic kidney disease    Depression    Fibromyalgia    Gout    HTN (hypertension)    Insomnia    Morbid obesity (HCC)    Osteoarthritis    Palpitation    Sleep apnea     Social History   Tobacco Use   Smoking status: Never   Smokeless tobacco: Never  Vaping Use   Vaping Use: Never used  Substance Use Topics   Alcohol use: Never    Alcohol/week: 0.0 standard drinks of alcohol   Drug use: Never    Family History  Problem Relation Age of Onset   Hypothyroidism Mother    Hyperlipidemia Other    Kidney failure Father    Dementia Father    Gout Father    Arthritis Father    Prostate cancer Father     Allergies  Allergen Reactions   Cefuroxime Axetil Rash and Hives   Clarithromycin Rash and Hives    Other reaction(s): Unknown   Nsaids Nausea  And Vomiting    Other reaction(s): Unknown   Cefuroxime     Other reaction(s): Unknown   Oxycodone Other (See Comments)    Has taken hydrocodone then oxycodone and did not have hallucinations   Oxycodone-Acetaminophen Other (See Comments)     Other reaction(s): Unknown   Robaxin [Methocarbamol] Nausea And Vomiting    "It tears my stomach up."   Toradol [Ketorolac Tromethamine] Nausea And Vomiting   Tramadol Nausea And Vomiting and Other (See Comments)    Upset stomach Other reaction(s): Unknown    Health Maintenance  Topic Date Due   Hepatitis C Screening  Never done   Zoster Vaccines-  Shingrix (1 of 2) Never done   COVID-19 Vaccine (4 - Moderna risk series) 03/19/2020   INFLUENZA VACCINE  10/20/2021   MAMMOGRAM  01/01/2023   COLONOSCOPY (Pts 45-78yr Insurance coverage will need to be confirmed)  01/04/2024   TETANUS/TDAP  01/07/2030   Pneumonia Vaccine 71 Years old  Completed   DEXA SCAN  Completed   HPV VACCINES  Aged Out    Objective:  There were no vitals filed for this visit. There is no height or weight on file to calculate BMI.  Physical Exam Constitutional:      Appearance: Normal appearance.  HENT:     Head: Normocephalic and atraumatic.     Right Ear: Tympanic membrane normal.     Left Ear: Tympanic membrane normal.     Nose: Nose normal.     Mouth/Throat:     Mouth: Mucous membranes are moist.  Eyes:     Extraocular Movements: Extraocular movements intact.     Conjunctiva/sclera: Conjunctivae normal.     Pupils: Pupils are equal, round, and reactive to light.  Cardiovascular:     Rate and Rhythm: Normal rate and regular rhythm.     Heart sounds: No murmur heard.    No friction rub. No gallop.  Pulmonary:     Effort: Pulmonary effort is normal.     Breath sounds: Normal breath sounds.  Abdominal:     General: Abdomen is flat.     Palpations: Abdomen is soft.  Musculoskeletal:        General: Normal range of motion.  Skin:    General: Skin is  warm and dry.  Neurological:     General: No focal deficit present.     Mental Status: She is alert and oriented to person, place, and time.  Psychiatric:        Mood and Affect: Mood normal.     Lab Results Lab Results  Component Value Date   WBC 5.3 07/30/2021   HGB 12.0 07/30/2021   HCT 36.7 07/30/2021   MCV 87.0 07/30/2021   PLT 332 07/30/2021    Lab Results  Component Value Date   CREATININE 1.08 (H) 07/30/2021   BUN 17 07/30/2021   NA 140 07/30/2021   K 4.2 07/30/2021   CL 105 07/30/2021   CO2 24 07/30/2021    Lab Results  Component Value Date   ALT 7 07/30/2021   AST 13 07/30/2021   ALKPHOS 130 (H) 03/30/2021   BILITOT 0.4 07/30/2021    Lab Results  Component Value Date   CHOL 169 09/03/2020   HDL 70.70 09/03/2020   LDLCALC 90 09/03/2020   TRIG 42.0 09/03/2020   CHOLHDL 2 09/03/2020   No results found for: "LABRPR", "RPRTITER" No results found for: "HIV1RNAQUANT", "HIV1RNAVL", "CD4TABS" A/P #Lumbar wound infection with retained hardware SP I&D on 03/25/21  #OR Cx from 03/25/21 +  enterobacter aeruginosa, Morganella morganii,  bacteroides fragilis(beta lactamase positive) -Completed 2 weeks of cefepime from OR on 1/24 and started on levaquin '750mg'$  PO q48h(renally dosed) and continue metronidazole '500mg'$  PO bid(EOT 05/25/21). Then continued on levaquin and metronidazole for chronic suppression. -Since last visit pt has developed musle pain all over, and more pronounced in b/l forearm for the last month. It doesn not feel like her RA joint pain. Metronidazole has been associated with myalgias as such will change to augmentin.  Plan: -D/c metronidazole -Start augmentin '500mg'$  pO bid in the setting musle pain. Pt reports she has tolerated Augmentin the past -Continue  levaquin 500 per day(increased due to improved renal clearance) -labs today -F/U Rheumotology per wosring joint pain(has Dx of RA) -F/U with In 1 month for labs  #Diarrhea, likely antibiotic  associated -Improved with probiotics   I spent more than 45 minutes for this patient encounter including reviewing data/chart, and coordinating care and >50% direct face to face time providing counseling/discussing diagnostics/treatment plan with patient   Laurice Record, [facility] 11/03/2021, 10:24 AM

## 2021-11-03 NOTE — Progress Notes (Signed)
Office Visit Note  [name]             Date of Birth: Nov 07, 1950           MRN: [id number]             PCP: Isaac Bliss, [name], MD Referring: Isaac Bliss, [place] [facility]* Visit Date: 11/17/2021 Occupation: '@GUAROCC'$ @  Subjective:  Pain in both hands  History of Present Illness: Amy Hooper is a 71 y.o. female with history of with gout, osteoarthritis, degenerative disc disease and positive rheumatoid factor.  She returns today after her last visit in December 2022.  At that time we decided to give her a trial of hydroxychloroquine due to ongoing pain and discomfort in her joints.  She states she did not start hydroxychloroquine.  She underwent lumbar spine surgery by Dr. Lorin Mercy March 09, 2021.  She states she had infection after that.  She has been under care of Dr. Lorin Mercy and infectious disease since then.  She was advised not to take any immunosuppressive agents.  She continues to have pain and discomfort in her bilateral hands.  She continues to have discomfort in her both shoulders and her right knee.  Left knee joint is replaced.  She continues to have discomfort in her feet as well.  She has been on allopurinol 200 mg p.o. daily.  She has not had any gout flare she continues to have some generalized pain and discomfort from fibromyalgia.  Activities of Daily Living:  Patient reports morning stiffness for all day. Patient Reports nocturnal pain.  Difficulty dressing/grooming: Reports Difficulty climbing stairs: Reports Difficulty getting out of chair: Reports Difficulty using hands for taps, buttons, cutlery, and/or writing: Reports  Review of Systems  Constitutional:  Positive for fatigue.  HENT:  Negative for mouth sores and mouth dryness.   Eyes:  Negative for dryness.  Respiratory:  Negative for shortness of breath.   Cardiovascular:  Negative for chest pain and palpitations.  Gastrointestinal:  Negative for blood in stool, constipation and  diarrhea.  Endocrine: Negative for increased urination.  Genitourinary:  Negative for involuntary urination.  Musculoskeletal:  Positive for joint pain, gait problem, joint pain, joint swelling, myalgias, muscle weakness, morning stiffness, muscle tenderness and myalgias.  Skin:  Positive for sensitivity to sunlight. Negative for color change, rash and hair loss.  Allergic/Immunologic: Negative for susceptible to infections.  Neurological:  Negative for dizziness and headaches.  Hematological:  Negative for swollen glands.  Psychiatric/Behavioral:  Positive for depressed mood. Negative for sleep disturbance. The patient is nervous/anxious.     PMFS History:  Patient Active Problem List   Diagnosis Date Noted   Acute on chronic renal insufficiency 03/27/2021   Postoperative complication of skin involving drainage from surgical wound 03/25/2021   Post op infection 03/24/2021   S/P lumbar fusion 03/24/2021   Lumbar stenosis 03/09/2021   Degenerative spondylolisthesis 02/19/2021   Primary osteoarthritis of left knee 09/08/2020   DJD (degenerative joint disease) of knee 09/08/2020   Status post total left knee replacement 09/08/2020   Spondylosis without myelopathy or radiculopathy, lumbar region 07/17/2020   Chronic pain syndrome 07/17/2020   Myalgia 07/17/2020   OSA (obstructive sleep apnea) 05/19/2020   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/17/2020   Sleep disturbance 04/17/2020   Chronic pain of both shoulders 03/03/2020   Pain in both hands 03/03/2020   Primary osteoarthritis of both knees 03/03/2020   Pain in both feet 03/03/2020   History of chronic kidney  disease 03/03/2020   DDD (degenerative disc disease), lumbar 02/05/2020   Vitamin D deficiency 01/09/2020   Hyperlipidemia 01/09/2020   CKD (chronic kidney disease) stage 3, GFR 30-59 ml/min ([place]) 01/09/2020   HTN (hypertension)    Morbid obesity (HCC)    Gout    Fibromyalgia    Depression    Insomnia      Past Medical History:  Diagnosis Date   Chronic kidney disease    Depression    Fibromyalgia    Gout    HTN (hypertension)    Insomnia    Morbid obesity (HCC)    Osteoarthritis    Palpitation    Sleep apnea     Family History  Problem Relation Age of Onset   Hypothyroidism Mother    Hyperlipidemia Other    Kidney failure Father    Dementia Father    Gout Father    Arthritis Father    Prostate cancer Father    Past Surgical History:  Procedure Laterality Date   APPLICATION OF WOUND VAC N/A 03/25/2021   Procedure: APPLICATION OF WOUND VAC;  Surgeon: [name], MD;  Location: [facility];  Service: Orthopedics;  Laterality: N/A;   APPLICATION OF WOUND VAC N/A 03/30/2021   Procedure: WOUND VAC CHANGE 12x6x5;  Surgeon: [name], MD;  Location: [facility];  Service: Orthopedics;  Laterality: N/A;   INCISION AND DRAINAGE OF WOUND N/A 03/25/2021   Procedure: LUMBAR POST OP INCISION IRRIGATION;  Surgeon: [name], MD;  Location: [facility];  Service: Orthopedics;  Laterality: N/A;   JOINT REPLACEMENT     KNEE ARTHROSCOPY     LUMBAR WOUND DEBRIDEMENT N/A 03/30/2021   Procedure: REPEAT LUMBAR WOUND DEBRIDEMENT;  Surgeon: [name], MD;  Location: [facility];  Service: Orthopedics;  Laterality: N/A;   right rotator cuff     ROTATOR CUFF REPAIR Left    TOTAL KNEE ARTHROPLASTY Left 09/08/2020   Procedure: LEFT TOTAL KNEE ARTHROPLASTY;  Surgeon: Leandrew Koyanagi, MD;  Location: [facility];  Service: Orthopedics;  Laterality: Left;   TUBAL LIGATION     Social History   Social History Narrative   Lives gives with fiance.     Immunization History  Administered Date(s) Administered   Influenza-Unspecified 01/31/2020, 01/13/2021   Moderna Sars-Covid-2 Vaccination 05/02/2019, 05/30/2019, 01/23/2020   Pneumococcal Conjugate-13 06/05/2018   Pneumococcal Polysaccharide-23 01/08/2020   Tdap 01/08/2020     Objective: Vital Signs: BP 113/73 (BP Location: Left Arm, Patient Position: Sitting, Cuff Size:  Normal)   Pulse 70   Resp 14   Ht '5\' 2"'$  (1.575 m)   Wt 196 lb 9.6 oz (89.2 kg)   BMI 35.96 kg/m    Physical Exam Vitals and nursing note reviewed.  Constitutional:      Appearance: She is well-developed.  HENT:     Head: Normocephalic and atraumatic.  Eyes:     Conjunctiva/sclera: Conjunctivae normal.  Cardiovascular:     Rate and Rhythm: Normal rate and regular rhythm.     Heart sounds: Normal heart sounds.  Pulmonary:     Effort: Pulmonary effort is normal.     Breath sounds: Normal breath sounds.  Abdominal:     General: Bowel sounds are normal.     Palpations: Abdomen is soft.  Musculoskeletal:     Cervical back: Normal range of motion.  Lymphadenopathy:     Cervical: No cervical adenopathy.  Skin:    General: Skin is warm and dry.  Capillary Refill: Capillary refill takes less than 2 seconds.  Neurological:     Mental Status: She is alert and oriented to person, place, and time.  Psychiatric:        Behavior: Behavior normal.      Musculoskeletal Exam: Cervical spine was in good range of motion.  She had limited painful range of motion of her lumbar spine.  She had good range of motion of bilateral shoulder joints with some discomfort.  She had tenderness over right lateral epicondyle region.  Wrist joints, MCPs PIPs and DIPs with good range of motion.  She continues to have tenderness over MCPs and PIP joints.  No synovitis was noted.  Hip joints in good range of motion.  Bilateral knee joints with good range of motion.  She had discomfort range of motion of her right knee joint.  Left knee joint is replaced.  There was no tenderness over ankles or MTPs.  CDAI Exam: CDAI Score: -- Patient Global: --; Provider Global: -- Swollen: 0 ; Tender: 0  Joint Exam 11/17/2021   No joint exam has been documented for this visit   There is currently no information documented on the homunculus. Go to the Rheumatology activity and complete the homunculus joint  exam.  Investigation: No additional findings.  Imaging: XR Lumbar Spine 2-3 Views  Result Date: 10/28/2021 AP lateral lumbar spine images are obtained and reviewed that show single level instrumented fusion L4-5 with some progressive bone incorporation anterior to the cage comparison to 1227 and June radiographs. No loosening of the screws and no cage subsidence noted.  Cage position is maintained. Impression: Status post single level L4-5 instrumented fusion satisfactory radiographs.   Recent Labs: Lab Results  Component Value Date   WBC 6.0 11/03/2021   HGB 12.1 11/03/2021   PLT 283 11/03/2021   NA 138 11/03/2021   K 4.2 11/03/2021   CL 106 11/03/2021   CO2 25 11/03/2021   GLUCOSE 91 11/03/2021   BUN 19 11/03/2021   CREATININE 1.21 (H) 11/03/2021   BILITOT 0.5 11/03/2021   ALKPHOS 130 (H) 03/30/2021   AST 11 11/03/2021   ALT 5 (L) 11/03/2021   PROT 6.7 11/03/2021   ALBUMIN 2.7 (L) 03/30/2021   CALCIUM 9.5 11/03/2021   GFRAA 56 (L) 02/05/2020    Speciality Comments: No specialty comments available.  Procedures:  No procedures performed Allergies: Cefuroxime axetil, Clarithromycin, Nsaids, Cefuroxime, Robaxin [methocarbamol], Toradol [ketorolac tromethamine], and Tramadol   Assessment / Plan:     Visit Diagnoses: Rheumatoid factor positive - rheumatoid factor , anti-CCP negative.  She has positive rheumatoid factor.  No synovitis was noted on the examination.  Although she continues to have pain and discomfort in her bilateral hands.  We discussed possible trial of hydroxychloroquine at the last visit in December 2022.  Patient never restarted hydroxychloroquine.  She has had recurrent infections since she had lumbar spine fusion.  She had course of IV antibiotics followed by oral antibiotics.  I do not see any need for immunosuppression at this time.  I advised patient to contact me if she develops any joint swelling.  We also discussed possible use of ultrasound to evaluate  for synovitis if she develops swelling.  Chronic pain of both shoulders -she continues to have some discomfort in her shoulders.  She has history of rotator cuff tear of bilateral shoulders.  She states she had right rotator cuff tear surgery in May 2021.  Right lateral epicondylitis-patient has been using computer mouse.  She has developed pain and discomfort over the right epicondyle region.  Use of tennis elbow brace and Voltaren gel was discussed.  Pain in both hands-she complains of pain and discomfort in her bilateral hands.  No synovitis was noted.  Primary osteoarthritis of right knee - Diagnosed with end-stage osteoarthritis in the past.  Patient states that she will need right total knee replacement in the future.  Status post total knee replacement, left - By Dr.Xu in June 2022.  Doing well with good range of motion.  Pain in both feet-she has off-and-on discomfort in her feet.  No synovitis was noted.  DDD (degenerative disc disease), lumbar-L4-L5 stenosis anterolisthesis status post L4-5 transforaminal lumbar interbody fusion with pedicle instrumentation cage lateral fusion right L5-S1 microdiscectomy on 03/09/2021.  She has had multiple infections since then.  She is under care of infectious disease.  Idiopathic chronic gout of multiple sites without tophus - allopurinol 200 mg p.o. daily.  She denies having any gout flare.  Her last uric acid was 4.2 in 2021.  Fibromyalgia-she cannot history of some generalized pain and discomfort.  Need for regular exercise and stretching was discussed.  Other fatigue-related to fibromyalgia.  Osteopenia of multiple sites - followed by her PCP.I do not have results of DEXA scan available.  Other medical problems are listed as follows:  Primary hypertension  Vitamin D deficiency  History of chronic kidney disease - She is followed at [facility]  Other insomnia  History of depression  History of hyperlipidemia  Orders: No  orders of the defined types were placed in this encounter.  No orders of the defined types were placed in this encounter.    Follow-Up Instructions: Return in about 5 months (around 04/19/2022) for Rheumatoid arthritis, Osteoarthritis.   Bo Merino, MD  Note - This record has been created using Editor, commissioning.  Chart creation errors have been sought, but may not always  have been located. Such creation errors do not reflect on  the standard of medical care.

## 2021-11-04 LAB — CBC WITH DIFFERENTIAL/PLATELET
Absolute Monocytes: 720 cells/uL (ref 200–950)
Basophils Absolute: 30 cells/uL (ref 0–200)
Basophils Relative: 0.5 %
Eosinophils Absolute: 180 cells/uL (ref 15–500)
Eosinophils Relative: 3 %
HCT: 35.1 % (ref 35.0–45.0)
Hemoglobin: 12.1 g/dL (ref 11.7–15.5)
Lymphs Abs: 1578 cells/uL (ref 850–3900)
MCH: 30.2 pg (ref 27.0–33.0)
MCHC: 34.5 g/dL (ref 32.0–36.0)
MCV: 87.5 fL (ref 80.0–100.0)
MPV: 9.5 fL (ref 7.5–12.5)
Monocytes Relative: 12 %
Neutro Abs: 3492 cells/uL (ref 1500–7800)
Neutrophils Relative %: 58.2 %
Platelets: 283 10*3/uL (ref 140–400)
RBC: 4.01 10*6/uL (ref 3.80–5.10)
RDW: 13.5 % (ref 11.0–15.0)
Total Lymphocyte: 26.3 %
WBC: 6 10*3/uL (ref 3.8–10.8)

## 2021-11-04 LAB — COMPLETE METABOLIC PANEL WITH GFR
AG Ratio: 1.5 (calc) (ref 1.0–2.5)
ALT: 5 U/L — ABNORMAL LOW (ref 6–29)
AST: 11 U/L (ref 10–35)
Albumin: 4 g/dL (ref 3.6–5.1)
Alkaline phosphatase (APISO): 69 U/L (ref 37–153)
BUN/Creatinine Ratio: 16 (calc) (ref 6–22)
BUN: 19 mg/dL (ref 7–25)
CO2: 25 mmol/L (ref 20–32)
Calcium: 9.5 mg/dL (ref 8.6–10.4)
Chloride: 106 mmol/L (ref 98–110)
Creat: 1.21 mg/dL — ABNORMAL HIGH (ref 0.50–1.05)
Globulin: 2.7 g/dL (calc) (ref 1.9–3.7)
Glucose, Bld: 91 mg/dL (ref 65–99)
Potassium: 4.2 mmol/L (ref 3.5–5.3)
Sodium: 138 mmol/L (ref 135–146)
Total Bilirubin: 0.5 mg/dL (ref 0.2–1.2)
Total Protein: 6.7 g/dL (ref 6.1–8.1)
eGFR: 49 mL/min/{1.73_m2} — ABNORMAL LOW (ref >=60)

## 2021-11-04 LAB — CK: Total CK: 46 U/L (ref 29–143)

## 2021-11-04 LAB — SEDIMENTATION RATE: Sed Rate: 43 mm/h — ABNORMAL HIGH (ref 0–30)

## 2021-11-04 LAB — C-REACTIVE PROTEIN: CRP: 16.6 mg/L — ABNORMAL HIGH (ref <8.0)

## 2021-11-05 ENCOUNTER — Encounter: Payer: Self-pay | Admitting: Registered Nurse

## 2021-11-05 ENCOUNTER — Encounter: Payer: Self-pay | Admitting: Internal Medicine

## 2021-11-05 ENCOUNTER — Encounter: Payer: Commercial Managed Care - PPO | Attending: Physical Medicine and Rehabilitation | Admitting: Registered Nurse

## 2021-11-05 VITALS — BP 111/73 | HR 72 | Ht 62.0 in | Wt 198.4 lb

## 2021-11-05 DIAGNOSIS — Z5181 Encounter for therapeutic drug level monitoring: Secondary | ICD-10-CM | POA: Diagnosis present

## 2021-11-05 DIAGNOSIS — M791 Myalgia, unspecified site: Secondary | ICD-10-CM | POA: Insufficient documentation

## 2021-11-05 DIAGNOSIS — M7061 Trochanteric bursitis, right hip: Secondary | ICD-10-CM | POA: Diagnosis present

## 2021-11-05 DIAGNOSIS — M797 Fibromyalgia: Secondary | ICD-10-CM | POA: Insufficient documentation

## 2021-11-05 DIAGNOSIS — M545 Low back pain, unspecified: Secondary | ICD-10-CM | POA: Insufficient documentation

## 2021-11-05 DIAGNOSIS — Z79891 Long term (current) use of opiate analgesic: Secondary | ICD-10-CM | POA: Insufficient documentation

## 2021-11-05 DIAGNOSIS — M1711 Unilateral primary osteoarthritis, right knee: Secondary | ICD-10-CM | POA: Diagnosis present

## 2021-11-05 DIAGNOSIS — M7062 Trochanteric bursitis, left hip: Secondary | ICD-10-CM | POA: Diagnosis present

## 2021-11-05 DIAGNOSIS — G8929 Other chronic pain: Secondary | ICD-10-CM | POA: Insufficient documentation

## 2021-11-05 DIAGNOSIS — G894 Chronic pain syndrome: Secondary | ICD-10-CM | POA: Insufficient documentation

## 2021-11-05 MED ORDER — HYDROCODONE-ACETAMINOPHEN 7.5-325 MG PO TABS: 1.0000 | ORAL_TABLET | Freq: Two times a day (BID) | ORAL | 0 refills | Status: DC | PRN

## 2021-11-05 NOTE — Progress Notes (Signed)
Subjective:    Patient ID: Amy Hooper, female    DOB: 21-May-1950, 71 y.o.   MRN: [id number]  HPI: Amy Hooper is a 71 y.o. female who returns for follow up appointment for chronic pain and medication refill. She states her pain is located in her lower back, bilateral hips, right knee and generalized muscle aches she reports.  She rates her pain 9. Her current exercise regime is walking and performing stretching exercises.  Amy Hooper Morphine equivalent is 15.00 MME.   Last UDS was Performed on 08/06/2021, it was consistent.    Pain Inventory Average Pain 8 Pain Right Now 9 My pain is dull and stabbing  In the last 24 hours, has pain interfered with the following? General activity 8 Relation with others 4 Enjoyment of life 10 What TIME of day is your pain at its worst? morning , evening, and night Sleep (in general) Fair  Pain is worse with: walking, bending, sitting, inactivity, standing, and some activites Pain improves with: heat/ice and medication Relief from Meds: 5  Family History  Problem Relation Age of Onset   Hypothyroidism Mother    Hyperlipidemia Other    Kidney failure Father    Dementia Father    Gout Father    Arthritis Father    Prostate cancer Father    Social History   Socioeconomic History   Marital status: Married    Spouse name: Not on file   Number of children: 3   Years of education: Not on file   Highest education level: Bachelor's degree (e.g., BA, AB, BS)  Occupational History   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     Social Determinants of Health   Financial Resource Strain: Low Risk  (03/01/2021)    Overall Financial Resource Strain (CARDIA)    Difficulty of Paying Living Expenses: Not hard at all  Food Insecurity: No Food Insecurity (03/01/2021)   Hunger Vital Sign    Worried About Running Out of Food in the Last Year: Never true    Ran Out of Food in the Last Year: Never true  Transportation Needs: No Transportation Needs (03/01/2021)   PRAPARE - Hydrologist (Medical): No    Lack of Transportation (Non-Medical): No  Physical Activity: Unknown (03/01/2021)   Exercise Vital Sign    Days of Exercise per Week: 0 days    Minutes of Exercise per Session: Not on file  Stress: No Stress Concern Present (03/01/2021)   [place]    Feeling of Stress : Only a little  Social Connections: Moderately Isolated (03/01/2021)   Social Connection and Isolation Panel [NHANES]    Frequency of Communication with Friends and Family: More than three times a week    Frequency of Social Gatherings with Friends and Family: Once a week    Attends Religious Services: Never    Marine scientist or Organizations: No    Attends Music therapist: Not on file    Marital Status: Married   Past Surgical History:  Procedure  Laterality Date   APPLICATION OF WOUND VAC N/A 03/25/2021   Procedure: APPLICATION OF WOUND VAC;  Surgeon: [name], MD;  Location: [place];  Service: Orthopedics;  Laterality: N/A;   APPLICATION OF WOUND VAC N/A 03/30/2021   Procedure: WOUND VAC CHANGE 12x6x5;  Surgeon: [name], MD;  Location: [place];  Service: Orthopedics;  Laterality: N/A;   INCISION AND DRAINAGE OF WOUND N/A 03/25/2021   Procedure: LUMBAR POST OP INCISION IRRIGATION;  Surgeon: [name], MD;  Location: [place];  Service: Orthopedics;  Laterality: N/A;   JOINT REPLACEMENT     KNEE ARTHROSCOPY     LUMBAR WOUND DEBRIDEMENT N/A 03/30/2021   Procedure: REPEAT LUMBAR WOUND DEBRIDEMENT;  Surgeon: [name], MD;  Location: [place];  Service: Orthopedics;  Laterality: N/A;   right rotator cuff     ROTATOR CUFF REPAIR Left    TOTAL KNEE ARTHROPLASTY Left 09/08/2020   Procedure: LEFT TOTAL KNEE ARTHROPLASTY;  Surgeon: Leandrew Koyanagi, MD;  Location: [place];  Service: Orthopedics;  Laterality: Left;   TUBAL LIGATION     Past Surgical History:  Procedure Laterality Date   APPLICATION OF WOUND VAC N/A 03/25/2021   Procedure: APPLICATION OF WOUND VAC;  Surgeon: [name], MD;  Location: [place];  Service: Orthopedics;  Laterality: N/A;   APPLICATION OF WOUND VAC N/A 03/30/2021   Procedure: WOUND VAC CHANGE 12x6x5;  Surgeon: [name], MD;  Location: [place];  Service: Orthopedics;  Laterality: N/A;   INCISION AND DRAINAGE OF WOUND N/A 03/25/2021   Procedure: LUMBAR POST OP INCISION IRRIGATION;  Surgeon: [name], MD;  Location: [place];  Service: Orthopedics;  Laterality: N/A;   JOINT REPLACEMENT     KNEE ARTHROSCOPY     LUMBAR WOUND DEBRIDEMENT N/A 03/30/2021   Procedure: REPEAT LUMBAR WOUND DEBRIDEMENT;  Surgeon: [name], MD;  Location: [place];  Service: Orthopedics;  Laterality: N/A;   right rotator cuff     ROTATOR CUFF REPAIR Left    TOTAL KNEE ARTHROPLASTY Left 09/08/2020   Procedure: LEFT TOTAL KNEE ARTHROPLASTY;  Surgeon: Leandrew Koyanagi, MD;  Location: [place];  Service: Orthopedics;  Laterality: Left;   TUBAL LIGATION     Past Medical History:  Diagnosis Date   Chronic kidney disease    Depression    Fibromyalgia    Gout    HTN (hypertension)    Insomnia    Morbid obesity (HCC)    Osteoarthritis    Palpitation    Sleep apnea    BP 111/73   Pulse 72   Ht '5\' 2"'$  (1.575 m)   Wt 198 lb 6.4 oz (90 kg)   SpO2 99%   BMI 36.29 kg/m   Opioid Risk Score:   Fall Risk Score:  `1  Depression screen PHQ 2/9     11/03/2021   10:30 AM 09/02/2021    2:06 PM 08/06/2021   11:25 AM 06/11/2021   11:18 AM 05/19/2021   10:55 AM 04/14/2021   10:24 AM 03/05/2021    7:52 AM  Depression screen  PHQ 2/9  Decreased Interest 0 1 2 0 1 0 0  Down, Depressed, Hopeless 0 1 2 0 3 0 0  PHQ - 2 Score 0 2 4 0 4 0 0  Altered sleeping     3  0  Tired, decreased energy     2  0  Change in appetite     2  0  Feeling bad or failure about yourself      0  0  Trouble concentrating     0  0  Moving slowly or fidgety/restless     0  0  Suicidal thoughts     0  0  PHQ-9 Score     11  0  Difficult doing work/chores       Not difficult at all      Review of Systems  Musculoskeletal:  Positive for back pain.       Bilateral hand pain   All other systems reviewed and are negative.     Objective:   Physical Exam Vitals and nursing note reviewed.  Constitutional:      Appearance: Normal appearance. She is obese.  Cardiovascular:     Rate and Rhythm: Normal rate and regular rhythm.     Pulses: Normal pulses.     Heart sounds: Normal heart sounds.  Pulmonary:     Effort: Pulmonary effort is normal.     Breath sounds: Normal breath sounds.  Musculoskeletal:     Cervical back: Normal range of motion and neck supple.     Comments: Normal Muscle Bulk and Muscle Testing Reveals:  Upper Extremities: Full ROM and Muscle Strength 5/5 Thoracic and Lumbar Hypersensitivity Bilateral Greater Trochanter Tenderness Lower Extremities: Right Lower Extremity: Decreased ROM and Muscle Strength 5/5 Right Lower Extremity Flexion Produces Pain into her Right Patella Left Lower Extremity: Full ROM and Muscle Strength 5/5 Arises from Chair slowly using cane for support Antalgic  Gait     Skin:    General: Skin is warm and dry.  Neurological:     Mental Status: She is alert and oriented to person, place, and time.  Psychiatric:        Mood and Affect: Mood normal.        Behavior: Behavior normal.          Assessment & Plan:  1, Chronic Bilateral Low Back Pain: S/P Lumbar Fusion: Dr Lorin Mercy Following. Continue HEP as Tolerated. Continue to Monitor. 11/05/2021 2. Post Operative Infection: ID  Following: Continue Antibiotics. Continue to Monitor. 11/05/2021 3. Fibromyalgia: Continue HEP as Tolerated. Continue Lyrica. Continue to Monitor. 11/05/2021 4. Primary Osteoarthritis: Left Knee: S/P on 09/08/20: Dr Erlinda Hong: LEFT TOTAL KNEE ARTHROPLASTY. Continue HEP as Tolerated. Continue to Monitor.  5. Primary Osteoarthritis Right Knee: Continue HEP as Tolerated. Ortho Following. Continue to Monitor. 11/05/2021 6. Chronic Pain Syndrome: Refilled: Hydrocodone 7.5 mf /325 one tablet twice a day as needed for pain #60. Continue Amitriptyline  Refilled: Hydrocodone 10 mg/325 mg one tablet twice a day as needed for pain #60. We will continue the opioid monitoring program, this consists of regular clinic visits, examinations, urine drug screen, pill counts as well as use of [place] Controlled Substance Reporting system. A 12 month History has been reviewed on the [place] Controlled Substance Reporting System on 11/05/2021 7. Depression: Continue Cymbalta. Continue to Monitor. 11/05/2021   F/U in 2 months

## 2021-11-06 ENCOUNTER — Telehealth: Payer: Self-pay

## 2021-11-06 NOTE — Telephone Encounter (Signed)
Received fax from [facility] regarding potential allergy to medication prescribed. Fax states that patient is Allergic to Cephalosporins and was prescribed Amox Tr/k Clav 500- 125 mg tabs.  Pharmacy would like to know how to proceed with prescription. Leatrice Jewels, RMA

## 2021-11-06 NOTE — Telephone Encounter (Signed)
I faxed them back this morning. It is appropriate to continue Augmentin given she has tolerated it in the past. - Estill Bamberg

## 2021-11-07 ENCOUNTER — Encounter: Payer: Self-pay | Admitting: Orthopaedic Surgery

## 2021-11-09 ENCOUNTER — Encounter: Payer: Self-pay | Admitting: Registered Nurse

## 2021-11-09 ENCOUNTER — Other Ambulatory Visit: Payer: Self-pay | Admitting: Orthopaedic Surgery

## 2021-11-09 NOTE — Telephone Encounter (Signed)
noted 

## 2021-11-17 ENCOUNTER — Ambulatory Visit: Payer: Commercial Managed Care - PPO | Attending: Rheumatology | Admitting: Rheumatology

## 2021-11-17 ENCOUNTER — Encounter: Payer: Self-pay | Admitting: Rheumatology

## 2021-11-17 VITALS — BP 113/73 | HR 70 | Resp 14 | Ht 62.0 in | Wt 196.6 lb

## 2021-11-17 DIAGNOSIS — Z79899 Other long term (current) drug therapy: Secondary | ICD-10-CM

## 2021-11-17 DIAGNOSIS — M25512 Pain in left shoulder: Secondary | ICD-10-CM

## 2021-11-17 DIAGNOSIS — G4709 Other insomnia: Secondary | ICD-10-CM

## 2021-11-17 DIAGNOSIS — E559 Vitamin D deficiency, unspecified: Secondary | ICD-10-CM

## 2021-11-17 DIAGNOSIS — M51369 Other intervertebral disc degeneration, lumbar region without mention of lumbar back pain or lower extremity pain: Secondary | ICD-10-CM

## 2021-11-17 DIAGNOSIS — M0579 Rheumatoid arthritis with rheumatoid factor of multiple sites without organ or systems involvement: Secondary | ICD-10-CM

## 2021-11-17 DIAGNOSIS — M5136 Other intervertebral disc degeneration, lumbar region: Secondary | ICD-10-CM

## 2021-11-17 DIAGNOSIS — M1711 Unilateral primary osteoarthritis, right knee: Secondary | ICD-10-CM

## 2021-11-17 DIAGNOSIS — M25511 Pain in right shoulder: Secondary | ICD-10-CM | POA: Diagnosis not present

## 2021-11-17 DIAGNOSIS — I10 Essential (primary) hypertension: Secondary | ICD-10-CM

## 2021-11-17 DIAGNOSIS — M7711 Lateral epicondylitis, right elbow: Secondary | ICD-10-CM | POA: Diagnosis not present

## 2021-11-17 DIAGNOSIS — Z8659 Personal history of other mental and behavioral disorders: Secondary | ICD-10-CM

## 2021-11-17 DIAGNOSIS — R5383 Other fatigue: Secondary | ICD-10-CM

## 2021-11-17 DIAGNOSIS — M79671 Pain in right foot: Secondary | ICD-10-CM

## 2021-11-17 DIAGNOSIS — M1A09X0 Idiopathic chronic gout, multiple sites, without tophus (tophi): Secondary | ICD-10-CM

## 2021-11-17 DIAGNOSIS — M79672 Pain in left foot: Secondary | ICD-10-CM

## 2021-11-17 DIAGNOSIS — R768 Other specified abnormal immunological findings in serum: Secondary | ICD-10-CM

## 2021-11-17 DIAGNOSIS — Z8639 Personal history of other endocrine, nutritional and metabolic disease: Secondary | ICD-10-CM

## 2021-11-17 DIAGNOSIS — M8589 Other specified disorders of bone density and structure, multiple sites: Secondary | ICD-10-CM

## 2021-11-17 DIAGNOSIS — M79642 Pain in left hand: Secondary | ICD-10-CM

## 2021-11-17 DIAGNOSIS — M79641 Pain in right hand: Secondary | ICD-10-CM

## 2021-11-17 DIAGNOSIS — M797 Fibromyalgia: Secondary | ICD-10-CM

## 2021-11-17 DIAGNOSIS — G8929 Other chronic pain: Secondary | ICD-10-CM

## 2021-11-17 DIAGNOSIS — Z96652 Presence of left artificial knee joint: Secondary | ICD-10-CM

## 2021-11-17 DIAGNOSIS — Z87448 Personal history of other diseases of urinary system: Secondary | ICD-10-CM

## 2021-11-18 ENCOUNTER — Other Ambulatory Visit: Payer: Self-pay | Admitting: Registered Nurse

## 2021-11-20 ENCOUNTER — Other Ambulatory Visit: Payer: Self-pay

## 2021-11-20 DIAGNOSIS — M4626 Osteomyelitis of vertebra, lumbar region: Secondary | ICD-10-CM

## 2021-11-20 MED ORDER — LEVOFLOXACIN 500 MG PO TABS: 500.0000 mg | ORAL_TABLET | Freq: Every day | ORAL | 0 refills | Status: DC

## 2021-12-01 ENCOUNTER — Ambulatory Visit: Payer: Commercial Managed Care - PPO | Admitting: Orthopaedic Surgery

## 2021-12-01 ENCOUNTER — Telehealth: Payer: Self-pay

## 2021-12-01 DIAGNOSIS — M1711 Unilateral primary osteoarthritis, right knee: Secondary | ICD-10-CM | POA: Diagnosis not present

## 2021-12-01 MED ORDER — BUPIVACAINE HCL 0.25 % IJ SOLN
2.0000 mL | INTRAMUSCULAR | Status: AC | PRN
  Administered 2021-12-01: 2 mL via INTRA_ARTICULAR

## 2021-12-01 MED ORDER — METHYLPREDNISOLONE ACETATE 40 MG/ML IJ SUSP
40.0000 mg | INTRAMUSCULAR | Status: AC | PRN
  Administered 2021-12-01: 40 mg via INTRA_ARTICULAR

## 2021-12-01 MED ORDER — LIDOCAINE HCL 1 % IJ SOLN
2.0000 mL | INTRAMUSCULAR | Status: AC | PRN
  Administered 2021-12-01: 2 mL

## 2021-12-01 NOTE — Telephone Encounter (Signed)
Please precert for right knee gel injections. Dr.Xu's patient.

## 2021-12-01 NOTE — Progress Notes (Signed)
Office Visit Note   [name]           Date of Birth: Jan 17, 1951           MRN: [id number] Visit Date: 12/01/2021              Requested by: Isaac Bliss, [name], MD [place],  [place] [address] PCP: Isaac Bliss, [name], MD   Assessment & Plan: Visit Diagnoses:  1. Unilateral primary osteoarthritis, right knee     Plan: Impression is right knee osteoarthritis.  Today, we discussed various treatment options to include repeat cortisone injection versus viscosupplementation injection.  She would like to proceed with cortisone injection today and get approval for viscosupplementation injection.  She will need to be off of oral antibiotics for 2 months before we are able to proceed with total knee arthroplasty.  Anticipate this will be next spring.  She will follow-up with [place] as needed.  This patient is diagnosed with osteoarthritis of the knee(s).    Radiographs show evidence of joint space narrowing, osteophytes, subchondral sclerosis and/or subchondral cysts.  This patient has knee pain which interferes with functional and activities of daily living.    This patient has experienced inadequate response, adverse effects and/or intolerance with conservative treatments such as acetaminophen, NSAIDS, topical creams, physical therapy or regular exercise, knee bracing and/or weight loss.   This patient has experienced inadequate response or has a contraindication to intra articular steroid injections for at least 3 months.   This patient is not scheduled to have a total knee replacement within 6 months of starting treatment with viscosupplementation.   Follow-Up Instructions: No follow-ups on file.   Orders:  Orders Placed This Encounter  Procedures   Large Joint Inj: R knee   No orders of the defined types were placed in this encounter.     Procedures: Large Joint Inj: R knee on 12/01/2021 1:01 PM Indications: pain Details: 22  G needle, anterolateral approach Medications: 2 mL lidocaine 1 %; 2 mL bupivacaine 0.25 %; 40 mg methylPREDNISolone acetate 40 MG/ML      Clinical Data: No additional findings.   Subjective: Chief Complaint  Patient presents with   Right Knee - Pain    HPI patient is a pleasant 71 year old female who comes in today with recurrent right knee pain.  History of underlying osteoarthritis.  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  We are unable to proceed with total knee arthroplasty until she has 2 months out from being finished with oral antibiotics for which she will need to be on for a total of 1 year.  She anticipates that this will be around March.  She notes that her right knee continues to bother her.  She describes constant pain worse with activity.  She is taking hydrocodone as needed.  She has undergone right knee cortisone injections in the past with the last one being 3 months ago.  These typically alleviate her symptoms for about a month.  She is also undergone viscosupplementation injections which provided about the same amount of time.  Review of Systems as detailed in HPI.  All others reviewed and are negative.   Objective: Vital Signs: There were no vitals taken for this visit.  Physical Exam well-developed well-nourished female no acute distress.  Alert and oriented x3.  Ortho Exam stable right knee exam  Specialty Comments:  No specialty comments available.  Imaging: No new imaging   PMFS History: Patient Active Problem List   Diagnosis Date Noted   Acute on chronic renal insufficiency 03/27/2021   Postoperative complication of skin involving drainage from surgical wound 03/25/2021   Post op infection 03/24/2021   S/P lumbar fusion 03/24/2021   Lumbar stenosis 03/09/2021   Degenerative spondylolisthesis 02/19/2021   Primary osteoarthritis of left knee 09/08/2020    DJD (degenerative joint disease) of knee 09/08/2020   Status post total left knee replacement 09/08/2020   Spondylosis without myelopathy or radiculopathy, lumbar region 07/17/2020   Chronic pain syndrome 07/17/2020   Myalgia 07/17/2020   OSA (obstructive sleep apnea) 05/19/2020   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/17/2020   Sleep disturbance 04/17/2020   Chronic pain of both shoulders 03/03/2020   Pain in both hands 03/03/2020   Primary osteoarthritis of both knees 03/03/2020   Pain in both feet 03/03/2020   History of chronic kidney disease 03/03/2020   DDD (degenerative disc disease), lumbar 02/05/2020   Vitamin D deficiency 01/09/2020   Hyperlipidemia 01/09/2020   CKD (chronic kidney disease) stage 3, GFR 30-59 ml/min ([place]) 01/09/2020   HTN (hypertension)    Morbid obesity ([place])    Gout    Fibromyalgia    Depression    Insomnia    Past Medical History:  Diagnosis Date   Chronic kidney disease    Depression    Fibromyalgia    Gout    HTN (hypertension)    Insomnia    Morbid obesity ([place])    Osteoarthritis    Palpitation    Sleep apnea     Family History  Problem Relation Age of Onset   Hypothyroidism Mother    Hyperlipidemia Other    Kidney failure Father    Dementia Father    Gout Father    Arthritis Father    Prostate cancer Father     Past Surgical History:  Procedure Laterality Date   APPLICATION OF WOUND VAC N/A 03/25/2021   Procedure: APPLICATION OF WOUND VAC;  Surgeon: [name], MD;  Location: [place];  Service: Orthopedics;  Laterality: N/A;   APPLICATION OF WOUND VAC N/A 03/30/2021   Procedure: WOUND VAC CHANGE 12x6x5;  Surgeon: [name], MD;  Location: [place];  Service: Orthopedics;  Laterality: N/A;   INCISION AND DRAINAGE OF WOUND N/A 03/25/2021   Procedure: LUMBAR POST OP INCISION IRRIGATION;  Surgeon: [name], MD;  Location: [place];  Service: Orthopedics;  Laterality: N/A;   JOINT REPLACEMENT     KNEE ARTHROSCOPY      LUMBAR WOUND DEBRIDEMENT N/A 03/30/2021   Procedure: REPEAT LUMBAR WOUND DEBRIDEMENT;  Surgeon: [name], MD;  Location: [place];  Service: Orthopedics;  Laterality: N/A;   right rotator cuff     ROTATOR CUFF REPAIR Left    TOTAL KNEE ARTHROPLASTY Left 09/08/2020   Procedure: LEFT TOTAL KNEE ARTHROPLASTY;  Surgeon: Leandrew Koyanagi, MD;  Location: [place];  Service: Orthopedics;  Laterality: Left;   TUBAL LIGATION     Social History   Occupational History   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

## 2021-12-02 NOTE — Telephone Encounter (Signed)
VOB submitted for Durolane, right knee.  

## 2021-12-07 ENCOUNTER — Encounter: Payer: Self-pay | Admitting: Orthopaedic Surgery

## 2021-12-08 ENCOUNTER — Other Ambulatory Visit: Payer: Self-pay

## 2021-12-08 DIAGNOSIS — M1711 Unilateral primary osteoarthritis, right knee: Secondary | ICD-10-CM

## 2021-12-08 NOTE — Telephone Encounter (Signed)
Talked with patient and appointment has been scheduled for gel injection.  

## 2021-12-10 ENCOUNTER — Ambulatory Visit: Payer: Commercial Managed Care - PPO | Admitting: Orthopaedic Surgery

## 2021-12-10 DIAGNOSIS — M1711 Unilateral primary osteoarthritis, right knee: Secondary | ICD-10-CM | POA: Diagnosis not present

## 2021-12-10 MED ORDER — SODIUM HYALURONATE 60 MG/3ML IX PRSY
60.0000 mg | PREFILLED_SYRINGE | INTRA_ARTICULAR | Status: AC | PRN
  Administered 2021-12-10: 60 mg via INTRA_ARTICULAR

## 2021-12-10 NOTE — Progress Notes (Signed)
Office Visit Note   [name]           Date of Birth: 08/14/1950           MRN: [id number] Visit Date: 12/10/2021              Requested by: Isaac Bliss, [name], MD [place],  [place] [address] PCP: Isaac Bliss, [name], MD   Assessment & Plan: Visit Diagnoses:  1. Unilateral primary osteoarthritis, right knee     Plan: Impression is right knee osteoarthritis.  Today, proceed with right knee Durolane injection.  She tolerated this well.  She will follow-up as needed. Durolane injection lot [id number] expiration date 02/19/2024  Follow-Up Instructions: Return if symptoms worsen or fail to improve.   Orders:  No orders of the defined types were placed in this encounter.  No orders of the defined types were placed in this encounter.     Procedures: Large Joint Inj: R knee on 12/10/2021 4:42 PM Indications: pain Details: 22 G needle  Arthrogram: No  Medications: 60 mg Sodium Hyaluronate 60 MG/3ML Outcome: tolerated well, no immediate complications Patient was prepped and draped in the usual sterile fashion.       Clinical Data: No additional findings.   Subjective: Chief Complaint  Patient presents with   Right Knee - Injections    HPI patient is a pleasant 71 year old female with underlying right knee osteoarthritis who comes in today for right knee Durolane injection.  She has had viscosupplementation injections several years ago which did provide relief.     Objective: Vital Signs: There were no vitals taken for this visit.    Ortho Exam right knee exam is unchanged  Specialty Comments:  No specialty comments available.  Imaging: No new imaging   PMFS History: Patient Active Problem List   Diagnosis Date Noted   Acute on chronic renal insufficiency 03/27/2021   Postoperative complication of skin involving drainage from surgical wound 03/25/2021   Post op infection 03/24/2021   S/P lumbar  fusion 03/24/2021   Lumbar stenosis 03/09/2021   Degenerative spondylolisthesis 02/19/2021   Primary osteoarthritis of left knee 09/08/2020   DJD (degenerative joint disease) of knee 09/08/2020   Status post total left knee replacement 09/08/2020   Spondylosis without myelopathy or radiculopathy, lumbar region 07/17/2020   Chronic pain syndrome 07/17/2020   Myalgia 07/17/2020   OSA (obstructive sleep apnea) 05/19/2020   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/17/2020   Sleep disturbance 04/17/2020   Chronic pain of both shoulders 03/03/2020   Pain in both hands 03/03/2020   Primary osteoarthritis of both knees 03/03/2020   Pain in both feet 03/03/2020   History of chronic kidney disease 03/03/2020   DDD (degenerative disc disease), lumbar 02/05/2020   Vitamin D deficiency 01/09/2020   Hyperlipidemia 01/09/2020   CKD (chronic kidney disease) stage 3, GFR 30-59 ml/min ([place]) 01/09/2020   HTN (hypertension)    Morbid obesity ([place])    Gout    Fibromyalgia    Depression    Insomnia    Past Medical History:  Diagnosis Date   Chronic kidney disease    Depression    Fibromyalgia    Gout    HTN (hypertension)    Insomnia    Morbid obesity (HCC)    Osteoarthritis    Palpitation    Sleep apnea     Family History  Problem Relation Age of Onset   Hypothyroidism Mother    Hyperlipidemia  Other    Kidney failure Father    Dementia Father    Gout Father    Arthritis Father    Prostate cancer Father     Past Surgical History:  Procedure Laterality Date   APPLICATION OF WOUND VAC N/A 03/25/2021   Procedure: APPLICATION OF WOUND VAC;  Surgeon: [name], MD;  Location: [place];  Service: Orthopedics;  Laterality: N/A;   APPLICATION OF WOUND VAC N/A 03/30/2021   Procedure: WOUND VAC CHANGE 12x6x5;  Surgeon: [name], MD;  Location: [place];  Service: Orthopedics;  Laterality: N/A;   INCISION AND DRAINAGE OF WOUND N/A 03/25/2021   Procedure: LUMBAR POST OP INCISION  IRRIGATION;  Surgeon: [name], MD;  Location: [place];  Service: Orthopedics;  Laterality: N/A;   JOINT REPLACEMENT     KNEE ARTHROSCOPY     LUMBAR WOUND DEBRIDEMENT N/A 03/30/2021   Procedure: REPEAT LUMBAR WOUND DEBRIDEMENT;  Surgeon: [name], MD;  Location: [place];  Service: Orthopedics;  Laterality: N/A;   right rotator cuff     ROTATOR CUFF REPAIR Left    TOTAL KNEE ARTHROPLASTY Left 09/08/2020   Procedure: LEFT TOTAL KNEE ARTHROPLASTY;  Surgeon: Leandrew Koyanagi, MD;  Location: [place];  Service: Orthopedics;  Laterality: Left;   TUBAL LIGATION     Social History   Occupational History   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

## 2021-12-15 ENCOUNTER — Ambulatory Visit: Payer: Commercial Managed Care - PPO | Admitting: Internal Medicine

## 2021-12-16 ENCOUNTER — Other Ambulatory Visit: Payer: Self-pay | Admitting: Internal Medicine

## 2021-12-19 ENCOUNTER — Other Ambulatory Visit: Payer: Self-pay | Admitting: Physical Medicine and Rehabilitation

## 2021-12-21 ENCOUNTER — Encounter: Payer: Self-pay | Admitting: Internal Medicine

## 2021-12-21 ENCOUNTER — Encounter: Payer: Commercial Managed Care - PPO | Admitting: Physical Medicine and Rehabilitation

## 2021-12-21 ENCOUNTER — Ambulatory Visit: Payer: Commercial Managed Care - PPO | Admitting: Internal Medicine

## 2021-12-21 ENCOUNTER — Encounter: Payer: Self-pay | Admitting: Physical Medicine and Rehabilitation

## 2021-12-21 ENCOUNTER — Other Ambulatory Visit: Payer: Self-pay

## 2021-12-21 VITALS — BP 118/79 | HR 71 | Ht 62.0 in | Wt 198.8 lb

## 2021-12-21 VITALS — BP 126/76 | HR 51 | Temp 98.1°F | Wt 199.0 lb

## 2021-12-21 DIAGNOSIS — M25561 Pain in right knee: Secondary | ICD-10-CM

## 2021-12-21 DIAGNOSIS — T847XXD Infection and inflammatory reaction due to other internal orthopedic prosthetic devices, implants and grafts, subsequent encounter: Secondary | ICD-10-CM

## 2021-12-21 DIAGNOSIS — M25511 Pain in right shoulder: Secondary | ICD-10-CM | POA: Diagnosis not present

## 2021-12-21 DIAGNOSIS — M6281 Muscle weakness (generalized): Secondary | ICD-10-CM | POA: Diagnosis not present

## 2021-12-21 DIAGNOSIS — F4321 Adjustment disorder with depressed mood: Secondary | ICD-10-CM

## 2021-12-21 DIAGNOSIS — Z91018 Allergy to other foods: Secondary | ICD-10-CM | POA: Insufficient documentation

## 2021-12-21 DIAGNOSIS — M797 Fibromyalgia: Secondary | ICD-10-CM | POA: Insufficient documentation

## 2021-12-21 DIAGNOSIS — T847XXS Infection and inflammatory reaction due to other internal orthopedic prosthetic devices, implants and grafts, sequela: Secondary | ICD-10-CM

## 2021-12-21 DIAGNOSIS — Z981 Arthrodesis status: Secondary | ICD-10-CM

## 2021-12-21 DIAGNOSIS — G8929 Other chronic pain: Secondary | ICD-10-CM | POA: Insufficient documentation

## 2021-12-21 DIAGNOSIS — R109 Unspecified abdominal pain: Secondary | ICD-10-CM | POA: Insufficient documentation

## 2021-12-21 NOTE — Progress Notes (Signed)
Subjective:    Patient ID: Amy Hooper, female    DOB: [birth year], 71 y.o.   MRN: [id number]  HPI Female with past medical history/past surgical history of OSA, morbid obesity, hypertension, fibromyalgia, depression, gout, CKD, degenerative disc disease-lumbar, bilateral knee surgeries for torn mensici, bilateral shoulder surgeries for rotator cuff presents with bilateral L > R knee pain and wound-related pain  1) Wound related pain -she is having severe pain associated with her wound vac dressing changes -she has been taking 2 hydrocodone per day and this allows her to function but she states her surgeon is no longer willing to prescribe for her -she asks about alternative pain medications she can take -she says her surgeon says her wound vac may be able to be removed at the end of March.  She cannot sit too long because this aggravates the pain.   2) Knee pain: -she had relief from synvisc -she has terrible pain during the night -patches and the gel do not help.  Initially stated: Started ~1990. After a fall on her knees.  Progressively getting worse.  Steroid injections improve the pain along with brace.  Ambulation exacerbates the pain.  Moving after prolonged postures exacerbates the pain.  Achy.  Radiates down leg.  Intermittent. Denies associated weakness, numbness.  Tylenol. 1 fall summer of 2021 falling backward in chair.  Pain limits ambulation.  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 She has had bilateral L4-5 lumbar epidural steroid injections.  She has also had facet injections.  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  Irritation of S1 nerve roots.  She had a left knee steroid injection on 03/18/2020 with Ortho.  She is seen rheumatology as well.  No reviewed from rheumatology, orthopedic surgeon, physiatrist, neurology-plan for knee  replacement in December 2022. She does note that she is improving with weight loss and dietary changes.   3) Hand pain -spiked since she has been off Celebrex due to her kidney injury -she was recommended to take Qunidine but she can't take because of the antibiotics which she is on for a year due to her wound in her back.  -percocet does not help with the pain as much as the hydrocodone.   4) AKI -improved since off Celebrex  5) Depression:  -she feels miserable.  -she was started on Cymbalta. This did help with the pain in her legs.  -she is not sure if she needs to see anyone for grief  6) Insomnia: -not sleeping well.  -she cannot sleep without Lunesta -she also takes Amitriptyline, Lyrica, and Cymbalta- when she takes all these she does not get up until 11am the next day.     She saw pain management since that time and is scheduled for MBB.  Since last visit, she states she has had great improvement with pool therapy. She states she is hungry. She had good benefits with Lyrica. She has not seen Nephro yet. She is awaiting CPAP.  She has bad fibromyalgia flares when the weather changes- especially when it rains. She was started on Neurontin by Dr. Posey Pronto and it does help on a daily basis. She had relief with Celebrex well before but had to stop due to CKD. She has never tried steroids.   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   Situational depression: She is going through changes  with her family. They are all living in her house and everyone is arguing. She does not want to get herself worked up due to her hypertension. Her family brings her in a lot. Her husband says why don't you say anything. She felt a car coming across her yesterday and she screamed. She feels overwhelmed. Her mother may have undiagnosed mental illness. Her mom called th police on her husband.   Severe arthritis and bulging discs: -she has a lot of pain and nothing helps it. If she sits too long it hurts and if she walks  to long it hurts -Lyrica and steroids help her pain -she is planing to have surgery.   Weight gain  Not voiding as much as she used to -check Creatinine today.    Pain Inventory Average Pain 8 Pain Right Now 8 My pain is intermittent, constant, and sharp, stabbing  In the last 24 hours, has pain interfered with the following? General activity 8 Relation with others 9 Enjoyment of life 10 What TIME of day is your pain at its worst? morning  and daytime Sleep (in general) Fair  Pain is worse with: walking, bending, and standing Pain improves with: rest and medication Relief from Meds: good   Family History  Problem Relation Age of Onset   Hypothyroidism Mother    Hyperlipidemia Other    Kidney failure Father    Dementia Father    Gout Father    Arthritis Father    Prostate cancer Father    Social History   Socioeconomic History   Marital status: Married    Spouse name: Not on file   Number of children: 3   Years of education: Not on file   Highest education level: Bachelor's degree (e.g., BA, AB, BS)  Occupational History   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     Social Determinants of Health   Financial Resource Strain: Low Risk  (03/01/2021)   Overall Financial Resource Strain (CARDIA)    Difficulty of Paying Living Expenses: Not hard at all  Food Insecurity: No Food Insecurity (03/01/2021)   Hunger Vital Sign    Worried About Running Out of Food in the Last Year: Never true    Ran Out of Food in the Last Year: Never true  Transportation Needs: No Transportation Needs (03/01/2021)   PRAPARE - Armed forces logistics/support/administrative officer (Medical): No    Lack of Transportation (Non-Medical): No  Physical Activity: Unknown (03/01/2021)   Exercise Vital Sign    Days of Exercise per Week: 0 days    Minutes of Exercise per Session: Not on file  Stress: No Stress Concern Present (03/01/2021)   [place]    Feeling of Stress : Only a little  Social Connections: Moderately Isolated (03/01/2021)   Social Connection and Isolation Panel [NHANES]    Frequency of Communication with Friends and Family: More than three times a week    Frequency of Social Gatherings with Friends and Family: Once a week    Attends  Religious Services: Never    Active Member of Clubs or Organizations: No    Attends Archivist Meetings: Not on file    Marital Status: Married   Past Surgical History:  Procedure Laterality Date   APPLICATION OF WOUND VAC N/A 03/25/2021   Procedure: APPLICATION OF WOUND VAC;  Surgeon: [name], MD;  Location: [facility];  Service: Orthopedics;  Laterality: N/A;   APPLICATION OF WOUND VAC N/A 03/30/2021   Procedure: WOUND VAC CHANGE 12x6x5;  Surgeon: [name], MD;  Location: [facility];  Service: Orthopedics;  Laterality: N/A;   INCISION AND DRAINAGE OF WOUND N/A 03/25/2021   Procedure: LUMBAR POST OP INCISION IRRIGATION;  Surgeon: [name], MD;  Location: [facility];  Service: Orthopedics;  Laterality: N/A;   JOINT REPLACEMENT     KNEE ARTHROSCOPY     LUMBAR WOUND DEBRIDEMENT N/A 03/30/2021   Procedure: REPEAT LUMBAR WOUND DEBRIDEMENT;  Surgeon: [name], MD;  Location: [facility];  Service: Orthopedics;  Laterality: N/A;   right rotator cuff     ROTATOR CUFF REPAIR Left    TOTAL KNEE ARTHROPLASTY Left 09/08/2020   Procedure: LEFT TOTAL KNEE ARTHROPLASTY;  Surgeon: Leandrew Koyanagi, MD;  Location: [facility];  Service: Orthopedics;  Laterality: Left;   TUBAL LIGATION     Past Medical History:  Diagnosis Date   Chronic kidney  disease    Depression    Fibromyalgia    Gout    HTN (hypertension)    Insomnia    Morbid obesity ([place])    Osteoarthritis    Palpitation    Sleep apnea    There were no vitals taken for this visit.  Opioid Risk Score:   Fall Risk Score:  `1  Depression screen [facility] 2/9     11/03/2021   10:30 AM 09/02/2021    2:06 PM 08/06/2021   11:25 AM 06/11/2021   11:18 AM 05/19/2021   10:55 AM 04/14/2021   10:24 AM 03/05/2021    7:52 AM  Depression screen PHQ 2/9  Decreased Interest 0 1 2 0 1 0 0  Down, Depressed, Hopeless 0 1 2 0 3 0 0  PHQ - 2 Score 0 2 4 0 4 0 0  Altered sleeping     3  0  Tired, decreased energy     2  0  Change in appetite     2  0  Feeling bad or failure about yourself      0  0  Trouble concentrating     0  0  Moving slowly or fidgety/restless     0  0  Suicidal thoughts     0  0  PHQ-9 Score     11  0  Difficult doing work/chores       Not difficult at all   Review of Systems  Constitutional: Negative.   HENT: Negative.    Eyes: Negative.   Respiratory: Negative.    Cardiovascular: Negative.   Gastrointestinal: Negative.   Endocrine: Negative.   Genitourinary: Negative.   Musculoskeletal:  Positive for arthralgias, back pain and gait problem. Negative for joint swelling, myalgias, neck pain and neck stiffness.       Pain in both legs  Allergic/Immunologic: Negative.   Hematological: Negative.   All other systems reviewed and are negative.    Objective:   Physical Exam  Gen: no distress, normal appearing HEENT: oral mucosa pink and moist, NCAT Cardio: Reg rate Chest:  normal effort, normal rate of breathing Abd: soft, non-distended Ext: no edema Psych: pleasant, normal affect Skin: intact Neuro: Alert and oriented x3    Assessment & Plan:  Female with past medical history/past surgical history of OSA, morbid obesity, hypertension, fibromyalgia, depression, gout, CKD, degenerative disc disease-lumbar, bilateral knee surgeries for torn mensici,  bilateral shoulder surgeries for rotator cuff presents with bilateral L > R knee pain.   1. Bilateral knee OA - endstage  Patient was supposed to have knee replacements last year, but due to insurance changed to summer 2022  Endstage OA in b/l knee per Ortho, no films available  No benefit with Heat/Cold, PT (~2019), Robaxin, Flexaril, Tizanidine  Continue bracing prn (limit on right knee - educated)  Continue Voltaren gel  Lidocaine 5% patch ordered  -Provided with a pain relief journal and discussed that it contains foods and lifestyle tips to naturally help to improve pain. Discussed that these lifestyle strategies are also very good for health unlike some medications which can have negative side effects. Discussed that the act of keeping a journal can be therapeutic and helpful to realize patterns what helps to trigger and alleviate pain.    Decreased Cymbalta to '20mg'$  daily.   Patient states main goal is to ambulate  Wants to buy a pool - afraid of public locations due to CoVid  Recommended follow up with Nephro regarding Chondroitin sulfate, appointment next month  Tolerating mild exercise - limited by back   Continue antiinflammatory diet -Discussed following foods that may reduce pain: 1) Ginger (especially studied for arthritis)- reduce leukotriene production to decrease inflammation 2) Blueberries- high in phytonutrients that decrease inflammation 3) Salmon- marine omega-3s reduce joint swelling and pain 4) Pumpkin seeds- reduce inflammation 5) dark chocolate- reduces inflammation 6) turmeric- reduces inflammation 7) tart cherries - reduce pain and stiffness 8) extra virgin olive oil - its compound olecanthal helps to block prostaglandins  9) chili peppers- can be eaten or applied topically via capsaicin 10) mint- helpful for headache, muscle aches, joint pain, and itching 11) garlic- reduces inflammation  Link to further information on diet for chronic pain:  [email or web address].randall.[email or web address]   2. Sleep disturbance  Continue Lunesta per PCP  Continue to await CPAP -Try to go outside near sunrise -Get exercise during the day.  -Turn off all devices an hour before bedtime.  -Teas that can benefit: chamomile, valerian root, Brahmi (Bacopa) -Can consider over the counter melatonin or magnesium glycinate (latter can also help with constipation) -Pistachios naturally increase the production of melatonin  -start amitriptyline '10mg'$  HS    3. Morbid Obesity  Continue follow up with dietitian  Encouraged weight loss  4. Myalgia   Will consider trigger point injections  5. Fibromyalgia  Prescribed aquatic therapy - had great benefit  Encouraged ROM, stretching  Continue Cymbalta  Decrease Lyrica to '50mg'$  TID  6) Spinal surgical hardware infection -discussed plan to wean off Percocet -will continue on one year of antibiotics -Discussed that the hydrocodone was helping with wound related pain which is still horrible. Will get UDS and pain contact today.  -discussed maximizing tylenol which she is already doing  -avoid NSAIDs due to only one kidney present -discussed wean to one, then no opioids after wound vac removal  7) depression -discussed taking amitriptyline earlier in the evening -referred to neuropsych and psychology -discussed family stressors -recommended eating saurkraut -discussed kombucha, yogurt, and kefir.  -Discussed her desire to return to work.  -discussed with neuropsychological  -continue probiotic/prebiotic

## 2021-12-21 NOTE — Progress Notes (Signed)
Patient Active Problem List   Diagnosis Date Noted   Acute on chronic renal insufficiency 03/27/2021   Postoperative complication of skin involving drainage from surgical wound 03/25/2021   Post op infection 03/24/2021   S/P lumbar fusion 03/24/2021   Lumbar stenosis 03/09/2021   Degenerative spondylolisthesis 02/19/2021   Primary osteoarthritis of left knee 09/08/2020   DJD (degenerative joint disease) of knee 09/08/2020   Status post total left knee replacement 09/08/2020   Spondylosis without myelopathy or radiculopathy, lumbar region 07/17/2020   Chronic pain syndrome 07/17/2020   Myalgia 07/17/2020   OSA (obstructive sleep apnea) 05/19/2020   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/17/2020   Sleep disturbance 04/17/2020   Chronic pain of both shoulders 03/03/2020   Pain in both hands 03/03/2020   Primary osteoarthritis of both knees 03/03/2020   Pain in both feet 03/03/2020   History of chronic kidney disease 03/03/2020   DDD (degenerative disc disease), lumbar 02/05/2020   Vitamin D deficiency 01/09/2020   Hyperlipidemia 01/09/2020   CKD (chronic kidney disease) stage 3, GFR 30-59 ml/min ([facility]) 01/09/2020   HTN (hypertension)    Morbid obesity ([facility])    Gout    Fibromyalgia    Depression    Insomnia     Patient's Medications  New Prescriptions   No medications on file  Previous Medications   ALLOPURINOL (ZYLOPRIM) 100 MG TABLET    Take 2 tablets (200 mg total) by mouth daily.   AMITRIPTYLINE (ELAVIL) 10 MG TABLET    TAKE 1 TABLET BY MOUTH EVERYDAY AT BEDTIME   AMLODIPINE (NORVASC) 5 MG TABLET    TAKE 1 TABLET (5 MG TOTAL) BY MOUTH DAILY.   AMOXICILLIN-CLAVULANATE (AUGMENTIN) 500-125 MG TABLET    Take 1 tablet (500 mg total) by mouth in the morning and at bedtime.   ATORVASTATIN (LIPITOR) 10 MG TABLET    TAKE 1 TABLET BY MOUTH EVERYDAY AT BEDTIME   DULOXETINE (CYMBALTA) 20 MG CAPSULE    TAKE 1 CAPSULE BY MOUTH DAILY   ESZOPICLONE (LUNESTA) 2  MG TABS TABLET    TAKE 1 TABLET BY MOUTH AT NIGHT IMMEDIATELY BEFORE BEDTIME   FLUTICASONE (FLONASE) 50 MCG/ACT NASAL SPRAY    Place 1 spray into both nostrils daily as needed for allergies.   HYDROCODONE-ACETAMINOPHEN (NORCO) 7.5-325 MG TABLET    Take 1 tablet by mouth 2 (two) times daily as needed for moderate pain. Do Not Fill Before 12/12/2021   HYDROCORTISONE (ANUSOL-HC) 2.5 % RECTAL CREAM    Place 1 Application rectally 2 (two) times daily.   LEVOFLOXACIN (LEVAQUIN) 500 MG TABLET    Take 1 tablet (500 mg total) by mouth daily.   LORATADINE (CLARITIN) 10 MG TABLET    Take 10 mg by mouth daily as needed for allergies.   LOSARTAN (COZAAR) 50 MG TABLET    TAKE 1 TABLET BY MOUTH EVERY DAY   PREGABALIN (LYRICA) 75 MG CAPSULE    Take 1 capsule (75 mg total) by mouth 3 (three) times daily.   VITAMIN D, ERGOCALCIFEROL, (DRISDOL) 1.25 MG (50000 UNIT) CAPS CAPSULE    TAKE 1 CAPSULE (50,000 UNITS TOTAL) BY MOUTH EVERY 7 (SEVEN) DAYS  Modified Medications   No medications on file  Discontinued Medications   No medications on file    Subjective: 71 year old female with obesity, CKD, L4-L5 stenosis anterolisthesis status post L4-5 transforaminal lumbar interbody fusion with pedicle instrumentation cage lateral fusion right L5-S1 microdiscectomy on 03/09/2021.  Patient developed  drainage from surgical site infection hospitalized underwent I&D(03/25/21) of lumbar incision.  OR cultures positive for Enterobacter aeruginosa, Morganella and Bacteroides fragilis.  She underwent repeat debridement in the OR due to purulent drainage on 03/30/21. Patient was initially on cefazolin transitioned to cefpime then metronidazole added.  Plan was to complete 2 weeks of IV therapy with cefepime and metronidazole (04/14/21) then transition to levaquin and metro to complete 8 weeks of antibiotics from OR on 03/30/21. She does have retained hardware as suuch will need suppressive antibiotics with metro and levaquin.  Interval 1/24:  Changed to levaquin and metronidazole 05/19/21:Wound vac in place. Followed by Dr. Ames Coupe). Pt reports tolerating levaquin and metronidazole.  06/11/21: no new complaints 07/30/21: Presents  due to GI symptoms. Pt has loss of appetitie, loose stools 5-6 times/day. Loose stools started  in the beginning of April. Pt has been taking percocet PRN less often since April. She notices stools are more formed with percocet. Denies, fever, chills, N,V. Pt had been on probiotics, but has not been taking them since mid April.  She is on levaquin 25 mg PO qd for chronic suppression. Seen by Dr. Lorin Mercy on 4/26 and wound noted to be completely healed. Wound vac off.  8/15: reprots muscl epain throughout her body. Today 12/21/21: Pt she has back pain with certain activities. Dneis fever or chills. Reports no issues with tolerating suppresive antibiotics.    Review of Systems: ROS  Past Medical History:  Diagnosis Date   Chronic kidney disease    Depression    Fibromyalgia    Gout    HTN (hypertension)    Insomnia    Morbid obesity ([facility])    Osteoarthritis    Palpitation    Sleep apnea     Social History   Tobacco Use   Smoking status: Never    Passive exposure: Current   Smokeless tobacco: Never  Vaping Use   Vaping Use: Never used  Substance Use Topics   Alcohol use: Never    Alcohol/week: 0.0 standard drinks of alcohol   Drug use: Never    Family History  Problem Relation Age of Onset   Hypothyroidism Mother    Hyperlipidemia Other    Kidney failure Father    Dementia Father    Gout Father    Arthritis Father    Prostate cancer Father     Allergies  Allergen Reactions   Cefuroxime Axetil Rash and Hives   Clarithromycin Rash and Hives    Other reaction(s): Unknown   Nsaids Nausea And Vomiting    Other reaction(s): Unknown   Cefuroxime     Other reaction(s): Unknown   Robaxin [Methocarbamol] Nausea And Vomiting    "It tears my stomach up."   Toradol [Ketorolac  Tromethamine] Nausea And Vomiting   Tramadol Nausea And Vomiting and Other (See Comments)    Upset stomach Other reaction(s): Unknown    Health Maintenance  Topic Date Due   Hepatitis C Screening  Never done   Zoster Vaccines- Shingrix (1 of 2) Never done   COVID-19 Vaccine (4 - Moderna risk series) 03/19/2020   INFLUENZA VACCINE  10/20/2021   MAMMOGRAM  01/01/2023   COLONOSCOPY (Pts 45-60yr Insurance coverage will need to be confirmed)  01/04/2024   TETANUS/TDAP  01/07/2030   Pneumonia Vaccine 71 Years old  Completed   DEXA SCAN  Completed   HPV VACCINES  Aged Out    Objective:  Vitals:   12/21/21 1522  BP: 126/76  Pulse: (!) 51  Temp: 98.1 F (36.7 C)  TempSrc: Oral  SpO2: 96%  Weight: 199 lb (90.3 kg)   Body mass index is 36.4 kg/m.  Physical Exam  Lab Results Lab Results  Component Value Date   WBC 6.0 11/03/2021   HGB 12.1 11/03/2021   HCT 35.1 11/03/2021   MCV 87.5 11/03/2021   PLT 283 11/03/2021    Lab Results  Component Value Date   CREATININE 1.21 (H) 11/03/2021   BUN 19 11/03/2021   NA 138 11/03/2021   K 4.2 11/03/2021   CL 106 11/03/2021   CO2 25 11/03/2021    Lab Results  Component Value Date   ALT 5 (L) 11/03/2021   AST 11 11/03/2021   ALKPHOS 130 (H) 03/30/2021   BILITOT 0.5 11/03/2021    Lab Results  Component Value Date   CHOL 169 09/03/2020   HDL 70.70 09/03/2020   LDLCALC 90 09/03/2020   TRIG 42.0 09/03/2020   CHOLHDL 2 09/03/2020   No results found for: "LABRPR", "RPRTITER" No results found for: "HIV1RNAQUANT", "HIV1RNAVL", "CD4TABS"   A/P #Lumbar wound infection with retained hardware SP I&D on 03/25/21  #OR Cx from 03/25/21 +  enterobacter aeruginosa, Morganella morganii,  bacteroides fragilis(beta lactamase positive) -Completed 2 weeks of cefepime from OR on 1/24 and started on levaquin 750m PO q48h(renally dosed) and  metronidazole 504mPO bid(EOT 05/25/21). Then continued on levaquin and metronidazole for chronic  suppression. Metronidazole changed to augmentin due to muscle soreness. Normal CK on 8/15 -labs 8/15 16 crp, esr 43, scr 1.21 Plan: -labs today -Continue  augmentin 50065mO bid and levaquin 500 per day(increased due to improved renal clearance)   -At next visit in march, 2024(1 year from competing  treatment) will disussing stopping suppression, pending labs , radiographic and clinical progression. I discussed with pt that she is on suppressive regimen, completed treatment regimen. Suppressive antibiotics are needed as hardware is in place which could harbor bacteria.   #Diarrhea, likely antibiotic associated -Improved with probiotics     MayLaurice RecordD RegSan Juanr Infectious Disease Con[facility] 12/21/2021, 3:40 PM

## 2021-12-22 LAB — COMPLETE METABOLIC PANEL WITH GFR
AG Ratio: 1.5 (calc) (ref 1.0–2.5)
ALT: 18 U/L (ref 6–29)
AST: 22 U/L (ref 10–35)
Albumin: 4.3 g/dL (ref 3.6–5.1)
Alkaline phosphatase (APISO): 94 U/L (ref 37–153)
BUN/Creatinine Ratio: 13 (calc) (ref 6–22)
BUN: 15 mg/dL (ref 7–25)
CO2: 29 mmol/L (ref 20–32)
Calcium: 10 mg/dL (ref 8.6–10.4)
Chloride: 104 mmol/L (ref 98–110)
Creat: 1.15 mg/dL — ABNORMAL HIGH (ref 0.50–1.05)
Globulin: 2.9 g/dL (calc) (ref 1.9–3.7)
Glucose, Bld: 79 mg/dL (ref 65–99)
Potassium: 4.3 mmol/L (ref 3.5–5.3)
Sodium: 142 mmol/L (ref 135–146)
Total Bilirubin: 0.5 mg/dL (ref 0.2–1.2)
Total Protein: 7.2 g/dL (ref 6.1–8.1)
eGFR: 52 mL/min/{1.73_m2} — ABNORMAL LOW (ref >=60)

## 2021-12-22 LAB — CBC WITH DIFFERENTIAL/PLATELET
Absolute Monocytes: 343 cells/uL (ref 200–950)
Basophils Absolute: 19 cells/uL (ref 0–200)
Basophils Relative: 0.4 %
Eosinophils Absolute: 371 cells/uL (ref 15–500)
Eosinophils Relative: 7.9 %
HCT: 37.9 % (ref 35.0–45.0)
Hemoglobin: 12.4 g/dL (ref 11.7–15.5)
Lymphs Abs: 1734 cells/uL (ref 850–3900)
MCH: 29.7 pg (ref 27.0–33.0)
MCHC: 32.7 g/dL (ref 32.0–36.0)
MCV: 90.9 fL (ref 80.0–100.0)
MPV: 9.7 fL (ref 7.5–12.5)
Monocytes Relative: 7.3 %
Neutro Abs: 2233 cells/uL (ref 1500–7800)
Neutrophils Relative %: 47.5 %
Platelets: 304 10*3/uL (ref 140–400)
RBC: 4.17 10*6/uL (ref 3.80–5.10)
RDW: 13.2 % (ref 11.0–15.0)
Total Lymphocyte: 36.9 %
WBC: 4.7 10*3/uL (ref 3.8–10.8)

## 2021-12-22 LAB — SEDIMENTATION RATE: Sed Rate: 29 mm/h (ref 0–30)

## 2021-12-22 LAB — C-REACTIVE PROTEIN: CRP: 3.5 mg/L (ref <8.0)

## 2021-12-23 ENCOUNTER — Encounter: Payer: Self-pay | Admitting: Physical Medicine and Rehabilitation

## 2021-12-24 ENCOUNTER — Ambulatory Visit (HOSPITAL_BASED_OUTPATIENT_CLINIC_OR_DEPARTMENT_OTHER): Payer: Commercial Managed Care - PPO | Attending: Physical Medicine and Rehabilitation | Admitting: Physical Therapy

## 2021-12-24 ENCOUNTER — Encounter (HOSPITAL_BASED_OUTPATIENT_CLINIC_OR_DEPARTMENT_OTHER): Payer: Self-pay | Admitting: Physical Therapy

## 2021-12-24 DIAGNOSIS — M25561 Pain in right knee: Secondary | ICD-10-CM | POA: Diagnosis not present

## 2021-12-24 DIAGNOSIS — M25511 Pain in right shoulder: Secondary | ICD-10-CM | POA: Insufficient documentation

## 2021-12-24 DIAGNOSIS — F4321 Adjustment disorder with depressed mood: Secondary | ICD-10-CM | POA: Insufficient documentation

## 2021-12-24 DIAGNOSIS — R109 Unspecified abdominal pain: Secondary | ICD-10-CM | POA: Insufficient documentation

## 2021-12-24 DIAGNOSIS — M6281 Muscle weakness (generalized): Secondary | ICD-10-CM | POA: Insufficient documentation

## 2021-12-24 DIAGNOSIS — Z91018 Allergy to other foods: Secondary | ICD-10-CM | POA: Insufficient documentation

## 2021-12-24 DIAGNOSIS — M797 Fibromyalgia: Secondary | ICD-10-CM | POA: Insufficient documentation

## 2021-12-24 DIAGNOSIS — G8929 Other chronic pain: Secondary | ICD-10-CM | POA: Insufficient documentation

## 2021-12-24 NOTE — Therapy (Signed)
OUTPATIENT PHYSICAL THERAPY LOWER EXTREMITY EVALUATION   Patient Name: Amy Hooper MRN: [id number] DOB:[birth year], 71 y.o., female Today's Date: 12/24/2021   PT End of Session - 12/24/21 1341     Visit Number 1    Number of Visits 16    Date for PT Re-Evaluation 02/18/22    Authorization Type UHC    PT Start Time 1205    PT Stop Time 1250    PT Time Calculation (min) 45 min    Activity Tolerance Patient tolerated treatment well    Behavior During Therapy WFL for tasks assessed/performed             Past Medical History:  Diagnosis Date   Chronic kidney disease    Depression    Fibromyalgia    Gout    HTN (hypertension)    Insomnia    Morbid obesity ([place])    Osteoarthritis    Palpitation    Sleep apnea    Past Surgical History:  Procedure Laterality Date   APPLICATION OF WOUND VAC N/A 03/25/2021   Procedure: APPLICATION OF WOUND VAC;  Surgeon: [name], MD;  Location: [place];  Service: Orthopedics;  Laterality: N/A;   APPLICATION OF WOUND VAC N/A 03/30/2021   Procedure: WOUND VAC CHANGE 12x6x5;  Surgeon: [name], MD;  Location: [place];  Service: Orthopedics;  Laterality: N/A;   INCISION AND DRAINAGE OF WOUND N/A 03/25/2021   Procedure: LUMBAR POST OP INCISION IRRIGATION;  Surgeon: [name], MD;  Location: [place];  Service: Orthopedics;  Laterality: N/A;   JOINT REPLACEMENT     KNEE ARTHROSCOPY     LUMBAR WOUND DEBRIDEMENT N/A 03/30/2021   Procedure: REPEAT LUMBAR WOUND DEBRIDEMENT;  Surgeon: [name], MD;  Location: [place];  Service: Orthopedics;  Laterality: N/A;   right rotator cuff     ROTATOR CUFF REPAIR Left    TOTAL KNEE ARTHROPLASTY Left 09/08/2020   Procedure: LEFT TOTAL KNEE ARTHROPLASTY;  Surgeon: Leandrew Koyanagi, MD;  Location: [place];  Service: Orthopedics;  Laterality: Left;   TUBAL LIGATION     Patient Active Problem List   Diagnosis Date Noted   Acute on chronic renal insufficiency 03/27/2021   Postoperative complication of skin  involving drainage from surgical wound 03/25/2021   Post op infection 03/24/2021   S/P lumbar fusion 03/24/2021   Lumbar stenosis 03/09/2021   Degenerative spondylolisthesis 02/19/2021   Primary osteoarthritis of left knee 09/08/2020   DJD (degenerative joint disease) of knee 09/08/2020   Status post total left knee replacement 09/08/2020   Spondylosis without myelopathy or radiculopathy, lumbar region 07/17/2020   Chronic pain syndrome 07/17/2020   Myalgia 07/17/2020   OSA (obstructive sleep apnea) 05/19/2020   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/17/2020   Sleep disturbance 04/17/2020   Chronic pain of both shoulders 03/03/2020   Pain in both hands 03/03/2020   Primary osteoarthritis of both knees 03/03/2020   Pain in both feet 03/03/2020   History of chronic kidney disease 03/03/2020   DDD (degenerative disc disease), lumbar 02/05/2020   Vitamin D deficiency 01/09/2020   Hyperlipidemia 01/09/2020   CKD (chronic kidney disease) stage 3, GFR 30-59 ml/min ([place]) 01/09/2020   HTN (hypertension)    Morbid obesity ([place])    Gout    Fibromyalgia    Depression    Insomnia     PCP: Isaac Bliss, [name], MD  REFERRING PROVIDER: Izora Ribas, MD  REFERRING DIAG: [phone] (ICD-10-CM) - Chronic  pain of right knee  THERAPY DIAG:  Chronic pain of right knee  Fibromyalgia  Muscle weakness-general  Chronic right shoulder pain  Rationale for Evaluation and Treatment Rehabilitation  ONSET DATE: Since 2018  SUBJECTIVE:   SUBJECTIVE STATEMENT: Left TKR x ~3 years ago with good results, Dec 2022 back surgery with complication of infection, on prophylactic dose of antibiotics x 1 year as per infectious disease will end in 4/24 then is planning on r TKE.  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 Yes: NPRS scale: 6/10 Pain location: genralized Pain description: ache Aggravating factors: movement or stillness Relieving factors: hydrocodone  Right knee:max 10/10 at night prior to bed  PRECAUTIONS: Knee and Back  WEIGHT BEARING RESTRICTIONS No  FALLS:  Has patient fallen in last 6 months? 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 XR right knee: Moderately advanced tricompartmental DJD.  PATIENT SURVEYS:  FOTO 36 with goal of 73  COGNITION:  Overall cognitive status: Within functional limits for tasks assessed     SENSATION: Perphreal nueropathies right foot   POSTURE:  right valgus deformity   LOWER EXTREMITY ROM:                       Right knee: 0-110  LOWER EXTREMITY MMT:  MMT Right eval Left eval  Hip flexion 4- 4  Hip extension    Hip abduction 4 5  Hip adduction 4 5  Hip internal rotation    Hip external rotation    Knee flexion 4 4+  Knee extension    Ankle dorsiflexion    Ankle plantarflexion    Ankle inversion    Ankle eversion     (Blank rows = not tested)  UE ROM  RIGHT: Abd and flex:full with P! At end range  Flex  str: 3- Elbow 5/5  LEFT: rom wfl.  Str3+/5   FUNCTIONAL TESTS:  3.5 reps30 seconds chair stand test Timed up and go (TUG): 24.27 Berg Balance Scale: 31/56  GAIT: Distance walked: 500 Assistive device utilized: Single point cane Level of assistance: Complete Independence Comments: lateral deviation with decreased right knee flex.  Cane in [place] (needs  to be left), decreased step length and cadence.    TODAY'S TREATMENT: Eval Objective testing Gait training   PATIENT EDUCATION:  Education details: Discussed eval findings, rehab rationale and POC and patient is in agreement Person educated: Patient Education method: Explanation Education comprehension: verbalized understanding   HOME EXERCISE PROGRAM: From previous episode  ASSESSMENT:  CLINICAL IMPRESSION: Patient is a 71 y.o. f who was seen today for physical therapy evaluation and treatment for right knee pain.  Pt with multiple comorbidities including LB fusion with complication of infection continues on prophalactic antibotic.  Has had Left TKR and right shoulder surgery in recent past.  Fibromyalgia x 5 years.  Her pain patterns are difficult to descern specific etiology with fibromyalgia probably being  foremost aggravator.  She has had injection in her right knee and reports no discomfort at this time. R shoulder pain with flex and abd >100d. Then other general body pain (hands, ankles, elbows, cervical spine) 6/10 on daily basis.  She has had aquatic therapy in the past here at [facility] and reports safety and indep submerged.  She lives alone during the week with her husband truck driver home just on weekends. She is a good candidate for aquatic therapy. Plan to progress to land based treatment after ~4 weeks of aquatics.  She does report intent to get membership here at [facility].   OBJECTIVE IMPAIRMENTS Abnormal gait, decreased activity tolerance, decreased balance, decreased mobility, difficulty walking, decreased strength, and pain.   ACTIVITY LIMITATIONS lifting, bending, squatting, stairs, transfers, bed mobility, and locomotion level  PARTICIPATION LIMITATIONS: cleaning, shopping, community activity, and yard work  [place], Past/current experiences, and 1-2 comorbidities: Lumbar dysfunction, OA,   are also affecting patient's functional outcome.    REHAB POTENTIAL: Good  CLINICAL DECISION MAKING: Evolving/moderate complexity  EVALUATION COMPLEXITY: Moderate   GOALS: Goals reviewed with patient? Yes  SHORT TERM GOALS: Target date: 01/21/2022  Pt will initiate access to pool/gym in order to be compliant with final HEP land and aquatic based Baseline:no access Goal status: INITIAL  2.  Pt to be ale to tolerate amb up towards 15 mins to demonstrate improved toleration to activity Baseline: 7 Goal status: INITIAL  3.  Pt will improve on Berg balance test to 40/46 Baseline: 31/56 Goal status: INITIAL  4.  Pt will improve on Right hip and knee flex to 1/2  grade without pain with resistance Baseline: 4- to 4/5 Goal status: INITIAL  LONG TERM GOALS: Target date: 02/18/2022   Pt to meet Foto goal of 49% Baseline: 36 Goal status: INITIAL  2.  Pt to be indep with final HEP Baseline:  Goal status: INITIAL  3.  Pt will improve on 30s STS test to 7 or> to demonstrate improved le strength Baseline: 3.5 Goal status: INITIAL  4.  Pt will improve in bilat shoulder str up to or > 1 full grade Baseline: 3- to 3+/5 Goal status: INITIAL     PLAN: PT FREQUENCY: 1-2x/week  PT DURATION: 8 weeks  PLANNED INTERVENTIONS: Therapeutic exercises, Therapeutic activity, Neuromuscular re-education, Balance training, Gait training, Patient/Family education, Self Care, Joint mobilization, Joint manipulation, Stair training, Aquatic Therapy, Dry Needling, Cryotherapy, Moist heat, Taping, Ultrasound, Manual therapy, and Re-evaluation  PLAN FOR NEXT SESSION: Right le strengthening, core and bilat shoulder strengthening, balance retraining   Denton Meek, PT MPT 12/24/2021, 1:42 PM

## 2021-12-25 LAB — ALLERGENS (22) FOODS IGG
Casein IgG: 2.4 ug/mL — ABNORMAL HIGH (ref 0.0–1.9)
Cheese, Mold Type IgG: 4.6 ug/mL — ABNORMAL HIGH (ref 0.0–1.9)
Chicken IgG: <2 ug/mL (ref 0.0–1.9)
Chili Pepper IgG: 8.4 ug/mL — ABNORMAL HIGH (ref 0.0–1.9)
Chocolate/Cacao IgG: <2 ug/mL (ref 0.0–1.9)
Coffee IgG: 2.2 ug/mL — ABNORMAL HIGH (ref 0.0–1.9)
Corn IgG: 2.8 ug/mL — ABNORMAL HIGH (ref 0.0–1.9)
Egg, Whole IgG: <2 ug/mL (ref 0.0–1.9)
Green Bean IgG: <2 ug/mL (ref 0.0–1.9)
Haddock IgG: <2 ug/mL (ref 0.0–1.9)
Lamb IgG: <2 ug/mL (ref 0.0–1.9)
Oat IgG: 6.4 ug/mL — ABNORMAL HIGH (ref 0.0–1.9)
Onion IgG: 2.5 ug/mL — ABNORMAL HIGH (ref 0.0–1.9)
Peanut IgG: 2.5 ug/mL — ABNORMAL HIGH (ref 0.0–1.9)
Pork IgG: <2 ug/mL (ref 0.0–1.9)
Potato, White, IgG: 2.6 ug/mL — ABNORMAL HIGH (ref 0.0–1.9)
Rye IgG: 5.4 ug/mL — ABNORMAL HIGH (ref 0.0–1.9)
Shrimp IgG: 2.4 ug/mL — ABNORMAL HIGH (ref 0.0–1.9)
Soybean IgG: <2 ug/mL (ref 0.0–1.9)
Tomato IgG: 2.9 ug/mL — ABNORMAL HIGH (ref 0.0–1.9)
Wheat IgG: 3.9 ug/mL — ABNORMAL HIGH (ref 0.0–1.9)
Yeast IgG: 3.3 ug/mL — ABNORMAL HIGH (ref 0.0–1.9)

## 2021-12-27 ENCOUNTER — Other Ambulatory Visit: Payer: Self-pay | Admitting: Physical Medicine and Rehabilitation

## 2021-12-27 ENCOUNTER — Other Ambulatory Visit: Payer: Self-pay | Admitting: Internal Medicine

## 2021-12-27 DIAGNOSIS — M4626 Osteomyelitis of vertebra, lumbar region: Secondary | ICD-10-CM

## 2021-12-29 ENCOUNTER — Ambulatory Visit (HOSPITAL_BASED_OUTPATIENT_CLINIC_OR_DEPARTMENT_OTHER): Payer: Commercial Managed Care - PPO | Admitting: Physical Therapy

## 2021-12-29 ENCOUNTER — Encounter (HOSPITAL_BASED_OUTPATIENT_CLINIC_OR_DEPARTMENT_OTHER): Payer: Self-pay | Admitting: Physical Therapy

## 2021-12-29 DIAGNOSIS — M797 Fibromyalgia: Secondary | ICD-10-CM

## 2021-12-29 DIAGNOSIS — G8929 Other chronic pain: Secondary | ICD-10-CM

## 2021-12-29 DIAGNOSIS — M25561 Pain in right knee: Secondary | ICD-10-CM | POA: Diagnosis not present

## 2021-12-29 DIAGNOSIS — M6281 Muscle weakness (generalized): Secondary | ICD-10-CM

## 2021-12-29 NOTE — Therapy (Signed)
OUTPATIENT PHYSICAL THERAPY LOWER EXTREMITY EVALUATION   Patient Name: Amy Hooper MRN: [id number] DOB:Oct 15, 1950, 71 y.o., female Today's Date: 12/29/2021   PT End of Session - 12/29/21 1623     Visit Number 2    Number of Visits 16    Date for PT Re-Evaluation 02/18/22    Authorization Type UHC    PT Start Time 1620    PT Stop Time 1700    PT Time Calculation (min) 40 min    Activity Tolerance Patient tolerated treatment well    Behavior During Therapy WFL for tasks assessed/performed             Past Medical History:  Diagnosis Date   Chronic kidney disease    Depression    Fibromyalgia    Gout    HTN (hypertension)    Insomnia    Morbid obesity ([place])    Osteoarthritis    Palpitation    Sleep apnea    Past Surgical History:  Procedure Laterality Date   APPLICATION OF WOUND VAC N/A 03/25/2021   Procedure: APPLICATION OF WOUND VAC;  Surgeon: [name], MD;  Location: [place];  Service: Orthopedics;  Laterality: N/A;   APPLICATION OF WOUND VAC N/A 03/30/2021   Procedure: WOUND VAC CHANGE 12x6x5;  Surgeon: [name], MD;  Location: [place];  Service: Orthopedics;  Laterality: N/A;   INCISION AND DRAINAGE OF WOUND N/A 03/25/2021   Procedure: LUMBAR POST OP INCISION IRRIGATION;  Surgeon: [name], MD;  Location: [place];  Service: Orthopedics;  Laterality: N/A;   JOINT REPLACEMENT     KNEE ARTHROSCOPY     LUMBAR WOUND DEBRIDEMENT N/A 03/30/2021   Procedure: REPEAT LUMBAR WOUND DEBRIDEMENT;  Surgeon: [name], MD;  Location: [place];  Service: Orthopedics;  Laterality: N/A;   right rotator cuff     ROTATOR CUFF REPAIR Left    TOTAL KNEE ARTHROPLASTY Left 09/08/2020   Procedure: LEFT TOTAL KNEE ARTHROPLASTY;  Surgeon: Leandrew Koyanagi, MD;  Location: [place];  Service: Orthopedics;  Laterality: Left;   TUBAL LIGATION     Patient Active Problem List   Diagnosis Date Noted   Acute on chronic renal insufficiency 03/27/2021   Postoperative complication of  skin involving drainage from surgical wound 03/25/2021   Post op infection 03/24/2021   S/P lumbar fusion 03/24/2021   Lumbar stenosis 03/09/2021   Degenerative spondylolisthesis 02/19/2021   Primary osteoarthritis of left knee 09/08/2020   DJD (degenerative joint disease) of knee 09/08/2020   Status post total left knee replacement 09/08/2020   Spondylosis without myelopathy or radiculopathy, lumbar region 07/17/2020   Chronic pain syndrome 07/17/2020   Myalgia 07/17/2020   OSA (obstructive sleep apnea) 05/19/2020   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/17/2020   Sleep disturbance 04/17/2020   Chronic pain of both shoulders 03/03/2020   Pain in both hands 03/03/2020   Primary osteoarthritis of both knees 03/03/2020   Pain in both feet 03/03/2020   History of chronic kidney disease 03/03/2020   DDD (degenerative disc disease), lumbar 02/05/2020   Vitamin D deficiency 01/09/2020   Hyperlipidemia 01/09/2020   CKD (chronic kidney disease) stage 3, GFR 30-59 ml/min ([place]) 01/09/2020   HTN (hypertension)    Morbid obesity ([place])    Gout    Fibromyalgia    Depression    Insomnia     PCP: Isaac Bliss, [name], MD  REFERRING PROVIDER: Izora Ribas, MD  REFERRING DIAG: [phone] (ICD-10-CM) - Chronic  pain of right knee  THERAPY DIAG:  Chronic pain of right knee  Fibromyalgia  Muscle weakness-general  Rationale for Evaluation and Treatment Rehabilitation  ONSET DATE: Since 2018  SUBJECTIVE:   "Current: no pain today, it is a good day" SUBJECTIVE STATEMENT: Left TKR x ~3 years ago with good results, Dec 2022 back surgery with complication of infection, on prophylactic dose of antibiotics x 1 year as per infectious disease will end in 4/24 then is planning on r TKE.  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 Yes: NPRS scale: 6/10 Pain location: genralized Pain description: ache Aggravating factors: movement or stillness Relieving factors: hydrocodone  Right knee:max 10/10 at night prior to bed  PRECAUTIONS: Knee and Back  WEIGHT BEARING RESTRICTIONS No  FALLS:  Has patient fallen in last 6 months? 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 XR right knee: Moderately advanced tricompartmental DJD.  PATIENT SURVEYS:  FOTO 36 with goal of 65  COGNITION:  Overall cognitive status: Within functional limits for tasks assessed     SENSATION: Perphreal nueropathies right foot   POSTURE:  right valgus deformity   LOWER EXTREMITY ROM:                       Right knee: 0-110  LOWER EXTREMITY MMT:  MMT Right eval Left eval  Hip flexion 4- 4  Hip extension    Hip abduction 4 5  Hip adduction 4 5  Hip internal rotation    Hip external rotation    Knee flexion 4 4+  Knee extension    Ankle dorsiflexion    Ankle plantarflexion    Ankle inversion    Ankle eversion     (Blank rows = not tested)  UE ROM  RIGHT: Abd and flex:full with P! At end range  Flex  str: 3- Elbow 5/5  LEFT: rom wfl.  Str3+/5   FUNCTIONAL TESTS:  3.5 reps30 seconds chair stand test Timed up and go (TUG): 24.27 Berg Balance Scale: 31/56  GAIT: Distance walked: 500 Assistive device utilized: Single point cane Level of assistance: Complete Independence Comments: lateral deviation with decreased right knee flex.  Cane  in [place] (needs to be left), decreased step length and cadence.    TODAY'S TREATMENT: Pt seen for aquatic therapy today.  Treatment took place in water 3.25-4.5 ft in depth at the [place]. Temp of water was 92.  Pt entered/exited the pool via stairs using alternating pattern with 1 hand rail.  *Walking forward, back and side stepping 3.6 ft multiple reps *Ue supported on wall: hip openers, squats; hip flex str; adductor str; L stretch for lumbar spine; l stretch with hip hiking *STS from bench onto water step. VC and demonstration for execution *Squats on water step x 10 *Step ups forward onto water step x10 leading R/L x10 unsupported *Straddling noodle: cycling  Pt requires the buoyancy and  hydrostatic pressure of water for support, and to offload joints by unweighting joint load by at least 50 % in navel deep water and by at least 75-80% in chest to neck deep water.  Viscosity of the water is needed for resistance of strengthening. Water current perturbations provides challenge to standing balance requiring increased core activation.    PATIENT EDUCATION:  Education details: Discussed eval findings, rehab rationale and POC and patient is in agreement Person educated: Patient Education method: Explanation Education comprehension: verbalized understanding   HOME EXERCISE PROGRAM: From previous episode  ASSESSMENT:  CLINICAL IMPRESSION: Pt demonstrates safety and indep submerged with therapist instructing from deck. Pt with low pain reports today and tolerates entire session without increase in sx. She does require 2 seated recovery periods. Focus on general stretching and strengthening LE and core. Pt given vc and demonstration for execution of exercises. Some initial difficulties with weight shifting to gain immediate standing balance bu improves with practise.  She is a good candidate for aquatic therapy.  Goals on going.  Patient is a 71 y.o. f who was seen today  for physical therapy evaluation and treatment for right knee pain.  Pt with multiple comorbidities including LB fusion with complication of infection continues on prophalactic antibotic.  Has had Left TKR and right shoulder surgery in recent past.  Fibromyalgia x 5 years.  Her pain patterns are difficult to descern specific etiology with fibromyalgia probably being foremost aggravator.  She has had injection in her right knee and reports no discomfort at this time. R shoulder pain with flex and abd >100d. Then other general body pain (hands, ankles, elbows, cervical spine) 6/10 on daily basis.  She has had aquatic therapy in the past here at [facility] and reports safety and indep submerged.  She lives alone during the week with her husband truck driver home just on weekends. She is a good candidate for aquatic therapy. Plan to progress to land based treatment after ~4 weeks of aquatics.  She does report intent to get membership here at [facility].   OBJECTIVE IMPAIRMENTS Abnormal gait, decreased activity tolerance, decreased balance, decreased mobility, difficulty walking, decreased strength, and pain.   ACTIVITY LIMITATIONS lifting, bending, squatting, stairs, transfers, bed mobility, and locomotion level  PARTICIPATION LIMITATIONS: cleaning, shopping, community activity, and yard work  [place], Past/current experiences, and 1-2 comorbidities: Lumbar dysfunction, OA,   are also affecting patient's functional outcome.   REHAB POTENTIAL: Good  CLINICAL DECISION MAKING: Evolving/moderate complexity  EVALUATION COMPLEXITY: Moderate   GOALS: Goals reviewed with patient? Yes  SHORT TERM GOALS: Target date: 01/26/2022  Pt will initiate access to pool/gym in order to be compliant with final HEP land and aquatic based Baseline:no access Goal status: INITIAL  2.  Pt to be ale to tolerate amb up towards 15 mins to demonstrate improved toleration to activity Baseline: 7 Goal status:  INITIAL  3.  Pt will improve on Berg balance test to 40/46 Baseline: 31/56 Goal status: INITIAL  4.  Pt will improve on Right hip and knee flex to 1/2  grade without pain with resistance Baseline: 4- to 4/5 Goal status: INITIAL  LONG TERM GOALS: Target date: 02/23/2022   Pt to meet Foto goal of 49% Baseline: 36 Goal status: INITIAL  2.  Pt to be indep with final HEP Baseline:  Goal status: INITIAL  3.  Pt will improve on 30s STS test to 7 or> to demonstrate improved le strength Baseline: 3.5 Goal status: INITIAL  4.  Pt will improve in bilat shoulder str up to or > 1 full grade Baseline: 3- to 3+/5 Goal status: INITIAL     PLAN: PT FREQUENCY: 1-2x/week  PT DURATION: 8 weeks  PLANNED INTERVENTIONS: Therapeutic exercises, Therapeutic activity, Neuromuscular re-education, Balance training, Gait training, Patient/Family education, Self Care, Joint mobilization, Joint manipulation, Stair training, Aquatic Therapy, Dry Needling, Cryotherapy, Moist heat, Taping, Ultrasound, Manual therapy, and Re-evaluation  PLAN FOR NEXT SESSION: Right le strengthening, core and bilat shoulder strengthening, balance retraining   Denton Meek, PT MPT 12/29/2021, 5:12 PM

## 2021-12-30 ENCOUNTER — Encounter: Payer: Self-pay | Admitting: Rheumatology

## 2021-12-30 NOTE — Telephone Encounter (Signed)
Called patient and left message for her to call us back. 

## 2021-12-30 NOTE — Telephone Encounter (Signed)
Patient has no known history of gout.  Patient will need evaluation.  She can be seen in our office or by Dr. Erlinda Hong.

## 2021-12-30 NOTE — Telephone Encounter (Signed)
Patient states she fractured her toe 5-6 years ago which has resolved. Subsequently she was diagnosed with gout. Patient has been stable for years. Patient states she is now having a flare which started 12/29/2021. Patient states she is taking her Allopurinol daily as prescribed. Please advise.

## 2021-12-30 NOTE — Telephone Encounter (Signed)
Send a prednisone taper starting at 20 mg and taper by 5 mg every 2 days.  Prednisone can increase blood pressure, blood sugar, increase the risk of osteoporosis and heart disease.

## 2021-12-31 ENCOUNTER — Encounter (HOSPITAL_BASED_OUTPATIENT_CLINIC_OR_DEPARTMENT_OTHER): Payer: Commercial Managed Care - PPO | Admitting: Physical Medicine and Rehabilitation

## 2021-12-31 ENCOUNTER — Ambulatory Visit (HOSPITAL_BASED_OUTPATIENT_CLINIC_OR_DEPARTMENT_OTHER): Payer: Commercial Managed Care - PPO | Admitting: Physical Therapy

## 2021-12-31 ENCOUNTER — Encounter (HOSPITAL_BASED_OUTPATIENT_CLINIC_OR_DEPARTMENT_OTHER): Payer: Self-pay | Admitting: Physical Therapy

## 2021-12-31 DIAGNOSIS — R109 Unspecified abdominal pain: Secondary | ICD-10-CM | POA: Diagnosis not present

## 2021-12-31 DIAGNOSIS — Z91018 Allergy to other foods: Secondary | ICD-10-CM

## 2021-12-31 DIAGNOSIS — M797 Fibromyalgia: Secondary | ICD-10-CM

## 2021-12-31 DIAGNOSIS — G8929 Other chronic pain: Secondary | ICD-10-CM

## 2021-12-31 DIAGNOSIS — M25561 Pain in right knee: Secondary | ICD-10-CM | POA: Diagnosis not present

## 2021-12-31 DIAGNOSIS — M6281 Muscle weakness (generalized): Secondary | ICD-10-CM

## 2021-12-31 MED ORDER — PREDNISONE 5 MG PO TABS: ORAL_TABLET | ORAL | 0 refills | Status: DC

## 2021-12-31 NOTE — Addendum Note (Signed)
Addended by: Carole Binning on: 12/31/2021 08:02 AM   Modules accepted: Orders

## 2021-12-31 NOTE — Progress Notes (Signed)
Subjective:    Patient ID: Amy Hooper, female    DOB: 01-10-1951, 71 y.o.   MRN: [id number]  HPI An audio/video tele-health visit is felt to be the most appropriate encounter for this patient at this time. This is a follow up tele-visit via phone. The patient is at home. MD is at office. Prior to scheduling this appointment, our staff discussed the limitations of evaluation and management by telemedicine and the availability of in-person appointments. The patient expressed understanding and agreed to proceed.    Female with past medical history/past surgical history of OSA, morbid obesity, hypertension, fibromyalgia, depression, gout, CKD, degenerative disc disease-lumbar, bilateral knee surgeries for torn mensici, bilateral shoulder surgeries for rotator cuff presents with bilateral L > R knee pain and wound-related pain. She presents for follow-up today regarding her food allergies  1) Wound related pain -she is having severe pain associated with her wound vac dressing changes -she has been taking 2 hydrocodone per day and this allows her to function but she states her surgeon is no longer willing to prescribe for her -she asks about alternative pain medications she can take -she says her surgeon says her wound vac may be able to be removed at the end of March.  She cannot sit too long because this aggravates the pain.   2) Knee pain: -she had relief from synvisc -she has terrible pain during the night -patches and the gel do not help.  Initially stated: Started ~1990. After a fall on her knees.  Progressively getting worse.  Steroid injections improve the pain along with brace.  Ambulation exacerbates the pain.  Moving after prolonged postures exacerbates the pain.  Achy.  Radiates down leg.  Intermittent. Denies associated weakness, numbness.  Tylenol. 1 fall summer of 2021 falling backward in chair.  Pain limits ambulation.  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 She has had bilateral L4-5 lumbar epidural steroid injections.  She has also had facet injections.  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  Irritation of S1 nerve roots.  She had a left knee steroid injection on 03/18/2020 with Ortho.  She is seen rheumatology as well.  No reviewed from rheumatology, orthopedic surgeon, physiatrist, neurology-plan for knee replacement in December 2022. She does note that she is improving with weight loss and dietary changes.   3) Hand pain -spiked since she has been off Celebrex due to her kidney injury -she was recommended to take Qunidine but she can't take because of the antibiotics which she is on for a year due to her wound in her back.  -percocet does not help with the pain as much as the hydrocodone.   4) AKI -improved since off Celebrex  5) Depression:  -she feels miserable.  -she was started on Cymbalta. This did help with the pain in her legs.  -she is not sure if she needs to see anyone for grief  6) Insomnia: -not sleeping well.  -she cannot sleep without Lunesta -she also takes Amitriptyline, Lyrica, and Cymbalta- when she takes all these she does not get up until 11am the next day.  7) Food allergies -she asks about the results of her food allergy testing     She saw pain management since that time and is scheduled for [facility].  Since last visit, she states she has had great  improvement with pool therapy. She states she is hungry. She had good benefits with Lyrica. She has not seen Nephro yet. She is awaiting CPAP.  She has bad fibromyalgia flares when the weather changes- especially when it rains. She was started on Neurontin by Dr. Posey Pronto and it does help on a daily basis. She had relief with Celebrex well before but had to stop due to CKD. She has never tried steroids.   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   Situational  depression: She is going through changes with her family. They are all living in her house and everyone is arguing. She does not want to get herself worked up due to her hypertension. Her family brings her in a lot. Her husband says why don't you say anything. She felt a car coming across her yesterday and she screamed. She feels overwhelmed. Her mother may have undiagnosed mental illness. Her mom called th police on her husband.   Severe arthritis and bulging discs: -she has a lot of pain and nothing helps it. If she sits too long it hurts and if she walks to long it hurts -Lyrica and steroids help her pain -she is planing to have surgery.   Weight gain  Not voiding as much as she used to -check Creatinine today.    Pain Inventory Average Pain 8 Pain Right Now 8 My pain is intermittent, constant, and sharp, stabbing  In the last 24 hours, has pain interfered with the following? General activity 8 Relation with others 9 Enjoyment of life 10 What TIME of day is your pain at its worst? morning  and daytime Sleep (in general) Fair  Pain is worse with: walking, bending, and standing Pain improves with: rest and medication Relief from Meds: good   Family History  Problem Relation Age of Onset   Hypothyroidism Mother    Hyperlipidemia Other    Kidney failure Father    Dementia Father    Gout Father    Arthritis Father    Prostate cancer Father    Social History   Socioeconomic History   Marital status: Married    Spouse name: Not on file   Number of children: 3   Years of education: Not on file   Highest education level: Bachelor's degree (e.g., BA, AB, BS)  Occupational History   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     Social Determinants of Health   Financial Resource Strain: Low Risk  (03/01/2021)   Overall Financial Resource Strain (CARDIA)    Difficulty of Paying Living Expenses: Not hard at all  Food Insecurity: No Food Insecurity (03/01/2021)   Hunger Vital Sign    Worried About Running Out of Food in the Last Year: Never true    Ran Out of Food in the Last Year: Never true  Transportation Needs: No Transportation Needs (03/01/2021)   PRAPARE - Hydrologist (Medical): No    Lack of Transportation (Non-Medical): No  Physical Activity: Unknown (03/01/2021)   Exercise Vital Sign    Days of Exercise  per Week: 0 days    Minutes of Exercise per Session: Not on file  Stress: No Stress Concern Present (03/01/2021)   [place]    Feeling of Stress : Only a little  Social Connections: Moderately Isolated (03/01/2021)   Social Connection and Isolation Panel [NHANES]    Frequency of Communication with Friends and Family: More than three times a week    Frequency of Social Gatherings with Friends and Family: Once a week    Attends Religious Services: Never    Marine scientist or Organizations: No    Attends Music therapist: Not on file    Marital Status: Married   Past Surgical History:  Procedure Laterality Date   APPLICATION OF WOUND VAC N/A 03/25/2021   Procedure: APPLICATION OF WOUND VAC;  Surgeon: [name], MD;  Location: [facility];  Service: Orthopedics;  Laterality: N/A;   APPLICATION OF WOUND VAC N/A 03/30/2021   Procedure: WOUND VAC CHANGE 12x6x5;  Surgeon: [name], MD;  Location: [facility];  Service: Orthopedics;  Laterality: N/A;   INCISION AND DRAINAGE OF WOUND N/A 03/25/2021   Procedure: LUMBAR POST OP INCISION IRRIGATION;  Surgeon: [name], MD;  Location: [facility];   Service: Orthopedics;  Laterality: N/A;   JOINT REPLACEMENT     KNEE ARTHROSCOPY     LUMBAR WOUND DEBRIDEMENT N/A 03/30/2021   Procedure: REPEAT LUMBAR WOUND DEBRIDEMENT;  Surgeon: [name], MD;  Location: [facility];  Service: Orthopedics;  Laterality: N/A;   right rotator cuff     ROTATOR CUFF REPAIR Left    TOTAL KNEE ARTHROPLASTY Left 09/08/2020   Procedure: LEFT TOTAL KNEE ARTHROPLASTY;  Surgeon: Leandrew Koyanagi, MD;  Location: [facility];  Service: Orthopedics;  Laterality: Left;   TUBAL LIGATION     Past Medical History:  Diagnosis Date   Chronic kidney disease    Depression    Fibromyalgia    Gout    HTN (hypertension)    Insomnia    Morbid obesity ([place])    Osteoarthritis    Palpitation    Sleep apnea    There were no vitals taken for this visit.  Opioid Risk Score:   Fall Risk Score:  `1  Depression screen PHQ 2/9     12/21/2021    1:31 PM 11/03/2021   10:30 AM 09/02/2021    2:06 PM 08/06/2021   11:25 AM 06/11/2021   11:18 AM 05/19/2021   10:55 AM 04/14/2021   10:24 AM  Depression screen PHQ 2/9  Decreased Interest 1 0 1 2 0 1 0  Down, Depressed, Hopeless 1 0 1 2 0 3 0  PHQ - 2 Score 2 0 2 4 0 4 0  Altered sleeping      3   Tired, decreased energy      2   Change in appetite      2   Feeling bad or failure about yourself       0   Trouble concentrating      0   Moving slowly or fidgety/restless      0   Suicidal thoughts      0   PHQ-9 Score      11    Review of Systems  Constitutional: Negative.   HENT: Negative.    Eyes: Negative.   Respiratory: Negative.    Cardiovascular: Negative.   Gastrointestinal: Negative.   Endocrine: Negative.  Genitourinary: Negative.   Musculoskeletal:  Positive for arthralgias, back pain and gait problem. Negative for joint swelling, myalgias, neck pain and neck stiffness.       Pain in both legs  Allergic/Immunologic: Negative.   Hematological: Negative.   All other systems reviewed and are negative.     Objective:    Physical Exam  Gen: no distress, normal appearing HEENT: oral mucosa pink and moist, NCAT Cardio: Reg rate Chest: normal effort, normal rate of breathing Abd: soft, non-distended Ext: no edema Psych: pleasant, normal affect Skin: intact Neuro: Alert and oriented x3    Assessment & Plan:  Female with past medical history/past surgical history of OSA, morbid obesity, hypertension, fibromyalgia, depression, gout, CKD, degenerative disc disease-lumbar, bilateral knee surgeries for torn mensici, bilateral shoulder surgeries for rotator cuff presents with bilateral L > R knee pain.   1. Bilateral knee OA - endstage  Patient was supposed to have knee replacements last year, but due to insurance changed to summer 2022  Endstage OA in b/l knee per Ortho, no films available  No benefit with Heat/Cold, PT (~2019), Robaxin, Flexaril, Tizanidine  Continue bracing prn (limit on right knee - educated)  Continue Voltaren gel  Lidocaine 5% patch ordered  -Provided with a pain relief journal and discussed that it contains foods and lifestyle tips to naturally help to improve pain. Discussed that these lifestyle strategies are also very good for health unlike some medications which can have negative side effects. Discussed that the act of keeping a journal can be therapeutic and helpful to realize patterns what helps to trigger and alleviate pain.    Decreased Cymbalta to '20mg'$  daily.   Patient states main goal is to ambulate  Wants to buy a pool - afraid of public locations due to CoVid  Recommended follow up with Nephro regarding Chondroitin sulfate, appointment next month  Tolerating mild exercise - limited by back   Continue antiinflammatory diet -Discussed following foods that may reduce pain: 1) Ginger (especially studied for arthritis)- reduce leukotriene production to decrease inflammation 2) Blueberries- high in phytonutrients that decrease inflammation 3) Salmon- marine omega-3s reduce joint  swelling and pain 4) Pumpkin seeds- reduce inflammation 5) dark chocolate- reduces inflammation 6) turmeric- reduces inflammation 7) tart cherries - reduce pain and stiffness 8) extra virgin olive oil - its compound olecanthal helps to block prostaglandins  9) chili peppers- can be eaten or applied topically via capsaicin 10) mint- helpful for headache, muscle aches, joint pain, and itching 11) garlic- reduces inflammation  Link to further information on diet for chronic pain: http://[email or web address].[email or web address]/   2. Sleep disturbance  Continue Lunesta per PCP  Continue to await CPAP -Try to go outside near sunrise -Get exercise during the day.  -Turn off all devices an hour before bedtime.  -Teas that can benefit: chamomile, valerian root, Brahmi (Bacopa) -Can consider over the counter melatonin or magnesium glycinate (latter can also help with constipation) -Pistachios naturally increase the production of melatonin  -start amitriptyline '10mg'$  HS    3. Morbid Obesity  Continue follow up with dietitian  Encouraged weight loss  4. Myalgia   Will consider trigger point injections  5. Fibromyalgia  Prescribed aquatic therapy - had great benefit  Encouraged ROM, stretching  Continue Cymbalta  Decrease Lyrica to '50mg'$  TID  6) Spinal surgical hardware infection -discussed plan to wean off Percocet -will continue on one year of antibiotics -Discussed that the hydrocodone was helping with wound related pain which is still horrible. Will get  UDS and pain contact today.  -discussed maximizing tylenol which she is already doing  -avoid NSAIDs due to only one kidney present -discussed wean to one, then no opioids after wound vac removal  7) depression -discussed taking amitriptyline earlier in the evening -referred to neuropsych and psychology -discussed family stressors -recommended eating saurkraut -discussed kombucha,  yogurt, and kefir.  -Discussed her desire to return to work.  -discussed with neuropsychological  -continue probiotic/prebiotic  8) Food allergy -reviewed the results of her food allergy testing with her  9) Gout flare -discussed that her rheumatologist started her on steroids  18 minutes spent in reviewing the results of her food allergy testing with her, discussing her recent gout flare and that she was started on steroids, recommended elimination diet of foods she is sensitive to for 4 weeks to see if this helps to improve her abdominal pain

## 2021-12-31 NOTE — Therapy (Signed)
OUTPATIENT PHYSICAL THERAPY LOWER EXTREMITY EVALUATION   Patient Name: Amy Hooper MRN: [id number] DOB:Oct 27, 1950, 71 y.o., female Today's Date: 12/31/2021   PT End of Session - 12/31/21 1638     Visit Number 3   Pt late for appointment as she though session began at 16 rather than 32   Number of Visits 16    Date for PT Re-Evaluation 02/18/22    Authorization Type UHC    PT Start Time 1627    PT Stop Time 1700    PT Time Calculation (min) 33 min    Activity Tolerance Patient tolerated treatment well    Behavior During Therapy WFL for tasks assessed/performed             Past Medical History:  Diagnosis Date   Chronic kidney disease    Depression    Fibromyalgia    Gout    HTN (hypertension)    Insomnia    Morbid obesity ([place])    Osteoarthritis    Palpitation    Sleep apnea    Past Surgical History:  Procedure Laterality Date   APPLICATION OF WOUND VAC N/A 03/25/2021   Procedure: APPLICATION OF WOUND VAC;  Surgeon: [name], MD;  Location: [place];  Service: Orthopedics;  Laterality: N/A;   APPLICATION OF WOUND VAC N/A 03/30/2021   Procedure: WOUND VAC CHANGE 12x6x5;  Surgeon: [name], MD;  Location: [place];  Service: Orthopedics;  Laterality: N/A;   INCISION AND DRAINAGE OF WOUND N/A 03/25/2021   Procedure: LUMBAR POST OP INCISION IRRIGATION;  Surgeon: [name], MD;  Location: [place];  Service: Orthopedics;  Laterality: N/A;   JOINT REPLACEMENT     KNEE ARTHROSCOPY     LUMBAR WOUND DEBRIDEMENT N/A 03/30/2021   Procedure: REPEAT LUMBAR WOUND DEBRIDEMENT;  Surgeon: [name], MD;  Location: [place];  Service: Orthopedics;  Laterality: N/A;   right rotator cuff     ROTATOR CUFF REPAIR Left    TOTAL KNEE ARTHROPLASTY Left 09/08/2020   Procedure: LEFT TOTAL KNEE ARTHROPLASTY;  Surgeon: Leandrew Koyanagi, MD;  Location: [place];  Service: Orthopedics;  Laterality: Left;   TUBAL LIGATION     Patient Active Problem List   Diagnosis Date Noted   Acute  on chronic renal insufficiency 03/27/2021   Postoperative complication of skin involving drainage from surgical wound 03/25/2021   Post op infection 03/24/2021   S/P lumbar fusion 03/24/2021   Lumbar stenosis 03/09/2021   Degenerative spondylolisthesis 02/19/2021   Primary osteoarthritis of left knee 09/08/2020   DJD (degenerative joint disease) of knee 09/08/2020   Status post total left knee replacement 09/08/2020   Spondylosis without myelopathy or radiculopathy, lumbar region 07/17/2020   Chronic pain syndrome 07/17/2020   Myalgia 07/17/2020   OSA (obstructive sleep apnea) 05/19/2020   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/17/2020   Sleep disturbance 04/17/2020   Chronic pain of both shoulders 03/03/2020   Pain in both hands 03/03/2020   Primary osteoarthritis of both knees 03/03/2020   Pain in both feet 03/03/2020   History of chronic kidney disease 03/03/2020   DDD (degenerative disc disease), lumbar 02/05/2020   Vitamin D deficiency 01/09/2020   Hyperlipidemia 01/09/2020   CKD (chronic kidney disease) stage 3, GFR 30-59 ml/min ([place]) 01/09/2020   HTN (hypertension)    Morbid obesity ([place])    Gout    Fibromyalgia    Depression    Insomnia     PCP: Isaac Bliss, [name],  MD  REFERRING PROVIDER: Izora Ribas, MD  REFERRING DIAG: [phone] (ICD-10-CM) - Chronic pain of right knee  THERAPY DIAG:  Chronic pain of right knee  Fibromyalgia  Muscle weakness-general  Chronic right shoulder pain  Rationale for Evaluation and Treatment Rehabilitation  ONSET DATE: Since 2018  SUBJECTIVE:   Current: Pt reports pain spike in right gr toe after last session in middle of night.  She believed it to be a gout flare from walking in pool.  By waking time in am pain was gone.   SUBJECTIVE STATEMENT: Left TKR x ~3 years ago with good results, Dec 2022 back surgery with complication of infection, on prophylactic dose of antibiotics x 1 year as per  infectious disease will end in 4/24 then is planning on r TKE.  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 Yes: NPRS scale: 6/10 Pain location: genralized Pain description: ache Aggravating factors: movement or stillness Relieving factors: hydrocodone  Right knee:max 10/10 at night prior to bed  PRECAUTIONS: Knee and Back  WEIGHT BEARING RESTRICTIONS No  FALLS:  Has patient fallen in last 6 months? 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 XR right knee: Moderately advanced tricompartmental DJD.  PATIENT SURVEYS:  FOTO 36 with goal of 6  COGNITION:  Overall cognitive status: Within functional limits for tasks assessed     SENSATION: Perphreal nueropathies right foot   POSTURE:  right valgus deformity   LOWER EXTREMITY ROM:                       Right knee: 0-110  LOWER EXTREMITY MMT:  MMT Right eval Left eval  Hip flexion 4- 4  Hip extension    Hip abduction 4 5  Hip adduction 4 5  Hip internal rotation    Hip external rotation    Knee flexion 4 4+  Knee extension    Ankle dorsiflexion    Ankle plantarflexion    Ankle inversion    Ankle eversion     (Blank rows = not tested)  UE ROM  RIGHT: Abd and flex:full with P! At end range  Flex  str: 3- Elbow 5/5  LEFT: rom wfl.  Str3+/5   FUNCTIONAL TESTS:  3.5 reps30 seconds chair  stand test Timed up and go (TUG): 24.27 Berg Balance Scale: 31/56  GAIT: Distance walked: 500 Assistive device utilized: Single point cane Level of assistance: Complete Independence Comments: lateral deviation with decreased right knee flex.  Cane in [place] (needs to be left), decreased step length and cadence.    TODAY'S TREATMENT: Pt seen for aquatic therapy today.  Treatment took place in water 3.25-4.5 ft in depth at the [facility]. Temp of water was 92.  Pt entered/exited the pool via stairs using alternating pattern with 1 hand rail.   *Ue supported on wall: hip openers, squats; hip flex str; adductor str; L stretch for lumbar spine;  l stretch with hip hiking *STS from bench onto water step.  *Step ups forward onto water step x10 leading R/L unsupported; completed backward x 10; side stepping TKE CKC x 10 R/L *Straddling noodle: cycling; add/abd and skiing  Pt requires the buoyancy and hydrostatic pressure of water for support, and to offload joints by unweighting joint load by at least 50 % in navel deep water and by at least 75-80% in chest to neck deep water.  Viscosity of the water is needed for resistance of strengthening. Water current perturbations provides challenge to standing balance requiring increased core activation.    PATIENT EDUCATION:  Education details: Discussed eval findings, rehab rationale and POC and patient is in agreement Person educated: Patient Education method: Explanation Education comprehension: verbalized understanding   HOME EXERCISE PROGRAM: From previous episode  ASSESSMENT:  CLINICAL IMPRESSION: Pt  reports pain in right toe after last session which subsided quickly.  She had a similar flare many years ago after walking in a pool.  I eliminated walking today for "exercise/warm up", but did progress strengthening.  She tolerates well without any increase in discomfort.  She does report feeling very good after last session  initially, "energized and loose".  Goals ongoing  Patient is a 71 y.o. f who was seen today for physical therapy evaluation and treatment for right knee pain.  Pt with multiple comorbidities including LB fusion with complication of infection continues on prophalactic antibotic.  Has had Left TKR and right shoulder surgery in recent past.  Fibromyalgia x 5 years.  Her pain patterns are difficult to descern specific etiology with fibromyalgia probably being foremost aggravator.  She has had injection in her right knee and reports no discomfort at this time. R shoulder pain with flex and abd >100d. Then other general body pain (hands, ankles, elbows, cervical spine) 6/10 on daily basis.  She has had aquatic therapy in the past here at [facility] and reports safety and indep submerged.  She lives alone during the week with her husband truck driver home just on weekends. She is a good candidate for aquatic therapy. Plan to progress to land based treatment after ~4 weeks of aquatics.  She does report intent to get membership [facility] at [facility].   OBJECTIVE IMPAIRMENTS Abnormal gait, decreased activity tolerance, decreased balance, decreased mobility, difficulty walking, decreased strength, and pain.   ACTIVITY LIMITATIONS lifting, bending, squatting, stairs, transfers, bed mobility, and locomotion level  PARTICIPATION LIMITATIONS: cleaning, shopping, community activity, and yard work  [place], Past/current experiences, and 1-2 comorbidities: Lumbar dysfunction, OA,   are also affecting patient's functional outcome.   REHAB POTENTIAL: Good  CLINICAL DECISION MAKING: Evolving/moderate complexity  EVALUATION COMPLEXITY: Moderate   GOALS: Goals reviewed with patient? Yes  SHORT TERM GOALS: Target date: 01/28/2022  Pt will initiate access to pool/gym in order to be compliant with final HEP land and aquatic based Baseline:no access Goal status: INITIAL  2.  Pt to be ale to tolerate amb  up towards 15 mins to demonstrate improved toleration to activity Baseline: 7 Goal status: INITIAL  3.  Pt will improve on Berg balance test to 40/46 Baseline: 31/56 Goal status: INITIAL  4.  Pt will improve on Right hip and knee flex to 1/2  grade without pain with resistance Baseline: 4- to 4/5 Goal status: INITIAL  LONG TERM GOALS: Target date: 02/25/2022   Pt to meet Foto goal of 49% Baseline: 36 Goal status: INITIAL  2.  Pt to be indep with  final HEP Baseline:  Goal status: INITIAL  3.  Pt will improve on 30s STS test to 7 or> to demonstrate improved le strength Baseline: 3.5 Goal status: INITIAL  4.  Pt will improve in bilat shoulder str up to or > 1 full grade Baseline: 3- to 3+/5 Goal status: INITIAL     PLAN: PT FREQUENCY: 1-2x/week  PT DURATION: 8 weeks  PLANNED INTERVENTIONS: Therapeutic exercises, Therapeutic activity, Neuromuscular re-education, Balance training, Gait training, Patient/Family education, Self Care, Joint mobilization, Joint manipulation, Stair training, Aquatic Therapy, Dry Needling, Cryotherapy, Moist heat, Taping, Ultrasound, Manual therapy, and Re-evaluation  PLAN FOR NEXT SESSION: Assess toleration to last session Gr toe pain return? Right le strengthening, core and bilat shoulder strengthening, balance retraining   Denton Meek, PT MPT 12/31/2021, 6:21 PM

## 2022-01-04 NOTE — Therapy (Signed)
OUTPATIENT PHYSICAL THERAPY LOWER EXTREMITY EVALUATION   Patient Name: Amy Hooper MRN: [id number] DOB:11-Aug-1950, 71 y.o., female Today's Date: 01/05/2022   PT End of Session - 01/05/22 1625     Visit Number 4    Number of Visits 16    Date for PT Re-Evaluation 02/18/22    Authorization Type UHC    PT Start Time 5329    PT Stop Time 1700    PT Time Calculation (min) 44 min    Activity Tolerance Patient tolerated treatment well;Patient limited by pain    Behavior During Therapy WFL for tasks assessed/performed              Past Medical History:  Diagnosis Date   Chronic kidney disease    Depression    Fibromyalgia    Gout    HTN (hypertension)    Insomnia    Morbid obesity ([place])    Osteoarthritis    Palpitation    Sleep apnea    Past Surgical History:  Procedure Laterality Date   APPLICATION OF WOUND VAC N/A 03/25/2021   Procedure: APPLICATION OF WOUND VAC;  Surgeon: [name], MD;  Location: [place];  Service: Orthopedics;  Laterality: N/A;   APPLICATION OF WOUND VAC N/A 03/30/2021   Procedure: WOUND VAC CHANGE 12x6x5;  Surgeon: [name], MD;  Location: [place];  Service: Orthopedics;  Laterality: N/A;   INCISION AND DRAINAGE OF WOUND N/A 03/25/2021   Procedure: LUMBAR POST OP INCISION IRRIGATION;  Surgeon: [name], MD;  Location: [place];  Service: Orthopedics;  Laterality: N/A;   JOINT REPLACEMENT     KNEE ARTHROSCOPY     LUMBAR WOUND DEBRIDEMENT N/A 03/30/2021   Procedure: REPEAT LUMBAR WOUND DEBRIDEMENT;  Surgeon: [name], MD;  Location: [place];  Service: Orthopedics;  Laterality: N/A;   right rotator cuff     ROTATOR CUFF REPAIR Left    TOTAL KNEE ARTHROPLASTY Left 09/08/2020   Procedure: LEFT TOTAL KNEE ARTHROPLASTY;  Surgeon: Leandrew Koyanagi, MD;  Location: [place];  Service: Orthopedics;  Laterality: Left;   TUBAL LIGATION     Patient Active Problem List   Diagnosis Date Noted   Acute on chronic renal insufficiency 03/27/2021    Postoperative complication of skin involving drainage from surgical wound 03/25/2021   Post op infection 03/24/2021   S/P lumbar fusion 03/24/2021   Lumbar stenosis 03/09/2021   Degenerative spondylolisthesis 02/19/2021   Primary osteoarthritis of left knee 09/08/2020   DJD (degenerative joint disease) of knee 09/08/2020   Status post total left knee replacement 09/08/2020   Spondylosis without myelopathy or radiculopathy, lumbar region 07/17/2020   Chronic pain syndrome 07/17/2020   Myalgia 07/17/2020   OSA (obstructive sleep apnea) 05/19/2020   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/17/2020   Sleep disturbance 04/17/2020   Chronic pain of both shoulders 03/03/2020   Pain in both hands 03/03/2020   Primary osteoarthritis of both knees 03/03/2020   Pain in both feet 03/03/2020   History of chronic kidney disease 03/03/2020   DDD (degenerative disc disease), lumbar 02/05/2020   Vitamin D deficiency 01/09/2020   Hyperlipidemia 01/09/2020   CKD (chronic kidney disease) stage 3, GFR 30-59 ml/min ([place]) 01/09/2020   HTN (hypertension)    Morbid obesity ([place])    Gout    Fibromyalgia    Depression    Insomnia     PCP: Isaac Bliss, [name], MD  REFERRING PROVIDER: Izora Ribas, MD  REFERRING DIAG:  M25.561,G89.29 (ICD-10-CM) - Chronic pain of right knee  THERAPY DIAG:  Chronic pain of right knee  Fibromyalgia  Muscle weakness-general  Rationale for Evaluation and Treatment Rehabilitation  ONSET DATE: Since 2018  SUBJECTIVE:   Current: Increased pain over w/e. Fibromyalgia as well as pain over scar in back.   SUBJECTIVE STATEMENT: Left TKR x ~3 years ago with good results, Dec 2022 back surgery with complication of infection, on prophylactic dose of antibiotics x 1 year as per infectious disease will end in 4/24 then is planning on r TKE.  Pt reports residual weakness from back infection ue and core.Perpheal neuropthies right foot (numbness,  pain)residual from lumbar fusion. 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 Yes: NPRS scale: 8/10 Pain location: genralized Pain description: ache Aggravating factors: movement or stillness Relieving factors: hydrocodone  Right knee:max 10/10 at night prior to bed  PRECAUTIONS: Knee and Back  WEIGHT BEARING RESTRICTIONS No  FALLS:  Has patient fallen in last 6 months? 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 XR right knee: Moderately advanced tricompartmental DJD.  PATIENT SURVEYS:  FOTO 36 with goal of 22  COGNITION:  Overall cognitive status: Within functional limits for tasks assessed     SENSATION: Perphreal nueropathies right foot   POSTURE:  right valgus deformity   LOWER EXTREMITY ROM:                       Right knee: 0-110  LOWER EXTREMITY MMT:  MMT Right eval Left eval  Hip flexion 4- 4  Hip extension    Hip abduction 4 5  Hip adduction 4 5  Hip internal rotation    Hip external rotation    Knee flexion 4 4+  Knee extension    Ankle dorsiflexion    Ankle plantarflexion    Ankle inversion    Ankle eversion     (Blank rows = not tested)  UE ROM  RIGHT: Abd and flex:full with P! At end range  Flex  str: 3- Elbow 5/5  LEFT: rom wfl.  Str3+/5   FUNCTIONAL TESTS:  3.5 reps30 seconds chair stand test Timed up and go (TUG): 24.27 Berg Balance Scale: 31/56  GAIT: Distance walked: 500 Assistive device utilized: Single point cane Level of assistance:  Complete Independence Comments: lateral deviation with decreased right knee flex.  Cane in [place] (needs to be left), decreased step length and cadence.    TODAY'S TREATMENT: Pt seen for aquatic therapy today.  Treatment took place in water 3.25-4.5 ft in depth at the [facility]. Temp of water was 92.  Pt entered/exited the pool via stairs using alternating pattern with 1 hand rail.  *walking ue support on white hand buoys 4.85f forward back and side stepping *L stretch x 3 30s hold; L stretch with hip hiking R/L *Straddling noodle: cycling; add/abd and skiing *Standing: UE using 1 foam hand buoy: Horizontal abd with scapular retraction; add/abd and flex ext 0-90d. *Ue support on hand buoys straddling yellow noodle: hip openers,  squats; hip flex str; adductor str; *UE support on wall hip circles R/L cw and ccw *core egagement/abdominal iso contraction x5 using 1/3 noodle *side lunge add stretch *L stretch Upon end of session pt continues in pool STS from water step and stretching.   Pt requires the buoyancy and hydrostatic pressure of water for support, and to offload joints by unweighting joint load by at least 50 % in navel deep water and by at least 75-80% in chest to neck deep water.  Viscosity of the water is needed for resistance of strengthening. Water current perturbations provides challenge to standing balance requiring increased core activation.    PATIENT EDUCATION:  Education details: Discussed eval findings, rehab rationale and POC and patient is in agreement Person educated: Patient Education method: Explanation Education comprehension: verbalized understanding   HOME EXERCISE PROGRAM: From previous episode  ASSESSMENT:  CLINICAL IMPRESSION: R gr toe resolved.  Increased overall general pain with weather change.  She tolerated well, gentle movement of all joints as well as stretching in warm water decreasing pain end of session. VC for upright  positioning with exercise. Able to gain good muscle length with stretching which assists with decreased overall pain response. Pt edu on pain management on "bad day"  She VU of importance of movement to improve pain. End of session pain 4/10 from 8/10. Goals ongoing   Patient is a 71 y.o. f who was seen today for physical therapy evaluation and treatment for right knee pain.  Pt with multiple comorbidities including LB fusion with complication of infection continues on prophalactic antibotic.  Has had Left TKR and right shoulder surgery in recent past.  Fibromyalgia x 5 years.  Her pain patterns are difficult to descern specific etiology with fibromyalgia probably being foremost aggravator.  She has had injection in her right knee and reports no discomfort at this time. R shoulder pain with flex and abd >100d. Then other general body pain (hands, ankles, elbows, cervical spine) 6/10 on daily basis.  She has had aquatic therapy in the past here at [facility] and reports safety and indep submerged.  She lives alone during the week with her husband truck driver home just on weekends. She is a good candidate for aquatic therapy. Plan to progress to land based treatment after ~4 weeks of aquatics.  She does report intent to get membership here at [facility].   OBJECTIVE IMPAIRMENTS Abnormal gait, decreased activity tolerance, decreased balance, decreased mobility, difficulty walking, decreased strength, and pain.   ACTIVITY LIMITATIONS lifting, bending, squatting, stairs, transfers, bed mobility, and locomotion level  PARTICIPATION LIMITATIONS: cleaning, shopping, community activity, and yard work  [place], Past/current experiences, and 1-2 comorbidities: Lumbar dysfunction, OA,   are also affecting patient's functional outcome.   REHAB POTENTIAL: Good  CLINICAL DECISION MAKING: Evolving/moderate complexity  EVALUATION COMPLEXITY: Moderate   GOALS: Goals reviewed with patient?  Yes  SHORT TERM GOALS: Target date: 02/02/2022  Pt will initiate access to pool/gym in order to be compliant with final HEP land and aquatic based Baseline:no access Goal status: INITIAL  2.  Pt to be ale to tolerate amb up towards 15 mins to demonstrate improved toleration to activity Baseline: 7 Goal status: INITIAL  3.  Pt will improve on Berg balance test to 40/46 Baseline: 31/56 Goal status: INITIAL  4.  Pt will improve on Right hip and knee flex to 1/2  grade without pain with resistance Baseline: 4- to 4/5 Goal status: INITIAL  LONG TERM GOALS: Target date:  03/02/2022   Pt to meet Foto goal of 49% Baseline: 36 Goal status: INITIAL  2.  Pt to be indep with final HEP Baseline:  Goal status: INITIAL  3.  Pt will improve on 30s STS test to 7 or> to demonstrate improved le strength Baseline: 3.5 Goal status: INITIAL  4.  Pt will improve in bilat shoulder str up to or > 1 full grade Baseline: 3- to 3+/5 Goal status: INITIAL     PLAN: PT FREQUENCY: 1-2x/week  PT DURATION: 8 weeks  PLANNED INTERVENTIONS: Therapeutic exercises, Therapeutic activity, Neuromuscular re-education, Balance training, Gait training, Patient/Family education, Self Care, Joint mobilization, Joint manipulation, Stair training, Aquatic Therapy, Dry Needling, Cryotherapy, Moist heat, Taping, Ultrasound, Manual therapy, and Re-evaluation  PLAN FOR NEXT SESSION: Assess toleration to last session Gr toe pain return? Right le strengthening, core and bilat shoulder strengthening, balance retraining   Denton Meek, PT MPT 01/05/2022, 4:27 PM

## 2022-01-05 ENCOUNTER — Encounter (HOSPITAL_BASED_OUTPATIENT_CLINIC_OR_DEPARTMENT_OTHER): Payer: Self-pay | Admitting: Physical Therapy

## 2022-01-05 ENCOUNTER — Ambulatory Visit (HOSPITAL_BASED_OUTPATIENT_CLINIC_OR_DEPARTMENT_OTHER): Payer: Commercial Managed Care - PPO | Admitting: Physical Therapy

## 2022-01-05 DIAGNOSIS — M797 Fibromyalgia: Secondary | ICD-10-CM

## 2022-01-05 DIAGNOSIS — M25561 Pain in right knee: Secondary | ICD-10-CM | POA: Diagnosis not present

## 2022-01-05 DIAGNOSIS — G8929 Other chronic pain: Secondary | ICD-10-CM

## 2022-01-05 DIAGNOSIS — M6281 Muscle weakness (generalized): Secondary | ICD-10-CM

## 2022-01-18 ENCOUNTER — Encounter: Payer: Self-pay | Admitting: Physical Medicine and Rehabilitation

## 2022-01-19 ENCOUNTER — Other Ambulatory Visit: Payer: Self-pay | Admitting: Internal Medicine

## 2022-01-19 DIAGNOSIS — I10 Essential (primary) hypertension: Secondary | ICD-10-CM

## 2022-01-19 MED ORDER — HYDROCODONE-ACETAMINOPHEN 7.5-325 MG PO TABS: 1.0000 | ORAL_TABLET | Freq: Two times a day (BID) | ORAL | 0 refills | Status: DC | PRN

## 2022-01-20 ENCOUNTER — Encounter (HOSPITAL_BASED_OUTPATIENT_CLINIC_OR_DEPARTMENT_OTHER): Payer: Self-pay | Admitting: Physical Therapy

## 2022-01-20 ENCOUNTER — Ambulatory Visit (HOSPITAL_BASED_OUTPATIENT_CLINIC_OR_DEPARTMENT_OTHER): Payer: Commercial Managed Care - PPO | Attending: Physical Medicine and Rehabilitation | Admitting: Physical Therapy

## 2022-01-20 ENCOUNTER — Encounter: Payer: Self-pay | Admitting: Internal Medicine

## 2022-01-20 DIAGNOSIS — M25561 Pain in right knee: Secondary | ICD-10-CM | POA: Diagnosis not present

## 2022-01-20 DIAGNOSIS — M6281 Muscle weakness (generalized): Secondary | ICD-10-CM | POA: Diagnosis present

## 2022-01-20 DIAGNOSIS — M797 Fibromyalgia: Secondary | ICD-10-CM | POA: Insufficient documentation

## 2022-01-20 DIAGNOSIS — G8929 Other chronic pain: Secondary | ICD-10-CM | POA: Diagnosis present

## 2022-01-20 DIAGNOSIS — M5459 Other low back pain: Secondary | ICD-10-CM | POA: Diagnosis present

## 2022-01-20 NOTE — Therapy (Signed)
OUTPATIENT PHYSICAL THERAPY LOWER EXTREMITY TREATMENT   Patient Name: Amy Hooper MRN: [id number] DOB:[birth year], 71 y.o., female Today's Date: 01/20/2022   PT End of Session - 01/20/22 0946     Visit Number 5    Number of Visits 16    Date for PT Re-Evaluation 02/18/22    Authorization Type UHC    PT Start Time [phone]    PT Stop Time 1028    PT Time Calculation (min) 42 min    Activity Tolerance Patient tolerated treatment well;Patient limited by pain    Behavior During Therapy [facility] for tasks assessed/performed              Past Medical History:  Diagnosis Date   Chronic kidney disease    Depression    Fibromyalgia    Gout    HTN (hypertension)    Insomnia    Morbid obesity ([place])    Osteoarthritis    Palpitation    Sleep apnea    Past Surgical History:  Procedure Laterality Date   APPLICATION OF WOUND VAC N/A 03/25/2021   Procedure: APPLICATION OF WOUND VAC;  Surgeon: [name], MD;  Location: [place];  Service: Orthopedics;  Laterality: N/A;   APPLICATION OF WOUND VAC N/A 03/30/2021   Procedure: WOUND VAC CHANGE 12x6x5;  Surgeon: [name], MD;  Location: [place];  Service: Orthopedics;  Laterality: N/A;   INCISION AND DRAINAGE OF WOUND N/A 03/25/2021   Procedure: LUMBAR POST OP INCISION IRRIGATION;  Surgeon: [name], MD;  Location: [place];  Service: Orthopedics;  Laterality: N/A;   JOINT REPLACEMENT     KNEE ARTHROSCOPY     LUMBAR WOUND DEBRIDEMENT N/A 03/30/2021   Procedure: REPEAT LUMBAR WOUND DEBRIDEMENT;  Surgeon: [name], MD;  Location: [place];  Service: Orthopedics;  Laterality: N/A;   right rotator cuff     ROTATOR CUFF REPAIR Left    TOTAL KNEE ARTHROPLASTY Left 09/08/2020   Procedure: LEFT TOTAL KNEE ARTHROPLASTY;  Surgeon: Leandrew Koyanagi, MD;  Location: [place];  Service: Orthopedics;  Laterality: Left;   TUBAL LIGATION     Patient Active Problem List   Diagnosis Date Noted   Acute on chronic renal insufficiency 03/27/2021    Postoperative complication of skin involving drainage from surgical wound 03/25/2021   Post op infection 03/24/2021   S/P lumbar fusion 03/24/2021   Lumbar stenosis 03/09/2021   Degenerative spondylolisthesis 02/19/2021   Primary osteoarthritis of left knee 09/08/2020   DJD (degenerative joint disease) of knee 09/08/2020   Status post total left knee replacement 09/08/2020   Spondylosis without myelopathy or radiculopathy, lumbar region 07/17/2020   Chronic pain syndrome 07/17/2020   Myalgia 07/17/2020   OSA (obstructive sleep apnea) 05/19/2020   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/17/2020   Sleep disturbance 04/17/2020   Chronic pain of both shoulders 03/03/2020   Pain in both hands 03/03/2020   Primary osteoarthritis of both knees 03/03/2020   Pain in both feet 03/03/2020   History of chronic kidney disease 03/03/2020   DDD (degenerative disc disease), lumbar 02/05/2020   Vitamin D deficiency 01/09/2020   Hyperlipidemia 01/09/2020   CKD (chronic kidney disease) stage 3, GFR 30-59 ml/min ([place]) 01/09/2020   HTN (hypertension)    Morbid obesity ([place])    Gout    Fibromyalgia    Depression    Insomnia     PCP: Isaac Bliss, [name], MD  REFERRING PROVIDER: Izora Ribas, MD  REFERRING DIAG:  M25.561,G89.29 (ICD-10-CM) - Chronic pain of right knee  THERAPY DIAG:  No diagnosis found.  Rationale for Evaluation and Treatment Rehabilitation  ONSET DATE: Since 2018  SUBJECTIVE:   "I'm terrible.  The rain really kicked up my fibromyaligia" .  Pt reports she may call her surgeon or go to urgent care to have her R ant rib cage checked out.   She reports that she is more confident, standing taller and has more stamina.    SUBJECTIVE STATEMENT: Left TKR x ~3 years ago with good results, Dec 2022 back surgery with complication of infection, on prophylactic dose of antibiotics x 1 year as per infectious disease will end in 4/24 then is planning on r TKE.  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 Yes: NPRS scale: 8.5/10 Pain location: generalized Pain description: ache Aggravating factors: movement or stillness Relieving factors: hydrocodone    PRECAUTIONS: Knee and Back  WEIGHT BEARING RESTRICTIONS No  FALLS:  Has patient fallen in last 6 months? 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 XR right knee: Moderately advanced tricompartmental DJD.  PATIENT SURVEYS:  FOTO 36 with goal of 16  COGNITION:  Overall cognitive status: Within functional limits for tasks assessed     SENSATION: Perphreal nueropathies right foot   POSTURE:  right valgus deformity   LOWER EXTREMITY ROM:                       Right knee: 0-110  LOWER EXTREMITY MMT:  MMT Right eval Left eval  Hip flexion 4- 4  Hip extension    Hip abduction 4 5  Hip adduction 4 5  Hip internal rotation    Hip external rotation    Knee flexion 4 4+  Knee extension    Ankle dorsiflexion    Ankle plantarflexion    Ankle inversion    Ankle eversion     (Blank rows = not tested)  UE ROM  RIGHT: Abd and flex:full with P! At end range  Flex  str: 3- Elbow 5/5  LEFT: rom wfl.  Str3+/5   FUNCTIONAL TESTS:  3.5 reps30 seconds chair stand test Timed up and go (TUG): 24.27 Berg Balance Scale: 31/56  GAIT: Distance walked: 500 Assistive  device utilized: Single point cane Level of assistance: Complete Independence Comments: lateral deviation with decreased right knee flex.  Cane in [place] (needs to be left), decreased step length and cadence.    TODAY'S TREATMENT: Pt seen for aquatic therapy today.  Treatment took place in water 3.25-4.5 ft in depth at the [facility]. Temp of water was 92.  Pt entered/exited the pool independently via stairs using alternating pattern with bilat hand rail  *without UE support: walking forward; switched to holding white barbell for improved posture.  -> backwards , side stepping, and marching forward  / backwards *Straddling noodle holding yellow hand floats: cycling; add/abd and skiing *  L stretch holding noodle; repeated with slight rotation  * slow squats at wall to relieve low back tightness * holding yellow noodle:  hip openers(alternating) , single leg clam; side to side lunge for adductor stretch *Ab set with short blue noodle pull down  Pt requires the buoyancy and hydrostatic pressure of water for support, and to offload joints by unweighting joint load by at least 50 % in navel deep water and by at least 75-80% in chest to neck deep water.  Viscosity of the water is needed for resistance of strengthening. Water current perturbations provides challenge to standing balance requiring increased core activation.    PATIENT EDUCATION:  Education details: Discussed eval findings, rehab rationale and POC and patient is in agreement Person educated: Patient Education method: Explanation Education comprehension: verbalized understanding   HOME EXERCISE PROGRAM: From previous episode  ASSESSMENT:  CLINICAL IMPRESSION: Continued increase in overall general pain with weather change.  She reports reduction of knee tightness and pain while cycling in deeper water.  VC for upright positioning with exercise.  She tolerated well, gentle movement of all joints as well as stretching  in warm water decreasing pain to 4-5/10 by end of session. Goals ongoing. Plan to progress to land based treatment after ~4 weeks of aquatics.     OBJECTIVE IMPAIRMENTS Abnormal gait, decreased activity tolerance, decreased balance, decreased mobility, difficulty walking, decreased strength, and pain.   ACTIVITY LIMITATIONS lifting, bending, squatting, stairs, transfers, bed mobility, and locomotion level  PARTICIPATION LIMITATIONS: cleaning, shopping, community activity, and yard work  [place], Past/current experiences, and 1-2 comorbidities: Lumbar dysfunction, OA,   are also affecting patient's functional outcome.   REHAB POTENTIAL: Good  CLINICAL DECISION MAKING: Evolving/moderate complexity  EVALUATION COMPLEXITY: Moderate   GOALS: Goals reviewed with patient? Yes  SHORT TERM GOALS: Target date:  Pt will initiate access to pool/gym in order to be compliant with final HEP land and aquatic based Baseline:no access Goal status: INITIAL  2.  Pt to be ale to tolerate amb up towards 15 mins to demonstrate improved toleration to activity Baseline: 7 Goal status: INITIAL  3.  Pt will improve on Berg balance test to 40/46 Baseline: 31/56 Goal status: INITIAL  4.  Pt will improve on Right hip and knee flex to 1/2  grade without pain with resistance Baseline: 4- to 4/5 Goal status: INITIAL  LONG TERM GOALS: Target date:  Pt to meet Foto goal of 49% Baseline: 36 Goal status: INITIAL  2.  Pt to be indep with final HEP Baseline:  Goal status: INITIAL  3.  Pt will improve on 30s STS test to 7 or> to demonstrate improved le strength Baseline: 3.5 Goal status: INITIAL  4.  Pt will improve in bilat shoulder str up to or > 1 full grade Baseline: 3- to 3+/5 Goal status: INITIAL     PLAN: PT FREQUENCY: 1-2x/week  PT DURATION: 8 weeks  PLANNED INTERVENTIONS: Therapeutic exercises, Therapeutic activity, Neuromuscular re-education, Balance training, Gait  training, Patient/Family education, Self Care, Joint mobilization, Joint manipulation, Stair training, Aquatic Therapy, Dry Needling, Cryotherapy, Moist heat, Taping, Ultrasound, Manual therapy, and Re-evaluation  PLAN FOR NEXT SESSION: continue RLE strengthening, core and bilat shoulder strengthening, balance retraining   Kerin Perna, PTA 01/20/22 1:49 PM [facility] [address] [place], [place], [address] Phone: [phone]   Fax:  [phone]

## 2022-01-21 ENCOUNTER — Encounter: Payer: Self-pay | Admitting: Orthopaedic Surgery

## 2022-01-22 ENCOUNTER — Ambulatory Visit (INDEPENDENT_AMBULATORY_CARE_PROVIDER_SITE_OTHER): Payer: Commercial Managed Care - PPO

## 2022-01-22 ENCOUNTER — Ambulatory Visit
Admission: EM | Admit: 2022-01-22 | Discharge: 2022-01-22 | Disposition: A | Discharge status: Home / Self Care | Payer: Commercial Managed Care - PPO | Attending: Internal Medicine | Admitting: Internal Medicine

## 2022-01-22 ENCOUNTER — Encounter (HOSPITAL_BASED_OUTPATIENT_CLINIC_OR_DEPARTMENT_OTHER): Payer: Self-pay | Admitting: Physical Therapy

## 2022-01-22 ENCOUNTER — Ambulatory Visit (HOSPITAL_BASED_OUTPATIENT_CLINIC_OR_DEPARTMENT_OTHER): Payer: Commercial Managed Care - PPO | Admitting: Physical Therapy

## 2022-01-22 ENCOUNTER — Encounter: Payer: Self-pay | Admitting: Emergency Medicine

## 2022-01-22 DIAGNOSIS — G8929 Other chronic pain: Secondary | ICD-10-CM

## 2022-01-22 DIAGNOSIS — M5459 Other low back pain: Secondary | ICD-10-CM

## 2022-01-22 DIAGNOSIS — S2231XD Fracture of one rib, right side, subsequent encounter for fracture with routine healing: Secondary | ICD-10-CM

## 2022-01-22 DIAGNOSIS — R0782 Intercostal pain: Secondary | ICD-10-CM

## 2022-01-22 DIAGNOSIS — M6281 Muscle weakness (generalized): Secondary | ICD-10-CM

## 2022-01-22 DIAGNOSIS — M797 Fibromyalgia: Secondary | ICD-10-CM

## 2022-01-22 DIAGNOSIS — M25561 Pain in right knee: Secondary | ICD-10-CM | POA: Diagnosis not present

## 2022-01-22 NOTE — Therapy (Addendum)
OUTPATIENT PHYSICAL THERAPY LOWER EXTREMITY TREATMENT   Patient Name: Amy Hooper MRN: [id number] DOB:September 05, 1950, 71 y.o., female Today's Date: 01/22/2022   PT End of Session - 01/22/22 1032     Visit Number 6    Number of Visits 16    Date for PT Re-Evaluation 02/18/22    Authorization Type UHC    PT Start Time 1028    PT Stop Time 1110    PT Time Calculation (min) 42 min    Activity Tolerance Patient limited by pain    Behavior During Therapy WFL for tasks assessed/performed              Past Medical History:  Diagnosis Date   Chronic kidney disease    Depression    Fibromyalgia    Gout    HTN (hypertension)    Insomnia    Morbid obesity ([place])    Osteoarthritis    Palpitation    Sleep apnea    Past Surgical History:  Procedure Laterality Date   APPLICATION OF WOUND VAC N/A 03/25/2021   Procedure: APPLICATION OF WOUND VAC;  Surgeon: [name], MD;  Location: [place];  Service: Orthopedics;  Laterality: N/A;   APPLICATION OF WOUND VAC N/A 03/30/2021   Procedure: WOUND VAC CHANGE 12x6x5;  Surgeon: [name], MD;  Location: [place];  Service: Orthopedics;  Laterality: N/A;   INCISION AND DRAINAGE OF WOUND N/A 03/25/2021   Procedure: LUMBAR POST OP INCISION IRRIGATION;  Surgeon: [name], MD;  Location: [place];  Service: Orthopedics;  Laterality: N/A;   JOINT REPLACEMENT     KNEE ARTHROSCOPY     LUMBAR WOUND DEBRIDEMENT N/A 03/30/2021   Procedure: REPEAT LUMBAR WOUND DEBRIDEMENT;  Surgeon: [name], MD;  Location: [place];  Service: Orthopedics;  Laterality: N/A;   right rotator cuff     ROTATOR CUFF REPAIR Left    TOTAL KNEE ARTHROPLASTY Left 09/08/2020   Procedure: LEFT TOTAL KNEE ARTHROPLASTY;  Surgeon: Leandrew Koyanagi, MD;  Location: [place];  Service: Orthopedics;  Laterality: Left;   TUBAL LIGATION     Patient Active Problem List   Diagnosis Date Noted   Acute on chronic renal insufficiency 03/27/2021   Postoperative complication of skin  involving drainage from surgical wound 03/25/2021   Post op infection 03/24/2021   S/P lumbar fusion 03/24/2021   Lumbar stenosis 03/09/2021   Degenerative spondylolisthesis 02/19/2021   Primary osteoarthritis of left knee 09/08/2020   DJD (degenerative joint disease) of knee 09/08/2020   Status post total left knee replacement 09/08/2020   Spondylosis without myelopathy or radiculopathy, lumbar region 07/17/2020   Chronic pain syndrome 07/17/2020   Myalgia 07/17/2020   OSA (obstructive sleep apnea) 05/19/2020   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/17/2020   Sleep disturbance 04/17/2020   Chronic pain of both shoulders 03/03/2020   Pain in both hands 03/03/2020   Primary osteoarthritis of both knees 03/03/2020   Pain in both feet 03/03/2020   History of chronic kidney disease 03/03/2020   DDD (degenerative disc disease), lumbar 02/05/2020   Vitamin D deficiency 01/09/2020   Hyperlipidemia 01/09/2020   CKD (chronic kidney disease) stage 3, GFR 30-59 ml/min ([place]) 01/09/2020   HTN (hypertension)    Morbid obesity ([place])    Gout    Fibromyalgia    Depression    Insomnia     PCP: Isaac Bliss, [name], MD  REFERRING PROVIDER: Izora Ribas, MD  REFERRING DIAG: [id number] (ICD-10-CM) -  Chronic pain of right knee  THERAPY DIAG:  Chronic pain of right knee  Fibromyalgia  Muscle weakness-general  Other low back pain  Rationale for Evaluation and Treatment Rehabilitation  ONSET DATE: Since 2018  SUBJECTIVE:  "I'm going to urgent care after this".  Pt reports continued pain from R ribs to back into buttocks.  Not able to get into PCP until 12/8.     SUBJECTIVE STATEMENT: Left TKR x ~3 years ago with good results, Dec 2022 back surgery with complication of infection, on prophylactic dose of antibiotics x 1 year as per infectious disease will end in 4/24 then is planning on r TKE.  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 Yes: NPRS scale: 10/10 Pain location: see subjective Pain description: ache Aggravating factors: movement or stillness Relieving factors: hydrocodone    PRECAUTIONS: Knee and Back  WEIGHT BEARING RESTRICTIONS No  FALLS:  Has patient fallen in last 6 months? 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 XR right knee: Moderately advanced tricompartmental DJD.  PATIENT SURVEYS:  FOTO 36 with goal of 7  COGNITION:  Overall cognitive status: Within functional limits for tasks assessed     SENSATION: Perphreal nueropathies right foot   POSTURE:  right valgus deformity   LOWER EXTREMITY ROM:                       Right knee: 0-110  LOWER EXTREMITY MMT:  MMT Right eval Left eval  Hip flexion 4- 4  Hip extension    Hip abduction 4 5  Hip adduction 4 5  Hip internal rotation    Hip external rotation    Knee flexion 4 4+  Knee extension    Ankle dorsiflexion    Ankle plantarflexion    Ankle inversion    Ankle eversion     (Blank rows = not tested)  UE ROM  RIGHT: Abd and flex:full with P! At end range  Flex  str: 3- Elbow 5/5  LEFT: rom wfl.  Str3+/5   FUNCTIONAL TESTS:  3.5 reps30 seconds chair stand test Timed up and go (TUG): 24.27 Berg Balance Scale: 31/56  GAIT: Distance walked: 500 Assistive device utilized: Single point cane Level of  assistance: Complete Independence Comments: lateral deviation with decreased right knee flex.  Cane in [place] (needs to be left), decreased step length and cadence.    TODAY'S TREATMENT: Pt seen for aquatic therapy today.  Treatment took place in water 3.25-4.75 ft in depth at the [facility]. Temp of water was 92.  Pt entered/exited the pool independently via stairs using alternating pattern with bilat hand rail  *holding white barbell for improved posture:  walking forward/backwards/ side stepping/ marching forward and backwards * side stepping with arm addct with rainbow hand floats *Straddling noodle holding yellow hand floats: cycling; add/abd and skiing  - 2 long rounds * R hip flexor stretch with  R arm overhead reach;  L/R overhead reach for side stretch;  rainbow hand floats under water with Lateral neck flexion * rainbow hand floats at side with walking forward/ backwards * L stretch holding wall * trunk rotation with arms resting on yellow noodle, cues for head to follow arms * ab set with short blue noodle pull down * return to backwards/ forward walking with arm swing  Pt requires the buoyancy and hydrostatic pressure of water for support, and to offload joints by unweighting joint load by at least 50 % in navel deep water and by at least 75-80% in chest to neck deep water.  Viscosity of the water is needed for resistance of strengthening. Water current perturbations provides challenge to standing balance requiring increased core activation.  * after dried off: applied I strip of [place] tape in zigzag pattern over incision of lumbar spine to desensitize and assist with scar mobilization.    PATIENT EDUCATION:  Education details: Discussed eval findings, rehab rationale and POC and patient is in agreement Person educated: Patient Education method: Explanation Education comprehension: verbalized understanding   HOME EXERCISE PROGRAM: From previous  episode  ASSESSMENT:  CLINICAL IMPRESSION: Pt reported "crunching" sensation in R ant ribs when trying to hold kick board in front of her; this sensation ceased when switching to resting arms on noodle for trunk rotation.  Included gentle stretches for lats, hip flexor, neck with cues for breathing/ relaxation with good tolerance.  Pt reported elimination of pain afterwards.  Goals ongoing.   Plan to progress to land based treatment after ~4 weeks of aquatics.     OBJECTIVE IMPAIRMENTS Abnormal gait, decreased activity tolerance, decreased balance, decreased mobility, difficulty walking, decreased strength, and pain.   ACTIVITY LIMITATIONS lifting, bending, squatting, stairs, transfers, bed mobility, and locomotion level  PARTICIPATION LIMITATIONS: cleaning, shopping, community activity, and yard work  [place], Past/current experiences, and 1-2 comorbidities: Lumbar dysfunction, OA,   are also affecting patient's functional outcome.   REHAB POTENTIAL: Good  CLINICAL DECISION MAKING: Evolving/moderate complexity  EVALUATION COMPLEXITY: Moderate   GOALS: Goals reviewed with patient? Yes  SHORT TERM GOALS: Target date:  Pt will initiate access to pool/gym in order to be compliant with final HEP land and aquatic based Baseline:no access Goal status: INITIAL  2.  Pt to be ale to tolerate amb up towards 15 mins to demonstrate improved toleration to activity Baseline: 7 Goal status: INITIAL  3.  Pt will improve on Berg balance test to 40/46 Baseline: 31/56 Goal status: INITIAL  4.  Pt will improve on Right hip and knee flex to 1/2  grade without pain with resistance Baseline: 4- to 4/5 Goal status: INITIAL  LONG TERM GOALS: Target date:  Pt to meet Foto goal of 49% Baseline: 36 Goal status: INITIAL  2.  Pt to be indep with final HEP Baseline:  Goal status: INITIAL  3.  Pt will improve on 30s STS test to 7 or> to demonstrate improved le  strength Baseline: 3.5 Goal status: INITIAL  4.  Pt will improve in bilat shoulder str up to or > 1 full grade Baseline: 3- to 3+/5 Goal status: INITIAL     PLAN: PT FREQUENCY: 1-2x/week  PT DURATION: 8 weeks  PLANNED INTERVENTIONS: Therapeutic exercises, Therapeutic activity, Neuromuscular re-education, Balance training, Gait training, Patient/Family education, Self Care, Joint mobilization, Joint manipulation, Stair training, Aquatic Therapy, Dry Needling, Cryotherapy, Moist heat, Taping, Ultrasound, Manual therapy, and Re-evaluation  PLAN FOR NEXT SESSION: continue RLE  strengthening, core and bilat shoulder strengthening, balance retraining. Assess response to ktape application; if tolerated - retape with pt in more upright position (vs leaning forward)   Kerin Perna, PTA 01/22/22 11:20 AM [place], [place], [address] Phone: [phone]   Fax:  [phone]

## 2022-01-22 NOTE — ED Triage Notes (Addendum)
Pt is present today with rib pain. Pt states she noticed the pain one month ago but last Sunday is when she noticed the pain becoming worse. Pt denies any injury or SOB

## 2022-01-22 NOTE — Discharge Instructions (Signed)
You have a fracture to your ninth rib.  Recommend ice application and pain medication as you have already been prescribed.  Follow-up with orthopedist if pain persists or worsens.  Recommend deep breathing techniques as we discussed as well.

## 2022-01-22 NOTE — ED Provider Notes (Signed)
[name]    CSN: [id number] Arrival date & time: 01/22/22  1215      History   Chief Complaint Chief Complaint  Patient presents with   Chest Pain    HPI Genola Yuille Isaacks is a 71 y.o. female.   Patient presents with right-sided rib pain that started about a month ago.  Patient reports that she was reaching across and heard a popping sound in that area at that time.  She has been having intermittent pain ever since.  She states that the pain has seemed to worsened over the past few days.  She denies any associated shortness of breath.  Has taken norco with minimal improvement in pain.    Chest Pain   Past Medical History:  Diagnosis Date   Chronic kidney disease    Depression    Fibromyalgia    Gout    HTN (hypertension)    Insomnia    Morbid obesity ([place])    Osteoarthritis    Palpitation    Sleep apnea     Patient Active Problem List   Diagnosis Date Noted   Acute on chronic renal insufficiency 03/27/2021   Postoperative complication of skin involving drainage from surgical wound 03/25/2021   Post op infection 03/24/2021   S/P lumbar fusion 03/24/2021   Lumbar stenosis 03/09/2021   Degenerative spondylolisthesis 02/19/2021   Primary osteoarthritis of left knee 09/08/2020   DJD (degenerative joint disease) of knee 09/08/2020   Status post total left knee replacement 09/08/2020   Spondylosis without myelopathy or radiculopathy, lumbar region 07/17/2020   Chronic pain syndrome 07/17/2020   Myalgia 07/17/2020   OSA (obstructive sleep apnea) 05/19/2020   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/17/2020   Sleep disturbance 04/17/2020   Chronic pain of both shoulders 03/03/2020   Pain in both hands 03/03/2020   Primary osteoarthritis of both knees 03/03/2020   Pain in both feet 03/03/2020   History of chronic kidney disease 03/03/2020   DDD (degenerative disc disease), lumbar 02/05/2020   Vitamin D deficiency 01/09/2020    Hyperlipidemia 01/09/2020   CKD (chronic kidney disease) stage 3, GFR 30-59 ml/min ([place]) 01/09/2020   HTN (hypertension)    Morbid obesity ([facility])    Gout    Fibromyalgia    Depression    Insomnia     Past Surgical History:  Procedure Laterality Date   APPLICATION OF WOUND VAC N/A 03/25/2021   Procedure: APPLICATION OF WOUND VAC;  Surgeon: [name], MD;  Location: [place];  Service: Orthopedics;  Laterality: N/A;   APPLICATION OF WOUND VAC N/A 03/30/2021   Procedure: WOUND VAC CHANGE 12x6x5;  Surgeon: [name], MD;  Location: [place];  Service: Orthopedics;  Laterality: N/A;   INCISION AND DRAINAGE OF WOUND N/A 03/25/2021   Procedure: LUMBAR POST OP INCISION IRRIGATION;  Surgeon: [name], MD;  Location: [place];  Service: Orthopedics;  Laterality: N/A;   JOINT REPLACEMENT     KNEE ARTHROSCOPY     LUMBAR WOUND DEBRIDEMENT N/A 03/30/2021   Procedure: REPEAT LUMBAR WOUND DEBRIDEMENT;  Surgeon: [name], MD;  Location: [place];  Service: Orthopedics;  Laterality: N/A;   right rotator cuff     ROTATOR CUFF REPAIR Left    TOTAL KNEE ARTHROPLASTY Left 09/08/2020   Procedure: LEFT TOTAL KNEE ARTHROPLASTY;  Surgeon: Leandrew Koyanagi, MD;  Location: [place];  Service: Orthopedics;  Laterality: Left;   TUBAL LIGATION      OB History  No obstetric history on file.      Home Medications    Prior to Admission medications   Medication Sig Start Date End Date Taking? Authorizing Provider  allopurinol (ZYLOPRIM) 100 MG tablet Take 2 tablets (200 mg total) by mouth daily. 10/14/21   Isaac Bliss, [name], MD  amitriptyline (ELAVIL) 10 MG tablet TAKE 1 TABLET BY MOUTH DAILY AT  BEDTIME 12/28/21   Raulkar, Clide Deutscher, MD  amLODipine (NORVASC) 5 MG tablet TAKE 1 TABLET (5 MG TOTAL) BY MOUTH DAILY. 07/27/21   Isaac Bliss, [name], MD  amoxicillin-clavulanate (AUGMENTIN) 500-125 MG tablet Take 1 tablet (500 mg total) by mouth in the morning and at bedtime. 11/03/21   Laurice Record, MD   atorvastatin (LIPITOR) 10 MG tablet TAKE 1 TABLET BY MOUTH EVERYDAY AT BEDTIME 05/27/21   Isaac Bliss, [name], MD  DULoxetine (CYMBALTA) 20 MG capsule TAKE 1 CAPSULE BY MOUTH DAILY 11/18/21   Bayard Hugger, NP  eszopiclone (LUNESTA) 2 MG TABS tablet TAKE 1 TABLET BY MOUTH AT NIGHT IMMEDIATELY BEFORE BEDTIME 12/16/21   Isaac Bliss, [name], MD  fluticasone (FLONASE) 50 MCG/ACT nasal spray Place 1 spray into both nostrils daily as needed for allergies.    [provider]  HYDROcodone-acetaminophen (NORCO) 7.5-325 MG tablet Take 1 tablet by mouth 2 (two) times daily as needed for moderate pain. 01/19/22   Raulkar, Clide Deutscher, MD  hydrocortisone (ANUSOL-HC) 2.5 % rectal cream Place 1 Application rectally 2 (two) times daily. 10/14/21   Isaac Bliss, [name], MD  levofloxacin (LEVAQUIN) 500 MG tablet TAKE 1 TABLET BY MOUTH DAILY 12/28/21   Laurice Record, MD  loratadine (CLARITIN) 10 MG tablet Take 10 mg by mouth daily as needed for allergies.    [provider]  losartan (COZAAR) 50 MG tablet TAKE 1 TABLET BY MOUTH EVERY DAY 01/19/22   Isaac Bliss, [name], MD  predniSONE (DELTASONE) 5 MG tablet Take 4 tabs po x 2 days, 3  tabs po x 2 days, 2 tabs po x 2 days, 1  tab po x 2 days 12/31/21   Bo Merino, MD  pregabalin (LYRICA) 75 MG capsule Take 1 capsule (75 mg total) by mouth 3 (three) times daily. 10/14/21   Raulkar, Clide Deutscher, MD  Vitamin D, Ergocalciferol, (DRISDOL) 1.25 MG (50000 UNIT) CAPS capsule TAKE 1 CAPSULE (50,000 UNITS TOTAL) BY MOUTH EVERY 7 (SEVEN) DAYS 12/21/21   Raulkar, Clide Deutscher, MD    Family History Family History  Problem Relation Age of Onset   Hypothyroidism Mother    Hyperlipidemia Other    Kidney failure Father    Dementia Father    Gout Father    Arthritis Father    Prostate cancer Father     Social History Social History   Tobacco Use   Smoking status: Never    Passive exposure: Current   Smokeless tobacco: Never   Vaping Use   Vaping Use: Never used  Substance Use Topics   Alcohol use: Never    Alcohol/week: 0.0 standard drinks of alcohol   Drug use: Never     Allergies   Cefuroxime axetil, Clarithromycin, Nsaids, Cefuroxime, Robaxin [methocarbamol], Toradol [ketorolac tromethamine], and Tramadol   Review of Systems Review of Systems Per HPI  Physical Exam Triage Vital Signs ED Triage Vitals [01/22/22 1231]  Enc Vitals Group     BP 133/83     Pulse Rate 63     Resp 17     Temp 98.3 F (  36.8 C)     Temp src      SpO2 94 %     Weight      Height      Head Circumference      Peak Flow      Pain Score 8     Pain Loc      Pain Edu?      Excl. in [place]?    No data found.  Updated Vital Signs BP 133/83   Pulse 63   Temp 98.3 F (36.8 C)   Resp 17   SpO2 95%   Visual Acuity Right Eye Distance:   Left Eye Distance:   Bilateral Distance:    Right Eye Near:   Left Eye Near:    Bilateral Near:     Physical Exam Constitutional:      General: She is not in acute distress.    Appearance: Normal appearance. She is not toxic-appearing or diaphoretic.  HENT:     Head: Normocephalic and atraumatic.  Eyes:     Extraocular Movements: Extraocular movements intact.     Conjunctiva/sclera: Conjunctivae normal.  Cardiovascular:     Rate and Rhythm: Normal rate and regular rhythm.     Pulses: Normal pulses.     Heart sounds: Normal heart sounds.  Pulmonary:     Effort: Pulmonary effort is normal. No respiratory distress.     Breath sounds: Normal breath sounds.  Chest:       Comments: Tenderness to palpation to mid to lower right ribs.  No obvious swelling, discoloration, lacerations, abrasions noted.  No crepitus noted. Neurological:     General: No focal deficit present.     Mental Status: She is alert and oriented to person, place, and time. Mental status is at baseline.  Psychiatric:        Mood and Affect: Mood normal.        Behavior: Behavior normal.        Thought  Content: Thought content normal.        Judgment: Judgment normal.      UC Treatments / Results  Labs (all labs ordered are listed, but only abnormal results are displayed) Labs Reviewed - No data to display  EKG   Radiology DG Ribs Unilateral W/Chest Right  Result Date: 01/22/2022 CLINICAL DATA:  Rib pain for 1 month, worsening EXAM: RIGHT RIBS AND CHEST - 3+ VIEW COMPARISON:  09/02/2020 FINDINGS: Frontal view of the chest as well as frontal and oblique views of the right thoracic cage are obtained. Cardiac silhouette is unremarkable. No airspace disease, effusion, or pneumothorax. There is a subacute healing right anterolateral ninth rib fracture, with moderate callus formation. No other acute bony abnormalities. Postsurgical changes lumbar spine. IMPRESSION: 1. Subacute healing right anterolateral ninth rib fracture. 2. No acute intrathoracic process. Electronically Signed   By: Randa Ngo M.D.   On: 01/22/2022 12:56    Procedures Procedures (including critical care time)  Medications Ordered in UC Medications - No data to display  Initial Impression / Assessment and Plan / UC Course  I have reviewed the triage vital signs and the nursing notes.  Pertinent labs & imaging results that were available during my care of the patient were reviewed by me and considered in my medical decision making (see chart for details).     Chest x-ray showing subacute ninth rib fracture that is nondisplaced.  It appears to be healing.  This is most likely cause of patient's pain.  Advised  patient to continue Norco as needed that was previously prescribed by healthcare provider.  Also recommended ice application and supportive care.  Educated patient on deep breathing techniques.  Advised patient to follow-up with orthopedist at provided contact information if symptoms persist or worsen, although she was educated that rib fractures typically heal on their own and do not need any further  intervention.  Discussed return and ER precautions.  Patient verbalized understanding and was agreeable with plan. Final Clinical Impressions(s) / UC Diagnoses   Final diagnoses:  Closed fracture of one rib of right side with routine healing     Discharge Instructions      You have a fracture to your ninth rib.  Recommend ice application and pain medication as you have already been prescribed.  Follow-up with orthopedist if pain persists or worsens.  Recommend deep breathing techniques as we discussed as well.     ED Prescriptions   None    PDMP not reviewed this encounter.   Teodora Medici, [place] 01/22/22 1345

## 2022-01-24 ENCOUNTER — Encounter (HOSPITAL_BASED_OUTPATIENT_CLINIC_OR_DEPARTMENT_OTHER): Payer: Self-pay | Admitting: Physical Therapy

## 2022-01-25 NOTE — Therapy (Signed)
OUTPATIENT PHYSICAL THERAPY LOWER EXTREMITY TREATMENT   Patient Name: Amy Hooper MRN: [id number] DOB:1950-04-08, 71 y.o., female Today's Date: 01/26/2022   PT End of Session - 01/26/22 1612     Visit Number 7    Number of Visits 16    Date for PT Re-Evaluation 02/18/22    Authorization Type [employer]    PT Start Time 1550    PT Stop Time 8338    PT Time Calculation (min) 45 min    Activity Tolerance Patient limited by pain;Patient tolerated treatment well    Behavior During Therapy [facility] for tasks assessed/performed               Past Medical History:  Diagnosis Date   Chronic kidney disease    Depression    Fibromyalgia    Gout    HTN (hypertension)    Insomnia    Morbid obesity ([place])    Osteoarthritis    Palpitation    Sleep apnea    Past Surgical History:  Procedure Laterality Date   APPLICATION OF WOUND VAC N/A 03/25/2021   Procedure: APPLICATION OF WOUND VAC;  Surgeon: [name], MD;  Location: [place];  Service: Orthopedics;  Laterality: N/A;   APPLICATION OF WOUND VAC N/A 03/30/2021   Procedure: WOUND VAC CHANGE 12x6x5;  Surgeon: [name], MD;  Location: [place];  Service: Orthopedics;  Laterality: N/A;   INCISION AND DRAINAGE OF WOUND N/A 03/25/2021   Procedure: LUMBAR POST OP INCISION IRRIGATION;  Surgeon: [name], MD;  Location: [place];  Service: Orthopedics;  Laterality: N/A;   JOINT REPLACEMENT     KNEE ARTHROSCOPY     LUMBAR WOUND DEBRIDEMENT N/A 03/30/2021   Procedure: REPEAT LUMBAR WOUND DEBRIDEMENT;  Surgeon: [name], MD;  Location: [place];  Service: Orthopedics;  Laterality: N/A;   right rotator cuff     ROTATOR CUFF REPAIR Left    TOTAL KNEE ARTHROPLASTY Left 09/08/2020   Procedure: LEFT TOTAL KNEE ARTHROPLASTY;  Surgeon: Leandrew Koyanagi, MD;  Location: [place];  Service: Orthopedics;  Laterality: Left;   TUBAL LIGATION     Patient Active Problem List   Diagnosis Date Noted   Acute on chronic renal insufficiency 03/27/2021    Postoperative complication of skin involving drainage from surgical wound 03/25/2021   Post op infection 03/24/2021   S/P lumbar fusion 03/24/2021   Lumbar stenosis 03/09/2021   Degenerative spondylolisthesis 02/19/2021   Primary osteoarthritis of left knee 09/08/2020   DJD (degenerative joint disease) of knee 09/08/2020   Status post total left knee replacement 09/08/2020   Spondylosis without myelopathy or radiculopathy, lumbar region 07/17/2020   Chronic pain syndrome 07/17/2020   Myalgia 07/17/2020   OSA (obstructive sleep apnea) 05/19/2020   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/17/2020   Sleep disturbance 04/17/2020   Chronic pain of both shoulders 03/03/2020   Pain in both hands 03/03/2020   Primary osteoarthritis of both knees 03/03/2020   Pain in both feet 03/03/2020   History of chronic kidney disease 03/03/2020   DDD (degenerative disc disease), lumbar 02/05/2020   Vitamin D deficiency 01/09/2020   Hyperlipidemia 01/09/2020   CKD (chronic kidney disease) stage 3, GFR 30-59 ml/min ([place]) 01/09/2020   HTN (hypertension)    Morbid obesity ([place])    Gout    Fibromyalgia    Depression    Insomnia     PCP: Isaac Bliss, [name], MD  REFERRING PROVIDER: Izora Ribas, MD  REFERRING  DIAG: M25.561,G89.29 (ICD-10-CM) - Chronic pain of right knee  THERAPY DIAG:  Chronic pain of right knee  Fibromyalgia  Muscle weakness-general  Other low back pain  Rationale for Evaluation and Treatment Rehabilitation  ONSET DATE: Since 2018  SUBJECTIVE:  Pt with a right 9th rib fx. Reports pain form right hip up to rib.  "I'm tired have had a lot of pain"    IMAGING 01/22/22  IMPRESSION: 1. Subacute healing right anterolateral ninth rib fracture. 2. No acute intrathoracic process.  SUBJECTIVE STATEMENT: Left TKR x ~3 years ago with good results, Dec 2022 back surgery with complication of infection, on prophylactic dose of antibiotics x 1 year as per  infectious disease will end in 4/24 then is planning on r TKE.  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 Yes: NPRS scale: 10/10 Pain location: see subjective Pain description: ache Aggravating factors: movement or stillness Relieving factors: hydrocodone    PRECAUTIONS: Knee and Back  WEIGHT BEARING RESTRICTIONS No  FALLS:  Has patient fallen in last 6 months? 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 XR right knee: Moderately advanced tricompartmental DJD.  PATIENT SURVEYS:  FOTO 36 with goal of 51  COGNITION:  Overall cognitive status: Within functional limits for tasks assessed     SENSATION: Perphreal nueropathies right foot   POSTURE:  right valgus deformity   LOWER EXTREMITY ROM:                       Right knee: 0-110  LOWER EXTREMITY MMT:  MMT Right eval Left eval  Hip flexion 4- 4  Hip extension    Hip abduction 4 5  Hip adduction 4 5  Hip internal rotation    Hip external rotation    Knee flexion 4 4+  Knee extension    Ankle dorsiflexion    Ankle plantarflexion    Ankle inversion    Ankle eversion     (Blank rows = not tested)  UE ROM  RIGHT: Abd and flex:full with P! At end range  Flex  str: 3- Elbow 5/5  LEFT: rom wfl.  Str3+/5   FUNCTIONAL TESTS:  3.5 reps30 seconds chair stand test Timed up and go (TUG):  24.27 Berg Balance Scale: 31/56  GAIT: Distance walked: 500 Assistive device utilized: Single point cane Level of assistance: Complete Independence Comments: lateral deviation with decreased right knee flex.  Cane in [place] (needs to be left), decreased step length and cadence.    TODAY'S TREATMENT: Pt seen for aquatic therapy today.  Treatment took place in water 3.25-4.75 ft in depth at the [facility]. Temp of water was 92.  Pt entered/exited the pool independently via stairs using alternating pattern with bilat hand rail   *Straddling noodle holding yellow hand floats: cycling; add/abd and skiing  Multiple widths/reps * rainbow hand floats at side with walking forward/ backwards multiple widths *UE standing using  1  foam hand buoys: horizontal add/abd; add/abd; flex/ext (0-90) * backwards/ forward walking with arm swing  Pt requires the buoyancy and hydrostatic pressure of water for support, and to offload joints by unweighting joint load by at least 50 % in navel deep water and by at least 75-80% in chest to neck deep water.  Viscosity of the water is needed for resistance of strengthening. Water current perturbations provides challenge to standing balance requiring increased core activation.  * after dried off: applied I strip of reg [place] tape in zigzag pattern over incision of lumbar spine to desensitize and assist with scar mobilization.  Istripe of sensitive skin tape to lower rib lateral to anterior ribcage   PATIENT EDUCATION:  Education details: Discussed eval findings, rehab rationale and POC and patient is in agreement Person educated: Patient Education method: Explanation Education comprehension: verbalized understanding   HOME EXERCISE PROGRAM: From previous episode  ASSESSMENT:  CLINICAL IMPRESSION: Pt with subacute 9th rib fx. She has had discomfort in past few weeks with increasing pain beginning last week. Pt state initially she rolled over in  bed and heard a crunch.  She requests decreased frequency of sessions to 1 x weekly for the next couple of weeks.  She was scheduled to begin land based session next week which we will postpone until next month allowing time for healing of fx to ensure good toleration of therapy.  Session focused on gentle movement with ue slightly resisted ex and core engagement to encourage healing. Goals ongoing.    OBJECTIVE IMPAIRMENTS Abnormal gait, decreased activity tolerance, decreased balance, decreased mobility, difficulty walking, decreased strength, and pain.   ACTIVITY LIMITATIONS lifting, bending, squatting, stairs, transfers, bed mobility, and locomotion level  PARTICIPATION LIMITATIONS: cleaning, shopping, community activity, and yard work  [place], Past/current experiences, and 1-2 comorbidities: Lumbar dysfunction, OA,   are also affecting patient's functional outcome.   REHAB POTENTIAL: Good  CLINICAL DECISION MAKING: Evolving/moderate complexity  EVALUATION COMPLEXITY: Moderate   GOALS: Goals reviewed with patient? Yes  SHORT TERM GOALS: Target date: 01/21/22 Pt will initiate access to pool/gym in order to be compliant with final HEP land and aquatic based Baseline:no access Goal status: Ongoing  2.  Pt to be ale to tolerate amb up towards 15 mins to demonstrate improved toleration to activity Baseline: 7 Goal status: Ongoing  3.  Pt will improve on Berg balance test to 40/46 Baseline: 31/56 Goal status: INITIAL  4.  Pt will improve on Right hip and knee flex to 1/2  grade without pain with resistance Baseline: 4- to 4/5 Goal status: ongoing  LONG TERM GOALS: Target date:02/18/22  Pt to meet Foto goal of 49% Baseline: 36 Goal status: INITIAL  2.  Pt to be indep with final HEP Baseline:  Goal status: INITIAL  3.  Pt will improve on 30s STS test to 7 or> to demonstrate improved le strength Baseline: 3.5 Goal status: INITIAL  4.  Pt will improve  in bilat shoulder str up to or > 1 full grade Baseline: 3- to 3+/5 Goal status: INITIAL     PLAN: PT FREQUENCY: 1-2x/week  PT DURATION: 8 weeks  PLANNED INTERVENTIONS: Therapeutic exercises, Therapeutic activity, Neuromuscular re-education, Balance training, Gait training, Patient/Family education, Self Care, Joint mobilization, Joint manipulation, Stair training, Aquatic Therapy, Dry Needling, Cryotherapy, Moist heat, Taping, Ultrasound, Manual therapy, and Re-evaluation  PLAN FOR NEXT SESSION: continue RLE strengthening, core and bilat shoulder strengthening, balance retraining. Assess response to ktape application; if tolerated -  retape with pt in more upright position (vs leaning forward)   Annamarie Major) Ameya Kutz MPT 01/26/22 4:33 PM [facility] [address] [place], [place], [address] Phone: [phone]   Fax:  [phone]

## 2022-01-26 ENCOUNTER — Encounter (HOSPITAL_BASED_OUTPATIENT_CLINIC_OR_DEPARTMENT_OTHER): Payer: Self-pay | Admitting: Physical Therapy

## 2022-01-26 ENCOUNTER — Ambulatory Visit (HOSPITAL_BASED_OUTPATIENT_CLINIC_OR_DEPARTMENT_OTHER): Payer: Commercial Managed Care - PPO | Admitting: Physical Therapy

## 2022-01-26 DIAGNOSIS — M797 Fibromyalgia: Secondary | ICD-10-CM

## 2022-01-26 DIAGNOSIS — M6281 Muscle weakness (generalized): Secondary | ICD-10-CM

## 2022-01-26 DIAGNOSIS — G8929 Other chronic pain: Secondary | ICD-10-CM

## 2022-01-26 DIAGNOSIS — M25561 Pain in right knee: Secondary | ICD-10-CM | POA: Diagnosis not present

## 2022-01-26 DIAGNOSIS — M5459 Other low back pain: Secondary | ICD-10-CM

## 2022-01-28 ENCOUNTER — Ambulatory Visit (HOSPITAL_BASED_OUTPATIENT_CLINIC_OR_DEPARTMENT_OTHER): Payer: Commercial Managed Care - PPO | Admitting: Physical Therapy

## 2022-01-28 ENCOUNTER — Encounter (HOSPITAL_BASED_OUTPATIENT_CLINIC_OR_DEPARTMENT_OTHER): Payer: Self-pay | Admitting: Physical Therapy

## 2022-01-28 DIAGNOSIS — M5459 Other low back pain: Secondary | ICD-10-CM

## 2022-01-28 DIAGNOSIS — M6281 Muscle weakness (generalized): Secondary | ICD-10-CM

## 2022-01-28 DIAGNOSIS — M25561 Pain in right knee: Secondary | ICD-10-CM | POA: Diagnosis not present

## 2022-01-28 DIAGNOSIS — M797 Fibromyalgia: Secondary | ICD-10-CM

## 2022-01-28 DIAGNOSIS — G8929 Other chronic pain: Secondary | ICD-10-CM

## 2022-01-28 NOTE — Therapy (Signed)
OUTPATIENT PHYSICAL THERAPY LOWER EXTREMITY TREATMENT   Patient Name: Amy Hooper MRN: [id number] DOB:May 02, 1950, 71 y.o., female Today's Date: 01/28/2022   PT End of Session - 01/28/22 1702     Visit Number 8    Number of Visits 16    Date for PT Re-Evaluation 02/18/22    Authorization Type UHC    PT Start Time 9563    PT Stop Time 1700    PT Time Calculation (min) 44 min    Activity Tolerance Patient tolerated treatment well    Behavior During Therapy WFL for tasks assessed/performed                Past Medical History:  Diagnosis Date   Chronic kidney disease    Depression    Fibromyalgia    Gout    HTN (hypertension)    Insomnia    Morbid obesity ([place])    Osteoarthritis    Palpitation    Sleep apnea    Past Surgical History:  Procedure Laterality Date   APPLICATION OF WOUND VAC N/A 03/25/2021   Procedure: APPLICATION OF WOUND VAC;  Surgeon: [name], MD;  Location: [facility];  Service: Orthopedics;  Laterality: N/A;   APPLICATION OF WOUND VAC N/A 03/30/2021   Procedure: WOUND VAC CHANGE 12x6x5;  Surgeon: [name], MD;  Location: [facility];  Service: Orthopedics;  Laterality: N/A;   INCISION AND DRAINAGE OF WOUND N/A 03/25/2021   Procedure: LUMBAR POST OP INCISION IRRIGATION;  Surgeon: [name], MD;  Location: [facility];  Service: Orthopedics;  Laterality: N/A;   JOINT REPLACEMENT     KNEE ARTHROSCOPY     LUMBAR WOUND DEBRIDEMENT N/A 03/30/2021   Procedure: REPEAT LUMBAR WOUND DEBRIDEMENT;  Surgeon: [name], MD;  Location: [facility];  Service: Orthopedics;  Laterality: N/A;   right rotator cuff     ROTATOR CUFF REPAIR Left    TOTAL KNEE ARTHROPLASTY Left 09/08/2020   Procedure: LEFT TOTAL KNEE ARTHROPLASTY;  Surgeon: Leandrew Koyanagi, MD;  Location: [facility];  Service: Orthopedics;  Laterality: Left;   TUBAL LIGATION     Patient Active Problem List   Diagnosis Date Noted   Acute on chronic renal insufficiency 03/27/2021   Postoperative complication  of skin involving drainage from surgical wound 03/25/2021   Post op infection 03/24/2021   S/P lumbar fusion 03/24/2021   Lumbar stenosis 03/09/2021   Degenerative spondylolisthesis 02/19/2021   Primary osteoarthritis of left knee 09/08/2020   DJD (degenerative joint disease) of knee 09/08/2020   Status post total left knee replacement 09/08/2020   Spondylosis without myelopathy or radiculopathy, lumbar region 07/17/2020   Chronic pain syndrome 07/17/2020   Myalgia 07/17/2020   OSA (obstructive sleep apnea) 05/19/2020   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/17/2020   Sleep disturbance 04/17/2020   Chronic pain of both shoulders 03/03/2020   Pain in both hands 03/03/2020   Primary osteoarthritis of both knees 03/03/2020   Pain in both feet 03/03/2020   History of chronic kidney disease 03/03/2020   DDD (degenerative disc disease), lumbar 02/05/2020   Vitamin D deficiency 01/09/2020   Hyperlipidemia 01/09/2020   CKD (chronic kidney disease) stage 3, GFR 30-59 ml/min ([place]) 01/09/2020   HTN (hypertension)    Morbid obesity ([place])    Gout    Fibromyalgia    Depression    Insomnia     PCP: Isaac Bliss, [name], MD  REFERRING PROVIDER: Izora Ribas, MD  REFERRING DIAG: [phone] (  ICD-10-CM) - Chronic pain of right knee  THERAPY DIAG:  Chronic pain of right knee  Fibromyalgia  Muscle weakness-general  Other low back pain  Rationale for Evaluation and Treatment Rehabilitation  ONSET DATE: Since 2018  SUBJECTIVE:  Pt reports rib pain improved but knee "is cuttin up"  IMAGING 01/22/22  IMPRESSION: 1. Subacute healing right anterolateral ninth rib fracture. 2. No acute intrathoracic process.  SUBJECTIVE STATEMENT: Left TKR x ~3 years ago with good results, Dec 2022 back surgery with complication of infection, on prophylactic dose of antibiotics x 1 year as per infectious disease will end in 4/24 then is planning on r TKE.  Pt reports residual  weakness from back infection ue and core.Perpheal neuropthies right foot (numbness, pain)residual from lumbar fusion. 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 Yes: NPRS scale: 7/10 right knee; right 9th rib area 5/10 Pain location: see subjective Pain description: ache Aggravating factors: movement or stillness Relieving factors: hydrocodone    PRECAUTIONS: Knee and Back  WEIGHT BEARING RESTRICTIONS No  FALLS:  Has patient fallen in last 6 months? 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 XR right knee: Moderately advanced tricompartmental DJD.  PATIENT SURVEYS:  FOTO 36 with goal of 48  COGNITION:  Overall cognitive status: Within functional limits for tasks assessed     SENSATION: Perphreal nueropathies right foot   POSTURE:  right valgus deformity   LOWER EXTREMITY ROM:                       Right knee: 0-110  LOWER EXTREMITY MMT:  MMT Right eval Left eval  Hip flexion 4- 4  Hip extension    Hip abduction 4 5  Hip adduction 4 5  Hip internal rotation    Hip external rotation    Knee flexion 4 4+  Knee extension    Ankle dorsiflexion    Ankle plantarflexion    Ankle inversion    Ankle eversion     (Blank rows = not tested)  UE ROM  RIGHT: Abd and flex:full with P! At end range  Flex  str: 3- Elbow 5/5  LEFT: rom wfl.  Str3+/5   FUNCTIONAL TESTS:  3.5 reps30 seconds chair stand test Timed up and go (TUG): 24.27 Berg Balance Scale: 31/56  GAIT: Distance  walked: 500 Assistive device utilized: Single point cane Level of assistance: Complete Independence Comments: lateral deviation with decreased right knee flex.  Cane in [place] (needs to be left), decreased step length and cadence.    TODAY'S TREATMENT: Pt seen for aquatic therapy today.  Treatment took place in water 3.25-4.75 ft in depth at the [place]. Temp of water was 92.  Pt entered/exited the pool independently via stairs using alternating pattern with bilat hand rail   *Straddling noodle holding yellow hand floats: cycling; add/abd and skiing  Multiple widths/reps*standing ue supported on wall: df; pf; squats; SL df then PF. *L stretch 3 x 20s hold; 4th reps with hip hike/side bending * rainbow hand  floats at side with walking forward/ backwards multiple widths *SLS ue support 1 foam hand buoyR/L. Pt able to ho,d >20 ea although with unsteadiness on R *UE standing using 1  foam hand buoys: horizontal add/abd; add/abd; flex/ext (0-90) *thoracic rotation R/L *Side lunges with ue add/abd using 1 foam hand buoys x 2 widths  Pt requires the buoyancy and hydrostatic pressure of water for support, and to offload joints by unweighting joint load by at least 50 % in navel deep water and by at least 75-80% in chest to neck deep water.  Viscosity of the water is needed for resistance of strengthening. Water current perturbations provides challenge to standing balance requiring increased core activation.  * after dried off: applied I strip of reg Rock tape in zigzag pattern over incision of lumbar spine to desensitize and assist with scar mobilization.  Istripe of sensitive skin tape to lower rib lateral to anterior ribcage   PATIENT EDUCATION:  Education details: Discussed eval findings, rehab rationale and POC and patient is in agreement Person educated: Patient Education method: Explanation Education comprehension: verbalized understanding   HOME EXERCISE PROGRAM: From  previous episode  ASSESSMENT:  CLINICAL IMPRESSION: Pt with decreased 9th rib pain. She is able to tolerate session with significant improvement today. Progressed proprioceptive input with SLS through rle. Completed progressive knee strengthening exercises well. Reduction in knee and rib pain end of session to 0/10 Goals ongoing    OBJECTIVE IMPAIRMENTS Abnormal gait, decreased activity tolerance, decreased balance, decreased mobility, difficulty walking, decreased strength, and pain.   ACTIVITY LIMITATIONS lifting, bending, squatting, stairs, transfers, bed mobility, and locomotion level  PARTICIPATION LIMITATIONS: cleaning, shopping, community activity, and yard work  [place], Past/current experiences, and 1-2 comorbidities: Lumbar dysfunction, OA,   are also affecting patient's functional outcome.   REHAB POTENTIAL: Good  CLINICAL DECISION MAKING: Evolving/moderate complexity  EVALUATION COMPLEXITY: Moderate   GOALS: Goals reviewed with patient? Yes  SHORT TERM GOALS: Target date: 01/21/22 Pt will initiate access to pool/gym in order to be compliant with final HEP land and aquatic based Baseline:no access Goal status: Ongoing  2.  Pt to be ale to tolerate amb up towards 15 mins to demonstrate improved toleration to activity Baseline: 7 Goal status: Ongoing  3.  Pt will improve on Berg balance test to 40/46 Baseline: 31/56 Goal status: INITIAL  4.  Pt will improve on Right hip and knee flex to 1/2  grade without pain with resistance Baseline: 4- to 4/5 Goal status: ongoing  LONG TERM GOALS: Target date:02/18/22  Pt to meet Foto goal of 49% Baseline: 36 Goal status: INITIAL  2.  Pt to be indep with final HEP Baseline:  Goal status: INITIAL  3.  Pt will improve on 30s STS test to 7 or> to demonstrate improved le strength Baseline: 3.5 Goal status: INITIAL  4.  Pt will improve in bilat shoulder str up to or > 1 full grade Baseline: 3- to  3+/5 Goal status: INITIAL     PLAN: PT FREQUENCY: 1-2x/week  PT DURATION: 8 weeks  PLANNED INTERVENTIONS: Therapeutic exercises, Therapeutic activity, Neuromuscular re-education, Balance training, Gait training, Patient/Family education, Self Care, Joint mobilization, Joint manipulation, Stair training, Aquatic Therapy, Dry Needling, Cryotherapy, Moist heat, Taping, Ultrasound, Manual therapy, and Re-evaluation  PLAN FOR NEXT SESSION: continue RLE strengthening, core and bilat shoulder strengthening, balance retraining. Assess response to ktape application; if tolerated - retape with pt in more upright position (vs leaning forward)   Stanton Kidney Tharon Aquas) Jannett Celestine MPT  01/28/22 5:03 PM [facility] [address] [place], [place], [address] Phone: [phone]   Fax:  [phone]

## 2022-02-01 ENCOUNTER — Ambulatory Visit: Payer: Commercial Managed Care - PPO | Admitting: Internal Medicine

## 2022-02-01 ENCOUNTER — Encounter: Payer: Self-pay | Admitting: Internal Medicine

## 2022-02-01 VITALS — BP 128/79 | HR 55 | Temp 97.6°F | Wt 202.2 lb

## 2022-02-01 DIAGNOSIS — S2231XD Fracture of one rib, right side, subsequent encounter for fracture with routine healing: Secondary | ICD-10-CM

## 2022-02-01 DIAGNOSIS — I10 Essential (primary) hypertension: Secondary | ICD-10-CM | POA: Diagnosis not present

## 2022-02-01 DIAGNOSIS — E782 Mixed hyperlipidemia: Secondary | ICD-10-CM

## 2022-02-01 DIAGNOSIS — E559 Vitamin D deficiency, unspecified: Secondary | ICD-10-CM

## 2022-02-01 LAB — LIPID PANEL
Cholesterol: 192 mg/dL (ref 0–200)
HDL: 65.8 mg/dL (ref >39.00)
LDL Cholesterol: 113 mg/dL — ABNORMAL HIGH (ref 0–99)
NonHDL: 126.45
Total CHOL/HDL Ratio: 3
Triglycerides: 66 mg/dL (ref 0.0–149.0)
VLDL: 13.2 mg/dL (ref 0.0–40.0)

## 2022-02-01 LAB — VITAMIN D 25 HYDROXY (VIT D DEFICIENCY, FRACTURES): VITD: 43.23 ng/mL (ref 30.00–100.00)

## 2022-02-01 NOTE — Therapy (Signed)
OUTPATIENT PHYSICAL THERAPY LOWER EXTREMITY TREATMENT   Patient Name: Amy Hooper MRN: [id number] DOB:13-Feb-1951, 71 y.o., female Today's Date: 01/28/2022   PT End of Session - 01/28/22 1702     Visit Number 8    Number of Visits 16    Date for PT Re-Evaluation 02/18/22    Authorization Type UHC    PT Start Time 3419    PT Stop Time 1700    PT Time Calculation (min) 44 min    Activity Tolerance Patient tolerated treatment well    Behavior During Therapy WFL for tasks assessed/performed                Past Medical History:  Diagnosis Date   Chronic kidney disease    Depression    Fibromyalgia    Gout    HTN (hypertension)    Insomnia    Morbid obesity ([place])    Osteoarthritis    Palpitation    Sleep apnea    Past Surgical History:  Procedure Laterality Date   APPLICATION OF WOUND VAC N/A 03/25/2021   Procedure: APPLICATION OF WOUND VAC;  Surgeon: [name], MD;  Location: [facility];  Service: Orthopedics;  Laterality: N/A;   APPLICATION OF WOUND VAC N/A 03/30/2021   Procedure: WOUND VAC CHANGE 12x6x5;  Surgeon: [name], MD;  Location: [facility];  Service: Orthopedics;  Laterality: N/A;   INCISION AND DRAINAGE OF WOUND N/A 03/25/2021   Procedure: LUMBAR POST OP INCISION IRRIGATION;  Surgeon: [name], MD;  Location: [facility];  Service: Orthopedics;  Laterality: N/A;   JOINT REPLACEMENT     KNEE ARTHROSCOPY     LUMBAR WOUND DEBRIDEMENT N/A 03/30/2021   Procedure: REPEAT LUMBAR WOUND DEBRIDEMENT;  Surgeon: [name], MD;  Location: [facility];  Service: Orthopedics;  Laterality: N/A;   right rotator cuff     ROTATOR CUFF REPAIR Left    TOTAL KNEE ARTHROPLASTY Left 09/08/2020   Procedure: LEFT TOTAL KNEE ARTHROPLASTY;  Surgeon: Leandrew Koyanagi, MD;  Location: [facility];  Service: Orthopedics;  Laterality: Left;   TUBAL LIGATION     Patient Active Problem List   Diagnosis Date Noted   Acute on chronic renal insufficiency 03/27/2021   Postoperative complication  of skin involving drainage from surgical wound 03/25/2021   Post op infection 03/24/2021   S/P lumbar fusion 03/24/2021   Lumbar stenosis 03/09/2021   Degenerative spondylolisthesis 02/19/2021   Primary osteoarthritis of left knee 09/08/2020   DJD (degenerative joint disease) of knee 09/08/2020   Status post total left knee replacement 09/08/2020   Spondylosis without myelopathy or radiculopathy, lumbar region 07/17/2020   Chronic pain syndrome 07/17/2020   Myalgia 07/17/2020   OSA (obstructive sleep apnea) 05/19/2020   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/17/2020   Sleep disturbance 04/17/2020   Chronic pain of both shoulders 03/03/2020   Pain in both hands 03/03/2020   Primary osteoarthritis of both knees 03/03/2020   Pain in both feet 03/03/2020   History of chronic kidney disease 03/03/2020   DDD (degenerative disc disease), lumbar 02/05/2020   Vitamin D deficiency 01/09/2020   Hyperlipidemia 01/09/2020   CKD (chronic kidney disease) stage 3, GFR 30-59 ml/min ([place]) 01/09/2020   HTN (hypertension)    Morbid obesity ([place])    Gout    Fibromyalgia    Depression    Insomnia     PCP: Isaac Bliss, [name], MD  REFERRING PROVIDER: Izora Ribas, MD  REFERRING DIAG: [phone] (  ICD-10-CM) - Chronic pain of right knee  THERAPY DIAG:  Chronic pain of right knee  Fibromyalgia  Muscle weakness-general  Other low back pain  Rationale for Evaluation and Treatment Rehabilitation  ONSET DATE: Since 2018  SUBJECTIVE:  Pt reports rib pain improved but knee "is cuttin up"  IMAGING 01/22/22  IMPRESSION: 1. Subacute healing right anterolateral ninth rib fracture. 2. No acute intrathoracic process.  SUBJECTIVE STATEMENT: Left TKR x ~3 years ago with good results, Dec 2022 back surgery with complication of infection, on prophylactic dose of antibiotics x 1 year as per infectious disease will end in 4/24 then is planning on R TKA.  Pt reports residual  weakness from back infection ue and core.Peripheral neuropathies right foot (numbness, pain)residual from lumbar fusion. Fibromyalgia x 5 years  Rib fracture when??? 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 Yes: NPRS scale: 7/10 right knee; right 9th rib area 5/10 Pain location: see subjective Pain description: ache Aggravating factors: movement or stillness Relieving factors: hydrocodone    PRECAUTIONS: Knee and Back  WEIGHT BEARING RESTRICTIONS No  FALLS:  Has patient fallen in last 6 months? 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 XR right knee: Moderately advanced tricompartmental DJD.  PATIENT SURVEYS:  FOTO 36 with goal of 38  COGNITION:  Overall cognitive status: Within functional limits for tasks assessed     SENSATION: Perphreal nueropathies right foot   POSTURE:  right valgus deformity   LOWER EXTREMITY ROM:                       Right knee: 0-110  LOWER EXTREMITY MMT:  MMT Right eval Left eval  Hip flexion 4- 4  Hip extension    Hip abduction 4 5  Hip adduction 4 5  Hip internal rotation    Hip external rotation    Knee flexion 4 4+  Knee extension    Ankle dorsiflexion    Ankle plantarflexion    Ankle inversion    Ankle eversion     (Blank rows = not tested)  UE ROM  RIGHT: Abd and flex:full with P! At end range  Flex  str: 3- Elbow  5/5  LEFT: rom wfl.  Str3+/5   FUNCTIONAL TESTS:  3.5 reps30 seconds chair stand test Timed up and go (TUG): 24.27 Berg Balance Scale: 31/56  GAIT: Distance walked: 500 Assistive device utilized: Single point cane Level of assistance: Complete Independence Comments: lateral deviation with decreased right knee flex.  Cane in [place] (needs to be left), decreased step length and cadence.    TODAY'S TREATMENT: Pt seen for aquatic therapy today.  Treatment took place in water 3.25-4.75 ft in depth at the [place]. Temp of water was 92.  Pt entered/exited the pool independently via stairs using alternating pattern with bilat hand rail   *Straddling noodle holding yellow hand floats: cycling; add/abd and skiing  Multiple widths/reps*standing  ue supported on wall: df; pf; squats; SL df then PF. *L stretch 3 x 20s hold; 4th reps with hip hike/side bending * rainbow hand floats at side with walking forward/ backwards multiple widths *SLS ue support 1 foam hand buoyR/L. Pt able to ho,d >20 ea although with unsteadiness on R *UE standing using 1  foam hand buoys: horizontal add/abd; add/abd; flex/ext (0-90) *thoracic rotation R/L *Side lunges with ue add/abd using 1 foam hand buoys x 2 widths  Pt requires the buoyancy and hydrostatic pressure of water for support, and to offload joints by unweighting joint load by at least 50 % in navel deep water and by at least 75-80% in chest to neck deep water.  Viscosity of the water is needed for resistance of strengthening. Water current perturbations provides challenge to standing balance requiring increased core activation.  * after dried off: applied I strip of reg [place] tape in zigzag pattern over incision of lumbar spine to desensitize and assist with scar mobilization.  Istripe of sensitive skin tape to lower rib lateral to anterior ribcage   PATIENT EDUCATION:  Education details: Discussed eval findings, rehab rationale and POC  and patient is in agreement Person educated: Patient Education method: Explanation Education comprehension: verbalized understanding   HOME EXERCISE PROGRAM: From previous episode  ASSESSMENT:  CLINICAL IMPRESSION: Pt with decreased 9th rib pain. She is able to tolerate session with significant improvement today. Progressed proprioceptive input with SLS through rle. Completed progressive knee strengthening exercises well. Reduction in knee and rib pain end of session to 0/10 Goals ongoing    OBJECTIVE IMPAIRMENTS Abnormal gait, decreased activity tolerance, decreased balance, decreased mobility, difficulty walking, decreased strength, and pain.   ACTIVITY LIMITATIONS lifting, bending, squatting, stairs, transfers, bed mobility, and locomotion level  PARTICIPATION LIMITATIONS: cleaning, shopping, community activity, and yard work  [place], Past/current experiences, and 1-2 comorbidities: Lumbar dysfunction, OA,   are also affecting patient's functional outcome.   REHAB POTENTIAL: Good  CLINICAL DECISION MAKING: Evolving/moderate complexity  EVALUATION COMPLEXITY: Moderate   GOALS: Goals reviewed with patient? Yes  SHORT TERM GOALS: Target date: 01/21/22 Pt will initiate access to pool/gym in order to be compliant with final HEP land and aquatic based Baseline:no access Goal status: Ongoing  2.  Pt to be ale to tolerate amb up towards 15 mins to demonstrate improved toleration to activity Baseline: 7 Goal status: Ongoing  3.  Pt will improve on Berg balance test to 40/46 Baseline: 31/56 Goal status: INITIAL  4.  Pt will improve on Right hip and knee flex to 1/2  grade without pain with resistance Baseline: 4- to 4/5 Goal status: ongoing  LONG TERM GOALS: Target date:02/18/22  Pt to meet Foto goal of 49% Baseline: 36 Goal status: INITIAL  2.  Pt to be indep with final HEP Baseline:  Goal status: INITIAL  3.  Pt will improve on 30s STS test to  7 or> to demonstrate improved le strength Baseline: 3.5 Goal status: INITIAL  4.  Pt will improve in bilat shoulder str up to or > 1 full grade Baseline: 3- to 3+/5 Goal status: INITIAL     PLAN: PT FREQUENCY: 1-2x/week  PT DURATION: 8 weeks  PLANNED INTERVENTIONS: Therapeutic exercises, Therapeutic activity, Neuromuscular re-education, Balance training, Gait training, Patient/Family education, Self Care, Joint mobilization, Joint manipulation, Stair training, Aquatic Therapy, Dry Needling, Cryotherapy, Moist heat, Taping, Ultrasound, Manual therapy, and Re-evaluation  PLAN FOR NEXT SESSION: continue RLE strengthening, core and bilat shoulder strengthening,  balance retraining. Assess response to ktape application; if tolerated - retape with pt in more upright position (vs leaning forward)   Stanton Kidney Tharon Aquas) [place] MPT 01/28/22 5:03 PM [facility] [address] [place], [place], [address] Phone: [phone]   Fax:  [phone]

## 2022-02-01 NOTE — Progress Notes (Signed)
Established Patient Office Visit     CC/Reason for Visit: Requesting labs  HPI: Amy Hooper is a 71 y.o. female who is coming in today for the above mentioned reasons. Past Medical History is significant for: Gout, hypertension, fibromyalgia, chronic kidney disease stage IIIb followed by nephrology, obstructive sleep apnea, vitamin D and B12 deficiencies.  After her back surgery she had an infection that required long-term antibiotics that she is still on and a wound VAC.  She is being followed closely by her surgeon and by infectious diseases.  On November 3 she reached over to the side and heard a pop.  She had immediate right-sided chest pain.  She went to urgent care where she was found to have ninth rib fracture.  She is requesting labs to check her cholesterol and vitamin D.   Past Medical/Surgical History: Past Medical History:  Diagnosis Date   Chronic kidney disease    Depression    Fibromyalgia    Gout    HTN (hypertension)    Insomnia    Morbid obesity ([place])    Osteoarthritis    Palpitation    Sleep apnea     Past Surgical History:  Procedure Laterality Date   APPLICATION OF WOUND VAC N/A 03/25/2021   Procedure: APPLICATION OF WOUND VAC;  Surgeon: [name], MD;  Location: [place];  Service: Orthopedics;  Laterality: N/A;   APPLICATION OF WOUND VAC N/A 03/30/2021   Procedure: WOUND VAC CHANGE 12x6x5;  Surgeon: [name], MD;  Location: [place];  Service: Orthopedics;  Laterality: N/A;   INCISION AND DRAINAGE OF WOUND N/A 03/25/2021   Procedure: LUMBAR POST OP INCISION IRRIGATION;  Surgeon: [name], MD;  Location: [place];  Service: Orthopedics;  Laterality: N/A;   JOINT REPLACEMENT     KNEE ARTHROSCOPY     LUMBAR WOUND DEBRIDEMENT N/A 03/30/2021   Procedure: REPEAT LUMBAR WOUND DEBRIDEMENT;  Surgeon: [name], MD;  Location: [place];  Service: Orthopedics;  Laterality: N/A;   right rotator cuff     ROTATOR CUFF REPAIR Left    TOTAL KNEE  ARTHROPLASTY Left 09/08/2020   Procedure: LEFT TOTAL KNEE ARTHROPLASTY;  Surgeon: Leandrew Koyanagi, MD;  Location: [place];  Service: Orthopedics;  Laterality: Left;   TUBAL LIGATION      Social History:  reports that she has never smoked. She has been exposed to tobacco smoke. She has never used smokeless tobacco. She reports that she does not drink alcohol and does not use drugs.  Allergies: Allergies  Allergen Reactions   Cefuroxime Axetil Rash and Hives   Clarithromycin Rash and Hives    Other reaction(s): Unknown   Nsaids Nausea And Vomiting    Other reaction(s): Unknown   Cefuroxime     Other reaction(s): Unknown   Robaxin [Methocarbamol] Nausea And Vomiting    "It tears my stomach up."   Toradol [Ketorolac Tromethamine] Nausea And Vomiting   Tramadol Nausea And Vomiting and Other (See Comments)    Upset stomach Other reaction(s): Unknown    Family History:  Family History  Problem Relation Age of Onset   Hypothyroidism Mother    Hyperlipidemia Other    Kidney failure Father    Dementia Father    Gout Father    Arthritis Father    Prostate cancer Father      Current Outpatient Medications:    allopurinol (ZYLOPRIM) 100 MG tablet, Take 2 tablets (200 mg total) by mouth daily.,  Disp: 180 tablet, Rfl: 1   amLODipine (NORVASC) 5 MG tablet, TAKE 1 TABLET (5 MG TOTAL) BY MOUTH DAILY., Disp: 90 tablet, Rfl: 0   amoxicillin-clavulanate (AUGMENTIN) 500-125 MG tablet, Take 1 tablet (500 mg total) by mouth in the morning and at bedtime., Disp: 60 tablet, Rfl: 5   atorvastatin (LIPITOR) 10 MG tablet, TAKE 1 TABLET BY MOUTH EVERYDAY AT BEDTIME, Disp: 90 tablet, Rfl: 1   DULoxetine (CYMBALTA) 20 MG capsule, TAKE 1 CAPSULE BY MOUTH DAILY, Disp: 30 capsule, Rfl: 11   eszopiclone (LUNESTA) 2 MG TABS tablet, TAKE 1 TABLET BY MOUTH AT NIGHT IMMEDIATELY BEFORE BEDTIME, Disp: 30 tablet, Rfl: 1   fluticasone (FLONASE) 50 MCG/ACT nasal spray, Place 1 spray into both nostrils daily as needed  for allergies., Disp: , Rfl:    HYDROcodone-acetaminophen (NORCO) 7.5-325 MG tablet, Take 1 tablet by mouth 2 (two) times daily as needed for moderate pain., Disp: 60 tablet, Rfl: 0   hydrocortisone (ANUSOL-HC) 2.5 % rectal cream, Place 1 Application rectally 2 (two) times daily., Disp: 30 g, Rfl: 3   levofloxacin (LEVAQUIN) 500 MG tablet, TAKE 1 TABLET BY MOUTH DAILY, Disp: 30 tablet, Rfl: 6   loratadine (CLARITIN) 10 MG tablet, Take 10 mg by mouth daily as needed for allergies., Disp: , Rfl:    losartan (COZAAR) 50 MG tablet, TAKE 1 TABLET BY MOUTH EVERY DAY, Disp: 90 tablet, Rfl: 0   predniSONE (DELTASONE) 5 MG tablet, Take 4 tabs po x 2 days, 3  tabs po x 2 days, 2 tabs po x 2 days, 1  tab po x 2 days, Disp: 20 tablet, Rfl: 0   pregabalin (LYRICA) 75 MG capsule, Take 1 capsule (75 mg total) by mouth 3 (three) times daily., Disp: 90 capsule, Rfl: 3   Vitamin D, Ergocalciferol, (DRISDOL) 1.25 MG (50000 UNIT) CAPS capsule, TAKE 1 CAPSULE (50,000 UNITS TOTAL) BY MOUTH EVERY 7 (SEVEN) DAYS, Disp: 4 capsule, Rfl: 1  Review of Systems:  Constitutional: Denies fever, chills, diaphoresis, appetite change and fatigue.  HEENT: Denies photophobia, eye pain, redness, hearing loss, ear pain, congestion, sore throat, rhinorrhea, sneezing, mouth sores, trouble swallowing, neck pain, neck stiffness and tinnitus.   Respiratory: Denies SOB, DOE, cough, chest tightness,  and wheezing.   Cardiovascular: Denies chest pain, palpitations and leg swelling.  Gastrointestinal: Denies nausea, vomiting, abdominal pain, diarrhea, constipation, blood in stool and abdominal distention.  Genitourinary: Denies dysuria, urgency, frequency, hematuria, flank pain and difficulty urinating.  Endocrine: Denies: hot or cold intolerance, sweats, changes in hair or nails, polyuria, polydipsia. Skin: Denies pallor, rash and wound.  Neurological: Denies dizziness, seizures, syncope, weakness, light-headedness, numbness and headaches.   Hematological: Denies adenopathy. Easy bruising, personal or family bleeding history  Psychiatric/Behavioral: Denies suicidal ideation, mood changes, confusion, nervousness, sleep disturbance and agitation    Physical Exam: Vitals:   02/01/22 0730 02/01/22 0757  BP: 130/80 128/79  Pulse: (!) 55   Temp: 97.6 F (36.4 C)   TempSrc: Oral   SpO2: 98%   Weight: 202 lb 3.2 oz (91.7 kg)     Body mass index is 36.98 kg/m.   Constitutional: NAD, calm, comfortable, ambulating with a cane Eyes: PERRL, lids and conjunctivae normal ENMT: Mucous membranes are moist.  Respiratory: clear to auscultation bilaterally, no wheezing, no crackles. Normal respiratory effort. No accessory muscle use.  Cardiovascular: Regular rate and rhythm, no murmurs / rubs / gallops. No extremity edema.  Psychiatric: Normal judgment and insight. Alert and oriented x 3. Normal mood.  Impression and Plan:  Primary hypertension  Vitamin D deficiency - Plan: VITAMIN D 25 Hydroxy (Vit-D Deficiency, Fractures)  Mixed hyperlipidemia - Plan: Lipid panel  Closed fracture of one rib of right side with routine healing, subsequent encounter - Plan: DG Bone Density   -Blood pressure is fairly well controlled. -Check vitamin D and lipid panels today. -Because of her recent rib fracture with minimal trauma, update bone density scan. -She will schedule follow-up for CPE as she is overdue.    Lelon Frohlich, MD  Primary Care at [facility]

## 2022-02-02 ENCOUNTER — Ambulatory Visit (HOSPITAL_BASED_OUTPATIENT_CLINIC_OR_DEPARTMENT_OTHER): Payer: Commercial Managed Care - PPO | Admitting: Physical Therapy

## 2022-02-02 ENCOUNTER — Other Ambulatory Visit: Payer: Self-pay | Admitting: *Deleted

## 2022-02-02 ENCOUNTER — Encounter (HOSPITAL_BASED_OUTPATIENT_CLINIC_OR_DEPARTMENT_OTHER): Payer: Self-pay | Admitting: Physical Therapy

## 2022-02-02 DIAGNOSIS — M25561 Pain in right knee: Secondary | ICD-10-CM | POA: Diagnosis not present

## 2022-02-02 DIAGNOSIS — E782 Mixed hyperlipidemia: Secondary | ICD-10-CM

## 2022-02-02 DIAGNOSIS — M6281 Muscle weakness (generalized): Secondary | ICD-10-CM

## 2022-02-02 DIAGNOSIS — M797 Fibromyalgia: Secondary | ICD-10-CM

## 2022-02-02 DIAGNOSIS — M5459 Other low back pain: Secondary | ICD-10-CM

## 2022-02-02 DIAGNOSIS — G8929 Other chronic pain: Secondary | ICD-10-CM

## 2022-02-03 ENCOUNTER — Other Ambulatory Visit: Payer: Self-pay | Admitting: Internal Medicine

## 2022-02-03 DIAGNOSIS — K649 Unspecified hemorrhoids: Secondary | ICD-10-CM

## 2022-02-04 ENCOUNTER — Encounter: Payer: Self-pay | Admitting: Internal Medicine

## 2022-02-04 ENCOUNTER — Encounter (HOSPITAL_BASED_OUTPATIENT_CLINIC_OR_DEPARTMENT_OTHER): Payer: Self-pay | Admitting: Physical Therapy

## 2022-02-04 ENCOUNTER — Ambulatory Visit (HOSPITAL_BASED_OUTPATIENT_CLINIC_OR_DEPARTMENT_OTHER): Payer: Commercial Managed Care - PPO | Admitting: Physical Therapy

## 2022-02-04 DIAGNOSIS — M5459 Other low back pain: Secondary | ICD-10-CM

## 2022-02-04 DIAGNOSIS — G8929 Other chronic pain: Secondary | ICD-10-CM

## 2022-02-04 DIAGNOSIS — Z13820 Encounter for screening for osteoporosis: Secondary | ICD-10-CM

## 2022-02-04 DIAGNOSIS — E559 Vitamin D deficiency, unspecified: Secondary | ICD-10-CM

## 2022-02-04 DIAGNOSIS — M25561 Pain in right knee: Secondary | ICD-10-CM | POA: Diagnosis not present

## 2022-02-04 DIAGNOSIS — M797 Fibromyalgia: Secondary | ICD-10-CM

## 2022-02-04 DIAGNOSIS — M6281 Muscle weakness (generalized): Secondary | ICD-10-CM

## 2022-02-04 NOTE — Therapy (Signed)
OUTPATIENT PHYSICAL THERAPY LOWER EXTREMITY TREATMENT   Patient Name: Arbor Leer MRN: [id number] DOB:[birth year], 71 y.o., female Today's Date: 02/05/2022   PT End of Session - 02/05/22 1706     Visit Number 10    Number of Visits 16    Date for PT Re-Evaluation 02/18/22    Authorization Type [employer]    PT Start Time 8329    PT Stop Time 1648    PT Time Calculation (min) 42 min    Activity Tolerance Patient tolerated treatment well    Behavior During Therapy WFL for tasks assessed/performed                Past Medical History:  Diagnosis Date   Chronic kidney disease    Depression    Fibromyalgia    Gout    HTN (hypertension)    Insomnia    Morbid obesity ([place])    Osteoarthritis    Palpitation    Sleep apnea    Past Surgical History:  Procedure Laterality Date   APPLICATION OF WOUND VAC N/A 03/25/2021   Procedure: APPLICATION OF WOUND VAC;  Surgeon: [name], MD;  Location: [place];  Service: Orthopedics;  Laterality: N/A;   APPLICATION OF WOUND VAC N/A 03/30/2021   Procedure: WOUND VAC CHANGE 12x6x5;  Surgeon: [name], MD;  Location: [place];  Service: Orthopedics;  Laterality: N/A;   INCISION AND DRAINAGE OF WOUND N/A 03/25/2021   Procedure: LUMBAR POST OP INCISION IRRIGATION;  Surgeon: [name], MD;  Location: [place];  Service: Orthopedics;  Laterality: N/A;   JOINT REPLACEMENT     KNEE ARTHROSCOPY     LUMBAR WOUND DEBRIDEMENT N/A 03/30/2021   Procedure: REPEAT LUMBAR WOUND DEBRIDEMENT;  Surgeon: [name], MD;  Location: [place];  Service: Orthopedics;  Laterality: N/A;   right rotator cuff     ROTATOR CUFF REPAIR Left    TOTAL KNEE ARTHROPLASTY Left 09/08/2020   Procedure: LEFT TOTAL KNEE ARTHROPLASTY;  Surgeon: Leandrew Koyanagi, MD;  Location: [place];  Service: Orthopedics;  Laterality: Left;   TUBAL LIGATION     Patient Active Problem List   Diagnosis Date Noted   Acute on chronic renal insufficiency 03/27/2021   Postoperative complication  of skin involving drainage from surgical wound 03/25/2021   Post op infection 03/24/2021   S/P lumbar fusion 03/24/2021   Lumbar stenosis 03/09/2021   Degenerative spondylolisthesis 02/19/2021   Primary osteoarthritis of left knee 09/08/2020   DJD (degenerative joint disease) of knee 09/08/2020   Status post total left knee replacement 09/08/2020   Spondylosis without myelopathy or radiculopathy, lumbar region 07/17/2020   Chronic pain syndrome 07/17/2020   Myalgia 07/17/2020   OSA (obstructive sleep apnea) 05/19/2020   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/17/2020   Sleep disturbance 04/17/2020   Chronic pain of both shoulders 03/03/2020   Pain in both hands 03/03/2020   Primary osteoarthritis of both knees 03/03/2020   Pain in both feet 03/03/2020   History of chronic kidney disease 03/03/2020   DDD (degenerative disc disease), lumbar 02/05/2020   Vitamin D deficiency 01/09/2020   Hyperlipidemia 01/09/2020   CKD (chronic kidney disease) stage 3, GFR 30-59 ml/min ([place]) 01/09/2020   HTN (hypertension)    Morbid obesity ([place])    Gout    Fibromyalgia    Depression    Insomnia     PCP: Isaac Bliss, [name], MD  REFERRING PROVIDER: Izora Ribas, MD  REFERRING DIAG: [phone] (  ICD-10-CM) - Chronic pain of right knee  THERAPY DIAG:  Chronic pain of right knee  Fibromyalgia  Muscle weakness-general  Other low back pain  Rationale for Evaluation and Treatment Rehabilitation  ONSET DATE: Since 2018   IMAGING 01/22/22  IMPRESSION: 1. Subacute healing right anterolateral ninth rib fracture. 2. No acute intrathoracic process.  SUBJECTIVE STATEMENT: Left TKR x ~3 years ago with good results, Dec 2022 back surgery (Lumbar fusion) with complication of infection, on prophylactic dose of antibiotics x 1 year as per infectious disease will end in 4/24 then is planning on R TKA.  Pt reports residual weakness from back infection ue and core.Peripheral  neuropathies right foot (numbness, pain)residual from lumbar fusion. Fibromyalgia x 5 years  Pt reports her rib fracture occurred over 2 months ago, but didn't realize she had a fracture.  She had increased pain on 11/3 and went to urgent care.  She was informed she had a healing rib fracture.    Pt tripped over a root and fell on Sunday.  Her husband and another gentleman lifted her off the ground which irritated her rib.  Pt had pain in R hip but was able to walk.  She states she is sore all over.  Pt saw PCP on Monday and didn't have any x rays.    Pt states she was sore all over from the fall.  Pt reports having increased back pain after prior rx.  She also had R knee pain the following day.  Her R UE is sore which is the arm her husband pulled her up.  Pt took hydrocodone at 6 AM this morning which she usually doesn't have to.  Pt states her rib feels better with taping and focused breathing.  Pt typically works for 6 hrs on the computer though was only able to perform 3 hours today and had to lie down.    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 Yes: PRS scale: 7/10 right knee; 0/10 right 9th rib area; 0/10 R hip  ; soreness in lumbar, but no pain Pain location: see above Pain description: ache Aggravating factors: movement or stillness Relieving factors: hydrocodone    PRECAUTIONS: Knee and Back  WEIGHT BEARING RESTRICTIONS No  FALLS:  Has patient fallen in last 6 months? 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 XR right knee: Moderately advanced tricompartmental DJD.   TODAY'S TREATMENT: Reviewed pt presentation, response to prior Rx, and pain levels.  Pt seen for aquatic therapy today.  Treatment took place in water 3.25-4.75 ft in depth at the [facility]. Temp of water was 92.  Pt entered/exited the pool independently via stairs using alternating pattern with bilat hand rail  Pt ambulated fwd and bwd and sidestepped at 3 ft 6 inch to 4 ft depth without UE support Squats 2 x 10 reps with UE support on pool wall Heel raises and toe raises 2x10 with UE support on pool wall Marching with UE support on yellow noodle 2x10 SLS ue support 1 foam hand buoy  R/L. 3x20 sec bilat Straddling noodle holding yellow hand floats: cycling; add/abd and skiing  Multiple widths/reps rainbow hand floats at side with walking forward/ backwards multiple widths   Pt requires the buoyancy and hydrostatic pressure of water for support, and to offload joints by unweighting joint load by at least 50 % in navel deep water and by at least 75-80% in chest to neck deep water.  Viscosity of the water is needed for resistance of strengthening. Water current perturbations provides challenge to standing balance requiring increased core activation.  PATIENT EDUCATION:  Education details: Exercise form, exercise rationale, and POC Person educated: Patient Education method: Explanation, demonstration, verbal cues Education comprehension: verbalized understanding, returned demonstration, verbal cues required   HOME EXERCISE PROGRAM: From previous episode  ASSESSMENT:  CLINICAL IMPRESSION: Pt presents to Rx having no hip, lumbar, or rib pain today but her R knee is bothering her.  Pt performed aquatic exercises well with cuing and instruction in correct form and positioning.  She denies having any knee pain in the pool.  Pt responded well to Rx reporting improved pain from 7/10 in R knee before Rx to  0/10 after Rx.  Pt should benefit form cont skilled PT services to address impairments and goals and to improve overall function.    OBJECTIVE IMPAIRMENTS Abnormal gait, decreased activity tolerance, decreased balance, decreased mobility, difficulty walking, decreased strength, and pain.   ACTIVITY LIMITATIONS lifting, bending, squatting, stairs, transfers, bed mobility, and locomotion level  PARTICIPATION LIMITATIONS: cleaning, shopping, community activity, and yard work  [place], Past/current experiences, and 1-2 comorbidities: Lumbar dysfunction, OA,   are also affecting patient's functional outcome.   REHAB POTENTIAL: Good  CLINICAL DECISION MAKING: Evolving/moderate complexity  EVALUATION COMPLEXITY: Moderate   GOALS: Goals reviewed with patient? Yes  SHORT TERM GOALS: Target date: 01/21/22 Pt will initiate access to pool/gym in order to be compliant with final HEP land and aquatic based Baseline:no access Goal status: Ongoing  2.  Pt to be ale to tolerate amb up towards 15 mins to demonstrate improved toleration to activity Baseline: 7 Goal status: Ongoing  3.  Pt will improve on Berg balance test to 40/46 Baseline: 31/56 Goal status: INITIAL  4.  Pt will improve on Right hip and knee flex to 1/2  grade without pain with resistance Baseline: 4- to 4/5 Goal status: ongoing  LONG TERM GOALS: Target date:02/18/22  Pt to meet [place] goal of 49% Baseline: 36 Goal status: INITIAL  2.  Pt to be indep with final HEP Baseline:  Goal status: INITIAL  3.  Pt will improve on 30s STS test to 7 or> to demonstrate improved le strength Baseline: 3.5 Goal status: INITIAL  4.  Pt will improve in bilat shoulder str up to or > 1 full grade Baseline: 3- to 3+/5 Goal status: INITIAL     PLAN: PT FREQUENCY: 1-2x/week  PT DURATION: 8 weeks  PLANNED INTERVENTIONS: Therapeutic exercises, Therapeutic activity, Neuromuscular re-education, Balance training, Gait  training, Patient/Family education, Self Care, Joint mobilization, Joint manipulation, Stair training, Aquatic Therapy, Dry Needling, Cryotherapy, Moist heat, Taping, Ultrasound, Manual therapy, and Re-evaluation  PLAN FOR NEXT SESSION:   May decrease exercises depending on pt tolerance due to recent fall and increased pain.  Pt to stay in aquati therapy currently due to pain and rib fx.  continue RLE strengthening, core and bilat shoulder strengthening, balance retraining. Assess response to ktape application; if tolerated - retape with pt in more upright position (vs leaning forward).  PN next visit.    Selinda Michaels III PT, DPT 02/05/22 5:14 PM

## 2022-02-05 ENCOUNTER — Ambulatory Visit (INDEPENDENT_AMBULATORY_CARE_PROVIDER_SITE_OTHER): Payer: Commercial Managed Care - PPO | Admitting: Psychologist

## 2022-02-05 DIAGNOSIS — F320 Major depressive disorder, single episode, mild: Secondary | ICD-10-CM

## 2022-02-05 NOTE — Plan of Care (Signed)

## 2022-02-05 NOTE — Progress Notes (Signed)
                Perfecto Purdy, PsyD 
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
## 2022-02-07 ENCOUNTER — Encounter: Payer: Self-pay | Admitting: Orthopaedic Surgery

## 2022-02-07 NOTE — Therapy (Signed)
OUTPATIENT PHYSICAL THERAPY LOWER EXTREMITY TREATMENT / PROGRESS NOTE   Patient Name: Amy Hooper MRN: [id number] DOB:1950/05/09, 71 y.o., female Today's Date: 02/08/2022   PT End of Session - 02/08/22 1650     Visit Number 11    Number of Visits 22    Date for PT Re-Evaluation 03/22/22    Authorization Type U[employer]    PT Start Time 0569    PT Stop Time 1703    PT Time Calculation (min) 58 min    Activity Tolerance Patient tolerated treatment well    Behavior During Therapy WFL for tasks assessed/performed                 Past Medical History:  Diagnosis Date   Chronic kidney disease    Depression    Fibromyalgia    Gout    HTN (hypertension)    Insomnia    Morbid obesity ([place])    Osteoarthritis    Palpitation    Sleep apnea    Past Surgical History:  Procedure Laterality Date   APPLICATION OF WOUND VAC N/A 03/25/2021   Procedure: APPLICATION OF WOUND VAC;  Surgeon: [name], MD;  Location: [place];  Service: Orthopedics;  Laterality: N/A;   APPLICATION OF WOUND VAC N/A 03/30/2021   Procedure: WOUND VAC CHANGE 12x6x5;  Surgeon: [name], MD;  Location: [place];  Service: Orthopedics;  Laterality: N/A;   INCISION AND DRAINAGE OF WOUND N/A 03/25/2021   Procedure: LUMBAR POST OP INCISION IRRIGATION;  Surgeon: [name], MD;  Location: [place];  Service: Orthopedics;  Laterality: N/A;   JOINT REPLACEMENT     KNEE ARTHROSCOPY     LUMBAR WOUND DEBRIDEMENT N/A 03/30/2021   Procedure: REPEAT LUMBAR WOUND DEBRIDEMENT;  Surgeon: [name], MD;  Location: [place];  Service: Orthopedics;  Laterality: N/A;   right rotator cuff     ROTATOR CUFF REPAIR Left    TOTAL KNEE ARTHROPLASTY Left 09/08/2020   Procedure: LEFT TOTAL KNEE ARTHROPLASTY;  Surgeon: Leandrew Koyanagi, MD;  Location: [place];  Service: Orthopedics;  Laterality: Left;   TUBAL LIGATION     Patient Active Problem List   Diagnosis Date Noted   Acute on chronic renal insufficiency 03/27/2021    Postoperative complication of skin involving drainage from surgical wound 03/25/2021   Post op infection 03/24/2021   S/P lumbar fusion 03/24/2021   Lumbar stenosis 03/09/2021   Degenerative spondylolisthesis 02/19/2021   Primary osteoarthritis of left knee 09/08/2020   DJD (degenerative joint disease) of knee 09/08/2020   Status post total left knee replacement 09/08/2020   Spondylosis without myelopathy or radiculopathy, lumbar region 07/17/2020   Chronic pain syndrome 07/17/2020   Myalgia 07/17/2020   OSA (obstructive sleep apnea) 05/19/2020   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/17/2020   Sleep disturbance 04/17/2020   Chronic pain of both shoulders 03/03/2020   Pain in both hands 03/03/2020   Primary osteoarthritis of both knees 03/03/2020   Pain in both feet 03/03/2020   History of chronic kidney disease 03/03/2020   DDD (degenerative disc disease), lumbar 02/05/2020   Vitamin D deficiency 01/09/2020   Hyperlipidemia 01/09/2020   CKD (chronic kidney disease) stage 3, GFR 30-59 ml/min ([place]) 01/09/2020   HTN (hypertension)    Morbid obesity ([place])    Gout    Fibromyalgia    Depression    Insomnia     PCP: Isaac Bliss, [name], MD  REFERRING PROVIDER: Izora Ribas, MD  REFERRING DIAG: M25.561,G89.29 (ICD-10-CM) - Chronic pain of right knee  THERAPY DIAG:  Chronic pain of right knee  Muscle weakness-general  Other low back pain  Rationale for Evaluation and Treatment Rehabilitation  ONSET DATE: Since 2018   IMAGING 01/22/22  IMPRESSION: 1. Subacute healing right anterolateral ninth rib fracture. 2. No acute intrathoracic process.  SUBJECTIVE STATEMENT: Left TKR on 09/04/20 ago with good results, Dec 2022 back surgery (Lumbar fusion) with complication of infection, on prophylactic dose of antibiotics x 1 year as per infectious disease will end in 4/24 then is planning on R TKA.  Pt reports residual weakness from back infection ue and  core.Peripheral neuropathies right foot (numbness, pain)residual from lumbar fusion. Fibromyalgia x 5 years  Pt states her rib has been feeling good.  Pt denies any adverse effects after prior Rx.  Pt has been using cane since Lumbar surgery in Dec 2022.    Pt states she has had intermittent pain in L medial ankle/foot and spoke to Dr. Lorin Mercy about it awhile ago.  Pt states he recommended orthotics.  Pt reports last weekend her ankle/foot began hurting worse and she has had more swelling.  Pt is seeing Dr. Erlinda Hong on Wednesday and may receive an injection.   FUNCTIONAL IMPROVEMENTS:  Sit/stands, less pain with taking 1st step, stairs.  Pt doesn't have to use cane with cooking.  Pt states she doesn't have knee pain in the AM.  Walking to mailbox and back though does have to sit down when getting back.  FUNCTIONAL LIMITATIONS: difficulty sleeping due to knee pain at night though is better.  Pt has to frequently sit while cooking.  Pt has difficulty with squatting and bending to get objects out of the oven.  Requires assistance when walking around her property.  Standing duration limited, gardening.   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 Yes:  NPRS scale:  R Knee: 7/10 current, 10/10 worst, 0/10 best R 9th rib area: 0/10 R hip current and best, 6/10 worst  Lumbar and R hip:  8/10 current, 9/10 worst, 2/10 best Pain location: see above Pain description: ache Aggravating factors: movement or stillness Relieving factors: hydrocodone    PRECAUTIONS: Knee and Back  WEIGHT BEARING RESTRICTIONS No  FALLS:  Has patient fallen in last 6 months? 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  To be able to garden without cane   OBJECTIVE:   DIAGNOSTIC FINDINGS: XR Lumbar spine: Impression: Status post single level L4-5 instrumented fusion satisfactory  radiographs. XR right knee: Moderately advanced tricompartmental DJD.   TODAY'S TREATMENT: Physical Performance Testing: Reviewed current function and pain levels.  See below for pt education.   PATIENT SURVEYS:  FOTO Initial / Current:  36 / 41 with goal of 49    LOWER EXTREMITY ROM:                       Right knee: 0-118   LOWER EXTREMITY MMT:   MMT Right eval Left eval Right 11/20 Left 11/20  Hip flexion 4- 4 4-/5 4/5  Hip extension        Hip abduction 4 5  4+/5 ; 21.4 4/5; 21.3  Hip adduction 4 5    Hip internal rotation        Hip external rotation        Knee flexion 4 4+ 4/5 seated 5/5 seated  Knee extension     5/5 5/5  Ankle dorsiflexion        Ankle plantarflexion        Ankle inversion        Ankle eversion         (Blank rows = not tested)     FUNCTIONAL TESTS:  3.5 reps / 11 reps initial/current: 30 seconds chair stand test Timed up and go (TUG):  Initial/Current: 24.27 / 11 sec without an AD.     GAIT: Assistive device utilized: Single point cane Level of assistance: Complete Independence Comments:  decreased step length and cadence.    Aquatic therapy: Pt seen for aquatic therapy today.  Treatment took place in water 3.25-4.75 ft in depth at the [facility]. Temp of water was 92.  Pt entered/exited the pool independently via stairs using alternating pattern with bilat hand rail  Pt ambulated fwd and bwd and sidestepped at 3 ft 6 inch to 4 ft depth without UE support Squats 2 x 10 reps with UE support on pool wall Marching with UE support on yellow noodle 2x10 SLS ue support 1 foam hand buoy R/L. 3x20 sec on R  Straddling noodle holding yellow hand floats: cycling; add/abd and skiing  Multiple widths/reps   Pt requires the buoyancy and hydrostatic  pressure of water for support, and to offload joints by unweighting joint load by at least 50 % in navel deep water and by at least 75-80% in chest to neck deep water.  Viscosity of the water is needed for resistance of strengthening. Water current perturbations provides challenge to standing balance requiring increased core activation.  PATIENT EDUCATION:  Education details: objective findings, progress, exercise form, exercise rationale, and POC Person educated: Patient Education method: Explanation, demonstration, verbal cues Education comprehension: verbalized understanding, returned demonstration, verbal cues required   HOME EXERCISE PROGRAM: From previous episode  ASSESSMENT:  CLINICAL IMPRESSION: Pt is progressing well with aquatic therapy.  She reports improved pain with daily activities and mobility including walking and sit/stand transfers.  Pt is able to ambulate in kitchen and home more without SPC though is limited with standing duration having to sit down multiple times while cooking.  She requires assistance when walking around her property including an AD and her husband.  Pt reports her rib is feeling much better though she has had some pain and swelling in L ankle as of last weekend.  Pt seeing MD on Wednesday.  Pt has demonstrates some improvement in LE strength though continues to have deficits.  R knee flexion AROM has improved form 110 deg initially to 118 deg currently.  Pt demonstrates great improvement in functional testing including 30s STS score improving from 3.5 reps to 11 reps and TUG score improving from 24 sec initially to 11 sec currently.  Though pt has made good progress, she has made limited progress toward her goals.  Pt met LTG #3 and is progressing toward other goals.  Pt feels better in the pool and responded well to Rx reporting improved pain from 7/10 in R knee before Rx to 0/10 after Rx.  Pt should benefit from cont skilled PT services to address impairments  and goals and to improve overall function.    OBJECTIVE  IMPAIRMENTS Abnormal gait, decreased activity tolerance, decreased balance, decreased mobility, difficulty walking, decreased strength, and pain.   ACTIVITY LIMITATIONS lifting, bending, squatting, stairs, transfers, bed mobility, and locomotion level  PARTICIPATION LIMITATIONS: cleaning, shopping, community activity, and yard work  [place], Past/current experiences, and 1-2 comorbidities: Lumbar dysfunction, OA,   are also affecting patient's functional outcome.   REHAB POTENTIAL: Good  CLINICAL DECISION MAKING: Evolving/moderate complexity  EVALUATION COMPLEXITY: Moderate   GOALS: Goals reviewed with patient? Yes  SHORT TERM GOALS: Target date: 01/21/22 Pt will initiate access to pool/gym in order to be compliant with final HEP land and aquatic based Baseline:no access Goal status: Ongoing  2.  Pt to be ale to tolerate amb up towards 15 mins to demonstrate improved toleration to activity Baseline: 7 Goal status: Ongoing  3.  Pt will improve on Berg balance test to 40/46 Baseline: 31/56 Goal status: Not assessed  4.  Pt will improve on Right hip and knee flex to 1/2  grade without pain with resistance Baseline: 4- to 4/5 Goal status: ongoing  LONG TERM GOALS: Target date:03/22/2022  Pt to meet Foto goal of 49% Baseline: 36; currently 41 Goal status: Progressing  2.  Pt to be indep with final HEP Baseline:  Goal status: ongoing  3.  Pt will improve on 30s STS test to 7 or> to demonstrate improved le strength Baseline: 3.5 Goal status: Goal met  4.  Pt will improve in bilat shoulder str up to or > 1 full grade Baseline: 3- to 3+/5 Goal status: not tested due to R rib fracture     PLAN: PT FREQUENCY: 1-2x/week  PT DURATION: 6 weeks  PLANNED INTERVENTIONS: Therapeutic exercises, Therapeutic activity, Neuromuscular re-education, Balance training, Gait training, Patient/Family education,  Self Care, Joint mobilization, Joint manipulation, Stair training, Aquatic Therapy, Dry Needling, Cryotherapy, Moist heat, Taping, Ultrasound, Manual therapy, and Re-evaluation  PLAN FOR NEXT SESSION:   May decrease exercises depending on pt tolerance due to recent fall and increased pain.  Pt to stay in aquatic therapy currently due to pain and rib fx.  continue RLE strengthening, core and bilat shoulder strengthening, balance retraining. Assess response to ktape application; if tolerated - retape with pt in more upright position (vs leaning forward).      Selinda Michaels III PT, DPT 02/08/22 5:23 PM

## 2022-02-08 ENCOUNTER — Encounter (HOSPITAL_BASED_OUTPATIENT_CLINIC_OR_DEPARTMENT_OTHER): Payer: Self-pay | Admitting: Physical Therapy

## 2022-02-08 ENCOUNTER — Ambulatory Visit (HOSPITAL_BASED_OUTPATIENT_CLINIC_OR_DEPARTMENT_OTHER): Payer: Commercial Managed Care - PPO | Admitting: Physical Therapy

## 2022-02-08 DIAGNOSIS — M6281 Muscle weakness (generalized): Secondary | ICD-10-CM

## 2022-02-08 DIAGNOSIS — M5459 Other low back pain: Secondary | ICD-10-CM

## 2022-02-08 DIAGNOSIS — M25561 Pain in right knee: Secondary | ICD-10-CM | POA: Diagnosis not present

## 2022-02-08 DIAGNOSIS — G8929 Other chronic pain: Secondary | ICD-10-CM

## 2022-02-09 ENCOUNTER — Encounter: Payer: Commercial Managed Care - PPO | Attending: Registered Nurse | Admitting: Registered Nurse

## 2022-02-09 VITALS — BP 152/84 | HR 69 | Ht 62.0 in | Wt 199.0 lb

## 2022-02-09 DIAGNOSIS — M25561 Pain in right knee: Secondary | ICD-10-CM | POA: Diagnosis not present

## 2022-02-09 DIAGNOSIS — F32A Depression, unspecified: Secondary | ICD-10-CM | POA: Insufficient documentation

## 2022-02-09 DIAGNOSIS — T8140XA Infection following a procedure, unspecified, initial encounter: Secondary | ICD-10-CM | POA: Insufficient documentation

## 2022-02-09 DIAGNOSIS — M797 Fibromyalgia: Secondary | ICD-10-CM | POA: Diagnosis not present

## 2022-02-09 DIAGNOSIS — M545 Low back pain, unspecified: Secondary | ICD-10-CM | POA: Diagnosis not present

## 2022-02-09 DIAGNOSIS — Z5181 Encounter for therapeutic drug level monitoring: Secondary | ICD-10-CM | POA: Diagnosis not present

## 2022-02-09 DIAGNOSIS — Z760 Encounter for issue of repeat prescription: Secondary | ICD-10-CM | POA: Diagnosis not present

## 2022-02-09 DIAGNOSIS — Z79899 Other long term (current) drug therapy: Secondary | ICD-10-CM | POA: Insufficient documentation

## 2022-02-09 DIAGNOSIS — G894 Chronic pain syndrome: Secondary | ICD-10-CM | POA: Diagnosis present

## 2022-02-09 DIAGNOSIS — Z79891 Long term (current) use of opiate analgesic: Secondary | ICD-10-CM | POA: Diagnosis not present

## 2022-02-09 DIAGNOSIS — M170 Bilateral primary osteoarthritis of knee: Secondary | ICD-10-CM | POA: Diagnosis not present

## 2022-02-09 DIAGNOSIS — W14XXXD Fall from tree, subsequent encounter: Secondary | ICD-10-CM

## 2022-02-09 DIAGNOSIS — Z981 Arthrodesis status: Secondary | ICD-10-CM | POA: Diagnosis not present

## 2022-02-09 DIAGNOSIS — M2550 Pain in unspecified joint: Secondary | ICD-10-CM

## 2022-02-09 DIAGNOSIS — G8929 Other chronic pain: Secondary | ICD-10-CM

## 2022-02-09 MED ORDER — DULOXETINE HCL 20 MG PO CPEP: ORAL_CAPSULE | ORAL | 2 refills | Status: DC

## 2022-02-09 MED ORDER — HYDROCODONE-ACETAMINOPHEN 7.5-325 MG PO TABS: 1.0000 | ORAL_TABLET | Freq: Two times a day (BID) | ORAL | 0 refills | Status: DC | PRN

## 2022-02-09 NOTE — Progress Notes (Unsigned)
Subjective:    Patient ID: Amy Hooper, female    DOB: 11-04-1950, 71 y.o.   MRN: [id number]  HPI ; Amy Hooper is a 71 y.o. female who returns for follow up appointment for chronic pain and medication refill. states *** pain is located in  ***. rates pain ***. current exercise regime is walking and performing stretching exercises.  Amy Hooper Morphine equivalent is 15.00 MME.   UDS ordered today     Pain Inventory Average Pain 8 Pain Right Now 9 My pain is burning, stabbing, tingling, and aching  In the last 24 hours, has pain interfered with the following? General activity 8 Relation with others 8 Enjoyment of life 8 What TIME of day is your pain at its worst? morning  and night Sleep (in general) Fair  Pain is worse with: walking, bending, sitting, standing, and some activites Pain improves with: rest, heat/ice, therapy/exercise, medication, and injections Relief from Meds: 9  Family History  Problem Relation Age of Onset   Hypothyroidism Mother    Hyperlipidemia Other    Kidney failure Father    Dementia Father    Gout Father    Arthritis Father    Prostate cancer Father    Social History   Socioeconomic History   Marital status: Married    Spouse name: Not on file   Number of children: 3   Years of education: Not on file   Highest education level: Bachelor's degree (e.g., BA, AB, BS)  Occupational History   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     Social Determinants of Health   Financial Resource Strain: Low Risk  (03/01/2021)   Overall Financial Resource Strain (CARDIA)    Difficulty of  Paying Living Expenses: Not hard at all  Food Insecurity: No Food Insecurity (03/01/2021)   Hunger Vital Sign    Worried About Running Out of Food in the Last Year: Never true    Ran Out of Food in the Last Year: Never true  Transportation Needs: No Transportation Needs (03/01/2021)   PRAPARE - Hydrologist (Medical): No    Lack of Transportation (Non-Medical): No  Physical Activity: Unknown (03/01/2021)   Exercise Vital Sign    Days of Exercise per Week: 0 days    Minutes of Exercise per Session: Not on file  Stress: No Stress Concern Present (03/01/2021)   [place]    Feeling of Stress : Only a little  Social Connections: Moderately Isolated (03/01/2021)   Social Connection and Isolation Panel [NHANES]    Frequency of Communication with Friends and Family: More than three times a week    Frequency of Social Gatherings with Friends and Family: Once a week    Attends Religious Services: Never    Marine scientist or Organizations: No    Attends Music therapist: Not on file    Marital Status: Married   Past Surgical History:  Procedure Laterality Date   APPLICATION OF WOUND VAC N/A 03/25/2021  Procedure: APPLICATION OF WOUND VAC;  Surgeon: [name], MD;  Location: [place];  Service: Orthopedics;  Laterality: N/A;   APPLICATION OF WOUND VAC N/A 03/30/2021   Procedure: WOUND VAC CHANGE 12x6x5;  Surgeon: [name], MD;  Location: [place];  Service: Orthopedics;  Laterality: N/A;   INCISION AND DRAINAGE OF WOUND N/A 03/25/2021   Procedure: LUMBAR POST OP INCISION IRRIGATION;  Surgeon: [name], MD;  Location: [place];  Service: Orthopedics;  Laterality: N/A;   JOINT REPLACEMENT     KNEE ARTHROSCOPY     LUMBAR WOUND DEBRIDEMENT N/A 03/30/2021   Procedure: REPEAT LUMBAR WOUND DEBRIDEMENT;  Surgeon: [name], MD;  Location: [place];  Service: Orthopedics;  Laterality:  N/A;   right rotator cuff     ROTATOR CUFF REPAIR Left    TOTAL KNEE ARTHROPLASTY Left 09/08/2020   Procedure: LEFT TOTAL KNEE ARTHROPLASTY;  Surgeon: Leandrew Koyanagi, MD;  Location: [place];  Service: Orthopedics;  Laterality: Left;   TUBAL LIGATION     Past Surgical History:  Procedure Laterality Date   APPLICATION OF WOUND VAC N/A 03/25/2021   Procedure: APPLICATION OF WOUND VAC;  Surgeon: [name], MD;  Location: [place];  Service: Orthopedics;  Laterality: N/A;   APPLICATION OF WOUND VAC N/A 03/30/2021   Procedure: WOUND VAC CHANGE 12x6x5;  Surgeon: [name], MD;  Location: [place];  Service: Orthopedics;  Laterality: N/A;   INCISION AND DRAINAGE OF WOUND N/A 03/25/2021   Procedure: LUMBAR POST OP INCISION IRRIGATION;  Surgeon: [name], MD;  Location: [place];  Service: Orthopedics;  Laterality: N/A;   JOINT REPLACEMENT     KNEE ARTHROSCOPY     LUMBAR WOUND DEBRIDEMENT N/A 03/30/2021   Procedure: REPEAT LUMBAR WOUND DEBRIDEMENT;  Surgeon: [name], MD;  Location: [place];  Service: Orthopedics;  Laterality: N/A;   right rotator cuff     ROTATOR CUFF REPAIR Left    TOTAL KNEE ARTHROPLASTY Left 09/08/2020   Procedure: LEFT TOTAL KNEE ARTHROPLASTY;  Surgeon: Leandrew Koyanagi, MD;  Location: [place];  Service: Orthopedics;  Laterality: Left;   TUBAL LIGATION     Past Medical History:  Diagnosis Date   Chronic kidney disease    Depression    Fibromyalgia    Gout    HTN (hypertension)    Insomnia    Morbid obesity (HCC)    Osteoarthritis    Palpitation    Sleep apnea    BP (!) 152/84   Pulse 69   Ht '5\' 2"'$  (1.575 m)   Wt 199 lb (90.3 kg)   SpO2 96%   BMI 36.40 kg/m   Opioid Risk Score:   Fall Risk Score:  `1  Depression screen [facility] 2/9     02/01/2022    8:37 AM 12/21/2021    1:31 PM 11/03/2021   10:30 AM 09/02/2021    2:06 PM 08/06/2021   11:25 AM 06/11/2021   11:18 AM 05/19/2021   10:55 AM  Depression screen PHQ 2/9  Decreased Interest 3 1 0 1 2 0 1  Down, Depressed,  Hopeless 2 1 0 1 2 0 3  PHQ - 2 Score 5 2 0 2 4 0 4  Altered sleeping 2      3  Tired, decreased energy 1      2  Change in appetite 0      2  Feeling bad or failure about yourself  1  0  Trouble concentrating 1      0  Moving slowly or fidgety/restless 0      0  Suicidal thoughts 0      0  PHQ-9 Score 10      11  Difficult doing work/chores Somewhat difficult           Review of Systems  Musculoskeletal:  Positive for back pain.       Bilateral shoulder pain Bilateral hand pain Right hand pain Left ankle pain   All other systems reviewed and are negative.     Objective:   Physical Exam        Assessment & Plan:  1, Chronic Bilateral Low Back Pain: S/P Lumbar Fusion: Dr Lorin Mercy Following. Continue HEP as Tolerated. Continue to Monitor. 11/05/2021 2. Post Operative Infection: ID Following: Continue Antibiotics. Continue to Monitor. 11/05/2021 3. Fibromyalgia: Continue HEP as Tolerated. Continue Lyrica. Continue to Monitor. 11/05/2021 4. Primary Osteoarthritis: Left Knee: S/P on 09/08/20: Dr Erlinda Hong: LEFT TOTAL KNEE ARTHROPLASTY. Continue HEP as Tolerated. Continue to Monitor.  5. Primary Osteoarthritis Right Knee: Continue HEP as Tolerated. Ortho Following. Continue to Monitor. 11/05/2021 6. Chronic Pain Syndrome: Refilled: Hydrocodone 7.5 mf /325 one tablet twice a day as needed for pain #60. Continue Amitriptyline  Refilled: Hydrocodone 10 mg/325 mg one tablet twice a day as needed for pain #60. We will continue the opioid monitoring program, this consists of regular clinic visits, examinations, urine drug screen, pill counts as well as use of [place] Controlled Substance Reporting system. A 12 month History has been reviewed on the [place] Controlled Substance Reporting System on 11/05/2021 7. Depression: Continue Cymbalta. Continue to Monitor. 11/05/2021   F/U in 2 months

## 2022-02-10 ENCOUNTER — Ambulatory Visit (INDEPENDENT_AMBULATORY_CARE_PROVIDER_SITE_OTHER): Payer: Commercial Managed Care - PPO

## 2022-02-10 ENCOUNTER — Encounter: Payer: Self-pay | Admitting: Registered Nurse

## 2022-02-10 ENCOUNTER — Ambulatory Visit (HOSPITAL_BASED_OUTPATIENT_CLINIC_OR_DEPARTMENT_OTHER): Payer: Commercial Managed Care - PPO | Admitting: Physical Therapy

## 2022-02-10 ENCOUNTER — Ambulatory Visit: Payer: Commercial Managed Care - PPO | Admitting: Orthopaedic Surgery

## 2022-02-10 DIAGNOSIS — M25572 Pain in left ankle and joints of left foot: Secondary | ICD-10-CM

## 2022-02-10 DIAGNOSIS — M5416 Radiculopathy, lumbar region: Secondary | ICD-10-CM

## 2022-02-10 NOTE — Progress Notes (Signed)
Office Visit Note   [name]           Date of Birth: 08-Apr-1950           MRN: [id number] Visit Date: 02/10/2022              Requested by: Isaac Bliss, [name], MD [place],  [place] [id number] PCP: Isaac Bliss, [name], MD   Assessment & Plan: Visit Diagnoses:  1. Lumbar radiculopathy   2. Pain in left ankle and joints of left foot     Plan: Impression is low back pain and left ankle sprain.  May have strained the posterior tibial tendon.  I recommend ASO brace and relative rest.  She should use Voltaren gel and oral NSAIDs for couple weeks.  For the low back there is no focal findings probably a lumbar strain.  Recommend symptomatic treatment as well.  Follow-up as needed.  Follow-Up Instructions: No follow-ups on file.   Orders:  Orders Placed This Encounter  Procedures   XR Ankle Complete Left   XR Lumbar Spine 2-3 Views   No orders of the defined types were placed in this encounter.     Procedures: No procedures performed   Clinical Data: No additional findings.   Subjective: Chief Complaint  Patient presents with   Left Foot - Pain    HPI Amy Hooper comes in today for evaluation of left foot and right sided lower back pain.  She slipped and fell at a concert about 2 weeks ago and has felt continued pain.  Denies any red flag symptoms.  Feels pain to the medial aspect of the ankle.  Denies any swelling.  Denies any radicular symptoms. Review of Systems  Constitutional: Negative.   HENT: Negative.    Eyes: Negative.   Respiratory: Negative.    Cardiovascular: Negative.   Endocrine: Negative.   Musculoskeletal: Negative.   Neurological: Negative.   Hematological: Negative.   Psychiatric/Behavioral: Negative.    All other systems reviewed and are negative.    Objective: Vital Signs: There were no vitals taken for this visit.  Physical Exam Vitals and nursing note reviewed.  Constitutional:       Appearance: She is well-developed.  Pulmonary:     Effort: Pulmonary effort is normal.  Skin:    General: Skin is warm.     Capillary Refill: Capillary refill takes less than 2 seconds.  Neurological:     Mental Status: She is alert and oriented to person, place, and time.  Psychiatric:        Behavior: Behavior normal.        Thought Content: Thought content normal.        Judgment: Judgment normal.     Ortho Exam Examination of the left ankle shows no significant swelling.  She has tenderness of the posterior tibial tendon near the tip of the medial malleolus.  She has no bony tenderness.  No ankle effusion.  She has good strength to manual muscle testing.  Normal sensation.  Examination of lumbar spine is nonfocal.  Fully healed surgical scar. Specialty Comments:  No specialty comments available.  Imaging: XR Ankle Complete Left  Result Date: 02/10/2022 Three-view x-rays of the left ankle shows no acute or structural abnormalities.  There is a small calcific density just distal to the tip of the medial malleolus.  XR Lumbar Spine 2-3 Views  Result Date: 02/10/2022 Status post instrumented lumbar fusion.  No acute or structural  abnormalities.    PMFS History: Patient Active Problem List   Diagnosis Date Noted   Acute on chronic renal insufficiency 03/27/2021   Postoperative complication of skin involving drainage from surgical wound 03/25/2021   Post op infection 03/24/2021   S/P lumbar fusion 03/24/2021   Lumbar stenosis 03/09/2021   Degenerative spondylolisthesis 02/19/2021   Primary osteoarthritis of left knee 09/08/2020   DJD (degenerative joint disease) of knee 09/08/2020   Status post total left knee replacement 09/08/2020   Spondylosis without myelopathy or radiculopathy, lumbar region 07/17/2020   Chronic pain syndrome 07/17/2020   Myalgia 07/17/2020   OSA (obstructive sleep apnea) 05/19/2020   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/17/2020    Sleep disturbance 04/17/2020   Chronic pain of both shoulders 03/03/2020   Pain in both hands 03/03/2020   Primary osteoarthritis of both knees 03/03/2020   Pain in both feet 03/03/2020   History of chronic kidney disease 03/03/2020   DDD (degenerative disc disease), lumbar 02/05/2020   Vitamin D deficiency 01/09/2020   Hyperlipidemia 01/09/2020   CKD (chronic kidney disease) stage 3, GFR 30-59 ml/min ([place]) 01/09/2020   HTN (hypertension)    Morbid obesity ([place])    Gout    Fibromyalgia    Depression    Insomnia    Past Medical History:  Diagnosis Date   Chronic kidney disease    Depression    Fibromyalgia    Gout    HTN (hypertension)    Insomnia    Morbid obesity ([place])    Osteoarthritis    Palpitation    Sleep apnea     Family History  Problem Relation Age of Onset   Hypothyroidism Mother    Hyperlipidemia Other    Kidney failure Father    Dementia Father    Gout Father    Arthritis Father    Prostate cancer Father     Past Surgical History:  Procedure Laterality Date   APPLICATION OF WOUND VAC N/A 03/25/2021   Procedure: APPLICATION OF WOUND VAC;  Surgeon: [name], MD;  Location: [place];  Service: Orthopedics;  Laterality: N/A;   APPLICATION OF WOUND VAC N/A 03/30/2021   Procedure: WOUND VAC CHANGE 12x6x5;  Surgeon: [name], MD;  Location: [place];  Service: Orthopedics;  Laterality: N/A;   INCISION AND DRAINAGE OF WOUND N/A 03/25/2021   Procedure: LUMBAR POST OP INCISION IRRIGATION;  Surgeon: [name], MD;  Location: [place];  Service: Orthopedics;  Laterality: N/A;   JOINT REPLACEMENT     KNEE ARTHROSCOPY     LUMBAR WOUND DEBRIDEMENT N/A 03/30/2021   Procedure: REPEAT LUMBAR WOUND DEBRIDEMENT;  Surgeon: [name], MD;  Location: [place];  Service: Orthopedics;  Laterality: N/A;   right rotator cuff     ROTATOR CUFF REPAIR Left    TOTAL KNEE ARTHROPLASTY Left 09/08/2020   Procedure: LEFT TOTAL KNEE ARTHROPLASTY;  Surgeon: Leandrew Koyanagi, MD;  Location:  [place];  Service: Orthopedics;  Laterality: Left;   TUBAL LIGATION     Social History   Occupational History   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

## 2022-02-14 ENCOUNTER — Other Ambulatory Visit: Payer: Self-pay | Admitting: Physical Medicine and Rehabilitation

## 2022-02-14 LAB — TOXASSURE SELECT,+ANTIDEPR,UR

## 2022-02-16 ENCOUNTER — Ambulatory Visit (HOSPITAL_BASED_OUTPATIENT_CLINIC_OR_DEPARTMENT_OTHER): Payer: Commercial Managed Care - PPO | Admitting: Physical Therapy

## 2022-02-16 DIAGNOSIS — M25561 Pain in right knee: Secondary | ICD-10-CM | POA: Diagnosis not present

## 2022-02-16 DIAGNOSIS — M5459 Other low back pain: Secondary | ICD-10-CM

## 2022-02-16 DIAGNOSIS — M6281 Muscle weakness (generalized): Secondary | ICD-10-CM

## 2022-02-16 DIAGNOSIS — G8929 Other chronic pain: Secondary | ICD-10-CM

## 2022-02-16 NOTE — Therapy (Signed)
OUTPATIENT PHYSICAL THERAPY LOWER EXTREMITY TREATMENT / PROGRESS NOTE   Patient Name: Amy Hooper MRN: [id number] DOB:05/13/1950, 71 y.o., female Today's Date: 02/16/2022   PT End of Session - 02/16/22 1607     Visit Number 56    PT Start Time 1605                 Past Medical History:  Diagnosis Date   Chronic kidney disease    Depression    Fibromyalgia    Gout    HTN (hypertension)    Insomnia    Morbid obesity ([place])    Osteoarthritis    Palpitation    Sleep apnea    Past Surgical History:  Procedure Laterality Date   APPLICATION OF WOUND VAC N/A 03/25/2021   Procedure: APPLICATION OF WOUND VAC;  Surgeon: [name], MD;  Location: [place];  Service: Orthopedics;  Laterality: N/A;   APPLICATION OF WOUND VAC N/A 03/30/2021   Procedure: WOUND VAC CHANGE 12x6x5;  Surgeon: [name], MD;  Location: [place];  Service: Orthopedics;  Laterality: N/A;   INCISION AND DRAINAGE OF WOUND N/A 03/25/2021   Procedure: LUMBAR POST OP INCISION IRRIGATION;  Surgeon: [name], MD;  Location: [place];  Service: Orthopedics;  Laterality: N/A;   JOINT REPLACEMENT     KNEE ARTHROSCOPY     LUMBAR WOUND DEBRIDEMENT N/A 03/30/2021   Procedure: REPEAT LUMBAR WOUND DEBRIDEMENT;  Surgeon: [name], MD;  Location: [place];  Service: Orthopedics;  Laterality: N/A;   right rotator cuff     ROTATOR CUFF REPAIR Left    TOTAL KNEE ARTHROPLASTY Left 09/08/2020   Procedure: LEFT TOTAL KNEE ARTHROPLASTY;  Surgeon: Leandrew Koyanagi, MD;  Location: [place];  Service: Orthopedics;  Laterality: Left;   TUBAL LIGATION     Patient Active Problem List   Diagnosis Date Noted   Acute on chronic renal insufficiency 03/27/2021   Postoperative complication of skin involving drainage from surgical wound 03/25/2021   Post op infection 03/24/2021   S/P lumbar fusion 03/24/2021   Lumbar stenosis 03/09/2021   Degenerative spondylolisthesis 02/19/2021   Primary osteoarthritis of left knee 09/08/2020    DJD (degenerative joint disease) of knee 09/08/2020   Status post total left knee replacement 09/08/2020   Spondylosis without myelopathy or radiculopathy, lumbar region 07/17/2020   Chronic pain syndrome 07/17/2020   Myalgia 07/17/2020   OSA (obstructive sleep apnea) 05/19/2020   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/17/2020   Sleep disturbance 04/17/2020   Chronic pain of both shoulders 03/03/2020   Pain in both hands 03/03/2020   Primary osteoarthritis of both knees 03/03/2020   Pain in both feet 03/03/2020   History of chronic kidney disease 03/03/2020   DDD (degenerative disc disease), lumbar 02/05/2020   Vitamin D deficiency 01/09/2020   Hyperlipidemia 01/09/2020   CKD (chronic kidney disease) stage 3, GFR 30-59 ml/min ([place]) 01/09/2020   HTN (hypertension)    Morbid obesity ([place])    Gout    Fibromyalgia    Depression    Insomnia     PCP: Isaac Bliss, [name], MD  REFERRING PROVIDER: Izora Ribas, MD  REFERRING DIAG: [phone] (ICD-10-CM) - Chronic pain of right knee  THERAPY DIAG:  No diagnosis found.  Rationale for Evaluation and Treatment Rehabilitation  ONSET DATE: Since 2018   IMAGING 01/22/22  IMPRESSION: 1. Subacute healing right anterolateral ninth rib fracture. 2. No acute intrathoracic process.  SUBJECTIVE STATEMENT: Left TKR on 09/04/20  ago with good results, Dec 2022 back surgery (Lumbar fusion) with complication of infection, on prophylactic dose of antibiotics x 1 year as per infectious disease will end in 4/24 then is planning on R TKA.  Pt reports residual weakness from back infection ue and core.Peripheral neuropathies right foot (numbness, pain)residual from lumbar fusion. Fibromyalgia x 5 years  Pt states her rib has been feeling good.  Pt denies any adverse effects after prior Rx.  Pt has been using cane since Lumbar surgery in Dec 2022.    Pt states she has had intermittent pain in L medial ankle/foot and spoke  to Dr. Lorin Mercy about it awhile ago.  Pt states he recommended orthotics.  Pt reports last weekend her ankle/foot began hurting worse and she has had more swelling.  Pt is seeing Dr. Erlinda Hong on Wednesday and may receive an injection.   FUNCTIONAL IMPROVEMENTS:  Sit/stands, less pain with taking 1st step, stairs.  Pt doesn't have to use cane with cooking.  Pt states she doesn't have knee pain in the AM.  Walking to mailbox and back though does have to sit down when getting back.  FUNCTIONAL LIMITATIONS: difficulty sleeping due to knee pain at night though is better.  Pt has to frequently sit while cooking.  Pt has difficulty with squatting and bending to get objects out of the oven.  Requires assistance when walking around her property.  Standing duration limited, gardening.   Pt saw MD on 11/22 and had x rays which showed arthritis.  Pt also had an x ray of lumbar.  Pt states MD gave her an ankle brace and voltaren.  Pt reports the ankle brace helps and she has been wearing it everyday.  Pt reports voltaren gel also helps.  MD note indicated the impression was LBP and left ankle sprain.  May have strained the posterior tibial tendon.  See imaging under objective for x ray findings.    Pt was twisting while doing a project for work when sitting which stirred up her rib pain.  Pt denies any adverse effects after prior Rx.    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 Yes:  NPRS scale:  R Knee: 6/10 current, 10/10 worst, 0/10 best R 9th rib area: 7/10 R hip current and best, 6/10 worst  Lumbar:  5/10 current, 9/10 worst, 2/10 best L medial foot:  8.5/10 Pain location: see above Pain description: ache Aggravating factors: movement or stillness Relieving factors: hydrocodone    PRECAUTIONS: Knee and Back  WEIGHT BEARING RESTRICTIONS No  FALLS:  Has patient fallen in last 6 months? 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  To be able to garden without cane   OBJECTIVE:   DIAGNOSTIC FINDINGS:  02/10/2022: L ankle x rays: no acute or structural abnormalities.  There is a small calcific density just distal to the tip of the medial malleolus.    Lumbar Spine x rays: Status post instrumented lumbar fusion.  No acute or structural abnormalities.    XR Lumbar spine: Impression: Status post single level L4-5 instrumented fusion satisfactory  radiographs. XR right knee: Moderately advanced tricompartmental DJD.   TODAY'S TREATMENT: Physical  Performance Testing: Reviewed current function and pain levels.  See below for pt education.   PATIENT SURVEYS:  FOTO Initial / Current:  36 / 41 with goal of 49    LOWER EXTREMITY ROM:                       Right knee: 0-118   LOWER EXTREMITY MMT:   MMT Right eval Left eval Right 11/20 Left 11/20  Hip flexion 4- 4 4-/5 4/5  Hip extension        Hip abduction 4 5 4+/5 ; 21.4 4/5; 21.3  Hip adduction 4 5    Hip internal rotation        Hip external rotation        Knee flexion 4 4+ 4/5 seated 5/5 seated  Knee extension     5/5 5/5  Ankle dorsiflexion        Ankle plantarflexion        Ankle inversion        Ankle eversion         (Blank rows = not tested)     FUNCTIONAL TESTS:  3.5 reps / 11 reps initial/current: 30 seconds chair stand test Timed up and go (TUG):  Initial/Current: 24.27 / 11 sec without an AD.     GAIT: Assistive device utilized: Single point cane Level of assistance: Complete Independence Comments:  decreased step length and cadence.    Aquatic therapy: Pt seen for aquatic therapy today.  Treatment took place in water 3.25-4.75 ft in depth at the [facility]. Temp of water was 92.  Pt entered/exited the pool independently via stairs using alternating pattern with bilat hand rail  Pt ambulated fwd and bwd and sidestepped at 3 ft 6 inch to 4 ft depth without UE support Squats 2 x 10 reps with UE support on pool wall Marching with UE support on yellow noodle 2x15 Heel raises and toe raises x 15 reps with bilat UE support on wall Squats 2x10 with UE support  Straddling noodle holding yellow hand floats: cycling; add/abd and skiing  Multiple widths/reps Ambulating fwd/bwd with multi-colored DB's by side    Pt requires the buoyancy and hydrostatic pressure of water for support, and to offload joints by unweighting joint load by at least 50 % in navel deep water and by at least 75-80% in chest to neck deep water.  Viscosity of the water is needed for resistance of strengthening. Water current perturbations provides challenge to standing balance requiring increased core activation.  PATIENT EDUCATION:  Education details: objective findings, progress, exercise form, exercise rationale, and POC Person educated: Patient Education method: Explanation, demonstration, verbal cues Education comprehension: verbalized understanding, returned demonstration, verbal cues required   HOME EXERCISE PROGRAM: From previous episode  ASSESSMENT:  CLINICAL IMPRESSION: Pt is progressing well with aquatic therapy.  She reports improved pain with daily activities and mobility including walking and sit/stand transfers.  Pt is able to ambulate in kitchen and home more without SPC though is limited with standing duration having to sit down multiple times while cooking.  She requires assistance when walking around her property including an AD and her husband.  Pt reports her rib is feeling much better though she has had some pain and swelling in L ankle as of last weekend.  Pt seeing MD on Wednesday.  Pt has demonstrates some improvement in LE strength though  continues to have deficits.  R knee flexion AROM has improved  form 110 deg initially to 118 deg currently.  Pt demonstrates great improvement in functional testing including 30s STS score improving from 3.5 reps to 11 reps and TUG score improving from 24 sec initially to 11 sec currently.  Though pt has made good progress, she has made limited progress toward her goals.  Pt met LTG #3 and is progressing toward other goals.  Pt feels better in the pool and responded well to Rx reporting improved pain from 7/10 in R knee before Rx to 0/10 after Rx.  Pt should benefit from cont skilled PT services to address impairments and goals and to improve overall function.    OBJECTIVE IMPAIRMENTS Abnormal gait, decreased activity tolerance, decreased balance, decreased mobility, difficulty walking, decreased strength, and pain.   ACTIVITY LIMITATIONS lifting, bending, squatting, stairs, transfers, bed mobility, and locomotion level  PARTICIPATION LIMITATIONS: cleaning, shopping, community activity, and yard work  [place], Past/current experiences, and 1-2 comorbidities: Lumbar dysfunction, OA,   are also affecting patient's functional outcome.   REHAB POTENTIAL: Good  CLINICAL DECISION MAKING: Evolving/moderate complexity  EVALUATION COMPLEXITY: Moderate   GOALS: Goals reviewed with patient? Yes  SHORT TERM GOALS: Target date: 01/21/22 Pt will initiate access to pool/gym in order to be compliant with final HEP land and aquatic based Baseline:no access Goal status: Ongoing  2.  Pt to be ale to tolerate amb up towards 15 mins to demonstrate improved toleration to activity Baseline: 7 Goal status: Ongoing  3.  Pt will improve on Berg balance test to 40/46 Baseline: 31/56 Goal status: Not assessed  4.  Pt will improve on Right hip and knee flex to 1/2  grade without pain with resistance Baseline: 4- to 4/5 Goal status: ongoing  LONG TERM GOALS: Target date:03/22/2022  Pt to meet  Foto goal of 49% Baseline: 36; currently 41 Goal status: Progressing  2.  Pt to be indep with final HEP Baseline:  Goal status: ongoing  3.  Pt will improve on 30s STS test to 7 or> to demonstrate improved le strength Baseline: 3.5 Goal status: Goal met  4.  Pt will improve in bilat shoulder str up to or > 1 full grade Baseline: 3- to 3+/5 Goal status: not tested due to R rib fracture     PLAN: PT FREQUENCY: 1-2x/week  PT DURATION: 6 weeks  PLANNED INTERVENTIONS: Therapeutic exercises, Therapeutic activity, Neuromuscular re-education, Balance training, Gait training, Patient/Family education, Self Care, Joint mobilization, Joint manipulation, Stair training, Aquatic Therapy, Dry Needling, Cryotherapy, Moist heat, Taping, Ultrasound, Manual therapy, and Re-evaluation  PLAN FOR NEXT SESSION:   May decrease exercises depending on pt tolerance due to recent fall and increased pain.  Pt to stay in aquatic therapy currently due to pain and rib fx.  continue RLE strengthening, core and bilat shoulder strengthening, balance retraining. Assess response to ktape application; if tolerated - retape with pt in more upright position (vs leaning forward).      Selinda Michaels III PT, DPT 02/16/22 4:08 PM

## 2022-02-17 ENCOUNTER — Ambulatory Visit (INDEPENDENT_AMBULATORY_CARE_PROVIDER_SITE_OTHER)
Admission: RE | Admit: 2022-02-17 | Discharge: 2022-02-17 | Disposition: A | Discharge status: Home / Self Care | Payer: Commercial Managed Care - PPO | Source: Ambulatory Visit | Attending: Internal Medicine | Admitting: Internal Medicine

## 2022-02-17 ENCOUNTER — Other Ambulatory Visit: Payer: Commercial Managed Care - PPO

## 2022-02-17 ENCOUNTER — Encounter (HOSPITAL_BASED_OUTPATIENT_CLINIC_OR_DEPARTMENT_OTHER): Payer: Self-pay | Admitting: Physical Therapy

## 2022-02-17 DIAGNOSIS — Z780 Asymptomatic menopausal state: Secondary | ICD-10-CM

## 2022-02-17 DIAGNOSIS — Z13820 Encounter for screening for osteoporosis: Secondary | ICD-10-CM

## 2022-02-17 DIAGNOSIS — E559 Vitamin D deficiency, unspecified: Secondary | ICD-10-CM

## 2022-02-18 ENCOUNTER — Encounter (HOSPITAL_BASED_OUTPATIENT_CLINIC_OR_DEPARTMENT_OTHER): Payer: Self-pay | Admitting: Physical Therapy

## 2022-02-18 ENCOUNTER — Ambulatory Visit (HOSPITAL_BASED_OUTPATIENT_CLINIC_OR_DEPARTMENT_OTHER): Payer: Commercial Managed Care - PPO | Admitting: Physical Therapy

## 2022-02-18 DIAGNOSIS — G8929 Other chronic pain: Secondary | ICD-10-CM

## 2022-02-18 DIAGNOSIS — M6281 Muscle weakness (generalized): Secondary | ICD-10-CM

## 2022-02-18 DIAGNOSIS — M25561 Pain in right knee: Secondary | ICD-10-CM | POA: Diagnosis not present

## 2022-02-18 DIAGNOSIS — M5459 Other low back pain: Secondary | ICD-10-CM

## 2022-02-18 NOTE — Therapy (Signed)
OUTPATIENT PHYSICAL THERAPY LOWER EXTREMITY TREATMENT    Patient Name: Amy Hooper MRN: [id number] DOB:March 14, 1951, 71 y.o., female Today's Date: 02/19/2022   PT End of Session - 02/18/22 1657     Visit Number 13    Number of Visits 22    Date for PT Re-Evaluation 03/22/22    Authorization Type U[employer]    PT Start Time 1608    PT Stop Time 9211    PT Time Calculation (min) 42 min    Activity Tolerance Patient tolerated treatment well    Behavior During Therapy WFL for tasks assessed/performed                 Past Medical History:  Diagnosis Date   Chronic kidney disease    Depression    Fibromyalgia    Gout    HTN (hypertension)    Insomnia    Morbid obesity ([place])    Osteoarthritis    Palpitation    Sleep apnea    Past Surgical History:  Procedure Laterality Date   APPLICATION OF WOUND VAC N/A 03/25/2021   Procedure: APPLICATION OF WOUND VAC;  Surgeon: [name], MD;  Location: [place];  Service: Orthopedics;  Laterality: N/A;   APPLICATION OF WOUND VAC N/A 03/30/2021   Procedure: WOUND VAC CHANGE 12x6x5;  Surgeon: [name], MD;  Location: [place];  Service: Orthopedics;  Laterality: N/A;   INCISION AND DRAINAGE OF WOUND N/A 03/25/2021   Procedure: LUMBAR POST OP INCISION IRRIGATION;  Surgeon: [name], MD;  Location: [place];  Service: Orthopedics;  Laterality: N/A;   JOINT REPLACEMENT     KNEE ARTHROSCOPY     LUMBAR WOUND DEBRIDEMENT N/A 03/30/2021   Procedure: REPEAT LUMBAR WOUND DEBRIDEMENT;  Surgeon: [name], MD;  Location: [place];  Service: Orthopedics;  Laterality: N/A;   right rotator cuff     ROTATOR CUFF REPAIR Left    TOTAL KNEE ARTHROPLASTY Left 09/08/2020   Procedure: LEFT TOTAL KNEE ARTHROPLASTY;  Surgeon: Leandrew Koyanagi, MD;  Location: [place];  Service: Orthopedics;  Laterality: Left;   TUBAL LIGATION     Patient Active Problem List   Diagnosis Date Noted   Acute on chronic renal insufficiency 03/27/2021   Postoperative  complication of skin involving drainage from surgical wound 03/25/2021   Post op infection 03/24/2021   S/P lumbar fusion 03/24/2021   Lumbar stenosis 03/09/2021   Degenerative spondylolisthesis 02/19/2021   Primary osteoarthritis of left knee 09/08/2020   DJD (degenerative joint disease) of knee 09/08/2020   Status post total left knee replacement 09/08/2020   Spondylosis without myelopathy or radiculopathy, lumbar region 07/17/2020   Chronic pain syndrome 07/17/2020   Myalgia 07/17/2020   OSA (obstructive sleep apnea) 05/19/2020   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/17/2020   Sleep disturbance 04/17/2020   Chronic pain of both shoulders 03/03/2020   Pain in both hands 03/03/2020   Primary osteoarthritis of both knees 03/03/2020   Pain in both feet 03/03/2020   History of chronic kidney disease 03/03/2020   DDD (degenerative disc disease), lumbar 02/05/2020   Vitamin D deficiency 01/09/2020   Hyperlipidemia 01/09/2020   CKD (chronic kidney disease) stage 3, GFR 30-59 ml/min ([place]) 01/09/2020   HTN (hypertension)    Morbid obesity ([place])    Gout    Fibromyalgia    Depression    Insomnia     PCP: Isaac Bliss, [name], MD  REFERRING PROVIDER: Izora Ribas, MD  REFERRING  DIAG: M25.561,G89.29 (ICD-10-CM) - Chronic pain of right knee  THERAPY DIAG:  Chronic pain of right knee  Muscle weakness-general  Other low back pain  Rationale for Evaluation and Treatment Rehabilitation  ONSET DATE: Since 2018   IMAGING 01/22/22  IMPRESSION: 1. Subacute healing right anterolateral ninth rib fracture. 2. No acute intrathoracic process.  SUBJECTIVE STATEMENT: Left TKR on 09/04/20 ago with good results, Dec 2022 back surgery (Lumbar fusion) with complication of infection, on prophylactic dose of antibiotics x 1 year as per infectious disease will end in 4/24 then is planning on R TKA.  Pt reports residual weakness from back infection ue and core.  Peripheral  neuropathies right foot (numbness, pain)residual from lumbar fusion. Fibromyalgia x 5 years  FUNCTIONAL IMPROVEMENTS:  Sit/stands, less pain with taking 1st step, stairs.  Pt doesn't have to use cane with cooking.  Pt states she doesn't have knee pain in the AM.  Walking to mailbox and back though does have to sit down when getting back.  FUNCTIONAL LIMITATIONS: difficulty sleeping due to knee pain at night though is better.  Pt has to frequently sit while cooking.  Pt has difficulty with squatting and bending to get objects out of the oven.  Requires assistance when walking around her property.  Standing duration limited, gardening.   Pt reports increased rib and L foot pain after prior Rx.  Pt states she had increased pain walking to her neighbor's mailbox.  Pt is wearing ankle brace on L which helps.  Pt states she has increased pain today though has no knee pain.      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 Yes:  NPRS scale:  R Knee: 0/10 current, 10/10 worst, 0/10 best R 9th rib area: 8.5/10 R hip current and best, 6/10 worst  Lumbar:  0/10 current, 9/10 worst, 2/10 best L medial foot:  9/10 Pain location: see above Pain description: ache Aggravating factors: movement or stillness Relieving factors: hydrocodone    PRECAUTIONS: Knee and Back  WEIGHT BEARING RESTRICTIONS No  FALLS:  Has patient fallen in last 6 months? 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  To be able to garden without cane   OBJECTIVE:   DIAGNOSTIC FINDINGS:  02/10/2022: L ankle x rays: no acute or structural abnormalities.  There is a small calcific density just distal to the  tip of the medial malleolus.    Lumbar Spine x rays: Status post instrumented lumbar fusion.  No acute or structural abnormalities.    XR Lumbar spine: Impression: Status post single level L4-5 instrumented fusion satisfactory  radiographs. XR right knee: Moderately advanced tricompartmental DJD.   TODAY'S TREATMENT:  Aquatic therapy: Pt seen for aquatic therapy today.  Treatment took place in water 3.5-4.75 ft in depth at the [facility] pool. Temp of water was 92.  Pt entered/exited the pool independently via stairs using alternating pattern with bilat hand rail  Pt ambulated fwd and bwd and sidestepped at 3 ft 6 inch to 4 ft depth without UE support Squats 2 x 10 reps with UE support on pool wall Marching with UE support on yellow noodle 2x15 Squats 2x10 with UE  support  SLS with bilat Ue's holding DB's 3x30 sec on R LE Straddling noodle holding yellow hand floats: cycling; add/abd and skiing  Multiple widths/reps Ambulating fwd/bwd with multi-colored DB's by side x 3 laps   Pt requires the buoyancy and hydrostatic pressure of water for support, and to offload joints by unweighting joint load by at least 50 % in navel deep water and by at least 75-80% in chest to neck deep water.  Viscosity of the water is needed for resistance of strengthening. Water current perturbations provides challenge to standing balance requiring increased core activation.  PATIENT EDUCATION:  Education details:  exercise form, exercise rationale, and POC Person educated: Patient Education method: Explanation, demonstration, verbal cues Education comprehension: verbalized understanding, returned demonstration, verbal cues required   HOME EXERCISE PROGRAM: From previous episode  ASSESSMENT:  CLINICAL IMPRESSION: Pt presents to Rx denying knee pain though has increased rib and foot pain.  PT limited certain ankle/foot exercises in the pool to limit stress on L foot.  Pt performed aquatic  exercises well with cuing for correct form.  Pt continued to have no knee pain after Rx.  She responded well to Rx reporting improved pain to 0/10 after Rx just having L foot soreness.  Pt should benefit from continued aquatic therapy for a couple of weeks due to widespread pain and then possible transition to land.     OBJECTIVE IMPAIRMENTS Abnormal gait, decreased activity tolerance, decreased balance, decreased mobility, difficulty walking, decreased strength, and pain.   ACTIVITY LIMITATIONS lifting, bending, squatting, stairs, transfers, bed mobility, and locomotion level  PARTICIPATION LIMITATIONS: cleaning, shopping, community activity, and yard work  [place], Past/current experiences, and 1-2 comorbidities: Lumbar dysfunction, OA,   are also affecting patient's functional outcome.   REHAB POTENTIAL: Good  CLINICAL DECISION MAKING: Evolving/moderate complexity  EVALUATION COMPLEXITY: Moderate   GOALS: Goals reviewed with patient? Yes  SHORT TERM GOALS: Target date: 01/21/22 Pt will initiate access to pool/gym in order to be compliant with final HEP land and aquatic based Baseline:no access Goal status: Ongoing  2.  Pt to be ale to tolerate amb up towards 15 mins to demonstrate improved toleration to activity Baseline: 7 Goal status: Ongoing  3.  Pt will improve on Berg balance test to 40/46 Baseline: 31/56 Goal status: Not assessed  4.  Pt will improve on Right hip and knee flex to 1/2  grade without pain with resistance Baseline: 4- to 4/5 Goal status: ongoing  LONG TERM GOALS: Target date:03/22/2022  Pt to meet Foto goal of 49% Baseline: 36; currently 41 Goal status: Progressing  2.  Pt to be indep with final HEP Baseline:  Goal status: ongoing  3.  Pt will improve on 30s STS test to 7 or> to demonstrate improved le strength Baseline: 3.5 Goal status: Goal met  4.  Pt will improve in bilat shoulder str up to or > 1 full grade Baseline: 3- to  3+/5 Goal status: not tested due to R rib fracture     PLAN: PT FREQUENCY: 1-2x/week  PT DURATION: 6 weeks  PLANNED INTERVENTIONS: Therapeutic exercises, Therapeutic activity, Neuromuscular re-education, Balance training, Gait training, Patient/Family education, Self Care, Joint mobilization, Joint manipulation, Stair training, Aquatic Therapy, Dry Needling, Cryotherapy, Moist heat, Taping, Ultrasound, Manual therapy, and Re-evaluation  PLAN FOR NEXT SESSION:   Cont with aquatic therapy and monitor sx's of L foot and R rib.    Selinda Michaels III PT, DPT 02/19/22 11:23 AM

## 2022-02-22 ENCOUNTER — Other Ambulatory Visit: Payer: Self-pay | Admitting: Internal Medicine

## 2022-02-24 ENCOUNTER — Encounter (HOSPITAL_BASED_OUTPATIENT_CLINIC_OR_DEPARTMENT_OTHER): Payer: Self-pay | Admitting: Physical Therapy

## 2022-02-24 ENCOUNTER — Ambulatory Visit (HOSPITAL_BASED_OUTPATIENT_CLINIC_OR_DEPARTMENT_OTHER): Payer: Commercial Managed Care - PPO | Attending: Physical Medicine and Rehabilitation | Admitting: Physical Therapy

## 2022-02-24 DIAGNOSIS — G8929 Other chronic pain: Secondary | ICD-10-CM | POA: Insufficient documentation

## 2022-02-24 DIAGNOSIS — M25561 Pain in right knee: Secondary | ICD-10-CM | POA: Diagnosis present

## 2022-02-24 DIAGNOSIS — M6281 Muscle weakness (generalized): Secondary | ICD-10-CM | POA: Diagnosis present

## 2022-02-24 DIAGNOSIS — M797 Fibromyalgia: Secondary | ICD-10-CM | POA: Insufficient documentation

## 2022-02-24 DIAGNOSIS — M5459 Other low back pain: Secondary | ICD-10-CM | POA: Insufficient documentation

## 2022-02-24 NOTE — Therapy (Signed)
OUTPATIENT PHYSICAL THERAPY LOWER EXTREMITY TREATMENT    Patient Name: Kenae Lindquist MRN: [id number] DOB:1951/02/13, 71 y.o., female Today's Date: 02/24/2022   PT End of Session - 02/24/22 0951     Visit Number 14    Number of Visits 22    Date for PT Re-Evaluation 03/22/22    Authorization Type [employer]    PT Start Time 0945    PT Stop Time 1030    PT Time Calculation (min) 45 min    Activity Tolerance Patient tolerated treatment well    Behavior During Therapy WFL for tasks assessed/performed                 Past Medical History:  Diagnosis Date   Chronic kidney disease    Depression    Fibromyalgia    Gout    HTN (hypertension)    Insomnia    Morbid obesity ([place])    Osteoarthritis    Palpitation    Sleep apnea    Past Surgical History:  Procedure Laterality Date   APPLICATION OF WOUND VAC N/A 03/25/2021   Procedure: APPLICATION OF WOUND VAC;  Surgeon: [name], MD;  Location: [facility];  Service: Orthopedics;  Laterality: N/A;   APPLICATION OF WOUND VAC N/A 03/30/2021   Procedure: WOUND VAC CHANGE 12x6x5;  Surgeon: [name], MD;  Location: [facility];  Service: Orthopedics;  Laterality: N/A;   INCISION AND DRAINAGE OF WOUND N/A 03/25/2021   Procedure: LUMBAR POST OP INCISION IRRIGATION;  Surgeon: [name], MD;  Location: [place];  Service: Orthopedics;  Laterality: N/A;   JOINT REPLACEMENT     KNEE ARTHROSCOPY     LUMBAR WOUND DEBRIDEMENT N/A 03/30/2021   Procedure: REPEAT LUMBAR WOUND DEBRIDEMENT;  Surgeon: [name], MD;  Location: [place];  Service: Orthopedics;  Laterality: N/A;   right rotator cuff     ROTATOR CUFF REPAIR Left    TOTAL KNEE ARTHROPLASTY Left 09/08/2020   Procedure: LEFT TOTAL KNEE ARTHROPLASTY;  Surgeon: Leandrew Koyanagi, MD;  Location: [place];  Service: Orthopedics;  Laterality: Left;   TUBAL LIGATION     Patient Active Problem List   Diagnosis Date Noted   Acute on chronic renal insufficiency 03/27/2021   Postoperative  complication of skin involving drainage from surgical wound 03/25/2021   Post op infection 03/24/2021   S/P lumbar fusion 03/24/2021   Lumbar stenosis 03/09/2021   Degenerative spondylolisthesis 02/19/2021   Primary osteoarthritis of left knee 09/08/2020   DJD (degenerative joint disease) of knee 09/08/2020   Status post total left knee replacement 09/08/2020   Spondylosis without myelopathy or radiculopathy, lumbar region 07/17/2020   Chronic pain syndrome 07/17/2020   Myalgia 07/17/2020   OSA (obstructive sleep apnea) 05/19/2020   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/17/2020   Sleep disturbance 04/17/2020   Chronic pain of both shoulders 03/03/2020   Pain in both hands 03/03/2020   Primary osteoarthritis of both knees 03/03/2020   Pain in both feet 03/03/2020   History of chronic kidney disease 03/03/2020   DDD (degenerative disc disease), lumbar 02/05/2020   Vitamin D deficiency 01/09/2020   Hyperlipidemia 01/09/2020   CKD (chronic kidney disease) stage 3, GFR 30-59 ml/min ([place]) 01/09/2020   HTN (hypertension)    Morbid obesity ([place])    Gout    Fibromyalgia    Depression    Insomnia     PCP: Isaac Bliss, [name], MD  REFERRING PROVIDER: Izora Ribas, MD  REFERRING  DIAG: M25.561,G89.29 (ICD-10-CM) - Chronic pain of right knee  THERAPY DIAG:  Chronic pain of right knee  Muscle weakness-general  Other low back pain  Fibromyalgia  Rationale for Evaluation and Treatment Rehabilitation  ONSET DATE: Since 2018   IMAGING 01/22/22  IMPRESSION: 1. Subacute healing right anterolateral ninth rib fracture. 2. No acute intrathoracic process.  SUBJECTIVE STATEMENT: Left TKR on 09/04/20 ago with good results, Dec 2022 back surgery (Lumbar fusion) with complication of infection, on prophylactic dose of antibiotics x 1 year as per infectious disease will end in 4/24 then is planning on R TKA.  Pt reports residual weakness from back infection ue and  core.  Peripheral neuropathies right foot (numbness, pain)residual from lumbar fusion. Fibromyalgia x 5 years  FUNCTIONAL IMPROVEMENTS:  Sit/stands, less pain with taking 1st step, stairs.  Pt doesn't have to use cane with cooking.  Pt states she doesn't have knee pain in the AM.  Walking to mailbox and back though does have to sit down when getting back.  FUNCTIONAL LIMITATIONS: difficulty sleeping due to knee pain at night though is better.  Pt has to frequently sit while cooking.  Pt has difficulty with squatting and bending to get objects out of the oven.  Requires assistance when walking around her property.  Standing duration limited, gardening.   CURRENT SUBJECTIVE Pt reports right knee is good. Ankle and rib pain high 8-9/10. Pt with antalgic gait entering pool.  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 Yes:  NPRS scale:  R Knee: 0/10 current, 10/10 worst, 0/10 best R 9th rib area: 8.5/10 R hip current and best, 6/10 worst  Lumbar:  8-9/10 current, 9/10 worst, 2/10 best L medial foot:  0/10 Pain location: see above Pain description: ache Aggravating factors: movement or stillness Relieving factors: hydrocodone    PRECAUTIONS: Knee and Back  WEIGHT BEARING RESTRICTIONS No  FALLS:  Has patient fallen in last 6 months? 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  To be able to garden without cane   OBJECTIVE:   DIAGNOSTIC FINDINGS:  02/10/2022: L ankle x rays: no acute or structural abnormalities.  There is a small calcific density just distal to the tip of the medial malleolus.    Lumbar Spine x rays:  Status post instrumented lumbar fusion.  No acute or structural abnormalities.    XR Lumbar spine: Impression: Status post single level L4-5 instrumented fusion satisfactory  radiographs. XR right knee: Moderately advanced tricompartmental DJD.   TODAY'S TREATMENT:  Aquatic therapy: Pt seen for aquatic therapy today.  Treatment took place in water 3.5-4.75 ft in depth at the [facility] pool. Temp of water was 92.  Pt entered/exited the pool independently via stairs using alternating pattern with bilat hand rail  Pt ambulated fwd and bwd and sidestepped at 3 ft 6 inch to 4 ft depth without UE support   *Standing DF/PF holding to wall  -DF/PF simulated heel strike and toe off weight shifting  *Ankle strategy/proprioception ex with pink ankle fin     oscillations in  sagittal and frontal planes  *Stretching ue support on  wall: "L", QL    *Squats 5 reps with UE support on pool wall; x 5 ue on hand buoys  *walking forward and back with kb extended for resistance increasing speed as able *grapevine with ue support on hand buoys. Cues for execution *cycling on noodle    Pt requires the buoyancy and hydrostatic pressure of water for support, and to offload joints by unweighting joint load by at least 50 % in navel deep water and by at least 75-80% in chest to neck deep water.  Viscosity of the water is needed for resistance of strengthening. Water current perturbations provides challenge to standing balance requiring increased core activation.  PATIENT EDUCATION:  Education details:  exercise form, exercise rationale, and POC Person educated: Patient Education method: Explanation, demonstration, verbal cues Education comprehension: verbalized understanding, returned demonstration, verbal cues required   HOME EXERCISE PROGRAM: From previous episode  ASSESSMENT:  CLINICAL IMPRESSION: Pt tolerates progression of ankle, knee and LB/core strengthening well.  Worked on  proprioception in ankle and knee for improved balance strategy's. No LOB. Some discomfort reported in left ankle with tasks. Overall reduction in pain upon completion. Pt to be DC at end of month prompted by insurance change.  Will try to schedule land based appointments if able as pt feels certain she will not have access to pool to complete aquatic HEP      OBJECTIVE IMPAIRMENTS Abnormal gait, decreased activity tolerance, decreased balance, decreased mobility, difficulty walking, decreased strength, and pain.   ACTIVITY LIMITATIONS lifting, bending, squatting, stairs, transfers, bed mobility, and locomotion level  PARTICIPATION LIMITATIONS: cleaning, shopping, community activity, and yard work  [place], Past/current experiences, and 1-2 comorbidities: Lumbar dysfunction, OA,   are also affecting patient's functional outcome.   REHAB POTENTIAL: Good  CLINICAL DECISION MAKING: Evolving/moderate complexity  EVALUATION COMPLEXITY: Moderate   GOALS: Goals reviewed with patient? Yes  SHORT TERM GOALS: Target date: 01/21/22 Pt will initiate access to pool/gym in order to be compliant with final HEP land and aquatic based Baseline:no access Goal status: Ongoing  2.  Pt to be ale to tolerate amb up towards 15 mins to demonstrate improved toleration to activity Baseline: 7 Goal status: Ongoing  3.  Pt will improve on Berg balance test to 40/46 Baseline: 31/56 Goal status: Not assessed  4.  Pt will improve on Right hip and knee flex to 1/2  grade without pain with resistance Baseline: 4- to 4/5 Goal status: ongoing  LONG TERM GOALS: Target date:03/22/2022  Pt to meet Foto goal of 49% Baseline: 36; currently 41 Goal status: Progressing  2.  Pt to be indep with final HEP Baseline:  Goal status: ongoing  3.  Pt will improve on 30s STS test to 7 or> to demonstrate improved le strength Baseline: 3.5 Goal status: Goal met  4.  Pt will improve in bilat shoulder  str up to or > 1 full grade Baseline: 3- to 3+/5 Goal status: not tested due to R rib fracture     PLAN: PT FREQUENCY: 1-2x/week  PT DURATION: 6 weeks  PLANNED INTERVENTIONS: Therapeutic exercises, Therapeutic activity, Neuromuscular re-education, Balance training, Gait training, Patient/Family education, Self Care, Joint mobilization, Joint manipulation, Stair training, Aquatic Therapy, Dry Needling, Cryotherapy, Moist heat, Taping, Ultrasound, Manual therapy, and Re-evaluation  PLAN FOR NEXT SESSION:   Cont with aquatic therapy and monitor sx's of L foot and R rib.    Stanton Kidney Tharon Aquas) Teruko Joswick MPT 02/24/22 5:34 PM

## 2022-03-01 ENCOUNTER — Ambulatory Visit (INDEPENDENT_AMBULATORY_CARE_PROVIDER_SITE_OTHER): Payer: Commercial Managed Care - PPO | Admitting: Psychologist

## 2022-03-01 DIAGNOSIS — F320 Major depressive disorder, single episode, mild: Secondary | ICD-10-CM

## 2022-03-01 NOTE — Progress Notes (Signed)
                Smith Mcnicholas, PsyD 

## 2022-03-01 NOTE — Progress Notes (Signed)
[facility] Counselor/Therapist Progress Note  Patient ID: Amy Hooper, MRN: [id number],    Date: 03/01/2022  Time Spent: 09:07 am to 09:46 am; total time: 39 minutes   This session was held via in person. The patient consented to in-person therapy and was in the clinician's office. Limits of confidentiality were discussed with the patient.    Treatment Type: Individual Therapy  Reported Symptoms: Depression  Mental Status Exam: Appearance:  Well Groomed     Behavior: Appropriate  Motor: Normal  Speech/Language:  Clear and Coherent  Affect: Appropriate  Mood: normal  Thought process: normal  Thought content:   WNL  Sensory/Perceptual disturbances:   WNL  Orientation: oriented to person, place, and time/date  Attention: Good  Concentration: Good  Memory: WNL  Fund of knowledge:  Good  Insight:   Good  Judgment:  Good  Impulse Control: Good   Risk Assessment: Danger to Self:  No Self-injurious Behavior: No Danger to Others: No Duty to Warn:no Physical Aggression / Violence:No  Access to Firearms a concern: No  Gang Involvement:No   Subjective: Beginning the session, patient described herself as doing well while reviewing events since the intake. Per the patient, she is unsure if she can continue with counseling due to a change in insurance providers. After reviewing the treatment plan, patient reflected on establishing financial boundaries with her family. She processed thoughts and emotions. She also participated in mindfulness. As part of talking about financial boundaries, she explored and processed her values. She denied suicidal and homicidal ideation.    Interventions:  Worked on developing a therapeutic relationship with the patient using active listening and reflective statements. Provided emotional support using empathy and validation. Reviewed the treatment plan with the patient. Reviewed events since the intake occurred. Normalized and  validated expressed thoughts and emotions. Identified goals for the session. Used socratic questions to assist the patient gain insight into self. Assisted in problem solving. Explored reasons as to inform family members of behaviors. Processed values and explored values. Provided psychoeducation about mindfulness. Practiced and processed mindfulness. Assisted in problem solving. Assessed for suicidal and homicidal ideation.   Homework: Reflect on values in relation to establishing financial boundaries with family  Next Session: NA  Diagnosis: F32.0 major depressive affective disorder, single episode, mild   Plan:   Goals Alleviate depressive symptoms Recognize, accept, and cope with depressive feelings Develop healthy thinking patterns Develop healthy interpersonal relationships  Objectives target date for all objectives is 02/06/2023 Cooperate with a medication evaluation by a physician Verbalize an accurate understanding of depression Verbalize an understanding of the treatment Identify and replace thoughts that support depression Learn and implement behavioral strategies Verbalize an understanding and resolution of current interpersonal problems Learn and implement problem solving and decision making skills Learn and implement conflict resolution skills to resolve interpersonal problems Verbalize an understanding of healthy and unhealthy emotions verbalize insight into how past relationships may be influence current experiences with depression Use mindfulness and acceptance strategies and increase value based behavior  Increase hopeful statements about the future.  Interventions Consistent with treatment model, discuss how change in cognitive, behavioral, and interpersonal can help client alleviate depression CBT Behavioral activation help the client explore the relationship, nature of the dispute,  Help the client develop new interpersonal skills and relationships Conduct  Problem so living therapy Teach conflict resolution skills Use a process-experiential approach Conduct TLDP Conduct ACT Evaluate need for psychotropic medication Monitor adherence to medication   The patient and clinician reviewed  the treatment plan on 03/01/2022. The patient approved of the treatment plan.   Conception Chancy, PsyD

## 2022-03-02 ENCOUNTER — Other Ambulatory Visit: Payer: Self-pay | Admitting: Internal Medicine

## 2022-03-03 ENCOUNTER — Encounter: Payer: Self-pay | Admitting: Internal Medicine

## 2022-03-03 ENCOUNTER — Encounter (HOSPITAL_BASED_OUTPATIENT_CLINIC_OR_DEPARTMENT_OTHER): Payer: Self-pay | Admitting: Physical Therapy

## 2022-03-03 ENCOUNTER — Ambulatory Visit (HOSPITAL_BASED_OUTPATIENT_CLINIC_OR_DEPARTMENT_OTHER): Payer: Commercial Managed Care - PPO | Admitting: Physical Therapy

## 2022-03-03 DIAGNOSIS — M6281 Muscle weakness (generalized): Secondary | ICD-10-CM

## 2022-03-03 DIAGNOSIS — M25561 Pain in right knee: Secondary | ICD-10-CM | POA: Diagnosis not present

## 2022-03-03 DIAGNOSIS — G8929 Other chronic pain: Secondary | ICD-10-CM

## 2022-03-03 DIAGNOSIS — M8580 Other specified disorders of bone density and structure, unspecified site: Secondary | ICD-10-CM | POA: Insufficient documentation

## 2022-03-03 DIAGNOSIS — M5459 Other low back pain: Secondary | ICD-10-CM

## 2022-03-03 DIAGNOSIS — M797 Fibromyalgia: Secondary | ICD-10-CM

## 2022-03-03 NOTE — Therapy (Signed)
OUTPATIENT PHYSICAL THERAPY LOWER EXTREMITY TREATMENT    Patient Name: Amy Hooper MRN: [id number] DOB:12-03-1950, 71 y.o., female Today's Date: 03/03/2022   PT End of Session - 03/03/22 1309     Visit Number 15    Number of Visits 22    Date for PT Re-Evaluation 03/22/22    Authorization Type UHC    PT Start Time 1201    PT Stop Time 1245    PT Time Calculation (min) 44 min    Activity Tolerance Patient tolerated treatment well    Behavior During Therapy WFL for tasks assessed/performed                 Past Medical History:  Diagnosis Date   Chronic kidney disease    Depression    Fibromyalgia    Gout    HTN (hypertension)    Insomnia    Morbid obesity ([place])    Osteoarthritis    Palpitation    Sleep apnea    Past Surgical History:  Procedure Laterality Date   APPLICATION OF WOUND VAC N/A 03/25/2021   Procedure: APPLICATION OF WOUND VAC;  Surgeon: [name], MD;  Location: [place];  Service: Orthopedics;  Laterality: N/A;   APPLICATION OF WOUND VAC N/A 03/30/2021   Procedure: WOUND VAC CHANGE 12x6x5;  Surgeon: [name], MD;  Location: [place];  Service: Orthopedics;  Laterality: N/A;   INCISION AND DRAINAGE OF WOUND N/A 03/25/2021   Procedure: LUMBAR POST OP INCISION IRRIGATION;  Surgeon: [name], MD;  Location: [place];  Service: Orthopedics;  Laterality: N/A;   JOINT REPLACEMENT     KNEE ARTHROSCOPY     LUMBAR WOUND DEBRIDEMENT N/A 03/30/2021   Procedure: REPEAT LUMBAR WOUND DEBRIDEMENT;  Surgeon: [name], MD;  Location: [place];  Service: Orthopedics;  Laterality: N/A;   right rotator cuff     ROTATOR CUFF REPAIR Left    TOTAL KNEE ARTHROPLASTY Left 09/08/2020   Procedure: LEFT TOTAL KNEE ARTHROPLASTY;  Surgeon: Leandrew Koyanagi, MD;  Location: [place];  Service: Orthopedics;  Laterality: Left;   TUBAL LIGATION     Patient Active Problem List   Diagnosis Date Noted   Osteopenia 03/03/2022   Acute on chronic renal insufficiency 03/27/2021    Postoperative complication of skin involving drainage from surgical wound 03/25/2021   Post op infection 03/24/2021   S/P lumbar fusion 03/24/2021   Lumbar stenosis 03/09/2021   Degenerative spondylolisthesis 02/19/2021   Primary osteoarthritis of left knee 09/08/2020   DJD (degenerative joint disease) of knee 09/08/2020   Status post total left knee replacement 09/08/2020   Spondylosis without myelopathy or radiculopathy, lumbar region 07/17/2020   Chronic pain syndrome 07/17/2020   Myalgia 07/17/2020   OSA (obstructive sleep apnea) 05/19/2020   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/17/2020   Sleep disturbance 04/17/2020   Chronic pain of both shoulders 03/03/2020   Pain in both hands 03/03/2020   Primary osteoarthritis of both knees 03/03/2020   Pain in both feet 03/03/2020   History of chronic kidney disease 03/03/2020   DDD (degenerative disc disease), lumbar 02/05/2020   Vitamin D deficiency 01/09/2020   Hyperlipidemia 01/09/2020   CKD (chronic kidney disease) stage 3, GFR 30-59 ml/min ([place]) 01/09/2020   HTN (hypertension)    Morbid obesity ([place])    Gout    Fibromyalgia    Depression    Insomnia     PCP: Isaac Bliss, [name], MD  REFERRING PROVIDER: Leeroy Cha  P, MD  REFERRING DIAG: M25.561,G89.29 (ICD-10-CM) - Chronic pain of right knee  THERAPY DIAG:  Chronic pain of right knee  Muscle weakness-general  Other low back pain  Fibromyalgia  Rationale for Evaluation and Treatment Rehabilitation  ONSET DATE: Since 2018   IMAGING 01/22/22  IMPRESSION: 1. Subacute healing right anterolateral ninth rib fracture. 2. No acute intrathoracic process.  SUBJECTIVE STATEMENT: Current: "my knee is good.  No pain.  Its my right side and left foot that is bothering me but what I came here for is better"   Left TKR on 09/04/20 ago with good results, Dec 2022 back surgery (Lumbar fusion) with complication of infection, on prophylactic dose of  antibiotics x 1 year as per infectious disease will end in 4/24 then is planning on R TKA.  Pt reports residual weakness from back infection ue and core.  Peripheral neuropathies right foot (numbness, pain)residual from lumbar fusion. Fibromyalgia x 5 years  FUNCTIONAL IMPROVEMENTS:  Sit/stands, less pain with taking 1st step, stairs.  Pt doesn't have to use cane with cooking.  Pt states she doesn't have knee pain in the AM.  Walking to mailbox and back though does have to sit down when getting back.  FUNCTIONAL LIMITATIONS: difficulty sleeping due to knee pain at night though is better.  Pt has to frequently sit while cooking.  Pt has difficulty with squatting and bending to get objects out of the oven.  Requires assistance when walking around her property.  Standing duration limited, gardening.   CURRENT SUBJECTIVE Pt reports right knee is good. Ankle and rib pain high 8-9/10. Pt with antalgic gait entering pool.  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 Yes:  NPRS scale:  R Knee: 0/10 current, 10/10 worst, 0/10 best R 9th rib area: 8.5/10 R hip current and best, 6/10 worst  Lumbar:  8-9/10 current, 9/10 worst, 2/10 best L medial foot:  0/10 Pain location: see above Pain description: ache Aggravating factors: movement or stillness Relieving factors: hydrocodone    PRECAUTIONS: Knee and Back  WEIGHT BEARING RESTRICTIONS No  FALLS:  Has patient fallen in last 6 months? 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  To be able to garden without cane   OBJECTIVE:   DIAGNOSTIC FINDINGS:  02/10/2022: L ankle x rays: no  acute or structural abnormalities.  There is a small calcific density just distal to the tip of the medial malleolus.    Lumbar Spine x rays: Status post instrumented lumbar fusion.  No acute or structural abnormalities.    XR Lumbar spine: Impression: Status post single level L4-5 instrumented fusion satisfactory  radiographs. XR right knee: Moderately advanced tricompartmental DJD.   TODAY'S TREATMENT:  Aquatic therapy: Pt seen for aquatic therapy today.  Treatment took place in water 3.5-4.75 ft in depth at the [facility] pool. Temp of water was 92.  Pt entered/exited the pool independently via stairs using alternating pattern with bilat hand rail  Pt ambulated fwd and bwd and sidestepped at 3 ft 6 inch to 4 ft depth without UE support   *Walking then jogging forward, back  and side stepping multiple widths  *Stretching ue support on wall: "L", QL  *grapevine with ue support on hand buoys. Cues for execution; increased speed * hand bell swings with le staggered frontal and sagittal planes x 15s in eac position. Punching 2x 15s for core engagement. Cues for execution and "soft knees"    *Squats x10 ue support hand buoys  *curtsy squats x 10  *forward lunges x10; backward lunge x 10 ue support hand   *walking forward and back with kb extended for resistance increasing speed as able *cycling on noodle    Pt requires the buoyancy and hydrostatic pressure of water for support, and to offload joints by unweighting joint load by at least 50 % in navel deep water and by at least 75-80% in chest to neck deep water.  Viscosity of the water is needed for resistance of strengthening. Water current perturbations provides challenge to standing balance requiring increased core activation.  PATIENT EDUCATION:  Education details:  exercise form, exercise rationale, and POC Person educated: Patient Education method: Explanation, demonstration, verbal cues Education comprehension:  verbalized understanding, returned demonstration, verbal cues required   HOME EXERCISE PROGRAM: From previous episode  ASSESSMENT:  CLINICAL IMPRESSION: Added aerobic capacity element to tasks today. Continued progressing strengthening, proprioception and kinesthetic focus. Min cueing and demonstration required for execution of tasks.  No pain with session.  She will be ready for dc end of month. Will begin putting Hep together.  She has plans to join [employer].       OBJECTIVE IMPAIRMENTS Abnormal gait, decreased activity tolerance, decreased balance, decreased mobility, difficulty walking, decreased strength, and pain.   ACTIVITY LIMITATIONS lifting, bending, squatting, stairs, transfers, bed mobility, and locomotion level  PARTICIPATION LIMITATIONS: cleaning, shopping, community activity, and yard work  [place], Past/current experiences, and 1-2 comorbidities: Lumbar dysfunction, OA,   are also affecting patient's functional outcome.   REHAB POTENTIAL: Good  CLINICAL DECISION MAKING: Evolving/moderate complexity  EVALUATION COMPLEXITY: Moderate   GOALS: Goals reviewed with patient? Yes  SHORT TERM GOALS: Target date: 01/21/22 Pt will initiate access to pool/gym in order to be compliant with final HEP land and aquatic based Baseline:no access Goal status: Ongoing  2.  Pt to be ale to tolerate amb up towards 15 mins to demonstrate improved toleration to activity Baseline: 7 Goal status: Ongoing  3.  Pt will improve on Berg balance test to 40/46 Baseline: 31/56 Goal status: Not assessed  4.  Pt will improve on Right hip and knee flex to 1/2  grade without pain with resistance Baseline: 4- to 4/5 Goal status: ongoing  LONG TERM GOALS: Target date:03/22/2022  Pt to meet Foto goal of 49% Baseline: 36; currently 41 Goal status: Progressing  2.  Pt to be indep with final HEP Baseline:  Goal status: ongoing  3.  Pt will improve on 30s STS test to 7 or> to  demonstrate improved le strength Baseline: 3.5 Goal status: Goal met  4.  Pt will improve in bilat shoulder str up to or > 1 full grade Baseline: 3- to 3+/5 Goal status: not tested due to R rib fracture     PLAN: PT FREQUENCY: 1-2x/week  PT DURATION: 6 weeks  PLANNED INTERVENTIONS: Therapeutic exercises, Therapeutic activity, Neuromuscular re-education, Balance training, Gait training, Patient/Family education, Self Care, Joint mobilization, Joint manipulation, Stair training, Aquatic Therapy, Dry Needling, Cryotherapy, Moist heat, Taping, Ultrasound, Manual therapy, and Re-evaluation  PLAN FOR NEXT SESSION:   Cont with aquatic therapy and monitor  sx's of L foot and R rib.    Stanton Kidney Tharon Aquas) Lynnwood Beckford MPT 03/03/22 1:09 PM

## 2022-03-04 ENCOUNTER — Other Ambulatory Visit: Payer: Self-pay | Admitting: Internal Medicine

## 2022-03-04 NOTE — Telephone Encounter (Signed)
Please advise if okay to refill. 

## 2022-03-05 ENCOUNTER — Telehealth: Payer: Self-pay | Admitting: *Deleted

## 2022-03-05 NOTE — Telephone Encounter (Signed)
Urine drug screen for this encounter is consistent for prescribed medication 

## 2022-03-08 ENCOUNTER — Encounter (HOSPITAL_BASED_OUTPATIENT_CLINIC_OR_DEPARTMENT_OTHER): Payer: Self-pay | Admitting: Physical Therapy

## 2022-03-08 ENCOUNTER — Ambulatory Visit (HOSPITAL_BASED_OUTPATIENT_CLINIC_OR_DEPARTMENT_OTHER): Payer: Commercial Managed Care - PPO | Admitting: Physical Therapy

## 2022-03-08 DIAGNOSIS — G8929 Other chronic pain: Secondary | ICD-10-CM

## 2022-03-08 DIAGNOSIS — M5459 Other low back pain: Secondary | ICD-10-CM

## 2022-03-08 DIAGNOSIS — M6281 Muscle weakness (generalized): Secondary | ICD-10-CM

## 2022-03-08 DIAGNOSIS — M25561 Pain in right knee: Secondary | ICD-10-CM | POA: Diagnosis not present

## 2022-03-08 NOTE — Therapy (Signed)
OUTPATIENT PHYSICAL THERAPY LOWER EXTREMITY TREATMENT    Patient Name: Amy Hooper MRN: [id number] DOB:12-14-1950, 71 y.o., female Today's Date: 03/08/2022   PT End of Session - 03/08/22 1100     Visit Number 16    Number of Visits 22    Date for PT Re-Evaluation 03/22/22    Authorization Type UHC    PT Start Time 0900    PT Stop Time 0941    PT Time Calculation (min) 41 min    Activity Tolerance Patient tolerated treatment well    Behavior During Therapy WFL for tasks assessed/performed                  Past Medical History:  Diagnosis Date   Chronic kidney disease    Depression    Fibromyalgia    Gout    HTN (hypertension)    Insomnia    Morbid obesity ([place])    Osteoarthritis    Palpitation    Sleep apnea    Past Surgical History:  Procedure Laterality Date   APPLICATION OF WOUND VAC N/A 03/25/2021   Procedure: APPLICATION OF WOUND VAC;  Surgeon: [name], MD;  Location: [place];  Service: Orthopedics;  Laterality: N/A;   APPLICATION OF WOUND VAC N/A 03/30/2021   Procedure: WOUND VAC CHANGE 12x6x5;  Surgeon: [name], MD;  Location: [place];  Service: Orthopedics;  Laterality: N/A;   INCISION AND DRAINAGE OF WOUND N/A 03/25/2021   Procedure: LUMBAR POST OP INCISION IRRIGATION;  Surgeon: [name], MD;  Location: [place];  Service: Orthopedics;  Laterality: N/A;   JOINT REPLACEMENT     KNEE ARTHROSCOPY     LUMBAR WOUND DEBRIDEMENT N/A 03/30/2021   Procedure: REPEAT LUMBAR WOUND DEBRIDEMENT;  Surgeon: [name], MD;  Location: [place];  Service: Orthopedics;  Laterality: N/A;   right rotator cuff     ROTATOR CUFF REPAIR Left    TOTAL KNEE ARTHROPLASTY Left 09/08/2020   Procedure: LEFT TOTAL KNEE ARTHROPLASTY;  Surgeon: Leandrew Koyanagi, MD;  Location: [place];  Service: Orthopedics;  Laterality: Left;   TUBAL LIGATION     Patient Active Problem List   Diagnosis Date Noted   Osteopenia 03/03/2022   Acute on chronic renal insufficiency 03/27/2021    Postoperative complication of skin involving drainage from surgical wound 03/25/2021   Post op infection 03/24/2021   S/P lumbar fusion 03/24/2021   Lumbar stenosis 03/09/2021   Degenerative spondylolisthesis 02/19/2021   Primary osteoarthritis of left knee 09/08/2020   DJD (degenerative joint disease) of knee 09/08/2020   Status post total left knee replacement 09/08/2020   Spondylosis without myelopathy or radiculopathy, lumbar region 07/17/2020   Chronic pain syndrome 07/17/2020   Myalgia 07/17/2020   OSA (obstructive sleep apnea) 05/19/2020   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/17/2020   Sleep disturbance 04/17/2020   Chronic pain of both shoulders 03/03/2020   Pain in both hands 03/03/2020   Primary osteoarthritis of both knees 03/03/2020   Pain in both feet 03/03/2020   History of chronic kidney disease 03/03/2020   DDD (degenerative disc disease), lumbar 02/05/2020   Vitamin D deficiency 01/09/2020   Hyperlipidemia 01/09/2020   CKD (chronic kidney disease) stage 3, GFR 30-59 ml/min ([place]) 01/09/2020   HTN (hypertension)    Morbid obesity ([place])    Gout    Fibromyalgia    Depression    Insomnia     PCP: Isaac Bliss, [name], MD  REFERRING PROVIDER: Ranell Patrick,  Clide Deutscher, MD  REFERRING DIAG: [phone] (ICD-10-CM) - Chronic pain of right knee  THERAPY DIAG:  Chronic pain of right knee  Muscle weakness-general  Other low back pain  Rationale for Evaluation and Treatment Rehabilitation  ONSET DATE: Since 2018   IMAGING 01/22/22  IMPRESSION: 1. Subacute healing right anterolateral ninth rib fracture. 2. No acute intrathoracic process.  SUBJECTIVE STATEMENT: "I was so excited and felt so good in the pool last session, but it really aggravated my (R) knee and feet".     FUNCTIONAL IMPROVEMENTS:  Sit/stands, less pain with taking 1st step, stairs.  Pt doesn't have to use cane with cooking.  Pt states she doesn't have knee pain in the AM.   Walking to mailbox and back though does have to sit down when getting back.  FUNCTIONAL LIMITATIONS: difficulty sleeping due to knee pain at night though is better.  Pt has to frequently sit while cooking.  Pt has difficulty with squatting and bending to get objects out of the oven.  Requires assistance when walking around her property.  Standing duration limited, gardening.     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   See above    PAIN:  Are you having pain? Yes:  NPRS scale:  R Knee: 7/10 current R 9th rib area: 0/10   Lumbar:  3/10 current, L medial foot:  8/10 Pain location: see above Pain description: ache Aggravating factors: movement or stillness Relieving factors: hydrocodone    PRECAUTIONS: Knee and Back  WEIGHT BEARING RESTRICTIONS No  FALLS:  Has patient fallen in last 6 months? 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  To be able to garden without cane   OBJECTIVE:   DIAGNOSTIC FINDINGS:  02/10/2022: L ankle x rays: no acute or structural abnormalities.  There is a small calcific density just distal to the tip of the medial malleolus.    Lumbar Spine x rays: Status post instrumented lumbar fusion.  No acute or structural abnormalities.    XR Lumbar spine: Impression: Status post single level L4-5 instrumented fusion satisfactory  radiographs. XR right knee: Moderately advanced tricompartmental DJD.   TODAY'S TREATMENT:  Pt seen for aquatic therapy today.  Treatment took place in water 3.5-4.75 ft in depth at the [facility] pool. Temp of water was 92.  Pt entered/exited the pool independently via stairs using alternating  pattern with bilat hand rail   *Warm up:  fwd / bwd and sidestepping without UE support  * grapevine with UE support on yellow hand floats; repeated with arms crossing without floatation. Cues for execution  * high knee marching with opp knee tap with rainbow hand float  * alternating backward lunge with hands on kickboard x 10;  side lunge and return to neutral with single hand on kick board  * squat - attempted to push yellow hand float under water; switched to holding it on surface x 12 (preferred)   *Stretching UE support on wall: "L" with wag the tail  * SLS with hands on single yellow float 15s, then repeated with hands in water  * return to walking forward/backward with increased speed  * suspended on noodle in deeper  water without UE support: cycling (cues for even LE use), reverse jumping jacks, and cc ski  Pt requires the buoyancy and hydrostatic pressure of water for support, and to offload joints by unweighting joint load by at least 50 % in navel deep water and by at least 75-80% in chest to neck deep water.  Viscosity of the water is needed for resistance of strengthening. Water current perturbations provides challenge to standing balance requiring increased core activation.  PATIENT EDUCATION:  Education details:  exercise form, exercise rationale, and POC Person educated: Patient Education method: Explanation, demonstration, verbal cues Education comprehension: verbalized understanding, returned demonstration, verbal cues required   HOME EXERCISE PROGRAM:  (not issued yet)  Access Code: [email or web address] URL: [email or web address] Date: 03/08/2022 Prepared by: Parks  Exercises - Side Stepping with Hand Floats  - 1 x daily - 7 x weekly - 10 reps - Braided Sidestepping  - 1 x daily - 7 x weekly - 10 reps - Walking March  - 1 x daily - 7 x weekly - 10 reps - Squat  - 1 x daily - 7 x weekly - 3 sets - 10 reps - Backward Lunge with  Forward Reach with Kickboard  - 1 x daily - 3 x weekly - 1-2 sets - 10 reps - Upright Side Lunge  - 1 x daily - 3 x weekly - 1-2 sets - 10 reps - Standing 'L' Stretch at Counter  - 1 x daily - 7 x weekly - 2 reps - 20 seconds  hold - Single Leg Stance  - 1 x daily - 7 x weekly - 1 sets - 2-3 reps - 15-30 seconds  hold  ASSESSMENT:  CLINICAL IMPRESSION: Encouraged pt to trial some exercises without UE support, to challenge body to learn new movement patterns to assist with improving dynamic / static balance on land.  Overall, she reported reduction of pain to 2/10 in Lt foot and 0/10 in Rt knee 30 minutes into session.  Will continue modifying HEP prior to issuing. Therapist to test Merrilee Jansky next visit.       OBJECTIVE IMPAIRMENTS Abnormal gait, decreased activity tolerance, decreased balance, decreased mobility, difficulty walking, decreased strength, and pain.   ACTIVITY LIMITATIONS lifting, bending, squatting, stairs, transfers, bed mobility, and locomotion level  PARTICIPATION LIMITATIONS: cleaning, shopping, community activity, and yard work  [place], Past/current experiences, and 1-2 comorbidities: Lumbar dysfunction, OA,   are also affecting patient's functional outcome.   REHAB POTENTIAL: Good  CLINICAL DECISION MAKING: Evolving/moderate complexity  EVALUATION COMPLEXITY: Moderate   GOALS: Goals reviewed with patient? Yes  SHORT TERM GOALS: Target date: 01/21/22 Pt will initiate access to pool/gym in order to be compliant with final HEP land and aquatic based Baseline:no access Goal status: Ongoing  2.  Pt to be ale to tolerate amb up towards 15 mins to demonstrate improved toleration to activity Baseline: 7 min Goal status: Ongoing   3.  Pt will improve on Berg balance test to 40/46 Baseline: 31/56 Goal status: Not assessed  4.  Pt will improve on Right hip and knee flex to 1/2  grade without pain with resistance Baseline: 4- to 4/5 Goal status:  ongoing  LONG TERM GOALS: Target date:03/22/2022  Pt to meet Foto goal of 49% Baseline: 36; currently 41 Goal status: Progressing  2.  Pt to be indep with final HEP Baseline:  Goal status: ongoing  3.  Pt will improve on 30s STS test to  7 or> to demonstrate improved le strength Baseline:  Goal status: Goal met  4.  Pt will improve in bilat shoulder strength up to or > 1 full grade Baseline: 3- to 3+/5 Goal status: not tested due to R rib fracture     PLAN: PT FREQUENCY: 1-2x/week  PT DURATION: 6 weeks  PLANNED INTERVENTIONS: Therapeutic exercises, Therapeutic activity, Neuromuscular re-education, Balance training, Gait training, Patient/Family education, Self Care, Joint mobilization, Joint manipulation, Stair training, Aquatic Therapy, Dry Needling, Cryotherapy, Moist heat, Taping, Ultrasound, Manual therapy, and Re-evaluation  PLAN FOR NEXT SESSION:   Cont with aquatic therapy and monitor sx's of L foot and R rib. Prepare for d/c.   Kerin Perna, PTA 03/08/22 11:00 AM [place], [place], [address] Phone: [phone]   Fax:  [phone]

## 2022-03-11 ENCOUNTER — Ambulatory Visit (HOSPITAL_BASED_OUTPATIENT_CLINIC_OR_DEPARTMENT_OTHER): Payer: Commercial Managed Care - PPO | Admitting: Physical Therapy

## 2022-03-12 ENCOUNTER — Encounter (HOSPITAL_BASED_OUTPATIENT_CLINIC_OR_DEPARTMENT_OTHER): Payer: Self-pay | Admitting: Physical Therapy

## 2022-03-12 ENCOUNTER — Encounter: Payer: Self-pay | Admitting: Orthopaedic Surgery

## 2022-03-12 ENCOUNTER — Other Ambulatory Visit: Payer: Self-pay

## 2022-03-12 DIAGNOSIS — M25572 Pain in left ankle and joints of left foot: Secondary | ICD-10-CM

## 2022-03-12 NOTE — Telephone Encounter (Signed)
Ankle please.  Thanks.

## 2022-03-14 NOTE — Therapy (Signed)
OUTPATIENT PHYSICAL THERAPY LOWER EXTREMITY TREATMENT / DISCHARGE NOTE   Patient Name: Amy Hooper MRN: [id number] DOB:20-Apr-1950, 71 y.o., female Today's Date: 03/16/2022   PT End of Session - 03/16/22 0852     Visit Number 17    Number of Visits 22    Date for PT Re-Evaluation 03/22/22    Authorization Type [employer]    PT Start Time 0806    PT Stop Time 0852    PT Time Calculation (min) 46 min    Activity Tolerance Patient tolerated treatment well    Behavior During Therapy [facility] for tasks assessed/performed                   Past Medical History:  Diagnosis Date   Chronic kidney disease    Depression    Fibromyalgia    Gout    HTN (hypertension)    Insomnia    Morbid obesity ([place])    Osteoarthritis    Palpitation    Sleep apnea    Past Surgical History:  Procedure Laterality Date   APPLICATION OF WOUND VAC N/A 03/25/2021   Procedure: APPLICATION OF WOUND VAC;  Surgeon: [name], MD;  Location: [place];  Service: Orthopedics;  Laterality: N/A;   APPLICATION OF WOUND VAC N/A 03/30/2021   Procedure: WOUND VAC CHANGE 12x6x5;  Surgeon: [name], MD;  Location: [place];  Service: Orthopedics;  Laterality: N/A;   INCISION AND DRAINAGE OF WOUND N/A 03/25/2021   Procedure: LUMBAR POST OP INCISION IRRIGATION;  Surgeon: [name], MD;  Location: [place];  Service: Orthopedics;  Laterality: N/A;   JOINT REPLACEMENT     KNEE ARTHROSCOPY     LUMBAR WOUND DEBRIDEMENT N/A 03/30/2021   Procedure: REPEAT LUMBAR WOUND DEBRIDEMENT;  Surgeon: [name], MD;  Location: [place];  Service: Orthopedics;  Laterality: N/A;   right rotator cuff     ROTATOR CUFF REPAIR Left    TOTAL KNEE ARTHROPLASTY Left 09/08/2020   Procedure: LEFT TOTAL KNEE ARTHROPLASTY;  Surgeon: Leandrew Koyanagi, MD;  Location: [place];  Service: Orthopedics;  Laterality: Left;   TUBAL LIGATION     Patient Active Problem List   Diagnosis Date Noted   Osteopenia 03/03/2022   Acute on chronic renal  insufficiency 03/27/2021   Postoperative complication of skin involving drainage from surgical wound 03/25/2021   Post op infection 03/24/2021   S/P lumbar fusion 03/24/2021   Lumbar stenosis 03/09/2021   Degenerative spondylolisthesis 02/19/2021   Primary osteoarthritis of left knee 09/08/2020   DJD (degenerative joint disease) of knee 09/08/2020   Status post total left knee replacement 09/08/2020   Spondylosis without myelopathy or radiculopathy, lumbar region 07/17/2020   Chronic pain syndrome 07/17/2020   Myalgia 07/17/2020   OSA (obstructive sleep apnea) 05/19/2020   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/17/2020   Sleep disturbance 04/17/2020   Chronic pain of both shoulders 03/03/2020   Pain in both hands 03/03/2020   Primary osteoarthritis of both knees 03/03/2020   Pain in both feet 03/03/2020   History of chronic kidney disease 03/03/2020   DDD (degenerative disc disease), lumbar 02/05/2020   Vitamin D deficiency 01/09/2020   Hyperlipidemia 01/09/2020   CKD (chronic kidney disease) stage 3, GFR 30-59 ml/min ([place]) 01/09/2020   HTN (hypertension)    Morbid obesity ([place])    Gout    Fibromyalgia    Depression    Insomnia     PCP: Isaac Bliss, [name], MD  REFERRING PROVIDER: Izora Ribas, MD  REFERRING DIAG: [phone] (ICD-10-CM) - Chronic pain of right knee  THERAPY DIAG:  Chronic pain of right knee  Muscle weakness-general  Other low back pain  Rationale for Evaluation and Treatment Rehabilitation  ONSET DATE: Since 2018   IMAGING 01/22/22  IMPRESSION: 1. Subacute healing right anterolateral ninth rib fracture. 2. No acute intrathoracic process.  SUBJECTIVE STATEMENT: Pt reports her R knee has "gotten much better since starting aqua therapy".  Pt states her foot is really bothering her and is getting worse.  MD ordered a MRI for ankle/foot.  Pt had increased pain in L foot and R knee after prior Rx.  Pt states even though  it's low impact it still put stress on her last Rx.  Pt states when she gets the ok which will probably be in March/April, she plans to have knee replacement. Pt states her insurance is changing and she plans to begin silver sneakers at the beginning of the year.  Pt uses her cane with ambulation.  Pt ambulated 10 mins yesterday and reports having increased L foot pain though her knee did well.   FUNCTIONAL IMPROVEMENTS:  reduced fear and improved balance and stability.  Ascending stairs.  Longer duration and improved strength with household chores.    FUNCTIONAL LIMITATIONS: difficulty sleeping due to knee pain at night though is better.  Foot limits her more with ambulation Pt has difficulty with squatting and bending to get objects out of the oven.  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   See above    PAIN:  Are you having pain? Yes:  NPRS scale:  R Knee: 4/10 current L medial foot:  9/10     PRECAUTIONS: Knee and Back  WEIGHT BEARING RESTRICTIONS No  FALLS:  Has patient fallen in last 6 months? 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  To be able to garden without cane   OBJECTIVE:   DIAGNOSTIC FINDINGS:  02/10/2022: L ankle x rays: no acute or structural abnormalities.  There is a small calcific density just distal to the tip of the medial malleolus.    Lumbar Spine x rays: Status post instrumented lumbar fusion.  No acute or structural abnormalities.    XR Lumbar spine: Impression: Status post single level L4-5 instrumented fusion satisfactory  radiographs. XR right knee: Moderately advanced  tricompartmental DJD.   TODAY'S TREATMENT:  -Reviewed current function, pain level, and response to prior Rx. -Review Aquatic therapy program.    -PATIENT SURVEYS:  FOTO prior / Current:  41 / 58 with goal of 49    Pt seen for aquatic therapy today.  Treatment took place in water 3.5-4.75 ft in depth at the [facility] pool. Temp of water was 92.  Pt entered/exited the pool independently via stairs using alternating pattern with bilat hand rail   *Warm up:  fwd / bwd and sidestepping without UE support  * grapevine with UE support on yellow hand floats x 2 laps and without yellow hand floats x 1 lap.  Cues for execution  * high knee marching x 1 lap   *  alternating backward lunge with hands on kickboard approx 12-15; side lunge and return to neutral with single hand on kick board  * squat 2x10  * SLS with hands on single yellow float 15s, then repeated with hands in water  * suspended on noodle in deeper water with and without UE support: cycling.  Reviewed cc ski and scissors  Pt requires the buoyancy and hydrostatic pressure of water for support, and to offload joints by unweighting joint load by at least 50 % in navel deep water and by at least 75-80% in chest to neck deep water.  Viscosity of the water is needed for resistance of strengthening. Water current perturbations provides challenge to standing balance requiring increased core activation.  PATIENT EDUCATION:  Education details:  exercise form and POC.  PT answered questions.  Aquatic HEP.   Person educated: Patient Education method: Explanation, demonstration, verbal cues, handout Education comprehension: verbalized understanding, returned demonstration, verbal cues required   HOME EXERCISE PROGRAM: Access Code: [email or web address] URL: [email or web address] Date: 03/08/2022 Prepared by: Odin  Exercises - Side Stepping with Hand Floats  - 1 x daily - 7 x weekly - 10  reps - Braided Sidestepping  - 1 x daily - 7 x weekly - 10 reps - Walking March  - 1 x daily - 7 x weekly - 10 reps - Squat  - 1 x daily - 7 x weekly - 3 sets - 10 reps - Backward Lunge with Forward Reach with Kickboard  - 1 x daily - 3 x weekly - 1-2 sets - 10 reps - Upright Side Lunge  - 1 x daily - 3 x weekly - 1-2 sets - 10 reps - Standing 'L' Stretch at Counter  - 1 x daily - 7 x weekly - 2 reps - 20 seconds  hold - Single Leg Stance  - 1 x daily - 7 x weekly - 1 sets - 2-3 reps - 15-30 seconds  hold  ASSESSMENT:  CLINICAL IMPRESSION: Pt has progressed well in PT having improved function, pain, and tolerance to activity relating to her R knee.  She has progressed well with aquatic exercises and states aquatic therapy has helped her knee greatly.  She continues to have L foot pain which has limits her ambulation and mobility.  She is getting a MRI for her L foot.  PT gave pt aquatic HEP handout and extensively went through her aquatic HEP instructing pt in correct form, appropriate frequency/sets/reps, appropriate UE support, and progression.  Pt performed aquatic exercises well and is independent with aquatic program.  Pt did have some L foot pain with exercises and pt was instructed to hold off on exercises that increase L foot pain.  She had no c/o's in her R knee with aquatic program.  Pt demonstrates improved self perceived disability with FOTO improving from 41 to 58.  Pt met her FOTO goal.  Pt met LTG's #1,2.  She met LTG #3 prior.  Pt responded well to Rx having no c/o's after Rx.  Pt is ready for discharge.         OBJECTIVE IMPAIRMENTS Abnormal gait, decreased activity tolerance, decreased balance, decreased mobility, difficulty walking, decreased strength, and pain.   ACTIVITY LIMITATIONS lifting, bending, squatting, stairs, transfers, bed mobility, and locomotion level  PARTICIPATION LIMITATIONS: cleaning, shopping, community activity, and yard work  [place],  Past/current experiences, and 1-2 comorbidities: Lumbar dysfunction, OA,   are also affecting patient's functional outcome.  REHAB POTENTIAL: Good  CLINICAL DECISION MAKING: Evolving/moderate complexity  EVALUATION COMPLEXITY: Moderate   GOALS: Goals reviewed with patient? Yes  SHORT TERM GOALS: Target date: 01/21/22 Pt will initiate access to pool/gym in order to be compliant with final HEP land and aquatic based Baseline:no access Goal status: Ongoing  2.  Pt to be ale to tolerate amb up towards 15 mins to demonstrate improved toleration to activity Baseline: 10 min Goal status: partially met  3.  Pt will improve on Berg balance test to 40/46 Baseline: 31/56 Goal status: Not assessed  4.  Pt will improve on Right hip and knee flex to 1/2  grade without pain with resistance Baseline: 4- to 4/5 Goal status: ongoing; not tested today  LONG TERM GOALS: Target date:03/22/2022  Pt to meet Foto goal of 49% Baseline: 36; currently 41 Goal status: goal met  2.  Pt to be indep with final HEP Baseline:  Goal status: goal met  3.  Pt will improve on 30s STS test to 7 or> to demonstrate improved le strength Baseline:  Goal status: Goal met  4.  Pt will improve in bilat shoulder strength up to or > 1 full grade Baseline: 3- to 3+/5 Goal status: not tested today     PLAN:  PLANNED INTERVENTIONS: Therapeutic exercises, Therapeutic activity, Neuromuscular re-education, Balance training, Gait training, Patient/Family education, Self Care, Joint mobilization, Joint manipulation, Stair training, Aquatic Therapy, Dry Needling, Cryotherapy, Moist heat, Taping, Ultrasound, Manual therapy, and Re-evaluation  PLAN FOR NEXT SESSION:   Pt to be discharged from skilled PT services due to reaching maximum functional level at this time and being independent with aquatic program.  She will cont with aquatic HEP and plans to do Silver Sneakers at the first of the year.  Pt plans to perform  aquatic exercises at the [facility] center.      PHYSICAL THERAPY DISCHARGE SUMMARY  Visits from Start of Care: 17  Current functional level related to goals / functional outcomes: See above   Remaining deficits: See above   Education / Equipment: Pt has a HEP.     Selinda Michaels III PT, DPT 03/16/22 9:30 AM

## 2022-03-16 ENCOUNTER — Encounter (HOSPITAL_BASED_OUTPATIENT_CLINIC_OR_DEPARTMENT_OTHER): Payer: Self-pay | Admitting: Physical Therapy

## 2022-03-16 ENCOUNTER — Ambulatory Visit (HOSPITAL_BASED_OUTPATIENT_CLINIC_OR_DEPARTMENT_OTHER): Payer: Commercial Managed Care - PPO | Admitting: Physical Therapy

## 2022-03-16 DIAGNOSIS — M6281 Muscle weakness (generalized): Secondary | ICD-10-CM

## 2022-03-16 DIAGNOSIS — M5459 Other low back pain: Secondary | ICD-10-CM

## 2022-03-16 DIAGNOSIS — G8929 Other chronic pain: Secondary | ICD-10-CM

## 2022-03-16 DIAGNOSIS — M25561 Pain in right knee: Secondary | ICD-10-CM | POA: Diagnosis not present

## 2022-03-17 ENCOUNTER — Ambulatory Visit (HOSPITAL_BASED_OUTPATIENT_CLINIC_OR_DEPARTMENT_OTHER): Payer: Commercial Managed Care - PPO | Admitting: Physical Therapy

## 2022-03-17 ENCOUNTER — Encounter: Payer: Self-pay | Admitting: Orthopaedic Surgery

## 2022-03-19 ENCOUNTER — Ambulatory Visit (HOSPITAL_BASED_OUTPATIENT_CLINIC_OR_DEPARTMENT_OTHER): Payer: Commercial Managed Care - PPO | Admitting: Physical Therapy

## 2022-03-23 ENCOUNTER — Ambulatory Visit (HOSPITAL_BASED_OUTPATIENT_CLINIC_OR_DEPARTMENT_OTHER): Payer: Commercial Managed Care - PPO | Admitting: Physical Therapy

## 2022-03-23 ENCOUNTER — Other Ambulatory Visit: Payer: Self-pay | Admitting: Physician Assistant

## 2022-03-23 MED ORDER — DIAZEPAM 2 MG PO TABS: ORAL_TABLET | ORAL | 0 refills | Status: DC

## 2022-03-23 NOTE — Telephone Encounter (Signed)
Sent in

## 2022-03-25 ENCOUNTER — Ambulatory Visit (HOSPITAL_BASED_OUTPATIENT_CLINIC_OR_DEPARTMENT_OTHER): Payer: Commercial Managed Care - PPO | Admitting: Physical Therapy

## 2022-03-25 ENCOUNTER — Encounter: Payer: 59 | Attending: Psychology | Admitting: Psychology

## 2022-03-25 DIAGNOSIS — G8929 Other chronic pain: Secondary | ICD-10-CM

## 2022-03-25 DIAGNOSIS — G4733 Obstructive sleep apnea (adult) (pediatric): Secondary | ICD-10-CM

## 2022-03-25 DIAGNOSIS — G894 Chronic pain syndrome: Secondary | ICD-10-CM | POA: Diagnosis not present

## 2022-03-25 DIAGNOSIS — M5136 Other intervertebral disc degeneration, lumbar region: Secondary | ICD-10-CM

## 2022-03-25 DIAGNOSIS — F321 Major depressive disorder, single episode, moderate: Secondary | ICD-10-CM

## 2022-03-25 DIAGNOSIS — M797 Fibromyalgia: Secondary | ICD-10-CM | POA: Diagnosis not present

## 2022-03-25 DIAGNOSIS — F320 Major depressive disorder, single episode, mild: Secondary | ICD-10-CM | POA: Diagnosis present

## 2022-03-25 DIAGNOSIS — M545 Low back pain, unspecified: Secondary | ICD-10-CM | POA: Diagnosis not present

## 2022-03-25 DIAGNOSIS — G629 Polyneuropathy, unspecified: Secondary | ICD-10-CM | POA: Insufficient documentation

## 2022-03-26 ENCOUNTER — Encounter: Payer: Self-pay | Admitting: Internal Medicine

## 2022-03-29 ENCOUNTER — Ambulatory Visit (HOSPITAL_BASED_OUTPATIENT_CLINIC_OR_DEPARTMENT_OTHER): Payer: Commercial Managed Care - PPO | Admitting: Physical Therapy

## 2022-03-30 ENCOUNTER — Encounter: Payer: Self-pay | Admitting: Physical Medicine and Rehabilitation

## 2022-03-30 ENCOUNTER — Encounter: Payer: 59 | Admitting: Physical Medicine and Rehabilitation

## 2022-03-30 VITALS — BP 147/88 | HR 64 | Ht 62.0 in | Wt 202.0 lb

## 2022-03-30 DIAGNOSIS — F320 Major depressive disorder, single episode, mild: Secondary | ICD-10-CM

## 2022-03-30 DIAGNOSIS — G629 Polyneuropathy, unspecified: Secondary | ICD-10-CM

## 2022-03-30 MED ORDER — ESZOPICLONE 1 MG PO TABS: 1.0000 mg | ORAL_TABLET | Freq: Every evening | ORAL | 3 refills | Status: DC | PRN

## 2022-03-30 NOTE — Progress Notes (Signed)
Subjective:    Patient ID: Amy Hooper, female    DOB: 1951/01/24, 72 y.o.   MRN: [id number]  HPI Female with f/u OSA, morbid obesity, hypertension, fibromyalgia, depression, gout, CKD, degenerative disc disease-lumbar, bilateral knee surgeries for torn mensici, bilateral shoulder surgeries for rotator cuff presents with bilateral L > R knee pain and wound-related pain. She presents for follow-up today regarding her food allergies  1) Wound related pain -she is having severe pain associated with her wound vac dressing changes -she has been taking 2 hydrocodone per day and this allows her to function but she states her surgeon is no longer willing to prescribe for her -she asks about alternative pain medications she can take -she says her surgeon says her wound vac may be able to be removed at the end of March.  She cannot sit too long because this aggravates the pain.   2) Knee pain: -she had relief from synvisc -she has terrible pain during the night -patches and the gel do not help.  Initially stated: Started ~1990. After a fall on her knees.  Progressively getting worse.  Steroid injections improve the pain along with brace.  Ambulation exacerbates the pain.  Moving after prolonged postures exacerbates the pain.  Achy.  Radiates down leg.  Intermittent. Denies associated weakness, numbness.  Tylenol. 1 fall summer of 2021 falling backward in chair.  Pain limits ambulation.  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 She has had bilateral L4-5 lumbar epidural steroid injections.  She has also had facet injections.  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  Irritation of S1 nerve roots.  She had a left knee steroid injection on 03/18/2020 with Ortho.  She is seen rheumatology as well.  No reviewed from rheumatology, orthopedic surgeon, physiatrist,  neurology-plan for knee replacement in December 2022. She does note that she is improving with weight loss and dietary changes.   3) Hand pain -spiked since she has been off Celebrex due to her kidney injury -she was recommended to take Qunidine but she can't take because of the antibiotics which she is on for a year due to her wound in her back.  -percocet does not help with the pain as much as the hydrocodone.   4) AKI -improved since off Celebrex  5) Depression:  -she feels miserable.  -she was started on Cymbalta. This did help with the pain in her legs.  -she is not sure if she needs to see anyone for grief  6) Insomnia: -not sleeping well.  -she cannot sleep without Lunesta and is having an issue with this for her insurance -she also takes Amitriptyline, Lyrica, and Cymbalta- when she takes all these she does not get up until 11am the next day.  7) Food allergies -she asks about the results of her food allergy testing   8) Spinal pain and neuropathy -she weaned herself off the amitriptyline as it made her feel like a zombie and now she is more alert    She saw pain management since that time and is scheduled for MBB.  Since last visit, she states she has had great improvement with pool therapy. She states she is hungry. She had good benefits with Lyrica. She has not seen Nephro yet. She is awaiting CPAP.  She has bad fibromyalgia flares when the weather changes- especially when  it rains. She was started on Neurontin by Dr. Posey Pronto and it does help on a daily basis. She had relief with Celebrex well before but had to stop due to CKD. She has never tried steroids.   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   Situational depression: She is going through changes with her family. They are all living in her house and everyone is arguing. She does not want to get herself worked up due to her hypertension. Her family brings her in a lot. Her husband says why don't you say anything. She felt a car  coming across her yesterday and she screamed. She feels overwhelmed. Her mother may have undiagnosed mental illness. Her mom called th police on her husband.   Severe arthritis and bulging discs: -she has a lot of pain and nothing helps it. If she sits too long it hurts and if she walks to long it hurts -Lyrica and steroids help her pain -she is planing to have surgery.   Weight gain  Not voiding as much as she used to -check Creatinine today.    Pain Inventory Average Pain 9 Pain Right Now 8 My pain is constant, burning, stabbing, and aching, stabbing  In the last 24 hours, has pain interfered with the following? General activity 8 Relation with others 9 Enjoyment of life 10 What TIME of day is your pain at its worst? morning , daytime, and evening Sleep (in general) Fair  Pain is worse with: walking, bending, sitting, standing, and some activites Pain improves with: heat/ice and medication Relief from Meds: 7   Family History  Problem Relation Age of Onset   Hypothyroidism Mother    Hyperlipidemia Other    Kidney failure Father    Dementia Father    Gout Father    Arthritis Father    Prostate cancer Father    Social History   Socioeconomic History   Marital status: Married    Spouse name: Not on file   Number of children: 3   Years of education: Not on file   Highest education level: Bachelor's degree (e.g., BA, AB, BS)  Occupational History   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     Social Determinants of Health   Financial Resource Strain: Low Risk  (03/01/2021)   Overall Financial Resource Strain (CARDIA)     Difficulty of Paying Living Expenses: Not hard at all  Food Insecurity: No Food Insecurity (03/01/2021)   Hunger Vital Sign    Worried About Running Out of Food in the Last Year: Never true    Ran Out of Food in the Last Year: Never true  Transportation Needs: No Transportation Needs (03/01/2021)   PRAPARE - Hydrologist (Medical): No    Lack of Transportation (Non-Medical): No  Physical Activity: Unknown (03/01/2021)   Exercise Vital Sign    Days of Exercise per Week: 0 days    Minutes of Exercise per Session: Not on file  Stress: No Stress Concern Present (03/01/2021)   [place]  Questionnaire    Feeling of Stress : Only a little  Social Connections: Moderately Isolated (03/01/2021)   Social Connection and Isolation Panel [NHANES]    Frequency of Communication with Friends and Family: More than three times a week    Frequency of Social Gatherings with Friends and Family: Once a week    Attends Religious Services: Never    Marine scientist or Organizations: No    Attends Music therapist: Not on file    Marital Status: Married   Past Surgical History:  Procedure Laterality Date   APPLICATION OF WOUND VAC N/A 03/25/2021   Procedure: APPLICATION OF WOUND VAC;  Surgeon: [name], MD;  Location: [place];  Service: Orthopedics;  Laterality: N/A;   APPLICATION OF WOUND VAC N/A 03/30/2021   Procedure: WOUND VAC CHANGE 12x6x5;  Surgeon: [name], MD;  Location: [place];  Service: Orthopedics;  Laterality: N/A;   INCISION AND DRAINAGE OF WOUND N/A 03/25/2021   Procedure: LUMBAR POST OP INCISION IRRIGATION;  Surgeon: [name], MD;  Location: [place];  Service: Orthopedics;  Laterality: N/A;   JOINT REPLACEMENT     KNEE ARTHROSCOPY     LUMBAR WOUND DEBRIDEMENT N/A 03/30/2021   Procedure: REPEAT LUMBAR WOUND DEBRIDEMENT;  Surgeon: [name], MD;  Location: [place];  Service:  Orthopedics;  Laterality: N/A;   right rotator cuff     ROTATOR CUFF REPAIR Left    TOTAL KNEE ARTHROPLASTY Left 09/08/2020   Procedure: LEFT TOTAL KNEE ARTHROPLASTY;  Surgeon: Leandrew Koyanagi, MD;  Location: [place];  Service: Orthopedics;  Laterality: Left;   TUBAL LIGATION     Past Medical History:  Diagnosis Date   Chronic kidney disease    Depression    Fibromyalgia    Gout    HTN (hypertension)    Insomnia    Morbid obesity (HCC)    Osteoarthritis    Palpitation    Sleep apnea    BP (!) 147/88 Comment: just took bp meds  Pulse 64   Ht '5\' 2"'$  (1.575 m)   Wt 202 lb (91.6 kg)   SpO2 97%   BMI 36.95 kg/m   Opioid Risk Score:   Fall Risk Score:  `1  Depression screen [facility] 2/9     03/30/2022   12:54 PM 03/30/2022   12:52 PM 02/01/2022    8:37 AM 12/21/2021    1:31 PM 11/03/2021   10:30 AM 09/02/2021    2:06 PM 08/06/2021   11:25 AM  Depression screen PHQ 2/9  Decreased Interest 1 0 3 1 0 1 2  Down, Depressed, Hopeless '1  2 1 '$ 0 1 2  PHQ - 2 Score 2 0 5 2 0 2 4  Altered sleeping   2      Tired, decreased energy   1      Change in appetite   0      Feeling bad or failure about yourself    1      Trouble concentrating   1      Moving slowly or fidgety/restless   0      Suicidal thoughts   0      PHQ-9 Score   10      Difficult doing work/chores   Somewhat difficult       Review of Systems  Constitutional: Negative.   HENT: Negative.    Eyes: Negative.   Respiratory: Negative.    Cardiovascular: Negative.  Gastrointestinal: Negative.   Endocrine: Negative.   Genitourinary: Negative.   Musculoskeletal:  Positive for arthralgias, back pain and gait problem. Negative for joint swelling, myalgias, neck pain and neck stiffness.       Pain in both legs  Allergic/Immunologic: Negative.   Hematological: Negative.   All other systems reviewed and are negative.     Objective:   Physical Exam  Gen: no distress, normal appearing HEENT: oral mucosa pink and moist,  NCAT Cardio: Reg rate Chest: normal effort, normal rate of breathing Abd: soft, non-distended Ext: no edema Psych: pleasant, normal affect Skin: intact Neuro: Alert and oriented x3    Assessment & Plan:  Female with past medical history/past surgical history of OSA, morbid obesity, hypertension, fibromyalgia, depression, gout, CKD, degenerative disc disease-lumbar, bilateral knee surgeries for torn mensici, bilateral shoulder surgeries for rotator cuff presents with bilateral L > R knee pain.   1. Bilateral knee OA - endstage  Patient was supposed to have knee replacements last year, but due to insurance changed to summer 2022  Endstage OA in b/l knee per Ortho, no films available  No benefit with Heat/Cold, PT (~2019), Robaxin, Flexaril, Tizanidine  Continue bracing prn (limit on right knee - educated)  Continue Voltaren gel  Lidocaine 5% patch ordered  -Provided with a pain relief journal and discussed that it contains foods and lifestyle tips to naturally help to improve pain. Discussed that these lifestyle strategies are also very good for health unlike some medications which can have negative side effects. Discussed that the act of keeping a journal can be therapeutic and helpful to realize patterns what helps to trigger and alleviate pain.    Decreased Cymbalta to '20mg'$  daily.   Patient states main goal is to ambulate  Wants to buy a pool - afraid of public locations due to CoVid  Recommended follow up with Nephro regarding Chondroitin sulfate, appointment next month  Tolerating mild exercise - limited by back   Continue antiinflammatory diet -Discussed following foods that may reduce pain: 1) Ginger (especially studied for arthritis)- reduce leukotriene production to decrease inflammation 2) Blueberries- high in phytonutrients that decrease inflammation 3) Salmon- marine omega-3s reduce joint swelling and pain 4) Pumpkin seeds- reduce inflammation 5) dark chocolate- reduces  inflammation 6) turmeric- reduces inflammation 7) tart cherries - reduce pain and stiffness 8) extra virgin olive oil - its compound olecanthal helps to block prostaglandins  9) chili peppers- can be eaten or applied topically via capsaicin 10) mint- helpful for headache, muscle aches, joint pain, and itching 11) garlic- reduces inflammation  Link to further information on diet for chronic pain: http://www.randall.com/   2. Sleep disturbance  Decrease Lunesta to '1mg'$  HS  Continue to await CPAP -Try to go outside near sunrise -Get exercise during the day.  -Turn off all devices an hour before bedtime.  -Teas that can benefit: chamomile, valerian root, Brahmi (Bacopa) -Can consider over the counter melatonin or magnesium glycinate (latter can also help with constipation) -Pistachios naturally increase the production of melatonin  -start amitriptyline '10mg'$  HS    3. Morbid Obesity  Continue follow up with dietitian  Encouraged weight loss  4. Myalgia   Will consider trigger point injections  5. Fibromyalgia  Prescribed aquatic therapy - had great benefit  Encouraged ROM, stretching  Continue Cymbalta  Decrease Lyrica to '50mg'$  TID  6) Spinal surgical hardware infection -discussed plan to wean off Percocet -will continue on one year of antibiotics -Discussed that the hydrocodone was helping with  wound related pain which is still horrible. Will get UDS and pain contact today.  -discussed maximizing tylenol which she is already doing  -avoid NSAIDs due to only one kidney present -discussed wean to one, then no opioids after wound vac removal  7) depression -discussed taking amitriptyline earlier in the evening -continue to follow-up with neuropsych -discussed family stressors -recommended eating saurkraut -discussed kombucha, yogurt, and kefir.  -Discussed her desire to return to work.  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 Discussed that this is a capsaicin patch, stronger than capsaicin cream. Discussed that it is currently approved for diabetic peripheral neuropathy and post-herpetic neuralgia, but that it has also shown benefit in treating other forms of neuropathy. Provided patient with link to site to learn more about the patch: CinemaBonus.fr. Discussed that the patch would be placed in office and benefits usually last 3 months. Discussed that unintended exposure to capsaicin can cause severe irritation of eyes, mucous membranes, respiratory tract, and skin, but that Qutenza is a local treatment and does not have the systemic side effects of other nerve medications. Discussed that there may be pain, itching, erythema, and decreased sensory function associated with the application of Qutenza. Side effects usually subside within 1 week. A cold pack of analgesic medications can help with these side effects. Blood pressure can also be increased due to pain associated with administration of the patch.

## 2022-03-31 ENCOUNTER — Ambulatory Visit (HOSPITAL_BASED_OUTPATIENT_CLINIC_OR_DEPARTMENT_OTHER): Payer: Commercial Managed Care - PPO | Admitting: Physical Therapy

## 2022-03-31 ENCOUNTER — Encounter: Payer: Self-pay | Admitting: Physical Medicine and Rehabilitation

## 2022-03-31 ENCOUNTER — Encounter: Payer: Self-pay | Admitting: Orthopaedic Surgery

## 2022-04-01 ENCOUNTER — Encounter: Payer: Self-pay | Admitting: Orthopaedic Surgery

## 2022-04-01 ENCOUNTER — Other Ambulatory Visit: Payer: Self-pay | Admitting: Physical Medicine and Rehabilitation

## 2022-04-01 MED ORDER — HYDROCODONE-ACETAMINOPHEN 7.5-325 MG PO TABS: 1.0000 | ORAL_TABLET | Freq: Two times a day (BID) | ORAL | 0 refills | Status: DC | PRN

## 2022-04-02 ENCOUNTER — Ambulatory Visit
Admission: RE | Admit: 2022-04-02 | Discharge: 2022-04-02 | Disposition: A | Discharge status: Home / Self Care | Payer: 59 | Source: Ambulatory Visit | Attending: Orthopaedic Surgery | Admitting: Orthopaedic Surgery

## 2022-04-02 DIAGNOSIS — M25572 Pain in left ankle and joints of left foot: Secondary | ICD-10-CM

## 2022-04-05 ENCOUNTER — Other Ambulatory Visit: Payer: Self-pay

## 2022-04-05 DIAGNOSIS — M25572 Pain in left ankle and joints of left foot: Secondary | ICD-10-CM

## 2022-04-08 ENCOUNTER — Ambulatory Visit: Payer: 59 | Admitting: Orthopedic Surgery

## 2022-04-08 DIAGNOSIS — M76822 Posterior tibial tendinitis, left leg: Secondary | ICD-10-CM

## 2022-04-11 ENCOUNTER — Other Ambulatory Visit: Payer: Self-pay | Admitting: Physical Medicine and Rehabilitation

## 2022-04-13 ENCOUNTER — Encounter: Payer: Self-pay | Admitting: Orthopedic Surgery

## 2022-04-13 NOTE — Progress Notes (Signed)
Office Visit Note   [name]           Date of Birth: [birth year]           MRN: [id number] Visit Date: 04/08/2022              Requested by: Leandrew Koyanagi, MD [address] [place],  [place] [address] PCP: Orma Render, NP  Chief Complaint  Patient presents with   Left Foot - Pain      HPI: Patient is a 72 year old woman who presents with pain at the insertion of the posterior tibial tendon.  Patient has been having left foot and ankle pain.  Previously seen by Dr. Erlinda Hong.  Patient states she has had pain since August 2023 patient states she has tried bracing inserts heat ice without relief.  Patient states she is currently on Vicodin.  Negative history for diabetes positive for hypertension negative for smoking.  Assessment & Plan: Visit Diagnoses:  1. Posterior tibial tendon dysfunction (PTTD) of left lower extremity     Plan: Recommended sole orthotics with a metatarsal pad to unload pressure on the posterior tibial tendon.  Follow-Up Instructions: Return in about 4 weeks (around 05/06/2022).   Ortho Exam  Patient is alert, oriented, no adenopathy, well-dressed, normal affect, normal respiratory effort. Examination patient has palpable pulses she has dorsiflexion to neutral with tenderness to palpation of the insertion of the posterior tibial tendon.  She has pain with resisted inversion.  Review of the MRI scan shows no abnormality of the posterior tibial tendon.  Imaging: No results found. No images are attached to the encounter.  Labs: Lab Results  Component Value Date   HGBA1C 5.7 09/03/2020   ESRSEDRATE 29 12/21/2021   ESRSEDRATE 43 (H) 11/03/2021   ESRSEDRATE 38 (H) 06/11/2021   CRP 3.5 12/21/2021   CRP 16.6 (H) 11/03/2021   CRP 11.0 (H) 06/11/2021   LABURIC 4.2 02/05/2020   REPTSTATUS 03/29/2021 FINAL 03/25/2021   GRAMSTAIN NO WBC SEEN NO ORGANISMS SEEN  03/25/2021   CULT  03/25/2021    FEW ENTEROBACTER AEROGENES FEW  MORGANELLA MORGANII RARE BACTEROIDES FRAGILIS BETA LACTAMASE POSITIVE Performed at [facility], [place] [address] [place], [place] [address]    LABORGA ENTEROBACTER AEROGENES 03/25/2021   LABORGA MORGANELLA MORGANII 03/25/2021     Lab Results  Component Value Date   ALBUMIN 2.7 (L) 03/30/2021   ALBUMIN 2.7 (L) 03/29/2021   ALBUMIN 2.9 (L) 03/28/2021    No results found for: "MG" Lab Results  Component Value Date   VD25OH 43.23 02/01/2022   VD25OH 20.3 (L) 08/06/2021   VD25OH 24.55 (L) 09/03/2020    No results found for: "PREALBUMIN"    Latest Ref Rng & Units 12/21/2021    3:59 PM 11/03/2021   11:08 AM 07/30/2021   11:31 AM  CBC EXTENDED  WBC 3.8 - 10.8 Thousand/uL 4.7  6.0  5.3   RBC 3.80 - 5.10 Million/uL 4.17  4.01  4.22   Hemoglobin 11.7 - 15.5 g/dL 12.4  12.1  12.0   HCT 35.0 - 45.0 % 37.9  35.1  36.7   Platelets 140 - 400 Thousand/uL 304  283  332   NEUT# 1,500 - 7,800 cells/uL 2,233  3,492  2,671   Lymph# 850 - 3,900 cells/uL 1,734  1,578  1,866      There is no height or weight on file to calculate BMI.  Orders:  No orders of the defined types were  placed in this encounter.  No orders of the defined types were placed in this encounter.    Procedures: No procedures performed  Clinical Data: No additional findings.  ROS:  All other systems negative, except as noted in the HPI. Review of Systems  Objective: Vital Signs: There were no vitals taken for this visit.  Specialty Comments:  No specialty comments available.  PMFS History: Patient Active Problem List   Diagnosis Date Noted   Osteopenia 03/03/2022   Acute on chronic renal insufficiency 03/27/2021   Postoperative complication of skin involving drainage from surgical wound 03/25/2021   Post op infection 03/24/2021   S/P lumbar fusion 03/24/2021   Lumbar stenosis 03/09/2021   Degenerative spondylolisthesis 02/19/2021   Primary osteoarthritis of left knee 09/08/2020   DJD  (degenerative joint disease) of knee 09/08/2020   Status post total left knee replacement 09/08/2020   Spondylosis without myelopathy or radiculopathy, lumbar region 07/17/2020   Chronic pain syndrome 07/17/2020   Myalgia 07/17/2020   OSA (obstructive sleep apnea) 05/19/2020   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/17/2020   Sleep disturbance 04/17/2020   Chronic pain of both shoulders 03/03/2020   Pain in both hands 03/03/2020   Primary osteoarthritis of both knees 03/03/2020   Pain in both feet 03/03/2020   History of chronic kidney disease 03/03/2020   DDD (degenerative disc disease), lumbar 02/05/2020   Vitamin D deficiency 01/09/2020   Hyperlipidemia 01/09/2020   CKD (chronic kidney disease) stage 3, GFR 30-59 ml/min ([place]) 01/09/2020   HTN (hypertension)    Morbid obesity ([place])    Gout    Fibromyalgia    Depression    Insomnia    Past Medical History:  Diagnosis Date   Chronic kidney disease    Depression    Fibromyalgia    Gout    HTN (hypertension)    Insomnia    Morbid obesity ([place])    Osteoarthritis    Palpitation    Sleep apnea     Family History  Problem Relation Age of Onset   Hypothyroidism Mother    Hyperlipidemia Other    Kidney failure Father    Dementia Father    Gout Father    Arthritis Father    Prostate cancer Father     Past Surgical History:  Procedure Laterality Date   APPLICATION OF WOUND VAC N/A 03/25/2021   Procedure: APPLICATION OF WOUND VAC;  Surgeon: [name], MD;  Location: [place];  Service: Orthopedics;  Laterality: N/A;   APPLICATION OF WOUND VAC N/A 03/30/2021   Procedure: WOUND VAC CHANGE 12x6x5;  Surgeon: [name], MD;  Location: [place];  Service: Orthopedics;  Laterality: N/A;   INCISION AND DRAINAGE OF WOUND N/A 03/25/2021   Procedure: LUMBAR POST OP INCISION IRRIGATION;  Surgeon: [name], MD;  Location: [place];  Service: Orthopedics;  Laterality: N/A;   JOINT REPLACEMENT     KNEE ARTHROSCOPY     LUMBAR  WOUND DEBRIDEMENT N/A 03/30/2021   Procedure: REPEAT LUMBAR WOUND DEBRIDEMENT;  Surgeon: [name], MD;  Location: [place];  Service: Orthopedics;  Laterality: N/A;   right rotator cuff     ROTATOR CUFF REPAIR Left    TOTAL KNEE ARTHROPLASTY Left 09/08/2020   Procedure: LEFT TOTAL KNEE ARTHROPLASTY;  Surgeon: Leandrew Koyanagi, MD;  Location: [place];  Service: Orthopedics;  Laterality: Left;   TUBAL LIGATION     Social History   Occupational History   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

## 2022-04-14 ENCOUNTER — Ambulatory Visit: Payer: 59 | Admitting: Orthopaedic Surgery

## 2022-04-14 ENCOUNTER — Other Ambulatory Visit: Payer: Self-pay | Admitting: Internal Medicine

## 2022-04-14 DIAGNOSIS — I10 Essential (primary) hypertension: Secondary | ICD-10-CM

## 2022-04-21 ENCOUNTER — Encounter: Payer: 59 | Admitting: Psychology

## 2022-04-21 DIAGNOSIS — G4733 Obstructive sleep apnea (adult) (pediatric): Secondary | ICD-10-CM

## 2022-04-21 DIAGNOSIS — G894 Chronic pain syndrome: Secondary | ICD-10-CM

## 2022-04-21 DIAGNOSIS — M545 Low back pain, unspecified: Secondary | ICD-10-CM

## 2022-04-21 DIAGNOSIS — F320 Major depressive disorder, single episode, mild: Secondary | ICD-10-CM | POA: Diagnosis not present

## 2022-04-21 DIAGNOSIS — M797 Fibromyalgia: Secondary | ICD-10-CM

## 2022-04-21 DIAGNOSIS — G629 Polyneuropathy, unspecified: Secondary | ICD-10-CM

## 2022-04-21 DIAGNOSIS — G8929 Other chronic pain: Secondary | ICD-10-CM

## 2022-04-21 DIAGNOSIS — M5136 Other intervertebral disc degeneration, lumbar region: Secondary | ICD-10-CM

## 2022-04-22 ENCOUNTER — Ambulatory Visit: Payer: 59 | Admitting: Nurse Practitioner

## 2022-04-22 ENCOUNTER — Encounter: Payer: Self-pay | Admitting: Nurse Practitioner

## 2022-04-22 VITALS — BP 120/80 | HR 65 | Ht 62.5 in | Wt 203.2 lb

## 2022-04-22 DIAGNOSIS — I10 Essential (primary) hypertension: Secondary | ICD-10-CM

## 2022-04-22 DIAGNOSIS — E782 Mixed hyperlipidemia: Secondary | ICD-10-CM

## 2022-04-22 DIAGNOSIS — M8580 Other specified disorders of bone density and structure, unspecified site: Secondary | ICD-10-CM

## 2022-04-22 DIAGNOSIS — H66002 Acute suppurative otitis media without spontaneous rupture of ear drum, left ear: Secondary | ICD-10-CM

## 2022-04-22 DIAGNOSIS — E559 Vitamin D deficiency, unspecified: Secondary | ICD-10-CM

## 2022-04-22 DIAGNOSIS — M0579 Rheumatoid arthritis with rheumatoid factor of multiple sites without organ or systems involvement: Secondary | ICD-10-CM

## 2022-04-22 DIAGNOSIS — N1832 Chronic kidney disease, stage 3b: Secondary | ICD-10-CM

## 2022-04-22 MED ORDER — CIPROFLOXACIN-DEXAMETHASONE 0.3-0.1 % OT SUSP: 4.0000 [drp] | Freq: Two times a day (BID) | OTIC | 0 refills | Status: DC

## 2022-04-22 MED ORDER — DAPAGLIFLOZIN PROPANEDIOL 5 MG PO TABS: 5.0000 mg | ORAL_TABLET | Freq: Every day | ORAL | 3 refills | Status: DC

## 2022-04-22 NOTE — Progress Notes (Signed)
Orma Render, DNP, AGNP-c [facility] [address] [place], [place] [address] Main Office [phone]   New patient visit   [name]   DOB: 05/01/1950   72 y.o. Female  MRN: [id number] Visit Date: 04/22/2022  Patient Care Team: Orma Render, NP as PCP - General (Nurse Practitioner)  Today's Vitals   04/22/22 0932  BP: 120/80  Pulse: 65  Weight: 203 lb 3.2 oz (92.2 kg)  Height: 5' 2.5" (1.588 m)   Body mass index is 36.57 kg/m.   Today's healthcare provider: Orma Render, NP   Chief Complaint  Patient presents with   Annual Exam    CPE no other issues, sign handicap placard    Subjective    Amy Hooper is a 72 y.o. female who presents today as a new patient to establish care.    Patient endorses the following concerns presently: Concerns with creatinine, blood pressure, cholesterol.  BP At her home her readings are typically in the 130's and the bottom number is in the mid 80's She would like to finish her current Rx, but is open to changing to the amlodipine-vasartan combo pill in the future.  Creatinine She has a history of CKD with elevated creatinine and decreased GFR. She tells me that she is concerned about her kidney function and preserving this as much as possible.   History reviewed and reveals the following: Past Medical History:  Diagnosis Date   Chronic kidney disease    Depression    Fibromyalgia    Gout    HTN (hypertension)    Insomnia    Morbid obesity ([place])    Osteoarthritis    Palpitation    Sleep apnea    Past Surgical History:  Procedure Laterality Date   APPLICATION OF WOUND VAC N/A 03/25/2021   Procedure: APPLICATION OF WOUND VAC;  Surgeon: [name], MD;  Location: [facility];  Service: Orthopedics;  Laterality: N/A;   APPLICATION OF WOUND VAC N/A 03/30/2021   Procedure: WOUND VAC CHANGE 12x6x5;  Surgeon: [name], MD;  Location: [facility];  Service: Orthopedics;  Laterality:  N/A;   INCISION AND DRAINAGE OF WOUND N/A 03/25/2021   Procedure: LUMBAR POST OP INCISION IRRIGATION;  Surgeon: [name], MD;  Location: [facility];  Service: Orthopedics;  Laterality: N/A;   JOINT REPLACEMENT     KNEE ARTHROSCOPY     LUMBAR WOUND DEBRIDEMENT N/A 03/30/2021   Procedure: REPEAT LUMBAR WOUND DEBRIDEMENT;  Surgeon: [name], MD;  Location: [facility];  Service: Orthopedics;  Laterality: N/A;   right rotator cuff     ROTATOR CUFF REPAIR Left    TOTAL KNEE ARTHROPLASTY Left 09/08/2020   Procedure: LEFT TOTAL KNEE ARTHROPLASTY;  Surgeon: Leandrew Koyanagi, MD;  Location: [facility];  Service: Orthopedics;  Laterality: Left;   TUBAL LIGATION     Family Status  Relation Name Status   Mother  Alive   Other  (Not Specified)   Father  Deceased   Sister  Alive   Brother  Alive   Family History  Problem Relation Age of Onset   Hypothyroidism Mother    Hyperlipidemia Other    Kidney failure Father    Dementia Father    Gout Father    Arthritis Father    Prostate cancer Father    Social History   Socioeconomic History   Marital status: Married    Spouse name: Not on file  Number of children: 3   Years of education: Not on file   Highest education level: Bachelor's degree (e.g., BA, AB, BS)  Occupational History   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     Social Determinants of Health   Financial Resource Strain: Low Risk  (03/01/2021)   Overall Financial Resource Strain (CARDIA)    Difficulty of Paying Living Expenses: Not hard at all  Food Insecurity: No Food Insecurity (03/01/2021)   Hunger Vital Sign    Worried About Running  Out of Food in the Last Year: Never true    Ran Out of Food in the Last Year: Never true  Transportation Needs: No Transportation Needs (03/01/2021)   PRAPARE - Hydrologist (Medical): No    Lack of Transportation (Non-Medical): No  Physical Activity: Unknown (03/01/2021)   Exercise Vital Sign    Days of Exercise per Week: 0 days    Minutes of Exercise per Session: Not on file  Stress: No Stress Concern Present (03/01/2021)   [place]    Feeling of Stress : Only a little  Social Connections: Moderately Isolated (03/01/2021)   Social Connection and Isolation Panel [NHANES]    Frequency of Communication with Friends and Family: More than three times a week    Frequency of Social Gatherings with Friends and Family: Once a week    Attends Religious Services: Never    Marine scientist or Organizations: No    Attends Music therapist: Not on file    Marital Status: Married   Outpatient Medications Prior to Visit  Medication Sig Note   allopurinol (ZYLOPRIM) 100 MG tablet TAKE 2 TABLETS BY MOUTH DAILY    amLODipine (NORVASC) 5 MG tablet TAKE 1 TABLET (5 MG TOTAL) BY MOUTH DAILY.    DULoxetine (CYMBALTA) 20 MG capsule TAKE 1 CAPSULE BY MOUTH DAILY    eszopiclone (LUNESTA) 1 MG TABS tablet Take 1 tablet (1 mg total) by mouth at bedtime as needed for sleep. Take immediately before bedtime    levofloxacin (LEVAQUIN) 500 MG tablet TAKE 1 TABLET BY MOUTH DAILY    loratadine (CLARITIN) 10 MG tablet Take 10 mg by mouth daily as needed for allergies.    losartan (COZAAR) 50 MG tablet TAKE 1 TABLET BY MOUTH EVERY DAY    Vitamin D, Ergocalciferol, (DRISDOL) 1.25 MG (50000 UNIT) CAPS capsule TAKE 1 CAPSULE (50,000 UNITS TOTAL) BY MOUTH EVERY 7 (SEVEN) DAYS    [DISCONTINUED] HYDROcodone-acetaminophen (NORCO) 7.5-325 MG tablet Take 1 tablet by mouth 2 (two) times daily as needed for moderate  pain. Do Not Fill Before 03/20/2022    [DISCONTINUED] pregabalin (LYRICA) 75 MG capsule Take 1 capsule (75 mg total) by mouth 3 (three) times daily.    diazepam (VALIUM) 2 MG tablet Take one pill one hour prior to mri and then repeat just prior if needed (Patient not taking: Reported on 04/22/2022) 04/22/2022: Prn for MRI   fluticasone (FLONASE) 50 MCG/ACT nasal spray Place 1 spray into both nostrils daily  as needed for allergies. (Patient not taking: Reported on 04/22/2022) 04/22/2022: prn   hydrocortisone (ANUSOL-HC) 2.5 % rectal cream PLACE 1 APPLICATION RECTALLY 2 (TWO) TIMES DAILY. (Patient not taking: Reported on 04/22/2022)    [DISCONTINUED] amoxicillin-clavulanate (AUGMENTIN) 500-125 MG tablet TAKE 1 TABLET BY MOUTH IN THE  MORNING AND AT BEDTIME (Patient not taking: Reported on 04/22/2022)    [DISCONTINUED] atorvastatin (LIPITOR) 10 MG tablet TAKE 1 TABLET BY MOUTH EVERYDAY AT BEDTIME (Patient not taking: Reported on 04/22/2022) 04/22/2022: Not taking   [DISCONTINUED] predniSONE (DELTASONE) 5 MG tablet Take 4 tabs po x 2 days, 3  tabs po x 2 days, 2 tabs po x 2 days, 1  tab po x 2 days (Patient not taking: Reported on 04/22/2022)    No facility-administered medications prior to visit.   Allergies  Allergen Reactions   Cefuroxime Axetil Rash and Hives   Clarithromycin Rash and Hives    Other reaction(s): Unknown   Nsaids Nausea And Vomiting    Other reaction(s): Unknown   Cefuroxime     Other reaction(s): Unknown   Robaxin [Methocarbamol] Nausea And Vomiting    "It tears my stomach up."   Toradol [Ketorolac Tromethamine] Nausea And Vomiting   Tramadol Nausea And Vomiting and Other (See Comments)    Upset stomach Other reaction(s): Unknown   Immunization History  Administered Date(s) Administered   Influenza-Unspecified 01/31/2020, 01/13/2021   Moderna Sars-Covid-2 Vaccination 05/02/2019, 05/30/2019, 01/23/2020   Pneumococcal Conjugate-13 06/05/2018   Pneumococcal Polysaccharide-23 01/08/2020    Tdap 01/08/2020    Health Maintenance Due Health Maintenance Topics with due status: Overdue     Topic Date Due   Hepatitis C Screening Never done   COVID-19 Vaccine 11/20/2021    Review of Systems All review of systems negative except what is listed in the HPI   Objective    BP 120/80   Pulse 65   Ht 5' 2.5" (1.588 m)   Wt 203 lb 3.2 oz (92.2 kg)   BMI 36.57 kg/m  Physical Exam Vitals and nursing note reviewed.  Constitutional:      General: She is not in acute distress.    Appearance: Normal appearance.  HENT:     Right Ear: Hearing and tympanic membrane normal.     Left Ear: Hearing normal. Tenderness present. A middle ear effusion is present.  Eyes:     Extraocular Movements: Extraocular movements intact.     Conjunctiva/sclera: Conjunctivae normal.     Pupils: Pupils are equal, round, and reactive to light.  Neck:     Vascular: No carotid bruit.  Cardiovascular:     Rate and Rhythm: Normal rate and regular rhythm.     Pulses: Normal pulses.     Heart sounds: Normal heart sounds. No murmur heard. Pulmonary:     Effort: Pulmonary effort is normal.     Breath sounds: Normal breath sounds. No wheezing.  Abdominal:     General: Bowel sounds are normal.     Palpations: Abdomen is soft.  Musculoskeletal:        General: Normal range of motion.     Cervical back: Normal range of motion.     Right lower leg: No edema.     Left lower leg: No edema.  Skin:    General: Skin is warm and dry.     Capillary Refill: Capillary refill takes less than 2 seconds.  Neurological:     General: No focal deficit present.     Mental Status: She is  alert and oriented to person, place, and time.  Psychiatric:        Mood and Affect: Mood normal.        Behavior: Behavior normal.        Thought Content: Thought content normal.        Judgment: Judgment normal.     Results for orders placed or performed in visit on 04/22/22  CBC with Differential/Platelet  Result Value Ref  Range   WBC 4.5 3.4 - 10.8 x10E3/uL   RBC 4.35 3.77 - 5.28 x10E6/uL   Hemoglobin 12.5 11.1 - 15.9 g/dL   Hematocrit 37.7 34.0 - 46.6 %   MCV 87 79 - 97 fL   MCH 28.7 26.6 - 33.0 pg   MCHC 33.2 31.5 - 35.7 g/dL   RDW 13.3 11.7 - 15.4 %   Platelets 282 150 - 450 x10E3/uL   Neutrophils 46 Not Estab. %   Lymphs 38 Not Estab. %   Monocytes 9 Not Estab. %   Eos 7 Not Estab. %   Basos 0 Not Estab. %   Neutrophils Absolute 2.1 1.4 - 7.0 x10E3/uL   Lymphocytes Absolute 1.7 0.7 - 3.1 x10E3/uL   Monocytes Absolute 0.4 0.1 - 0.9 x10E3/uL   EOS (ABSOLUTE) 0.3 0.0 - 0.4 x10E3/uL   Basophils Absolute 0.0 0.0 - 0.2 x10E3/uL   Immature Granulocytes 0 Not Estab. %   Immature Grans (Abs) 0.0 0.0 - 0.1 x10E3/uL  Comprehensive metabolic panel  Result Value Ref Range   Glucose 80 70 - 99 mg/dL   BUN 19 8 - 27 mg/dL   Creatinine, Ser 1.49 (H) 0.57 - 1.00 mg/dL   eGFR 38 (L) >59 mL/min/1.73   BUN/Creatinine Ratio 13 12 - 28   Sodium 141 134 - 144 mmol/L   Potassium 4.5 3.5 - 5.2 mmol/L   Chloride 103 96 - 106 mmol/L   CO2 23 20 - 29 mmol/L   Calcium 10.0 8.7 - 10.3 mg/dL   Total Protein 6.6 6.0 - 8.5 g/dL   Albumin 4.4 3.9 - 4.9 g/dL   Globulin, Total 2.2 1.5 - 4.5 g/dL   Albumin/Globulin Ratio 2.0 1.2 - 2.2   Bilirubin Total 0.3 0.0 - 1.2 mg/dL   Alkaline Phosphatase 104 44 - 121 IU/L   AST 14 0 - 40 IU/L   ALT 11 0 - 32 IU/L  Hemoglobin A1c  Result Value Ref Range   Hgb A1c MFr Bld 4.8 4.8 - 5.6 %   Est. average glucose Bld gHb Est-mCnc 91 mg/dL  Lipid panel  Result Value Ref Range   Cholesterol, Total 202 (H) 100 - 199 mg/dL   Triglycerides 67 0 - 149 mg/dL   HDL 72 >39 mg/dL   VLDL Cholesterol Cal 12 5 - 40 mg/dL   LDL Chol Calc (NIH) 118 (H) 0 - 99 mg/dL   Chol/HDL Ratio 2.8 0.0 - 4.4 ratio  TSH  Result Value Ref Range   TSH 2.180 0.450 - 4.500 uIU/mL  VITAMIN D 25 Hydroxy (Vit-D Deficiency, Fractures)  Result Value Ref Range   Vit D, 25-Hydroxy 36.1 30.0 - 100.0 ng/mL     Assessment & Plan      Problem List Items Addressed This Visit     HTN (hypertension)    Chronic.  No alarm symptoms present at this time.  Goal blood pressure less than less than 130/80.  Refills have been provided today.  Labs today to check electrolytes and kidney function.  Recommend orders and  follow up as documented in [place], reviewed diet, exercise and weight control, cardiovascular risk and specific lipid/LDL goals reviewed, reviewed medications and side effects in detail, the following changes are made - [place] added for kidney and cardiovascular protection, labs ordered and recent results reviewed with patient. Currently is not followed with cardiology. We will make changes to plan of care as necessary based on lab results. Plan to follow-up in 89month. When refills are needed, we can plan to combine her BP medications into a combo pill.        Relevant Medications   dapagliflozin propanediol (FARXIGA) 5 MG TABS tablet   Other Relevant Orders   CBC with Differential/Platelet (Completed)   Comprehensive metabolic panel (Completed)   Hemoglobin A1c (Completed)   Lipid panel (Completed)   TSH (Completed)   VITAMIN D 25 Hydroxy (Vit-D Deficiency, Fractures) (Completed)   Morbid obesity (HCC)    Chronic. Labs pending. Diet and exercise recommendations provided.      Relevant Medications   dapagliflozin propanediol (FARXIGA) 5 MG TABS tablet   Other Relevant Orders   CBC with Differential/Platelet (Completed)   Comprehensive metabolic panel (Completed)   Hemoglobin A1c (Completed)   Lipid panel (Completed)   TSH (Completed)   VITAMIN D 25 Hydroxy (Vit-D Deficiency, Fractures) (Completed)   Vitamin D deficiency - Primary    Labs pending. Will determine need for replacement dosing. Given hx of osteopenia, minimal dosing is recommended for protection. Will let patient know recommendations once labs are back.       Relevant Orders   CBC with Differential/Platelet (Completed)    Comprehensive metabolic panel (Completed)   Hemoglobin A1c (Completed)   Lipid panel (Completed)   TSH (Completed)   VITAMIN D 25 Hydroxy (Vit-D Deficiency, Fractures) (Completed)   Hyperlipidemia    Chronic. No statin therapy at this time. Unable to tolerate atorvastatin. Labs pending. Based on findings may consider starting pravastatin at low doses to see if this is something that she can tolerate given the lower profile of the medication. We can try one day a week dosing and move up as tolerated, if needed.       Relevant Medications   dapagliflozin propanediol (FARXIGA) 5 MG TABS tablet   Other Relevant Orders   CBC with Differential/Platelet (Completed)   Comprehensive metabolic panel (Completed)   Hemoglobin A1c (Completed)   Lipid panel (Completed)   TSH (Completed)   VITAMIN D 25 Hydroxy (Vit-D Deficiency, Fractures) (Completed)   CKD (chronic kidney disease) stage 3, GFR 30-59 ml/min (HCC)    History of CKD with reported concerns of preservation of kidney function. Discussion today on medications that may be helpful for both kidney and cardiovascular health. She is interested in trying this. Order for [facility], will likely need PA- patient understands this may take a few days to go through. Will follow up based on labs today.       Relevant Medications   dapagliflozin propanediol (FARXIGA) 5 MG TABS tablet   Osteopenia    Chronic. Will obtain labs today to monitor vitamin D and calcium. No alarm sx present at this time. PLAN: Ensure that you are getting some form of exercise daily to help with muscle and bone strength. Walking, stretching, and light weights (can of veggies in each hand) can be great ways to increase this.  Take calcium and vitamin D supplements as directed.       Relevant Orders   CBC with Differential/Platelet (Completed)   Comprehensive metabolic panel (Completed)  Hemoglobin A1c (Completed)   Lipid panel (Completed)   TSH (Completed)    VITAMIN D 25 Hydroxy (Vit-D Deficiency, Fractures) (Completed)   Rheumatoid arthritis with rheumatoid factor of multiple sites without organ or systems involvement (HCC)    Chronic. Followed with rheumatology for management. Not currently on any medications that need close lab monitoring for this condition. Will continue to follow and provide supportive care in collaboration with specialist.       Non-recurrent acute suppurative otitis media of left ear without spontaneous rupture of tympanic membrane    Left ear tenderness reported with presence of bulging TM and purulent effusion noted on exam. Will send treatment with antibiotic therapy and monitor closely. Recommend restart flonase to help reduce inflammation in the ears and sinuses to help with prevention.       Relevant Medications   ciprofloxacin-dexamethasone (CIPRODEX) OTIC suspension     Return in about 3 months (around 07/21/2022) for Med Management.      Nikia Levels, Coralee Pesa, NP, DNP, AGNP-C [facility]

## 2022-04-22 NOTE — Patient Instructions (Addendum)
I have sent in ear drops for your left ear. Use this for 5-7 days. This should help the ear pain/pressure you have.   I will let you know what your labs show.   I have sent in the [place] to try for your kidneys. I would like to recheck your kidney levels in 3 months to see how this is doing.   If you want to switch your blood pressure medication to the single pill, please let me know and I will be happy to send that in.

## 2022-04-23 LAB — CBC WITH DIFFERENTIAL/PLATELET
Basophils Absolute: 0 10*3/uL (ref 0.0–0.2)
Basos: 0 %
EOS (ABSOLUTE): 0.3 10*3/uL (ref 0.0–0.4)
Eos: 7 %
Hematocrit: 37.7 % (ref 34.0–46.6)
Hemoglobin: 12.5 g/dL (ref 11.1–15.9)
Immature Grans (Abs): 0 10*3/uL (ref 0.0–0.1)
Immature Granulocytes: 0 %
Lymphocytes Absolute: 1.7 10*3/uL (ref 0.7–3.1)
Lymphs: 38 %
MCH: 28.7 pg (ref 26.6–33.0)
MCHC: 33.2 g/dL (ref 31.5–35.7)
MCV: 87 fL (ref 79–97)
Monocytes Absolute: 0.4 10*3/uL (ref 0.1–0.9)
Monocytes: 9 %
Neutrophils Absolute: 2.1 10*3/uL (ref 1.4–7.0)
Neutrophils: 46 %
Platelets: 282 10*3/uL (ref 150–450)
RBC: 4.35 x10E6/uL (ref 3.77–5.28)
RDW: 13.3 % (ref 11.7–15.4)
WBC: 4.5 10*3/uL (ref 3.4–10.8)

## 2022-04-23 LAB — COMPREHENSIVE METABOLIC PANEL
ALT: 11 IU/L (ref 0–32)
AST: 14 IU/L (ref 0–40)
Albumin/Globulin Ratio: 2 (ref 1.2–2.2)
Albumin: 4.4 g/dL (ref 3.9–4.9)
Alkaline Phosphatase: 104 IU/L (ref 44–121)
BUN/Creatinine Ratio: 13 (ref 12–28)
BUN: 19 mg/dL (ref 8–27)
Bilirubin Total: 0.3 mg/dL (ref 0.0–1.2)
CO2: 23 mmol/L (ref 20–29)
Calcium: 10 mg/dL (ref 8.7–10.3)
Chloride: 103 mmol/L (ref 96–106)
Creatinine, Ser: 1.49 mg/dL — ABNORMAL HIGH (ref 0.57–1.00)
Globulin, Total: 2.2 g/dL (ref 1.5–4.5)
Glucose: 80 mg/dL (ref 70–99)
Potassium: 4.5 mmol/L (ref 3.5–5.2)
Sodium: 141 mmol/L (ref 134–144)
Total Protein: 6.6 g/dL (ref 6.0–8.5)
eGFR: 38 mL/min/{1.73_m2} — ABNORMAL LOW (ref >59)

## 2022-04-23 LAB — HEMOGLOBIN A1C
Est. average glucose Bld gHb Est-mCnc: 91 mg/dL
Hgb A1c MFr Bld: 4.8 % (ref 4.8–5.6)

## 2022-04-23 LAB — TSH: TSH: 2.18 u[IU]/mL (ref 0.450–4.500)

## 2022-04-23 LAB — LIPID PANEL
Chol/HDL Ratio: 2.8 ratio (ref 0.0–4.4)
Cholesterol, Total: 202 mg/dL — ABNORMAL HIGH (ref 100–199)
HDL: 72 mg/dL (ref >39)
LDL Chol Calc (NIH): 118 mg/dL — ABNORMAL HIGH (ref 0–99)
Triglycerides: 67 mg/dL (ref 0–149)
VLDL Cholesterol Cal: 12 mg/dL (ref 5–40)

## 2022-04-23 LAB — VITAMIN D 25 HYDROXY (VIT D DEFICIENCY, FRACTURES): Vit D, 25-Hydroxy: 36.1 ng/mL (ref 30.0–100.0)

## 2022-04-28 ENCOUNTER — Encounter: Payer: Self-pay | Admitting: Orthopaedic Surgery

## 2022-04-28 ENCOUNTER — Encounter: Payer: Self-pay | Admitting: Nurse Practitioner

## 2022-04-28 NOTE — Telephone Encounter (Signed)
Please get auth for the end of march. Thank you

## 2022-04-29 ENCOUNTER — Telehealth: Payer: Self-pay | Admitting: Nurse Practitioner

## 2022-04-29 ENCOUNTER — Encounter: Payer: 59 | Attending: Registered Nurse | Admitting: Registered Nurse

## 2022-04-29 ENCOUNTER — Encounter: Payer: Self-pay | Admitting: Registered Nurse

## 2022-04-29 VITALS — BP 130/79 | HR 56 | Ht 62.0 in | Wt 201.6 lb

## 2022-04-29 DIAGNOSIS — G894 Chronic pain syndrome: Secondary | ICD-10-CM | POA: Diagnosis present

## 2022-04-29 DIAGNOSIS — Z79891 Long term (current) use of opiate analgesic: Secondary | ICD-10-CM | POA: Insufficient documentation

## 2022-04-29 DIAGNOSIS — R001 Bradycardia, unspecified: Secondary | ICD-10-CM | POA: Insufficient documentation

## 2022-04-29 DIAGNOSIS — G629 Polyneuropathy, unspecified: Secondary | ICD-10-CM | POA: Insufficient documentation

## 2022-04-29 DIAGNOSIS — Z5181 Encounter for therapeutic drug level monitoring: Secondary | ICD-10-CM | POA: Insufficient documentation

## 2022-04-29 DIAGNOSIS — M797 Fibromyalgia: Secondary | ICD-10-CM | POA: Diagnosis present

## 2022-04-29 DIAGNOSIS — M7062 Trochanteric bursitis, left hip: Secondary | ICD-10-CM | POA: Diagnosis present

## 2022-04-29 DIAGNOSIS — M7061 Trochanteric bursitis, right hip: Secondary | ICD-10-CM | POA: Diagnosis present

## 2022-04-29 DIAGNOSIS — M25512 Pain in left shoulder: Secondary | ICD-10-CM | POA: Insufficient documentation

## 2022-04-29 DIAGNOSIS — G8929 Other chronic pain: Secondary | ICD-10-CM | POA: Diagnosis present

## 2022-04-29 DIAGNOSIS — M545 Low back pain, unspecified: Secondary | ICD-10-CM | POA: Insufficient documentation

## 2022-04-29 DIAGNOSIS — M2550 Pain in unspecified joint: Secondary | ICD-10-CM | POA: Insufficient documentation

## 2022-04-29 DIAGNOSIS — M1711 Unilateral primary osteoarthritis, right knee: Secondary | ICD-10-CM | POA: Diagnosis present

## 2022-04-29 DIAGNOSIS — M25511 Pain in right shoulder: Secondary | ICD-10-CM | POA: Insufficient documentation

## 2022-04-29 MED ORDER — PREGABALIN 75 MG PO CAPS: 75.0000 mg | ORAL_CAPSULE | Freq: Two times a day (BID) | ORAL | 1 refills | Status: DC

## 2022-04-29 MED ORDER — HYDROCODONE-ACETAMINOPHEN 7.5-325 MG PO TABS: 1.0000 | ORAL_TABLET | Freq: Two times a day (BID) | ORAL | 0 refills | Status: DC | PRN

## 2022-04-29 NOTE — Progress Notes (Signed)
Subjective:    Patient ID: Amy Hooper, female    DOB: [birth year], 72 y.o.   MRN: [id number]  HPI: Amy Hooper is a 72 y.o. female who returns for follow up appointment for chronic pain and medication refill. She states her pain is located in her bilateral shoulders, lower back, bilateral hips R>L,and bilateral feet with tingling and burning. She rates her pain 8. Her current exercise regime is walking and performing stretching exercises.  Amy Hooper arrived bradycardic, apical pulse will check. She will call her PCP and keep a vital signs log. She denies any symptoms at this time, she was instructed to go to ED for evaluation if any symptoms occurs. She verbalizes understanding.  Amy Hooper Morphine equivalent is 15.00 MME.   Last UDS was Performed on 02/09/2022, it was consistent.     Pain Inventory Average Pain 8 Pain Right Now 8 My pain is dull and aching  In the last 24 hours, has pain interfered with the following? General activity 7 Relation with others 5 Enjoyment of life 8 What TIME of day is your pain at its worst? morning , daytime, and night Sleep (in general) Fair  Pain is worse with: walking, bending, sitting, inactivity, standing, and some activites Pain improves with: rest, heat/ice, and medication Relief from Meds: 3  Family History  Problem Relation Age of Onset   Hypothyroidism Mother    Hyperlipidemia Other    Kidney failure Father    Dementia Father    Gout Father    Arthritis Father    Prostate cancer Father    Social History   Socioeconomic History   Marital status: Married    Spouse name: Not on file   Number of children: 3   Years of education: Not on file   Highest education level: Bachelor's degree (e.g., BA, AB, BS)  Occupational History   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     Social Determinants of Health   Financial Resource Strain: Low Risk  (03/01/2021)   Overall Financial Resource Strain (CARDIA)    Difficulty of Paying Living Expenses: Not hard at all  Food Insecurity: No Food Insecurity (03/01/2021)   Hunger Vital Sign    Worried About Running Out of Food in the Last Year: Never true    Ran Out of Food in the Last Year: Never true  Transportation Needs: No Transportation Needs (03/01/2021)   PRAPARE - Hydrologist (Medical): No    Lack of Transportation (Non-Medical): No  Physical Activity: Unknown (03/01/2021)   Exercise Vital Sign    Days of Exercise per Week: 0 days    Minutes of Exercise per Session: Not on file  Stress: No Stress Concern Present (03/01/2021)   [place]    Feeling of Stress : Only a little  Social Connections: Moderately Isolated (03/01/2021)   Social Connection and Isolation Panel [NHANES]    Frequency of Communication with Friends and Family: More than three times a week    Frequency  of Social Gatherings with Friends and Family: Once a week    Attends Religious Services: Never    Marine scientist or Organizations: No    Attends Music therapist: Not on file    Marital Status: Married   Past Surgical History:  Procedure Laterality Date   APPLICATION OF WOUND VAC N/A 03/25/2021   Procedure: APPLICATION OF WOUND VAC;  Surgeon: [name], MD;  Location: [place];  Service: Orthopedics;  Laterality: N/A;   APPLICATION OF WOUND VAC N/A 03/30/2021   Procedure: WOUND VAC CHANGE 12x6x5;  Surgeon: [name], MD;  Location: [place];  Service: Orthopedics;  Laterality: N/A;   INCISION AND DRAINAGE OF WOUND N/A  03/25/2021   Procedure: LUMBAR POST OP INCISION IRRIGATION;  Surgeon: [name], MD;  Location: [place];  Service: Orthopedics;  Laterality: N/A;   JOINT REPLACEMENT     KNEE ARTHROSCOPY     LUMBAR WOUND DEBRIDEMENT N/A 03/30/2021   Procedure: REPEAT LUMBAR WOUND DEBRIDEMENT;  Surgeon: [name], MD;  Location: [place];  Service: Orthopedics;  Laterality: N/A;   right rotator cuff     ROTATOR CUFF REPAIR Left    TOTAL KNEE ARTHROPLASTY Left 09/08/2020   Procedure: LEFT TOTAL KNEE ARTHROPLASTY;  Surgeon: Leandrew Koyanagi, MD;  Location: [place];  Service: Orthopedics;  Laterality: Left;   TUBAL LIGATION     Past Surgical History:  Procedure Laterality Date   APPLICATION OF WOUND VAC N/A 03/25/2021   Procedure: APPLICATION OF WOUND VAC;  Surgeon: [name], MD;  Location: [place];  Service: Orthopedics;  Laterality: N/A;   APPLICATION OF WOUND VAC N/A 03/30/2021   Procedure: WOUND VAC CHANGE 12x6x5;  Surgeon: [name], MD;  Location: [place];  Service: Orthopedics;  Laterality: N/A;   INCISION AND DRAINAGE OF WOUND N/A 03/25/2021   Procedure: LUMBAR POST OP INCISION IRRIGATION;  Surgeon: [name], MD;  Location: [place];  Service: Orthopedics;  Laterality: N/A;   JOINT REPLACEMENT     KNEE ARTHROSCOPY     LUMBAR WOUND DEBRIDEMENT N/A 03/30/2021   Procedure: REPEAT LUMBAR WOUND DEBRIDEMENT;  Surgeon: [name], MD;  Location: [place];  Service: Orthopedics;  Laterality: N/A;   right rotator cuff     ROTATOR CUFF REPAIR Left    TOTAL KNEE ARTHROPLASTY Left 09/08/2020   Procedure: LEFT TOTAL KNEE ARTHROPLASTY;  Surgeon: Leandrew Koyanagi, MD;  Location: [place];  Service: Orthopedics;  Laterality: Left;   TUBAL LIGATION     Past Medical History:  Diagnosis Date   Chronic kidney disease    Depression    Fibromyalgia    Gout    HTN (hypertension)    Insomnia    Morbid obesity ([place])    Osteoarthritis    Palpitation    Sleep apnea    There were no vitals taken for this visit.  Opioid Risk  Score:   Fall Risk Score:  `1  Depression screen [facility] 2/9     04/22/2022    9:31 AM 03/30/2022   12:54 PM 03/30/2022   12:52 PM 02/01/2022    8:37 AM 12/21/2021    1:31 PM 11/03/2021   10:30 AM 09/02/2021    2:06 PM  Depression screen PHQ 2/9  Decreased Interest 0 1 0 3 1 0 1  Down, Depressed, Hopeless 0 1  2 1 $ 0 1  PHQ - 2 Score 0 2 0 5 2 0 2  Altered sleeping    2     Tired, decreased energy    1     Change in appetite    0     Feeling bad or failure about yourself     1     Trouble concentrating    1     Moving slowly or fidgety/restless    0     Suicidal thoughts    0     PHQ-9 Score    10     Difficult doing work/chores    Somewhat difficult       Review of Systems  Constitutional: Negative.   HENT: Negative.    Eyes: Negative.   Respiratory: Negative.    Cardiovascular: Negative.   Gastrointestinal: Negative.   Endocrine: Negative.   Genitourinary: Negative.   Musculoskeletal:  Positive for arthralgias and back pain.  Skin: Negative.   Allergic/Immunologic: Negative.   Neurological: Negative.   Hematological: Negative.   Psychiatric/Behavioral: Negative.        Objective:   Physical Exam Vitals and nursing note reviewed.  Constitutional:      Appearance: Normal appearance.  Cardiovascular:     Rate and Rhythm: Regular rhythm. Bradycardia present.     Pulses: Normal pulses.     Heart sounds: Normal heart sounds.  Pulmonary:     Effort: Pulmonary effort is normal.     Breath sounds: Normal breath sounds.  Musculoskeletal:     Cervical back: Normal range of motion and neck supple.     Comments: Normal Muscle Bulk and Muscle Testing Reveals:  Upper Extremities: Full ROM and Muscle Strength 5/5  Lumbar Paraspinal Tenderness: L-4-L-5 Lower Extremities : Full ROM and Muscle Strength 5/5 Right Lower Extremity Flexion Produces Pain into her Right Patella Left Lower Extremity: Full ROM and Muscle Strength 5/5 Arises from Table Slowly using cane for support Narrow  Based Gait     Skin:    General: Skin is warm and dry.  Neurological:     Mental Status: She is alert and oriented to person, place, and time.  Psychiatric:        Mood and Affect: Mood normal.        Behavior: Behavior normal.         Assessment & Plan:  1, Chronic Bilateral Low Back Pain: S/P Lumbar Fusion: Dr Lorin Mercy Following. Continue HEP as Tolerated. Continue to Monitor. 04/29/2022 2. Post Operative Infection: ID Following: Continue Antibiotics. Continue to Monitor. 04/29/2022 3. Fibromyalgia: Continue HEP as Tolerated. Continue Lyrica. Continue to Monitor. 04/29/2022 4. Primary Osteoarthritis: Left Knee: S/P on 09/08/20: Dr Erlinda Hong: LEFT TOTAL KNEE ARTHROPLASTY. Continue HEP as Tolerated. Continue to Monitor.  5. Primary Osteoarthritis Right Knee: Continue HEP as Tolerated. Ortho Following. Continue to Monitor. 04/29/2022 6. Chronic Pain Syndrome: Refilled: Hydrocodone 7.5 mf /325 one tablet twice a day as needed for pain #60. Continue Amitriptyline  Refilled: Hydrocodone 10 mg/325 mg one tablet twice a day as needed for pain #60. We will continue the opioid monitoring program, this consists of regular clinic visits, examinations, urine drug screen, pill counts as well as use of [place] Controlled Substance Reporting system. A 12 month History has been reviewed on the [place] Controlled Substance Reporting System on 04/29/2022 7. Depression: Continue Cymbalta. Continue to Monitor. 04/29/2022 8. Bradycardia: She will F/U with her PCP and Keep Vital Signs Log, she was encouraged to go to ED for Evaluation if any symptoms developed. At this time she is asymptomatic.  F/U in 2 months

## 2022-04-29 NOTE — Telephone Encounter (Signed)
P,A,FARXIGA

## 2022-04-29 NOTE — Telephone Encounter (Signed)
P,A,FARXIGA completed

## 2022-04-30 ENCOUNTER — Encounter: Payer: Self-pay | Admitting: Orthopaedic Surgery

## 2022-04-30 ENCOUNTER — Ambulatory Visit: Payer: 59 | Admitting: Orthopaedic Surgery

## 2022-04-30 VITALS — BP 133/76 | HR 60 | Ht 62.0 in | Wt 201.0 lb

## 2022-04-30 DIAGNOSIS — M0579 Rheumatoid arthritis with rheumatoid factor of multiple sites without organ or systems involvement: Secondary | ICD-10-CM | POA: Insufficient documentation

## 2022-04-30 DIAGNOSIS — Z981 Arthrodesis status: Secondary | ICD-10-CM | POA: Diagnosis not present

## 2022-04-30 DIAGNOSIS — H66002 Acute suppurative otitis media without spontaneous rupture of ear drum, left ear: Secondary | ICD-10-CM | POA: Insufficient documentation

## 2022-04-30 HISTORY — DX: Acute suppurative otitis media without spontaneous rupture of ear drum, left ear: H66.002

## 2022-04-30 NOTE — Telephone Encounter (Signed)
P.A. approved til 04/28/25, sent mychart message

## 2022-04-30 NOTE — Assessment & Plan Note (Signed)
Chronic. Followed with rheumatology for management. Not currently on any medications that need close lab monitoring for this condition. Will continue to follow and provide supportive care in collaboration with specialist.

## 2022-04-30 NOTE — Assessment & Plan Note (Signed)
Chronic. Will obtain labs today to monitor vitamin D and calcium. No alarm sx present at this time. PLAN: Ensure that you are getting some form of exercise daily to help with muscle and bone strength. Walking, stretching, and light weights (can of veggies in each hand) can be great ways to increase this.  Take calcium and vitamin D supplements as directed.

## 2022-04-30 NOTE — Assessment & Plan Note (Signed)
Labs pending. Will determine need for replacement dosing. Given hx of osteopenia, minimal dosing is recommended for protection. Will let patient know recommendations once labs are back.

## 2022-04-30 NOTE — Assessment & Plan Note (Signed)
Left ear tenderness reported with presence of bulging TM and purulent effusion noted on exam. Will send treatment with antibiotic therapy and monitor closely. Recommend restart flonase to help reduce inflammation in the ears and sinuses to help with prevention.

## 2022-04-30 NOTE — Assessment & Plan Note (Signed)
Chronic. No statin therapy at this time. Unable to tolerate atorvastatin. Labs pending. Based on findings may consider starting pravastatin at low doses to see if this is something that she can tolerate given the lower profile of the medication. We can try one day a week dosing and move up as tolerated, if needed.

## 2022-04-30 NOTE — Assessment & Plan Note (Signed)
Chronic.  No alarm symptoms present at this time.  Goal blood pressure less than less than 130/80.  Refills have been provided today.  Labs today to check electrolytes and kidney function.  Recommend orders and follow up as documented in EpicCare, reviewed diet, exercise and weight control, cardiovascular risk and specific lipid/LDL goals reviewed, reviewed medications and side effects in detail, the following changes are made - [place] added for kidney and cardiovascular protection, labs ordered and recent results reviewed with patient. Currently is not followed with cardiology. We will make changes to plan of care as necessary based on lab results. Plan to follow-up in 73month. When refills are needed, we can plan to combine her BP medications into a combo pill.

## 2022-04-30 NOTE — Assessment & Plan Note (Signed)
Chronic. Labs pending. Diet and exercise recommendations provided.

## 2022-04-30 NOTE — Assessment & Plan Note (Signed)
History of CKD with reported concerns of preservation of kidney function. Discussion today on medications that may be helpful for both kidney and cardiovascular health. She is interested in trying this. Order for [place] placed, will likely need PA- patient understands this may take a few days to go through. Will follow up based on labs today.

## 2022-05-01 NOTE — Progress Notes (Signed)
Office Visit Note   [name]           Date of Birth: [birth year]           MRN: [id number] Visit Date: 04/30/2022              Requested by: Isaac Bliss, [name], MD [place],  [place] [address] PCP: Orma Render, NP   Assessment & Plan: Visit Diagnoses:  1. S/P lumbar fusion     Plan: We discussed looking into some workout activity joining the gym, water aerobics etc.  She can follow-up as needed.  Follow-Up Instructions: No follow-ups on file.   Orders:  No orders of the defined types were placed in this encounter.  No orders of the defined types were placed in this encounter.     Procedures: No procedures performed   Clinical Data: No additional findings.   Subjective: Chief Complaint  Patient presents with   Lower Back - Follow-up    HPI 72 year old female returns.  L4-5 TLIF 03/09/2021.  Subcutaneous infection with some wound dehiscence requiring VAC.  Patient had history of significant weight loss for her surgery.  Incisions has completely healed.  She is still losing little weight she complains of numbness in her right foot on the plantar surface.  She uses a cane when she ambulates.  No bowel bladder associated symptoms.  States her back is doing well.  Review of Systems all systems update noncontributory.  Of note is history of rheumatoid arthritis previous left total knee arthroplasty.   Objective: Vital Signs: BP 133/76   Pulse 60   Ht 5' 2"$  (1.575 m)   Wt 201 lb (91.2 kg)   BMI 36.76 kg/m   Physical Exam Constitutional:      Appearance: She is well-developed.  HENT:     Head: Normocephalic.     Right Ear: External ear normal.     Left Ear: External ear normal. There is no impacted cerumen.  Eyes:     Pupils: Pupils are equal, round, and reactive to light.  Neck:     Thyroid: No thyromegaly.     Trachea: No tracheal deviation.  Cardiovascular:     Rate and Rhythm: Normal rate.  Pulmonary:      Effort: Pulmonary effort is normal.  Abdominal:     Palpations: Abdomen is soft.  Musculoskeletal:     Cervical back: No rigidity.  Skin:    General: Skin is warm and dry.  Neurological:     Mental Status: She is alert and oriented to person, place, and time.  Psychiatric:        Behavior: Behavior normal.     Ortho Exam patient is amatory anterior tib EHL is strong.  No peroneal or gastrocsoleus weakness.  Decreased sensation some dorsal some plantar surface of the foot.  Lumbar scar well-healed.  Negative straight leg raising right and left.  Specialty Comments:  No specialty comments available.  Imaging: No results found.   PMFS History: Patient Active Problem List   Diagnosis Date Noted   Rheumatoid arthritis with rheumatoid factor of multiple sites without organ or systems involvement ([place]) 04/30/2022   Non-recurrent acute suppurative otitis media of left ear without spontaneous rupture of tympanic membrane 04/30/2022   Osteopenia 03/03/2022   S/P lumbar fusion 03/24/2021   Degenerative spondylolisthesis 02/19/2021   DJD (degenerative joint disease) of knee 09/08/2020   Status post total left knee replacement 09/08/2020   Chronic pain  syndrome 07/17/2020   Myalgia 07/17/2020   OSA (obstructive sleep apnea) 05/19/2020   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/17/2020   Chronic pain of both shoulders 03/03/2020   Pain in both hands 03/03/2020   Pain in both feet 03/03/2020   DDD (degenerative disc disease), lumbar 02/05/2020   Vitamin D deficiency 01/09/2020   Hyperlipidemia 01/09/2020   CKD (chronic kidney disease) stage 3, GFR 30-59 ml/min ([place]) 01/09/2020   HTN (hypertension)    Morbid obesity ([place])    Gout    Depression    Insomnia    Chronically low serum potassium 04/22/2014   Environmental and seasonal allergies 04/22/2014   Palpitations 04/22/2014   Past Medical History:  Diagnosis Date   Chronic kidney disease    Depression     Fibromyalgia    Gout    HTN (hypertension)    Insomnia    Morbid obesity ([place])    Osteoarthritis    Palpitation    Sleep apnea     Family History  Problem Relation Age of Onset   Hypothyroidism Mother    Hyperlipidemia Other    Kidney failure Father    Dementia Father    Gout Father    Arthritis Father    Prostate cancer Father     Past Surgical History:  Procedure Laterality Date   APPLICATION OF WOUND VAC N/A 03/25/2021   Procedure: APPLICATION OF WOUND VAC;  Surgeon: [name], MD;  Location: [place];  Service: Orthopedics;  Laterality: N/A;   APPLICATION OF WOUND VAC N/A 03/30/2021   Procedure: WOUND VAC CHANGE 12x6x5;  Surgeon: [name], MD;  Location: [place];  Service: Orthopedics;  Laterality: N/A;   INCISION AND DRAINAGE OF WOUND N/A 03/25/2021   Procedure: LUMBAR POST OP INCISION IRRIGATION;  Surgeon: [name], MD;  Location: [place];  Service: Orthopedics;  Laterality: N/A;   JOINT REPLACEMENT     KNEE ARTHROSCOPY     LUMBAR WOUND DEBRIDEMENT N/A 03/30/2021   Procedure: REPEAT LUMBAR WOUND DEBRIDEMENT;  Surgeon: [name], MD;  Location: [place];  Service: Orthopedics;  Laterality: N/A;   right rotator cuff     ROTATOR CUFF REPAIR Left    TOTAL KNEE ARTHROPLASTY Left 09/08/2020   Procedure: LEFT TOTAL KNEE ARTHROPLASTY;  Surgeon: Leandrew Koyanagi, MD;  Location: [place];  Service: Orthopedics;  Laterality: Left;   TUBAL LIGATION     Social History   Occupational History   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

## 2022-05-02 ENCOUNTER — Encounter: Payer: Self-pay | Admitting: Orthopaedic Surgery

## 2022-05-03 ENCOUNTER — Encounter: Payer: Self-pay | Admitting: Orthopedic Surgery

## 2022-05-05 ENCOUNTER — Ambulatory Visit: Payer: 59

## 2022-05-05 ENCOUNTER — Encounter: Payer: 59 | Admitting: Psychology

## 2022-05-05 DIAGNOSIS — M25511 Pain in right shoulder: Secondary | ICD-10-CM

## 2022-05-05 DIAGNOSIS — F321 Major depressive disorder, single episode, moderate: Secondary | ICD-10-CM

## 2022-05-05 DIAGNOSIS — G8929 Other chronic pain: Secondary | ICD-10-CM

## 2022-05-05 DIAGNOSIS — G629 Polyneuropathy, unspecified: Secondary | ICD-10-CM

## 2022-05-05 DIAGNOSIS — M545 Low back pain, unspecified: Secondary | ICD-10-CM | POA: Diagnosis not present

## 2022-05-05 DIAGNOSIS — M25512 Pain in left shoulder: Secondary | ICD-10-CM

## 2022-05-05 DIAGNOSIS — G894 Chronic pain syndrome: Secondary | ICD-10-CM

## 2022-05-10 ENCOUNTER — Telehealth: Payer: Self-pay

## 2022-05-10 NOTE — Telephone Encounter (Signed)
PA Submitted for [employer] (Key: [id number])

## 2022-05-18 ENCOUNTER — Ambulatory Visit: Payer: Commercial Managed Care - PPO | Admitting: Physician Assistant

## 2022-05-21 ENCOUNTER — Encounter: Payer: Self-pay | Admitting: Physical Medicine and Rehabilitation

## 2022-05-21 ENCOUNTER — Encounter: Payer: Self-pay | Admitting: Orthopaedic Surgery

## 2022-05-24 ENCOUNTER — Telehealth: Payer: Self-pay

## 2022-05-24 NOTE — Telephone Encounter (Signed)
VOB submitted for [employer], right knee. 

## 2022-05-25 ENCOUNTER — Telehealth: Payer: Self-pay | Admitting: *Deleted

## 2022-05-25 NOTE — Progress Notes (Deleted)
Subjective:    Patient ID: Amy Hooper, female    DOB: 1951/01/24, 72 y.o.   MRN: [id number]  HPI Female with f/u OSA, morbid obesity, hypertension, fibromyalgia, depression, gout, CKD, degenerative disc disease-lumbar, bilateral knee surgeries for torn mensici, bilateral shoulder surgeries for rotator cuff presents with bilateral L > R knee pain and wound-related pain. She presents for follow-up today regarding her food allergies  1) Wound related pain -she is having severe pain associated with her wound vac dressing changes -she has been taking 2 hydrocodone per day and this allows her to function but she states her surgeon is no longer willing to prescribe for her -she asks about alternative pain medications she can take -she says her surgeon says her wound vac may be able to be removed at the end of March.  She cannot sit too long because this aggravates the pain.   2) Knee pain: -she had relief from synvisc -she has terrible pain during the night -patches and the gel do not help.  Initially stated: Started ~1990. After a fall on her knees.  Progressively getting worse.  Steroid injections improve the pain along with brace.  Ambulation exacerbates the pain.  Moving after prolonged postures exacerbates the pain.  Achy.  Radiates down leg.  Intermittent. Denies associated weakness, numbness.  Tylenol. 1 fall summer of 2021 falling backward in chair.  Pain limits ambulation.  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 She has had bilateral L4-5 lumbar epidural steroid injections.  She has also had facet injections.  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  Irritation of S1 nerve roots.  She had a left knee steroid injection on 03/18/2020 with Ortho.  She is seen rheumatology as well.  No reviewed from rheumatology, orthopedic surgeon, physiatrist,  neurology-plan for knee replacement in December 2022. She does note that she is improving with weight loss and dietary changes.   3) Hand pain -spiked since she has been off Celebrex due to her kidney injury -she was recommended to take Qunidine but she can't take because of the antibiotics which she is on for a year due to her wound in her back.  -percocet does not help with the pain as much as the hydrocodone.   4) AKI -improved since off Celebrex  5) Depression:  -she feels miserable.  -she was started on Cymbalta. This did help with the pain in her legs.  -she is not sure if she needs to see anyone for grief  6) Insomnia: -not sleeping well.  -she cannot sleep without Lunesta and is having an issue with this for her insurance -she also takes Amitriptyline, Lyrica, and Cymbalta- when she takes all these she does not get up until 11am the next day.  7) Food allergies -she asks about the results of her food allergy testing   8) Spinal pain and neuropathy -she weaned herself off the amitriptyline as it made her feel like a zombie and now she is more alert    She saw pain management since that time and is scheduled for MBB.  Since last visit, she states she has had great improvement with pool therapy. She states she is hungry. She had good benefits with Lyrica. She has not seen Nephro yet. She is awaiting CPAP.  She has bad fibromyalgia flares when the weather changes- especially when  it rains. She was started on Neurontin by Dr. Posey Pronto and it does help on a daily basis. She had relief with Celebrex well before but had to stop due to CKD. She has never tried steroids.   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   Situational depression: She is going through changes with her family. They are all living in her house and everyone is arguing. She does not want to get herself worked up due to her hypertension. Her family brings her in a lot. Her husband says why don't you say anything. She felt a car  coming across her yesterday and she screamed. She feels overwhelmed. Her mother may have undiagnosed mental illness. Her mom called th police on her husband.   Severe arthritis and bulging discs: -she has a lot of pain and nothing helps it. If she sits too long it hurts and if she walks to long it hurts -Lyrica and steroids help her pain -she is planing to have surgery.   Weight gain  Not voiding as much as she used to -check Creatinine today.    Pain Inventory Average Pain 9 Pain Right Now 8 My pain is constant, burning, stabbing, and aching, stabbing  In the last 24 hours, has pain interfered with the following? General activity 8 Relation with others 9 Enjoyment of life 10 What TIME of day is your pain at its worst? morning , daytime, and evening Sleep (in general) Fair  Pain is worse with: walking, bending, sitting, standing, and some activites Pain improves with: heat/ice and medication Relief from Meds: 7   Family History  Problem Relation Age of Onset   Hypothyroidism Mother    Hyperlipidemia Other    Kidney failure Father    Dementia Father    Gout Father    Arthritis Father    Prostate cancer Father    Social History   Socioeconomic History   Marital status: Married    Spouse name: Not on file   Number of children: 3   Years of education: Not on file   Highest education level: Bachelor's degree (e.g., BA, AB, BS)  Occupational History   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     Social Determinants of Health   Financial Resource Strain: Low Risk  (03/01/2021)   Overall Financial Resource Strain (CARDIA)     Difficulty of Paying Living Expenses: Not hard at all  Food Insecurity: No Food Insecurity (03/01/2021)   Hunger Vital Sign    Worried About Running Out of Food in the Last Year: Never true    Ran Out of Food in the Last Year: Never true  Transportation Needs: No Transportation Needs (03/01/2021)   PRAPARE - Hydrologist (Medical): No    Lack of Transportation (Non-Medical): No  Physical Activity: Unknown (03/01/2021)   Exercise Vital Sign    Days of Exercise per Week: 0 days    Minutes of Exercise per Session: Not on file  Stress: No Stress Concern Present (03/01/2021)   [place]  Questionnaire    Feeling of Stress : Only a little  Social Connections: Moderately Isolated (03/01/2021)   Social Connection and Isolation Panel [NHANES]    Frequency of Communication with Friends and Family: More than three times a week    Frequency of Social Gatherings with Friends and Family: Once a week    Attends Religious Services: Never    Marine scientist or Organizations: No    Attends Music therapist: Not on file    Marital Status: Married   Past Surgical History:  Procedure Laterality Date   APPLICATION OF WOUND VAC N/A 03/25/2021   Procedure: APPLICATION OF WOUND VAC;  Surgeon: [name], MD;  Location: [facility];  Service: Orthopedics;  Laterality: N/A;   APPLICATION OF WOUND VAC N/A 03/30/2021   Procedure: WOUND VAC CHANGE 12x6x5;  Surgeon: [name], MD;  Location: [facility];  Service: Orthopedics;  Laterality: N/A;   INCISION AND DRAINAGE OF WOUND N/A 03/25/2021   Procedure: LUMBAR POST OP INCISION IRRIGATION;  Surgeon: [name], MD;  Location: [facility];  Service: Orthopedics;  Laterality: N/A;   JOINT REPLACEMENT     KNEE ARTHROSCOPY     LUMBAR WOUND DEBRIDEMENT N/A 03/30/2021   Procedure: REPEAT LUMBAR WOUND DEBRIDEMENT;  Surgeon: [name], MD;  Location: [facility];  Service:  Orthopedics;  Laterality: N/A;   right rotator cuff     ROTATOR CUFF REPAIR Left    TOTAL KNEE ARTHROPLASTY Left 09/08/2020   Procedure: LEFT TOTAL KNEE ARTHROPLASTY;  Surgeon: Leandrew Koyanagi, MD;  Location: [place];  Service: Orthopedics;  Laterality: Left;   TUBAL LIGATION     Past Medical History:  Diagnosis Date   Chronic kidney disease    Depression    Fibromyalgia    Gout    HTN (hypertension)    Insomnia    Morbid obesity ([place])    Osteoarthritis    Palpitation    Sleep apnea    There were no vitals taken for this visit.  Opioid Risk Score:   Fall Risk Score:  `1  Depression screen [facility] 2/9     04/22/2022    9:31 AM 03/30/2022   12:54 PM 03/30/2022   12:52 PM 02/01/2022    8:37 AM 12/21/2021    1:31 PM 11/03/2021   10:30 AM 09/02/2021    2:06 PM  Depression screen PHQ 2/9  Decreased Interest 0 1 0 3 1 0 1  Down, Depressed, Hopeless 0 '1  2 1 '$ 0 1  PHQ - 2 Score 0 2 0 5 2 0 2  Altered sleeping    2     Tired, decreased energy    1     Change in appetite    0     Feeling bad or failure about yourself     1     Trouble concentrating    1     Moving slowly or fidgety/restless    0     Suicidal thoughts    0     PHQ-9 Score    10     Difficult doing work/chores    Somewhat difficult      Review of Systems  Constitutional: Negative.   HENT: Negative.    Eyes: Negative.   Respiratory: Negative.    Cardiovascular: Negative.   Gastrointestinal: Negative.   Endocrine: Negative.   Genitourinary: Negative.   Musculoskeletal:  Positive for arthralgias, back pain and gait problem. Negative for  joint swelling, myalgias, neck pain and neck stiffness.       Pain in both legs  Allergic/Immunologic: Negative.   Hematological: Negative.   All other systems reviewed and are negative.     Objective:   Physical Exam  Gen: no distress, normal appearing HEENT: oral mucosa pink and moist, NCAT Cardio: Reg rate Chest: normal effort, normal rate of breathing Abd: soft,  non-distended Ext: no edema Psych: pleasant, normal affect Skin: intact Neuro: Alert and oriented x3    Assessment & Plan:  Female with past medical history/past surgical history of OSA, morbid obesity, hypertension, fibromyalgia, depression, gout, CKD, degenerative disc disease-lumbar, bilateral knee surgeries for torn mensici, bilateral shoulder surgeries for rotator cuff presents with bilateral L > R knee pain.   1. Bilateral knee OA - endstage  Patient was supposed to have knee replacements last year, but due to insurance changed to summer 2022  Endstage OA in b/l knee per Ortho, no films available  No benefit with Heat/Cold, PT (~2019), Robaxin, Flexaril, Tizanidine  Continue bracing prn (limit on right knee - educated)  Continue Voltaren gel  Lidocaine 5% patch ordered  -Provided with a pain relief journal and discussed that it contains foods and lifestyle tips to naturally help to improve pain. Discussed that these lifestyle strategies are also very good for health unlike some medications which can have negative side effects. Discussed that the act of keeping a journal can be therapeutic and helpful to realize patterns what helps to trigger and alleviate pain.    Decreased Cymbalta to '20mg'$  daily.   Patient states main goal is to ambulate  Wants to buy a pool - afraid of public locations due to CoVid  Recommended follow up with Nephro regarding Chondroitin sulfate, appointment next month  Tolerating mild exercise - limited by back   Continue antiinflammatory diet -Discussed following foods that may reduce pain: 1) Ginger (especially studied for arthritis)- reduce leukotriene production to decrease inflammation 2) Blueberries- high in phytonutrients that decrease inflammation 3) Salmon- marine omega-3s reduce joint swelling and pain 4) Pumpkin seeds- reduce inflammation 5) dark chocolate- reduces inflammation 6) turmeric- reduces inflammation 7) tart cherries - reduce pain and  stiffness 8) extra virgin olive oil - its compound olecanthal helps to block prostaglandins  9) chili peppers- can be eaten or applied topically via capsaicin 10) mint- helpful for headache, muscle aches, joint pain, and itching 11) garlic- reduces inflammation  Link to further information on diet for chronic pain: http://www.randall.com/   2. Sleep disturbance  Decrease Lunesta to '1mg'$  HS  Continue to await CPAP -Try to go outside near sunrise -Get exercise during the day.  -Turn off all devices an hour before bedtime.  -Teas that can benefit: chamomile, valerian root, Brahmi (Bacopa) -Can consider over the counter melatonin or magnesium glycinate (latter can also help with constipation) -Pistachios naturally increase the production of melatonin  -start amitriptyline '10mg'$  HS    3. Morbid Obesity  Continue follow up with dietitian  Encouraged weight loss  4. Myalgia   Will consider trigger point injections  5. Fibromyalgia  Prescribed aquatic therapy - had great benefit  Encouraged ROM, stretching  Continue Cymbalta  Decrease Lyrica to '50mg'$  TID  6) Spinal surgical hardware infection -discussed plan to wean off Percocet -will continue on one year of antibiotics -Discussed that the hydrocodone was helping with wound related pain which is still horrible. Will get UDS and pain contact today.  -discussed maximizing tylenol which she is already doing  -  avoid NSAIDs due to only one kidney present -discussed wean to one, then no opioids after wound vac removal  7) depression -discussed taking amitriptyline earlier in the evening -continue to follow-up with neuropsych -discussed family stressors -recommended eating saurkraut -discussed kombucha, yogurt, and kefir.  -Discussed her desire to return to work.  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 Discussed that this is a capsaicin patch, stronger than capsaicin cream. Discussed that it is currently approved for diabetic peripheral neuropathy and post-herpetic neuralgia, but that it has also shown benefit in treating other forms of neuropathy. Provided patient with link to site to learn more about the patch: CinemaBonus.fr. Discussed that the patch would be placed in office and benefits usually last 3 months. Discussed that unintended exposure to capsaicin can cause severe irritation of eyes, mucous membranes, respiratory tract, and skin, but that Qutenza is a local treatment and does not have the systemic side effects of other nerve medications. Discussed that there may be pain, itching, erythema, and decreased sensory function associated with the application of Qutenza. Side effects usually subside within 1 week. A cold pack of analgesic medications can help with these side effects. Blood pressure can also be increased due to pain associated with administration of the patch.

## 2022-05-25 NOTE — Telephone Encounter (Signed)
Prior auth initiated with insurance for Lunesta 1 mg #30 via CoverMyMeds. They will not allow 90 day supply.

## 2022-05-26 NOTE — Telephone Encounter (Signed)
As long as you remain covered by your prescription drug plan and there are no changes to your plan benefits, this request is approved from 05/25/2022 to 05/25/2023. When this approval expires, please speak to your doctor about your treatment.

## 2022-05-30 ENCOUNTER — Encounter: Payer: Self-pay | Admitting: Nurse Practitioner

## 2022-05-31 ENCOUNTER — Encounter: Payer: 59 | Admitting: Physical Medicine and Rehabilitation

## 2022-05-31 ENCOUNTER — Telehealth: Payer: Self-pay

## 2022-05-31 NOTE — Telephone Encounter (Signed)
Completed PA form has been faxed to [facility] for [place], right knee at [phone] PA Pending

## 2022-06-01 ENCOUNTER — Other Ambulatory Visit: Payer: Self-pay | Admitting: Physical Medicine and Rehabilitation

## 2022-06-01 ENCOUNTER — Encounter: Payer: Self-pay | Admitting: Physical Medicine and Rehabilitation

## 2022-06-01 MED ORDER — HYDROCODONE-ACETAMINOPHEN 7.5-325 MG PO TABS: 1.0000 | ORAL_TABLET | Freq: Two times a day (BID) | ORAL | 0 refills | Status: DC | PRN

## 2022-06-01 MED ORDER — AMLODIPINE BESYLATE-VALSARTAN 5-160 MG PO TABS: 1.0000 | ORAL_TABLET | Freq: Every day | ORAL | 1 refills | Status: DC

## 2022-06-02 ENCOUNTER — Other Ambulatory Visit: Payer: Self-pay

## 2022-06-02 DIAGNOSIS — M1711 Unilateral primary osteoarthritis, right knee: Secondary | ICD-10-CM

## 2022-06-03 ENCOUNTER — Ambulatory Visit: Payer: 59 | Admitting: Physical Medicine and Rehabilitation

## 2022-06-11 ENCOUNTER — Other Ambulatory Visit: Payer: Self-pay | Admitting: Physical Medicine and Rehabilitation

## 2022-06-11 ENCOUNTER — Ambulatory Visit: Payer: 59 | Admitting: Orthopaedic Surgery

## 2022-06-11 ENCOUNTER — Encounter: Payer: Self-pay | Admitting: Orthopaedic Surgery

## 2022-06-11 DIAGNOSIS — M1711 Unilateral primary osteoarthritis, right knee: Secondary | ICD-10-CM

## 2022-06-11 MED ORDER — HYALURONAN 88 MG/4ML IX SOSY
88.0000 mg | PREFILLED_SYRINGE | INTRA_ARTICULAR | Status: AC | PRN
  Administered 2022-06-11: 88 mg via INTRA_ARTICULAR

## 2022-06-11 NOTE — Progress Notes (Signed)
Office Visit Note   [name]           Date of Birth: 1950-04-16           MRN: [id number] Visit Date: 06/11/2022              Requested by: Orma Render, NP [place],  [place] [address] PCP: Orma Render, NP   Assessment & Plan: Visit Diagnoses:  1. Unilateral primary osteoarthritis, right knee     Plan: Impression is right knee osteoarthritis.  Today, we proceeded with right knee Monovisc injection.  She tolerated this well.  Follow-up as needed. Lot [id number] Expiration date 11/24/2024  Follow-Up Instructions: Return if symptoms worsen or fail to improve.   Orders:  No orders of the defined types were placed in this encounter.  No orders of the defined types were placed in this encounter.     Procedures: Large Joint Inj: R knee on 06/11/2022 9:44 AM Indications: pain Details: 22 G needle  Arthrogram: No  Medications: 88 mg Hyaluronan 88 MG/4ML Outcome: tolerated well, no immediate complications Patient was prepped and draped in the usual sterile fashion.       Clinical Data: No additional findings.   Subjective: Chief Complaint  Patient presents with   Right Knee - Follow-up    Monovisc    HPI patient is a pleasant 72 year old female with underlying right knee osteoarthritis who comes in today for right knee Monovisc injection.  She has had these in the past which have provided approximately 5 months relief.     Objective: Vital Signs: There were no vitals taken for this visit.    Ortho Exam unchanged right knee exam  Specialty Comments:  No specialty comments available.  Imaging: No new imaging   PMFS History: Patient Active Problem List   Diagnosis Date Noted   Rheumatoid arthritis with rheumatoid factor of multiple sites without organ or systems involvement ([place]) 04/30/2022   Non-recurrent acute suppurative otitis media of left ear without spontaneous rupture of tympanic membrane 04/30/2022    Osteopenia 03/03/2022   S/P lumbar fusion 03/24/2021   Degenerative spondylolisthesis 02/19/2021   DJD (degenerative joint disease) of knee 09/08/2020   Status post total left knee replacement 09/08/2020   Chronic pain syndrome 07/17/2020   Myalgia 07/17/2020   OSA (obstructive sleep apnea) 05/19/2020   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/17/2020   Chronic pain of both shoulders 03/03/2020   Pain in both hands 03/03/2020   Pain in both feet 03/03/2020   DDD (degenerative disc disease), lumbar 02/05/2020   Vitamin D deficiency 01/09/2020   Hyperlipidemia 01/09/2020   CKD (chronic kidney disease) stage 3, GFR 30-59 ml/min ([place]) 01/09/2020   HTN (hypertension)    Morbid obesity ([place])    Gout    Depression    Insomnia    Chronically low serum potassium 04/22/2014   Environmental and seasonal allergies 04/22/2014   Palpitations 04/22/2014   Past Medical History:  Diagnosis Date   Chronic kidney disease    Depression    Fibromyalgia    Gout    HTN (hypertension)    Insomnia    Morbid obesity ([place])    Osteoarthritis    Palpitation    Sleep apnea     Family History  Problem Relation Age of Onset   Hypothyroidism Mother    Hyperlipidemia Other    Kidney failure Father    Dementia Father    Gout Father  Arthritis Father    Prostate cancer Father     Past Surgical History:  Procedure Laterality Date   APPLICATION OF WOUND VAC N/A 03/25/2021   Procedure: APPLICATION OF WOUND VAC;  Surgeon: [name], MD;  Location: [place];  Service: Orthopedics;  Laterality: N/A;   APPLICATION OF WOUND VAC N/A 03/30/2021   Procedure: WOUND VAC CHANGE 12x6x5;  Surgeon: [name], MD;  Location: [place];  Service: Orthopedics;  Laterality: N/A;   INCISION AND DRAINAGE OF WOUND N/A 03/25/2021   Procedure: LUMBAR POST OP INCISION IRRIGATION;  Surgeon: [name], MD;  Location: [place];  Service: Orthopedics;  Laterality: N/A;   JOINT REPLACEMENT     KNEE ARTHROSCOPY      LUMBAR WOUND DEBRIDEMENT N/A 03/30/2021   Procedure: REPEAT LUMBAR WOUND DEBRIDEMENT;  Surgeon: [name], MD;  Location: [place];  Service: Orthopedics;  Laterality: N/A;   right rotator cuff     ROTATOR CUFF REPAIR Left    TOTAL KNEE ARTHROPLASTY Left 09/08/2020   Procedure: LEFT TOTAL KNEE ARTHROPLASTY;  Surgeon: Leandrew Koyanagi, MD;  Location: [place];  Service: Orthopedics;  Laterality: Left;   TUBAL LIGATION     Social History   Occupational History   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

## 2022-06-14 ENCOUNTER — Encounter: Payer: Self-pay | Admitting: Psychology

## 2022-06-14 NOTE — Progress Notes (Signed)
Neuropsychological Consultation   Patient:   Amy Hooper   DOB:   [birth year]  MR Number:  [id number]  Location:  [facility] [place], [place] [id number] [facility] [place] [address] Dept: [phone]           Date of Service:   03/25/2022  Location of Service and Individuals present: Today's visit occurred in my outpatient clinic office with the patient myself present.  Start Time:   8 AM End Time:   9 AM  Patient Consent and Confidentiality: Reviewed limits to confidentiality regarding notes been made available in the patient's EMR for referring physician and other appropriate medical review.  Patient consents to visit and records production  Consent for Evaluation and Treatment:  Signed:  Yes Explanation of Privacy Policies:  Signed:  Yes Discussion of Confidentiality Limits:  Yes  Provider/Observer:  Ilean Skill, Psy.D.       Clinical Neuropsychologist       Billing Code/Service: [phone]  Chief Complaint:     Chief Complaint  Patient presents with   Depression   Pain   Back Pain   Sleeping Problem    Reason for Service:    Amy Hooper is a 72 year old female referred for neuropsychological consultation by her treating physiatrist Leeroy Cha, MD due to the development of depressive type symptoms in the setting of chronic pain including extensive arthritis, degenerative disc disease with previous back surgery, neuropathy, bilateral knee OA, hypertension, fibromyalgia, gout, chronic kidney disease, bilateral shoulder surgeries.  Patient reports that she has been experiencing more depression with the loss of 2).  Patient reports that her depressive symptomatology have always been there and more exacerbated with difficulties in her second marriage.  Patient started Zoloft at the time.  She experienced a flatness of affect and medication was changed to  Wellbutrin.  When she completed her divorce things improved but her third husband, who is a [place] veteran developed drug abuse issues and psychiatric issues and she divorced him as well.  Patient reports that she is now on her fourth marriage and is married to a "good person."  Patient's father died in against other family members advised the patient let her mother move in with him after her father died.  Patient reports that her husband tried very hard but the patient's mother was "terrible" and that her mother finally moved out and then the patient's sister moved in.  When this happened the patient's granddaughter also moved in which is creating a lot of stress.  The patient's mother and sister have moved out which have helped the situation but there continues to be a lot of pain with decreased energy etc.  Patient reports that after a recent back surgery that she developed a wound infection and profound weakness and exacerbation of her depression.  Celebrex helped with pain but was impacting her kidneys and she had to stop and was started on opiate-based medications.  Patient reports that she dislikes having to take these medications for her pain but has no option.  She takes 1 in the morning and 1 in the evening.  Patient reports that she did do physical therapy after each of her surgeries but finished aqua therapy in December 2023.  Patient continues to take Valium for stress and pain, Cymbalta and Lunesta for sleep.  While the patient's husband was not present today but the patient reports that he reports that patient has  mood swings, outburst, depression and anxiety and even though she wants to do more than she is able to she gets very upset with her limitations and tends to "take it out on him."  Medical History:   Past Medical History:  Diagnosis Date   Chronic kidney disease    Depression    Fibromyalgia    Gout    HTN (hypertension)    Insomnia    Morbid obesity ([facility])    Osteoarthritis     Palpitation    Sleep apnea          Patient Active Problem List   Diagnosis Date Noted   Rheumatoid arthritis with rheumatoid factor of multiple sites without organ or systems involvement ([place]) 04/30/2022   Non-recurrent acute suppurative otitis media of left ear without spontaneous rupture of tympanic membrane 04/30/2022   Osteopenia 03/03/2022   S/P lumbar fusion 03/24/2021   Degenerative spondylolisthesis 02/19/2021   DJD (degenerative joint disease) of knee 09/08/2020   Status post total left knee replacement 09/08/2020   Chronic pain syndrome 07/17/2020   Myalgia 07/17/2020   OSA (obstructive sleep apnea) 05/19/2020   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/17/2020   Chronic pain of both shoulders 03/03/2020   Pain in both hands 03/03/2020   Pain in both feet 03/03/2020   DDD (degenerative disc disease), lumbar 02/05/2020   Vitamin D deficiency 01/09/2020   Hyperlipidemia 01/09/2020   CKD (chronic kidney disease) stage 3, GFR 30-59 ml/min ([place]) 01/09/2020   HTN (hypertension)    Morbid obesity ([place])    Gout    Depression    Insomnia    Chronically low serum potassium 04/22/2014   Environmental and seasonal allergies 04/22/2014   Palpitations 04/22/2014    Onset and Duration of Symptoms: Depression has been present for an extended period of time and exacerbated by various psychosocial stressors.  Progression of Symptoms: The patient is experienced more pain and depression as her physical status has deteriorated.  Sleep: Patient is been diagnosed with obstructive sleep apnea and uses her CPAP appropriately.  Patient is started taking Lunesta as she has a very hard time going to sleep and her "mind will not stop thinking" about the days events.    Diet Pattern: Patient describes a poor to fair appetite and eats a good healthy diet when she does eat.  Behavioral Observation/Mental Status:   Amy Hooper  presents as a 72 y.o.-year-old Right handed  African American Female who appeared her stated age. her dress was Appropriate and she was Well Groomed and her manners were Appropriate to the situation.  her participation was indicative of Appropriate behaviors.  There were physical disabilities noted.  she displayed an appropriate level of cooperation and motivation.    Interactions:    Active Appropriate  Attention:   within normal limits and attention span and concentration were age appropriate  Memory:   within normal limits; recent and remote memory intact  Visuo-spatial:   within normal limits  Speech (Volume):  normal  Speech:   normal; normal  Thought Process:  Coherent and Relevant  Coherent and Logical  Though Content:  WNL; not suicidal and not homicidal  Orientation:   person, place, time/date, and situation  Judgment:   Good  Planning:   Good  Affect:    Appropriate  Mood:    Dysphoric  Insight:   Good  Intelligence:   high  Marital Status/Living:  Patient was born and raised in [place] along with 2  siblings.  Normal childhood illnesses were noted including measles, mumps, and chickenpox.  No significant developmental issues were noted.  Patient is married to her fourth husband.  Patient has 3 adult daughters with one of her daughters being previously diagnosed with bipolar affective disorder and substance abuse but clean for some time.  Her 73 year old daughter had a stroke last year and her third daughter deals with alcoholism.  Educational and Occupational History:     Highest Level of Education:   Patient completed her bachelor's degree in nursing .  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  Impact of Symptoms on Social Functioning and Interpersonal Relationships: Patient reports that she is unable to do many of the things that she had been able to do in the past because of her severe pain symptoms.   Psychiatric  History:    Abuse/Trauma History: Patient reports some very stressful previous marriage experiences.  Previous Diagnoses: Patient has dealt with depression for some time with no formal diagnosis of major depressive disorder.  She has been treated with various SSRIs through the years as well as Wellbutrin.  Family Med/Psych History:  Family History  Problem Relation Age of Onset   Hypothyroidism Mother    Hyperlipidemia Other    Kidney failure Father    Dementia Father    Gout Father    Arthritis Father    Prostate cancer Father     Impression/DX:   Amy Hooper is a 72 year old female referred for neuropsychological consultation by her treating physiatrist Leeroy Cha, MD due to the development of depressive type symptoms in the setting of chronic pain including extensive arthritis, degenerative disc disease with previous back surgery, neuropathy, bilateral knee OA, hypertension, fibromyalgia, gout, chronic kidney disease, bilateral shoulder surgeries.  Patient reports that she has been experiencing more depression with the loss of 2).  Patient reports that her depressive symptomatology have always been there and more exacerbated with difficulties in her second marriage.  Patient started Zoloft at the time.  She experienced a flatness of affect and medication was changed to Wellbutrin.  When she completed her divorce things improved but her third husband, who is a [place] veteran developed drug abuse issues and psychiatric issues and she divorced him as well.  Patient reports that she is now on her fourth marriage and is married to a "good person."  Patient's father died in against other family members advised the patient let her mother move in with him after her father died.  Patient reports that her husband tried very hard but the patient's mother was "terrible" and that her mother finally moved out and then the patient's sister moved in.  When this happened the patient's  granddaughter also moved in which is creating a lot of stress.  The patient's mother and sister have moved out which have helped the situation but there continues to be a lot of pain with decreased energy etc.  Disposition/Plan:  We have set the patient up for psychotherapeutic interventions because of significant depressive in conjunction with severe chronic pain disorder.  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   Electronically Signed   _______________________ Ilean Skill, Psy.D. Clinical Neuropsychologist

## 2022-06-14 NOTE — Progress Notes (Signed)
Neuropsychology Visit  Patient:  Amy Hooper   DOB: July 02, 1950  MR Number: [id number]  Location: [facility] [place], [place] [id number] [facility] [place] [address] Dept: [phone]  Date of Service: 04/21/2022  Start: 9 AM End: 10 AM  Today's visit was an in person visit that was conducted in my outpatient clinic office with the patient myself present.  Duration of Service: 1 Hour  Provider/Observer:     Edgardo Roys PsyD  Chief Complaint:      Chief Complaint  Patient presents with   Depression   Pain   Back Pain   Sleeping Problem    Reason For Service:      Amy Hooper is a 72 year old female referred for neuropsychological consultation by her treating physiatrist Leeroy Cha, MD due to the development of depressive type symptoms in the setting of chronic pain including extensive arthritis, degenerative disc disease with previous back surgery, neuropathy, bilateral knee OA, hypertension, fibromyalgia, gout, chronic kidney disease, bilateral shoulder surgeries.  Patient reports that she has been experiencing more depression with the loss of 2).  Patient reports that her depressive symptomatology have always been there and more exacerbated with difficulties in her second marriage.  Patient started Zoloft at the time.  She experienced a flatness of affect and medication was changed to Wellbutrin.  When she completed her divorce things improved but her third husband, who is a [place] veteran developed drug abuse issues and psychiatric issues and she divorced him as well.  Patient reports that she is now on her fourth marriage and is married to a "good person."  Patient's father died in against other family members advised the patient let her mother move in with him after her father died.  Patient reports that her husband tried very hard but the patient's  mother was "terrible" and that her mother finally moved out and then the patient's sister moved in.  When this happened the patient's granddaughter also moved in which is creating a lot of stress.  The patient's mother and sister have moved out which have helped the situation but there continues to be a lot of pain with decreased energy etc.   Treatment Interventions:  Cognitive/behavioral psychotherapeutic interventions for recurrent depression with chronic pain condition as well.  Participation Level:   Active  Participation Quality:  Appropriate      Behavioral Observation:  Well Groomed, Alert, and Appropriate.   Current Psychosocial Factors: Patient reports that she is severely limited by her chronic pain conditions making it hard for her to engage in many of her desired activities.  Patient reports that this tends to create stress within her marriage as she is aware that she tends to take her frustrations out on her husband.  Content of Session:   Reviewed current symptoms and continue to work on therapeutic interventions for recurrent depression and chronic pain symptoms.  Effectiveness of Interventions: Patient was actively engaged in the therapeutic process.  Target Goals:   Target goals including building better coping skills and strategies around managing her chronic pain and recurrent depressive symptomatology.  Working on cognitive behavioral therapies around chronic pain and recurrent depression.  Goals Last Reviewed:   04/21/2022  Goals Addressed Today:    Today we worked on coping skills around chronic pain and depressive symptoms.  Impression/Diagnosis:   Amy Hooper is a 72 year old female referred for neuropsychological consultation by her treating physiatrist  Leeroy Cha, MD due to the development of depressive type symptoms in the setting of chronic pain including extensive arthritis, degenerative disc disease with previous back surgery, neuropathy, bilateral  knee OA, hypertension, fibromyalgia, gout, chronic kidney disease, bilateral shoulder surgeries.  Patient reports that she has been experiencing more depression with the loss of 2).  Patient reports that her depressive symptomatology have always been there and more exacerbated with difficulties in her second marriage.  Patient started Zoloft at the time.  She experienced a flatness of affect and medication was changed to Wellbutrin.  When she completed her divorce things improved but her third husband, who is a [place] veteran developed drug abuse issues and psychiatric issues and she divorced him as well.  Patient reports that she is now on her fourth marriage and is married to a "good person."  Patient's father died in against other family members advised the patient let her mother move in with him after her father died.  Patient reports that her husband tried very hard but the patient's mother was "terrible" and that her mother finally moved out and then the patient's sister moved in.  When this happened the patient's granddaughter also moved in which is creating a lot of stress.  The patient's mother and sister have moved out which have helped the situation but there continues to be a lot of pain with decreased energy etc.   Diagnosis:   Current mild episode of major depressive disorder without prior episode (HCC)  Chronic pain syndrome  Chronic bilateral low back pain without sciatica  Neuropathy  Fibromyalgia  OSA (obstructive sleep apnea)  DDD (degenerative disc disease), lumbar    Ilean Skill, Psy.D. Clinical Psychologist Neuropsychologist

## 2022-06-14 NOTE — Progress Notes (Signed)
Neuropsychological Consultation   Patient:   Amy Hooper   DOB:   09/29/1950  MR Number:  [id number]  Location:  [facility] [place], [id number] [place] [address] Dept: [phone]           Date of Service:   05/05/2022  Location of Service and Individuals present: Today's visit occurred in my outpatient clinic office with the patient myself present.  Start Time:   8 AM End Time:   9 AM  Patient Consent and Confidentiality: Reviewed limits to confidentiality regarding notes been made available in the patient's EMR for referring physician and other appropriate medical review.  Patient consents to visit and records production  Consent for Evaluation and Treatment:  Signed:  Yes Explanation of Privacy Policies:  Signed:  Yes Discussion of Confidentiality Limits:  Yes  Provider/Observer:  Ilean Skill, Psy.D.       Clinical Neuropsychologist       Billing Code/Service: [phone]  Chief Complaint:     Chief Complaint  Patient presents with   Sleeping Problem   Pain   Back Pain   Depression    Reason for Service:    Amy Hooper is a 72 year old female referred for neuropsychological consultation by her treating physiatrist Leeroy Cha, MD due to the development of depressive type symptoms in the setting of chronic pain including extensive arthritis, degenerative disc disease with previous back surgery, neuropathy, bilateral knee OA, hypertension, fibromyalgia, gout, chronic kidney disease, bilateral shoulder surgeries.  Patient reports that she has been experiencing more depression with the loss of 2).  Patient reports that her depressive symptomatology have always been there and more exacerbated with difficulties in her second marriage.  Patient started Zoloft at the time.  She experienced a flatness of affect and medication was changed to  Wellbutrin.  When she completed her divorce things improved but her third husband, who is a [place] veteran developed drug abuse issues and psychiatric issues and she divorced him as well.  Patient reports that she is now on her fourth marriage and is married to a "good person."  Patient's father died in against other family members advised the patient let her mother move in with him after her father died.  Patient reports that her husband tried very hard but the patient's mother was "terrible" and that her mother finally moved out and then the patient's sister moved in.  When this happened the patient's granddaughter also moved in which is creating a lot of stress.  The patient's mother and sister have moved out which have helped the situation but there continues to be a lot of pain with decreased energy etc.  Patient reports that after a recent back surgery that she developed a wound infection and profound weakness and exacerbation of her depression.  Celebrex helped with pain but was impacting her kidneys and she had to stop and was started on opiate-based medications.  Patient reports that she dislikes having to take these medications for her pain but has no option.  She takes 1 in the morning and 1 in the evening.  Patient reports that she did do physical therapy after each of her surgeries but finished aqua therapy in December 2023.  Patient continues to take Valium for stress and pain, Cymbalta and Lunesta for sleep.  While the patient's husband was not present today but the patient reports that he reports that patient has  mood swings, outburst, depression and anxiety and even though she wants to do more than she is able to she gets very upset with her limitations and tends to "take it out on him."  Medical History:   Past Medical History:  Diagnosis Date   Chronic kidney disease    Depression    Fibromyalgia    Gout    HTN (hypertension)    Insomnia    Morbid obesity ([place])    Osteoarthritis     Palpitation    Sleep apnea          Patient Active Problem List   Diagnosis Date Noted   Rheumatoid arthritis with rheumatoid factor of multiple sites without organ or systems involvement ([place]) 04/30/2022   Non-recurrent acute suppurative otitis media of left ear without spontaneous rupture of tympanic membrane 04/30/2022   Osteopenia 03/03/2022   S/P lumbar fusion 03/24/2021   Degenerative spondylolisthesis 02/19/2021   DJD (degenerative joint disease) of knee 09/08/2020   Status post total left knee replacement 09/08/2020   Chronic pain syndrome 07/17/2020   Myalgia 07/17/2020   OSA (obstructive sleep apnea) 05/19/2020   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/17/2020   Chronic pain of both shoulders 03/03/2020   Pain in both hands 03/03/2020   Pain in both feet 03/03/2020   DDD (degenerative disc disease), lumbar 02/05/2020   Vitamin D deficiency 01/09/2020   Hyperlipidemia 01/09/2020   CKD (chronic kidney disease) stage 3, GFR 30-59 ml/min ([place]) 01/09/2020   HTN (hypertension)    Morbid obesity ([place])    Gout    Depression    Insomnia    Chronically low serum potassium 04/22/2014   Environmental and seasonal allergies 04/22/2014   Palpitations 04/22/2014    Onset and Duration of Symptoms: Depression has been present for an extended period of time and exacerbated by various psychosocial stressors.  Progression of Symptoms: The patient is experienced more pain and depression as her physical status has deteriorated.  Sleep: Patient is been diagnosed with obstructive sleep apnea and uses her CPAP appropriately.  Patient is started taking Lunesta as she has a very hard time going to sleep and her "mind will not stop thinking" about the days events.    Diet Pattern: Patient describes a poor to fair appetite and eats a good healthy diet when she does eat.  Behavioral Observation/Mental Status:   [name]  presents as a 72 y.o.-year-old Right handed  African American Female who appeared her stated age. her dress was Appropriate and she was Well Groomed and her manners were Appropriate to the situation.  her participation was indicative of Appropriate behaviors.  There were physical disabilities noted.  she displayed an appropriate level of cooperation and motivation.    Interactions:    Active Appropriate  Attention:   within normal limits and attention span and concentration were age appropriate  Memory:   within normal limits; recent and remote memory intact  Visuo-spatial:   within normal limits  Speech (Volume):  normal  Speech:   normal; normal  Thought Process:  Coherent and Relevant  Coherent and Logical  Though Content:  WNL; not suicidal and not homicidal  Orientation:   person, place, time/date, and situation  Judgment:   Good  Planning:   Good  Affect:    Appropriate  Mood:    Dysphoric  Insight:   Good  Intelligence:   high  Marital Status/Living:  Patient was born and raised in [place] along with 2  siblings.  Normal childhood illnesses were noted including measles, mumps, and chickenpox.  No significant developmental issues were noted.  Patient is married to her fourth husband.  Patient has 3 adult daughters with one of her daughters being previously diagnosed with bipolar affective disorder and substance abuse but clean for some time.  Her 9 year old daughter had a stroke last year and her third daughter deals with alcoholism.  Educational and Occupational History:     Highest Level of Education:   Patient completed her bachelor's degree in nursing .  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  Impact of Symptoms on Social Functioning and Interpersonal Relationships: Patient reports that she is unable to do many of the things that she had been able to do in the past because of her severe pain symptoms.   Psychiatric  History:    Abuse/Trauma History: Patient reports some very stressful previous marriage experiences.  Previous Diagnoses: Patient has dealt with depression for some time with no formal diagnosis of major depressive disorder.  She has been treated with various SSRIs through the years as well as Wellbutrin.  Family Med/Psych History:  Family History  Problem Relation Age of Onset   Hypothyroidism Mother    Hyperlipidemia Other    Kidney failure Father    Dementia Father    Gout Father    Arthritis Father    Prostate cancer Father     Impression/DX:   Sundi Kuehl is a 72 year old female referred for neuropsychological consultation by her treating physiatrist Leeroy Cha, MD due to the development of depressive type symptoms in the setting of chronic pain including extensive arthritis, degenerative disc disease with previous back surgery, neuropathy, bilateral knee OA, hypertension, fibromyalgia, gout, chronic kidney disease, bilateral shoulder surgeries.  Patient reports that she has been experiencing more depression with the loss of 2).  Patient reports that her depressive symptomatology have always been there and more exacerbated with difficulties in her second marriage.  Patient started Zoloft at the time.  She experienced a flatness of affect and medication was changed to Wellbutrin.  When she completed her divorce things improved but her third husband, who is a [place] veteran developed drug abuse issues and psychiatric issues and she divorced him as well.  Patient reports that she is now on her fourth marriage and is married to a "good person."  Patient's father died in against other family members advised the patient let her mother move in with him after her father died.  Patient reports that her husband tried very hard but the patient's mother was "terrible" and that her mother finally moved out and then the patient's sister moved in.  When this happened the patient's  granddaughter also moved in which is creating a lot of stress.  The patient's mother and sister have moved out which have helped the situation but there continues to be a lot of pain with decreased energy etc.  Disposition/Plan:  We have set the patient up for psychotherapeutic interventions because of significant depressive in conjunction with severe chronic pain disorder.  Diagnosis:    Current moderate episode of major depressive disorder, unspecified whether recurrent (HCC)  Chronic bilateral low back pain without sciatica  Pain of both shoulder joints  Neuropathy  Chronic pain syndrome        Note: This document was prepared using Dragon voice recognition software and may include unintentional dictation errors.   Electronically  Signed   _______________________ Ilean Skill, Psy.D. Clinical Neuropsychologist

## 2022-06-19 ENCOUNTER — Other Ambulatory Visit: Payer: Self-pay | Admitting: Internal Medicine

## 2022-06-21 NOTE — Telephone Encounter (Signed)
Okay to refill? Patient has appointment with you 4/8. Thanks!

## 2022-06-23 ENCOUNTER — Telehealth: Payer: Self-pay

## 2022-06-23 DIAGNOSIS — M4626 Osteomyelitis of vertebra, lumbar region: Secondary | ICD-10-CM

## 2022-06-23 MED ORDER — LEVOFLOXACIN 500 MG PO TABS: 500.0000 mg | ORAL_TABLET | Freq: Every day | ORAL | 6 refills | Status: DC

## 2022-06-23 NOTE — Telephone Encounter (Signed)
Called [facility], spoke with pharmacist Merrilee Seashore to cancel levofloxacin 250 mg dose. Per Dr. Candiss Norse, patient should be on levofloxacin 500 mg PO daily.   Refill for 500 mg dose sent in.   Beryle Flock, RN

## 2022-06-24 ENCOUNTER — Encounter: Payer: 59 | Attending: Psychology | Admitting: Psychology

## 2022-06-24 DIAGNOSIS — M25511 Pain in right shoulder: Secondary | ICD-10-CM | POA: Diagnosis present

## 2022-06-24 DIAGNOSIS — M1711 Unilateral primary osteoarthritis, right knee: Secondary | ICD-10-CM | POA: Diagnosis present

## 2022-06-24 DIAGNOSIS — F321 Major depressive disorder, single episode, moderate: Secondary | ICD-10-CM | POA: Diagnosis present

## 2022-06-24 DIAGNOSIS — M545 Low back pain, unspecified: Secondary | ICD-10-CM | POA: Insufficient documentation

## 2022-06-24 DIAGNOSIS — M25512 Pain in left shoulder: Secondary | ICD-10-CM | POA: Insufficient documentation

## 2022-06-24 DIAGNOSIS — G8929 Other chronic pain: Secondary | ICD-10-CM | POA: Insufficient documentation

## 2022-06-24 DIAGNOSIS — G629 Polyneuropathy, unspecified: Secondary | ICD-10-CM | POA: Diagnosis present

## 2022-06-24 DIAGNOSIS — G894 Chronic pain syndrome: Secondary | ICD-10-CM | POA: Insufficient documentation

## 2022-06-24 DIAGNOSIS — T847XXS Infection and inflammatory reaction due to other internal orthopedic prosthetic devices, implants and grafts, sequela: Secondary | ICD-10-CM | POA: Diagnosis present

## 2022-06-28 ENCOUNTER — Other Ambulatory Visit: Payer: Self-pay

## 2022-06-28 ENCOUNTER — Ambulatory Visit: Payer: 59 | Admitting: Internal Medicine

## 2022-06-28 VITALS — BP 138/78 | HR 66 | Resp 16 | Ht 64.0 in | Wt 202.0 lb

## 2022-06-28 DIAGNOSIS — M4626 Osteomyelitis of vertebra, lumbar region: Secondary | ICD-10-CM | POA: Diagnosis not present

## 2022-06-28 NOTE — Progress Notes (Signed)
Patient Active Problem List   Diagnosis Date Noted   Rheumatoid arthritis with rheumatoid factor of multiple sites without organ or systems involvement 04/30/2022   Non-recurrent acute suppurative otitis media of left ear without spontaneous rupture of tympanic membrane 04/30/2022   Osteopenia 03/03/2022   S/P lumbar fusion 03/24/2021   Degenerative spondylolisthesis 02/19/2021   DJD (degenerative joint disease) of knee 09/08/2020   Status post total left knee replacement 09/08/2020   Chronic pain syndrome 07/17/2020   Myalgia 07/17/2020   OSA (obstructive sleep apnea) 05/19/2020   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/17/2020   Chronic pain of both shoulders 03/03/2020   Pain in both hands 03/03/2020   Pain in both feet 03/03/2020   DDD (degenerative disc disease), lumbar 02/05/2020   Vitamin D deficiency 01/09/2020   Hyperlipidemia 01/09/2020   CKD (chronic kidney disease) stage 3, GFR 30-59 ml/min 01/09/2020   HTN (hypertension)    Morbid obesity    Gout    Depression    Insomnia    Chronically low serum potassium 04/22/2014   Environmental and seasonal allergies 04/22/2014   Palpitations 04/22/2014    Patient's Medications  New Prescriptions   No medications on file  Previous Medications   ALLOPURINOL (ZYLOPRIM) 100 MG TABLET    TAKE 2 TABLETS BY MOUTH DAILY   AMLODIPINE-VALSARTAN (EXFORGE) 5-160 MG TABLET    Take 1 tablet by mouth daily.   CIPROFLOXACIN-DEXAMETHASONE (CIPRODEX) OTIC SUSPENSION    Place 4 drops into the left ear 2 (two) times daily.   DAPAGLIFLOZIN PROPANEDIOL (FARXIGA) 5 MG TABS TABLET    Take 1 tablet (5 mg total) by mouth daily.   DIAZEPAM (VALIUM) 2 MG TABLET    Take one pill one hour prior to mri and then repeat just prior if needed   DULOXETINE (CYMBALTA) 20 MG CAPSULE    TAKE 1 CAPSULE BY MOUTH DAILY   ESZOPICLONE (LUNESTA) 1 MG TABS TABLET    Take 1 tablet (1 mg total) by mouth at bedtime as needed for sleep. Take  immediately before bedtime   FLUTICASONE (FLONASE) 50 MCG/ACT NASAL SPRAY    Place 1 spray into both nostrils daily as needed for allergies.   HYDROCODONE-ACETAMINOPHEN (NORCO) 7.5-325 MG TABLET    Take 1 tablet by mouth 2 (two) times daily as needed for moderate pain.   HYDROCORTISONE (ANUSOL-HC) 2.5 % RECTAL CREAM    PLACE 1 APPLICATION RECTALLY 2 (TWO) TIMES DAILY.   LEVOFLOXACIN (LEVAQUIN) 500 MG TABLET    Take 1 tablet (500 mg total) by mouth daily.   LORATADINE (CLARITIN) 10 MG TABLET    Take 10 mg by mouth daily as needed for allergies.   PREGABALIN (LYRICA) 75 MG CAPSULE    Take 1 capsule (75 mg total) by mouth 2 (two) times daily.   VITAMIN D, ERGOCALCIFEROL, (DRISDOL) 1.25 MG (50000 UNIT) CAPS CAPSULE    TAKE 1 CAPSULE (50,000 UNITS TOTAL) BY MOUTH EVERY 7 (SEVEN) DAYS  Modified Medications   No medications on file  Discontinued Medications   No medications on file    Subjective:  72 year old female with obesity, CKD, L4-L5 stenosis anterolisthesis status post L4-5 transforaminal lumbar interbody fusion with pedicle instrumentation cage lateral fusion right L5-S1 microdiscectomy on 03/09/2021.  Patient developed drainage from surgical site infection hospitalized underwent I&D(03/25/21) of lumbar incision.  OR cultures positive for Enterobacter aeruginosa, Morganella and Bacteroides fragilis.  She underwent repeat debridement in the OR due to purulent drainage  on 03/30/21. Patient was initially on cefazolin transitioned to cefpime then metronidazole added.  Plan was to complete 2 weeks of IV therapy with cefepime and metronidazole (04/14/21) then transition to levaquin and metro to complete 8 weeks of antibiotics from OR on 03/30/21. She does have retained hardware as suuch will need suppressive antibiotics with metro and levaquin.  Interval 1/24: Changed to levaquin and metronidazole 05/19/21:Wound vac in place. Followed by Dr. Sheila OatsYates(Orthopedics). Pt reports tolerating levaquin and metronidazole.   06/11/21: no new complaints 07/30/21: Presents  due to GI symptoms. Pt has loss of appetitie, loose stools 5-6 times/day. Loose stools started  in the beginning of April. Pt has been taking percocet PRN less often since April. She notices stools are more formed with percocet. Denies, fever, chills, N,V. Pt had been on probiotics, but has not been taking them since mid April.  She is on levaquin 25 mg PO qd for chronic suppression. Seen by Dr. Ophelia CharterYates on 4/26 and wound noted to be completely healed. Wound vac off.  8/15: reprots muscl epain throughout her body. 12/21/21: Pt she has back pain with certain activities. Dneis fever or chills. Reports no issues with tolerating suppresive antibiotics.   Today 06/28/22: Tolerating abx. No diarrhea.  Review of Systems: Review of Systems  All other systems reviewed and are negative.   Past Medical History:  Diagnosis Date   Chronic kidney disease    Depression    Fibromyalgia    Gout    HTN (hypertension)    Insomnia    Morbid obesity (HCC)    Osteoarthritis    Palpitation    Sleep apnea     Social History   Tobacco Use   Smoking status: Never    Passive exposure: Current   Smokeless tobacco: Never  Vaping Use   Vaping Use: Never used  Substance Use Topics   Alcohol use: Never    Alcohol/week: 0.0 standard drinks of alcohol   Drug use: Never    Family History  Problem Relation Age of Onset   Hypothyroidism Mother    Hyperlipidemia Other    Kidney failure Father    Dementia Father    Gout Father    Arthritis Father    Prostate cancer Father     Allergies  Allergen Reactions   Cefuroxime Axetil Rash and Hives   Clarithromycin Rash and Hives    Other reaction(s): Unknown   Nsaids Nausea And Vomiting    Other reaction(s): Unknown   Cefuroxime     Other reaction(s): Unknown   Robaxin [Methocarbamol] Nausea And Vomiting    "It tears my stomach up."   Toradol [Ketorolac Tromethamine] Nausea And Vomiting   Tramadol Nausea And  Vomiting and Other (See Comments)    Upset stomach Other reaction(s): Unknown    Health Maintenance  Topic Date Due   Hepatitis C Screening  Never done   Zoster Vaccines- Shingrix (1 of 2) Never done   COVID-19 Vaccine (4 - 2023-24 season) 11/20/2021   INFLUENZA VACCINE  10/21/2022   MAMMOGRAM  01/01/2023   COLONOSCOPY (Pts [id number] Insurance coverage will need to be confirmed)  01/04/2024   DTaP/Tdap/Td (2 - Td or Tdap) 01/07/2030   Pneumonia Vaccine 4265+ Years old  Completed   DEXA SCAN  Completed   HPV VACCINES  Aged Out    Objective:  There were no vitals filed for this visit. There is no height or weight on file to calculate BMI.  Physical Exam Constitutional:  Appearance: Normal appearance.  HENT:     Head: Normocephalic and atraumatic.     Right Ear: Tympanic membrane normal.     Left Ear: Tympanic membrane normal.     Nose: Nose normal.     Mouth/Throat:     Mouth: Mucous membranes are moist.  Eyes:     Extraocular Movements: Extraocular movements intact.     Conjunctiva/sclera: Conjunctivae normal.     Pupils: Pupils are equal, round, and reactive to light.  Cardiovascular:     Rate and Rhythm: Normal rate and regular rhythm.     Heart sounds: No murmur heard.    No friction rub. No gallop.  Pulmonary:     Effort: Pulmonary effort is normal.     Breath sounds: Normal breath sounds.  Abdominal:     General: Abdomen is flat.     Palpations: Abdomen is soft.  Musculoskeletal:        General: Normal range of motion.     Comments: No tenderness at lumbar scar.  Skin:    General: Skin is warm and dry.  Neurological:     General: No focal deficit present.     Mental Status: She is alert and oriented to person, place, and time.  Psychiatric:        Mood and Affect: Mood normal.     Lab Results Lab Results  Component Value Date   WBC 4.5 04/22/2022   HGB 12.5 04/22/2022   HCT 37.7 04/22/2022   MCV 87 04/22/2022   PLT 282 04/22/2022    Lab  Results  Component Value Date   CREATININE 1.49 (H) 04/22/2022   BUN 19 04/22/2022   NA 141 04/22/2022   K 4.5 04/22/2022   CL 103 04/22/2022   CO2 23 04/22/2022    Lab Results  Component Value Date   ALT 11 04/22/2022   AST 14 04/22/2022   ALKPHOS 104 04/22/2022   BILITOT 0.3 04/22/2022    Lab Results  Component Value Date   CHOL 202 (H) 04/22/2022   HDL 72 04/22/2022   LDLCALC 118 (H) 04/22/2022   TRIG 67 04/22/2022   CHOLHDL 2.8 04/22/2022   No results found for: "LABRPR", "RPRTITER" No results found for: "HIV1RNAQUANT", "HIV1RNAVL", "CD4TABS"   Problem List Items Addressed This Visit   None  Assessment/Plan #Lumbar wound infection with retained hardware SP I&D on 03/25/21  #OR Cx from 03/25/21 +  enterobacter aeruginosa, Morganella morganii,  bacteroides fragilis(beta lactamase positive) #Medication management -Completed 2 weeks of cefepime from OR on 1/24 and started on levaquin 750mg  PO q48h(renally dosed) and  metronidazole 500mg  PO bid(EOT 05/25/21). Then continued on levaquin and metronidazole for chronic suppression. Metronidazole changed to augmentin due to muscle soreness. Normal CK on 8/15 -labs 10/2 3.5 crp, esr 29 -Follow[facility]ia [facility]. Pt is tolerating antibiotics, she noted she may be getting a second surgery for her back. No tenderness note don exam, labs stable as above.  I thin kit ok to trial pt off of PO suppression. I discussed if she has fever, chills, worsening back pain to call clinic and would likely restart antibiotics.  Plan: -Hold augmentin 500mg  pO bid and levaquin 500 per day(increased due to improved renal clearance)  completed one year of suppression -labs today -Follow-up in one month for labs off of abx     Danelle Earthly, MD [facility] 06/28/2022, 9:56 AM   I have personally spent 46  minutes involved in face-to-face and non-face-to-face activities for this patient on the  day of the visit. Professional time spent includes the following activities: Preparing to see the patient (review of tests), Obtaining and/or reviewing separately obtained history (admission/discharge record), Performing a medically appropriate examination and/or evaluation , Ordering medications/tests/procedures, referring and communicating with other health care professionals, Documenting clinical information in the EMR, Independently interpreting results (not separately reported), Communicating results to the patient/family/caregiver, Counseling and educating the patient/family/caregiver and Care coordination (not separately reported).

## 2022-06-29 ENCOUNTER — Encounter: Payer: Self-pay | Admitting: Psychology

## 2022-06-29 LAB — CBC WITH DIFFERENTIAL/PLATELET
Absolute Monocytes: 370 cells/uL (ref 200–950)
Basophils Absolute: 29 cells/uL (ref 0–200)
Basophils Relative: 0.6 %
Eosinophils Absolute: 259 cells/uL (ref 15–500)
Eosinophils Relative: 5.4 %
HCT: 38.6 % (ref 35.0–45.0)
Hemoglobin: 12.5 g/dL (ref 11.7–15.5)
Lymphs Abs: 2266 cells/uL (ref 850–3900)
MCH: 28.1 pg (ref 27.0–33.0)
MCHC: 32.4 g/dL (ref 32.0–36.0)
MCV: 86.7 fL (ref 80.0–100.0)
MPV: 9.8 fL (ref 7.5–12.5)
Monocytes Relative: 7.7 %
Neutro Abs: 1877 cells/uL (ref 1500–7800)
Neutrophils Relative %: 39.1 %
Platelets: 310 10*3/uL (ref 140–400)
RBC: 4.45 10*6/uL (ref 3.80–5.10)
RDW: 13.4 % (ref 11.0–15.0)
Total Lymphocyte: 47.2 %
WBC: 4.8 10*3/uL (ref 3.8–10.8)

## 2022-06-29 LAB — COMPLETE METABOLIC PANEL WITH GFR
AG Ratio: 1.6 (calc) (ref 1.0–2.5)
ALT: 13 U/L (ref 6–29)
AST: 16 U/L (ref 10–35)
Albumin: 4.1 g/dL (ref 3.6–5.1)
Alkaline phosphatase (APISO): 92 U/L (ref 37–153)
BUN/Creatinine Ratio: 13 (calc) (ref 6–22)
BUN: 16 mg/dL (ref 7–25)
CO2: 29 mmol/L (ref 20–32)
Calcium: 9.8 mg/dL (ref 8.6–10.4)
Chloride: 105 mmol/L (ref 98–110)
Creat: 1.24 mg/dL — ABNORMAL HIGH (ref 0.50–1.05)
Globulin: 2.6 g/dL (calc) (ref 1.9–3.7)
Glucose, Bld: 87 mg/dL (ref 65–99)
Potassium: 4.2 mmol/L (ref 3.5–5.3)
Sodium: 140 mmol/L (ref 135–146)
Total Bilirubin: 0.4 mg/dL (ref 0.2–1.2)
Total Protein: 6.7 g/dL (ref 6.1–8.1)
eGFR: 47 mL/min/{1.73_m2} — ABNORMAL LOW (ref >=60)

## 2022-06-29 LAB — SEDIMENTATION RATE: Sed Rate: 29 mm/h (ref 0–30)

## 2022-06-29 LAB — C-REACTIVE PROTEIN: CRP: 2.6 mg/L (ref <8.0)

## 2022-06-29 NOTE — Progress Notes (Signed)
Neuropsychological Consultation   Patient:   Amy Hooper   DOB:   10/18/1950  MR Number:  [id number]  [facility]  [facility] [address] [place] [id number] [facility] [place] [id number] Dept: [phone]           Date of Service:   06/24/2022  Location of Service and Individuals present: Today's visit occurred in my outpatient clinic office with the patient myself present.  Start Time:   8 AM End Time:   9 AM  Patient Consent and Confidentiality: Reviewed limits to confidentiality regarding notes been made available in the patient's EMR for referring physician and other appropriate medical review.  Patient consents to visit and records production  Consent for Evaluation and Treatment:  Signed:  Yes Explanation of Privacy Policies:  Signed:  Yes Discussion of Confidentiality Limits:  Yes  Provider/Observer:  Arley PhenixJohn Lelar Farewell, Psy.D.       Clinical Neuropsychologist       Billing Code/Service: [phone]  Chief Complaint:     Chief Complaint  Patient presents with   Depression   Sleeping Problem   Pain   Back Pain    Reason for Service:    Amy ShropshireRosalyn Cozetta Hotz is a 72 year old female referred for neuropsychological consultation by her treating physiatrist Sula SodaKrutika Raulkar, MD due to the development of depressive type symptoms in the setting of chronic pain including extensive arthritis, degenerative disc disease with previous back surgery, neuropathy, bilateral knee OA, hypertension, fibromyalgia, gout, chronic kidney disease, bilateral shoulder surgeries.  Patient reports that she has been experiencing more depression with the loss of 2).  Patient reports that her depressive symptomatology have always been there and more exacerbated with difficulties in her second marriage.  Patient started Zoloft at the time.  She experienced a flatness of affect and medication was changed to  Wellbutrin.  When she completed her divorce things improved but her third husband, who is a [place] veteran developed drug abuse issues and psychiatric issues and she divorced him as well.  Patient reports that she is now on her fourth marriage and is married to a "good person."  Patient's father died in against other family members advised the patient let her mother move in with him after her father died.  Patient reports that her husband tried very hard but the patient's mother was "terrible" and that her mother finally moved out and then the patient's sister moved in.  When this happened the patient's granddaughter also moved in which is creating a lot of stress.  The patient's mother and sister have moved out which have helped the situation but there continues to be a lot of pain with decreased energy etc.  Patient reports that after a recent back surgery that she developed a wound infection and profound weakness and exacerbation of her depression.  Celebrex helped with pain but was impacting her kidneys and she had to stop and was started on opiate-based medications.  Patient reports that she dislikes having to take these medications for her pain but has no option.  She takes 1 in the morning and 1 in the evening.  Patient reports that she did do physical therapy after each of her surgeries but finished aqua therapy in December 2023.  Patient continues to take Valium for stress and pain, Cymbalta and Lunesta for sleep.  While the patient's husband was not present during the initial clinical interview but the patient reports that he  reports that patient has mood swings, outburst, depression and anxiety and even though she wants to do more than she is able to she gets very upset with her limitations and tends to "take it out on him."  Medical History:   Past Medical History:  Diagnosis Date   Chronic kidney disease    Depression    Fibromyalgia    Gout    HTN (hypertension)    Insomnia    Morbid  obesity    Osteoarthritis    Palpitation    Sleep apnea          Patient Active Problem List   Diagnosis Date Noted   Rheumatoid arthritis with rheumatoid factor of multiple sites without organ or systems involvement 04/30/2022   Non-recurrent acute suppurative otitis media of left ear without spontaneous rupture of tympanic membrane 04/30/2022   Osteopenia 03/03/2022   S/P lumbar fusion 03/24/2021   Degenerative spondylolisthesis 02/19/2021   DJD (degenerative joint disease) of knee 09/08/2020   Status post total left knee replacement 09/08/2020   Chronic pain syndrome 07/17/2020   Myalgia 07/17/2020   OSA (obstructive sleep apnea) 05/19/2020   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/17/2020   Chronic pain of both shoulders 03/03/2020   Pain in both hands 03/03/2020   Pain in both feet 03/03/2020   DDD (degenerative disc disease), lumbar 02/05/2020   Vitamin D deficiency 01/09/2020   Hyperlipidemia 01/09/2020   CKD (chronic kidney disease) stage 3, GFR 30-59 ml/min 01/09/2020   HTN (hypertension)    Morbid obesity    Gout    Depression    Insomnia    Chronically low serum potassium 04/22/2014   Environmental and seasonal allergies 04/22/2014   Palpitations 04/22/2014    Onset and Duration of Symptoms: Depression has been present for an extended period of time and exacerbated by various psychosocial stressors.  Progression of Symptoms: The patient is experienced more pain and depression as her physical status has deteriorated.  Sleep: Patient is been diagnosed with obstructive sleep apnea and uses her CPAP appropriately.  Patient is started taking Lunesta as she has a very hard time going to sleep and her "mind will not stop thinking" about the days events.    Diet Pattern: Patient describes a poor to fair appetite and eats a good healthy diet when she does eat.  Behavioral Observation/Mental Status:   Amy Hooper  presents as a 72 y.o.-year-old  Right handed African American Female who appeared her stated age. her dress was Appropriate and she was Well Groomed and her manners were Appropriate to the situation.  her participation was indicative of Appropriate behaviors.  There were physical disabilities noted.  she displayed an appropriate level of cooperation and motivation.    Interactions:    Active Appropriate  Attention:   within normal limits and attention span and concentration were age appropriate  Memory:   within normal limits; recent and remote memory intact  Visuo-spatial:   within normal limits  Speech (Volume):  normal  Speech:   normal; normal  Thought Process:  Coherent and Relevant  Coherent and Logical  Though Content:  WNL; not suicidal and not homicidal  Orientation:   person, place, time/date, and situation  Judgment:   Good  Planning:   Good  Affect:    Appropriate  Mood:    Dysphoric  Insight:   Good  Intelligence:   high  Marital Status/Living:  Patient was born and raised in [place] along with 2  siblings.  Normal childhood illnesses were noted including measles, mumps, and chickenpox.  No significant developmental issues were noted.  Patient is married to her fourth husband.  Patient has 3 adult daughters with one of her daughters being previously diagnosed with bipolar affective disorder and substance abuse but clean for some time.  Her 42 year old daughter had a stroke last year and her third daughter deals with alcoholism.  Educational and Occupational History:     Highest Level of Education:   Patient completed her bachelor's degree in nursing .  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  Impact of Symptoms on Social Functioning and Interpersonal Relationships: Patient reports that she is unable to do many of the things that she had been able to do in the past because of her severe pain  symptoms.   Psychiatric History:    Abuse/Trauma History: Patient reports some very stressful previous marriage experiences.  Previous Diagnoses: Patient has dealt with depression for some time with no formal diagnosis of major depressive disorder.  She has been treated with various SSRIs through the years as well as Wellbutrin.  Family Med/Psych History:  Family History  Problem Relation Age of Onset   Hypothyroidism Mother    Hyperlipidemia Other    Kidney failure Father    Dementia Father    Gout Father    Arthritis Father    Prostate cancer Father     Impression/DX:   Amy Hooper is a 72 year old female referred for neuropsychological consultation by her treating physiatrist Sula Soda, MD due to the development of depressive type symptoms in the setting of chronic pain including extensive arthritis, degenerative disc disease with previous back surgery, neuropathy, bilateral knee OA, hypertension, fibromyalgia, gout, chronic kidney disease, bilateral shoulder surgeries.  Patient reports that she has been experiencing more depression with the loss of 2).  Patient reports that her depressive symptomatology have always been there and more exacerbated with difficulties in her second marriage.  Patient started Zoloft at the time.  She experienced a flatness of affect and medication was changed to Wellbutrin.  When she completed her divorce things improved but her third husband, who is a [place] veteran developed drug abuse issues and psychiatric issues and she divorced him as well.  Patient reports that she is now on her fourth marriage and is married to a "good person."  Patient's father died in against other family members advised the patient let her mother move in with him after her father died.  Patient reports that her husband tried very hard but the patient's mother was "terrible" and that her mother finally moved out and then the patient's sister moved in.  When this  happened the patient's granddaughter also moved in which is creating a lot of stress.  The patient's mother and sister have moved out which have helped the situation but there continues to be a lot of pain with decreased energy etc.  Disposition/Plan:  Today we worked on coping and adjustment issues around recurrent major depressive episode and chronic pain issues as well as issues around sleep disturbance.  The patient reports that she has been working on some of the therapeutic interventions that we have been developing and behavioral changes particularly issues around sleep.  We also been working on cognitive issues and therapeutic interventions around depression.  Diagnosis:    Current moderate episode of major depressive disorder, unspecified whether recurrent  Chronic bilateral  low back pain without sciatica  Pain of both shoulder joints  Chronic pain syndrome        Note: This document was prepared using Dragon voice recognition software and may include unintentional dictation errors.   Electronically Signed   _______________________ Arley Phenix, Psy.D. Clinical Neuropsychologist

## 2022-07-05 ENCOUNTER — Encounter: Payer: Self-pay | Admitting: Orthopaedic Surgery

## 2022-07-05 ENCOUNTER — Ambulatory Visit: Payer: Commercial Managed Care - PPO | Admitting: Psychology

## 2022-07-05 ENCOUNTER — Other Ambulatory Visit: Payer: Self-pay | Admitting: Orthopaedic Surgery

## 2022-07-05 MED ORDER — PREDNISONE 10 MG (21) PO TBPK: ORAL_TABLET | ORAL | 0 refills | Status: DC

## 2022-07-12 ENCOUNTER — Encounter: Payer: 59 | Admitting: Psychology

## 2022-07-12 ENCOUNTER — Encounter: Payer: Self-pay | Admitting: Internal Medicine

## 2022-07-13 ENCOUNTER — Other Ambulatory Visit: Payer: Self-pay | Admitting: Internal Medicine

## 2022-07-13 DIAGNOSIS — M4626 Osteomyelitis of vertebra, lumbar region: Secondary | ICD-10-CM

## 2022-07-13 NOTE — Progress Notes (Signed)
labs

## 2022-07-15 ENCOUNTER — Telehealth: Payer: Self-pay | Admitting: Orthopaedic Surgery

## 2022-07-15 ENCOUNTER — Encounter: Payer: 59 | Admitting: Physical Medicine and Rehabilitation

## 2022-07-15 VITALS — BP 121/72 | HR 53 | Ht 64.0 in | Wt 201.4 lb

## 2022-07-15 DIAGNOSIS — T847XXS Infection and inflammatory reaction due to other internal orthopedic prosthetic devices, implants and grafts, sequela: Secondary | ICD-10-CM | POA: Diagnosis not present

## 2022-07-15 DIAGNOSIS — M1711 Unilateral primary osteoarthritis, right knee: Secondary | ICD-10-CM

## 2022-07-15 DIAGNOSIS — G629 Polyneuropathy, unspecified: Secondary | ICD-10-CM

## 2022-07-15 DIAGNOSIS — F321 Major depressive disorder, single episode, moderate: Secondary | ICD-10-CM | POA: Diagnosis not present

## 2022-07-15 MED ORDER — CAPSAICIN-CLEANSING GEL 8 % EX KIT
1.0000 | PACK | Freq: Once | CUTANEOUS | Status: AC
Start: 2022-07-15 — End: ?

## 2022-07-15 MED ORDER — HYDROCODONE-ACETAMINOPHEN 7.5-325 MG PO TABS: 1.0000 | ORAL_TABLET | Freq: Two times a day (BID) | ORAL | 0 refills | Status: DC | PRN

## 2022-07-15 NOTE — Telephone Encounter (Signed)
Pt came into office and signed 2 medical release forms for her records to be sent to 2 different dr office. Please call pt when both records have been faxed. Pt phone number is [phone].

## 2022-07-15 NOTE — Progress Notes (Signed)
Subjective:    Patient ID: Amy Hooper, female    DOB: Feb 13, 1951, 72 y.o.   MRN: [id number]  HPI Female with f/u OSA, morbid obesity, right foot neuropathy, hypertension, fibromyalgia, depression, gout, CKD, degenerative disc disease-lumbar, bilateral knee surgeries for torn mensici, bilateral shoulder surgeries for rotator cuff presents with bilateral L > R knee pain and wound-related pain. She presents for follow-up today regarding her food allergies  1) Wound related pain -she is having severe pain associated with her wound vac dressing changes -she has been taking 2 hydrocodone per day and this allows her to function but she states her surgeon is no longer willing to prescribe for her -she asks about alternative pain medications she can take -she says her surgeon says her wound vac may be able to be removed at the end of March.  She cannot sit too long because this aggravates the pain.  -she is following with ID -ID is giving her a break from suppression antibiotics to see if she can get knee surgery  2) Knee pain: -she had relief from synvisc -she has terrible pain during the night -patches and the gel do not help.  Initially stated: Started ~1990. After a fall on her knees.  Progressively getting worse.  Steroid injections improve the pain along with brace.  Ambulation exacerbates the pain.  Moving after prolonged postures exacerbates the pain.  Achy.  Radiates down leg.  Intermittent. Denies associated weakness, numbness.  Tylenol. 1 fall summer of 2021 falling backward in chair.  Pain limits ambulation.  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 She has had bilateral L4-5 lumbar epidural steroid injections.  She has also had facet injections.  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  Irritation of S1 nerve roots.  She had a left knee steroid  injection on 03/18/2020 with Ortho.  She is seen rheumatology as well.  No reviewed from rheumatology, orthopedic surgeon, physiatrist, neurology-plan for knee replacement in December 2022. She does note that she is improving with weight loss and dietary changes.   3) Hand pain -spiked since she has been off Celebrex due to her kidney injury -she was recommended to take Qunidine but she can't take because of the antibiotics which she is on for a year due to her wound in her back.  -percocet does not help with the pain as much as the hydrocodone.   4) AKI -improved since off Celebrex  5) Depression:  -she feels miserable.  -she was started on Cymbalta. This did help with the pain in her legs.  -she is not sure if she needs to see anyone for grief  6) Insomnia: -not sleeping well.  -she cannot sleep without Lunesta and is having an issue with this for her insurance -she also takes Amitriptyline, Lyrica, and Cymbalta- when she takes all these she does not get up until 11am the next day.  7) Food allergies -she asks about the results of her food allergy testing   8) Spinal pain and neuropathy -she weaned herself off the amitriptyline as it made her feel like a zombie and now she is more alert -discussed plan for qutenza today -feels like her right foot is frozen, has felt this way since her spine surgery    She saw pain management since that time and is scheduled for MBB.  Since  last visit, she states she has had great improvement with pool therapy. She states she is hungry. She had good benefits with Lyrica. She has not seen Nephro yet. She is awaiting CPAP.  She has bad fibromyalgia flares when the weather changes- especially when it rains. She was started on Neurontin by Dr. Allena Katz and it does help on a daily basis. She had relief with Celebrex well before but had to stop due to CKD. She has never tried steroids.   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   Situational depression: She is going  through changes with her family. They are all living in her house and everyone is arguing. She does not want to get herself worked up due to her hypertension. Her family brings her in a lot. Her husband says why don't you say anything. She felt a car coming across her yesterday and she screamed. She feels overwhelmed. Her mother may have undiagnosed mental illness. Her mom called th police on her husband.   Severe arthritis and bulging discs: -she has a lot of pain and nothing helps it. If she sits too long it hurts and if she walks to long it hurts -Lyrica and steroids help her pain -she is planing to have surgery.   Weight gain  Not voiding as much as she used to -check Creatinine today.    Pain Inventory Average Pain 9 Pain Right Now 8 My pain is constant, burning, stabbing, and aching, stabbing  In the last 24 hours, has pain interfered with the following? General activity 8 Relation with others 9 Enjoyment of life 10 What TIME of day is your pain at its worst? morning , daytime, and evening Sleep (in general) Fair  Pain is worse with: walking, bending, sitting, standing, and some activites Pain improves with: heat/ice and medication Relief from Meds: 7   Family History  Problem Relation Age of Onset   Hypothyroidism Mother    Hyperlipidemia Other    Kidney failure Father    Dementia Father    Gout Father    Arthritis Father    Prostate cancer Father    Social History   Socioeconomic History   Marital status: Married    Spouse name: Not on file   Number of children: 3   Years of education: Not on file   Highest education level: Bachelor's degree (e.g., BA, AB, BS)  Occupational History   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     Social Determinants of Health   Financial Resource Strain: Low Risk  (03/01/2021)   Overall Financial Resource Strain (CARDIA)    Difficulty of Paying Living Expenses: Not hard at all  Food Insecurity: No Food Insecurity (03/01/2021)   Hunger Vital Sign    Worried About Running Out of Food in the Last Year: Never true    Ran Out of Food in the Last Year: Never true  Transportation Needs: No Transportation Needs (03/01/2021)   PRAPARE - Administrator, Civil Service (Medical): No    Lack of Transportation (Non-Medical): No  Physical Activity:  Unknown (03/01/2021)   Exercise Vital Sign    Days of Exercise per Week: 0 days    Minutes of Exercise per Session: Not on file  Stress: No Stress Concern Present (03/01/2021)   [employer] of Occupational Health - Occupational Stress Questionnaire    Feeling of Stress : Only a little  Social Connections: Moderately Isolated (03/01/2021)   Social Connection and Isolation Panel [NHANES]    Frequency of Communication with Friends and Family: More than three times a week    Frequency of Social Gatherings with Friends and Family: Once a week    Attends Religious Services: Never    Database administrator or Organizations: No    Attends Engineer, structural: Not on file    Marital Status: Married   Past Surgical History:  Procedure Laterality Date   APPLICATION OF WOUND VAC N/A 03/25/2021   Procedure: APPLICATION OF WOUND VAC;  Surgeon: Eldred Manges, MD;  Location: [facility] OR;  Service: Orthopedics;  Laterality: N/A;   APPLICATION OF WOUND VAC N/A 03/30/2021   Procedure: WOUND VAC CHANGE 12x6x5;  Surgeon: Eldred Manges, MD;  Location: [facility] OR;  Service: Orthopedics;  Laterality: N/A;   INCISION AND DRAINAGE OF WOUND N/A 03/25/2021   Procedure: LUMBAR POST OP INCISION IRRIGATION;  Surgeon: Eldred Manges, MD;   Location: [facility] OR;  Service: Orthopedics;  Laterality: N/A;   JOINT REPLACEMENT     KNEE ARTHROSCOPY     LUMBAR WOUND DEBRIDEMENT N/A 03/30/2021   Procedure: REPEAT LUMBAR WOUND DEBRIDEMENT;  Surgeon: Eldred Manges, MD;  Location: [facility] OR;  Service: Orthopedics;  Laterality: N/A;   right rotator cuff     ROTATOR CUFF REPAIR Left    TOTAL KNEE ARTHROPLASTY Left 09/08/2020   Procedure: LEFT TOTAL KNEE ARTHROPLASTY;  Surgeon: Tarry Kos, MD;  Location: [facility] OR;  Service: Orthopedics;  Laterality: Left;   TUBAL LIGATION     Past Medical History:  Diagnosis Date   Chronic kidney disease    Depression    Fibromyalgia    Gout    HTN (hypertension)    Insomnia    Morbid obesity    Osteoarthritis    Palpitation    Sleep apnea    BP 121/72   Pulse (!) 53   Ht  (1.626 m)   Wt 201 lb 6.4 oz (91.4 kg)   SpO2 98%   BMI 34.57 kg/m   Opioid Risk Score:   Fall Risk Score:  `1  Depression screen [facility] 2/9     06/28/2022   10:01 AM 04/22/2022    9:31 AM 03/30/2022   12:54 PM 03/30/2022   12:52 PM 02/01/2022    8:37 AM 12/21/2021    1:31 PM 11/03/2021   10:30 AM  Depression screen PH[facility] 2/9  Decreased Interest 0 0 1 0 3 1 0  Down, Depressed, Hopeless 0 0 0  PHQ - 2 Score 0 0 2 0 5 2 0  Altered sleeping     2    Tired, decreased energy     1    Change in appetite     0    Feeling bad or failure about yourself      1    Trouble concentrating     1    Moving slowly or fidgety/restless     0    Suicidal thoughts     0    PHQ-9 Score  10    Difficult doing work/chores     Somewhat difficult     Review of Systems  Constitutional: Negative.   HENT: Negative.    Eyes: Negative.   Respiratory: Negative.    Cardiovascular: Negative.   Gastrointestinal: Negative.   Endocrine: Negative.   Genitourinary: Negative.   Musculoskeletal:  Positive for arthralgias, back pain and gait problem. Negative for joint swelling, myalgias, neck pain and neck stiffness.       Pain in both legs   Allergic/Immunologic: Negative.   Hematological: Negative.   All other systems reviewed and are negative.     Objective:   Physical Exam  Gen: no distress, normal appearing, weight 201 lbs, BMI 34.57 HEENT: oral mucosa pink and moist, NCAT Cardio: Reg rate Chest: normal effort, normal rate of breathing Abd: soft, non-distended Ext: no edema Psych: pleasant, normal affect Skin: no open lesions on right foot Neuro: Alert and oriented x3    Assessment & Plan:  Female with past medical history/past surgical history of OSA, morbid obesity, hypertension, fibromyalgia, depression, gout, CKD, degenerative disc disease-lumbar, bilateral knee surgeries for torn mensici, bilateral shoulder surgeries for rotator cuff presents with bilateral L > R knee pain.   1. Bilateral knee OA - endstage  Discussed plan for knee surgery  Patient was supposed to have knee replacements last year, but due to insurance changed to summer 2022  Endstage OA in b/l knee per Ortho, no films available  No benefit with Heat/Cold, PT (~2019), Robaxin, Flexaril, Tizanidine  Continue bracing prn (limit on right knee - educated)  Continue Voltaren gel  Lidocaine 5% patch ordered  -Provided with a pain relief journal and discussed that it contains foods and lifestyle tips to naturally help to improve pain. Discussed that these lifestyle strategies are also very good for health unlike some medications which can have negative side effects. Discussed that the act of keeping a journal can be therapeutic and helpful to realize patterns what helps to trigger and alleviate pain.    Decreased Cymbalta to 20mg  daily.   Patient states main goal is to ambulate  Wants to buy a pool - afraid of public locations due to [place]  Recommended follow up with Nephro regarding Chondroitin sulfate, appointment next month  Tolerating mild exercise - limited by back   Continue antiinflammatory diet -Discussed following foods that may reduce  pain: 1) Ginger (especially studied for arthritis)- reduce leukotriene production to decrease inflammation 2) Blueberries- high in phytonutrients that decrease inflammation 3) Salmon- marine omega-3s reduce joint swelling and pain 4) Pumpkin seeds- reduce inflammation 5) dark chocolate- reduces inflammation 6) turmeric- reduces inflammation 7) tart cherries - reduce pain and stiffness 8) extra virgin olive oil - its compound olecanthal helps to block prostaglandins  9) chili peppers- can be eaten or applied topically via capsaicin 10) mint- helpful for headache, muscle aches, joint pain, and itching 11) garlic- reduces inflammation  Link to further information on diet for chronic pain: http://www.bray.com/   2. Sleep disturbance  Decrease Lunesta to 1mg  HS  Continue to await CPAP -Try to go outside near sunrise -Get exercise during the day.  -Turn off all devices an hour before bedtime.  -Teas that can benefit: chamomile, valerian root, Brahmi (Bacopa) -Can consider over the counter melatonin or magnesium glycinate (latter can also help with constipation) -Pistachios naturally increase the production of melatonin  -start amitriptyline 10mg  HS    3. Morbid Obesity  Continue follow up with dietitian  Encouraged weight loss  4. Myalgia   Will consider trigger point injections  5. Fibromyalgia  Prescribed aquatic therapy - had great benefit  Encouraged ROM, stretching  Continue Cymbalta  Decrease Lyrica to 50mg  TID  6) Spinal surgical hardware infection -discussed plan to wean off Percocet -will continue on one year of antibiotics -Discussed that the hydrocodone was helping with wound related pain which is still horrible. Will get UDS and pain contact today.  -discussed maximizing tylenol which she is already doing  -avoid NSAIDs due to only one kidney present -discussed wean to one, then no opioids after  wound vac removal -discussed that she is off suppression antibiotics right now -she has felt increased pan recently, CBC ordered  7) depression -discussed taking amitriptyline earlier in the evening -continue to follow-up with neuropsych -discussed family stressors -recommended eating saurkraut -discussed kombucha, yogurt, and kefir.  -Discussed her desire to return to work.  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 Discussed that this is a capsaicin patch, stronger than capsaicin cream. Discussed that it is currently approved for diabetic peripheral neuropathy and post-herpetic neuralgia, but that it has also shown benefit in treating other forms of neuropathy. Provided patient with link to site to learn more about the patch: https://www.clark.biz/. Discussed that the patch would be placed in office and benefits usually last 3 months. Discussed that unintended exposure to capsaicin can cause severe irritation of eyes, mucous membranes, respiratory tract, and skin, but that Qutenza is a local treatment and does not have the systemic side effects of other nerve medications. Discussed that there may be pain, itching, erythema, and decreased sensory function associated with the application of Qutenza. Side effects usually subside within 1 week. A cold pack of analgesic medications can help with these side effects. Blood pressure can also be increased due to pain associated with administration of the patch.   1 patch of Qutenza was applied to the area of pain. Ice packs were applied during the procedure to ensure patient comfort. Blood pressure was monitored every 15 minutes. The patient  tolerated the procedure well. Post-procedure instructions were given and follow-up has been scheduled.

## 2022-07-16 NOTE — Telephone Encounter (Signed)
IC,lmvm, records have been faxed per the authorizations/requests. Confirmations received.

## 2022-07-23 ENCOUNTER — Ambulatory Visit: Payer: 59 | Admitting: Nurse Practitioner

## 2022-07-23 ENCOUNTER — Encounter: Payer: Self-pay | Admitting: Nurse Practitioner

## 2022-07-23 VITALS — BP 118/70 | HR 67 | Wt 203.0 lb

## 2022-07-23 DIAGNOSIS — E559 Vitamin D deficiency, unspecified: Secondary | ICD-10-CM

## 2022-07-23 DIAGNOSIS — N1831 Chronic kidney disease, stage 3a: Secondary | ICD-10-CM | POA: Diagnosis not present

## 2022-07-23 DIAGNOSIS — J3089 Other allergic rhinitis: Secondary | ICD-10-CM

## 2022-07-23 DIAGNOSIS — R001 Bradycardia, unspecified: Secondary | ICD-10-CM

## 2022-07-23 DIAGNOSIS — M778 Other enthesopathies, not elsewhere classified: Secondary | ICD-10-CM | POA: Insufficient documentation

## 2022-07-23 DIAGNOSIS — M0579 Rheumatoid arthritis with rheumatoid factor of multiple sites without organ or systems involvement: Secondary | ICD-10-CM

## 2022-07-23 DIAGNOSIS — I10 Essential (primary) hypertension: Secondary | ICD-10-CM

## 2022-07-23 DIAGNOSIS — G894 Chronic pain syndrome: Secondary | ICD-10-CM

## 2022-07-23 DIAGNOSIS — M4310 Spondylolisthesis, site unspecified: Secondary | ICD-10-CM

## 2022-07-23 DIAGNOSIS — R21 Rash and other nonspecific skin eruption: Secondary | ICD-10-CM

## 2022-07-23 DIAGNOSIS — E782 Mixed hyperlipidemia: Secondary | ICD-10-CM

## 2022-07-23 DIAGNOSIS — E876 Hypokalemia: Secondary | ICD-10-CM

## 2022-07-23 DIAGNOSIS — M1A09X0 Idiopathic chronic gout, multiple sites, without tophus (tophi): Secondary | ICD-10-CM

## 2022-07-23 DIAGNOSIS — Z981 Arthrodesis status: Secondary | ICD-10-CM

## 2022-07-23 HISTORY — DX: Rash and other nonspecific skin eruption: R21

## 2022-07-23 LAB — COMPREHENSIVE METABOLIC PANEL
AST: 15 IU/L (ref 0–40)
Albumin/Globulin Ratio: 1.6 (ref 1.2–2.2)
CO2: 22 mmol/L (ref 20–29)
Globulin, Total: 2.6 g/dL (ref 1.5–4.5)
Total Protein: 6.7 g/dL (ref 6.0–8.5)

## 2022-07-23 LAB — HEMOGLOBIN A1C

## 2022-07-23 LAB — CBC WITH DIFFERENTIAL/PLATELET
MCHC: 32.4 g/dL (ref 31.5–35.7)
MCV: 90 fL (ref 79–97)
Neutrophils Absolute: 2.1 10*3/uL (ref 1.4–7.0)
Neutrophils: 40 %

## 2022-07-23 LAB — LIPID PANEL: Triglycerides: 101 mg/dL (ref 0–149)

## 2022-07-23 LAB — SEDIMENTATION RATE: Sed Rate: 34 mm/hr (ref 0–40)

## 2022-07-23 MED ORDER — KETOCONAZOLE 2 % EX CREA
1.0000 | TOPICAL_CREAM | Freq: Every day | CUTANEOUS | 0 refills | Status: AC
Start: 2022-07-23 — End: ?

## 2022-07-23 MED ORDER — MOMETASONE FUROATE 0.1 % EX CREA
TOPICAL_CREAM | CUTANEOUS | 1 refills | Status: AC
Start: 2022-07-23 — End: ?

## 2022-07-23 NOTE — Progress Notes (Signed)
Amy Clamp, DNP, AGNP-c [facility]  [address] [place], [place] [address] [phone]  ESTABLISHED PATIENT- Chronic Health and/or Follow-Up Visit  Blood pressure 118/70, pulse 67, weight 203 lb (92.1 kg).    Amy Hooper is a 72 y.o. year old female presenting today for evaluation and management of chronic conditions.   The patient presents today with concerns of stuffiness and worsening allergy symptoms. She reports that her allergies had improved earlier this week, but this morning they worsened significantly.   The patient describes a recent increase in lower back pain and sciatic pain radiating down her leg. She experienced an episode of severe shooting pain from her back to her ankle after using a massager on her back. The patient is considering seeing a neurologist for further evaluation. She has a history of back surgery with Dr. Ophelia Charter, but reports concerns for ongoing back pain after the surgery. She is getting a second opinion from [facility] for pain.  The patient has a history of infection in her hardware and has been on antibiotics for suppression. She is unable to have her knee surgery until she is off antibiotics for two months. The medication is on hold at this time so she can prepare for surgery.   She also reports occasional flare-ups of gout in her big toe, despite not consuming any known triggers.   The patient has a history of severe tendonitis in her wrist and elbow due to typing and phone use, as well as arthritis in multiple joints. She takes hydrocodone twice daily for pain management.  The patient is now on Farxiga to help her kidneys. She reports that her kidney function has improved slightly. Her blood pressure is well-controlled, but she experienced an isolated incident of a pulse in the 50s. She also had a single episode of shortness of breath that resolved upon stopping physical activity.  The patient has a recurring  itchy rash on her face, which she believes may be related to exposure to dirt. The rash flares up periodically and is currently raised on her forehead. This has been present on and off for about 2 months. It does itch.   The patient also reports a headache today, which she attributes to her allergy symptoms.  All ROS negative with exception of what is listed above.   PHYSICAL EXAM Physical Exam Vitals and nursing note reviewed.  Constitutional:      Appearance: Normal appearance.  HENT:     Head: Normocephalic.  Eyes:     Pupils: Pupils are equal, round, and reactive to light.  Neck:     Vascular: No carotid bruit.  Cardiovascular:     Rate and Rhythm: Normal rate and regular rhythm.     Pulses: Normal pulses.     Heart sounds: Murmur heard.  Pulmonary:     Effort: Pulmonary effort is normal.     Breath sounds: Normal breath sounds.  Abdominal:     General: Bowel sounds are normal.     Palpations: Abdomen is soft.  Musculoskeletal:        General: Tenderness present.     Cervical back: Normal range of motion. No tenderness.     Comments: Lumbar tenderness present with sciatic pain radiating into the feet.   Lymphadenopathy:     Cervical: No cervical adenopathy.  Skin:    General: Skin is warm and dry.     Capillary Refill: Capillary refill takes less than 2 seconds.     Findings:  Rash present.     Comments: Erythematous rash noted on the forehead. Plaques present without scaling.   Neurological:     General: No focal deficit present.     Mental Status: She is alert and oriented to person, place, and time.     Motor: Weakness present.  Psychiatric:        Mood and Affect: Mood normal.     PLAN Problem List Items Addressed This Visit     HTN (hypertension) - Primary    Chronic hypertension currently managed with amlodipine-valsartan with excellent control. No concerns at this time. Continue current regimen. Labs pending today.       Relevant Medications    ketoconazole (NIZORAL) 2 % cream   mometasone (ELOCON) 0.1 % cream   Other Relevant Orders   Hemoglobin A1c (Completed)   CBC with Differential/Platelet (Completed)   Comprehensive metabolic panel (Completed)   VITAMIN D 25 Hydroxy (Vit-D Deficiency, Fractures) (Completed)   TSH (Completed)   Lipid panel (Completed)   Uric Acid (Completed)   C-reactive protein (Completed)   Sedimentation Rate (Completed)   Morbid obesity (HCC)    Chronic. Labs pending. Diet and exercise recommendations provided.      Relevant Medications   ketoconazole (NIZORAL) 2 % cream   mometasone (ELOCON) 0.1 % cream   Other Relevant Orders   Hemoglobin A1c (Completed)   CBC with Differential/Platelet (Completed)   Comprehensive metabolic panel (Completed)   VITAMIN D 25 Hydroxy (Vit-D Deficiency, Fractures) (Completed)   TSH (Completed)   Lipid panel (Completed)   Uric Acid (Completed)   C-reactive protein (Completed)   Sedimentation Rate (Completed)   Gout    Currently on allopurinol for management. No alarm symptoms are present at this time. Will obtain uric acid levels for monitoring.       Relevant Medications   ketoconazole (NIZORAL) 2 % cream   mometasone (ELOCON) 0.1 % cream   Other Relevant Orders   Hemoglobin A1c (Completed)   CBC with Differential/Platelet (Completed)   Comprehensive metabolic panel (Completed)   VITAMIN D 25 Hydroxy (Vit-D Deficiency, Fractures) (Completed)   TSH (Completed)   Lipid panel (Completed)   Uric Acid (Completed)   C-reactive protein (Completed)   Sedimentation Rate (Completed)   Vitamin D deficiency    Labs pending. Will determine need for replacement dosing. Given hx of osteopenia, minimal dosing is recommended for protection. Will let patient know recommendations once labs are back.       Relevant Medications   ketoconazole (NIZORAL) 2 % cream   mometasone (ELOCON) 0.1 % cream   Other Relevant Orders   Hemoglobin A1c (Completed)   CBC with  Differential/Platelet (Completed)   Comprehensive metabolic panel (Completed)   VITAMIN D 25 Hydroxy (Vit-D Deficiency, Fractures) (Completed)   TSH (Completed)   Lipid panel (Completed)   Uric Acid (Completed)   C-reactive protein (Completed)   Sedimentation Rate (Completed)   Hyperlipidemia    Chronic. Not currently on statin therapy. Declines at this time. Will monitor labs today to evaluate control. Strongly encourage consideration of therapy if labs are elevated due to significant increase in risks of further kidney and cardiovascular dysfunction in the setting of elevated lipids due to risks of decreased blood flow.       Relevant Medications   ketoconazole (NIZORAL) 2 % cream   mometasone (ELOCON) 0.1 % cream   Other Relevant Orders   Hemoglobin A1c (Completed)   CBC with Differential/Platelet (Completed)   Comprehensive metabolic panel (Completed)  VITAMIN D 25 Hydroxy (Vit-D Deficiency, Fractures) (Completed)   TSH (Completed)   Lipid panel (Completed)   Uric Acid (Completed)   C-reactive protein (Completed)   Sedimentation Rate (Completed)   CKD (chronic kidney disease) stage 3, GFR 30-59 ml/min (HCC)    Currently on farxiga for protection. Recent labs did show improvement of kidney function. Followed with urology. No alarm symptoms present today. Continue to maintain hydration and notify if new symptoms develop. Labs pending.       Relevant Medications   ketoconazole (NIZORAL) 2 % cream   mometasone (ELOCON) 0.1 % cream   Other Relevant Orders   Hemoglobin A1c (Completed)   CBC with Differential/Platelet (Completed)   Comprehensive metabolic panel (Completed)   VITAMIN D 25 Hydroxy (Vit-D Deficiency, Fractures) (Completed)   TSH (Completed)   Lipid panel (Completed)   Uric Acid (Completed)   C-reactive protein (Completed)   Sedimentation Rate (Completed)   Chronic pain syndrome   Degenerative spondylolisthesis    Previous surgery for repair has not resulted in  improvement of symptoms. Continued sciatic pain with radiation into the feet. Seeking evaluation with [facility]. Recommend continue with this therapy      Relevant Medications   ketoconazole (NIZORAL) 2 % cream   mometasone (ELOCON) 0.1 % cream   Other Relevant Orders   Hemoglobin A1c (Completed)   CBC with Differential/Platelet (Completed)   Comprehensive metabolic panel (Completed)   VITAMIN D 25 Hydroxy (Vit-D Deficiency, Fractures) (Completed)   TSH (Completed)   Lipid panel (Completed)   Uric Acid (Completed)   C-reactive protein (Completed)   Sedimentation Rate (Completed)   S/P lumbar fusion    Symptoms not well controlled at this time. Currently seeking care with [facility] for evaluation and management. Will continue to provide supportive care in collaboration.       Relevant Medications   ketoconazole (NIZORAL) 2 % cream   mometasone (ELOCON) 0.1 % cream   Other Relevant Orders   Hemoglobin A1c (Completed)   CBC with Differential/Platelet (Completed)   Comprehensive metabolic panel (Completed)   VITAMIN D 25 Hydroxy (Vit-D Deficiency, Fractures) (Completed)   TSH (Completed)   Lipid panel (Completed)   Uric Acid (Completed)   C-reactive protein (Completed)   Sedimentation Rate (Completed)   Chronically low serum potassium    Labs pending today for evaluation for need of replacement.       Relevant Medications   ketoconazole (NIZORAL) 2 % cream   mometasone (ELOCON) 0.1 % cream   Other Relevant Orders   Hemoglobin A1c (Completed)   CBC with Differential/Platelet (Completed)   Comprehensive metabolic panel (Completed)   VITAMIN D 25 Hydroxy (Vit-D Deficiency, Fractures) (Completed)   TSH (Completed)   Lipid panel (Completed)   Uric Acid (Completed)   C-reactive protein (Completed)   Sedimentation Rate (Completed)   Environmental and seasonal allergies    Chronic allergic symptoms with intermittent exacerbation. Recommend daily use of allergy medication  with loratadine, cetirizine, or levocitirizine and fluticasone nasal spray to aid in control.       Rheumatoid arthritis with rheumatoid factor of multiple sites without organ or systems involvement (HCC)    Chronic. Followed with rheumatology for management. Not currently on any medications that need close lab monitoring for this condition. Will continue to follow and provide supportive care in collaboration with specialist.       Relevant Medications   ketoconazole (NIZORAL) 2 % cream   mometasone (ELOCON) 0.1 % cream   Other Relevant Orders  Hemoglobin A1c (Completed)   CBC with Differential/Platelet (Completed)   Comprehensive metabolic panel (Completed)   VITAMIN D 25 Hydroxy (Vit-D Deficiency, Fractures) (Completed)   TSH (Completed)   Lipid panel (Completed)   Uric Acid (Completed)   C-reactive protein (Completed)   Sedimentation Rate (Completed)   Forearm tendonitis    Chronic. No new symptoms present at this time. Continue to follow with ortho for evaluation and management      Relevant Medications   ketoconazole (NIZORAL) 2 % cream   mometasone (ELOCON) 0.1 % cream   Other Relevant Orders   Hemoglobin A1c (Completed)   CBC with Differential/Platelet (Completed)   Comprehensive metabolic panel (Completed)   VITAMIN D 25 Hydroxy (Vit-D Deficiency, Fractures) (Completed)   TSH (Completed)   Lipid panel (Completed)   Uric Acid (Completed)   C-reactive protein (Completed)   Sedimentation Rate (Completed)   Rash and nonspecific skin eruption    Erythematous rash noted on forehead. At this time etiology is not completely clear. Possible allergic reaction. Patient expresses concern over dirt exposure, possible fungal etiology considered. Will plan to treat with ketoconazole cream daily and monitor for improvement.       Relevant Medications   ketoconazole (NIZORAL) 2 % cream   mometasone (ELOCON) 0.1 % cream   Other Relevant Orders   Hemoglobin A1c (Completed)   CBC  with Differential/Platelet (Completed)   Comprehensive metabolic panel (Completed)   VITAMIN D 25 Hydroxy (Vit-D Deficiency, Fractures) (Completed)   TSH (Completed)   Lipid panel (Completed)   Uric Acid (Completed)   C-reactive protein (Completed)   Sedimentation Rate (Completed)   Asymptomatic bradycardia    History of bradycardia with no symptoms. HRRR today with no concerns. Unclear etiology at this time. Recommend monitor closely. If symptoms reoccur please report.       Relevant Orders   EKG 12-Lead    No follow-ups on file. Time: 38 minutes, >50% spent counseling, care coordination, chart review, and documentation.    Amy Clamp, DNP, AGNP-c 07/23/2022  8:29 AM

## 2022-07-23 NOTE — Patient Instructions (Addendum)
I do recommend you to speak with the Orthopedic doctor about the tendon pain in the right wrist. In the meantime, rest this as much as possible.   I have sent in 2 creams for the rash. Let me know if this doesn't seem to be clearing in the next 2 weeks.

## 2022-07-24 LAB — COMPREHENSIVE METABOLIC PANEL
ALT: 9 IU/L (ref 0–32)
Albumin: 4.1 g/dL (ref 3.9–4.9)
Alkaline Phosphatase: 121 IU/L (ref 44–121)
BUN/Creatinine Ratio: 12 (ref 12–28)
BUN: 17 mg/dL (ref 8–27)
Bilirubin Total: 0.3 mg/dL (ref 0.0–1.2)
Calcium: 9.8 mg/dL (ref 8.7–10.3)
Chloride: 102 mmol/L (ref 96–106)
Creatinine, Ser: 1.44 mg/dL — ABNORMAL HIGH (ref 0.57–1.00)
Glucose: 87 mg/dL (ref 70–99)
Potassium: 4.8 mmol/L (ref 3.5–5.2)
Sodium: 139 mmol/L (ref 134–144)
eGFR: 39 mL/min/{1.73_m2} — ABNORMAL LOW (ref >59)

## 2022-07-24 LAB — CBC WITH DIFFERENTIAL/PLATELET
Basophils Absolute: 0 10*3/uL (ref 0.0–0.2)
Basos: 1 %
EOS (ABSOLUTE): 0.4 10*3/uL (ref 0.0–0.4)
Eos: 8 %
Hematocrit: 38 % (ref 34.0–46.6)
Hemoglobin: 12.3 g/dL (ref 11.1–15.9)
Immature Grans (Abs): 0 10*3/uL (ref 0.0–0.1)
Immature Granulocytes: 0 %
Lymphocytes Absolute: 2.2 10*3/uL (ref 0.7–3.1)
Lymphs: 42 %
MCH: 29 pg (ref 26.6–33.0)
Monocytes Absolute: 0.5 10*3/uL (ref 0.1–0.9)
Monocytes: 9 %
Platelets: 307 10*3/uL (ref 150–450)
RBC: 4.24 x10E6/uL (ref 3.77–5.28)
RDW: 13.7 % (ref 11.7–15.4)
WBC: 5.2 10*3/uL (ref 3.4–10.8)

## 2022-07-24 LAB — TSH: TSH: 2.6 u[IU]/mL (ref 0.450–4.500)

## 2022-07-24 LAB — URIC ACID: Uric Acid: 3.3 mg/dL (ref 3.0–7.2)

## 2022-07-24 LAB — LIPID PANEL
Chol/HDL Ratio: 3.4 ratio (ref 0.0–4.4)
Cholesterol, Total: 235 mg/dL — ABNORMAL HIGH (ref 100–199)
HDL: 69 mg/dL (ref >39)
LDL Chol Calc (NIH): 148 mg/dL — ABNORMAL HIGH (ref 0–99)
VLDL Cholesterol Cal: 18 mg/dL (ref 5–40)

## 2022-07-24 LAB — HEMOGLOBIN A1C: Est. average glucose Bld gHb Est-mCnc: 108 mg/dL

## 2022-07-24 LAB — C-REACTIVE PROTEIN: CRP: 5 mg/L (ref 0–10)

## 2022-07-24 LAB — VITAMIN D 25 HYDROXY (VIT D DEFICIENCY, FRACTURES): Vit D, 25-Hydroxy: 43.4 ng/mL (ref 30.0–100.0)

## 2022-07-27 ENCOUNTER — Encounter: Payer: 59 | Admitting: Registered Nurse

## 2022-07-30 ENCOUNTER — Other Ambulatory Visit: Payer: Self-pay | Admitting: Physical Medicine and Rehabilitation

## 2022-08-03 ENCOUNTER — Encounter: Payer: Self-pay | Admitting: Nurse Practitioner

## 2022-08-08 ENCOUNTER — Encounter: Payer: Self-pay | Admitting: Nurse Practitioner

## 2022-08-08 NOTE — Assessment & Plan Note (Signed)
Chronic. Followed with rheumatology for management. Not currently on any medications that need close lab monitoring for this condition. Will continue to follow and provide supportive care in collaboration with specialist.  

## 2022-08-08 NOTE — Assessment & Plan Note (Signed)
Erythematous rash noted on forehead. At this time etiology is not completely clear. Possible allergic reaction. Patient expresses concern over dirt exposure, possible fungal etiology considered. Will plan to treat with ketoconazole cream daily and monitor for improvement.

## 2022-08-08 NOTE — Assessment & Plan Note (Signed)
Labs pending today for evaluation for need of replacement.

## 2022-08-08 NOTE — Assessment & Plan Note (Signed)
History of bradycardia with no symptoms. HRRR today with no concerns. Unclear etiology at this time. Recommend monitor closely. If symptoms reoccur please report.

## 2022-08-08 NOTE — Assessment & Plan Note (Signed)
Symptoms not well controlled at this time. Currently seeking care with [facility] for evaluation and management. Will continue to provide supportive care in collaboration.

## 2022-08-08 NOTE — Assessment & Plan Note (Signed)
Chronic. Labs pending. Diet and exercise recommendations provided. 

## 2022-08-08 NOTE — Assessment & Plan Note (Signed)
Currently on allopurinol for management. No alarm symptoms are present at this time. Will obtain uric acid levels for monitoring.

## 2022-08-08 NOTE — Assessment & Plan Note (Signed)
Labs pending. Will determine need for replacement dosing. Given hx of osteopenia, minimal dosing is recommended for protection. Will let patient know recommendations once labs are back.  

## 2022-08-08 NOTE — Assessment & Plan Note (Signed)
Chronic. No new symptoms present at this time. Continue to follow with ortho for evaluation and management

## 2022-08-08 NOTE — Assessment & Plan Note (Signed)
Currently on farxiga for protection. Recent labs did show improvement of kidney function. Followed with urology. No alarm symptoms present today. Continue to maintain hydration and notify if new symptoms develop. Labs pending.

## 2022-08-08 NOTE — Assessment & Plan Note (Signed)
Chronic allergic symptoms with intermittent exacerbation. Recommend daily use of allergy medication with loratadine, cetirizine, or levocitirizine and fluticasone nasal spray to aid in control.

## 2022-08-08 NOTE — Assessment & Plan Note (Signed)
Previous surgery for repair has not resulted in improvement of symptoms. Continued sciatic pain with radiation into the feet. Seeking evaluation with [facility]. Recommend continue with this therapy

## 2022-08-08 NOTE — Assessment & Plan Note (Signed)
Chronic hypertension currently managed with amlodipine-valsartan with excellent control. No concerns at this time. Continue current regimen. Labs pending today.

## 2022-08-08 NOTE — Assessment & Plan Note (Signed)
Chronic. Not currently on statin therapy. Declines at this time. Will monitor labs today to evaluate control. Strongly encourage consideration of therapy if labs are elevated due to significant increase in risks of further kidney and cardiovascular dysfunction in the setting of elevated lipids due to risks of decreased blood flow.

## 2022-08-10 ENCOUNTER — Other Ambulatory Visit: Payer: Self-pay

## 2022-08-10 ENCOUNTER — Ambulatory Visit: Payer: 59 | Admitting: Internal Medicine

## 2022-08-10 VITALS — BP 126/74 | HR 51 | Temp 97.9°F | Ht 62.0 in | Wt 198.0 lb

## 2022-08-10 DIAGNOSIS — M4626 Osteomyelitis of vertebra, lumbar region: Secondary | ICD-10-CM

## 2022-08-10 NOTE — Progress Notes (Signed)
[name]  DOB: 03-14-1951 MRN: [id number] PCP: Tollie Eth, NP    Patient Active Problem List   Diagnosis Date Noted   Forearm tendonitis 07/23/2022   Rash and nonspecific skin eruption 07/23/2022   Asymptomatic bradycardia 07/23/2022   Rheumatoid arthritis with rheumatoid factor of multiple sites without organ or systems involvement (HCC) 04/30/2022   Osteopenia 03/03/2022   Degenerative spondylolisthesis 02/19/2021   Status post total left knee replacement 09/08/2020   OSA (obstructive sleep apnea) 05/19/2020   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/17/2020   Chronic pain syndrome 03/03/2020   S/P lumbar fusion 02/05/2020   Vitamin D deficiency 01/09/2020   Hyperlipidemia 01/09/2020   CKD (chronic kidney disease) stage 3, GFR 30-59 ml/min (HCC) 01/09/2020   HTN (hypertension)    Morbid obesity (HCC)    Gout    Depression    Insomnia    Chronically low serum potassium 04/22/2014   Environmental and seasonal allergies 04/22/2014   Palpitations 04/22/2014     Subjective:  Amy Hooper is a  72 year old female with obesity, CKD, L4-L5 stenosis anterolisthesis status post L4-5 transforaminal lumbar interbody fusion with pedicle instrumentation cage lateral fusion right L5-S1 microdiscectomy on 03/09/2021.  Patient developed drainage from surgical site infection hospitalized underwent I&D(03/25/21) of lumbar incision.  OR cultures positive for Enterobacter aeruginosa, Morganella and Bacteroides fragilis.  She underwent repeat debridement in the OR due to purulent drainage on 03/30/21. Patient was initially on cefazolin transitioned to cefpime then metronidazole added.  Plan was to complete 2 weeks of IV therapy with cefepime and metronidazole (04/14/21) then transition to levaquin and metro to complete 8 weeks of antibiotics from OR on 03/30/21. She does have retained hardware as suuch will need suppressive antibiotics with metro and levaquin.   Interval 1/24: Changed to levaquin and metronidazole 05/19/21:Wound vac in place. Followed by Dr. Sheila Oats). Pt reports tolerating levaquin and metronidazole.  06/11/21: no new complaints 07/30/21: Presents  due to GI symptoms. Pt has loss of appetitie, loose stools 5-6 times/day. Loose stools started  in the beginning of April. Pt has been taking percocet PRN less often since April. She notices stools are more formed with percocet. Denies, fever, chills, N,V. Pt had been on probiotics, but has not been taking them since mid April.  She is on levaquin 25 mg PO qd for chronic suppression. Seen by Dr. Ophelia Charter on 4/26 and wound noted to be completely healed. Wound vac off.  8/15: reprots muscl epain throughout her body. 12/21/21: Pt she has back pain with certain activities. Dneis fever or chills. Reports no issues with tolerating suppresive antibiotics.   06/28/22: Tolerating abx. No diarrhea.  Today  08/10/22: She has been off of abx. Pt has some back pain after gardening. No fevers or chills. Review of Systems  All other systems reviewed and are negative.   Past Medical History:  Diagnosis Date   Chronic kidney disease    Depression    Fibromyalgia    Gout    HTN (hypertension)    Insomnia    Morbid obesity (HCC)    Non-recurrent acute suppurative otitis media of left ear without spontaneous rupture of tympanic membrane 04/30/2022   Osteoarthritis    Palpitation    Sleep apnea     Outpatient Medications Prior to Visit  Medication Sig Dispense Refill   allopurinol (ZYLOPRIM) 100 MG tablet TAKE 2 TABLETS BY MOUTH DAILY 180 tablet 1   amLODipine-valsartan (EXFORGE) 5-160 MG tablet Take 1  tablet by mouth daily. 90 tablet 1   dapagliflozin propanediol (FARXIGA) 5 MG TABS tablet Take 1 tablet (5 mg total) by mouth daily. 90 tablet 3   diazepam (VALIUM) 2 MG tablet Take one pill one hour prior to mri and then repeat just prior if needed 2 tablet 0   DULoxetine (CYMBALTA) 20 MG capsule  TAKE 1 CAPSULE BY MOUTH DAILY 90 capsule 2   eszopiclone (LUNESTA) 1 MG TABS tablet Take 1 tablet (1 mg total) by mouth at bedtime as needed for sleep. Take immediately before bedtime 90 tablet 3   fluticasone (FLONASE) 50 MCG/ACT nasal spray Place 1 spray into both nostrils daily as needed for allergies.     HYDROcodone-acetaminophen (NORCO) 7.5-325 MG tablet Take 1 tablet by mouth 2 (two) times daily as needed for moderate pain. 60 tablet 0   hydrocortisone (ANUSOL-HC) 2.5 % rectal cream PLACE 1 APPLICATION RECTALLY 2 (TWO) TIMES DAILY. 30 g 3   ketoconazole (NIZORAL) 2 % cream Apply 1 Application topically daily. Apply to rash twice a day. If no improvement after 2 weeks, please let provider know. 60 g 0   loratadine (CLARITIN) 10 MG tablet Take 10 mg by mouth daily as needed for allergies.     mometasone (ELOCON) 0.1 % cream Apply very thin layer to rash at bedtime. May use one other time during the day if needed. Stop after 7 days, if still needed may repeat after 3 day break. 45 g 1   pregabalin (LYRICA) 75 MG capsule Take 1 capsule (75 mg total) by mouth 2 (two) times daily. 180 capsule 1   Vitamin D, Ergocalciferol, (DRISDOL) 1.25 MG (50000 UNIT) CAPS capsule TAKE 1 CAPSULE (50,000 UNITS TOTAL) BY MOUTH EVERY 7 (SEVEN) DAYS 4 capsule 1   Facility-Administered Medications Prior to Visit  Medication Dose Route Frequency Provider Last Rate Last Admin   capsaicin topical system 8 % patch 1 patch  1 patch Topical Once Raulkar, Drema Pry, MD         Allergies  Allergen Reactions   Cefuroxime Axetil Rash and Hives   Clarithromycin Rash and Hives    Other reaction(s): Unknown   Nsaids Nausea And Vomiting    Other reaction(s): Unknown   Cefuroxime     Other reaction(s): Unknown   Robaxin [Methocarbamol] Nausea And Vomiting    "It tears my stomach up."   Toradol [Ketorolac Tromethamine] Nausea And Vomiting   Tramadol Nausea And Vomiting and Other (See Comments)    Upset stomach Other  reaction(s): Unknown    Social History   Tobacco Use   Smoking status: Never    Passive exposure: Current   Smokeless tobacco: Never  Vaping Use   Vaping Use: Never used  Substance Use Topics   Alcohol use: Never    Alcohol/week: 0.0 standard drinks of alcohol   Drug use: Never    Family History  Problem Relation Age of Onset   Hypothyroidism Mother    Hyperlipidemia Other    Kidney failure Father    Dementia Father    Gout Father    Arthritis Father    Prostate cancer Father     Objective:  There were no vitals filed for this visit. There is no height or weight on file to calculate BMI.  Physical Exam Constitutional:      Appearance: Normal appearance.  HENT:     Head: Normocephalic and atraumatic.     Right Ear: Tympanic membrane normal.     Left Ear:  Tympanic membrane normal.     Nose: Nose normal.     Mouth/Throat:     Mouth: Mucous membranes are moist.  Eyes:     Extraocular Movements: Extraocular movements intact.     Conjunctiva/sclera: Conjunctivae normal.     Pupils: Pupils are equal, round, and reactive to light.  Cardiovascular:     Rate and Rhythm: Normal rate and regular rhythm.     Heart sounds: No murmur heard.    No friction rub. No gallop.  Pulmonary:     Effort: Pulmonary effort is normal.     Breath sounds: Normal breath sounds.  Abdominal:     General: Abdomen is flat.     Palpations: Abdomen is soft.  Musculoskeletal:        General: Normal range of motion.  Skin:    General: Skin is warm and dry.  Neurological:     General: No focal deficit present.     Mental Status: She is alert and oriented to person, place, and time.  Psychiatric:        Mood and Affect: Mood normal.     Lab Results: Lab Results  Component Value Date   WBC 5.2 07/23/2022   HGB 12.3 07/23/2022   HCT 38.0 07/23/2022   MCV 90 07/23/2022   PLT 307 07/23/2022    Lab Results  Component Value Date   CREATININE 1.44 (H) 07/23/2022   BUN 17 07/23/2022    NA 139 07/23/2022   K 4.8 07/23/2022   CL 102 07/23/2022   CO2 22 07/23/2022    Lab Results  Component Value Date   ALT 9 07/23/2022   AST 15 07/23/2022   ALKPHOS 121 07/23/2022   BILITOT 0.3 07/23/2022     Assessment & Plan:  #Lumbar wound infection with retained hardware SP I&D on 03/25/21  #OR Cx from 03/25/21 +  enterobacter aeruginosa, Morganella morganii,  bacteroides fragilis(beta lactamase positive) #Medication management -Completed 2 weeks of cefepime from OR on 1/24 and started on levaquin 750mg  PO q48h(renally dosed) and  metronidazole 500mg  PO bid(EOT 05/25/21). Then continued on levaquin and metronidazole for chronic suppression. Metronidazole changed to augmentin due to muscle soreness. Normal CK on 8/15 -labs 10/2 3.5 crp, esr 29 -[facility] Pt is tolerating antibiotics, she noted she may be getting a second surgery for her back. No tenderness note don exam, labs stable as above.  I thin kit ok to trial pt off of PO suppression. I discussed if she has fever, chills, worsening back pain to call clinic and would likely restart antibiotics.  - On 06/28/22 stopped augmentin 500mg  PO bid and levaquin 500 per as pt  completed one year of suppression. -5/7 esr and crp stable 34/5 respectively. Pt states she had been gardening and attributes to pain to that. Back wound has healed.  As such will hold off on re-starting suppresion Plan: F/U in one month with labs. Counseled  to call clinic if back pain worsens, pt devlops signs of infection including  but not limited to fevers and chills.  Danelle Earthly, MD [facility]   08/10/22  10:21 AM  I have personally spent 32 minutes involved in face-to-face and non-face-to-face activities for this patient on the day of the visit. Professional time spent includes the following activities: Preparing to see the patient (review of tests), Obtaining and/or reviewing  separately obtained history (admission/discharge record), Performing a medically appropriate examination and/or  evaluation , Ordering medications/tests/procedures, referring and communicating with other health care professionals, Documenting clinical information in the EMR, Independently interpreting results (not separately reported), Communicating results to the patient/family/caregiver, Counseling and educating the patient/family/caregiver and Care coordination (not separately reported).

## 2022-08-13 ENCOUNTER — Encounter: Payer: 59 | Attending: Registered Nurse | Admitting: Registered Nurse

## 2022-08-13 ENCOUNTER — Encounter: Payer: Self-pay | Admitting: Registered Nurse

## 2022-08-13 ENCOUNTER — Telehealth: Payer: Self-pay | Admitting: *Deleted

## 2022-08-13 VITALS — BP 138/75 | HR 54 | Ht 62.0 in | Wt 199.0 lb

## 2022-08-13 DIAGNOSIS — M2550 Pain in unspecified joint: Secondary | ICD-10-CM | POA: Diagnosis present

## 2022-08-13 DIAGNOSIS — R001 Bradycardia, unspecified: Secondary | ICD-10-CM | POA: Diagnosis present

## 2022-08-13 DIAGNOSIS — M5416 Radiculopathy, lumbar region: Secondary | ICD-10-CM | POA: Diagnosis not present

## 2022-08-13 DIAGNOSIS — M797 Fibromyalgia: Secondary | ICD-10-CM

## 2022-08-13 DIAGNOSIS — Z79891 Long term (current) use of opiate analgesic: Secondary | ICD-10-CM | POA: Diagnosis not present

## 2022-08-13 DIAGNOSIS — G894 Chronic pain syndrome: Secondary | ICD-10-CM

## 2022-08-13 DIAGNOSIS — M546 Pain in thoracic spine: Secondary | ICD-10-CM

## 2022-08-13 DIAGNOSIS — M25511 Pain in right shoulder: Secondary | ICD-10-CM

## 2022-08-13 DIAGNOSIS — M1711 Unilateral primary osteoarthritis, right knee: Secondary | ICD-10-CM

## 2022-08-13 DIAGNOSIS — M25512 Pain in left shoulder: Secondary | ICD-10-CM | POA: Diagnosis present

## 2022-08-13 DIAGNOSIS — M5136 Other intervertebral disc degeneration, lumbar region: Secondary | ICD-10-CM | POA: Diagnosis present

## 2022-08-13 DIAGNOSIS — Z5181 Encounter for therapeutic drug level monitoring: Secondary | ICD-10-CM | POA: Diagnosis not present

## 2022-08-13 DIAGNOSIS — M51369 Other intervertebral disc degeneration, lumbar region without mention of lumbar back pain or lower extremity pain: Secondary | ICD-10-CM

## 2022-08-13 MED ORDER — HYDROCODONE-ACETAMINOPHEN 7.5-325 MG PO TABS: 1.0000 | ORAL_TABLET | Freq: Two times a day (BID) | ORAL | 0 refills | Status: DC | PRN

## 2022-08-13 NOTE — Patient Instructions (Signed)
Call PCP regarding your Heart Rate: Today your pulse is 54.

## 2022-08-13 NOTE — Telephone Encounter (Signed)
Amy Hooper ([name] - [phone] HYDROcodone-Acetaminophen 7.5-325MG  tablets Status: PA RequestCreated: May 24th, 2024 [phone]: May 24th, 2024

## 2022-08-13 NOTE — Progress Notes (Signed)
Subjective:    Patient ID: Amy Hooper, female    DOB: 1950/06/18, 72 y.o.   MRN: [id number]  HPI: Amy Hooper is a 72 y.o. female who returns for follow up appointment for chronic pain and medication refill. She states her pain is located in her bilateral shoulders, mid- lower back pain and right knee pain. She also reports generalized joint pain. She rates her pain 5. Her current exercise regime is walking and performing stretching exercises.  Ms. Golay Morphine equivalent is 15.00 MME.   UDS ordered today.     Pain Inventory Average Pain 7 Pain Right Now 5 My pain is burning, dull, and aching  In the last 24 hours, has pain interfered with the following? General activity 9 Relation with others 9 Enjoyment of life 9 What TIME of day is your pain at its worst? morning , daytime, evening, night, and varies Sleep (in general) Fair  Pain is worse with: walking, bending, sitting, inactivity, standing, and some activites Pain improves with: medication, TENS, and injections Relief from Meds: 5  Family History  Problem Relation Age of Onset   Hypothyroidism Mother    Hyperlipidemia Other    Kidney failure Father    Dementia Father    Gout Father    Arthritis Father    Prostate cancer Father    Social History   Socioeconomic History   Marital status: Married    Spouse name: Not on file   Number of children: 3   Years of education: Not on file   Highest education level: Bachelor's degree (e.g., BA, AB, BS)  Occupational History   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     Social Determinants of Health    Financial Resource Strain: Low Risk  (03/01/2021)   Overall Financial Resource Strain (CARDIA)    Difficulty of Paying Living Expenses: Not hard at all  Food Insecurity: No Food Insecurity (03/01/2021)   Hunger Vital Sign    Worried About Running Out of Food in the Last Year: Never true    Ran Out of Food in the Last Year: Never true  Transportation Needs: No Transportation Needs (03/01/2021)   PRAPARE - Administrator, Civil Service (Medical): No    Lack of Transportation (Non-Medical): No  Physical Activity: Unknown (03/01/2021)   Exercise Vital Sign    Days of Exercise per Week: 0 days    Minutes of Exercise per Session: Not on file  Stress: No Stress Concern Present (03/01/2021)   [employer] of Occupational Health - Occupational Stress Questionnaire    Feeling of Stress : Only a little  Social Connections: Moderately Isolated (03/01/2021)   Social Connection and Isolation Panel [NHANES]    Frequency of Communication with Friends and Family: More than three times a week    Frequency of Social Gatherings with Friends and Family: Once a week    Attends Religious Services: Never    Database administrator or Organizations: No    Attends Engineer, structural: Not on file    Marital Status:  Married   Past Surgical History:  Procedure Laterality Date   APPLICATION OF WOUND VAC N/A 03/25/2021   Procedure: APPLICATION OF WOUND VAC;  Surgeon: Eldred Manges, MD;  Location: [facility] OR;  Service: Orthopedics;  Laterality: N/A;   APPLICATION OF WOUND VAC N/A 03/30/2021   Procedure: WOUND VAC CHANGE 12x6x5;  Surgeon: Eldred Manges, MD;  Location: [facility] OR;  Service: Orthopedics;  Laterality: N/A;   INCISION AND DRAINAGE OF WOUND N/A 03/25/2021   Procedure: LUMBAR POST OP INCISION IRRIGATION;  Surgeon: Eldred Manges, MD;  Location: [facility] OR;  Service: Orthopedics;  Laterality: N/A;   JOINT REPLACEMENT     KNEE ARTHROSCOPY     LUMBAR WOUND DEBRIDEMENT N/A 03/30/2021   Procedure:  REPEAT LUMBAR WOUND DEBRIDEMENT;  Surgeon: Eldred Manges, MD;  Location: [facility] OR;  Service: Orthopedics;  Laterality: N/A;   right rotator cuff     ROTATOR CUFF REPAIR Left    TOTAL KNEE ARTHROPLASTY Left 09/08/2020   Procedure: LEFT TOTAL KNEE ARTHROPLASTY;  Surgeon: Tarry Kos, MD;  Location: [facility] OR;  Service: Orthopedics;  Laterality: Left;   TUBAL LIGATION     Past Surgical History:  Procedure Laterality Date   APPLICATION OF WOUND VAC N/A 03/25/2021   Procedure: APPLICATION OF WOUND VAC;  Surgeon: Eldred Manges, MD;  Location: [facility];  Service: Orthopedics;  Laterality: N/A;   APPLICATION OF WOUND VAC N/A 03/30/2021   Procedure: WOUND VAC CHANGE 12x6x5;  Surgeon: Eldred Manges, MD;  Location: [facility];  Service: Orthopedics;  Laterality: N/A;   INCISION AND DRAINAGE OF WOUND N/A 03/25/2021   Procedure: LUMBAR POST OP INCISION IRRIGATION;  Surgeon: Eldred Manges, MD;  Location: [facility];  Service: Orthopedics;  Laterality: N/A;   JOINT REPLACEMENT     KNEE ARTHROSCOPY     LUMBAR WOUND DEBRIDEMENT N/A 03/30/2021   Procedure: REPEAT LUMBAR WOUND DEBRIDEMENT;  Surgeon: Eldred Manges, MD;  Location: [facility];  Service: Orthopedics;  Laterality: N/A;   right rotator cuff     ROTATOR CUFF REPAIR Left    TOTAL KNEE ARTHROPLASTY Left 09/08/2020   Procedure: LEFT TOTAL KNEE ARTHROPLASTY;  Surgeon: Tarry Kos, MD;  Location: [facility];  Service: Orthopedics;  Laterality: Left;   TUBAL LIGATION     Past Medical History:  Diagnosis Date   Chronic kidney disease    Depression    Fibromyalgia    Gout    HTN (hypertension)    Insomnia    Morbid obesity (HCC)    Non-recurrent acute suppurative otitis media of left ear without spontaneous rupture of tympanic membrane 04/30/2022   Osteoarthritis    Palpitation    Sleep apnea    There were no vitals taken for this visit.  Opioid Risk Score:   Fall Risk Score:  `1  Depression screen [facility] 2/9     06/28/2022   10:01 AM 04/22/2022    9:31 AM 03/30/2022   12:54  PM 03/30/2022   12:52 PM 02/01/2022    8:37 AM 12/21/2021    1:31 PM 11/03/2021   10:30 AM  Depression screen PHQ 2/9  Decreased Interest 0 0 1 0 3 1 0  Down, Depressed, Hopeless 0 0 1  2 1  0  PHQ - 2 Score 0 0 2 0 5 2 0  Altered sleeping     2    Tired, decreased energy     1    Change in appetite  0    Feeling bad or failure about yourself      1    Trouble concentrating     1    Moving slowly or fidgety/restless     0    Suicidal thoughts     0    PHQ-9 Score     10    Difficult doing work/chores     Somewhat difficult       Review of Systems  Musculoskeletal:  Positive for back pain.       B/L shoulder LT foot LT knee RT arm RT side  All other systems reviewed and are negative.      Objective:   Physical Exam Vitals and nursing note reviewed.  Constitutional:      Appearance: Normal appearance.  Cardiovascular:     Rate and Rhythm: Normal rate and regular rhythm.     Pulses: Normal pulses.     Heart sounds: Normal heart sounds.  Pulmonary:     Effort: Pulmonary effort is normal.     Breath sounds: Normal breath sounds.  Musculoskeletal:     Cervical back: Normal range of motion and neck supple.     Comments: Normal Muscle Bulk and Muscle Testing Reveals:  Upper Extremities: Full ROM and Muscle Strength 5/5  Thoracic Paraspinal Tenderness: T-7-T-9 Lumbar Paraspinal Tenderness: L-3-L-5 Lower Extremities: Full ROM and Muscle Strength 5/5 Right Lower Extremity Flexion Produces Pain into her Right Patella Arises from chair slowly  Narrow Based  Gait     Skin:    General: Skin is warm and dry.  Neurological:     Mental Status: She is alert and oriented to person, place, and time.  Psychiatric:        Mood and Affect: Mood normal.        Behavior: Behavior normal.         Assessment & Plan:  1, Right Lumbar Radiculitis: Continue Pregabalin. She states she has a scheduled appointment with Neurosurgeon at [facility] on September 01, 2022.  Chronic Bilateral  Low Back Pain: S/P Lumbar Fusion: Dr Ophelia Charter Following. Continue HEP as Tolerated. Continue to Monitor. 08/13/2022 2. Acute Thoracic Back Pain : Refuses ED or Urgent Care evaluation. ID Following. She is awaiting a MRI she reports. Has scheduled appointment with Neurosurgeon. Continue to monitor.  3. Post Operative Infection: ID Following: Continue to Monitor. 08/13/2022 4. Fibromyalgia: Continue HEP as Tolerated. Continue Lyrica. Continue to Monitor. 08/13/2022 5. Primary Osteoarthritis: Left Knee: S/P on 09/08/20: Dr Roda Shutters: LEFT TOTAL KNEE ARTHROPLASTY. Continue HEP as Tolerated. Continue to Monitor.  6. Primary Osteoarthritis Right Knee: Continue HEP as Tolerated. Ortho Following. Continue to Monitor. 08/13/2022 7. Chronic Pain Syndrome: Refilled: Hydrocodone 7.5 mg /325 one tablet twice a day as needed for pain #60. Continue Amitriptyline  We will continue the opioid monitoring program, this consists of regular clinic visits, examinations, urine drug screen, pill counts as well as use of [place] Controlled Substance Reporting system. A 12 month History has been reviewed on the [place] Controlled Substance Reporting System on 08/13/2022 8. Depression: Continue Cymbalta. Continue to Monitor. 08/13/2022 9. Bradycardia: She refuses ED/ Urgent care evaluation. She will F/U with her PCP and Keep Vital Signs Log, she was encouraged to go to ED for Evaluation if any symptoms developed. At this time she is asymptomatic. She verbalizes understanding.    F/U in 2 months

## 2022-08-17 NOTE — Telephone Encounter (Signed)
Approved 08/13/22-02/13/23

## 2022-08-18 ENCOUNTER — Encounter: Payer: Self-pay | Admitting: Nurse Practitioner

## 2022-08-19 ENCOUNTER — Ambulatory Visit
Admission: EM | Admit: 2022-08-19 | Discharge: 2022-08-19 | Disposition: A | Discharge status: Home / Self Care | Payer: 59 | Attending: Family Medicine | Admitting: Family Medicine

## 2022-08-19 ENCOUNTER — Other Ambulatory Visit: Payer: Self-pay

## 2022-08-19 ENCOUNTER — Encounter: Payer: Self-pay | Admitting: Emergency Medicine

## 2022-08-19 DIAGNOSIS — R0789 Other chest pain: Secondary | ICD-10-CM | POA: Diagnosis not present

## 2022-08-19 MED ORDER — TIZANIDINE HCL 4 MG PO TABS: 4.0000 mg | ORAL_TABLET | Freq: Three times a day (TID) | ORAL | 0 refills | Status: DC | PRN

## 2022-08-19 NOTE — ED Triage Notes (Signed)
Patient presents to [facility] for evaution of right sided chest pain shooting through to her back.  States she hs had the same issue before.  She felt something pop when she was doing yoga with hr husband this weekend nd now pain constantly to eht right side but worse with deep inspiration or movement.

## 2022-08-19 NOTE — Discharge Instructions (Signed)
  Take tizanidine 4 mg--1 every 8 hours as needed for muscle spasms; this medication can cause dizziness and sleepiness  If you are not improving then please go have the chest x-ray done.  If you feel worse with more intense chest pain or or shortness of breath.,  Then please proceed to the emergency room

## 2022-08-19 NOTE — ED Provider Notes (Signed)
[name]    CSN: [id number] Arrival date & time: 08/19/22  1115      History   Chief Complaint Chief Complaint  Patient presents with   Chest Pain    HPI Amy Hooper is a 72 y.o. female.    Chest Pain  Here for sharp right-sided chest pain.  It began about 2 days ago when she was doing some yoga at home with her husband.   The only bothers her when she does certain motions.  It bothers her in the right anterior chest and the right posterior chest.  It is not exertional..  She has not had any cough or congestion.  No trauma or fall.  She already takes hydrocodone twice daily and Lyrica twice daily.  She has trouble with the anti-inflammatories, as they cause nausea and vomiting.  She has tolerated tizanidine in the past with good effect   Past Medical History:  Diagnosis Date   Chronic kidney disease    Depression    Fibromyalgia    Gout    HTN (hypertension)    Insomnia    Morbid obesity (HCC)    Non-recurrent acute suppurative otitis media of left ear without spontaneous rupture of tympanic membrane 04/30/2022   Osteoarthritis    Palpitation    Sleep apnea     Patient Active Problem List   Diagnosis Date Noted   Forearm tendonitis 07/23/2022   Rash and nonspecific skin eruption 07/23/2022   Asymptomatic bradycardia 07/23/2022   Rheumatoid arthritis with rheumatoid factor of multiple sites without organ or systems involvement (HCC) 04/30/2022   Osteopenia 03/03/2022   Degenerative spondylolisthesis 02/19/2021   Status post total left knee replacement 09/08/2020   OSA (obstructive sleep apnea) 05/19/2020   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/17/2020   Chronic pain syndrome 03/03/2020   S/P lumbar fusion 02/05/2020   Vitamin D deficiency 01/09/2020   Hyperlipidemia 01/09/2020   CKD (chronic kidney disease) stage 3, GFR 30-59 ml/min (HCC) 01/09/2020   HTN (hypertension)    Morbid obesity (HCC)    Gout     Depression    Insomnia    Chronically low serum potassium 04/22/2014   Environmental and seasonal allergies 04/22/2014   Palpitations 04/22/2014    Past Surgical History:  Procedure Laterality Date   APPLICATION OF WOUND VAC N/A 03/25/2021   Procedure: APPLICATION OF WOUND VAC;  Surgeon: Eldred Manges, MD;  Location: [facility];  Service: Orthopedics;  Laterality: N/A;   APPLICATION OF WOUND VAC N/A 03/30/2021   Procedure: WOUND VAC CHANGE 12x6x5;  Surgeon: Eldred Manges, MD;  Location: [facility];  Service: Orthopedics;  Laterality: N/A;   INCISION AND DRAINAGE OF WOUND N/A 03/25/2021   Procedure: LUMBAR POST OP INCISION IRRIGATION;  Surgeon: Eldred Manges, MD;  Location: [facility];  Service: Orthopedics;  Laterality: N/A;   JOINT REPLACEMENT     KNEE ARTHROSCOPY     LUMBAR WOUND DEBRIDEMENT N/A 03/30/2021   Procedure: REPEAT LUMBAR WOUND DEBRIDEMENT;  Surgeon: Eldred Manges, MD;  Location: [facility];  Service: Orthopedics;  Laterality: N/A;   right rotator cuff     ROTATOR CUFF REPAIR Left    TOTAL KNEE ARTHROPLASTY Left 09/08/2020   Procedure: LEFT TOTAL KNEE ARTHROPLASTY;  Surgeon: Tarry Kos, MD;  Location: [facility];  Service: Orthopedics;  Laterality: Left;   TUBAL LIGATION      OB History   No obstetric history on file.      Home Medications  Prior to Admission medications   Medication Sig Start Date End Date Taking? Authorizing Provider  tiZANidine (ZANAFLEX) 4 MG tablet Take 1 tablet (4 mg total) by mouth every 8 (eight) hours as needed for muscle spasms. 08/19/22  Yes Zenia Resides, MD  allopurinol (ZYLOPRIM) 100 MG tablet TAKE 2 TABLETS BY MOUTH DAILY 03/02/22   Philip Aspen, Limmie Patricia, MD  amLODipine-valsartan (EXFORGE) 5-160 MG tablet Take 1 tablet by mouth daily. 06/01/22   Tollie Eth, NP  dapagliflozin propanediol (FARXIGA) 5 MG TABS tablet Take 1 tablet (5 mg total) by mouth daily. 04/22/22   Tollie Eth, NP  diazepam (VALIUM) 2 MG tablet Take one pill one hour prior to mri  and then repeat just prior if needed 03/23/22   Cristie Hem, PA-C  DULoxetine (CYMBALTA) 20 MG capsule TAKE 1 CAPSULE BY MOUTH DAILY 02/09/22   Jones Bales, NP  eszopiclone (LUNESTA) 1 MG TABS tablet Take 1 tablet (1 mg total) by mouth at bedtime as needed for sleep. Take immediately before bedtime 03/30/22   Raulkar, Drema Pry, MD  fluticasone (FLONASE) 50 MCG/ACT nasal spray Place 1 spray into both nostrils daily as needed for allergies.    [provider]  HYDROcodone-acetaminophen (NORCO) 7.5-325 MG tablet Take 1 tablet by mouth 2 (two) times daily as needed for moderate pain. 08/13/22   Jones Bales, NP  hydrocortisone (ANUSOL-HC) 2.5 % rectal cream PLACE 1 APPLICATION RECTALLY 2 (TWO) TIMES DAILY. 02/03/22   Philip Aspen, Limmie Patricia, MD  ketoconazole (NIZORAL) 2 % cream Apply 1 Application topically daily. Apply to rash twice a day. If no improvement after 2 weeks, please let provider know. 07/23/22   Tollie Eth, NP  loratadine (CLARITIN) 10 MG tablet Take 10 mg by mouth daily as needed for allergies.    [provider]  mometasone (ELOCON) 0.1 % cream Apply very thin layer to rash at bedtime. May use one other time during the day if needed. Stop after 7 days, if still needed may repeat after 3 day break. 07/23/22   Tollie Eth, NP  pregabalin (LYRICA) 75 MG capsule Take 1 capsule (75 mg total) by mouth 2 (two) times daily. 04/29/22   Jones Bales, NP  Vitamin D, Ergocalciferol, (DRISDOL) 1.25 MG (50000 UNIT) CAPS capsule TAKE 1 CAPSULE (50,000 UNITS TOTAL) BY MOUTH EVERY 7 (SEVEN) DAYS 07/31/22   Raulkar, Drema Pry, MD    Family History Family History  Problem Relation Age of Onset   Hypothyroidism Mother    Hyperlipidemia Other    Kidney failure Father    Dementia Father    Gout Father    Arthritis Father    Prostate cancer Father     Social History Social History   Tobacco Use   Smoking status: Never    Passive exposure: Current   Smokeless  tobacco: Never  Vaping Use   Vaping Use: Never used  Substance Use Topics   Alcohol use: Never    Alcohol/week: 0.0 standard drinks of alcohol   Drug use: Never     Allergies   Cefuroxime axetil, Clarithromycin, Nsaids, Cefuroxime, Robaxin [methocarbamol], Toradol [ketorolac tromethamine], and Tramadol   Review of Systems Review of Systems  Cardiovascular:  Positive for chest pain.     Physical Exam Triage Vital Signs ED Triage Vitals [08/19/22 1137]  Enc Vitals Group     BP (!) 152/85     Pulse Rate (!) 57     Resp 18  Temp 98.8 F (37.1 C)     Temp Source Oral     SpO2 95 %     Weight      Height      Head Circumference      Peak Flow      Pain Score      Pain Loc      Pain Edu?      Excl. in GC?    No data found.  Updated Vital Signs BP (!) 152/85 (BP Location: Left Arm)   Pulse (!) 57   Temp 98.8 F (37.1 C) (Oral)   Resp 18   SpO2 95%   Visual Acuity Right Eye Distance:   Left Eye Distance:   Bilateral Distance:    Right Eye Near:   Left Eye Near:    Bilateral Near:     Physical Exam Vitals reviewed.  Constitutional:      General: She is not in acute distress.    Appearance: She is not ill-appearing, toxic-appearing or diaphoretic.  HENT:     Mouth/Throat:     Mouth: Mucous membranes are moist.  Eyes:     Extraocular Movements: Extraocular movements intact.     Conjunctiva/sclera: Conjunctivae normal.     Pupils: Pupils are equal, round, and reactive to light.  Cardiovascular:     Rate and Rhythm: Normal rate and regular rhythm.     Heart sounds: No murmur heard. Pulmonary:     Effort: Pulmonary effort is normal. No respiratory distress.     Breath sounds: No stridor. No wheezing, rhonchi or rales.  Chest:     Chest wall: Tenderness (Right anterior chest at about rib level 4 and he also) present.  Musculoskeletal:     Cervical back: Neck supple.  Lymphadenopathy:     Cervical: No cervical adenopathy.  Skin:    Coloration:  Skin is not jaundiced or pale.  Neurological:     Mental Status: She is alert and oriented to person, place, and time.  Psychiatric:        Behavior: Behavior normal.      UC Treatments / Results  Labs (all labs ordered are listed, but only abnormal results are displayed) Labs Reviewed - No data to display  EKG   Radiology No results found.  Procedures Procedures (including critical care time)  Medications Ordered in UC Medications - No data to display  Initial Impression / Assessment and Plan / UC Course  I have reviewed the triage vital signs and the nursing notes.  Pertinent labs & imaging results that were available during my care of the patient were reviewed by me and considered in my medical decision making (see chart for details).        Tizanidine is sent in to help with her pain.  She asked about a chest x-ray which I do not think is absolutely necessary, but I have ordered it for outpatient if she is not improving.  We do not have x-ray in the building today Final Clinical Impressions(s) / UC Diagnoses   Final diagnoses:  Chest wall pain     Discharge Instructions       Take tizanidine 4 mg--1 every 8 hours as needed for muscle spasms; this medication can cause dizziness and sleepiness  If you are not improving then please go have the chest x-ray done.  If you feel worse with more intense chest pain or or shortness of breath.,  Then please proceed to the emergency room  ED Prescriptions     Medication Sig Dispense Auth. Provider   tiZANidine (ZANAFLEX) 4 MG tablet Take 1 tablet (4 mg total) by mouth every 8 (eight) hours as needed for muscle spasms. 15 tablet Celia Gibbons, Janace Aris, MD      I have reviewed the PDMP during this encounter.   Zenia Resides, MD 08/19/22 [phone]

## 2022-08-20 LAB — TOXASSURE SELECT,+ANTIDEPR,UR

## 2022-08-20 NOTE — Telephone Encounter (Signed)
Amy Hooper (Amy Hooper) - [id number] HYDROcodone-Acetaminophen 7.5-325MG  tablets Status: PA Response - ApprovedCreated: May 24th, 2024 [phone]

## 2022-08-23 ENCOUNTER — Other Ambulatory Visit: Payer: Self-pay | Admitting: Physical Medicine and Rehabilitation

## 2022-08-26 ENCOUNTER — Telehealth: Payer: Self-pay | Admitting: *Deleted

## 2022-08-26 NOTE — Telephone Encounter (Signed)
Urine drug screen for this encounter is consistent for prescribed medication 

## 2022-09-06 ENCOUNTER — Encounter: Payer: 59 | Attending: Registered Nurse | Admitting: Registered Nurse

## 2022-09-06 ENCOUNTER — Encounter: Payer: Self-pay | Admitting: Registered Nurse

## 2022-09-06 VITALS — BP 131/79 | HR 58 | Ht 62.0 in | Wt 200.6 lb

## 2022-09-06 DIAGNOSIS — M545 Low back pain, unspecified: Secondary | ICD-10-CM | POA: Diagnosis present

## 2022-09-06 DIAGNOSIS — M1711 Unilateral primary osteoarthritis, right knee: Secondary | ICD-10-CM | POA: Diagnosis present

## 2022-09-06 DIAGNOSIS — M5136 Other intervertebral disc degeneration, lumbar region: Secondary | ICD-10-CM | POA: Insufficient documentation

## 2022-09-06 DIAGNOSIS — M2550 Pain in unspecified joint: Secondary | ICD-10-CM | POA: Insufficient documentation

## 2022-09-06 DIAGNOSIS — R001 Bradycardia, unspecified: Secondary | ICD-10-CM | POA: Diagnosis present

## 2022-09-06 DIAGNOSIS — Z79891 Long term (current) use of opiate analgesic: Secondary | ICD-10-CM | POA: Insufficient documentation

## 2022-09-06 DIAGNOSIS — M797 Fibromyalgia: Secondary | ICD-10-CM | POA: Insufficient documentation

## 2022-09-06 DIAGNOSIS — M25511 Pain in right shoulder: Secondary | ICD-10-CM | POA: Diagnosis present

## 2022-09-06 DIAGNOSIS — G894 Chronic pain syndrome: Secondary | ICD-10-CM | POA: Insufficient documentation

## 2022-09-06 DIAGNOSIS — M25512 Pain in left shoulder: Secondary | ICD-10-CM | POA: Diagnosis present

## 2022-09-06 DIAGNOSIS — Z5181 Encounter for therapeutic drug level monitoring: Secondary | ICD-10-CM | POA: Insufficient documentation

## 2022-09-06 DIAGNOSIS — G8929 Other chronic pain: Secondary | ICD-10-CM | POA: Diagnosis present

## 2022-09-06 MED ORDER — HYDROCODONE-ACETAMINOPHEN 7.5-325 MG PO TABS: 1.0000 | ORAL_TABLET | Freq: Two times a day (BID) | ORAL | 0 refills | Status: DC | PRN

## 2022-09-06 NOTE — Progress Notes (Signed)
Subjective:    Patient ID: Amy Hooper, female    DOB: 03-02-1951, 72 y.o.   MRN: [id number]  HPI: Amy Hooper is a 72 y.o. female who returns for follow up appointment for chronic pain and medication refill. She states her pain is located in her bilateral shoulders, lower back pain and right knee pain. She rates her pain 8. Her current exercise regime is walking and performing stretching exercises.  Amy Hooper Morphine equivalent is 15.00 MME.   Last UDS was Performed on 08/13/2022, it was consistent.    Pain Inventory Average Pain 8 Pain Right Now 8 My pain is intermittent, burning, dull, and tingling  In the last 24 hours, has pain interfered with the following? General activity 9 Relation with others 9 Enjoyment of life 9 What TIME of day is your pain at its worst? morning , daytime, evening, night, and varies Sleep (in general) Fair  Pain is worse with: walking, bending, sitting, inactivity, standing, and some activites Pain improves with: rest, heat/ice, pacing activities, medication, TENS, and injections Relief from Meds: 6  Family History  Problem Relation Age of Onset   Hypothyroidism Mother    Hyperlipidemia Other    Kidney failure Father    Dementia Father    Gout Father    Arthritis Father    Prostate cancer Father    Social History   Socioeconomic History   Marital status: Married    Spouse name: Not on file   Number of children: 3   Years of education: Not on file   Highest education level: Bachelor's degree (e.g., BA, AB, BS)  Occupational History   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      Social Determinants of Health   Financial Resource Strain: Low Risk  (03/01/2021)   Overall Financial Resource Strain (CARDIA)    Difficulty of Paying Living Expenses: Not hard at all  Food Insecurity: No Food Insecurity (03/01/2021)   Hunger Vital Sign    Worried About Running Out of Food in the Last Year: Never true    Ran Out of Food in the Last Year: Never true  Transportation Needs: No Transportation Needs (03/01/2021)   PRAPARE - Administrator, Civil Service (Medical): No    Lack of Transportation (Non-Medical): No  Physical Activity: Unknown (03/01/2021)   Exercise Vital Sign    Days of Exercise per Week: 0 days    Minutes of Exercise per Session: Not on file  Stress: No Stress Concern Present (03/01/2021)   [employer] of Occupational Health - Occupational Stress Questionnaire    Feeling of Stress : Only a little  Social Connections: Moderately Isolated (03/01/2021)   Social Connection and Isolation Panel [NHANES]    Frequency of Communication with Friends and Family: More than three times a week    Frequency of Social Gatherings with Friends and Family: Once a week    Attends Religious Services: Never    Database administrator or Organizations: No    Attends Banker Meetings: Not on file  Marital Status: Married   Past Surgical History:  Procedure Laterality Date   APPLICATION OF WOUND VAC N/A 03/25/2021   Procedure: APPLICATION OF WOUND VAC;  Surgeon: Eldred Manges, MD;  [facility]: [facility];  Service: Orthopedics;  Laterality: N/A;   APPLICATION OF WOUND VAC N/A 03/30/2021   Procedure: WOUND VAC CHANGE 12x6x5;  Surgeon: Eldred Manges, MD;  Location: [facility];  Service: Orthopedics;  Laterality: N/A;   INCISION AND DRAINAGE OF WOUND N/A 03/25/2021   Procedure: LUMBAR POST OP INCISION IRRIGATION;  Surgeon: Eldred Manges, MD;  Location: [facility];  Service: Orthopedics;  Laterality: N/A;   JOINT REPLACEMENT     KNEE ARTHROSCOPY     LUMBAR WOUND  DEBRIDEMENT N/A 03/30/2021   Procedure: REPEAT LUMBAR WOUND DEBRIDEMENT;  Surgeon: Eldred Manges, MD;  Location: [facility];  Service: Orthopedics;  Laterality: N/A;   right rotator cuff     ROTATOR CUFF REPAIR Left    TOTAL KNEE ARTHROPLASTY Left 09/08/2020   Procedure: LEFT TOTAL KNEE ARTHROPLASTY;  Surgeon: Tarry Kos, MD;  Location: [facility];  Service: Orthopedics;  Laterality: Left;   TUBAL LIGATION     Past Surgical History:  Procedure Laterality Date   APPLICATION OF WOUND VAC N/A 03/25/2021   Procedure: APPLICATION OF WOUND VAC;  Surgeon: Eldred Manges, MD;  [facility]: [facility];  Service: Orthopedics;  Laterality: N/A;   APPLICATION OF WOUND VAC N/A 03/30/2021   Procedure: WOUND VAC CHANGE 12x6x5;  Surgeon: Eldred Manges, MD;  [facility]: [facility];  Service: Orthopedics;  Laterality: N/A;   INCISION AND DRAINAGE OF WOUND N/A 03/25/2021   Procedure: LUMBAR POST OP INCISION IRRIGATION;  Surgeon: Eldred Manges, MD;  [facility]: [facility];  Service: Orthopedics;  Laterality: N/A;   JOINT REPLACEMENT     KNEE ARTHROSCOPY     LUMBAR WOUND DEBRIDEMENT N/A 03/30/2021   Procedure: REPEAT LUMBAR WOUND DEBRIDEMENT;  Surgeon: Eldred Manges, MD;  [facility]: [facility];  Service: Orthopedics;  Laterality: N/A;   right rotator cuff     ROTATOR CUFF REPAIR Left    TOTAL KNEE ARTHROPLASTY Left 09/08/2020   Procedure: LEFT TOTAL KNEE ARTHROPLASTY;  Surgeon: Tarry Kos, MD;  [facility]: [facility];  Service: Orthopedics;  Laterality: Left;   TUBAL LIGATION     Past Medical History:  Diagnosis Date   Chronic kidney disease    Depression    Fibromyalgia    Gout    HTN (hypertension)    Insomnia    Morbid obesity (HCC)    Non-recurrent acute suppurative otitis media of left ear without spontaneous rupture of tympanic membrane 04/30/2022   Osteoarthritis    Palpitation    Sleep apnea    BP 131/79   Pulse (!) 57   Ht 5\' 2"  (1.575 m)   Wt 200 lb 9.6 oz (91 kg)   SpO2 99%   BMI 36.69 kg/m   Opioid Risk Score:   Fall Risk  Score:  `1  Depression screen [facility] 2/9     09/06/2022    9:47 AM 08/13/2022   10:04 AM 06/28/2022   10:01 AM 04/22/2022    9:31 AM 03/30/2022   12:54 PM 03/30/2022   12:52 PM 02/01/2022    8:37 AM  Depression screen PHQ 2/9  Decreased Interest 1 0 0 0 1 0 3  Down, Depressed, Hopeless 1 0 0 0 1  2  PHQ - 2 Score 2 0 0 0 2 0 5  Altered sleeping       2  Tired, decreased energy       1  Change in appetite       0  Feeling bad or failure about yourself        1  Trouble concentrating       1  Moving slowly or fidgety/restless       0  Suicidal thoughts       0  PHQ-9 Score       10  Difficult doing work/chores       Somewhat difficult      Review of Systems  Constitutional: Negative.   HENT: Negative.    Eyes: Negative.   Respiratory: Negative.    Cardiovascular: Negative.   Gastrointestinal: Negative.   Endocrine: Negative.   Genitourinary: Negative.   Musculoskeletal:  Positive for arthralgias, back pain and gait problem.  Skin: Negative.   Allergic/Immunologic: Negative.   Hematological: Negative.   Psychiatric/Behavioral:  Positive for dysphoric mood.   All other systems reviewed and are negative.      Objective:   Physical Exam Vitals and nursing note reviewed.  Constitutional:      Appearance: Normal appearance.  Cardiovascular:     Rate and Rhythm: Normal rate and regular rhythm.     Pulses: Normal pulses.     Heart sounds: Normal heart sounds.  Pulmonary:     Effort: Pulmonary effort is normal.     Breath sounds: Normal breath sounds.  Musculoskeletal:     Cervical back: Normal range of motion and neck supple.     Comments: Normal Muscle Bulk and Muscle Testing Reveals:  Upper Extremities: Full ROM and Muscle Strength 5/5 Lumbar Paraspinal Tenderness: L-4-L-5 Lower Extremities : Right: Decreased ROM and Muscle Strength 5/5 Right Lower Extremity Flexion Produces Pain into her Right Patella Left Lower Extremity: Full ROM and Muscle Strength 5/5 Arises from  Table slowly using cane for support Antalgic Gait     Skin:    General: Skin is warm and dry.  Neurological:     Mental Status: She is alert and oriented to person, place, and time.  Psychiatric:        Mood and Affect: Mood normal.        Behavior: Behavior normal.         Assessment & Plan:  1, Right Lumbar Radiculitis: Continue Pregabalin.  [facility]geon at [facility] following. Continue to Monitor. 09/06/2022 Chronic Bilateral Low Back Pain: S/P Lumbar Fusion: Dr Ophelia Charter Following. Continue HEP as Tolerated. Continue to Monitor. 09/06/2022 2.. Post Operative Infection: ID Following: Continue to Monitor. 09/06/2022 3. Fibromyalgia: Continue HEP as Tolerated. Continue Lyrica. Continue to Monitor. 09/06/2022 4. Primary Osteoarthritis: Left Knee: S/P on 09/08/20: Dr Roda Shutters: LEFT TOTAL KNEE ARTHROPLASTY. Continue HEP as Tolerated. Continue to Monitor.  5. Primary Osteoarthritis Right Knee: Continue HEP as Tolerated. Ortho Following. Continue to Monitor. 09/06/2022 6. Chronic Pain Syndrome: Refilled: Hydrocodone 7.5 mg /325 one tablet twice a day as needed for pain #60. Continue Amitriptyline  We will continue the opioid monitoring program, this consists of regular clinic visits, examinations, urine drug screen, pill counts as well as use of [place] Controlled Substance Reporting system. A 12 month History has been reviewed on the [place] Controlled Substance Reporting System on 09/06/2022 7. Depression: Continue Cymbalta. Continue to Monitor. 09/06/2022 8. Bradycardia: Apical Pulse checked. Ms. Murden states PCP following. We will continue to monitor. 09/06/2022   F/U in 1 month

## 2022-09-08 ENCOUNTER — Other Ambulatory Visit: Payer: Self-pay | Admitting: Physical Medicine and Rehabilitation

## 2022-09-08 ENCOUNTER — Other Ambulatory Visit: Payer: Self-pay | Admitting: Internal Medicine

## 2022-09-08 DIAGNOSIS — M4626 Osteomyelitis of vertebra, lumbar region: Secondary | ICD-10-CM

## 2022-09-08 NOTE — Telephone Encounter (Signed)
Patient should have completed Tx with metronidazole per 5/21 office note.  Sandie Ano, RN

## 2022-09-18 ENCOUNTER — Other Ambulatory Visit: Payer: Self-pay | Admitting: Nurse Practitioner

## 2022-09-18 DIAGNOSIS — N1831 Chronic kidney disease, stage 3a: Secondary | ICD-10-CM

## 2022-09-18 DIAGNOSIS — I10 Essential (primary) hypertension: Secondary | ICD-10-CM

## 2022-09-18 DIAGNOSIS — R002 Palpitations: Secondary | ICD-10-CM

## 2022-09-18 DIAGNOSIS — G4733 Obstructive sleep apnea (adult) (pediatric): Secondary | ICD-10-CM

## 2022-09-18 DIAGNOSIS — E782 Mixed hyperlipidemia: Secondary | ICD-10-CM

## 2022-09-20 ENCOUNTER — Encounter: Payer: Self-pay | Admitting: Internal Medicine

## 2022-09-20 ENCOUNTER — Other Ambulatory Visit: Payer: Self-pay

## 2022-09-20 ENCOUNTER — Ambulatory Visit: Payer: 59 | Admitting: Internal Medicine

## 2022-09-20 VITALS — BP 130/79 | HR 57 | Temp 98.3°F | Ht 62.0 in | Wt 200.0 lb

## 2022-09-20 DIAGNOSIS — M4626 Osteomyelitis of vertebra, lumbar region: Secondary | ICD-10-CM

## 2022-09-20 LAB — CBC WITH DIFFERENTIAL/PLATELET
Lymphs Abs: 2090 cells/uL (ref 850–3900)
MCHC: 32.5 g/dL (ref 32.0–36.0)
RBC: 4.32 10*6/uL (ref 3.80–5.10)
Total Lymphocyte: 31.2 %

## 2022-09-20 LAB — COMPLETE METABOLIC PANEL WITH GFR
Potassium: 4.3 mmol/L (ref 3.5–5.3)
eGFR: 45 mL/min/{1.73_m2} — ABNORMAL LOW (ref >=60)

## 2022-09-20 LAB — SEDIMENTATION RATE: Sed Rate: 45 mm/h — ABNORMAL HIGH (ref 0–30)

## 2022-09-20 NOTE — Progress Notes (Signed)
Patient Active Problem List   Diagnosis Date Noted   Forearm tendonitis 07/23/2022   Rash and nonspecific skin eruption 07/23/2022   Asymptomatic bradycardia 07/23/2022   Rheumatoid arthritis with rheumatoid factor of multiple sites without organ or systems involvement (HCC) 04/30/2022   Osteopenia 03/03/2022   Degenerative spondylolisthesis 02/19/2021   Status post total left knee replacement 09/08/2020   OSA (obstructive sleep apnea) 05/19/2020   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/17/2020   Chronic pain syndrome 03/03/2020   S/P lumbar fusion 02/05/2020   Vitamin D deficiency 01/09/2020   Hyperlipidemia 01/09/2020   CKD (chronic kidney disease) stage 3, GFR 30-59 ml/min (HCC) 01/09/2020   HTN (hypertension)    Morbid obesity (HCC)    Gout    Depression    Insomnia    Chronically low serum potassium 04/22/2014   Environmental and seasonal allergies 04/22/2014   Palpitations 04/22/2014    Patient's Medications  New Prescriptions   No medications on file  Previous Medications   ALLOPURINOL (ZYLOPRIM) 100 MG TABLET    TAKE 2 TABLETS BY MOUTH DAILY   AMITRIPTYLINE (ELAVIL) 10 MG TABLET    TAKE 1 TABLET BY MOUTH EVERYDAY AT BEDTIME   AMLODIPINE-VALSARTAN (EXFORGE) 5-160 MG TABLET    Take 1 tablet by mouth daily.   DAPAGLIFLOZIN PROPANEDIOL (FARXIGA) 5 MG TABS TABLET    Take 1 tablet (5 mg total) by mouth daily.   DIAZEPAM (VALIUM) 2 MG TABLET    Take one pill one hour prior to mri and then repeat just prior if needed   DULOXETINE (CYMBALTA) 20 MG CAPSULE    TAKE 1 CAPSULE BY MOUTH DAILY   ESZOPICLONE (LUNESTA) 1 MG TABS TABLET    Take 1 tablet (1 mg total) by mouth at bedtime as needed for sleep. Take immediately before bedtime   FLUTICASONE (FLONASE) 50 MCG/ACT NASAL SPRAY    Place 1 spray into both nostrils daily as needed for allergies.   HYDROCODONE-ACETAMINOPHEN (NORCO) 7.5-325 MG TABLET    Take 1 tablet by mouth 2 (two) times daily as needed for  moderate pain.   HYDROCORTISONE (ANUSOL-HC) 2.5 % RECTAL CREAM    PLACE 1 APPLICATION RECTALLY 2 (TWO) TIMES DAILY.   KETOCONAZOLE (NIZORAL) 2 % CREAM    Apply 1 Application topically daily. Apply to rash twice a day. If no improvement after 2 weeks, please let provider know.   LORATADINE (CLARITIN) 10 MG TABLET    Take 10 mg by mouth daily as needed for allergies.   MOMETASONE (ELOCON) 0.1 % CREAM    Apply very thin layer to rash at bedtime. May use one other time during the day if needed. Stop after 7 days, if still needed may repeat after 3 day break.   PREGABALIN (LYRICA) 75 MG CAPSULE    Take 1 capsule (75 mg total) by mouth 2 (two) times daily.   TIZANIDINE (ZANAFLEX) 4 MG TABLET    Take 1 tablet (4 mg total) by mouth every 8 (eight) hours as needed for muscle spasms.   VITAMIN D, ERGOCALCIFEROL, (DRISDOL) 1.25 MG (50000 UNIT) CAPS CAPSULE    TAKE 1 CAPSULE (50,000 UNITS TOTAL) BY MOUTH EVERY 7 (SEVEN) DAYS  Modified Medications   No medications on file  Discontinued Medications   No medications on file    Subjective: Amy Hooper is a  72 year old female with obesity, CKD, L4-L5 stenosis anterolisthesis status post L4-5 transforaminal lumbar interbody fusion with pedicle instrumentation cage  lateral fusion right L5-S1 microdiscectomy on 03/09/2021.  Patient developed drainage from surgical site infection hospitalized underwent I&D(03/25/21) of lumbar incision.  OR cultures positive for Enterobacter aeruginosa, Morganella and Bacteroides fragilis.  She underwent repeat debridement in the OR due to purulent drainage on 03/30/21. Patient was initially on cefazolin transitioned to cefpime then metronidazole added.  Plan was to complete 2 weeks of IV therapy with cefepime and metronidazole (04/14/21) then transition to levaquin and metro to complete 8 weeks of antibiotics from OR on 03/30/21. She does have retained hardware as suuch will need suppressive antibiotics with metro and levaquin.   Interval 1/24: Changed to levaquin and metronidazole 05/19/21:Wound vac in place. Followed by Dr. Sheila Oats). Pt reports tolerating levaquin and metronidazole.  06/11/21: no new complaints 07/30/21: Presents  due to GI symptoms. Pt has loss of appetitie, loose stools 5-6 times/day. Loose stools started  in the beginning of April. Pt has been taking percocet PRN less often since April. She notices stools are more formed with percocet. Denies, fever, chills, N,V. Pt had been on probiotics, but has not been taking them since mid April.  She is on levaquin 25 mg PO qd for chronic suppression. Seen by Dr. Ophelia Charter on 4/26 and wound noted to be completely healed. Wound vac off.  8/15: reprots muscl epain throughout her body. 12/21/21: Pt she has back pain with certain activities. Dneis fever or chills. Reports no issues with tolerating suppresive antibiotics.   06/28/22: Tolerating abx. No diarrhea.   08/10/22: She has been off of abx. Pt has some back pain after gardening. No fevers or chills. Today  09/20/22: PT states she has back pain that radiates to behind her legs. Is going to [facility] for second opinion. Denies fevers and chills Review of Systems: Review of Systems  All other systems reviewed and are negative.   Past Medical History:  Diagnosis Date   Chronic kidney disease    Depression    Fibromyalgia    Gout    HTN (hypertension)    Insomnia    Morbid obesity (HCC)    Non-recurrent acute suppurative otitis media of left ear without spontaneous rupture of tympanic membrane 04/30/2022   Osteoarthritis    Palpitation    Sleep apnea     Social History   Tobacco Use   Smoking status: Never    Passive exposure: Current   Smokeless tobacco: Never  Vaping Use   Vaping Use: Never used  Substance Use Topics   Alcohol use: Never    Alcohol/week: 0.0 standard drinks of alcohol   Drug use: Never    Family History  Problem Relation Age of Onset   Hypothyroidism Mother     Hyperlipidemia Other    Kidney failure Father    Dementia Father    Gout Father    Arthritis Father    Prostate cancer Father     Allergies  Allergen Reactions   Cefuroxime Axetil Rash and Hives   Clarithromycin Rash and Hives    Other reaction(s): Unknown   Nsaids Nausea And Vomiting    Other reaction(s): Unknown   Cefuroxime     Other reaction(s): Unknown   Robaxin [Methocarbamol] Nausea And Vomiting    "It tears my stomach up."   Toradol [Ketorolac Tromethamine] Nausea And Vomiting   Tramadol Nausea And Vomiting and Other (See Comments)    Upset stomach Other reaction(s): Unknown    Health Maintenance  Topic Date Due   Hepatitis C Screening  Never done  Zoster Vaccines- Shingrix (1 of 2) Never done   COVID-19 Vaccine (4 - 2023-24 season) 11/20/2021   INFLUENZA VACCINE  10/21/2022   MAMMOGRAM  01/01/2023   Colonoscopy  01/04/2024   DTaP/Tdap/Td (2 - Td or Tdap) 01/07/2030   Pneumonia Vaccine 58+ Years old  Completed   DEXA SCAN  Completed   HPV VACCINES  Aged Out    Objective:  There were no vitals filed for this visit. There is no height or weight on file to calculate BMI.  Physical Exam Constitutional:      Appearance: Normal appearance.  HENT:     Head: Normocephalic and atraumatic.     Right Ear: Tympanic membrane normal.     Left Ear: Tympanic membrane normal.     Nose: Nose normal.     Mouth/Throat:     Mouth: Mucous membranes are moist.  Eyes:     Extraocular Movements: Extraocular movements intact.     Conjunctiva/sclera: Conjunctivae normal.     Pupils: Pupils are equal, round, and reactive to light.  Cardiovascular:     Rate and Rhythm: Normal rate and regular rhythm.     Heart sounds: No murmur heard.    No friction rub. No gallop.  Pulmonary:     Effort: Pulmonary effort is normal.     Breath sounds: Normal breath sounds.  Abdominal:     General: Abdomen is flat.     Palpations: Abdomen is soft.  Musculoskeletal:     Comments: Lumbar  wound healed  Skin:    General: Skin is warm and dry.  Neurological:     General: No focal deficit present.     Mental Status: She is alert and oriented to person, place, and time.  Psychiatric:        Mood and Affect: Mood normal.     Lab Results Lab Results  Component Value Date   WBC 5.2 07/23/2022   HGB 12.3 07/23/2022   HCT 38.0 07/23/2022   MCV 90 07/23/2022   PLT 307 07/23/2022    Lab Results  Component Value Date   CREATININE 1.44 (H) 07/23/2022   BUN 17 07/23/2022   NA 139 07/23/2022   K 4.8 07/23/2022   CL 102 07/23/2022   CO2 22 07/23/2022    Lab Results  Component Value Date   ALT 9 07/23/2022   AST 15 07/23/2022   ALKPHOS 121 07/23/2022   BILITOT 0.3 07/23/2022    Lab Results  Component Value Date   CHOL 235 (H) 07/23/2022   HDL 69 07/23/2022   LDLCALC 148 (H) 07/23/2022   TRIG 101 07/23/2022   CHOLHDL 3.4 07/23/2022   No results found for: "LABRPR", "RPRTITER" No results found for: "HIV1RNAQUANT", "HIV1RNAVL", "CD4TABS"   Problem List Items Addressed This Visit   None  Assessment/Plan #Lumbar wound infection with retained hardware SP I&D on 03/25/21  #OR Cx from 03/25/21 +  enterobacter aeruginosa, Morganella morganii,  bacteroides fragilis(beta lactamase positive) #Medication management -Completed 2 weeks of cefepime from OR on 1/24 and started on levaquin 750mg  PO q48h(renally dosed) and  metronidazole 500mg  PO bid(EOT 05/25/21). Then continued on levaquin and metronidazole for chronic suppression. Metronidazole changed to augmentin due to muscle soreness. Normal CK on 8/15 -labs 10/2 3.5 crp, esr 29 -Follow[facility] Pt is tolerating antibiotics, she noted she may be getting a second surgery for her back. No tenderness note don exam, labs stable as above.  I thin kit ok to trial  pt off of PO suppression. I discussed if she has fever, chills, worsening back pain to call clinic and would likely restart antibiotics.  - On 06/28/22  stopped augmentin 500mg  PO bid and levaquin 500 per as pt  completed one year of suppression. -5/3 esr and crp stable 34/5 respectively. -Since last week pt has had back pain radiate to her legs, no fever and chills. Is planning to go to [facility] for [facility].  Plan: Labs today, if esr and crp elevated then will plan on restarting suppresive abx. Although current pain seems to be neuropathic MRI Lumbar ordered for [facility] referral and in the setting of back pain F/U with myself on 8/26   Danelle Earthly, MD [facility] 09/20/2022, 9:04 AM   I have personally spent 45 minutes involved in face-to-face and non-face-to-face activities for this patient on the day of the visit. Professional time spent includes the following activities: Preparing to see the patient (review of tests), Obtaining and/or reviewing separately obtained history (admission/discharge record), Performing a medically appropriate examination and/or evaluation , Ordering medications/tests/procedures, referring and communicating with other health care professionals, Documenting clinical information in the EMR, Independently interpreting results (not separately reported), Communicating results to the patient/family/caregiver, Counseling and educating the patient/family/caregiver and Care coordination (not separately reported).

## 2022-09-20 NOTE — Patient Instructions (Signed)
IF esr/crp elevated then will restart suppresive abx. MRI ordered for [facility]. F/U in one month with ID

## 2022-09-21 ENCOUNTER — Telehealth: Payer: Self-pay

## 2022-09-21 LAB — CBC WITH DIFFERENTIAL/PLATELET
Absolute Monocytes: 543 {cells}/uL (ref 200–950)
Basophils Absolute: 20 cells/uL (ref 0–200)
Basophils Relative: 0.3 %
Eosinophils Absolute: 101 cells/uL (ref 15–500)
Eosinophils Relative: 1.5 %
HCT: 38.2 % (ref 35.0–45.0)
Hemoglobin: 12.4 g/dL (ref 11.7–15.5)
MCH: 28.7 pg (ref 27.0–33.0)
MCV: 88.4 fL (ref 80.0–100.0)
MPV: 10.3 fL (ref 7.5–12.5)
Monocytes Relative: 8.1 %
Neutro Abs: 3946 {cells}/uL (ref 1500–7800)
Neutrophils Relative %: 58.9 %
Platelets: 324 10*3/uL (ref 140–400)
RDW: 14.5 % (ref 11.0–15.0)
WBC: 6.7 10*3/uL (ref 3.8–10.8)

## 2022-09-21 LAB — COMPLETE METABOLIC PANEL WITHOUT GFR
AST: 14 U/L (ref 10–35)
Albumin: 4 g/dL (ref 3.6–5.1)
Alkaline phosphatase (APISO): 120 U/L (ref 37–153)
BUN/Creatinine Ratio: 13 (calc) (ref 6–22)
BUN: 17 mg/dL (ref 7–25)
CO2: 30 mmol/L (ref 20–32)
Chloride: 106 mmol/L (ref 98–110)
Creat: 1.3 mg/dL — ABNORMAL HIGH (ref 0.50–1.05)
Glucose, Bld: 88 mg/dL (ref 65–99)

## 2022-09-21 LAB — C-REACTIVE PROTEIN: CRP: 5.7 mg/L (ref <8.0)

## 2022-09-21 LAB — COMPLETE METABOLIC PANEL WITH GFR
AG Ratio: 1.4 (calc) (ref 1.0–2.5)
ALT: 11 U/L (ref 6–29)
Calcium: 9.5 mg/dL (ref 8.6–10.4)
Globulin: 2.9 g/dL (calc) (ref 1.9–3.7)
Sodium: 142 mmol/L (ref 135–146)
Total Bilirubin: 0.3 mg/dL (ref 0.2–1.2)
Total Protein: 6.9 g/dL (ref 6.1–8.1)

## 2022-09-21 NOTE — Telephone Encounter (Signed)
ESR elevated, notified Dr. Thedore Mins.   Per Dr. Thedore Mins:  "I called her, she did not pick up. Left a message for her to call back. Would wait on the MRI prior to starting antibiotics. Her ESR is up her CRP is normal."   Sandie Ano, RN'

## 2022-09-22 NOTE — Telephone Encounter (Signed)
Patient made aware of results and per Dr. Sanda Klein would lie to wait on starting antibiotic therapy for MRI results. Patient verbalized understanding.   Valarie Cones, LPN

## 2022-09-27 ENCOUNTER — Encounter: Payer: Self-pay | Admitting: Internal Medicine

## 2022-09-28 MED ORDER — DIAZEPAM 2 MG PO TABS: ORAL_TABLET | ORAL | 0 refills | Status: DC

## 2022-09-30 ENCOUNTER — Ambulatory Visit (HOSPITAL_COMMUNITY)
Admission: RE | Admit: 2022-09-30 | Discharge: 2022-09-30 | Disposition: A | Discharge status: Home / Self Care | Payer: 59 | Source: Ambulatory Visit | Attending: Family Medicine | Admitting: Family Medicine

## 2022-09-30 ENCOUNTER — Ambulatory Visit (HOSPITAL_COMMUNITY)
Admission: RE | Admit: 2022-09-30 | Discharge: 2022-09-30 | Disposition: A | Discharge status: Home / Self Care | Payer: 59 | Source: Ambulatory Visit | Attending: Internal Medicine | Admitting: Internal Medicine

## 2022-09-30 DIAGNOSIS — M4626 Osteomyelitis of vertebra, lumbar region: Secondary | ICD-10-CM | POA: Insufficient documentation

## 2022-10-05 ENCOUNTER — Other Ambulatory Visit: Payer: Self-pay | Admitting: Registered Nurse

## 2022-10-14 ENCOUNTER — Telehealth: Payer: Self-pay | Admitting: *Deleted

## 2022-10-14 ENCOUNTER — Encounter: Payer: 59 | Attending: Registered Nurse | Admitting: Physical Medicine and Rehabilitation

## 2022-10-14 ENCOUNTER — Encounter: Payer: Self-pay | Admitting: Physical Medicine and Rehabilitation

## 2022-10-14 VITALS — BP 135/77 | HR 56 | Ht 62.0 in

## 2022-10-14 DIAGNOSIS — G479 Sleep disorder, unspecified: Secondary | ICD-10-CM

## 2022-10-14 DIAGNOSIS — G588 Other specified mononeuropathies: Secondary | ICD-10-CM | POA: Insufficient documentation

## 2022-10-14 DIAGNOSIS — M172 Bilateral post-traumatic osteoarthritis of knee: Secondary | ICD-10-CM | POA: Diagnosis present

## 2022-10-14 DIAGNOSIS — M170 Bilateral primary osteoarthritis of knee: Secondary | ICD-10-CM | POA: Diagnosis not present

## 2022-10-14 DIAGNOSIS — T8140XD Infection following a procedure, unspecified, subsequent encounter: Secondary | ICD-10-CM | POA: Diagnosis present

## 2022-10-14 DIAGNOSIS — M797 Fibromyalgia: Secondary | ICD-10-CM | POA: Diagnosis not present

## 2022-10-14 DIAGNOSIS — S2231XS Fracture of one rib, right side, sequela: Secondary | ICD-10-CM | POA: Insufficient documentation

## 2022-10-14 DIAGNOSIS — G629 Polyneuropathy, unspecified: Secondary | ICD-10-CM | POA: Insufficient documentation

## 2022-10-14 MED ORDER — CAPSAICIN-CLEANSING GEL 8 % EX KIT
1.0000 | PACK | Freq: Once | CUTANEOUS | Status: AC
Start: 2022-10-14 — End: 2022-10-14
  Administered 2022-10-14: 1 via TOPICAL

## 2022-10-14 MED ORDER — LIDOCAINE 5 % EX PTCH: 1.0000 | MEDICATED_PATCH | CUTANEOUS | 0 refills | Status: DC

## 2022-10-14 NOTE — Telephone Encounter (Signed)
Amy Hooper (Key: [id number]) - [id number] Lidocaine 5% patches Status: PA Response - DeniedCreated: July 25th, 2024 [phone]: July 25th, 2024

## 2022-10-14 NOTE — Progress Notes (Signed)
Subjective:    Patient ID: Amy Hooper, female    DOB: 02/12/1951, 72 y.o.   MRN: [id number]  HPI Female with f/u OSA, morbid obesity, right foot neuropathy, hypertension, fibromyalgia, depression, gout, CKD, degenerative disc disease-lumbar, bilateral knee surgeries for torn mensici, bilateral shoulder surgeries for rotator cuff presents with bilateral L > R knee pain and wound-related pain. She presents for follow-up today regarding her food allergies  1) Wound related pain -she is having severe pain associated with her wound vac dressing changes -she has been taking 2 hydrocodone per day and this allows her to function but she states her surgeon is no longer willing to prescribe for her -she asks about alternative pain medications she can take -she says her surgeon says her wound vac may be able to be removed at the end of March.  She cannot sit too long because this aggravates the pain.  -she is following with ID -ID is giving her a break from suppression antibiotics to see if she can get knee surgery  2) Knee pain: -she had relief from synvisc -she has terrible pain during the night -patches and the gel do not help.  Initially stated: Started ~1990. After a fall on her knees.  Progressively getting worse.  Steroid injections improve the pain along with brace.  Ambulation exacerbates the pain.  Moving after prolonged postures exacerbates the pain.  Achy.  Radiates down leg.  Intermittent. Denies associated weakness, numbness.  Tylenol. 1 fall summer of 2021 falling backward in chair.  Pain limits ambulation.  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 She has had bilateral L4-5 lumbar epidural steroid injections.  She has also had facet injections.  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  Irritation of S1 nerve roots.  She had a left knee steroid  injection on 03/18/2020 with Ortho.  She is seen rheumatology as well.  No reviewed from rheumatology, orthopedic surgeon, physiatrist, neurology-plan for knee replacement in December 2022. She does note that she is improving with weight loss and dietary changes.   3) Hand pain -spiked since she has been off Celebrex due to her kidney injury -she was recommended to take Qunidine but she can't take because of the antibiotics which she is on for a year due to her wound in her back.  -percocet does not help with the pain as much as the hydrocodone.   4) AKI -improved since off Celebrex  5) Depression:  -she feels miserable.  -she was started on Cymbalta. This did help with the pain in her legs.  -she is not sure if she needs to see anyone for grief  6) Insomnia: -not sleeping well.  -she cannot sleep without Lunesta and is having an issue with this for her insurance -she also takes Amitriptyline, Lyrica, and Cymbalta- when she takes all these she does not get up until 11am the next day.  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  Since last visit, she states she has had great improvement with pool therapy. She states she is hungry. She had good benefits with Lyrica. She has not seen Nephro yet. She is awaiting CPAP.  She has bad fibromyalgia flares when the weather changes- especially when it rains. She was started on Neurontin by Dr. Allena Katz and  it does help on a daily basis. She had relief with Celebrex well before but had to stop due to CKD. She has never tried steroids.   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   Situational depression: She is going through changes with her family. They are all living in her house and everyone is arguing. She does not want to get herself worked up due to her hypertension. Her family brings her in a lot. Her husband says why don't you say anything. She felt a car coming across her yesterday and she screamed. She feels overwhelmed. Her mother may have undiagnosed mental illness. Her mom called th police on her husband.   Severe arthritis and bulging discs: -she has a lot of pain and nothing helps it. If she sits too long it hurts and if she walks to long it hurts -Lyrica and steroids help her pain -she is planing to have surgery.   Weight gain  Not voiding as much as she used to -check Creatinine today.    Pain Inventory Average Pain 9 Pain Right Now 8 My pain is constant, burning, stabbing, and aching, stabbing  In the last 24 hours, has pain interfered with the following? General activity 8 Relation with others 9 Enjoyment of life 10 What TIME of day is your pain at its worst? morning , daytime, and evening Sleep (in general) Fair  Pain is worse with: walking, bending, sitting, standing, and some activites Pain improves with: heat/ice and medication Relief from Meds: 7   Family History  Problem Relation Age of Onset   Hypothyroidism Mother    Hyperlipidemia Other    Kidney failure Father    Dementia Father    Gout Father    Arthritis Father    Prostate cancer Father    Social History   Socioeconomic History   Marital status: Married    Spouse name: Not on file   Number of children: 3   Years of education: Not on file   Highest education level: Bachelor's degree (e.g., BA, AB, BS)  Occupational History   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     Social Determinants of Health   Financial Resource Strain: Low Risk  (03/01/2021)   Overall Financial Resource Strain (CARDIA)    Difficulty of Paying Living Expenses: Not hard at all  Food Insecurity: No Food Insecurity (03/01/2021)   Hunger Vital Sign    Worried About Running Out of Food in the Last Year: Never true    Ran Out of Food in the  Last Year: Never true  Transportation Needs: No Transportation Needs (03/01/2021)   PRAPARE - Administrator, Civil Service (Medical): No    Lack of Transportation (Non-Medical): No  Physical Activity: Unknown (03/01/2021)   Exercise Vital Sign    Days of Exercise per Week: 0 days    Minutes of Exercise per Session: Not on file  Stress: No Stress Concern Present (03/01/2021)   [employer] of Occupational Health - Occupational Stress Questionnaire    Feeling of Stress : Only a little  Social Connections: Moderately Isolated (03/01/2021)   Social Connection and Isolation Panel [NHANES]    Frequency of Communication with Friends and Family: More than three times a week    Frequency of Social Gatherings with Friends and Family: Once a week    Attends Religious Services: Never    Database administrator or Organizations: No    Attends Engineer, structural: Not on file    Marital Status: Married   Past Surgical History:  Procedure Laterality Date   APPLICATION OF WOUND VAC N/A 03/25/2021   Procedure: APPLICATION OF WOUND VAC;  Surgeon: Eldred Manges, MD;  Location: [facility] OR;  Service: Orthopedics;  Laterality: N/A;   APPLICATION OF WOUND VAC N/A 03/30/2021   Procedure: WOUND VAC CHANGE 12x6x5;  Surgeon:  Eldred Manges, MD;  Location: [facility] OR;  Service: Orthopedics;  Laterality: N/A;   INCISION AND DRAINAGE OF WOUND N/A 03/25/2021   Procedure: LUMBAR POST OP INCISION IRRIGATION;  Surgeon: Eldred Manges, MD;  Location: [facility] OR;  Service: Orthopedics;  Laterality: N/A;   JOINT REPLACEMENT     KNEE ARTHROSCOPY     LUMBAR WOUND DEBRIDEMENT N/A 03/30/2021   Procedure: REPEAT LUMBAR WOUND DEBRIDEMENT;  Surgeon: Eldred Manges, MD;  Location: [facility] OR;  Service: Orthopedics;  Laterality: N/A;   right rotator cuff     ROTATOR CUFF REPAIR Left    TOTAL KNEE ARTHROPLASTY Left 09/08/2020   Procedure: LEFT TOTAL KNEE ARTHROPLASTY;  Surgeon: Tarry Kos, MD;  Location: [facility] OR;  Service: Orthopedics;  Laterality: Left;   TUBAL LIGATION     Past Medical History:  Diagnosis Date   Chronic kidney disease    Depression    Fibromyalgia    Gout    HTN (hypertension)    Insomnia    Morbid obesity (HCC)    Non-recurrent acute suppurative otitis media of left ear without spontaneous rupture of tympanic membrane 04/30/2022   Osteoarthritis    Palpitation    Sleep apnea    BP 135/77   Pulse (!) 56   Ht 5\' 2"  (1.575 m)   SpO2 97%   BMI 36.58 kg/m   Opioid Risk Score:   Fall Risk Score:  `1  Depression screen [facility] 2/9     10/14/2022    9:59 AM 09/06/2022    9:47 AM 08/13/2022   10:04 AM 06/28/2022   10:01 AM 04/22/2022    9:31 AM 03/30/2022   12:54 PM 03/30/2022   12:52 PM  Depression screen [facility] 2/9  Decreased Interest 0 1 0 0 0 1 0  Down, Depressed, Hopeless 0 1 0 0 0 1   PHQ - 2 Score 0 2 0 0 0 2 0   Review of Systems  Constitutional: Negative.   HENT: Negative.    Eyes: Negative.   Respiratory: Negative.    Cardiovascular: Negative.   Gastrointestinal: Negative.   Endocrine: Negative.   Genitourinary: Negative.   Musculoskeletal:  Positive for arthralgias, back pain and gait problem. Negative for joint swelling, myalgias, neck pain and neck stiffness.       Pain in both legs  Allergic/Immunologic:  Negative.   Hematological: Negative.   All other systems reviewed and are negative.     Objective:   Physical Exam  Gen: no distress, normal appearing, weight 200 lbs, BMI 34.57, BP 135/77 HEENT: oral mucosa pink and moist, NCAT Cardio: Reg rate Chest: normal effort, normal rate of breathing Abd: soft, non-distended Ext: no edema Psych: pleasant, normal affect Skin: no open lesions on right foot Neuro: Alert and oriented x3    Assessment & Plan:  Female with past medical history/past surgical history of OSA, morbid obesity, hypertension, fibromyalgia, depression, gout, CKD, degenerative disc disease-lumbar, bilateral knee surgeries for torn mensici, bilateral shoulder surgeries for rotator cuff presents with bilateral L > R knee pain.   1. Bilateral knee OA - endstage  Discussed plan for knee surgery  -discussed that weight loss will help  Patient was supposed to have knee replacements last year, but due to insurance changed to summer 2022  Endstage OA in b/l knee per Ortho, no films available  No benefit with Heat/Cold, PT (~2019), Robaxin, Flexaril, Tizanidine  Continue bracing prn (limit on right knee - educated)  Continue Voltaren gel  Lidocaine 5% patch ordered  -Provided with a pain relief journal and discussed that it contains foods and lifestyle tips to naturally help to improve pain. Discussed that these lifestyle strategies are also very good for health unlike some medications which can have negative side effects. Discussed that the act of keeping a journal can be therapeutic and helpful to realize patterns what helps to trigger and alleviate pain.    Decreased Cymbalta to 20mg  daily.   Patient states main goal is to ambulate  Wants to buy a pool - afraid of public locations due to CoVid  Recommended follow up with Nephro regarding Chondroitin sulfate, appointment next month  Tolerating mild exercise - limited by back   Continue antiinflammatory diet -Discussed following  foods that may reduce pain: 1) Ginger (especially studied for arthritis)- reduce leukotriene production to decrease inflammation 2) Blueberries- high in phytonutrients that decrease inflammation 3) Salmon- marine omega-3s reduce joint swelling and pain 4) Pumpkin seeds- reduce inflammation 5) dark chocolate- reduces inflammation 6) turmeric- reduces inflammation 7) tart cherries - reduce pain and stiffness 8) extra virgin olive oil - its compound olecanthal helps to block prostaglandins  9) chili peppers- can be eaten or applied topically via capsaicin 10) mint- helpful for headache, muscle aches, joint pain, and itching 11) garlic- reduces inflammation  Link to further information on diet for chronic pain: http://www.bray.com/   2. Sleep disturbance  Decrease Lunesta to 1mg  HS  Continue to await CPAP -Try to go outside near sunrise -Get exercise during the day.  -Turn off all devices an hour before bedtime.  -Teas that can benefit: chamomile, valerian root, Brahmi (Bacopa) -Can consider over the counter melatonin or magnesium glycinate (latter can also help with constipation) -Pistachios naturally increase the production of melatonin  -start amitriptyline 10mg  HS    3. Morbid Obesity  Continue follow up with dietitian  Encouraged weight loss  4. Myalgia   Will consider trigger point injections  5. Fibromyalgia  Prescribed aquatic therapy - had great benefit  Encouraged ROM, stretching  Continue Cymbalta  Decrease Lyrica to 50mg  TID  6) Spinal surgical hardware infection -discussed plan to wean off Percocet -will continue on  one year of antibiotics -Discussed that the hydrocodone was helping with wound related pain which is still horrible. Will get UDS and pain contact today.  -discussed maximizing tylenol which she is already doing  -avoid NSAIDs due to only one kidney present -discussed wean to one,  then no opioids after wound vac removal -discussed that she is off suppression antibiotics right now -she has felt increased pan recently, CBC ordered -discussed that there is no infection anywhere  7) depression -discussed taking amitriptyline earlier in the evening -continue to follow-up with neuropsych -discussed family stressors -recommended eating saurkraut -discussed kombucha, yogurt, and kefir.  -Discussed her desire to return to work.  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 Discussed that this is a capsaicin patch, stronger than capsaicin cream. Discussed that it is currently approved for diabetic peripheral neuropathy and post-herpetic neuralgia, but that it has also shown benefit in treating other forms of neuropathy. Provided patient with link to site to learn more about the patch: https://www.clark.biz/. Discussed that the patch would be placed in office and benefits usually last 3 months. Discussed that unintended exposure to capsaicin can cause severe irritation of eyes, mucous membranes, respiratory tract, and skin, but that Qutenza is a local treatment and does not have the systemic side effects of other nerve medications. Discussed that there may be pain, itching, erythema, and decreased sensory function associated with the application of Qutenza. Side effects usually subside within 1 week. A cold pack of analgesic medications can help with these side effects. Blood pressure can also be increased due to pain associated with administration of the patch.   Back brace ordered via [employer] and advised to use when bending, discussed that it is better to use the brace that the  medicine  1 patch of Qutenza was applied to the area of pain. Ice packs were applied during the procedure to ensure patient comfort. Blood pressure was monitored every 15 minutes. The patient tolerated the procedure well. Post-procedure instructions were given and follow-up has been scheduled.    9) Intercostal neuralgia: -lidocaine patch prescribed -discussed this could be secondary to her prior rib fractures   40 minutes spent in discussion of risks and benefits of Qutenza and obtaining informed consent, discussion of q90 day follow-up and expectation of improvement in pain with each repeat application, discussion of intercostal neuralgia and sending a lidocaine patch for her, discussed that it is better to use the brace than the medicine given its negative effects on cognition/sedation

## 2022-10-15 NOTE — Telephone Encounter (Signed)
Lidocaine 5% patch denied.

## 2022-10-16 ENCOUNTER — Other Ambulatory Visit: Payer: Self-pay | Admitting: Internal Medicine

## 2022-10-17 ENCOUNTER — Encounter: Payer: Self-pay | Admitting: Orthopaedic Surgery

## 2022-10-20 ENCOUNTER — Telehealth: Payer: Self-pay | Admitting: *Deleted

## 2022-10-20 ENCOUNTER — Other Ambulatory Visit: Payer: Self-pay | Admitting: Physical Medicine and Rehabilitation

## 2022-10-20 ENCOUNTER — Encounter: Payer: Self-pay | Admitting: Physical Medicine and Rehabilitation

## 2022-10-20 MED ORDER — HYDROCODONE-ACETAMINOPHEN 7.5-325 MG PO TABS: 1.0000 | ORAL_TABLET | Freq: Two times a day (BID) | ORAL | 0 refills | Status: DC | PRN

## 2022-10-20 NOTE — Telephone Encounter (Signed)
[facility] has reached out to notify that an alternative med is requested. You sent in rx for Hydrocodone/APAP 7.5-325. This medication is on backorder they have 10/325.

## 2022-10-21 NOTE — Telephone Encounter (Signed)
Left message to call back with another pharmacy.

## 2022-10-27 ENCOUNTER — Other Ambulatory Visit: Payer: Self-pay | Admitting: Physical Medicine and Rehabilitation

## 2022-10-27 NOTE — Telephone Encounter (Signed)
2nd message this is being addressed mychart.

## 2022-10-28 ENCOUNTER — Telehealth: Payer: Self-pay | Admitting: Registered Nurse

## 2022-10-28 MED ORDER — HYDROCODONE-ACETAMINOPHEN 7.5-325 MG PO TABS: 1.0000 | ORAL_TABLET | Freq: Two times a day (BID) | ORAL | 0 refills | Status: DC | PRN

## 2022-10-28 NOTE — Telephone Encounter (Signed)
PMP was Reviewed.  Hydrocodone e-scribed to pharmacy. Amy Hooper is aware of the above via My-Chart

## 2022-10-30 ENCOUNTER — Other Ambulatory Visit: Payer: Self-pay | Admitting: Nurse Practitioner

## 2022-11-05 ENCOUNTER — Encounter: Payer: 59 | Attending: Registered Nurse | Admitting: Registered Nurse

## 2022-11-05 ENCOUNTER — Encounter: Payer: Self-pay | Admitting: Registered Nurse

## 2022-11-05 VITALS — BP 120/69 | HR 75 | Ht 62.0 in | Wt 207.0 lb

## 2022-11-05 DIAGNOSIS — G894 Chronic pain syndrome: Secondary | ICD-10-CM | POA: Diagnosis not present

## 2022-11-05 DIAGNOSIS — M2550 Pain in unspecified joint: Secondary | ICD-10-CM | POA: Diagnosis present

## 2022-11-05 DIAGNOSIS — M797 Fibromyalgia: Secondary | ICD-10-CM | POA: Diagnosis present

## 2022-11-05 DIAGNOSIS — Z79891 Long term (current) use of opiate analgesic: Secondary | ICD-10-CM | POA: Diagnosis present

## 2022-11-05 DIAGNOSIS — Z5181 Encounter for therapeutic drug level monitoring: Secondary | ICD-10-CM

## 2022-11-05 DIAGNOSIS — M5416 Radiculopathy, lumbar region: Secondary | ICD-10-CM | POA: Diagnosis not present

## 2022-11-05 MED ORDER — HYDROCODONE-ACETAMINOPHEN 7.5-325 MG PO TABS: 1.0000 | ORAL_TABLET | Freq: Two times a day (BID) | ORAL | 0 refills | Status: DC | PRN

## 2022-11-05 MED ORDER — PREGABALIN 75 MG PO CAPS: 75.0000 mg | ORAL_CAPSULE | Freq: Two times a day (BID) | ORAL | 1 refills | Status: DC

## 2022-11-05 NOTE — Progress Notes (Signed)
Subjective:    Patient ID: Amy Hooper, female    DOB: 1950/06/06, 72 y.o.   MRN: [id number]  HPI: Amy Hooper is a 72 y.o. female who returns for follow up appointment for chronic pain and medication refill. She states her pain is located in her lower back radiating into her right lower extremity and right foot with tingling and burning. She also reports she has Fibro pain and generalized joint pain. She rates her pain 8. Her current exercise regime is walking and performing stretching exercises.  Amy Hooper Morphine equivalent is 15.00 MME.   Last UDS was Performed on 08/13/2022, it was consistent.    Pain Inventory Average Pain 8 Pain Right Now 8 My pain is constant, sharp, burning, tingling, and aching  In the last 24 hours, has pain interfered with the following? General activity 8 Relation with others 8 Enjoyment of life 8 What TIME of day is your pain at its worst? morning  and evening Sleep (in general) Fair  Pain is worse with: walking, bending, sitting, inactivity, standing, and some activites Pain improves with: heat/ice, therapy/exercise, medication, and TENS Relief from Meds: 3  Family History  Problem Relation Age of Onset   Hypothyroidism Mother    Hyperlipidemia Other    Kidney failure Father    Dementia Father    Gout Father    Arthritis Father    Prostate cancer Father    Social History   Socioeconomic History   Marital status: Married    Spouse name: Not on file   Number of children: 3   Years of education: Not on file   Highest education level: Bachelor's degree (e.g., BA, AB, BS)  Occupational History   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     Social Determinants of Health   Financial Resource Strain: Low Risk  (03/01/2021)   Overall Financial Resource Strain (CARDIA)    Difficulty of Paying Living Expenses: Not hard at all  Food Insecurity: No Food Insecurity (03/01/2021)   Hunger Vital Sign    Worried About Running Out of Food in the Last Year: Never true    Ran Out of Food in the Last Year: Never true  Transportation Needs: No Transportation Needs (03/01/2021)   PRAPARE - Administrator, Civil Service (Medical): No    Lack of Transportation (Non-Medical): No  Physical Activity: Unknown (03/01/2021)   Exercise Vital Sign    Days of Exercise per Week: 0 days    Minutes of Exercise per Session: Not on file  Stress: No Stress Concern Present (03/01/2021)   [employer] of Occupational Health - Occupational Stress Questionnaire    Feeling of Stress : Only a little  Social Connections: Moderately Isolated (03/01/2021)   Social Connection and Isolation Panel [NHANES]    Frequency of Communication with Friends and Family: More than three times a week    Frequency of Social Gatherings with Friends and Family: Once a week    Attends Religious Services: Never    Database administrator or Organizations: No  Attends Banker Meetings: Not on file    Marital Status: Married   Past Surgical History:  Procedure Laterality Date   APPLICATION OF WOUND VAC N/A 03/25/2021   Procedure: APPLICATION OF WOUND VAC;  Surgeon: Eldred Manges, MD;  Location: [facility];  Service: Orthopedics;  Laterality: N/A;   APPLICATION OF WOUND VAC N/A 03/30/2021   Procedure: WOUND VAC CHANGE 12x6x5;  Surgeon: Eldred Manges, MD;  Location: [facility];  Service: Orthopedics;  Laterality: N/A;   INCISION AND DRAINAGE OF WOUND N/A 03/25/2021   Procedure: LUMBAR POST OP INCISION IRRIGATION;  Surgeon: Eldred Manges, MD;  Location: [facility];  Service: Orthopedics;  Laterality: N/A;    JOINT REPLACEMENT     KNEE ARTHROSCOPY     LUMBAR WOUND DEBRIDEMENT N/A 03/30/2021   Procedure: REPEAT LUMBAR WOUND DEBRIDEMENT;  Surgeon: Eldred Manges, MD;  Location: [facility];  Service: Orthopedics;  Laterality: N/A;   right rotator cuff     ROTATOR CUFF REPAIR Left    TOTAL KNEE ARTHROPLASTY Left 09/08/2020   Procedure: LEFT TOTAL KNEE ARTHROPLASTY;  Surgeon: Tarry Kos, MD;  Location: [facility];  Service: Orthopedics;  Laterality: Left;   TUBAL LIGATION     Past Surgical History:  Procedure Laterality Date   APPLICATION OF WOUND VAC N/A 03/25/2021   Procedure: APPLICATION OF WOUND VAC;  Surgeon: Eldred Manges, MD;  Location: [facility];  Service: Orthopedics;  Laterality: N/A;   APPLICATION OF WOUND VAC N/A 03/30/2021   Procedure: WOUND VAC CHANGE 12x6x5;  Surgeon: Eldred Manges, MD;  Location: [facility];  Service: Orthopedics;  Laterality: N/A;   INCISION AND DRAINAGE OF WOUND N/A 03/25/2021   Procedure: LUMBAR POST OP INCISION IRRIGATION;  Surgeon: Eldred Manges, MD;  Location: [facility];  Service: Orthopedics;  Laterality: N/A;   JOINT REPLACEMENT     KNEE ARTHROSCOPY     LUMBAR WOUND DEBRIDEMENT N/A 03/30/2021   Procedure: REPEAT LUMBAR WOUND DEBRIDEMENT;  Surgeon: Eldred Manges, MD;  Location: [facility];  Service: Orthopedics;  Laterality: N/A;   right rotator cuff     ROTATOR CUFF REPAIR Left    TOTAL KNEE ARTHROPLASTY Left 09/08/2020   Procedure: LEFT TOTAL KNEE ARTHROPLASTY;  Surgeon: Tarry Kos, MD;  Location: [facility];  Service: Orthopedics;  Laterality: Left;   TUBAL LIGATION     Past Medical History:  Diagnosis Date   Chronic kidney disease    Depression    Fibromyalgia    Gout    HTN (hypertension)    Insomnia    Morbid obesity (HCC)    Non-recurrent acute suppurative otitis media of left ear without spontaneous rupture of tympanic membrane 04/30/2022   Osteoarthritis    Palpitation    Sleep apnea    BP 120/69   Pulse 75   Ht 5\' 2"  (1.575 m)   Wt 207 lb (93.9 kg)   SpO2 98%   BMI  37.86 kg/m   Opioid Risk Score:   Fall Risk Score:  `1  Depression screen [facility] 2/9     10/14/2022    9:59 AM 09/06/2022    9:47 AM 08/13/2022   10:04 AM 06/28/2022   10:01 AM 04/22/2022    9:31 AM 03/30/2022   12:54 PM 03/30/2022   12:52 PM  Depression screen PHQ 2/9  Decreased Interest 0 1 0 0 0 1 0  Down, Depressed, Hopeless 0 1 0 0 0 1   PHQ - 2 Score  0 2 0 0 0 2 0      Review of Systems  Musculoskeletal:  Positive for back pain.       B/Lfoot RT thigh   All other systems reviewed and are negative.      Objective:   Physical Exam Vitals and nursing note reviewed.  Constitutional:      Appearance: Normal appearance. She is obese.  Cardiovascular:     Rate and Rhythm: Normal rate and regular rhythm.     Pulses: Normal pulses.     Heart sounds: Normal heart sounds.  Pulmonary:     Effort: Pulmonary effort is normal.     Breath sounds: Normal breath sounds.  Musculoskeletal:     Cervical back: Normal range of motion and neck supple.     Comments: Normal Muscle Bulk and Muscle Testing Reveals:  Upper Extremities: Full ROM and Muscle Strength 5/5 Right AC Joint Tenderness  Thoracic and Lumbar Hypersensitivity Right Greater Trochanter Tenderness Lower Extremities: Full ROM and Muscle Strength 5/5 Arises from Table slowly using cane for support Antalgic  Gait     Skin:    General: Skin is warm and dry.  Neurological:     Mental Status: She is alert and oriented to person, place, and time.  Psychiatric:        Mood and Affect: Mood normal.        Behavior: Behavior normal.         Assessment & Plan:  1, Right Lumbar Radiculitis: Continue Pregabalin.  Neurosurgeon at [facility] following. Continue to Monitor. 11/05/2022 Chronic Bilateral Low Back Pain: S/P Lumbar Fusion: Dr Ophelia Charter Following. Continue HEP as Tolerated. Continue to Monitor. 11/05/2022 2.. Post Operative Infection: ID Following: Continue to Monitor. 11/05/2022 3. Fibromyalgia: Continue HEP as  Tolerated. Continue Lyrica. Continue to Monitor. 11/05/2022 4. Primary Osteoarthritis: Left Knee: S/P on 09/08/20: Dr Roda Shutters: LEFT TOTAL KNEE ARTHROPLASTY. Continue HEP as Tolerated. Continue to Monitor.  5. Primary Osteoarthritis Right Knee: Continue HEP as Tolerated. Ortho Following. Continue to Monitor. 11/05/2022 6. Chronic Pain Syndrome: Refilled: Hydrocodone 7.5 mg /325 one tablet twice a day as needed for pain #60. Continue Amitriptyline  We will continue the opioid monitoring program, this consists of regular clinic visits, examinations, urine drug screen, pill counts as well as use of [place] Controlled Substance Reporting system. A 12 month History has been reviewed on the [place] Controlled Substance Reporting System on 11/05/2022 7. Depression: Continue Cymbalta. Continue to Monitor. 11/05/2022  F/U in 1 month

## 2022-11-09 ENCOUNTER — Ambulatory Visit: Payer: 59 | Admitting: Registered Nurse

## 2022-11-11 ENCOUNTER — Encounter: Payer: Self-pay | Admitting: Nurse Practitioner

## 2022-11-11 ENCOUNTER — Other Ambulatory Visit: Payer: Self-pay

## 2022-11-11 MED ORDER — ALLOPURINOL 100 MG PO TABS: 200.0000 mg | ORAL_TABLET | Freq: Every day | ORAL | 1 refills | Status: DC

## 2022-11-12 ENCOUNTER — Other Ambulatory Visit (HOSPITAL_COMMUNITY): Payer: Self-pay | Admitting: Neurosurgery

## 2022-11-12 DIAGNOSIS — R200 Anesthesia of skin: Secondary | ICD-10-CM

## 2022-11-12 DIAGNOSIS — Z981 Arthrodesis status: Secondary | ICD-10-CM

## 2022-11-15 ENCOUNTER — Ambulatory Visit: Payer: 59 | Admitting: Internal Medicine

## 2022-11-15 ENCOUNTER — Ambulatory Visit (HOSPITAL_BASED_OUTPATIENT_CLINIC_OR_DEPARTMENT_OTHER)
Admission: RE | Admit: 2022-11-15 | Discharge: 2022-11-15 | Disposition: A | Discharge status: Home / Self Care | Payer: 59 | Source: Ambulatory Visit | Attending: Neurosurgery | Admitting: Neurosurgery

## 2022-11-15 ENCOUNTER — Encounter: Payer: Self-pay | Admitting: Internal Medicine

## 2022-11-15 ENCOUNTER — Other Ambulatory Visit: Payer: Self-pay

## 2022-11-15 VITALS — BP 123/71 | HR 60 | Temp 97.8°F | Ht 62.0 in | Wt 206.0 lb

## 2022-11-15 DIAGNOSIS — M4626 Osteomyelitis of vertebra, lumbar region: Secondary | ICD-10-CM

## 2022-11-15 DIAGNOSIS — Z981 Arthrodesis status: Secondary | ICD-10-CM | POA: Diagnosis present

## 2022-11-15 DIAGNOSIS — R200 Anesthesia of skin: Secondary | ICD-10-CM | POA: Insufficient documentation

## 2022-11-15 NOTE — Progress Notes (Signed)
Patient Active Problem List   Diagnosis Date Noted   Forearm tendonitis 07/23/2022   Rash and nonspecific skin eruption 07/23/2022   Asymptomatic bradycardia 07/23/2022   Rheumatoid arthritis with rheumatoid factor of multiple sites without organ or systems involvement (HCC) 04/30/2022   Osteopenia 03/03/2022   Degenerative spondylolisthesis 02/19/2021   Status post total left knee replacement 09/08/2020   OSA (obstructive sleep apnea) 05/19/2020   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/17/2020   Chronic pain syndrome 03/03/2020   S/P lumbar fusion 02/05/2020   Vitamin D deficiency 01/09/2020   Hyperlipidemia 01/09/2020   CKD (chronic kidney disease) stage 3, GFR 30-59 ml/min (HCC) 01/09/2020   HTN (hypertension)    Morbid obesity (HCC)    Gout    Depression    Insomnia    Chronically low serum potassium 04/22/2014   Environmental and seasonal allergies 04/22/2014   Palpitations 04/22/2014    Patient's Medications  New Prescriptions   No medications on file  Previous Medications   ALLOPURINOL (ZYLOPRIM) 100 MG TABLET    Take 2 tablets (200 mg total) by mouth daily.   AMLODIPINE-VALSARTAN (EXFORGE) 5-160 MG TABLET    TAKE 1 TABLET BY MOUTH EVERY DAY   DAPAGLIFLOZIN PROPANEDIOL (FARXIGA) 5 MG TABS TABLET    Take 1 tablet (5 mg total) by mouth daily.   DULOXETINE (CYMBALTA) 20 MG CAPSULE    TAKE 1 CAPSULE BY MOUTH DAILY   ESZOPICLONE (LUNESTA) 1 MG TABS TABLET    Take 1 tablet (1 mg total) by mouth at bedtime as needed for sleep. Take immediately before bedtime   FLUTICASONE (FLONASE) 50 MCG/ACT NASAL SPRAY    Place 1 spray into both nostrils daily as needed for allergies.   HYDROCODONE-ACETAMINOPHEN (NORCO) 7.5-325 MG TABLET    Take 1 tablet by mouth 2 (two) times daily as needed for moderate pain.   HYDROCORTISONE (ANUSOL-HC) 2.5 % RECTAL CREAM    PLACE 1 APPLICATION RECTALLY 2 (TWO) TIMES DAILY.   KETOCONAZOLE (NIZORAL) 2 % CREAM    Apply 1  Application topically daily. Apply to rash twice a day. If no improvement after 2 weeks, please let provider know.   LIDOCAINE (LIDODERM) 5 %    Place 1 patch onto the skin daily. Remove & Discard patch within 12 hours or as directed by MD   LORATADINE (CLARITIN) 10 MG TABLET    Take 10 mg by mouth daily as needed for allergies.   MOMETASONE (ELOCON) 0.1 % CREAM    Apply very thin layer to rash at bedtime. May use one other time during the day if needed. Stop after 7 days, if still needed may repeat after 3 day break.   PREGABALIN (LYRICA) 75 MG CAPSULE    Take 1 capsule (75 mg total) by mouth 2 (two) times daily.   VITAMIN D, ERGOCALCIFEROL, (DRISDOL) 1.25 MG (50000 UNIT) CAPS CAPSULE    TAKE 1 CAPSULE (50,000 UNITS TOTAL) BY MOUTH EVERY 7 (SEVEN) DAYS  Modified Medications   No medications on file  Discontinued Medications   No medications on file    Subjective: Amy Hooper is a  72 year old female with obesity, CKD, L4-L5 stenosis anterolisthesis status post L4-5 transforaminal lumbar interbody fusion with pedicle instrumentation cage lateral fusion right L5-S1 microdiscectomy on 03/09/2021.  Patient developed drainage from surgical site infection hospitalized underwent I&D(03/25/21) of lumbar incision.  OR cultures positive for Enterobacter aeruginosa, Morganella and Bacteroides fragilis.  She underwent repeat debridement in  the OR due to purulent drainage on 03/30/21. Patient was initially on cefazolin transitioned to cefpime then metronidazole added.  Plan was to complete 2 weeks of IV therapy with cefepime and metronidazole (04/14/21) then transition to levaquin and metro to complete 8 weeks of antibiotics from OR on 03/30/21. She does have retained hardware as suuch will need suppressive antibiotics with metro and levaquin.  Interval 1/24: Changed to levaquin and metronidazole 05/19/21:Wound vac in place. Followed by Dr. Sheila Oats). Pt reports tolerating levaquin and metronidazole.   06/11/21: no new complaints 07/30/21: Presents  due to GI symptoms. Pt has loss of appetitie, loose stools 5-6 times/day. Loose stools started  in the beginning of April. Pt has been taking percocet PRN less often since April. She notices stools are more formed with percocet. Denies, fever, chills, N,V. Pt had been on probiotics, but has not been taking them since mid April.  She is on levaquin 25 mg PO qd for chronic suppression. Seen by Dr. Ophelia Charter on 4/26 and wound noted to be completely healed. Wound vac off.  8/15: reprots muscl epain throughout her body. 12/21/21: Pt she has back pain with certain activities. Dneis fever or chills. Reports no issues with tolerating suppresive antibiotics.   06/28/22: Tolerating abx. No diarrhea.   08/10/22: She has been off of abx. Pt has some back pain after gardening. No fevers or chills. 09/20/22: PT states she has back pain that radiates to behind her legs. Is going to [facility] for second opinion. Denies fevers and chills  Today 11/15/22:  Doing well, No fever or chills off of abx Review of Systems: Review of Systems  All other systems reviewed and are negative.   Past Medical History:  Diagnosis Date   Chronic kidney disease    Depression    Fibromyalgia    Gout    HTN (hypertension)    Insomnia    Morbid obesity (HCC)    Non-recurrent acute suppurative otitis media of left ear without spontaneous rupture of tympanic membrane 04/30/2022   Osteoarthritis    Palpitation    Sleep apnea     Social History   Tobacco Use   Smoking status: Never    Passive exposure: Current   Smokeless tobacco: Never  Vaping Use   Vaping status: Never Used  Substance Use Topics   Alcohol use: Never    Alcohol/week: 0.0 standard drinks of alcohol   Drug use: Never    Family History  Problem Relation Age of Onset   Hypothyroidism Mother    Hyperlipidemia Other    Kidney failure Father    Dementia Father    Gout Father    Arthritis Father    Prostate  cancer Father     Allergies  Allergen Reactions   Cefuroxime Axetil Rash and Hives   Clarithromycin Rash and Hives    Other reaction(s): Unknown   Nsaids Nausea And Vomiting    Other reaction(s): Unknown   Cefuroxime     Other reaction(s): Unknown   Robaxin [Methocarbamol] Nausea And Vomiting    "It tears my stomach up."   Toradol [Ketorolac Tromethamine] Nausea And Vomiting   Tramadol Nausea And Vomiting and Other (See Comments)    Upset stomach Other reaction(s): Unknown    Health Maintenance  Topic Date Due   Hepatitis C Screening  Never done   Zoster Vaccines- Shingrix (1 of 2) Never done   COVID-19 Vaccine (4 - 2023-24 season) 11/20/2021   INFLUENZA VACCINE  10/21/2022   MAMMOGRAM  01/01/2023   Colonoscopy  01/04/2024   DTaP/Tdap/Td (2 - Td or Tdap) 01/07/2030   Pneumonia Vaccine 68+ Years old  Completed   DEXA SCAN  Completed   HPV VACCINES  Aged Out    Objective:  There were no vitals filed for this visit. There is no height or weight on file to calculate BMI.  Physical Exam Constitutional:      Appearance: Normal appearance.  HENT:     Head: Normocephalic and atraumatic.     Right Ear: Tympanic membrane normal.     Left Ear: Tympanic membrane normal.     Nose: Nose normal.     Mouth/Throat:     Mouth: Mucous membranes are moist.  Eyes:     Extraocular Movements: Extraocular movements intact.     Conjunctiva/sclera: Conjunctivae normal.     Pupils: Pupils are equal, round, and reactive to light.  Cardiovascular:     Rate and Rhythm: Normal rate and regular rhythm.     Heart sounds: No murmur heard.    No friction rub. No gallop.  Pulmonary:     Effort: Pulmonary effort is normal.     Breath sounds: Normal breath sounds.  Abdominal:     General: Abdomen is flat.     Palpations: Abdomen is soft.  Musculoskeletal:        General: Normal range of motion.  Skin:    General: Skin is warm and dry.  Neurological:     General: No focal deficit  present.     Mental Status: She is alert and oriented to person, place, and time.  Psychiatric:        Mood and Affect: Mood normal.    Surgical scar, non tender Lab Results Lab Results  Component Value Date   WBC 6.7 09/20/2022   HGB 12.4 09/20/2022   HCT 38.2 09/20/2022   MCV 88.4 09/20/2022   PLT 324 09/20/2022    Lab Results  Component Value Date   CREATININE 1.30 (H) 09/20/2022   BUN 17 09/20/2022   NA 142 09/20/2022   K 4.3 09/20/2022   CL 106 09/20/2022   CO2 30 09/20/2022    Lab Results  Component Value Date   ALT 11 09/20/2022   AST 14 09/20/2022   ALKPHOS 121 07/23/2022   BILITOT 0.3 09/20/2022    Lab Results  Component Value Date   CHOL 235 (H) 07/23/2022   HDL 69 07/23/2022   LDLCALC 148 (H) 07/23/2022   TRIG 101 07/23/2022   CHOLHDL 3.4 07/23/2022   No results found for: "LABRPR", "RPRTITER" No results found for: "HIV1RNAQUANT", "HIV1RNAVL", "CD4TABS"   Problem List Items Addressed This Visit   None  Assessment/Plan #Lumbar wound infection with retained hardware SP I&D on 03/25/21  #OR Cx from 03/25/21 +  enterobacter aeruginosa, Morganella morganii,  bacteroides fragilis(beta lactamase positive) #Medication management -Completed 2 weeks of cefepime from OR on 1/24 and started on levaquin 750mg  PO q48h(renally dosed) and  metronidazole 500mg  PO bid(EOT 05/25/21). Then continued on levaquin and metronidazole for chronic suppression. Metronidazole changed to augmentin due to muscle soreness. Normal CK on 8/15 -labs 10/2 3.5 crp, esr 29 -[facility] Pt is tolerating antibiotics, she noted she may be getting a second surgery for her back. No tenderness note don exam, labs stable as above.  I thin kit ok to trial pt off of PO suppression. I discussed if she has fever, chills, worsening back pain to call clinic and would  likely restart antibiotics.  - On 06/28/22 stopped augmentin 500mg  PO bid and levaquin 500 per as pt  completed one year  of suppression. -5/3 esr and crp stable 34/5 respectively. 7/11 45/5.7. MRI spine showed no evience of discitis/osto. Pt followed by [facility].for right leg numbness and low back pain( distal/remote from site of surgery)->emg planned/possible epidural inj. Discussed that although no signs of infection on MRI and stable labs, inj carry a risk of introducing infection Plan - 6 months with labs   Danelle Earthly, MD [facility] 11/15/2022, 11:09 AM   I have personally spent 45 minutes involved in face-to-face and non-face-to-face activities for this patient on the day of the visit. Professional time spent includes the following activities: Preparing to see the patient (review of tests), Obtaining and/or reviewing separately obtained history (admission/discharge record), Performing a medically appropriate examination and/or evaluation , Ordering medications/tests/procedures, referring and communicating with other health care professionals, Documenting clinical information in the EMR, Independently interpreting results (not separately reported), Communicating results to the patient/family/caregiver, Counseling and educating the patient/family/caregiver and Care coordination (not separately reported).

## 2022-11-30 ENCOUNTER — Encounter: Payer: Self-pay | Admitting: Orthopaedic Surgery

## 2022-12-01 NOTE — Telephone Encounter (Signed)
Can we resubmit for gel  thanks.

## 2022-12-02 NOTE — Telephone Encounter (Signed)
VOB submitted for Monovisc, right knee. Next appointment needs to be after 12/12/2022.

## 2022-12-03 ENCOUNTER — Telehealth: Payer: Self-pay

## 2022-12-03 NOTE — Telephone Encounter (Signed)
-----   Message from [place], [place] E sent at 12/03/2022  1:36 PM EDT ----- At this time we are not performing nerve conduction studies outside of our practice. Please call and notify referring office. ----- Message ----- From: Sharlet Salina, CMA Sent: 12/03/2022  12:54 PM EDT To: Tyrell Antonio, MD; Juanda Chance, NP   ----- Message ----- From: Mendel Corning Sent: 12/03/2022  12:19 PM EDT To: Sharlet Salina, CMA  Referral for EMG ----- Message ----- From: Bridgett Larsson Sent: 12/02/2022   4:36 PM EDT To: Mendel Corning  Referral from [facility]   Thank you Morrison Old

## 2022-12-03 NOTE — Telephone Encounter (Signed)
Left detailed voicemail informing clinic that Dr. Alvester Morin does not perform lower extremity EMGs. Informed them that he refers to [facility] or [facility]

## 2022-12-05 ENCOUNTER — Other Ambulatory Visit: Payer: Self-pay | Admitting: Physical Medicine and Rehabilitation

## 2022-12-13 ENCOUNTER — Encounter: Payer: Self-pay | Admitting: Registered Nurse

## 2022-12-13 ENCOUNTER — Encounter: Payer: 59 | Attending: Registered Nurse | Admitting: Registered Nurse

## 2022-12-13 VITALS — BP 120/71 | HR 56 | Ht 62.0 in | Wt 207.0 lb

## 2022-12-13 DIAGNOSIS — G894 Chronic pain syndrome: Secondary | ICD-10-CM

## 2022-12-13 DIAGNOSIS — M5412 Radiculopathy, cervical region: Secondary | ICD-10-CM | POA: Diagnosis present

## 2022-12-13 DIAGNOSIS — M5416 Radiculopathy, lumbar region: Secondary | ICD-10-CM

## 2022-12-13 DIAGNOSIS — M542 Cervicalgia: Secondary | ICD-10-CM | POA: Diagnosis present

## 2022-12-13 DIAGNOSIS — M2550 Pain in unspecified joint: Secondary | ICD-10-CM | POA: Diagnosis present

## 2022-12-13 DIAGNOSIS — M546 Pain in thoracic spine: Secondary | ICD-10-CM | POA: Diagnosis present

## 2022-12-13 DIAGNOSIS — M7062 Trochanteric bursitis, left hip: Secondary | ICD-10-CM | POA: Diagnosis present

## 2022-12-13 DIAGNOSIS — M7061 Trochanteric bursitis, right hip: Secondary | ICD-10-CM

## 2022-12-13 DIAGNOSIS — G8929 Other chronic pain: Secondary | ICD-10-CM | POA: Diagnosis present

## 2022-12-13 DIAGNOSIS — Z79891 Long term (current) use of opiate analgesic: Secondary | ICD-10-CM

## 2022-12-13 DIAGNOSIS — M797 Fibromyalgia: Secondary | ICD-10-CM

## 2022-12-13 DIAGNOSIS — Z5181 Encounter for therapeutic drug level monitoring: Secondary | ICD-10-CM

## 2022-12-13 DIAGNOSIS — R001 Bradycardia, unspecified: Secondary | ICD-10-CM

## 2022-12-13 MED ORDER — PREGABALIN 75 MG PO CAPS: 75.0000 mg | ORAL_CAPSULE | Freq: Two times a day (BID) | ORAL | 1 refills | Status: DC

## 2022-12-13 MED ORDER — HYDROCODONE-ACETAMINOPHEN 7.5-325 MG PO TABS: 1.0000 | ORAL_TABLET | Freq: Three times a day (TID) | ORAL | 0 refills | Status: DC | PRN

## 2022-12-13 NOTE — Progress Notes (Signed)
Subjective:    Patient ID: Amy Hooper, female    DOB: March 22, 1951, 72 y.o.   MRN: [id number]  VHQ:[id number] Amy Hooper is a 72 y.o. female who returns for follow up appointment for chronic pain and medication refill. She states her pain is located in neck radiating into her bilateral shoulders, upper- lower back radiating into right lower extremity and she also reports bilateral hip pain. She also reports generalized joint pain. Amy Hooper reports she is only receving two hours of relief with her current medication regimen.   She rates her pain 8. Her current exercise regime is walking and performing stretching exercises.  Amy Hooper arrived to office with Bradycardia, apical pulse checked. She has a cardiology appointment on 12/20/2022 and PCP Following.   Amy Hooper equivalent is 15.00 MME.   UDS ordered today    Pain Inventory Average Pain 8 Pain Right Now 8 My pain is sharp, burning, dull, tingling, and aching  In the last 24 hours, has pain interfered with the following? General activity 8 Relation with others 0 Enjoyment of life 8 What TIME of day is your pain at its worst? morning  and night Sleep (in general) Poor  Pain is worse with: walking, bending, sitting, inactivity, standing, and some activites Pain improves with: rest, heat/ice, medication, TENS, and injections Relief from Meds: 4  Family History  Problem Relation Age of Onset   Hypothyroidism Mother    Hyperlipidemia Other    Kidney failure Father    Dementia Father    Gout Father    Arthritis Father    Prostate cancer Father    Social History   Socioeconomic History   Marital status: Married    Spouse name: Not on file   Number of children: 3   Years of education: Not on file   Highest education level: Bachelor's degree (e.g., BA, AB, BS)  Occupational History   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     Social Determinants of Health   Financial Resource Strain: Low Risk  (03/01/2021)   Overall Financial Resource Strain (CARDIA)    Difficulty of Paying Living Expenses: Not hard at all  Food Insecurity: No Food Insecurity (03/01/2021)   Hunger Vital Sign    Worried About Running Out of Food in the Last Year: Never true    Ran Out of Food in the Last Year: Never true  Transportation Needs: No Transportation Needs (03/01/2021)   PRAPARE - Administrator, Civil Service (Medical): No    Lack of Transportation (Non-Medical): No  Physical Activity: Unknown (03/01/2021)   Exercise Vital Sign    Days of Exercise per Week: 0 days    Minutes of Exercise per Session: Not on file  Stress: No Stress Concern Present (03/01/2021)   [employer] of Occupational Health - Occupational Stress Questionnaire    Feeling of Stress : Only a little  Social Connections: Moderately Isolated (03/01/2021)   Social Connection and Isolation Panel [NHANES]    Frequency of Communication with Friends and Family: More than three times  a week    Frequency of Social Gatherings with Friends and Family: Once a week    Attends Religious Services: Never    Database administrator or Organizations: No    Attends Engineer, structural: Not on file    Marital Status: Married   Past Surgical History:  Procedure Laterality Date   APPLICATION OF WOUND VAC N/A 03/25/2021   Procedure: APPLICATION OF WOUND VAC;  Surgeon: Eldred Manges, MD;  [facility]: [facility];  Service: Orthopedics;  Laterality: N/A;   APPLICATION OF WOUND VAC N/A 03/30/2021   Procedure: WOUND VAC CHANGE 12x6x5;  Surgeon: Eldred Manges, MD;  Location: [facility];  Service:  Orthopedics;  Laterality: N/A;   INCISION AND DRAINAGE OF WOUND N/A 03/25/2021   Procedure: LUMBAR POST OP INCISION IRRIGATION;  Surgeon: Eldred Manges, MD;  Location: [facility];  Service: Orthopedics;  Laterality: N/A;   JOINT REPLACEMENT     KNEE ARTHROSCOPY     LUMBAR WOUND DEBRIDEMENT N/A 03/30/2021   Procedure: REPEAT LUMBAR WOUND DEBRIDEMENT;  Surgeon: Eldred Manges, MD;  Location: [facility];  Service: Orthopedics;  Laterality: N/A;   right rotator cuff     ROTATOR CUFF REPAIR Left    TOTAL KNEE ARTHROPLASTY Left 09/08/2020   Procedure: LEFT TOTAL KNEE ARTHROPLASTY;  Surgeon: Tarry Kos, MD;  [facility]: [facility];  Service: Orthopedics;  Laterality: Left;   TUBAL LIGATION     Past Surgical History:  Procedure Laterality Date   APPLICATION OF WOUND VAC N/A 03/25/2021   Procedure: APPLICATION OF WOUND VAC;  Surgeon: Eldred Manges, MD;  [facility];  Service: Orthopedics;  Laterality: N/A;   APPLICATION OF WOUND VAC N/A 03/30/2021   Procedure: WOUND VAC CHANGE 12x6x5;  Surgeon: Eldred Manges, MD;  [facility];  Service: Orthopedics;  Laterality: N/A;   INCISION AND DRAINAGE OF WOUND N/A 03/25/2021   Procedure: LUMBAR POST OP INCISION IRRIGATION;  Surgeon: Eldred Manges, MD;  [facility];  Service: Orthopedics;  Laterality: N/A;   JOINT REPLACEMENT     KNEE ARTHROSCOPY     LUMBAR WOUND DEBRIDEMENT N/A 03/30/2021   Procedure: REPEAT LUMBAR WOUND DEBRIDEMENT;  Surgeon: Eldred Manges, MD;  [facility];  Service: Orthopedics;  Laterality: N/A;   right rotator cuff     ROTATOR CUFF REPAIR Left    TOTAL KNEE ARTHROPLASTY Left 09/08/2020   Procedure: LEFT TOTAL KNEE ARTHROPLASTY;  Surgeon: Tarry Kos, MD;  [facility];  Service: Orthopedics;  Laterality: Left;   TUBAL LIGATION     Past Medical History:  Diagnosis Date   Chronic kidney disease    Depression    Fibromyalgia    Gout    HTN (hypertension)    Insomnia    Morbid obesity (HCC)    Non-recurrent acute suppurative otitis  media of left ear without spontaneous rupture of tympanic membrane 04/30/2022   Osteoarthritis    Palpitation    Sleep apnea    There were no vitals taken for this visit.  Opioid Risk Score:   Fall Risk Score:  `1  Depression screen PHQ 2/9     11/15/2022   11:12 AM 11/05/2022    1:10 PM 10/14/2022    9:59 AM 09/06/2022    9:47 AM 08/13/2022   10:04 AM 06/28/2022   10:01 AM 04/22/2022    9:31 AM  Depression screen PHQ 2/9  Decreased Interest 0 0 0 1 0 0 0  Down, Depressed, Hopeless 0 0 0 1 0 0 0  PHQ - 2 Score 0 0 0 2 0 0 0      Review of Systems  Musculoskeletal:  Positive for back pain and gait problem.       B/L shoulder, thigh, foot pain Right hip pain   All other systems reviewed and are negative.     Objective:   Physical Exam Vitals and nursing note reviewed.  Constitutional:      Appearance: Normal appearance.  Neck:     Comments: Cervical Paraspinal Tenderness: C-5- C-6 Cardiovascular:     Rate and Rhythm: Normal rate and regular rhythm.     Pulses: Normal pulses.     Heart sounds: Normal heart sounds.  Musculoskeletal:     Cervical back: Normal range of motion and neck supple.     Comments: Normal Muscle Bulk and Muscle Testing Reveals:  Upper Extremities: Full ROM and Muscle Strength 5/5 Bilateral AC Joint Tenderness  Thoracic and Lumbar Hypersensitivity Bilateral Greater Trochanter Tenderness Lower Extremities: Full ROM and Muscle Strength 5/5 Arises from chair slowly using cane for support Narrow Based  Gait     Skin:    General: Skin is warm and dry.  Neurological:     Mental Status: She is alert.         Assessment & Plan:  1, Right Lumbar Radiculitis: Continue Pregabalin.  Neurosurgeon at [facility] following. Continue to Monitor. 12/13/2022 Chronic Bilateral Low Back Pain: S/P Lumbar Fusion: Dr Ophelia Charter Following. Continue HEP as Tolerated. Continue to Monitor. 12/13/2022 2.. Post Operative Infection: ID Following: Continue to Monitor.  12/13/2022 3. Fibromyalgia: Continue HEP as Tolerated. Continue Lyrica. Continue to Monitor. 12/13/2022 4. Primary Osteoarthritis: Left Knee: S/P on 09/08/20: Dr Roda Shutters: LEFT TOTAL KNEE ARTHROPLASTY. Continue HEP as Tolerated. Continue to Monitor.  5. Primary Osteoarthritis Right Knee: Continue HEP as Tolerated. Ortho Following. Continue to Monitor. 12/13/2022 6. Chronic Pain Syndrome: Refilled: Increased Hydrocodone 7.5 mg /325 one tablet three times a  a day as needed for pain #90. Continue Amitriptyline  We will continue the opioid monitoring program, this consists of regular clinic visits, examinations, urine drug screen, pill counts as well as use of [place] Controlled Substance Reporting system. A 12 month History has been reviewed on the [place] Controlled Substance Reporting System on 11/05/2022 7. Depression: Continue Cymbalta. Continue to Monitor. 12/13/2022  8. Bradycardia: Apical Pusle Check : PCP Following, she has a Cardiology appointment on 12/20/2022  F/U in 1 month

## 2022-12-13 NOTE — Patient Instructions (Signed)
Hydrocodone was increased to three times a day as needed for pain.

## 2022-12-14 ENCOUNTER — Ambulatory Visit: Payer: 59 | Admitting: Physician Assistant

## 2022-12-14 ENCOUNTER — Encounter: Payer: Self-pay | Admitting: Physician Assistant

## 2022-12-14 DIAGNOSIS — M1711 Unilateral primary osteoarthritis, right knee: Secondary | ICD-10-CM | POA: Diagnosis not present

## 2022-12-14 MED ORDER — BUPIVACAINE HCL 0.25 % IJ SOLN
2.0000 mL | INTRAMUSCULAR | Status: AC | PRN
Start: 2022-12-14 — End: 2022-12-14
  Administered 2022-12-14: 2 mL via INTRA_ARTICULAR

## 2022-12-14 MED ORDER — LIDOCAINE HCL 1 % IJ SOLN
2.0000 mL | INTRAMUSCULAR | Status: AC | PRN
Start: 2022-12-14 — End: 2022-12-14
  Administered 2022-12-14: 2 mL

## 2022-12-14 MED ORDER — HYALURONAN 88 MG/4ML IX SOSY
88.0000 mg | PREFILLED_SYRINGE | INTRA_ARTICULAR | Status: AC | PRN
Start: 2022-12-14 — End: 2022-12-14
  Administered 2022-12-14: 88 mg via INTRA_ARTICULAR

## 2022-12-14 NOTE — Progress Notes (Signed)
Office Visit Note   [name]           Date of Birth: [birth year]           MRN: [id number] Visit Date: 12/14/2022              Requested by: Tollie Eth, NP [address] [place],  [place] [address] PCP: Tollie Eth, NP   Assessment & Plan: Visit Diagnoses:  1. Unilateral primary osteoarthritis, right knee     Plan: Impression is right knee osteoarthritis.  Today, proceeded with right knee Monovisc injection.  She tolerated this well.  She will follow-up as needed.  Follow-Up Instructions: Return if symptoms worsen or fail to improve.   Orders:  Orders Placed This Encounter  Procedures   Large Joint Inj   No orders of the defined types were placed in this encounter.     Procedures: Large Joint Inj: R knee on 12/14/2022 8:38 AM Indications: pain Details: 22 G needle, anterolateral approach Medications: 2 mL lidocaine 1 %; 2 mL bupivacaine 0.25 %; 88 mg Hyaluronan 88 MG/4ML      Clinical Data: No additional findings.   Subjective: Chief Complaint  Patient presents with   Right Knee - Follow-up    Monovisc    HPI patient is a pleasant 72 year old female with underlying right knee osteoarthritis who comes in today for Monovisc injection to the right knee.  Last viscosupplementation injection was on 06/11/2022 which provided about 5 months relief.     Objective: Vital Signs: There were no vitals taken for this visit.    Ortho Exam stable right knee exam  Specialty Comments:  No specialty comments available.  Imaging: No new imaging   PMFS History: Patient Active Problem List   Diagnosis Date Noted   Forearm tendonitis 07/23/2022   Rash and nonspecific skin eruption 07/23/2022   Asymptomatic bradycardia 07/23/2022   Rheumatoid arthritis with rheumatoid factor of multiple sites without organ or systems involvement (HCC) 04/30/2022   Osteopenia 03/03/2022   Degenerative spondylolisthesis 02/19/2021   Status post  total left knee replacement 09/08/2020   OSA (obstructive sleep apnea) 05/19/2020   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/17/2020   Chronic pain syndrome 03/03/2020   S/P lumbar fusion 02/05/2020   Vitamin D deficiency 01/09/2020   Hyperlipidemia 01/09/2020   CKD (chronic kidney disease) stage 3, GFR 30-59 ml/min (HCC) 01/09/2020   HTN (hypertension)    Morbid obesity (HCC)    Gout    Depression    Insomnia    Chronically low serum potassium 04/22/2014   Environmental and seasonal allergies 04/22/2014   Palpitations 04/22/2014   Past Medical History:  Diagnosis Date   Chronic kidney disease    Depression    Fibromyalgia    Gout    HTN (hypertension)    Insomnia    Morbid obesity (HCC)    Non-recurrent acute suppurative otitis media of left ear without spontaneous rupture of tympanic membrane 04/30/2022   Osteoarthritis    Palpitation    Sleep apnea     Family History  Problem Relation Age of Onset   Hypothyroidism Mother    Hyperlipidemia Other    Kidney failure Father    Dementia Father    Gout Father    Arthritis Father    Prostate cancer Father     Past Surgical History:  Procedure Laterality Date   APPLICATION OF WOUND VAC N/A 03/25/2021   Procedure: APPLICATION OF WOUND VAC;  Surgeon: Ophelia Charter,  Veverly Fells, MD;  [facility];  Service: Orthopedics;  Laterality: N/A;   APPLICATION OF WOUND VAC N/A 03/30/2021   Procedure: WOUND VAC CHANGE 12x6x5;  Surgeon: Eldred Manges, MD;  [facility];  Service: Orthopedics;  Laterality: N/A;   INCISION AND DRAINAGE OF WOUND N/A 03/25/2021   Procedure: LUMBAR POST OP INCISION IRRIGATION;  Surgeon: Eldred Manges, MD;  [facility];  Service: Orthopedics;  Laterality: N/A;   JOINT REPLACEMENT     KNEE ARTHROSCOPY     LUMBAR WOUND DEBRIDEMENT N/A 03/30/2021   Procedure: REPEAT LUMBAR WOUND DEBRIDEMENT;  Surgeon: Eldred Manges, MD;  [facility];  Service: Orthopedics;  Laterality: N/A;   right rotator cuff      ROTATOR CUFF REPAIR Left    TOTAL KNEE ARTHROPLASTY Left 09/08/2020   Procedure: LEFT TOTAL KNEE ARTHROPLASTY;  Surgeon: Tarry Kos, MD;  [facility]: [facility];  Service: Orthopedics;  Laterality: Left;   TUBAL LIGATION     Social History   Occupational History   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

## 2022-12-17 LAB — TOXASSURE SELECT,+ANTIDEPR,UR

## 2022-12-20 ENCOUNTER — Ambulatory Visit: Payer: 59 | Attending: Internal Medicine

## 2022-12-20 ENCOUNTER — Encounter: Payer: Self-pay | Admitting: Internal Medicine

## 2022-12-20 ENCOUNTER — Ambulatory Visit: Payer: 59 | Attending: Internal Medicine | Admitting: Internal Medicine

## 2022-12-20 VITALS — BP 112/67 | HR 64 | Ht 62.0 in | Wt 206.0 lb

## 2022-12-20 DIAGNOSIS — R002 Palpitations: Secondary | ICD-10-CM

## 2022-12-20 DIAGNOSIS — G4733 Obstructive sleep apnea (adult) (pediatric): Secondary | ICD-10-CM | POA: Diagnosis not present

## 2022-12-20 DIAGNOSIS — I10 Essential (primary) hypertension: Secondary | ICD-10-CM | POA: Diagnosis not present

## 2022-12-20 DIAGNOSIS — N1831 Chronic kidney disease, stage 3a: Secondary | ICD-10-CM

## 2022-12-20 DIAGNOSIS — I491 Atrial premature depolarization: Secondary | ICD-10-CM

## 2022-12-20 NOTE — Patient Instructions (Addendum)
Medication Instructions:  Your physician recommends that you continue on your current medications as directed. Please refer to the Current Medication list given to you today.  *If you need a refill on your cardiac medications before your next appointment, please call your pharmacy*   Lab Work: JAN 2025: FLP, Lpa (nothing to eat or drink 12 hours prior except water)  If you have labs (blood work) drawn today and your tests are completely normal, you will receive your results only by: MyChart Message (if you have MyChart) OR A paper copy in the mail If you have any lab test that is abnormal or we need to change your treatment, we will call you to review the results.   Testing/Procedures: Your physician has requested that you wear a heart monitor.    Your physician has requested that you have an echocardiogram. Echocardiography is a painless test that uses sound waves to create images of your heart. It provides your doctor with information about the size and shape of your heart and how well your heart's chambers and valves are working. This procedure takes approximately one hour. There are no restrictions for this procedure. Please do NOT wear cologne, perfume, aftershave, or lotions (deodorant is allowed). Please arrive 15 minutes prior to your appointment time.    Follow-Up: At [facility], you and your health needs are our priority.  As part of our continuing mission to provide you with exceptional heart care, we have created designated Provider Care Teams.  These Care Teams include your primary Cardiologist (physician) and Advanced Practice Providers (APPs -  Physician Assistants and Nurse Practitioners) who all work together to provide you with the care you need, when you need it.  We recommend signing up for the patient portal called "MyChart".  Sign up information is provided on this After Visit Summary.  MyChart is used to connect with patients for Virtual Visits  (Telemedicine).  Patients are able to view lab/test results, encounter notes, upcoming appointments, etc.  Non-urgent messages can be sent to your provider as well.   To learn more about what you can do with MyChart, go to [email or web address].    Your next appointment:   1 year(s)  Provider:  Riley Lam, MD   Other Instructions Amy Hooper- Long Term Monitor Instructions  Your physician has requested you wear a ZIO patch monitor for 3 days.  This is a single patch monitor. [employer] supplies one patch monitor per enrollment. Additional stickers are not available. Please do not apply patch if you will be having a Nuclear Stress Test,  Echocardiogram, Cardiac CT, MRI, or Chest Xray during the period you would be wearing the  monitor. The patch cannot be worn during these tests. You cannot remove and re-apply the  ZIO XT patch monitor.  Your ZIO patch monitor will be mailed 3 day USPS to your address on file. It may take 3-5 days  to receive your monitor after you have been enrolled.  Once you have received your monitor, please review the enclosed instructions. Your monitor  has already been registered assigning a specific monitor serial # to you.  Billing and Patient Assistance Program Information  We have supplied [employer] with any of your insurance information on file for billing purposes. [employer] offers a sliding scale Patient Assistance Program for patients that do not have  insurance, or whose insurance does not completely cover the cost of the ZIO monitor.  You must apply for the Patient Assistance Program to qualify for this  discounted rate.  To apply, please call [employer] at [phone], select option 4, select option 2, ask to apply for  Patient Assistance Program. Amy Hooper will ask your household income, and how many people  are in your household. They will quote your out-of-pocket cost based on that information.  [employer] will also be able to set up a 26-month,  interest-free payment plan if needed.  Applying the monitor   Shave hair from upper left chest.  Hold abrader disc by orange tab. Rub abrader in 40 strokes over the upper left chest as  indicated in your monitor instructions.  Clean area with 4 enclosed alcohol pads. Let dry.  Apply patch as indicated in monitor instructions. Patch will be placed under collarbone on left  side of chest with arrow pointing upward.  Rub patch adhesive wings for 2 minutes. Remove white label marked "1". Remove the white  label marked "2". Rub patch adhesive wings for 2 additional minutes.  While looking in a mirror, press and release button in center of patch. A small green light will  flash 3-4 times. This will be your only indicator that the monitor has been turned on.  Do not shower for the first 24 hours. You may shower after the first 24 hours.  Press the button if you feel a symptom. You will hear a small click. Record Date, Time and  Symptom in the Patient Logbook.  When you are ready to remove the patch, follow instructions on the last 2 pages of Patient  Logbook. Stick patch monitor onto the last page of Patient Logbook.  Place Patient Logbook in the blue and white box. Use locking tab on box and tape box closed  securely. The blue and white box has prepaid postage on it. Please place it in the mailbox as  soon as possible. Your physician should have your test results approximately 7 days after the  monitor has been mailed back to [facility].  Call [facility] Customer Care at [phone] if you have questions regarding  your ZIO XT patch monitor. Call them immediately if you see an orange light blinking on your  monitor.  If your monitor falls off in less than 4 days, contact our Monitor department at [phone].  If your monitor becomes loose or falls off after 4 days call [employer] at [phone] for  suggestions on securing your monitor

## 2022-12-20 NOTE — Progress Notes (Unsigned)
Enrolled for [employer] to mail a ZIO XT long term holter monitor to the patients address on file.  

## 2022-12-20 NOTE — Progress Notes (Signed)
Cardiology Office Note:    Date:  12/20/2022   ID:  Amy Hooper, Amy Hooper [birth year], MRN [id number]  PCP:  Amy Eth, NP   [facility] Providers Cardiologist:  Amy Constant, MD     Referring MD: Amy Eth, NP   CC: Told to come Consulted for the evaluation of Palpitations at the behest of Ms. Early, NP  History of Present Illness:    Amy Hooper is a 72 y.o. female with a hx of morbid obesity asked to see for the palpitations (2024).  Discussed the use of AI scribe software for clinical note transcription with the patient, who gave verbal consent to proceed.  History of Present Illness         The patient, a 72 year old with a history of morbid obesity, hypertension, diabetes, and hyperlipidemia, presents for evaluation of palpitations. The patient's hypertension is well managed on amlodipine and valsartan. She also takes [place] for chronic kidney disease and blood sugar management. The patient's LDL was elevated at 148 in 2014. She was previously evaluated for a lumbar wound infection, and a recent CT scan noted incidental aortic atherosclerosis.  The patient reports feeling out of breath during the hot and humid summer months, which prompted concern from her nurse practitioner due to a consistently low heart rate. An EKG performed by another nurse practitioner showed sinus rhythm.The patient denies any current symptoms of chest pain, breathing issues, or palpitations. She also denies any feelings of faintness or impending loss of consciousness. The patient is active, engaging in gardening and yard work, and reports no limitations in her activities.  The patient has a history of a systolic heart murmur, which was noted years ago but has not been evaluated since the 1980s. She also expresses concerns about starting cholesterol medication due to a fear of muscle aches, a side effect experienced by her mother. The patient prefers a more natural  approach to managing her cholesterol and is making dietary changes with the help of her husband.   Past Medical History:  Diagnosis Date   Chronic kidney disease    Depression    Fibromyalgia    Gout    HTN (hypertension)    Insomnia    Morbid obesity (HCC)    Non-recurrent acute suppurative otitis media of left ear without spontaneous rupture of tympanic membrane 04/30/2022   Osteoarthritis    Palpitation    Sleep apnea     Past Surgical History:  Procedure Laterality Date   APPLICATION OF WOUND VAC N/A 03/25/2021   Procedure: APPLICATION OF WOUND VAC;  Surgeon: Eldred Manges, MD;  Location: [facility] OR;  Service: Orthopedics;  Laterality: N/A;   APPLICATION OF WOUND VAC N/A 03/30/2021   Procedure: WOUND VAC CHANGE 12x6x5;  Surgeon: Eldred Manges, MD;  Location: [facility] OR;  Service: Orthopedics;  Laterality: N/A;   INCISION AND DRAINAGE OF WOUND N/A 03/25/2021   Procedure: LUMBAR POST OP INCISION IRRIGATION;  Surgeon: Eldred Manges, MD;  Location: [facility] OR;  Service: Orthopedics;  Laterality: N/A;   JOINT REPLACEMENT     KNEE ARTHROSCOPY     LUMBAR WOUND DEBRIDEMENT N/A 03/30/2021   Procedure: REPEAT LUMBAR WOUND DEBRIDEMENT;  Surgeon: Eldred Manges, MD;  Location: [facility] OR;  Service: Orthopedics;  Laterality: N/A;   right rotator cuff     ROTATOR CUFF REPAIR Left    TOTAL KNEE ARTHROPLASTY Left 09/08/2020   Procedure: LEFT TOTAL KNEE ARTHROPLASTY;  Surgeon: Tarry Kos, MD;  Location: [facility] OR;  Service: Orthopedics;  Laterality: Left;   TUBAL LIGATION      Current Medications: Current Meds  Medication Sig   allopurinol (ZYLOPRIM) 100 MG tablet Take 2 tablets (200 mg total) by mouth daily.   amLODipine-valsartan (EXFORGE) 5-160 MG tablet TAKE 1 TABLET BY MOUTH EVERY DAY   dapagliflozin propanediol (FARXIGA) 5 MG TABS tablet Take 1 tablet (5 mg total) by mouth daily.   DULoxetine (CYMBALTA) 20 MG capsule TAKE 1 CAPSULE BY MOUTH DAILY   eszopiclone (LUNESTA) 1 MG TABS tablet TAKE 1 TABLET  IMMEDIATELY BEFORE BEDTIME AS NEEDED FOR SLEEP   fluticasone (FLONASE) 50 MCG/ACT nasal spray Place 1 spray into both nostrils daily as needed for allergies.   HYDROcodone-acetaminophen (NORCO) 7.5-325 MG tablet Take 1 tablet by mouth 3 (three) times daily as needed for moderate pain.   hydrocortisone (ANUSOL-HC) 2.5 % rectal cream PLACE 1 APPLICATION RECTALLY 2 (TWO) TIMES DAILY.   ketoconazole (NIZORAL) 2 % cream Apply 1 Application topically daily. Apply to rash twice a day. If no improvement after 2 weeks, please let provider know.   lidocaine (LIDODERM) 5 % Place 1 patch onto the skin daily. Remove & Discard patch within 12 hours or as directed by MD   loratadine (CLARITIN) 10 MG tablet Take 10 mg by mouth daily as needed for allergies.   mometasone (ELOCON) 0.1 % cream Apply very thin layer to rash at bedtime. May use one other time during the day if needed. Stop after 7 days, if still needed may repeat after 3 day break.   pregabalin (LYRICA) 75 MG capsule Take 1 capsule (75 mg total) by mouth 2 (two) times daily.   Vitamin D, Ergocalciferol, (DRISDOL) 1.25 MG (50000 UNIT) CAPS capsule TAKE 1 CAPSULE (50,000 UNITS TOTAL) BY MOUTH EVERY 7 (SEVEN) DAYS   Current Facility-Administered Medications for the 12/20/22 encounter (Office Visit) with Amy Constant, MD  Medication   capsaicin topical system 8 % patch 1 patch     Allergies:   Cefuroxime axetil, Clarithromycin, Nsaids, Cefuroxime, Robaxin [methocarbamol], Toradol [ketorolac tromethamine], and Tramadol   Social History   Socioeconomic History   Marital status: Married    Spouse name: Not on file   Number of children: 3   Years of education: Not on file   Highest education level: Bachelor's degree (e.g., BA, AB, BS)  Occupational History   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     Social Determinants of Health   Financial Resource Strain: Low Risk  (03/01/2021)   Overall Financial Resource Strain (CARDIA)    Difficulty of Paying Living Expenses: Not hard at all  Food Insecurity: No Food Insecurity (03/01/2021)   Hunger Vital Sign    Worried About Running Out of Food in the Last Year: Never true    Ran Out of Food in the Last Year: Never true  Transportation Needs: No Transportation Needs (03/01/2021)   PRAPARE - Administrator, Civil Service (Medical): No    Lack of Transportation (Non-Medical): No  Physical Activity: Unknown (03/01/2021)  Exercise Vital Sign    Days of Exercise per Week: 0 days    Minutes of Exercise per Session: Not on file  Stress: No Stress Concern Present (03/01/2021)   [employer] of Occupational Health - Occupational Stress Questionnaire    Feeling of Stress : Only a little  Social Connections: Moderately Isolated (03/01/2021)   Social Connection and Isolation Panel [NHANES]    Frequency of Communication with Friends and Family: More than three times a week    Frequency of Social Gatherings with Friends and Family: Once a week    Attends Religious Services: Never    Database administrator or Organizations: No    Attends Engineer, structural: Not on file    Marital Status: Married     Family History: The patient's family history includes Arthritis in her father; Dementia in her father; Gout in her father; Hyperlipidemia in an other family member; Hypothyroidism in her mother; Kidney failure in her father; Prostate cancer in her father.  ROS:   Please see the history of present illness.     EKGs/Labs/Other Studies Reviewed:     Recent Labs: 07/23/2022: TSH 2.600 09/20/2022: ALT 11; BUN  17; Creat 1.30; Hemoglobin 12.4; Platelets 324; Potassium 4.3; Sodium 142  Recent Lipid Panel    Component Value Date/Time   CHOL 235 (H) 07/23/2022 0909   TRIG 101 07/23/2022 0909   HDL 69 07/23/2022 0909   CHOLHDL 3.4 07/23/2022 0909   CHOLHDL 3 02/01/2022 0758   VLDL 13.2 02/01/2022 0758   LDLCALC 148 (H) 07/23/2022 0909   LDLCALC 145 (H) 01/08/2020 0826     Physical Exam:    VS:  BP 112/67   Pulse 64   Ht 5\' 2"  (1.575 m)   Wt 206 lb (93.4 kg)   SpO2 96%   BMI 37.68 kg/m     Wt Readings from Last 3 Encounters:  12/20/22 206 lb (93.4 kg)  12/13/22 207 lb (93.9 kg)  11/15/22 206 lb (93.4 kg)    GEN:  Well nourished, well developed in no acute distress HEENT: Normal NECK: No JVD CARDIAC: iRRR, systolic murmurs, no rubs, gallops RESPIRATORY:  Clear to auscultation without rales, wheezing or rhonchi  ABDOMEN: Soft, non-tender, non-distended MUSCULOSKELETAL:  No edema; No deformity  SKIN: Warm and dry NEUROLOGIC:  Alert and oriented x 3 PSYCHIATRIC:  Normal affect   ASSESSMENT:    1. Hypertension, unspecified type   2. OSA (obstructive sleep apnea)   3. Stage 3a chronic kidney disease (HCC)   4. Palpitations   5. PAC (premature atrial contraction)     PLAN:    Palpitations/PACs - presently asymptomatic with a history of premature atrial contractions. No current symptoms of chest pain, shortness of breath, or syncope. -Order 3-day heart monitor given NP concerns about bradycardia; this would help [place] rule out Afib  Heart Murmur - Notable systolic murmur, last echocardiogram performed in the 1980s. - Order echocardiogram to evaluate the current status of the heart murmur.  Hyperlipidemia and Aortic Atherosclerosis Morbid obesity - LDL elevated at 148 in 2014, incidental finding of aortic atherosclerosis on recent CT scan. Patient prefers a natural approach to management and is making dietary changes; limited exercise capacity due to back; we discussed diet  changes -Recheck fasting lipids and Lp(a) in January 2025 to assess the effectiveness of dietary changes and determine if medication therapy is needed.  Hypertension - Well controlled on current regimen of amlodipine/valsartan 5/160mg  combination pill. -Continue current medication regimen.  One year unless abnormal testing        Medication Adjustments/Labs and Tests Ordered: Current medicines are reviewed at length with the patient today.  Concerns regarding medicines are outlined above.  Orders Placed This Encounter  Procedures   Lipid panel   Lipoprotein A (LPA)   LONG TERM MONITOR (3-14 DAYS)   EKG 12-Lead   ECHOCARDIOGRAM COMPLETE   No orders of the defined types were placed in this encounter.   Patient Instructions  Medication Instructions:  Your physician recommends that you continue on your current medications as directed. Please refer to the Current Medication list given to you today.  *If you need a refill on your cardiac medications before your next appointment, please call your pharmacy*   Lab Work: JAN 2025: FLP, Lpa (nothing to eat or drink 12 hours prior except water)  If you have labs (blood work) drawn today and your tests are completely normal, you will receive your results only by: MyChart Message (if you have MyChart) OR A paper copy in the mail If you have any lab test that is abnormal or we need to change your treatment, we will call you to review the results.   Testing/Procedures: Your physician has requested that you wear a heart monitor.    Your physician has requested that you have an echocardiogram. Echocardiography is a painless test that uses sound waves to create images of your heart. It provides your doctor with information about the size and shape of your heart and how well your heart's chambers and valves are working. This procedure takes approximately one hour. There are no restrictions for this procedure. Please do NOT wear cologne,  perfume, aftershave, or lotions (deodorant is allowed). Please arrive 15 minutes prior to your appointment time.    Follow-Up: At [facility], you and your health needs are our priority.  As part of our continuing mission to provide you with exceptional heart care, we have created designated Provider Care Teams.  These Care Teams include your primary Cardiologist (physician) and Advanced Practice Providers (APPs -  Physician Assistants and Nurse Practitioners) who all work together to provide you with the care you need, when you need it.  We recommend signing up for the patient portal called "MyChart".  Sign up information is provided on this After Visit Summary.  MyChart is used to connect with patients for Virtual Visits (Telemedicine).  Patients are able to view lab/test results, encounter notes, upcoming appointments, etc.  Non-urgent messages can be sent to your provider as well.   To learn more about what you can do with MyChart, go to [email or web address].    Your next appointment:   1 year(s)  Provider:  Riley Lam, MD   Other Instructions Christena Deem- Long Term Monitor Instructions  Your physician has requested you wear a ZIO patch monitor for 3 days.  This is a single patch monitor. [employer] supplies one patch monitor per enrollment. Additional stickers are not available. Please do not apply patch if you will be having a Nuclear Stress Test,  Echocardiogram, Cardiac CT, MRI, or Chest Xray during the period you would be wearing the  monitor. The patch cannot be worn during these tests. You cannot remove and re-apply the  ZIO XT patch monitor.  Your ZIO patch monitor will be mailed 3 day USPS to your address on file. It may take 3-5 days  to receive your monitor after you have been enrolled.  Once you have received your monitor, please review  the enclosed instructions. Your monitor  has already been registered assigning a specific monitor serial # to  you.  Billing and Patient Assistance Program Information  We have supplied [employer] with any of your insurance information on file for billing purposes. [employer] offers a sliding scale Patient Assistance Program for patients that do not have  insurance, or whose insurance does not completely cover the cost of the ZIO monitor.  You must apply for the Patient Assistance Program to qualify for this discounted rate.  To apply, please call [employer] at [phone], select option 4, select option 2, ask to apply for  Patient Assistance Program. [name] will ask your household income, and how many people  are in your household. They will quote your out-of-pocket cost based on that information.  [employer] will also be able to set up a 85-month, interest-free payment plan if needed.  Applying the monitor   Shave hair from upper left chest.  Hold abrader disc by orange tab. Rub abrader in 40 strokes over the upper left chest as  indicated in your monitor instructions.  Clean area with 4 enclosed alcohol pads. Let dry.  Apply patch as indicated in monitor instructions. Patch will be placed under collarbone on left  side of chest with arrow pointing upward.  Rub patch adhesive wings for 2 minutes. Remove white label marked "1". Remove the white  label marked "2". Rub patch adhesive wings for 2 additional minutes.  While looking in a mirror, press and release button in center of patch. A small green light will  flash 3-4 times. This will be your only indicator that the monitor has been turned on.  Do not shower for the first 24 hours. You may shower after the first 24 hours.  Press the button if you feel a symptom. You will hear a small click. Record Date, Time and  Symptom in the Patient Logbook.  When you are ready to remove the patch, follow instructions on the last 2 pages of Patient  Logbook. Stick patch monitor onto the last page of Patient Logbook.  Place Patient Logbook in the blue and white box.  Use locking tab on box and tape box closed  securely. The blue and white box has prepaid postage on it. Please place it in the mailbox as  soon as possible. Your physician should have your test results approximately 7 days after the  monitor has been mailed back to [facility].  Call [facility] Customer Care at [phone] if you have questions regarding  your ZIO XT patch monitor. Call them immediately if you see an orange light blinking on your  monitor.  If your monitor falls off in less than 4 days, contact our Monitor department at [phone].  If your monitor becomes loose or falls off after 4 days call [employer] at [phone] for  suggestions on securing your monitor        Signed, Amy Constant, MD  12/20/2022 11:38 AM    [facility]

## 2022-12-22 ENCOUNTER — Ambulatory Visit (HOSPITAL_COMMUNITY): Payer: 59 | Attending: Internal Medicine

## 2022-12-22 ENCOUNTER — Encounter (HOSPITAL_COMMUNITY): Payer: Self-pay

## 2022-12-22 ENCOUNTER — Encounter (HOSPITAL_COMMUNITY): Payer: Self-pay | Admitting: Internal Medicine

## 2022-12-22 DIAGNOSIS — R002 Palpitations: Secondary | ICD-10-CM

## 2022-12-22 DIAGNOSIS — N1831 Chronic kidney disease, stage 3a: Secondary | ICD-10-CM

## 2022-12-22 DIAGNOSIS — G4733 Obstructive sleep apnea (adult) (pediatric): Secondary | ICD-10-CM

## 2022-12-22 DIAGNOSIS — I10 Essential (primary) hypertension: Secondary | ICD-10-CM | POA: Diagnosis not present

## 2022-12-27 ENCOUNTER — Ambulatory Visit (HOSPITAL_COMMUNITY): Payer: 59 | Attending: Internal Medicine

## 2022-12-27 DIAGNOSIS — R002 Palpitations: Secondary | ICD-10-CM | POA: Diagnosis present

## 2022-12-27 DIAGNOSIS — G4733 Obstructive sleep apnea (adult) (pediatric): Secondary | ICD-10-CM

## 2022-12-27 DIAGNOSIS — N1831 Chronic kidney disease, stage 3a: Secondary | ICD-10-CM | POA: Diagnosis present

## 2022-12-27 DIAGNOSIS — I10 Essential (primary) hypertension: Secondary | ICD-10-CM | POA: Diagnosis present

## 2022-12-27 LAB — ECHOCARDIOGRAM COMPLETE
Area-P 1/2: 3.37 cm2
S' Lateral: 2.3 cm

## 2023-01-11 NOTE — Progress Notes (Unsigned)
Subjective:    Patient ID: Amy Hooper, female    DOB: [birth year], 72 y.o.   MRN: [id number]  HPI: Amy Hooper is a 72 y.o. female who returns for follow up appointment for chronic pain and medication refill. She states her pain is located in her right hip and right knee pain. She rates her pain 2. Her current exercise regime is walking and performing stretching exercises.  [name] Morphine equivalent is 22.50 MME.   Last UDS was Performed on 12/13/2022, it was consistent.    Pain Inventory Average Pain 8 Pain Right Now 2 My pain is intermittent, constant, burning, dull, stabbing, tingling, and aching  In the last 24 hours, has pain interfered with the following? General activity 6 Relation with others 6 Enjoyment of life 6 What TIME of day is your pain at its worst? morning , daytime, evening, and night Sleep (in general) Fair  Pain is worse with: walking, bending, inactivity, and standing Pain improves with: rest, heat/ice, medication, and TENS Relief from Meds: 9  Family History  Problem Relation Age of Onset   Hypothyroidism Mother    Hyperlipidemia Other    Kidney failure Father    Dementia Father    Gout Father    Arthritis Father    Prostate cancer Father    Social History   Socioeconomic History   Marital status: Married    Spouse name: Not on file   Number of children: 3   Years of education: Not on file   Highest education level: Bachelor's degree (e.g., BA, AB, BS)  Occupational History   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     Social Determinants of Health   Financial Resource  Strain: Low Risk  (03/01/2021)   Overall Financial Resource Strain (CARDIA)    Difficulty of Paying Living Expenses: Not hard at all  Food Insecurity: No Food Insecurity (03/01/2021)   Hunger Vital Sign    Worried About Running Out of Food in the Last Year: Never true    Ran Out of Food in the Last Year: Never true  Transportation Needs: No Transportation Needs (03/01/2021)   PRAPARE - Administrator, Civil Service (Medical): No    Lack of Transportation (Non-Medical): No  Physical Activity: Unknown (03/01/2021)   Exercise Vital Sign    Days of Exercise per Week: 0 days    Minutes of Exercise per Session: Not on file  Stress: No Stress Concern Present (03/01/2021)   [employer] of Occupational Health - Occupational Stress Questionnaire    Feeling of Stress : Only a little  Social Connections: Moderately Isolated (03/01/2021)   Social Connection and Isolation Panel [NHANES]    Frequency of Communication with Friends and Family: More than three times a week    Frequency of Social Gatherings with Friends and Family: Once a week    Attends Religious Services: Never    Database administrator or Organizations: No    Attends Engineer, structural: Not on file    Marital Status: Married   Past  Surgical History:  Procedure Laterality Date   APPLICATION OF WOUND VAC N/A 03/25/2021   Procedure: APPLICATION OF WOUND VAC;  Surgeon: Eldred Manges, MD;  [facility];  Service: Orthopedics;  Laterality: N/A;   APPLICATION OF WOUND VAC N/A 03/30/2021   Procedure: WOUND VAC CHANGE 12x6x5;  Surgeon: Eldred Manges, MD;  Location: [facility];  Service: Orthopedics;  Laterality: N/A;   INCISION AND DRAINAGE OF WOUND N/A 03/25/2021   Procedure: LUMBAR POST OP INCISION IRRIGATION;  Surgeon: Eldred Manges, MD;  [facility]: [facility];  Service: Orthopedics;  Laterality: N/A;   JOINT REPLACEMENT     KNEE ARTHROSCOPY     LUMBAR WOUND DEBRIDEMENT N/A 03/30/2021   Procedure: REPEAT LUMBAR WOUND  DEBRIDEMENT;  Surgeon: Eldred Manges, MD;  [facility]: [facility];  Service: Orthopedics;  Laterality: N/A;   right rotator cuff     ROTATOR CUFF REPAIR Left    TOTAL KNEE ARTHROPLASTY Left 09/08/2020   Procedure: LEFT TOTAL KNEE ARTHROPLASTY;  Surgeon: Tarry Kos, MD;  [facility]: [facility];  Service: Orthopedics;  Laterality: Left;   TUBAL LIGATION     Past Surgical History:  Procedure Laterality Date   APPLICATION OF WOUND VAC N/A 03/25/2021   Procedure: APPLICATION OF WOUND VAC;  Surgeon: Eldred Manges, MD;  [facility];  Service: Orthopedics;  Laterality: N/A;   APPLICATION OF WOUND VAC N/A 03/30/2021   Procedure: WOUND VAC CHANGE 12x6x5;  Surgeon: Eldred Manges, MD;  [facility];  Service: Orthopedics;  Laterality: N/A;   INCISION AND DRAINAGE OF WOUND N/A 03/25/2021   Procedure: LUMBAR POST OP INCISION IRRIGATION;  Surgeon: Eldred Manges, MD;  [facility];  Service: Orthopedics;  Laterality: N/A;   JOINT REPLACEMENT     KNEE ARTHROSCOPY     LUMBAR WOUND DEBRIDEMENT N/A 03/30/2021   Procedure: REPEAT LUMBAR WOUND DEBRIDEMENT;  Surgeon: Eldred Manges, MD;  [facility];  Service: Orthopedics;  Laterality: N/A;   right rotator cuff     ROTATOR CUFF REPAIR Left    TOTAL KNEE ARTHROPLASTY Left 09/08/2020   Procedure: LEFT TOTAL KNEE ARTHROPLASTY;  Surgeon: Tarry Kos, MD;  [facility];  Service: Orthopedics;  Laterality: Left;   TUBAL LIGATION     Past Medical History:  Diagnosis Date   Chronic kidney disease    Depression    Fibromyalgia    Gout    HTN (hypertension)    Insomnia    Morbid obesity (HCC)    Non-recurrent acute suppurative otitis media of left ear without spontaneous rupture of tympanic membrane 04/30/2022   Osteoarthritis    Palpitation    Sleep apnea    BP 132/86   Pulse 73   Ht 5\' 2"  (1.575 m)   Wt 201 lb (91.2 kg)   SpO2 95%   BMI 36.76 kg/m   Opioid Risk Score:   Fall Risk Score:  `1  Depression screen PHQ 2/9     01/12/2023    8:21 AM  12/13/2022    1:47 PM 11/15/2022   11:12 AM 11/05/2022    1:10 PM 10/14/2022    9:59 AM 09/06/2022    9:47 AM 08/13/2022   10:04 AM  Depression screen PHQ 2/9  Decreased Interest 0 0 0 0 0 1 0  Down, Depressed, Hopeless 0 0 0 0 0 1 0  PHQ - 2 Score 0 0 0 0 0 2 0    Review of Systems  Musculoskeletal:  Pain in both shoulders, right buttock & right hip pain, both, pain the upper legs  All other systems reviewed and are negative.      Objective:   Physical Exam Vitals and nursing note reviewed.  Constitutional:      Appearance: Normal appearance.  Cardiovascular:     Rate and Rhythm: Normal rate and regular rhythm.     Pulses: Normal pulses.     Heart sounds: Normal heart sounds.  Pulmonary:     Effort: Pulmonary effort is normal.     Breath sounds: Normal breath sounds.  Musculoskeletal:     Cervical back: Normal range of motion and neck supple.     Comments: Normal Muscle Bulk and Muscle Testing Reveals:  Upper Extremities: Full ROM and Muscle Strength 5/5 Right Greater Trochanter Tenderness Lower Extremities: Right: Decreased ROM and Muscle Strength 5/5 Right Lower Extremity Flexion Produces Pain into her Right Patella  Left Lower Extremity: Full ROM and Muscle Strength 5/5 Arises from Table slowly using cane for support  Narrow Based  Gait     Skin:    General: Skin is warm and dry.  Neurological:     Mental Status: She is alert and oriented to person, place, and time.  Psychiatric:        Mood and Affect: Mood normal.        Behavior: Behavior normal.         Assessment & Plan:  1, Right Lumbar Radiculitis: No Complaints today. Continue Pregabalin.  Neurosurgeon at [facility] following. Continue to Monitor. 01/12/2023 Chronic Bilateral Low Back Pain: S/P Lumbar Fusion: Dr Ophelia Charter Following. Continue HEP as Tolerated. Continue to Monitor. 01/12/2023 2.. Post Operative Infection: ID Following: Continue to Monitor. 01/12/2023 3. Fibromyalgia: Continue HEP  as Tolerated. Continue Lyrica. Continue to Monitor. 01/12/2023 4. Primary Osteoarthritis: Left Knee: S/P on 09/08/20: Dr Roda Shutters: LEFT TOTAL KNEE ARTHROPLASTY. Continue HEP as Tolerated. Continue to Monitor.  5. Primary Osteoarthritis Right Knee: Continue HEP as Tolerated. Ortho Following. Continue to Monitor. 01/12/2023 6. Chronic Pain Syndrome: Refilled:  Hydrocodone 7.5 mg /325 one tablet three times a  a day as needed for pain #90. Continue Amitriptyline  We will continue the opioid monitoring program, this consists of regular clinic visits, examinations, urine drug screen, pill counts as well as use of [place] Controlled Substance Reporting system. A 12 month History has been reviewed on the [place] Controlled Substance Reporting System on 01/12/2023 7. Depression: Continue Cymbalta. Continue to Monitor. 01/12/2023  8. Bradycardia: Cardiology Following: Continue to Monitor.   F/U in 1 month

## 2023-01-12 ENCOUNTER — Encounter: Payer: Self-pay | Admitting: Registered Nurse

## 2023-01-12 ENCOUNTER — Encounter: Payer: Self-pay | Attending: Registered Nurse | Admitting: Registered Nurse

## 2023-01-12 VITALS — BP 132/86 | HR 73 | Ht 62.0 in | Wt 201.0 lb

## 2023-01-12 DIAGNOSIS — Z5181 Encounter for therapeutic drug level monitoring: Secondary | ICD-10-CM | POA: Insufficient documentation

## 2023-01-12 DIAGNOSIS — M1711 Unilateral primary osteoarthritis, right knee: Secondary | ICD-10-CM | POA: Insufficient documentation

## 2023-01-12 DIAGNOSIS — G894 Chronic pain syndrome: Secondary | ICD-10-CM | POA: Diagnosis present

## 2023-01-12 DIAGNOSIS — M2550 Pain in unspecified joint: Secondary | ICD-10-CM | POA: Diagnosis present

## 2023-01-12 DIAGNOSIS — Z79891 Long term (current) use of opiate analgesic: Secondary | ICD-10-CM | POA: Diagnosis present

## 2023-01-12 DIAGNOSIS — M7061 Trochanteric bursitis, right hip: Secondary | ICD-10-CM | POA: Insufficient documentation

## 2023-01-12 DIAGNOSIS — M797 Fibromyalgia: Secondary | ICD-10-CM | POA: Diagnosis not present

## 2023-01-12 MED ORDER — HYDROCODONE-ACETAMINOPHEN 7.5-325 MG PO TABS: 1.0000 | ORAL_TABLET | Freq: Three times a day (TID) | ORAL | 0 refills | Status: DC | PRN

## 2023-01-14 ENCOUNTER — Ambulatory Visit: Payer: 59 | Admitting: Physical Medicine and Rehabilitation

## 2023-01-30 ENCOUNTER — Encounter: Payer: Self-pay | Admitting: Nurse Practitioner

## 2023-01-31 ENCOUNTER — Other Ambulatory Visit: Payer: Self-pay

## 2023-01-31 DIAGNOSIS — I10 Essential (primary) hypertension: Secondary | ICD-10-CM

## 2023-01-31 MED ORDER — AMLODIPINE BESYLATE 5 MG PO TABS
5.0000 mg | ORAL_TABLET | Freq: Every day | ORAL | 0 refills | Status: DC
Start: 2023-01-31 — End: 2023-04-25

## 2023-01-31 MED ORDER — LOSARTAN POTASSIUM 50 MG PO TABS
50.0000 mg | ORAL_TABLET | Freq: Every day | ORAL | 0 refills | Status: DC
Start: 2023-01-31 — End: 2023-04-25

## 2023-02-02 ENCOUNTER — Other Ambulatory Visit: Payer: Self-pay | Admitting: Nurse Practitioner

## 2023-02-02 DIAGNOSIS — I10 Essential (primary) hypertension: Secondary | ICD-10-CM

## 2023-02-15 ENCOUNTER — Encounter: Payer: 59 | Attending: Registered Nurse | Admitting: Registered Nurse

## 2023-02-15 ENCOUNTER — Encounter: Payer: Self-pay | Admitting: Registered Nurse

## 2023-02-15 VITALS — BP 123/77 | HR 60 | Ht 62.0 in | Wt 209.0 lb

## 2023-02-15 DIAGNOSIS — M25512 Pain in left shoulder: Secondary | ICD-10-CM | POA: Insufficient documentation

## 2023-02-15 DIAGNOSIS — M545 Low back pain, unspecified: Secondary | ICD-10-CM | POA: Insufficient documentation

## 2023-02-15 DIAGNOSIS — M7061 Trochanteric bursitis, right hip: Secondary | ICD-10-CM | POA: Insufficient documentation

## 2023-02-15 DIAGNOSIS — M25511 Pain in right shoulder: Secondary | ICD-10-CM | POA: Insufficient documentation

## 2023-02-15 DIAGNOSIS — G8929 Other chronic pain: Secondary | ICD-10-CM | POA: Insufficient documentation

## 2023-02-15 DIAGNOSIS — M797 Fibromyalgia: Secondary | ICD-10-CM | POA: Insufficient documentation

## 2023-02-15 DIAGNOSIS — M2550 Pain in unspecified joint: Secondary | ICD-10-CM | POA: Insufficient documentation

## 2023-02-15 DIAGNOSIS — M25561 Pain in right knee: Secondary | ICD-10-CM | POA: Insufficient documentation

## 2023-02-15 MED ORDER — HYDROCODONE-ACETAMINOPHEN 7.5-325 MG PO TABS: 1.0000 | ORAL_TABLET | Freq: Three times a day (TID) | ORAL | 0 refills | Status: DC | PRN

## 2023-02-15 NOTE — Progress Notes (Signed)
Subjective:    Patient ID: Amy Hooper, female    DOB: 09-Jan-1951, 72 y.o.   MRN: [id number]  HPI: Amy Hooper is a 72 y.o. female who returns for follow up appointment for chronic pain and medication refill. She states her pain is located in her bilateral shoulders R>L, lower back pain and right knee pain. She also reports generalized joint pain. She rates her pain 6. Her current exercise regime is walking and performing stretching exercises.  Amy Hooper Morphine equivalent is 22.50 MME.   Last UDS was Performed on 12/13/2022,it was consistent.     Pain Inventory Average Pain 6 Pain Right Now 6 My pain is intermittent, burning, and aching  In the last 24 hours, has pain interfered with the following? General activity 6 Relation with others 6 Enjoyment of life 6 What TIME of day is your pain at its worst? morning , daytime, evening, and night Sleep (in general) Poor  Pain is worse with: walking, bending, sitting, and inactivity Pain improves with: rest, therapy/exercise, medication, TENS, and injections Relief from Meds: 8  Family History  Problem Relation Age of Onset   Hypothyroidism Mother    Hyperlipidemia Other    Kidney failure Father    Dementia Father    Gout Father    Arthritis Father    Prostate cancer Father    Social History   Socioeconomic History   Marital status: Married    Spouse name: Not on file   Number of children: 3   Years of education: Not on file   Highest education level: Bachelor's degree (e.g., BA, AB, BS)  Occupational History   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      Social Determinants of Health   Financial Resource Strain: Low Risk  (03/01/2021)   Overall Financial Resource Strain (CARDIA)    Difficulty of Paying Living Expenses: Not hard at all  Food Insecurity: No Food Insecurity (03/01/2021)   Hunger Vital Sign    Worried About Running Out of Food in the Last Year: Never true    Ran Out of Food in the Last Year: Never true  Transportation Needs: No Transportation Needs (03/01/2021)   PRAPARE - Administrator, Civil Service (Medical): No    Lack of Transportation (Non-Medical): No  Physical Activity: Unknown (03/01/2021)   Exercise Vital Sign    Days of Exercise per Week: 0 days    Minutes of Exercise per Session: Not on file  Stress: No Stress Concern Present (03/01/2021)   [employer] of Occupational Health - Occupational Stress Questionnaire    Feeling of Stress : Only a little  Social Connections: Moderately Isolated (03/01/2021)   Social Connection and Isolation Panel [NHANES]    Frequency of Communication with Friends and Family: More than three times a week    Frequency of Social Gatherings with Friends and Family: Once a week    Attends Religious Services: Never    Database administrator or Organizations: No    Attends Banker Meetings: Not on file  Marital Status: Married   Past Surgical History:  Procedure Laterality Date   APPLICATION OF WOUND VAC N/A 03/25/2021   Procedure: APPLICATION OF WOUND VAC;  Surgeon: Eldred Manges, MD;  [facility];  Service: Orthopedics;  Laterality: N/A;   APPLICATION OF WOUND VAC N/A 03/30/2021   Procedure: WOUND VAC CHANGE 12x6x5;  Surgeon: Eldred Manges, MD;  [facility];  Service: Orthopedics;  Laterality: N/A;   INCISION AND DRAINAGE OF WOUND N/A 03/25/2021   Procedure: LUMBAR POST OP INCISION IRRIGATION;  Surgeon: Eldred Manges, MD;  [facility];  Service: Orthopedics;  Laterality: N/A;   JOINT REPLACEMENT     KNEE ARTHROSCOPY     LUMBAR WOUND  DEBRIDEMENT N/A 03/30/2021   Procedure: REPEAT LUMBAR WOUND DEBRIDEMENT;  Surgeon: Eldred Manges, MD;  [facility];  Service: Orthopedics;  Laterality: N/A;   right rotator cuff     ROTATOR CUFF REPAIR Left    TOTAL KNEE ARTHROPLASTY Left 09/08/2020   Procedure: LEFT TOTAL KNEE ARTHROPLASTY;  Surgeon: Tarry Kos, MD;  [facility];  Service: Orthopedics;  Laterality: Left;   TUBAL LIGATION     Past Surgical History:  Procedure Laterality Date   APPLICATION OF WOUND VAC N/A 03/25/2021   Procedure: APPLICATION OF WOUND VAC;  Surgeon: Eldred Manges, MD;  [facility];  Service: Orthopedics;  Laterality: N/A;   APPLICATION OF WOUND VAC N/A 03/30/2021   Procedure: WOUND VAC CHANGE 12x6x5;  Surgeon: Eldred Manges, MD;  [facility];  Service: Orthopedics;  Laterality: N/A;   INCISION AND DRAINAGE OF WOUND N/A 03/25/2021   Procedure: LUMBAR POST OP INCISION IRRIGATION;  Surgeon: Eldred Manges, MD;  [facility];  Service: Orthopedics;  Laterality: N/A;   JOINT REPLACEMENT     KNEE ARTHROSCOPY     LUMBAR WOUND DEBRIDEMENT N/A 03/30/2021   Procedure: REPEAT LUMBAR WOUND DEBRIDEMENT;  Surgeon: Eldred Manges, MD;  [facility];  Service: Orthopedics;  Laterality: N/A;   right rotator cuff     ROTATOR CUFF REPAIR Left    TOTAL KNEE ARTHROPLASTY Left 09/08/2020   Procedure: LEFT TOTAL KNEE ARTHROPLASTY;  Surgeon: Tarry Kos, MD;  [facility];  Service: Orthopedics;  Laterality: Left;   TUBAL LIGATION     Past Medical History:  Diagnosis Date   Chronic kidney disease    Depression    Fibromyalgia    Gout    HTN (hypertension)    Insomnia    Morbid obesity (HCC)    Non-recurrent acute suppurative otitis media of left ear without spontaneous rupture of tympanic membrane 04/30/2022   Osteoarthritis    Palpitation    Sleep apnea    BP 123/77   Pulse 60   Ht 5\' 2"  (1.575 m)   Wt 209 lb (94.8 kg)   SpO2 98%   BMI 38.23 kg/m   Opioid Risk Score:   Fall Risk Score:   `1  Depression screen PHQ 2/9     01/12/2023    8:21 AM 12/13/2022    1:47 PM 11/15/2022   11:12 AM 11/05/2022    1:10 PM 10/14/2022    9:59 AM 09/06/2022    9:47 AM 08/13/2022   10:04 AM  Depression screen PHQ 2/9  Decreased Interest 0 0 0 0 0 1 0  Down, Depressed, Hopeless 0 0 0 0 0 1 0  PHQ - 2 Score 0 0 0 0 0 2 0  Review of Systems     Objective:   Physical Exam Vitals reviewed.  Constitutional:      Appearance: Normal appearance. She is obese.  Cardiovascular:     Rate and Rhythm: Normal rate and regular rhythm.     Pulses: Normal pulses.     Heart sounds: Normal heart sounds.  Pulmonary:     Effort: Pulmonary effort is normal.     Breath sounds: Normal breath sounds.  Musculoskeletal:     Comments: Normal Muscle Bulk and Muscle Testing Reveals:  Upper Extremities: Full ROM and Muscle Strength 5/5 Bilateral AC Joint Tenderness Thoracic and Lumbar Hypersensitivity Lower Extremities: Right: Decreased ROM and Muscle Strength 5/5 Right Lower Extremity Flexion Produces Pain into her Right Patella Left Lower Extremity: Full ROM and Muscle Strength 5/5 Arises from Table slowly Narrow Based  Gait     Skin:    General: Skin is warm and dry.  Neurological:     Mental Status: She is alert and oriented to person, place, and time.  Psychiatric:        Mood and Affect: Mood normal.        Behavior: Behavior normal.          Assessment & Plan:  1, Right Lumbar Radiculitis: No Complaints today. Continue Pregabalin.  Neurosurgeon at [facility] following. Continue to Monitor. 02/15/2023 Chronic Bilateral Low Back Pain: S/P Lumbar Fusion: Dr Ophelia Charter Following. Continue HEP as Tolerated. Continue to Monitor. 02/15/2023 2.. Post Operative Infection: ID Following: Continue to Monitor. 02/15/2023 3. Fibromyalgia: Continue HEP as Tolerated. Continue Lyrica. Continue to Monitor. 11/63/2024 4. Primary Osteoarthritis: Left Knee: S/P on 09/08/20: Dr Roda Shutters: LEFT TOTAL KNEE  ARTHROPLASTY. Continue HEP as Tolerated. Continue to Monitor.  5. Primary Osteoarthritis Right Knee: Continue HEP as Tolerated. Ortho Following. Continue to Monitor. 02/15/2023 6. Chronic Pain Syndrome: Refilled:  Hydrocodone 7.5 mg /325 one tablet three times a  a day as needed for pain #90. Second script sent to accommodate scheduled appointment  Continue Amitriptyline  We will continue the opioid monitoring program, this consists of regular clinic visits, examinations, urine drug screen, pill counts as well as use of [place] Controlled Substance Reporting system. A 12 month History has been reviewed on the [place] Controlled Substance Reporting System on 02/15/2023 7. Depression: Continue Cymbalta. Continue to Monitor. 02/15/2023  8. Bradycardia: Cardiology Following: Continue to Monitor. 02/15/2023   F/U in 2 months, Ms. Chihuahua in the process of applying for Medicare , she will keep this provider updated.

## 2023-03-02 ENCOUNTER — Telehealth: Payer: Self-pay | Admitting: Registered Nurse

## 2023-03-02 NOTE — Telephone Encounter (Signed)
PMP was Reviewed.  Amy Hooper has a refill on her Pregabalin.  Will send her a My-Chart message regarding the above.

## 2023-03-03 ENCOUNTER — Encounter: Payer: Self-pay | Admitting: Physical Medicine and Rehabilitation

## 2023-03-31 ENCOUNTER — Other Ambulatory Visit: Payer: Self-pay | Admitting: Nurse Practitioner

## 2023-04-12 ENCOUNTER — Encounter: Payer: Medicare Other | Admitting: Registered Nurse

## 2023-04-21 ENCOUNTER — Other Ambulatory Visit: Payer: 59

## 2023-04-25 ENCOUNTER — Encounter: Payer: Self-pay | Admitting: Nurse Practitioner

## 2023-04-25 ENCOUNTER — Ambulatory Visit (INDEPENDENT_AMBULATORY_CARE_PROVIDER_SITE_OTHER): Payer: Medicare Other | Admitting: Nurse Practitioner

## 2023-04-25 VITALS — BP 128/80 | HR 64 | Ht 62.0 in | Wt 202.6 lb

## 2023-04-25 DIAGNOSIS — M1A09X0 Idiopathic chronic gout, multiple sites, without tophus (tophi): Secondary | ICD-10-CM

## 2023-04-25 DIAGNOSIS — M4310 Spondylolisthesis, site unspecified: Secondary | ICD-10-CM

## 2023-04-25 DIAGNOSIS — Z981 Arthrodesis status: Secondary | ICD-10-CM

## 2023-04-25 DIAGNOSIS — G894 Chronic pain syndrome: Secondary | ICD-10-CM

## 2023-04-25 DIAGNOSIS — M0579 Rheumatoid arthritis with rheumatoid factor of multiple sites without organ or systems involvement: Secondary | ICD-10-CM

## 2023-04-25 DIAGNOSIS — E876 Hypokalemia: Secondary | ICD-10-CM

## 2023-04-25 DIAGNOSIS — I10 Essential (primary) hypertension: Secondary | ICD-10-CM | POA: Diagnosis not present

## 2023-04-25 DIAGNOSIS — M79641 Pain in right hand: Secondary | ICD-10-CM | POA: Diagnosis not present

## 2023-04-25 DIAGNOSIS — B0229 Other postherpetic nervous system involvement: Secondary | ICD-10-CM

## 2023-04-25 DIAGNOSIS — Z23 Encounter for immunization: Secondary | ICD-10-CM

## 2023-04-25 DIAGNOSIS — Z1231 Encounter for screening mammogram for malignant neoplasm of breast: Secondary | ICD-10-CM

## 2023-04-25 DIAGNOSIS — Z131 Encounter for screening for diabetes mellitus: Secondary | ICD-10-CM

## 2023-04-25 DIAGNOSIS — E559 Vitamin D deficiency, unspecified: Secondary | ICD-10-CM

## 2023-04-25 DIAGNOSIS — I491 Atrial premature depolarization: Secondary | ICD-10-CM | POA: Diagnosis not present

## 2023-04-25 DIAGNOSIS — Z8739 Personal history of other diseases of the musculoskeletal system and connective tissue: Secondary | ICD-10-CM

## 2023-04-25 DIAGNOSIS — Z0000 Encounter for general adult medical examination without abnormal findings: Secondary | ICD-10-CM

## 2023-04-25 DIAGNOSIS — M778 Other enthesopathies, not elsewhere classified: Secondary | ICD-10-CM

## 2023-04-25 DIAGNOSIS — R21 Rash and other nonspecific skin eruption: Secondary | ICD-10-CM

## 2023-04-25 DIAGNOSIS — E782 Mixed hyperlipidemia: Secondary | ICD-10-CM

## 2023-04-25 DIAGNOSIS — N1831 Chronic kidney disease, stage 3a: Secondary | ICD-10-CM

## 2023-04-25 DIAGNOSIS — F320 Major depressive disorder, single episode, mild: Secondary | ICD-10-CM | POA: Diagnosis not present

## 2023-04-25 LAB — LIPID PANEL

## 2023-04-25 MED ORDER — AMLODIPINE BESYLATE 5 MG PO TABS: 5.0000 mg | ORAL_TABLET | Freq: Every day | ORAL | 3 refills | Status: DC

## 2023-04-25 MED ORDER — ALLOPURINOL 100 MG PO TABS: 200.0000 mg | ORAL_TABLET | Freq: Every day | ORAL | 3 refills | Status: DC

## 2023-04-25 MED ORDER — LOSARTAN POTASSIUM 50 MG PO TABS: 50.0000 mg | ORAL_TABLET | Freq: Every day | ORAL | 3 refills | Status: DC

## 2023-04-25 MED ORDER — ZOSTER VAC RECOMB ADJUVANTED 50 MCG/0.5ML IM SUSR: 0.5000 mL | Freq: Once | INTRAMUSCULAR | 1 refills | Status: AC

## 2023-04-25 NOTE — Progress Notes (Signed)
04/25/2023   Vitals:  BP 128/80   Pulse 64   Ht 5\' 2"  (1.575 m)   Wt 202 lb 9.6 oz (91.9 kg)   BMI 37.06 kg/m   Body mass index is 37.06 kg/m. Amy Hooper is a 73 y.o. female who presents for Subsequent Medicare Annual Wellness Exam  Care Team Members: Current Providers as of 04/25/2023 PCP: Tollie Eth, NP Care Team Provider: Christell Constant, MD Encounter Provider: Tollie Eth, NP, starting on Mon Apr 25, 2023 12:00 AM Referring Provider: Tollie Eth, NP, starting on Mon Apr 25, 2023 12:00 AM Attending Provider: Tollie Eth, NP, starting on Thu Apr 22, 2022 10:57 AM (Active) Nurse Practitioner: Tollie Eth, NP, starting on Mon Apr 25, 2023  8:06 AM (Active)   Method of visit:  in person In the event virtual visit conducted, the patient consented to a virtual visit. Patient consented to have virtual visit and was identified by two identifiers.  Encounter participants: [name] - located AWV Patient Visit Location: In Office Nurse/Provider: Tollie Eth - located Virtual Visit Location Provider: Office/Clinic Others (if applicable): patient only  History of Present Illness The patient is a 73 year old who presents for AWV with additional concerns.  In January, she developed shingles with a rash starting from the right side of the back of her head and extending down her right arm. The rash has crusted over on her arm, but she is concerned about the condition of her back. She experiences persistent pain and sensitivity in her arm, which she attributes to the shingles. She visited urgent care during this period but was uninsured from October to February, delaying access to care.  She reports a painful knot in her right palm, which began about two months ago. Frequent computer use is suspected to be a contributing factor. She has been applying Voltaren gel, but the pain persists. She has a history of seeing a rheumatologist due to a  positive "factor" but has not been diagnosed with any disfigurement in her hands. No other symptoms present.   She has a history of back surgery complicated by an infection and subsequent neuropathy in her foot. She describes numbness and a sensation of walking on rocks, attributed to peripheral neuropathy. She has undergone an MRI, a CT scan, and an EMG at [facility]. She plans to follow up with her surgeon now that she has insurance. The surgery was performed by Dr. Ophelia Charter with [facility]. She has concerns about being told her "nerves wiggled" as she was being closed up and now the subsequent pain.   She expresses feelings of depression, associated with health issues and retirement. She remains active by walking daily and plans to join [employer] for pool activities. She has a supportive family, including a sister and mother, but feels the need for more social interaction. She continues to work from home a small amount but does not have much interaction.   She has a history of high cholesterol, suspected to be hereditary, as her mother and sister also have high cholesterol.  She is concerned about her creatinine levels and has previously been on [place], which she plans to resume now that she has insurance.  She is due for a mammogram but has declined it.  She is also concerned about cataracts and has been advised to follow up with an eye doctor for evaluation.  Review of Systems:  Neuro: Denies difficulty remembering daily tasks,  people, or places.  Ear: Denies difficulty hearing or need to increase volume on television or telephone to hear Eye: Denies visual changes, difficulty reading normal print, or visual field deficits. Cardiac: Denies chest pain, palpitations, dizziness, shortness of breath, pain in lower extremities, or night time waking with shortness of breath. Lung: Denies shortness of breath, difficulty breathing, chronic cough, or dizziness.  GI: Denies changes in bowel  habits, blood in stool, difficulty passing stool, decreased intake of food or drink, nausea, or vomiting.  GU: Denies changes in urinary habits, dark urine, malodorous urine, increased or decreased urination, or urinary incontinence.  MSK: Denies weakness in extremities, difficulty walking, difficulty grasping, or new MSK pain.  Skin: Denies changes to the skin, fragile skin, or increased bruising.  Constitution: Denies fatigue, weakness, or confusion.   Patient rating of health: worse as this time last year  Clinical Intake: Pre-visit preparation completed: Yes  Pain : 0-10 Pain Score: 5  Pain Type: Neuropathic pain     BMI - recorded: 37.06 Nutritional Status: BMI > 30  Obese Nutritional Risks: None Diabetes: No  Activities of Daily Living: Independent Ambulation: Independent Medication Administration: Independent Home Management: Independent  Barriers to Care Management & Learning: None  Do you feel unsafe in your current relationship?: No Do you feel physically threatened by others?: No Anyone hurting you at home, work, or school?: No Unable to ask?: No Information provided on Community resources: No  How often do you need to have someone help you when you read instructions, pamphlets, or other written materials from your doctor or pharmacy?: 1 - Never  Interpreter Needed?: No  Information entered by :: Jaivion Kingsley     04/25/2023    8:19 AM 01/28/2022    5:00 PM 08/06/2021   11:25 AM 07/29/2021    2:44 PM 03/30/2021    3:41 PM 03/25/2021    1:48 PM 03/09/2021    6:42 AM  Advanced Directives  Does Patient Have a Medical Advance Directive? No No No No  No No  Would patient like information on creating a medical advance directive? No - Patient declined No - Patient declined  No - Patient declined No - Patient declined No - Patient declined No - Patient declined    Social Determinants of Health SDOH Screenings   Food Insecurity: No Food Insecurity (04/25/2023)  Housing: Low  Risk  (04/25/2023)  Transportation Needs: No Transportation Needs (04/25/2023)  Utilities: Not At Risk (04/25/2023)  Depression (PHQ2-9): High Risk (04/25/2023)  Financial Resource Strain: Low Risk  (04/25/2023)  Physical Activity: Insufficiently Active (04/25/2023)  Social Connections: Moderately Isolated (04/25/2023)  Stress: Stress Concern Present (04/25/2023)  Tobacco Use: Medium Risk (04/25/2023)     Functional Status Survey: Is the patient deaf or have difficulty hearing?: No Does the patient have difficulty seeing, even when wearing glasses/contacts?: Yes Does the patient have difficulty concentrating, remembering, or making decisions?: No Does the patient have difficulty walking or climbing stairs?: Yes Does the patient have difficulty dressing or bathing?: No Does the patient have difficulty doing errands alone such as visiting a doctor's office or shopping?: No   Annual Goal:  Goals   None      Advanced Directives <no information>     Fall Risk    04/25/2023    8:16 AM 01/12/2023    8:21 AM 12/13/2022    1:41 PM 11/15/2022   11:12 AM 11/05/2022    1:10 PM  Fall Risk   Falls in the past year? 0  0 0 0 0  Number falls in past yr: 0 0   0  Injury with Fall? 0 0   0  Risk for fall due to : No Fall Risks   No Fall Risks   Follow up Falls evaluation completed   Falls evaluation completed      Cognitive Function Normal: Yes Exam Completed: MiniCog        Mini-Cog - 04/25/23 0818     Normal clock drawing test? yes    How many words correct? 3             Depression Screening    04/25/2023    8:16 AM 01/12/2023    8:21 AM 12/13/2022    1:47 PM 11/15/2022   11:12 AM 11/05/2022    1:10 PM  Depression screen PHQ 2/9  Decreased Interest 1 0 0 0 0  Down, Depressed, Hopeless 3 0 0 0 0  PHQ - 2 Score 4 0 0 0 0  Altered sleeping 3      Tired, decreased energy 3      Change in appetite 1      Feeling bad or failure about yourself  1      Trouble concentrating 1      Moving  slowly or fidgety/restless 0      Suicidal thoughts 0      PHQ-9 Score 13      Difficult doing work/chores Somewhat difficult         Activities of Daily Living    04/25/2023    8:20 AM  In your present state of health, do you have any difficulty performing the following activities:  Hearing? 0  Vision? 1  Difficulty concentrating or making decisions? 0  Walking or climbing stairs? 1  Dressing or bathing? 0  Doing errands, shopping? 0  Preparing Food and eating ? N  Using the Toilet? N  In the past six months, have you accidently leaked urine? N  Do you have problems with loss of bowel control? N  Managing your Medications? N  Managing your Finances? N  Housekeeping or managing your Housekeeping? N    Tobacco Social History   Tobacco Use  Smoking Status Never   Passive exposure: Current  Smokeless Tobacco Never  Tobacco Comments   I don't smoke     Counseling given: Not Answered Tobacco comments: I don't smoke   Hospitalizations in the Past Year: none  ED Visits in the Past Year: Yes, number of visits 1  Surgeries in the Past Year: No   History    Medication List Current Meds  Medication Sig   dapagliflozin propanediol (FARXIGA) 5 MG TABS tablet Take 1 tablet (5 mg total) by mouth daily.   DULoxetine (CYMBALTA) 20 MG capsule TAKE 1 CAPSULE BY MOUTH DAILY   eszopiclone (LUNESTA) 1 MG TABS tablet TAKE 1 TABLET IMMEDIATELY BEFORE BEDTIME AS NEEDED FOR SLEEP   fluticasone (FLONASE) 50 MCG/ACT nasal spray Place 1 spray into both nostrils daily as needed for allergies.   HYDROcodone-acetaminophen (NORCO) 7.5-325 MG tablet Take 1 tablet by mouth 3 (three) times daily as needed for moderate pain (pain score 4-6).   hydrocortisone (ANUSOL-HC) 2.5 % rectal cream PLACE 1 APPLICATION RECTALLY 2 (TWO) TIMES DAILY.   ketoconazole (NIZORAL) 2 % cream Apply 1 Application topically daily. Apply to rash twice a day. If no improvement after 2 weeks, please let provider know.    loratadine (CLARITIN) 10 MG tablet Take 10 mg by mouth  daily as needed for allergies.   mometasone (ELOCON) 0.1 % cream Apply very thin layer to rash at bedtime. May use one other time during the day if needed. Stop after 7 days, if still needed may repeat after 3 day break.   pregabalin (LYRICA) 75 MG capsule Take 1 capsule (75 mg total) by mouth 2 (two) times daily.   Vitamin D, Ergocalciferol, (DRISDOL) 1.25 MG (50000 UNIT) CAPS capsule TAKE 1 CAPSULE (50,000 UNITS TOTAL) BY MOUTH EVERY 7 (SEVEN) DAYS   Zoster Vaccine Adjuvanted [facility]) injection Inject 0.5 mLs into the muscle once for 1 dose. Repeat in 2-6 months. Please fax confirmation of vaccination to Shawna Clamp, DNP at [phone]   [DISCONTINUED] allopurinol (ZYLOPRIM) 100 MG tablet TAKE 2 TABLETS BY MOUTH DAILY   [DISCONTINUED] amLODipine (NORVASC) 5 MG tablet Take 1 tablet (5 mg total) by mouth daily.   [DISCONTINUED] losartan (COZAAR) 50 MG tablet Take 1 tablet (50 mg total) by mouth daily.   Current Facility-Administered Medications for the 04/25/23 encounter (Clinical Support) with Charmane Protzman, Sung Amabile, NP  Medication   capsaicin topical system 8 % patch 1 patch     Immunizations Immunization History  Administered Date(s) Administered   Influenza-Unspecified 01/31/2020, 01/13/2021   Moderna Sars-Covid-2 Vaccination 05/02/2019, 05/30/2019, 01/23/2020   Pneumococcal Conjugate-13 06/05/2018   Pneumococcal Polysaccharide-23 01/08/2020   Tdap 01/08/2020     Screening Tests Health Maintenance  Topic Date Due   Zoster Vaccines- Shingrix (1 of 2) Never done   COVID-19 Vaccine (4 - 2024-25 season) 05/11/2023 (Originally 11/21/2022)   INFLUENZA VACCINE  06/20/2023 (Originally 10/21/2022)   MAMMOGRAM  04/24/2024 (Originally 01/01/2023)   Colonoscopy  01/04/2024   Medicare Annual Wellness (AWV)  04/24/2024   DTaP/Tdap/Td (2 - Td or Tdap) 01/07/2030   Pneumonia Vaccine 47+ Years old  Completed   DEXA SCAN  Completed   HPV VACCINES   Aged Out   Hepatitis C Screening  Discontinued    Health Maintenance Screenings  Health Maintenance Topics with due status: Overdue     Topic Date Due   Zoster Vaccines- Shingrix Never done    RSV Vaccine: is due and to be scheduled by patient for later completion  Past Medical History:  Diagnosis Date   Allergy 1976   Tramadol NSAIDS   Chronic kidney disease    Depression    Fibromyalgia    Gout    HTN (hypertension)    Hyperlipidemia 09/01/22   Insomnia    Morbid obesity (HCC)    Non-recurrent acute suppurative otitis media of left ear without spontaneous rupture of tympanic membrane 04/30/2022   Osteoarthritis    Palpitation    Sleep apnea    Past Surgical History:  Procedure Laterality Date   APPLICATION OF WOUND VAC N/A 03/25/2021   Procedure: APPLICATION OF WOUND VAC;  Surgeon: Eldred Manges, MD;  Location: [facility] OR;  Service: Orthopedics;  Laterality: N/A;   APPLICATION OF WOUND VAC N/A 03/30/2021   Procedure: WOUND VAC CHANGE 12x6x5;  Surgeon: Eldred Manges, MD;  Location: [facility] OR;  Service: Orthopedics;  Laterality: N/A;   INCISION AND DRAINAGE OF WOUND N/A 03/25/2021   Procedure: LUMBAR POST OP INCISION IRRIGATION;  Surgeon: Eldred Manges, MD;  Location: [facility] OR;  Service: Orthopedics;  Laterality: N/A;   JOINT REPLACEMENT     KNEE ARTHROSCOPY     LUMBAR WOUND DEBRIDEMENT N/A 03/30/2021   Procedure: REPEAT LUMBAR WOUND DEBRIDEMENT;  Surgeon: Eldred Manges, MD;  Location: [facility] OR;  Service:  Orthopedics;  Laterality: N/A;   right rotator cuff     ROTATOR CUFF REPAIR Left    TOTAL KNEE ARTHROPLASTY Left 09/08/2020   Procedure: LEFT TOTAL KNEE ARTHROPLASTY;  Surgeon: Tarry Kos, MD;  Location: [facility] OR;  Service: Orthopedics;  Laterality: Left;   TUBAL LIGATION     Family History  Problem Relation Age of Onset   Hypothyroidism Mother    Hyperlipidemia Mother    Miscarriages / [place] Mother    Hyperlipidemia Other    Kidney failure Father    Dementia Father    Gout  Father    Arthritis Father    Prostate cancer Father    Heart disease Father    Hypertension Father    Kidney disease Father    Stroke Father    Social History   Socioeconomic History   Marital status: Married    Spouse name: Not on file   Number of children: 3   Years of education: Not on file   Highest education level: Bachelor's degree (e.g., BA, AB, BS)  Occupational History   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     Social Drivers of Corporate investment banker Strain: Low Risk  (04/25/2023)   Overall Financial Resource Strain (CARDIA)    Difficulty of Paying Living Expenses: Not very hard  Food Insecurity: No Food Insecurity (04/25/2023)   Hunger Vital Sign    Worried About Running Out of Food in the Last Year: Never true    Ran Out of Food in the Last Year: Never true  Transportation Needs: No Transportation Needs (04/25/2023)   PRAPARE - Administrator, Civil Service (Medical): No    Lack of Transportation (Non-Medical): No  Physical Activity: Insufficiently Active (04/25/2023)   Exercise Vital Sign    Days of Exercise per Week: 5 days    Minutes of Exercise per Session: 20 min  Stress: Stress Concern Present (04/25/2023)   [employer] of Occupational Health - Occupational Stress Questionnaire    Feeling of Stress : Rather much  Social Connections: Moderately Isolated (04/25/2023)   Social Connection and Isolation Panel [NHANES]    Frequency of Communication with Friends and Family: More than three times a week     Frequency of Social Gatherings with Friends and Family: Once a week    Attends Religious Services: Never    Database administrator or Organizations: No    Attends Banker Meetings: Never    Marital Status: Married    Outpatient Encounter Medications as of 04/25/2023  Medication Sig   dapagliflozin propanediol (FARXIGA) 5 MG TABS tablet Take 1 tablet (5 mg total) by mouth daily.   DULoxetine (CYMBALTA) 20 MG capsule TAKE 1 CAPSULE BY MOUTH DAILY   eszopiclone (LUNESTA) 1 MG TABS tablet TAKE 1 TABLET IMMEDIATELY BEFORE BEDTIME AS NEEDED FOR SLEEP   fluticasone (FLONASE) 50 MCG/ACT nasal spray Place 1 spray into both nostrils daily as needed  for allergies.   HYDROcodone-acetaminophen (NORCO) 7.5-325 MG tablet Take 1 tablet by mouth 3 (three) times daily as needed for moderate pain (pain score 4-6).   hydrocortisone (ANUSOL-HC) 2.5 % rectal cream PLACE 1 APPLICATION RECTALLY 2 (TWO) TIMES DAILY.   ketoconazole (NIZORAL) 2 % cream Apply 1 Application topically daily. Apply to rash twice a day. If no improvement after 2 weeks, please let provider know.   loratadine (CLARITIN) 10 MG tablet Take 10 mg by mouth daily as needed for allergies.   mometasone (ELOCON) 0.1 % cream Apply very thin layer to rash at bedtime. May use one other time during the day if needed. Stop after 7 days, if still needed may repeat after 3 day break.   pregabalin (LYRICA) 75 MG capsule Take 1 capsule (75 mg total) by mouth 2 (two) times daily.   Vitamin D, Ergocalciferol, (DRISDOL) 1.25 MG (50000 UNIT) CAPS capsule TAKE 1 CAPSULE (50,000 UNITS TOTAL) BY MOUTH EVERY 7 (SEVEN) DAYS   Zoster Vaccine Adjuvanted [facility]) injection Inject 0.5 mLs into the muscle once for 1 dose. Repeat in 2-6 months. Please fax confirmation of vaccination to Shawna Clamp, DNP at [phone]   [DISCONTINUED] allopurinol (ZYLOPRIM) 100 MG tablet TAKE 2 TABLETS BY MOUTH DAILY   [DISCONTINUED] amLODipine (NORVASC) 5 MG tablet Take 1  tablet (5 mg total) by mouth daily.   [DISCONTINUED] losartan (COZAAR) 50 MG tablet Take 1 tablet (50 mg total) by mouth daily.   allopurinol (ZYLOPRIM) 100 MG tablet Take 2 tablets (200 mg total) by mouth daily.   amLODipine (NORVASC) 5 MG tablet Take 1 tablet (5 mg total) by mouth daily.   amLODipine-valsartan (EXFORGE) 5-160 MG tablet TAKE 1 TABLET BY MOUTH EVERY DAY   lidocaine (LIDODERM) 5 % Place 1 patch onto the skin daily. Remove & Discard patch within 12 hours or as directed by MD   losartan (COZAAR) 50 MG tablet Take 1 tablet (50 mg total) by mouth daily.   prednisoLONE acetate (PRED FORTE) 1 % ophthalmic suspension SMARTSIG:In Eye(s) (Patient not taking: Reported on 04/25/2023)   Facility-Administered Encounter Medications as of 04/25/2023  Medication   capsaicin topical system 8 % patch 1 patch    Physical Exam: No Physical Exam Skin:    General: Skin is warm and dry.     Capillary Refill: Capillary refill takes less than 2 seconds.     Findings: Rash present.          Comments: Crusted, healing rash scattered along dermotome on the right upper extremity.      PLAN  Assessment and Plan Shingles (Herpes Zoster) Experienced shingles in January with a rash from the head to the arms. The rash has crusted over but still shows evidence of healing. Reports ongoing pain and sensitivity in the arm, likely postherpetic neuralgia, which can persist for months or be permanent. Pain might subside once the rash fully heals, but this is uncertain. - Examine the rash and back - Recommend waiting at least two months post-shingles before administering the shingles vaccine  Peripheral Neuropathy Reports numbness and stabbing pain in the heel following back surgery, which has since subsided but still experiences numbness. Previous EMG suggested peripheral neuropathy, likely due to nerve damage during surgery. - Follow up with the back surgeon at [facility] - Review MRI and CT scan  results from July and August  Right Hand Pain Reports a painful knot in the right palm for the past two months, possibly related to computer use. Differential diagnosis  includes deep bruising, fibromyalgia nodularity, or a cyst. Repetitive strain from computer use could be a contributing factor. - Order ultrasound of the right hand - Advise monitoring for any aggravating factors related to computer use  Depression Reports feeling depressed, possibly situational due to retirement, ongoing health issues, and lack of social interaction. Discussed the impact of chronic pain on mental health and the importance of social activities and hobbies. Explained that cognitive behavioral therapy (CBT) can help reframe negative thoughts and improve mood. Suggested 'three good things' journaling as a simple CBT technique. - Recommend cognitive behavioral therapy (CBT) - Encourage practicing 'three good things' journaling - Consider referral to a counselor if symptoms persist  Hyperlipidemia Family history of hyperlipidemia and concerned about hereditary hyperlipidemia. Previous cardiologist visit noted high cholesterol. Discussed the importance of monitoring cholesterol levels and potential use of atorvastatin, starting with a low dose and gradually increasing if tolerated. Explained that hereditary hyperlipidemia can be more challenging to manage with diet alone. - Check cholesterol levels - Discuss potential use of atorvastatin, starting with a low dose and gradually increasing if tolerated  General Health Maintenance Due for a shingles vaccine and has concerns about cataracts. Declines mammograms. - Administer shingles vaccine after two months post-shingles - Refer to an ophthalmologist for cataract evaluation if vision changes persist - Note declines mammograms  Follow-up - Follow up with back surgeon at [facility] - Check labs including creatinine levels - Refill Farxiga 5 mg, losartan, and  amlodipine - Provide samples of Farxiga if available. Exercise Activities and Dietary Recommendations - choose a type of activity I enjoy such as biking, gardening, team sports, walking - keep track of how long I exercise - keep track of how often I exercise - get a stand-up desk for my workplace - go out for a short walk before breakfast, after dinner or both - replace a coffee break with a brisk 10-minute walk; ask a friend to go with me - take the stairs instead of the elevator Cardiac and Carb modified diet  Fall Prevention - always use handrails on the stairs - always wear shoes or slippers with non-slip sole - get at least 10 minutes of activity every day - keep a flashlight by the bed - keep cell phone with me always - make an emergency alert plan in case I fall  Orders Placed This Encounter  Procedures   [place] RT UPPER EXTREM LTD SOFT TISSUE NON VASCULAR    INS: [employer] MCR PART B EPIC ORDER  WT: 202 NO NEEDS  NO GLUCOSE MONITOR  NO STIMULATORS  NO INJECTORS  PT AWARE OF $75 NO SHOW FEE  PT AWARE TO BRING PHOTO ID & INS CARD  [facility] W/ PT/DT    Standing Status:   Future    Expiration Date:   04/24/2024    Reason for Exam (SYMPTOM  OR DIAGNOSIS REQUIRED):   knot in palm of hand, pain.    Preferred imaging location?:   [facility]   CBC with Differential/Platelet   CMP14+EGFR   Hemoglobin A1c   Lipid panel   Uric Acid     I have personally reviewed and noted the following in the patient's chart:   Medical and social history Use of alcohol, tobacco or illicit drugs  Current medications and supplements Functional ability and status Nutritional status Physical activity Advanced directives List of other physicians Hospitalizations, surgeries, and ER visits in previous 12 months Vitals Screenings to include cognitive, depression, and falls Referrals and  appointments  In addition, I have reviewed and discussed with patient certain preventive protocols, quality  metrics, and best practice recommendations. A written personalized care plan for preventive services as well as general preventive health recommendations were provided to patient.   Tollie Eth, NP  04/25/2023

## 2023-04-25 NOTE — Patient Instructions (Addendum)
Work towards finding hobbies that bring you joy. It is also very important to get out of the house and spend time with others. This can be helpful for your mental health.   You can try Three Good Things journaling to see if this helps retrain your brain to a positive mindset and help to shift away from depressive thinking.   Work on getting at least 10 minutes of physical activity daily to help promote good mental health. This can be SO important.   Monitor the right hand and see if there is anything that you can correlate with the painful area. I have placed an order for an ultrasound to evaluate the area. They will call you to schedule this.   I will be in touch with you once we get your labs back and if we need to make any changes to anything we can discuss this.    Goal: Work on 3 Good Things once a day.

## 2023-04-26 ENCOUNTER — Ambulatory Visit
Admission: RE | Admit: 2023-04-26 | Discharge: 2023-04-26 | Disposition: A | Discharge status: Home / Self Care | Payer: Self-pay | Source: Ambulatory Visit | Attending: Nurse Practitioner | Admitting: Nurse Practitioner

## 2023-04-26 ENCOUNTER — Encounter: Payer: Self-pay | Admitting: Nurse Practitioner

## 2023-04-26 DIAGNOSIS — M79641 Pain in right hand: Secondary | ICD-10-CM

## 2023-04-26 LAB — CMP14+EGFR
ALT: 11 IU/L (ref 0–32)
AST: 17 IU/L (ref 0–40)
Albumin: 4.3 g/dL (ref 3.9–4.9)
Alkaline Phosphatase: 111 IU/L (ref 44–121)
BUN/Creatinine Ratio: 11 — ABNORMAL LOW (ref 12–28)
BUN: 15 mg/dL (ref 8–27)
Bilirubin Total: 0.4 mg/dL (ref 0.0–1.2)
CO2: 23 mmol/L (ref 20–29)
Calcium: 9.7 mg/dL (ref 8.7–10.3)
Chloride: 105 mmol/L (ref 96–106)
Creatinine, Ser: 1.36 mg/dL — ABNORMAL HIGH (ref 0.57–1.00)
Globulin, Total: 2.9 g/dL (ref 1.5–4.5)
Glucose: 92 mg/dL (ref 70–99)
Potassium: 4.6 mmol/L (ref 3.5–5.2)
Sodium: 140 mmol/L (ref 134–144)
Total Protein: 7.2 g/dL (ref 6.0–8.5)
eGFR: 42 mL/min/{1.73_m2} — ABNORMAL LOW (ref >59)

## 2023-04-26 LAB — CBC WITH DIFFERENTIAL/PLATELET
Basophils Absolute: 0.1 10*3/uL (ref 0.0–0.2)
Basos: 1 %
EOS (ABSOLUTE): 0.2 10*3/uL (ref 0.0–0.4)
Eos: 5 %
Hematocrit: 38.5 % (ref 34.0–46.6)
Hemoglobin: 12.6 g/dL (ref 11.1–15.9)
Immature Grans (Abs): 0 10*3/uL (ref 0.0–0.1)
Immature Granulocytes: 0 %
Lymphocytes Absolute: 2.2 10*3/uL (ref 0.7–3.1)
Lymphs: 47 %
MCH: 29.4 pg (ref 26.6–33.0)
MCHC: 32.7 g/dL (ref 31.5–35.7)
MCV: 90 fL (ref 79–97)
Monocytes Absolute: 0.4 10*3/uL (ref 0.1–0.9)
Monocytes: 9 %
Neutrophils Absolute: 1.7 10*3/uL (ref 1.4–7.0)
Neutrophils: 38 %
Platelets: 334 10*3/uL (ref 150–450)
RBC: 4.28 x10E6/uL (ref 3.77–5.28)
RDW: 15.1 % (ref 11.7–15.4)
WBC: 4.6 10*3/uL (ref 3.4–10.8)

## 2023-04-26 LAB — HEMOGLOBIN A1C
Est. average glucose Bld gHb Est-mCnc: 103 mg/dL
Hgb A1c MFr Bld: 5.2 % (ref 4.8–5.6)

## 2023-04-26 LAB — URIC ACID: Uric Acid: 3.7 mg/dL (ref 3.0–7.2)

## 2023-04-26 LAB — LIPID PANEL
Cholesterol, Total: 239 mg/dL — ABNORMAL HIGH (ref 100–199)
HDL: 71 mg/dL (ref >39)
LDL CALC COMMENT:: 3.4 ratio (ref 0.0–4.4)
LDL Chol Calc (NIH): 155 mg/dL — ABNORMAL HIGH (ref 0–99)
Triglycerides: 78 mg/dL (ref 0–149)
VLDL Cholesterol Cal: 13 mg/dL (ref 5–40)

## 2023-04-27 ENCOUNTER — Telehealth: Payer: Self-pay

## 2023-04-27 NOTE — Telephone Encounter (Signed)
 Pt.notified

## 2023-04-27 NOTE — Telephone Encounter (Signed)
 Fax received regarding shingrix rx sent on 04/25/23. States [facility] no longer dispenses vaccines.

## 2023-04-28 NOTE — Progress Notes (Signed)
 Subjective:    Patient ID: Amy Hooper, female    DOB: 1950-06-27, 73 y.o.   MRN: [id number]  HPI: Amy Hooper is a 73 y.o. female who returns for follow up appointment for chronic pain and medication refill. She states her pain is located in her right shoulder and right arm ( post herpetic neuralgia). Also reports bilateral hip pain,right knee, left foot with tingling and burning, generalized joint and Fibro pain.  She rates her pain 4. Her current exercise regime is walking and performing stretching exercises.  [name] Morphine  equivalent is 22.50 MME.   UDS ordered     Pain Inventory Average Pain 6 Pain Right Now 4 My pain is constant, sharp, burning, tingling, and aching  In the last 24 hours, has pain interfered with the following? General activity 7 Relation with others 6 Enjoyment of life 6 What TIME of day is your pain at its worst? varies Sleep (in general) Fair  Pain is worse with: walking, bending, sitting, inactivity, standing, and some activites Pain improves with: rest, heat/ice, therapy/exercise, pacing activities, and medication Relief from Meds: 3  Family History  Problem Relation Age of Onset   Hypothyroidism Mother    Hyperlipidemia Mother    Miscarriages / Stillbirths Mother    Hyperlipidemia Other    Kidney failure Father    Dementia Father    Gout Father    Arthritis Father    Prostate cancer Father    Heart disease Father    Hypertension Father    Kidney disease Father    Stroke Father    Social History   Socioeconomic History   Marital status: Married    Spouse name: Not on file   Number of children: 3   Years of education: Not on file   Highest education level: Bachelor's degree (e.g., BA, AB, BS)  Occupational History   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     Social Drivers of Corporate Investment Banker Strain: Low Risk  (04/25/2023)   Overall Financial Resource Strain (CARDIA)    Difficulty of Paying Living Expenses: Not very hard  Food Insecurity: No Food Insecurity (04/25/2023)   Hunger Vital Sign    Worried About Running Out of Food in the Last Year: Never true    Ran Out of Food in the Last Year: Never true  Transportation Needs: No Transportation Needs (04/25/2023)   PRAPARE - Administrator, Civil Service (Medical): No    Lack of Transportation (Non-Medical): No  Physical Activity: Insufficiently Active (04/25/2023)   Exercise Vital Sign    Days of Exercise per Week: 5 days    Minutes of Exercise per Session: 20 min  Stress: Stress Concern Present (04/25/2023)   [employer] of Occupational Health - Occupational Stress Questionnaire    Feeling of Stress : Rather much  Social Connections: Moderately Isolated (04/25/2023)   Social Connection and Isolation Panel [NHANES]  Frequency of Communication with Friends and Family: More than three times a week    Frequency of Social Gatherings with Friends and Family: Once a week    Attends Religious Services: Never    Database Administrator or Organizations: No    Attends Banker Meetings: Never    Marital Status: Married   Past Surgical History:  Procedure Laterality Date   APPLICATION OF WOUND VAC N/A 03/25/2021   Procedure: APPLICATION OF WOUND VAC;  Surgeon: Barbarann Oneil BROCKS, MD;  Location: [facility];  Service: Orthopedics;  Laterality: N/A;   APPLICATION OF WOUND VAC N/A 03/30/2021   Procedure: WOUND VAC CHANGE 12x6x5;  Surgeon: Barbarann Oneil BROCKS, MD;  Location: [facility] OR;  Service:  Orthopedics;  Laterality: N/A;   INCISION AND DRAINAGE OF WOUND N/A 03/25/2021   Procedure: LUMBAR POST OP INCISION IRRIGATION;  Surgeon: Barbarann Oneil BROCKS, MD;  Location: [facility];  Service: Orthopedics;  Laterality: N/A;   JOINT REPLACEMENT     KNEE ARTHROSCOPY     LUMBAR WOUND DEBRIDEMENT N/A 03/30/2021   Procedure: REPEAT LUMBAR WOUND DEBRIDEMENT;  Surgeon: Barbarann Oneil BROCKS, MD;  Location: [facility];  Service: Orthopedics;  Laterality: N/A;   right rotator cuff     ROTATOR CUFF REPAIR Left    TOTAL KNEE ARTHROPLASTY Left 09/08/2020   Procedure: LEFT TOTAL KNEE ARTHROPLASTY;  Surgeon: Jerri Kay HERO, MD;  Location: [facility];  Service: Orthopedics;  Laterality: Left;   TUBAL LIGATION     Past Surgical History:  Procedure Laterality Date   APPLICATION OF WOUND VAC N/A 03/25/2021   Procedure: APPLICATION OF WOUND VAC;  Surgeon: Barbarann Oneil BROCKS, MD;  Location: [facility];  Service: Orthopedics;  Laterality: N/A;   APPLICATION OF WOUND VAC N/A 03/30/2021   Procedure: WOUND VAC CHANGE 12x6x5;  Surgeon: Barbarann Oneil BROCKS, MD;  Location: [facility];  Service: Orthopedics;  Laterality: N/A;   INCISION AND DRAINAGE OF WOUND N/A 03/25/2021   Procedure: LUMBAR POST OP INCISION IRRIGATION;  Surgeon: Barbarann Oneil BROCKS, MD;  Location: [facility];  Service: Orthopedics;  Laterality: N/A;   JOINT REPLACEMENT     KNEE ARTHROSCOPY     LUMBAR WOUND DEBRIDEMENT N/A 03/30/2021   Procedure: REPEAT LUMBAR WOUND DEBRIDEMENT;  Surgeon: Barbarann Oneil BROCKS, MD;  Location: [facility];  Service: Orthopedics;  Laterality: N/A;   right rotator cuff     ROTATOR CUFF REPAIR Left    TOTAL KNEE ARTHROPLASTY Left 09/08/2020   Procedure: LEFT TOTAL KNEE ARTHROPLASTY;  Surgeon: Jerri Kay HERO, MD;  Location: [facility];  Service: Orthopedics;  Laterality: Left;   TUBAL LIGATION     Past Medical History:  Diagnosis Date   Allergy 1976   Tramadol  NSAIDS   Chronic kidney disease    Depression    Fibromyalgia    Gout    HTN (hypertension)    Hyperlipidemia 09/01/22   Insomnia     Morbid obesity (HCC)    Non-recurrent acute suppurative otitis media of left ear without spontaneous rupture of tympanic membrane 04/30/2022   Osteoarthritis    Palpitation    Sleep apnea    There were no vitals taken for this visit.  Opioid Risk Score:   Fall Risk Score:  `1  Depression screen PHQ 2/9     04/25/2023    8:16 AM 01/12/2023    8:21 AM 12/13/2022    1:47 PM 11/15/2022   11:12 AM 11/05/2022    1:10 PM 10/14/2022    9:59  AM 09/06/2022    9:47 AM  Depression screen PHQ 2/9  Decreased Interest 1 0 0 0 0 0 1  Down, Depressed, Hopeless 3 0 0 0 0 0 1  PHQ - 2 Score 4 0 0 0 0 0 2  Altered sleeping 3        Tired, decreased energy 3        Change in appetite 1        Feeling bad or failure about yourself  1        Trouble concentrating 1        Moving slowly or fidgety/restless 0        Suicidal thoughts 0        PHQ-9 Score 13        Difficult doing work/chores Somewhat difficult          Review of Systems  Constitutional: Negative.   HENT: Negative.    Eyes: Negative.   Respiratory: Negative.    Cardiovascular: Negative.   Gastrointestinal: Negative.   Endocrine: Negative.   Genitourinary: Negative.   Musculoskeletal:  Positive for arthralgias and myalgias.  Skin: Negative.   Allergic/Immunologic: Negative.   Neurological: Negative.   Hematological: Negative.   Psychiatric/Behavioral: Negative.        Objective:   Physical Exam Vitals and nursing note reviewed.  Constitutional:      Appearance: Normal appearance.  Cardiovascular:     Rate and Rhythm: Normal rate and regular rhythm.     Pulses: Normal pulses.     Heart sounds: Normal heart sounds.  Pulmonary:     Effort: Pulmonary effort is normal.     Breath sounds: Normal breath sounds.  Musculoskeletal:     Comments: Normal Muscle Bulk and Muscle Testing Reveals:  Upper Extremities: Right: Decreased ROM 90 Degrees  and Muscle Strength 5/5 Right AC Joint Hypersensitivity ( Post Herpectic  Neuralgia)  Left Upper Extremity: Full ROM and Muscle Strength 5/5  Lumbar Paraspinal Tenderness: L-4-L-5 Bilateral Greater Trochanter Tenderness Lower Extremities : Right: Decreased ROM and Muscle Strength 5/5 Right Lower Extremity Flexion Produces Pain into her Right Patella Left Lower Extremity: Full ROM and Muscle Strength 5/5 Arises Table slowly using cane for support Narrow Based Gait     Skin:    General: Skin is warm and dry.  Neurological:     Mental Status: She is alert and oriented to person, place, and time.  Psychiatric:        Mood and Affect: Mood normal.        Behavior: Behavior normal.          Assessment & Plan:  1, Right Lumbar Radiculitis: No Complaints today. Continue Pregabalin .  Neurosurgeon at [facility] following. Continue to Monitor. 04/29/2023 Chronic Bilateral Low Back Pain: S/P Lumbar Fusion: Dr Barbarann Following. Continue HEP as Tolerated. Continue to Monitor. 04/29/2023 2.. Post Operative Infection: ID Following: Continue to Monitor. 04/29/2023 3. Fibromyalgia: Continue HEP as Tolerated. Continue Lyrica . Continue to Monitor. 04/29/2023 4. Primary Osteoarthritis: Left Knee: S/P on 09/08/20: Dr JERRI: LEFT TOTAL KNEE ARTHROPLASTY. Continue HEP as Tolerated. Continue to Monitor. 04/29/2023 5. Primary Osteoarthritis Right Knee: Continue HEP as Tolerated. Ortho Following. Continue to Monitor. 04/29/2023 6. Chronic Pain Syndrome: Refilled:  Hydrocodone  7.5 mg /325 one tablet three times a  a day as needed for pain #90. Second script sent to accommodate scheduled appointment  Continue Amitriptyline   We will continue the opioid monitoring program, this consists of regular clinic visits, examinations, urine  drug screen, pill counts as well as use of [place]  Controlled Substance Reporting system. A 12 month History has been reviewed on the [place]  Controlled Substance Reporting System on 04/29/2023 7. Depression: Continue Cymbalta . Continue to  Monitor. 04/29/2023  8. Bradycardia: Pulse Stable Today. Cardiology Following: Continue to Monitor. 04/29/2023 9. PHN: Continue Pregabalin . PCP Following. Continue to Monitor.   F/U in 2 months,

## 2023-04-29 ENCOUNTER — Encounter: Payer: Self-pay | Admitting: Registered Nurse

## 2023-04-29 ENCOUNTER — Encounter: Payer: Medicare Other | Attending: Registered Nurse | Admitting: Registered Nurse

## 2023-04-29 VITALS — BP 143/89 | HR 76 | Ht 62.0 in | Wt 201.2 lb

## 2023-04-29 DIAGNOSIS — B0229 Other postherpetic nervous system involvement: Secondary | ICD-10-CM | POA: Insufficient documentation

## 2023-04-29 DIAGNOSIS — M7061 Trochanteric bursitis, right hip: Secondary | ICD-10-CM | POA: Insufficient documentation

## 2023-04-29 DIAGNOSIS — M7062 Trochanteric bursitis, left hip: Secondary | ICD-10-CM | POA: Insufficient documentation

## 2023-04-29 DIAGNOSIS — G894 Chronic pain syndrome: Secondary | ICD-10-CM | POA: Diagnosis not present

## 2023-04-29 DIAGNOSIS — M25561 Pain in right knee: Secondary | ICD-10-CM | POA: Diagnosis present

## 2023-04-29 DIAGNOSIS — M2550 Pain in unspecified joint: Secondary | ICD-10-CM | POA: Insufficient documentation

## 2023-04-29 DIAGNOSIS — G8929 Other chronic pain: Secondary | ICD-10-CM | POA: Diagnosis present

## 2023-04-29 DIAGNOSIS — M25511 Pain in right shoulder: Secondary | ICD-10-CM | POA: Insufficient documentation

## 2023-04-29 DIAGNOSIS — Z5181 Encounter for therapeutic drug level monitoring: Secondary | ICD-10-CM | POA: Insufficient documentation

## 2023-04-29 DIAGNOSIS — M792 Neuralgia and neuritis, unspecified: Secondary | ICD-10-CM | POA: Diagnosis present

## 2023-04-29 DIAGNOSIS — M797 Fibromyalgia: Secondary | ICD-10-CM | POA: Diagnosis present

## 2023-04-29 DIAGNOSIS — Z79891 Long term (current) use of opiate analgesic: Secondary | ICD-10-CM | POA: Diagnosis not present

## 2023-04-29 MED ORDER — HYDROCODONE-ACETAMINOPHEN 7.5-325 MG PO TABS: 1.0000 | ORAL_TABLET | Freq: Three times a day (TID) | ORAL | 0 refills | Status: DC | PRN

## 2023-05-03 ENCOUNTER — Encounter: Payer: Self-pay | Admitting: Nurse Practitioner

## 2023-05-03 ENCOUNTER — Other Ambulatory Visit: Payer: Self-pay | Admitting: Nurse Practitioner

## 2023-05-03 LAB — TOXASSURE SELECT,+ANTIDEPR,UR

## 2023-05-05 ENCOUNTER — Other Ambulatory Visit: Payer: Self-pay

## 2023-05-05 ENCOUNTER — Encounter: Payer: Self-pay | Admitting: Nurse Practitioner

## 2023-05-05 DIAGNOSIS — G4709 Other insomnia: Secondary | ICD-10-CM

## 2023-05-05 MED ORDER — ATORVASTATIN CALCIUM 10 MG PO TABS: 10.0000 mg | ORAL_TABLET | ORAL | 1 refills | Status: DC

## 2023-05-18 ENCOUNTER — Ambulatory Visit: Payer: 59 | Admitting: Internal Medicine

## 2023-05-19 ENCOUNTER — Other Ambulatory Visit: Payer: Self-pay | Admitting: Nurse Practitioner

## 2023-05-19 ENCOUNTER — Other Ambulatory Visit: Payer: Self-pay

## 2023-05-19 DIAGNOSIS — N1832 Chronic kidney disease, stage 3b: Secondary | ICD-10-CM

## 2023-05-19 DIAGNOSIS — E782 Mixed hyperlipidemia: Secondary | ICD-10-CM

## 2023-05-19 DIAGNOSIS — I10 Essential (primary) hypertension: Secondary | ICD-10-CM

## 2023-05-19 MED ORDER — DAPAGLIFLOZIN PROPANEDIOL 5 MG PO TABS
5.0000 mg | ORAL_TABLET | Freq: Every day | ORAL | 1 refills | Status: DC
Start: 2023-05-19 — End: 2023-09-15

## 2023-05-19 NOTE — Telephone Encounter (Signed)
 Copied from CRM [id number]. Topic: Clinical - Medication Refill >> May 19, 2023  3:43 PM Geroge Baseman wrote: Most Recent Primary Care Visit:  Provider: EARLY, SARA E  Department: [facility]  Visit Type: MEDICARE WELL VISIT 45  Date: 04/25/2023  Medication: dapagliflozin propanediol (FARXIGA) 5 MG TABS tablet  Has the patient contacted their pharmacy? Yes Yes, they have it but they need it [facility] for now and all future refills.  Is this the correct pharmacy for this prescription? Yes If no, delete pharmacy and type the correct one.  This is the patient's preferred pharmacy:    [facility] - [place], [place] - [address] [place] [address] Phone: [phone] Fax: [phone]   Has the prescription been filled recently? No  Is the patient out of the medication? Yes  Has the patient been seen for an appointment in the last year OR does the patient have an upcoming appointment? Yes  Can we respond through MyChart? Yes  Agent: Please be advised that Rx refills may take up to 3 business days. We ask that you follow-up with your pharmacy.

## 2023-06-01 ENCOUNTER — Other Ambulatory Visit: Payer: Self-pay

## 2023-06-01 MED ORDER — VITAMIN D (ERGOCALCIFEROL) 1.25 MG (50000 UNIT) PO CAPS: 50000.0000 [IU] | ORAL_CAPSULE | ORAL | 1 refills | Status: DC

## 2023-06-07 ENCOUNTER — Other Ambulatory Visit: Payer: Self-pay | Admitting: Nurse Practitioner

## 2023-06-20 ENCOUNTER — Other Ambulatory Visit: Payer: Self-pay

## 2023-06-20 ENCOUNTER — Ambulatory Visit: Payer: 59 | Admitting: Internal Medicine

## 2023-06-20 ENCOUNTER — Encounter: Payer: Self-pay | Admitting: Internal Medicine

## 2023-06-20 VITALS — BP 118/79 | HR 68 | Resp 16 | Ht 62.0 in | Wt 205.0 lb

## 2023-06-20 DIAGNOSIS — M4626 Osteomyelitis of vertebra, lumbar region: Secondary | ICD-10-CM

## 2023-06-20 MED ORDER — ESZOPICLONE 1 MG PO TABS: 1.0000 mg | ORAL_TABLET | Freq: Every evening | ORAL | 1 refills | Status: DC | PRN

## 2023-06-20 NOTE — Progress Notes (Signed)
 Patient Active Problem List   Diagnosis Date Noted   PAC (premature atrial contraction) 12/20/2022   Forearm tendonitis 07/23/2022   Rash and nonspecific skin eruption 07/23/2022   Asymptomatic bradycardia 07/23/2022   Rheumatoid arthritis with rheumatoid factor of multiple sites without organ or systems involvement (HCC) 04/30/2022   Osteopenia 03/03/2022   Degenerative spondylolisthesis 02/19/2021   Status post total left knee replacement 09/08/2020   OSA (obstructive sleep apnea) 05/19/2020   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/17/2020   Chronic pain syndrome 03/03/2020   S/P lumbar fusion 02/05/2020   Vitamin D deficiency 01/09/2020   Hyperlipidemia 01/09/2020   CKD (chronic kidney disease) stage 3, GFR 30-59 ml/min (HCC) 01/09/2020   HTN (hypertension)    Morbid obesity (HCC)    Gout    Depression    Insomnia    Chronically low serum potassium 04/22/2014   Environmental and seasonal allergies 04/22/2014   Palpitations 04/22/2014    Patient's Medications  New Prescriptions   No medications on file  Previous Medications   ALLOPURINOL (ZYLOPRIM) 100 MG TABLET    Take 2 tablets (200 mg total) by mouth daily.   AMLODIPINE (NORVASC) 5 MG TABLET    Take 1 tablet (5 mg total) by mouth daily.   ATORVASTATIN (LIPITOR) 10 MG TABLET    TAKE 1 TABLET BY MOUTH 3 TIMES  WEEKLY   DAPAGLIFLOZIN PROPANEDIOL (FARXIGA) 5 MG TABS TABLET    Take 1 tablet (5 mg total) by mouth daily.   DULOXETINE (CYMBALTA) 20 MG CAPSULE    TAKE 1 CAPSULE BY MOUTH DAILY   ESZOPICLONE (LUNESTA) 1 MG TABS TABLET    Take 1 tablet (1 mg total) by mouth at bedtime as needed for sleep. Take immediately before bedtime   FLUTICASONE (FLONASE) 50 MCG/ACT NASAL SPRAY    Place 1 spray into both nostrils daily as needed for allergies.   HYDROCODONE-ACETAMINOPHEN (NORCO) 7.5-325 MG TABLET    Take 1 tablet by mouth 3 (three) times daily as needed for moderate pain (pain score 4-6).    HYDROCORTISONE (ANUSOL-HC) 2.5 % RECTAL CREAM    PLACE 1 APPLICATION RECTALLY 2 (TWO) TIMES DAILY.   KETOCONAZOLE (NIZORAL) 2 % CREAM    Apply 1 Application topically daily. Apply to rash twice a day. If no improvement after 2 weeks, please let provider know.   LORATADINE (CLARITIN) 10 MG TABLET    Take 10 mg by mouth daily as needed for allergies.   LOSARTAN (COZAAR) 50 MG TABLET    Take 1 tablet (50 mg total) by mouth daily.   MOMETASONE (ELOCON) 0.1 % CREAM    Apply very thin layer to rash at bedtime. May use one other time during the day if needed. Stop after 7 days, if still needed may repeat after 3 day break.   PREGABALIN (LYRICA) 75 MG CAPSULE    Take 1 capsule (75 mg total) by mouth 2 (two) times daily.   VITAMIN D, ERGOCALCIFEROL, (DRISDOL) 1.25 MG (50000 UNIT) CAPS CAPSULE    Take 1 capsule (50,000 Units total) by mouth every 7 (seven) days.  Modified Medications   No medications on file  Discontinued Medications   PREDNISOLONE ACETATE (PRED FORTE) 1 % OPHTHALMIC SUSPENSION        Subjective: Amy Hooper is a  73 year old female with obesity, CKD, L4-L5 stenosis anterolisthesis status post L4-5 transforaminal lumbar interbody fusion with pedicle instrumentation cage lateral fusion right L5-S1 microdiscectomy on 03/09/2021.  Patient developed drainage from surgical site infection hospitalized underwent I&D(03/25/21) of lumbar incision.  OR cultures positive for Enterobacter aeruginosa, Morganella and Bacteroides fragilis.  She underwent repeat debridement in the OR due to purulent drainage on 03/30/21. Patient was initially on cefazolin transitioned to cefpime then metronidazole added.  Plan was to complete 2 weeks of IV therapy with cefepime and metronidazole (04/14/21) then transition to levaquin and metro to complete 8 weeks of antibiotics from OR on 03/30/21. She does have retained hardware as suuch will need suppressive antibiotics with metro and levaquin.  Interval 1/24: Changed to  levaquin and metronidazole 05/19/21:Wound vac in place. Followed by Dr. Sheila Oats). Pt reports tolerating levaquin and metronidazole.  06/11/21: no new complaints 07/30/21: Presents  due to GI symptoms. Pt has loss of appetitie, loose stools 5-6 times/day. Loose stools started  in the beginning of April. Pt has been taking percocet PRN less often since April. She notices stools are more formed with percocet. Denies, fever, chills, N,V. Pt had been on probiotics, but has not been taking them since mid April.  She is on levaquin 25 mg PO qd for chronic suppression. Seen by Dr. Ophelia Charter on 4/26 and wound noted to be completely healed. Wound vac off.  8/15: reprots muscl epain throughout her body. 12/21/21: Pt she has back pain with certain activities. Dneis fever or chills. Reports no issues with tolerating suppresive antibiotics.   06/28/22: Tolerating abx. No diarrhea.   08/10/22: She has been off of abx. Pt has some back pain after gardening. No fevers or chills. 09/20/22: PT states she has back pain that radiates to behind her legs. Is going to [facility] for second opinion. Denies fevers and chills   11/15/22:  Doing well, No fever or chills off of abx Today 06/20/23:no new complaints   Review of Systems: Review of Systems  All other systems reviewed and are negative.   Past Medical History:  Diagnosis Date   Allergy 1976   Tramadol NSAIDS   Chronic kidney disease    Depression    Fibromyalgia    Gout    HTN (hypertension)    Hyperlipidemia 09/01/22   Insomnia    Morbid obesity (HCC)    Non-recurrent acute suppurative otitis media of left ear without spontaneous rupture of tympanic membrane 04/30/2022   Osteoarthritis    Palpitation    Sleep apnea     Social History   Tobacco Use   Smoking status: Never    Passive exposure: Current   Smokeless tobacco: Never   Tobacco comments:    I don't smoke  Vaping Use   Vaping status: Never Used  Substance Use Topics   Alcohol use: Not  Currently    Comment: No alcohol since I've been taking opioids   Drug use: Never    Family History  Problem Relation Age of Onset   Hypothyroidism Mother    Hyperlipidemia Mother    Miscarriages / [place] Mother    Hyperlipidemia Other    Kidney failure Father    Dementia Father    Gout Father    Arthritis Father    Prostate cancer Father    Heart disease Father    Hypertension Father    Kidney disease Father    Stroke Father     Allergies  Allergen Reactions   Cefuroxime Hives and Rash    Other reaction(s): Unknown  Other Reaction(s): Unknown   Cefuroxime Axetil Rash and Hives   Clarithromycin Hives and Rash  Other reaction(s): Unknown  Other Reaction(s): Unknown   Nsaids Nausea And Vomiting    Other reaction(s): Unknown  Other Reaction(s): Unknown   Hydrocodone     Other reaction(s): Unknown  Other Reaction(s): Unknown   Hydrocodone-Acetaminophen Other (See Comments)    Stomach upset  Other Reaction(s): Other  Stomach upset    Stomach upset,  Stomach upset    Stomach upset,     Stomach upset  Stomach upset,   Other Other (See Comments) and Nausea And Vomiting    Upset stomach  Other Reaction(s): Other (See Comments), Unknown  Upset stomach    Upset stomach Other reaction(s): Unknown   Oxycodone-Acetaminophen Other (See Comments)    Other reaction(s): Unknown  Other Reaction(s): Other, Unknown  Hallucination   Robaxin [Methocarbamol] Nausea And Vomiting    "It tears my stomach up."   Toradol [Ketorolac Tromethamine] Nausea And Vomiting   Tramadol Nausea And Vomiting and Other (See Comments)    Upset stomach  Other reaction(s): Unknown  Other Reaction(s): Other, Unknown  Upset stomach    Upset stomach Other reaction(s): Unknown    Health Maintenance  Topic Date Due   Zoster Vaccines- Shingrix (1 of 2) Never done   COVID-19 Vaccine (4 - 2024-25 season) 11/21/2022   INFLUENZA VACCINE  06/20/2023 (Originally 10/21/2022)    MAMMOGRAM  04/24/2024 (Originally 01/01/2023)   Colonoscopy  01/04/2024   Medicare Annual Wellness (AWV)  04/24/2024   DTaP/Tdap/Td (2 - Td or Tdap) 01/07/2030   Pneumonia Vaccine 29+ Years old  Completed   DEXA SCAN  Completed   HPV VACCINES  Aged Out   Hepatitis C Screening  Discontinued    Objective:  Vitals:   06/20/23 1431  BP: 118/79  Pulse: 68  Resp: 16  Weight: 205 lb (93 kg)  Height: 5\' 2"  (1.575 m)   Body mass index is 37.49 kg/m.  Physical Exam Constitutional:      Appearance: Normal appearance.  HENT:     Head: Normocephalic and atraumatic.     Right Ear: Tympanic membrane normal.     Left Ear: Tympanic membrane normal.     Nose: Nose normal.     Mouth/Throat:     Mouth: Mucous membranes are moist.  Eyes:     Extraocular Movements: Extraocular movements intact.     Conjunctiva/sclera: Conjunctivae normal.     Pupils: Pupils are equal, round, and reactive to light.  Cardiovascular:     Rate and Rhythm: Normal rate and regular rhythm.     Heart sounds: No murmur heard.    No friction rub. No gallop.  Pulmonary:     Effort: Pulmonary effort is normal.     Breath sounds: Normal breath sounds.  Abdominal:     General: Abdomen is flat.     Palpations: Abdomen is soft.  Musculoskeletal:        General: Normal range of motion.  Skin:    General: Skin is warm and dry.  Neurological:     General: No focal deficit present.     Mental Status: She is alert and oriented to person, place, and time.  Psychiatric:        Mood and Affect: Mood normal.    Physical Exam          Lab Results Lab Results  Component Value Date   WBC 4.6 04/25/2023   HGB 12.6 04/25/2023   HCT 38.5 04/25/2023   MCV 90 04/25/2023   PLT 334 04/25/2023    Lab Results  Component Value Date   CREATININE 1.36 (H) 04/25/2023   BUN 15 04/25/2023   NA 140 04/25/2023   K 4.6 04/25/2023   CL 105 04/25/2023   CO2 23 04/25/2023    Lab Results  Component Value Date   ALT 11  04/25/2023   AST 17 04/25/2023   ALKPHOS 111 04/25/2023   BILITOT 0.4 04/25/2023    Lab Results  Component Value Date   CHOL 239 (H) 04/25/2023   HDL 71 04/25/2023   LDLCALC 155 (H) 04/25/2023   TRIG 78 04/25/2023   CHOLHDL 3.4 04/25/2023   No results found for: "LABRPR", "RPRTITER" No results found for: "HIV1RNAQUANT", "HIV1RNAVL", "CD4TABS"   Problem List Items Addressed This Visit   None  Results          Assessment/Plan #Lumbar wound infection with retained hardware SP I&D on 03/25/21  #OR Cx from 03/25/21 +  enterobacter aeruginosa, Morganella morganii,  bacteroides fragilis(beta lactamase positive) #Medication management -Completed 2 weeks of cefepime from OR on 1/24 and started on levaquin 750mg  PO q48h(renally dosed) and  metronidazole 500mg  PO bid(EOT 05/25/21). Then continued on levaquin and metronidazole for chronic suppression. Metronidazole changed to augmentin due to muscle soreness. Normal CK on 8/15 -labs 10/2 3.5 crp, esr 29 -[facility]. Pt is tolerating antibiotics, she noted she may be getting a second surgery for her back. No tenderness note don exam, labs stable as above.  I thin kit ok to trial pt off of PO suppression. I discussed if she has fever, chills, worsening back pain to call clinic and would likely restart antibiotics.  - On 06/28/22 stopped augmentin 500mg  PO bid and levaquin 500 per as pt  completed one year of suppression. -5/3 esr and crp stable 34/5 respectively. 7/11 45/5.7. MRI spine showed no evience of discitis/osto. Pt followed by [facility].for right leg numbness and low back pain( distal/remote from site of surgery)->emg planned/possible epidural inj. Discussed that although no signs of infection on MRI and stable labs, inj carry a risk of introducing infection Plan: -Labs today -F/u prn     Danelle Earthly, MD [facility] 06/20/2023, 2:56 PM

## 2023-06-21 LAB — C-REACTIVE PROTEIN: CRP: 7.1 mg/L (ref <8.0)

## 2023-06-21 LAB — COMPLETE METABOLIC PANEL WITHOUT GFR
AG Ratio: 1.3 (calc) (ref 1.0–2.5)
ALT: 11 U/L (ref 6–29)
AST: 17 U/L (ref 10–35)
Albumin: 4 g/dL (ref 3.6–5.1)
Alkaline phosphatase (APISO): 108 U/L (ref 37–153)
BUN/Creatinine Ratio: 11 (calc) (ref 6–22)
BUN: 17 mg/dL (ref 7–25)
CO2: 24 mmol/L (ref 20–32)
Calcium: 9.6 mg/dL (ref 8.6–10.4)
Chloride: 105 mmol/L (ref 98–110)
Creat: 1.48 mg/dL — ABNORMAL HIGH (ref 0.60–1.00)
Globulin: 3 g/dL (ref 1.9–3.7)
Glucose, Bld: 77 mg/dL (ref 65–99)
Potassium: 4.5 mmol/L (ref 3.5–5.3)
Sodium: 139 mmol/L (ref 135–146)
Total Bilirubin: 0.5 mg/dL (ref 0.2–1.2)
Total Protein: 7 g/dL (ref 6.1–8.1)

## 2023-06-21 LAB — CBC WITH DIFFERENTIAL/PLATELET
Absolute Lymphocytes: 2064 {cells}/uL (ref 850–3900)
Absolute Monocytes: 510 {cells}/uL (ref 200–950)
Basophils Absolute: 30 {cells}/uL (ref 0–200)
Basophils Relative: 0.5 %
Eosinophils Absolute: 192 {cells}/uL (ref 15–500)
Eosinophils Relative: 3.2 %
HCT: 36.4 % (ref 35.0–45.0)
Hemoglobin: 11.9 g/dL (ref 11.7–15.5)
MCH: 29.5 pg (ref 27.0–33.0)
MCHC: 32.7 g/dL (ref 32.0–36.0)
MCV: 90.3 fL (ref 80.0–100.0)
MPV: 10.2 fL (ref 7.5–12.5)
Monocytes Relative: 8.5 %
Neutro Abs: 3204 {cells}/uL (ref 1500–7800)
Neutrophils Relative %: 53.4 %
Platelets: 331 10*3/uL (ref 140–400)
RBC: 4.03 10*6/uL (ref 3.80–5.10)
RDW: 14.1 % (ref 11.0–15.0)
Total Lymphocyte: 34.4 %
WBC: 6 10*3/uL (ref 3.8–10.8)

## 2023-06-21 LAB — SEDIMENTATION RATE: Sed Rate: 34 mm/h — ABNORMAL HIGH (ref 0–30)

## 2023-06-28 ENCOUNTER — Encounter: Payer: Medicare Other | Admitting: Registered Nurse

## 2023-06-29 ENCOUNTER — Encounter: Attending: Registered Nurse | Admitting: Registered Nurse

## 2023-06-29 VITALS — BP 137/81 | HR 48 | Ht 62.0 in | Wt 203.0 lb

## 2023-06-29 DIAGNOSIS — M2550 Pain in unspecified joint: Secondary | ICD-10-CM | POA: Diagnosis present

## 2023-06-29 DIAGNOSIS — R001 Bradycardia, unspecified: Secondary | ICD-10-CM | POA: Insufficient documentation

## 2023-06-29 DIAGNOSIS — M797 Fibromyalgia: Secondary | ICD-10-CM | POA: Insufficient documentation

## 2023-06-29 DIAGNOSIS — Z79891 Long term (current) use of opiate analgesic: Secondary | ICD-10-CM | POA: Diagnosis present

## 2023-06-29 DIAGNOSIS — M25561 Pain in right knee: Secondary | ICD-10-CM | POA: Diagnosis present

## 2023-06-29 DIAGNOSIS — G8929 Other chronic pain: Secondary | ICD-10-CM | POA: Insufficient documentation

## 2023-06-29 DIAGNOSIS — Z5181 Encounter for therapeutic drug level monitoring: Secondary | ICD-10-CM | POA: Insufficient documentation

## 2023-06-29 DIAGNOSIS — G894 Chronic pain syndrome: Secondary | ICD-10-CM | POA: Diagnosis present

## 2023-06-29 DIAGNOSIS — M545 Low back pain, unspecified: Secondary | ICD-10-CM | POA: Insufficient documentation

## 2023-06-29 MED ORDER — HYDROCODONE-ACETAMINOPHEN 7.5-325 MG PO TABS: 1.0000 | ORAL_TABLET | Freq: Three times a day (TID) | ORAL | 0 refills | Status: DC | PRN

## 2023-06-29 MED ORDER — PREGABALIN 75 MG PO CAPS: 75.0000 mg | ORAL_CAPSULE | Freq: Two times a day (BID) | ORAL | 1 refills | Status: DC

## 2023-06-29 NOTE — Progress Notes (Unsigned)
 Subjective:    Patient ID: Amy Hooper, female    DOB: 1950/10/28, 73 y.o.   MRN: [id number]  HPI: Amy Hooper is a 73 y.o. female who returns for follow up appointment for chronic pain and medication refill. states *** pain is located in  ***. rates pain ***. current exercise regime is walking and performing stretching exercises.  [name] Morphine equivalent is *** MME.   Last UDS was Perormed on 04/29/2023, it was consistent.     Pain Inventory Average Pain {NUMBERS; 0-10:5044} Pain Right Now {NUMBERS; 0-10:5044} My pain is {PAIN DESCRIPTION:21022940}  In the last 24 hours, has pain interfered with the following? General activity {NUMBERS; 0-10:5044} Relation with others {NUMBERS; 0-10:5044} Enjoyment of life {NUMBERS; 0-10:5044} What TIME of day is your pain at its worst? {time of day:24191} Sleep (in general) {BHH GOOD/FAIR/POOR:22877}  Pain is worse with: {ACTIVITIES:21022942} Pain improves with: {PAIN IMPROVES WUJW:11914782} Relief from Meds: {NUMBERS; 0-10:5044}  Family History  Problem Relation Age of Onset  . Hypothyroidism Mother   . Hyperlipidemia Mother   . Miscarriages / [place] Mother   . Hyperlipidemia Other   . Kidney failure Father   . Dementia Father   . Gout Father   . Arthritis Father   . Prostate cancer Father   . Heart disease Father   . Hypertension Father   . Kidney disease Father   . Stroke Father    Social History   Socioeconomic History  . Marital status: Married    Spouse name: Not on file  . Number of children: 3  . Years of education: Not on file  . Highest education level: Bachelor's degree (e.g., BA, AB, BS)  Occupational History  . 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 Smoking status: Never    Passive exposure: Current  . Smokeless tobacco: Never  . Tobacco comments:    I don't smoke  Vaping Use  . Vaping status: Never Used  Substance and Sexual Activity  . Alcohol use: Not Currently    Comment: No  alcohol since I've been taking opioids  . Drug use: Never  . Sexual activity: Yes    Birth control/protection: Surgical, None  Other Topics Concern  . Not on file  Social History Narrative   Lives gives with fiance.     Social Drivers of Corporate investment banker Strain: Low Risk  (04/25/2023)   Overall Financial Resource Strain (CARDIA)   . Difficulty of Paying Living Expenses: Not very hard  Food Insecurity: No Food Insecurity (04/25/2023)   Hunger Vital Sign   . Worried About Programme researcher, broadcasting/film/video in the Last Year: Never true   . Ran Out of Food in the Last Year: Never true  Transportation Needs: No Transportation Needs (04/25/2023)   PRAPARE - Transportation   . Lack of Transportation (Medical): No   . Lack of Transportation (Non-Medical): No  Physical Activity: Insufficiently Active (04/25/2023)   Exercise Vital Sign   . Days of Exercise per Week: 5 days   . Minutes of Exercise per Session: 20 min  Stress: Stress Concern Present (04/25/2023)   [employer] of Occupational Health - Occupational Stress Questionnaire   . Feeling of Stress : Rather much  Social Connections: Moderately Isolated (04/25/2023)   Social Connection and Isolation Panel [NHANES]   . Frequency of Communication with Friends and Family: More than three times a week   . Frequency of Social Gatherings with Friends and Family: Once a week   .  Attends Religious Services: Never   . Active Member of Clubs or Organizations: No   . Attends Banker Meetings: Never   . Marital Status: Married   Past Surgical History:  Procedure Laterality Date  . APPLICATION OF WOUND VAC N/A 03/25/2021   Procedure: APPLICATION OF WOUND VAC;  Surgeon: Eldred Manges, MD;  Location: [facility] OR;  Service: Orthopedics;  Laterality: N/A;  . APPLICATION OF WOUND VAC N/A 03/30/2021   Procedure: WOUND VAC CHANGE 12x6x5;  Surgeon: Eldred Manges, MD;  Location: [facility];  Service: Orthopedics;  Laterality: N/A;  . INCISION AND DRAINAGE OF  WOUND N/A 03/25/2021   Procedure: LUMBAR POST OP INCISION IRRIGATION;  Surgeon: Eldred Manges, MD;  Location: [facility];  Service: Orthopedics;  Laterality: N/A;  . JOINT REPLACEMENT    . KNEE ARTHROSCOPY    . LUMBAR WOUND DEBRIDEMENT N/A 03/30/2021   Procedure: REPEAT LUMBAR WOUND DEBRIDEMENT;  Surgeon: Eldred Manges, MD;  Location: [facility];  Service: Orthopedics;  Laterality: N/A;  . right rotator cuff    . ROTATOR CUFF REPAIR Left   . TOTAL KNEE ARTHROPLASTY Left 09/08/2020   Procedure: LEFT TOTAL KNEE ARTHROPLASTY;  Surgeon: Tarry Kos, MD;  Location: [facility];  Service: Orthopedics;  Laterality: Left;  . TUBAL LIGATION     Past Surgical History:  Procedure Laterality Date  . APPLICATION OF WOUND VAC N/A 03/25/2021   Procedure: APPLICATION OF WOUND VAC;  Surgeon: Eldred Manges, MD;  Location: [facility];  Service: Orthopedics;  Laterality: N/A;  . APPLICATION OF WOUND VAC N/A 03/30/2021   Procedure: WOUND VAC CHANGE 12x6x5;  Surgeon: Eldred Manges, MD;  Location: [facility];  Service: Orthopedics;  Laterality: N/A;  . INCISION AND DRAINAGE OF WOUND N/A 03/25/2021   Procedure: LUMBAR POST OP INCISION IRRIGATION;  Surgeon: Eldred Manges, MD;  Location: [facility];  Service: Orthopedics;  Laterality: N/A;  . JOINT REPLACEMENT    . KNEE ARTHROSCOPY    . LUMBAR WOUND DEBRIDEMENT N/A 03/30/2021   Procedure: REPEAT LUMBAR WOUND DEBRIDEMENT;  Surgeon: Eldred Manges, MD;  Location: [facility];  Service: Orthopedics;  Laterality: N/A;  . right rotator cuff    . ROTATOR CUFF REPAIR Left   . TOTAL KNEE ARTHROPLASTY Left 09/08/2020   Procedure: LEFT TOTAL KNEE ARTHROPLASTY;  Surgeon: Tarry Kos, MD;  Location: [facility];  Service: Orthopedics;  Laterality: Left;  . TUBAL LIGATION     Past Medical History:  Diagnosis Date  . Allergy 1976   Tramadol NSAIDS  . Chronic kidney disease   . Depression   . Fibromyalgia   . Gout   . HTN (hypertension)   . Hyperlipidemia 09/01/22  . Insomnia   . Morbid obesity (HCC)   .  Non-recurrent acute suppurative otitis media of left ear without spontaneous rupture of tympanic membrane 04/30/2022  . Osteoarthritis   . Palpitation   . Sleep apnea    There were no vitals taken for this visit.  Opioid Risk Score:   Fall Risk Score:  `1  Depression screen [facility] 2/9     06/20/2023    2:43 PM 04/25/2023    8:16 AM 01/12/2023    8:21 AM 12/13/2022    1:47 PM 11/15/2022   11:12 AM 11/05/2022    1:10 PM 10/14/2022    9:59 AM  Depression screen PHQ 2/9  Decreased Interest 0 1 0 0 0 0 0  Down, Depressed, Hopeless 0 3 0 0  0 0 0  PHQ - 2 Score 0 4 0 0 0 0 0  Altered sleeping  3       Tired, decreased energy  3       Change in appetite  1       Feeling bad or failure about yourself   1       Trouble concentrating  1       Moving slowly or fidgety/restless  0       Suicidal thoughts  0       PHQ-9 Score  13       Difficult doing work/chores  Somewhat difficult          Review of Systems  Constitutional: Negative.   HENT: Negative.    Eyes: Negative.   Respiratory: Negative.    Endocrine: Negative.   Genitourinary: Negative.   Musculoskeletal:  Positive for arthralgias and back pain.  Skin: Negative.   Allergic/Immunologic: Negative.   Neurological: Negative.   Hematological: Negative.   Psychiatric/Behavioral: Negative.        Objective:   Physical Exam        Assessment & Plan:  1, Right Lumbar Radiculitis: No Complaints today. Continue Pregabalin.  Neurosurgeon at [facility] following. Continue to Monitor. 04/29/2023 Chronic Bilateral Low Back Pain: S/P Lumbar Fusion: Dr Ophelia Charter Following. Continue HEP as Tolerated. Continue to Monitor. 04/29/2023 2.. Post Operative Infection: ID Following: Continue to Monitor. 04/29/2023 3. Fibromyalgia: Continue HEP as Tolerated. Continue Lyrica. Continue to Monitor. 04/29/2023 4. Primary Osteoarthritis: Left Knee: S/P on 09/08/20: Dr Roda Shutters: LEFT TOTAL KNEE ARTHROPLASTY. Continue HEP as Tolerated. Continue to  Monitor. 04/29/2023 5. Primary Osteoarthritis Right Knee: Continue HEP as Tolerated. Ortho Following. Continue to Monitor. 04/29/2023 6. Chronic Pain Syndrome: Refilled:  Hydrocodone 7.5 mg /325 one tablet three times a  a day as needed for pain #90. Second script sent to accommodate scheduled appointment  Continue Amitriptyline  We will continue the opioid monitoring program, this consists of regular clinic visits, examinations, urine drug screen, pill counts as well as use of [place] Controlled Substance Reporting system. A 12 month History has been reviewed on the [place] Controlled Substance Reporting System on 04/29/2023 7. Depression: Continue Cymbalta. Continue to Monitor. 04/29/2023  8. Bradycardia: Pulse Stable Today. Cardiology Following: Continue to Monitor. 04/29/2023 9. PHN: Continue Pregabalin. PCP Following. Continue to Monitor.    F/U in 2 months,

## 2023-07-25 ENCOUNTER — Other Ambulatory Visit

## 2023-07-25 ENCOUNTER — Other Ambulatory Visit: Payer: Self-pay

## 2023-07-25 ENCOUNTER — Encounter: Payer: Self-pay | Admitting: Nurse Practitioner

## 2023-07-25 ENCOUNTER — Ambulatory Visit (INDEPENDENT_AMBULATORY_CARE_PROVIDER_SITE_OTHER): Admitting: Nurse Practitioner

## 2023-07-25 ENCOUNTER — Ambulatory Visit: Payer: Self-pay | Admitting: Nurse Practitioner

## 2023-07-25 DIAGNOSIS — D508 Other iron deficiency anemias: Secondary | ICD-10-CM

## 2023-07-25 DIAGNOSIS — M62838 Other muscle spasm: Secondary | ICD-10-CM

## 2023-07-25 DIAGNOSIS — E876 Hypokalemia: Secondary | ICD-10-CM

## 2023-07-25 DIAGNOSIS — E559 Vitamin D deficiency, unspecified: Secondary | ICD-10-CM

## 2023-07-25 DIAGNOSIS — G4762 Sleep related leg cramps: Secondary | ICD-10-CM

## 2023-07-25 DIAGNOSIS — G894 Chronic pain syndrome: Secondary | ICD-10-CM

## 2023-07-25 NOTE — Progress Notes (Signed)
 Labs only today. Will reschedule visit for 07/26/2023

## 2023-07-25 NOTE — Telephone Encounter (Signed)
 Chief Complaint: Leg cramps at night x2 weeks  Symptoms: Tinnie Forehand out of sleep, toes arched and could barely stand when woke up today, right thigh spasm today, painful Frequency: Ongoing issue, got worse in last 2 weeks intermittent   Disposition: [x] Appointment(In office)  Additional Notes: Patient scheduled for an appointment today with PCP in office.    Copied from CRM [id number]. Topic: Clinical - Red Word Triage >> Jul 25, 2023  9:59 AM Kevelyn M wrote: Red Word that prompted transfer to Nurse Triage: Extreme cramps in legs and feet at night but she just experienced one in on right thigh. It's been going on for 2 weeks, but this morning she could barely walk. The pain this morning was unable. Reason for Disposition  [1] MODERATE pain (e.g., interferes with normal activities) AND [2] present > 3 days  Answer Assessment - Initial Assessment Questions Chief Complaint: Leg cramps at night x2 weeks  Symptoms: Tinnie Forehand out of sleep, toes arched and could barely stand when woke up today, right thigh spasm today, painful  Frequency: Ongoing issue, got worse in last 2 weeks intermittent  Protocols used: Muscle Aches and Body Pain-A-AH

## 2023-07-26 ENCOUNTER — Encounter: Payer: Self-pay | Admitting: Nurse Practitioner

## 2023-07-26 ENCOUNTER — Ambulatory Visit (INDEPENDENT_AMBULATORY_CARE_PROVIDER_SITE_OTHER): Admitting: Nurse Practitioner

## 2023-07-26 VITALS — BP 122/78 | HR 58 | Wt 202.8 lb

## 2023-07-26 DIAGNOSIS — G894 Chronic pain syndrome: Secondary | ICD-10-CM | POA: Diagnosis not present

## 2023-07-26 DIAGNOSIS — G4762 Sleep related leg cramps: Secondary | ICD-10-CM

## 2023-07-26 LAB — CMP14+EGFR
ALT: 11 IU/L (ref 0–32)
AST: 15 IU/L (ref 0–40)
Albumin: 4.2 g/dL (ref 3.9–4.9)
Alkaline Phosphatase: 128 IU/L — ABNORMAL HIGH (ref 44–121)
BUN/Creatinine Ratio: 18 (ref 12–28)
BUN: 27 mg/dL (ref 8–27)
Bilirubin Total: 0.2 mg/dL (ref 0.0–1.2)
CO2: 21 mmol/L (ref 20–29)
Calcium: 9.5 mg/dL (ref 8.7–10.3)
Chloride: 104 mmol/L (ref 96–106)
Creatinine, Ser: 1.54 mg/dL — ABNORMAL HIGH (ref 0.57–1.00)
Globulin, Total: 2.9 g/dL (ref 1.5–4.5)
Glucose: 85 mg/dL (ref 70–99)
Potassium: 4.4 mmol/L (ref 3.5–5.2)
Sodium: 141 mmol/L (ref 134–144)
Total Protein: 7.1 g/dL (ref 6.0–8.5)
eGFR: 36 mL/min/{1.73_m2} — ABNORMAL LOW (ref >59)

## 2023-07-26 LAB — CBC WITH DIFFERENTIAL/PLATELET
Basophils Absolute: 0.1 10*3/uL (ref 0.0–0.2)
Basos: 1 %
EOS (ABSOLUTE): 0.3 10*3/uL (ref 0.0–0.4)
Eos: 4 %
Hematocrit: 40 % (ref 34.0–46.6)
Hemoglobin: 13 g/dL (ref 11.1–15.9)
Immature Grans (Abs): 0 10*3/uL (ref 0.0–0.1)
Immature Granulocytes: 0 %
Lymphocytes Absolute: 2.2 10*3/uL (ref 0.7–3.1)
Lymphs: 34 %
MCH: 29.4 pg (ref 26.6–33.0)
MCHC: 32.5 g/dL (ref 31.5–35.7)
MCV: 91 fL (ref 79–97)
Monocytes Absolute: 0.6 10*3/uL (ref 0.1–0.9)
Monocytes: 9 %
Neutrophils Absolute: 3.4 10*3/uL (ref 1.4–7.0)
Neutrophils: 52 %
Platelets: 329 10*3/uL (ref 150–450)
RBC: 4.42 x10E6/uL (ref 3.77–5.28)
RDW: 13.3 % (ref 11.7–15.4)
WBC: 6.5 10*3/uL (ref 3.4–10.8)

## 2023-07-26 LAB — TSH: TSH: 2.88 u[IU]/mL (ref 0.450–4.500)

## 2023-07-26 LAB — VITAMIN D 25 HYDROXY (VIT D DEFICIENCY, FRACTURES): Vit D, 25-Hydroxy: 46.4 ng/mL (ref 30.0–100.0)

## 2023-07-26 LAB — IRON,TIBC AND FERRITIN PANEL
Ferritin: 64 ng/mL (ref 15–150)
Iron Saturation: 23 % (ref 15–55)
Iron: 70 ug/dL (ref 27–139)
Total Iron Binding Capacity: 308 ug/dL (ref 250–450)
UIBC: 238 ug/dL (ref 118–369)

## 2023-07-26 LAB — MAGNESIUM: Magnesium: 2.4 mg/dL — ABNORMAL HIGH (ref 1.6–2.3)

## 2023-07-26 NOTE — Progress Notes (Unsigned)
 Annella Kief, DNP, AGNP-c [facility] [address] [place], [place] [address] [phone]   ACUTE VISIT- ESTABLISHED PATIENT  Blood pressure 122/78, pulse (!) 58, weight 202 lb 12.8 oz (92 kg).  Subjective:  HPI Amy Hooper is a 73 y.o. female presents to day for evaluation of acute concern(s).   History of Present Illness Amy Hooper "Amy Hooper" is a 73 year old female with fibromyalgia and osteoarthritis who presents with severe leg cramps.  She experiences severe cramping in both legs, particularly in the thighs and calves, with the most intense episodes in the feet and toes. These cramps are described as 'horrible' and are severe enough to prevent walking or standing. They typically occur at night, waking her up, and are triggered by stretching. The cramping has worsened over time, despite having experienced similar symptoms in the past that would 'relax themselves out'.  She has a history of back surgery, after which she experienced numbness in her foot, which is improving, but the cramping has intensified. She has been on atorvastatin  since February and she is 'more tender all over' due to her fibromyalgia and osteoarthritis.  She has a history of shingles, which affected her head and caused hair loss, though her hair is growing back. She is concerned about the possibility of recurrence and mentions that her insurance issues have prevented her from receiving the shingles vaccine. Hereditary high cholesterol is present on both Hooper of her family.  No swelling in the feet. Increased tenderness and pain all over the body. No current swelling in the hands.  ROS negative except for what is listed in HPI. History, Medications, Surgery, SDOH, and Family History reviewed and updated as appropriate.  Objective:  Physical Exam Vitals and nursing note reviewed.  Constitutional:      General: She is not in acute distress.    Appearance: Normal  appearance. She is normal weight.  HENT:     Head: Normocephalic.  Eyes:     Conjunctiva/sclera: Conjunctivae normal.  Neck:     Vascular: No carotid bruit.  Cardiovascular:     Rate and Rhythm: Normal rate and regular rhythm.     Pulses: Normal pulses.     Heart sounds: Normal heart sounds.  Pulmonary:     Effort: Pulmonary effort is normal.     Breath sounds: Normal breath sounds.  Abdominal:     General: Bowel sounds are normal.  Musculoskeletal:        General: Tenderness present.     Right lower leg: Edema present.     Left lower leg: No edema.     Comments: Generalized tenderness throughout the body.  Skin:    General: Skin is warm and dry.     Capillary Refill: Capillary refill takes less than 2 seconds.  Neurological:     Mental Status: She is alert and oriented to person, place, and time.     Sensory: No sensory deficit.     Motor: Weakness present.     Gait: Gait normal.  Psychiatric:        Mood and Affect: Mood normal.        Behavior: Behavior normal.         Assessment & Plan:   Problem List Items Addressed This Visit     Chronic pain syndrome - Primary   Increased tenderness and widespread pain, possibly exacerbated by atorvastatin , consistent with fibromyalgia symptoms. Symptoms have worsened since starting atorvastatin  in February. - Discontinue atorvastatin  for two weeks  to assess improvement in symptoms. - Consider starting Repatha if atorvastatin  is confirmed to be the cause of myalgias.      Nocturnal leg cramps   Severe cramping in both feet and toes, particularly at night. Labs show no significant abnormalities in potassium or magnesium levels. Atorvastatin  is suspected to contribute to muscle cramps. Repatha is considered as an alternative with a lower incidence of muscle pain and cramping. - Discontinue atorvastatin  for two weeks to assess improvement in symptoms. - Consider starting Repatha if atorvastatin  is confirmed to be the cause of  muscle cramps.         Lyndy Russman E Noell Shular, DNP, AGNP-c

## 2023-07-27 ENCOUNTER — Encounter: Payer: Self-pay | Admitting: Nurse Practitioner

## 2023-07-27 DIAGNOSIS — G4762 Sleep related leg cramps: Secondary | ICD-10-CM

## 2023-07-27 HISTORY — DX: Sleep related leg cramps: G47.62

## 2023-07-27 NOTE — Assessment & Plan Note (Signed)
 Increased tenderness and widespread pain, possibly exacerbated by atorvastatin , consistent with fibromyalgia symptoms. Symptoms have worsened since starting atorvastatin  in February. - Discontinue atorvastatin  for two weeks to assess improvement in symptoms. - Consider starting Repatha if atorvastatin  is confirmed to be the cause of myalgias.

## 2023-07-27 NOTE — Assessment & Plan Note (Signed)
 Severe cramping in both feet and toes, particularly at night. Labs show no significant abnormalities in potassium or magnesium levels. Atorvastatin  is suspected to contribute to muscle cramps. Repatha is considered as an alternative with a lower incidence of muscle pain and cramping. - Discontinue atorvastatin  for two weeks to assess improvement in symptoms. - Consider starting Repatha if atorvastatin  is confirmed to be the cause of muscle cramps.

## 2023-08-18 ENCOUNTER — Ambulatory Visit: Payer: Medicare Other | Admitting: Nurse Practitioner

## 2023-08-18 ENCOUNTER — Inpatient Hospital Stay (HOSPITAL_COMMUNITY)

## 2023-08-18 ENCOUNTER — Other Ambulatory Visit: Payer: Self-pay

## 2023-08-18 ENCOUNTER — Encounter: Payer: Self-pay | Admitting: Nurse Practitioner

## 2023-08-18 ENCOUNTER — Ambulatory Visit: Admitting: Internal Medicine

## 2023-08-18 ENCOUNTER — Inpatient Hospital Stay (HOSPITAL_COMMUNITY)
Admission: EM | Admit: 2023-08-18 | Discharge: 2023-08-23 | DRG: 041 | Disposition: A | Discharge status: Rehabilitation Facility | Attending: Internal Medicine | Admitting: Internal Medicine

## 2023-08-18 ENCOUNTER — Emergency Department (HOSPITAL_COMMUNITY)

## 2023-08-18 ENCOUNTER — Encounter (HOSPITAL_COMMUNITY): Payer: Self-pay

## 2023-08-18 DIAGNOSIS — G8929 Other chronic pain: Secondary | ICD-10-CM | POA: Diagnosis present

## 2023-08-18 DIAGNOSIS — Z841 Family history of disorders of kidney and ureter: Secondary | ICD-10-CM | POA: Diagnosis not present

## 2023-08-18 DIAGNOSIS — M7918 Myalgia, other site: Secondary | ICD-10-CM | POA: Diagnosis not present

## 2023-08-18 DIAGNOSIS — I959 Hypotension, unspecified: Secondary | ICD-10-CM | POA: Diagnosis not present

## 2023-08-18 DIAGNOSIS — E1165 Type 2 diabetes mellitus with hyperglycemia: Secondary | ICD-10-CM | POA: Diagnosis present

## 2023-08-18 DIAGNOSIS — E785 Hyperlipidemia, unspecified: Secondary | ICD-10-CM | POA: Diagnosis present

## 2023-08-18 DIAGNOSIS — I63522 Cerebral infarction due to unspecified occlusion or stenosis of left anterior cerebral artery: Secondary | ICD-10-CM | POA: Diagnosis present

## 2023-08-18 DIAGNOSIS — Z6837 Body mass index (BMI) 37.0-37.9, adult: Secondary | ICD-10-CM | POA: Diagnosis not present

## 2023-08-18 DIAGNOSIS — E66812 Obesity, class 2: Secondary | ICD-10-CM | POA: Diagnosis present

## 2023-08-18 DIAGNOSIS — N189 Chronic kidney disease, unspecified: Secondary | ICD-10-CM | POA: Diagnosis not present

## 2023-08-18 DIAGNOSIS — I10 Essential (primary) hypertension: Secondary | ICD-10-CM | POA: Diagnosis present

## 2023-08-18 DIAGNOSIS — G4709 Other insomnia: Secondary | ICD-10-CM | POA: Diagnosis not present

## 2023-08-18 DIAGNOSIS — E1122 Type 2 diabetes mellitus with diabetic chronic kidney disease: Secondary | ICD-10-CM | POA: Diagnosis present

## 2023-08-18 DIAGNOSIS — M797 Fibromyalgia: Secondary | ICD-10-CM | POA: Diagnosis present

## 2023-08-18 DIAGNOSIS — M109 Gout, unspecified: Secondary | ICD-10-CM | POA: Diagnosis present

## 2023-08-18 DIAGNOSIS — M1711 Unilateral primary osteoarthritis, right knee: Secondary | ICD-10-CM | POA: Diagnosis present

## 2023-08-18 DIAGNOSIS — Z79899 Other long term (current) drug therapy: Secondary | ICD-10-CM

## 2023-08-18 DIAGNOSIS — N1831 Chronic kidney disease, stage 3a: Secondary | ICD-10-CM

## 2023-08-18 DIAGNOSIS — I69351 Hemiplegia and hemiparesis following cerebral infarction affecting right dominant side: Secondary | ICD-10-CM

## 2023-08-18 DIAGNOSIS — Z8261 Family history of arthritis: Secondary | ICD-10-CM | POA: Diagnosis not present

## 2023-08-18 DIAGNOSIS — I639 Cerebral infarction, unspecified: Secondary | ICD-10-CM

## 2023-08-18 DIAGNOSIS — F32A Depression, unspecified: Secondary | ICD-10-CM | POA: Diagnosis present

## 2023-08-18 DIAGNOSIS — E669 Obesity, unspecified: Secondary | ICD-10-CM | POA: Diagnosis not present

## 2023-08-18 DIAGNOSIS — G4700 Insomnia, unspecified: Secondary | ICD-10-CM | POA: Diagnosis present

## 2023-08-18 DIAGNOSIS — E1140 Type 2 diabetes mellitus with diabetic neuropathy, unspecified: Secondary | ICD-10-CM | POA: Diagnosis present

## 2023-08-18 DIAGNOSIS — E782 Mixed hyperlipidemia: Secondary | ICD-10-CM

## 2023-08-18 DIAGNOSIS — N1832 Chronic kidney disease, stage 3b: Secondary | ICD-10-CM | POA: Diagnosis present

## 2023-08-18 DIAGNOSIS — Z7984 Long term (current) use of oral hypoglycemic drugs: Secondary | ICD-10-CM | POA: Diagnosis not present

## 2023-08-18 DIAGNOSIS — I6389 Other cerebral infarction: Secondary | ICD-10-CM | POA: Diagnosis not present

## 2023-08-18 DIAGNOSIS — M542 Cervicalgia: Secondary | ICD-10-CM | POA: Diagnosis present

## 2023-08-18 DIAGNOSIS — I491 Atrial premature depolarization: Secondary | ICD-10-CM | POA: Diagnosis present

## 2023-08-18 DIAGNOSIS — I129 Hypertensive chronic kidney disease with stage 1 through stage 4 chronic kidney disease, or unspecified chronic kidney disease: Secondary | ICD-10-CM | POA: Diagnosis present

## 2023-08-18 DIAGNOSIS — K219 Gastro-esophageal reflux disease without esophagitis: Secondary | ICD-10-CM | POA: Diagnosis present

## 2023-08-18 DIAGNOSIS — R29706 NIHSS score 6: Secondary | ICD-10-CM | POA: Diagnosis present

## 2023-08-18 DIAGNOSIS — G4733 Obstructive sleep apnea (adult) (pediatric): Secondary | ICD-10-CM | POA: Diagnosis present

## 2023-08-18 DIAGNOSIS — G629 Polyneuropathy, unspecified: Secondary | ICD-10-CM

## 2023-08-18 DIAGNOSIS — Z8042 Family history of malignant neoplasm of prostate: Secondary | ICD-10-CM | POA: Diagnosis not present

## 2023-08-18 DIAGNOSIS — F321 Major depressive disorder, single episode, moderate: Secondary | ICD-10-CM | POA: Diagnosis present

## 2023-08-18 DIAGNOSIS — Z96652 Presence of left artificial knee joint: Secondary | ICD-10-CM | POA: Diagnosis present

## 2023-08-18 DIAGNOSIS — I63422 Cerebral infarction due to embolism of left anterior cerebral artery: Secondary | ICD-10-CM | POA: Diagnosis not present

## 2023-08-18 DIAGNOSIS — R252 Cramp and spasm: Secondary | ICD-10-CM | POA: Diagnosis present

## 2023-08-18 DIAGNOSIS — M1A09X0 Idiopathic chronic gout, multiple sites, without tophus (tophi): Secondary | ICD-10-CM | POA: Diagnosis not present

## 2023-08-18 DIAGNOSIS — Z823 Family history of stroke: Secondary | ICD-10-CM | POA: Diagnosis not present

## 2023-08-18 DIAGNOSIS — R002 Palpitations: Secondary | ICD-10-CM | POA: Diagnosis not present

## 2023-08-18 DIAGNOSIS — R42 Dizziness and giddiness: Secondary | ICD-10-CM | POA: Diagnosis present

## 2023-08-18 DIAGNOSIS — F320 Major depressive disorder, single episode, mild: Secondary | ICD-10-CM

## 2023-08-18 DIAGNOSIS — Z8249 Family history of ischemic heart disease and other diseases of the circulatory system: Secondary | ICD-10-CM | POA: Diagnosis not present

## 2023-08-18 DIAGNOSIS — N183 Chronic kidney disease, stage 3 unspecified: Secondary | ICD-10-CM | POA: Diagnosis present

## 2023-08-18 HISTORY — DX: Cerebral infarction, unspecified: I63.9

## 2023-08-18 LAB — CBC
HCT: 40.4 % (ref 36.0–46.0)
Hemoglobin: 13.5 g/dL (ref 12.0–15.0)
MCH: 29.7 pg (ref 26.0–34.0)
MCHC: 33.4 g/dL (ref 30.0–36.0)
MCV: 88.8 fL (ref 80.0–100.0)
Platelets: 322 10*3/uL (ref 150–400)
RBC: 4.55 MIL/uL (ref 3.87–5.11)
RDW: 13.8 % (ref 11.5–15.5)
WBC: 4.9 10*3/uL (ref 4.0–10.5)
nRBC: 0 % (ref 0.0–0.2)

## 2023-08-18 LAB — DIFFERENTIAL
Abs Immature Granulocytes: 0.01 10*3/uL (ref 0.00–0.07)
Basophils Absolute: 0 10*3/uL (ref 0.0–0.1)
Basophils Relative: 1 %
Eosinophils Absolute: 0.2 10*3/uL (ref 0.0–0.5)
Eosinophils Relative: 4 %
Immature Granulocytes: 0 %
Lymphocytes Relative: 36 %
Lymphs Abs: 1.8 10*3/uL (ref 0.7–4.0)
Monocytes Absolute: 0.4 10*3/uL (ref 0.1–1.0)
Monocytes Relative: 9 %
Neutro Abs: 2.5 10*3/uL (ref 1.7–7.7)
Neutrophils Relative %: 50 %

## 2023-08-18 LAB — COMPREHENSIVE METABOLIC PANEL WITH GFR
ALT: 15 U/L (ref 0–44)
AST: 20 U/L (ref 15–41)
Albumin: 3.7 g/dL (ref 3.5–5.0)
Alkaline Phosphatase: 105 U/L (ref 38–126)
Anion gap: 9 (ref 5–15)
BUN: 12 mg/dL (ref 8–23)
CO2: 25 mmol/L (ref 22–32)
Calcium: 9.7 mg/dL (ref 8.9–10.3)
Chloride: 104 mmol/L (ref 98–111)
Creatinine, Ser: 1.25 mg/dL — ABNORMAL HIGH (ref 0.44–1.00)
GFR, Estimated: 46 mL/min — ABNORMAL LOW (ref >60)
Glucose, Bld: 88 mg/dL (ref 70–99)
Potassium: 4.3 mmol/L (ref 3.5–5.1)
Sodium: 138 mmol/L (ref 135–145)
Total Bilirubin: 0.6 mg/dL (ref 0.0–1.2)
Total Protein: 7.6 g/dL (ref 6.5–8.1)

## 2023-08-18 LAB — ETHANOL: Alcohol, Ethyl (B): <15 mg/dL (ref <15)

## 2023-08-18 LAB — HEMOGLOBIN A1C
Hgb A1c MFr Bld: 5 % (ref 4.8–5.6)
Mean Plasma Glucose: 96.8 mg/dL

## 2023-08-18 LAB — I-STAT CHEM 8, ED
BUN: 13 mg/dL (ref 8–23)
Calcium, Ion: 1.25 mmol/L (ref 1.15–1.40)
Chloride: 107 mmol/L (ref 98–111)
Creatinine, Ser: 1.4 mg/dL — ABNORMAL HIGH (ref 0.44–1.00)
Glucose, Bld: 85 mg/dL (ref 70–99)
HCT: 40 % (ref 36.0–46.0)
Hemoglobin: 13.6 g/dL (ref 12.0–15.0)
Potassium: 4.3 mmol/L (ref 3.5–5.1)
Sodium: 142 mmol/L (ref 135–145)
TCO2: 29 mmol/L (ref 22–32)

## 2023-08-18 LAB — LIPID PANEL
Cholesterol: 226 mg/dL — ABNORMAL HIGH (ref 0–200)
HDL: 78 mg/dL (ref >40)
LDL Cholesterol: 136 mg/dL — ABNORMAL HIGH (ref 0–99)
Total CHOL/HDL Ratio: 2.9 ratio
Triglycerides: 62 mg/dL (ref <150)
VLDL: 12 mg/dL (ref 0–40)

## 2023-08-18 LAB — CBG MONITORING, ED: Glucose-Capillary: 73 mg/dL (ref 70–99)

## 2023-08-18 LAB — MAGNESIUM: Magnesium: 2.2 mg/dL (ref 1.7–2.4)

## 2023-08-18 LAB — APTT: aPTT: 28 s (ref 24–36)

## 2023-08-18 LAB — PROTIME-INR
INR: 1 (ref 0.8–1.2)
Prothrombin Time: 13.2 s (ref 11.4–15.2)

## 2023-08-18 LAB — RAPID URINE DRUG SCREEN, HOSP PERFORMED
Amphetamines: NOT DETECTED
Barbiturates: NOT DETECTED
Benzodiazepines: NOT DETECTED
Cocaine: NOT DETECTED
Opiates: POSITIVE — AB
Tetrahydrocannabinol: NOT DETECTED

## 2023-08-18 MED ORDER — ZOLPIDEM TARTRATE 5 MG PO TABS
5.0000 mg | ORAL_TABLET | Freq: Every day | ORAL | Status: DC
  Administered 2023-08-18 – 2023-08-22 (×5): 5 mg via ORAL
  Filled 2023-08-18 (×5): qty 1

## 2023-08-18 MED ORDER — ACETAMINOPHEN 160 MG/5ML PO SOLN: 650.0000 mg | ORAL | Status: DC | PRN

## 2023-08-18 MED ORDER — HYDROCODONE-ACETAMINOPHEN 7.5-325 MG PO TABS
1.0000 | ORAL_TABLET | Freq: Three times a day (TID) | ORAL | Status: DC | PRN
  Administered 2023-08-19 – 2023-08-23 (×6): 1 via ORAL
  Filled 2023-08-18 (×6): qty 1

## 2023-08-18 MED ORDER — CLOPIDOGREL BISULFATE 300 MG PO TABS
300.0000 mg | ORAL_TABLET | Freq: Once | ORAL | Status: AC
  Administered 2023-08-18: 300 mg via ORAL
  Filled 2023-08-18: qty 1

## 2023-08-18 MED ORDER — FLUTICASONE PROPIONATE 50 MCG/ACT NA SUSP
1.0000 | Freq: Every day | NASAL | Status: DC | PRN
  Administered 2023-08-22: 1 via NASAL
  Filled 2023-08-18: qty 16

## 2023-08-18 MED ORDER — PREGABALIN 75 MG PO CAPS
75.0000 mg | ORAL_CAPSULE | Freq: Two times a day (BID) | ORAL | Status: DC
  Administered 2023-08-18 – 2023-08-23 (×10): 75 mg via ORAL
  Filled 2023-08-18 (×10): qty 1

## 2023-08-18 MED ORDER — MIDAZOLAM HCL 2 MG/2ML IJ SOLN
2.0000 mg | Freq: Once | INTRAMUSCULAR | Status: AC | PRN
  Administered 2023-08-18: 2 mg via INTRAVENOUS
  Filled 2023-08-18: qty 2

## 2023-08-18 MED ORDER — IOHEXOL 350 MG/ML SOLN
75.0000 mL | Freq: Once | INTRAVENOUS | Status: AC | PRN
Start: 2023-08-18 — End: 2023-08-18
  Administered 2023-08-18: 75 mL via INTRAVENOUS

## 2023-08-18 MED ORDER — SODIUM CHLORIDE 0.9 % IV SOLN: INTRAVENOUS | Status: DC

## 2023-08-18 MED ORDER — ACETAMINOPHEN 325 MG PO TABS
650.0000 mg | ORAL_TABLET | ORAL | Status: DC | PRN
  Administered 2023-08-18 – 2023-08-20 (×3): 650 mg via ORAL
  Filled 2023-08-18 (×4): qty 2

## 2023-08-18 MED ORDER — MIDAZOLAM HCL 2 MG/2ML IJ SOLN: 2.0000 mg | Freq: Once | INTRAMUSCULAR | Status: DC | PRN

## 2023-08-18 MED ORDER — SENNOSIDES-DOCUSATE SODIUM 8.6-50 MG PO TABS: 1.0000 | ORAL_TABLET | Freq: Every evening | ORAL | Status: DC | PRN

## 2023-08-18 MED ORDER — ENOXAPARIN SODIUM 40 MG/0.4ML IJ SOSY
40.0000 mg | PREFILLED_SYRINGE | INTRAMUSCULAR | Status: DC
  Administered 2023-08-18 – 2023-08-22 (×5): 40 mg via SUBCUTANEOUS
  Filled 2023-08-18 (×5): qty 0.4

## 2023-08-18 MED ORDER — DULOXETINE HCL 20 MG PO CPEP
20.0000 mg | ORAL_CAPSULE | Freq: Every evening | ORAL | Status: DC
  Administered 2023-08-18 – 2023-08-22 (×5): 20 mg via ORAL
  Filled 2023-08-18 (×5): qty 1

## 2023-08-18 MED ORDER — DAPAGLIFLOZIN PROPANEDIOL 5 MG PO TABS
5.0000 mg | ORAL_TABLET | Freq: Every day | ORAL | Status: DC
  Administered 2023-08-19 – 2023-08-23 (×5): 5 mg via ORAL
  Filled 2023-08-18 (×5): qty 1

## 2023-08-18 MED ORDER — LORATADINE 10 MG PO TABS
10.0000 mg | ORAL_TABLET | Freq: Every day | ORAL | Status: DC | PRN
  Administered 2023-08-22 – 2023-08-23 (×2): 10 mg via ORAL
  Filled 2023-08-18 (×2): qty 1

## 2023-08-18 MED ORDER — ASPIRIN 81 MG PO CHEW
81.0000 mg | CHEWABLE_TABLET | Freq: Once | ORAL | Status: AC
  Administered 2023-08-18: 81 mg via ORAL
  Filled 2023-08-18: qty 1

## 2023-08-18 MED ORDER — ALLOPURINOL 100 MG PO TABS
200.0000 mg | ORAL_TABLET | Freq: Every day | ORAL | Status: DC
  Administered 2023-08-19 – 2023-08-23 (×5): 200 mg via ORAL
  Filled 2023-08-18 (×5): qty 2

## 2023-08-18 MED ORDER — STROKE: EARLY STAGES OF RECOVERY BOOK
Freq: Once | Status: AC
  Filled 2023-08-18: qty 1

## 2023-08-18 MED ORDER — ACETAMINOPHEN 650 MG RE SUPP: 650.0000 mg | RECTAL | Status: DC | PRN

## 2023-08-18 NOTE — ED Triage Notes (Signed)
 Pt bib ems from home. Pt was working at her desk around 2pm and then got up at 6pm - noticed weakness in her right leg and arm and tingling in her right leg. Pt then went to bed and woke up this morning at 853 with right leg weakness and tingling, abnormal gait due to the weakness. EMS notes Decreased grip strength on right arm and cannot pick up her right leg at all. Pt a.o

## 2023-08-18 NOTE — Progress Notes (Addendum)
 MRI called and ready for pt. Transport contacted. Versed  administered.  2020: Transport to MRI with pt. 2140: Pt back on floor. Cardiac monitoring active. IVF restarted.

## 2023-08-18 NOTE — Plan of Care (Signed)
  Problem: Education: Goal: Knowledge of disease or condition will improve Outcome: Progressing   Problem: Education: Goal: Knowledge of secondary prevention will improve (MUST DOCUMENT ALL) Outcome: Progressing   Problem: Education: Goal: Knowledge of patient specific risk factors will improve (DELETE if not current risk factor) Outcome: Progressing   Problem: Ischemic Stroke/TIA Tissue Perfusion: Goal: Complications of ischemic stroke/TIA will be minimized Outcome: Progressing   Problem: Coping: Goal: Will identify appropriate support needs Outcome: Progressing   Problem: Health Behavior/Discharge Planning: Goal: Ability to manage health-related needs will improve Outcome: Progressing Goal: Goals will be collaboratively established with patient/family Outcome: Progressing   Problem: Self-Care: Goal: Ability to participate in self-care as condition permits will improve Outcome: Progressing   Problem: Education: Goal: Knowledge of General Education information will improve Description: Including pain rating scale, medication(s)/side effects and non-pharmacologic comfort measures Outcome: Progressing   Problem: Health Behavior/Discharge Planning: Goal: Ability to manage health-related needs will improve Outcome: Progressing   Problem: Activity: Goal: Risk for activity intolerance will decrease Outcome: Progressing   Problem: Coping: Goal: Level of anxiety will decrease Outcome: Progressing   Problem: Safety: Goal: Ability to remain free from injury will improve Outcome: Progressing

## 2023-08-18 NOTE — Evaluation (Signed)
 Physical Therapy Evaluation Patient Details Name: Amy Hooper MRN: [id number] DOB: [birth year] Today's Date: 08/18/2023  History of Present Illness  73 y.o. female presenting 5/29 to the emergency department with weakness.  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 PMH  hypertension, hyperlipidemia  Clinical Impression  Pt admitted with above diagnosis. Ind PTA. Husband currently out of town caring for his mother. Patient required min assist for bed mobility, CGA to stand from edge of bed with RW for support, and Min assist for gait. Poor Rt foot clearance with steps. Able to bear weight with RLE without buckle during evaluation. Patient will benefit from intensive inpatient follow-up therapy, >3 hours/day. Will continue to follow and progress during admission, as tolerated. Pt currently with functional limitations due to the deficits listed below (see PT Problem List). Pt will benefit from acute skilled PT to increase their independence and safety with mobility to allow discharge.           If plan is discharge home, recommend the following: A little help with walking and/or transfers;A little help with bathing/dressing/bathroom;Assistance with cooking/housework;Help with stairs or ramp for entrance;Assist for transportation   Can travel by private vehicle        Equipment Recommendations None recommended by PT  Recommendations for Other Services  Rehab consult    Functional Status Assessment Patient has had a recent decline in their functional status and demonstrates the ability to make significant improvements in function in a reasonable and predictable amount of time.     Precautions / Restrictions Precautions Precautions: Fall Recall of Precautions/Restrictions: Intact Restrictions Weight Bearing Restrictions Per Provider Order: No      Mobility  Bed Mobility Overal bed mobility:  Needs Assistance Bed Mobility: Supine to Sit, Sit to Supine     Supine to sit: Min assist Sit to supine: Min assist   General bed mobility comments: Min assist for RLE support, and pt pulls through RUE with therapist's assist. Educated on using LLE to support RLE back into bed with min assist to support.    Transfers Overall transfer level: Needs assistance Equipment used: Rolling walker (2 wheels) Transfers: Sit to/from Stand Sit to Stand: Contact guard assist           General transfer comment: Close CGA to rise from bed with cues for hand placement. Very slow to rise.    Ambulation/Gait Ambulation/Gait assistance: Min assist Gait Distance (Feet): 10 Feet Assistive device: Rolling walker (2 wheels) Gait Pattern/deviations: Step-to pattern, Decreased stride length, Decreased step length - right, Decreased dorsiflexion - right, Steppage, Ataxic Gait velocity: dec Gait velocity interpretation: <1.31 ft/sec, indicative of household ambulator   General Gait Details: Educated on safe AD use with RW for support. Poor foot clearance on Rt, with drop foot, attempting to compensate with steppage gait. Instructions for RW to unload. Able to bear weight through RLE without buckle. poor coordination and placement of RLE. Min assist for RW control with turn.  Stairs            Wheelchair Mobility     Tilt Bed    Modified Rankin (Stroke Patients Only) Modified Rankin (Stroke Patients Only) Pre-Morbid Rankin Score: No symptoms Modified Rankin: Moderately severe disability     Balance Overall balance assessment: Needs assistance Sitting-balance support: No upper extremity supported, Feet supported Sitting balance-Leahy Scale: Normal     Standing balance support: Single extremity supported Standing balance-Leahy Scale:  Poor Standing balance comment: RW for support                             Pertinent Vitals/Pain Pain Assessment Pain Assessment:  Faces Faces Pain Scale: Hurts little more Pain Location: toes and back Pain Descriptors / Indicators: Cramping Pain Intervention(s): Limited activity within patient's tolerance, Monitored during session, Repositioned    Home Living Family/patient expects to be discharged to:: Private residence Living Arrangements: Spouse/significant other Available Help at Discharge: Family;Available 24 hours/day (Husband currently Out of Town but can Return if needed) Type of Home: House Home Access: Stairs to enter Entrance Stairs-Rails: Doctor, general practice of Steps: 7   Home Layout: One level Home Equipment: Rollator (4 wheels);Cane - single point      Prior Function Prior Level of Function : Independent/Modified Independent;Driving             Mobility Comments: Ind, denies falls ADLs Comments: ind     Extremity/Trunk Assessment   Upper Extremity Assessment Upper Extremity Assessment: Defer to OT evaluation    Lower Extremity Assessment Lower Extremity Assessment: RLE deficits/detail RLE Deficits / Details: MMT inconsistent with functional strength observed in weight bearing: MMT - Hip flexion 1/5, knee extension 2/5 but held resistance of 4/5 in limited range, knee flexion  1/5, ankle dorsiflexion 1/5, hallux extension 0/5, ankle PF, 1/5, hip abduction 2/5. + Hoover sign. Able to raise bil heels in standing with UE support. Able to perform SLS on Rt with RW for support. RLE Sensation:  (SILT; but reports numbness throughout RLE.)       Communication   Communication Communication: No apparent difficulties    Cognition Arousal: Alert Behavior During Therapy: WFL for tasks assessed/performed   PT - Cognitive impairments: No apparent impairments                         Following commands: Intact       Cueing Cueing Techniques: Verbal cues     General Comments General comments (skin integrity, edema, etc.): Complains of BIL feet cramping for past 2  months. Asks why nobody has addressed this. Awaiting to be seen by neurologist.    Exercises     Assessment/Plan    PT Assessment Patient needs continued PT services  PT Problem List Decreased strength;Decreased range of motion;Decreased activity tolerance;Decreased balance;Decreased mobility;Decreased coordination;Decreased knowledge of use of DME;Impaired sensation;Impaired tone;Pain       PT Treatment Interventions DME instruction;Gait training;Stair training;Functional mobility training;Therapeutic activities;Therapeutic exercise;Balance training;Neuromuscular re-education;Patient/family education    PT Goals (Current goals can be found in the Care Plan section)  Acute Rehab PT Goals Patient Stated Goal: Go to inpatient rehab PT Goal Formulation: With patient Time For Goal Achievement: 09/01/23 Potential to Achieve Goals: Good    Frequency Min 3X/week     Co-evaluation               AM-PAC PT "6 Clicks" Mobility  Outcome Measure Help needed turning from your back to your side while in a flat bed without using bedrails?: A Little Help needed moving from lying on your back to sitting on the side of a flat bed without using bedrails?: A Little Help needed moving to and from a bed to a chair (including a wheelchair)?: A Little Help needed standing up from a chair using your arms (e.g., wheelchair or bedside chair)?: A Little Help needed to walk in hospital room?: A  Little Help needed climbing 3-5 steps with a railing? : A Lot 6 Click Score: 17    End of Session Equipment Utilized During Treatment: Gait belt Activity Tolerance: Patient tolerated treatment well Patient left: in bed;with call bell/phone within reach;with bed alarm set Nurse Communication: Mobility status PT Visit Diagnosis: Unsteadiness on feet (R26.81);Pain;Hemiplegia and hemiparesis;Other abnormalities of gait and mobility (R26.89) Hemiplegia - Right/Left: Right Hemiplegia - dominant/non-dominant:  Dominant Pain - Right/Left:  (bil) Pain - part of body:  (toes and back)    Time: 1610-9604 PT Time Calculation (min) (ACUTE ONLY): 27 min   Charges:   PT Evaluation $PT Eval Low Complexity: 1 Low PT Treatments $Therapeutic Activity: 8-22 mins PT General Charges $$ ACUTE PT VISIT: 1 Visit         Jory Ng, PT, DPT [facility]  Rehabilitation Services Physical Therapist Office: [phone] Website: [email or web address]   Alinda Irani 08/18/2023, 5:47 PM

## 2023-08-18 NOTE — H&P (Addendum)
 History and Physical    [name] UEA:[id number] DOB: 08/07/1950 DOA: 08/18/2023 DOS: the patient was seen and examined on 08/18/2023 PCP: Annella Kief, NP  Patient coming from: Home via EMS.  Chief Complaint:  Chief Complaint  Patient presents with   Cerebrovascular Accident   HPI: Amy Hooper is a 73 y.o. female with medical history significant of hypertension, hyperlipidemia, diabetes mellitus type 2, depression, CKD stage IIIb, and fibromyalgiahe experiences severe cramps and spasms in her right leg, which have been worsening over time. These spasms are significant enough to wake her from sleep, and she describes her foot as curling up during these episodes.  Patient reports having a 'funny feeling' while working from home yesterday afternoon around 6 PM, followed by her right leg twisting and not responding to her brain's commands to straighten, leading to difficulty walking and requiring her to hold onto furniture for support. She has a history of back surgery in 2022, after which she experienced tingling and numbness in her right leg.  Patient had initially attributed her issues to an issue with her back.  Patient had gotten into bed due to her symptoms    Throughout the night she describes episodes where where her right leg 'flopped off the bed' when she attempted to get up to use the bathroom, and she also experienced weakness in her right arm, making it difficult to brush her teeth. She also reports a persistent 'fuzziness' in her head, feeling as if she is 'in a cloud'.  She has a family history of strokes, with both her grandmother and father having had strokes. She has a history of palpitations and has been evaluated by cardiology, with a diagnosis of preventricular contractions. She takes hydrocodone  for fibromyalgia pain due to kidney failure stage 3B, as she cannot take arthritis medications.  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 No loss  of consciousness or falls noted related to her symptoms. No chest pain. Sensation is intact.  Patient presented as a code stroke.  In the ED patient was noted to be the afebrile with blood pressures elevated up to 170/87, and all other vital signs maintained.  Labs noted creatinine 1.25, but otherwise were unremarkable. 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  Neurology had been consulted.  Patient was not a thrombolytics candidate and also deemed not a candidate for endovascular intervention.  Patient had been started on dual antiplatelet therapy.  TRH consulted to admit for completion of stroke workup.  Review of Systems: As mentioned in the history of present illness. All other systems reviewed and are negative. Past Medical History:  Diagnosis Date   Allergy 1976   Tramadol  NSAIDS   Chronic kidney disease    Depression    Fibromyalgia    Gout    HTN (hypertension)    Hyperlipidemia 09/01/22   Insomnia    Morbid obesity (HCC)    Non-recurrent acute suppurative otitis media of left ear without spontaneous rupture of tympanic membrane 04/30/2022   Osteoarthritis    Palpitation    Sleep apnea    Past Surgical History:  Procedure Laterality Date   APPLICATION OF WOUND VAC N/A 03/25/2021   Procedure: APPLICATION OF WOUND VAC;  Surgeon: Adah Acron, MD;  Location: [facility] OR;  Service: Orthopedics;  Laterality: N/A;   APPLICATION OF WOUND VAC N/A 03/30/2021   Procedure: WOUND VAC CHANGE 12x6x5;  Surgeon: Adah Acron, MD;  Location: [facility] OR;  Service: Orthopedics;  Laterality: N/A;   INCISION AND DRAINAGE OF WOUND N/A 03/25/2021   Procedure: LUMBAR POST OP INCISION IRRIGATION;  Surgeon: Adah Acron, MD;  Location: [facility] OR;  Service: Orthopedics;  Laterality: N/A;   JOINT REPLACEMENT     KNEE ARTHROSCOPY     LUMBAR WOUND  DEBRIDEMENT N/A 03/30/2021   Procedure: REPEAT LUMBAR WOUND DEBRIDEMENT;  Surgeon: Adah Acron, MD;  Location: [facility] OR;  Service: Orthopedics;  Laterality: N/A;   right rotator cuff     ROTATOR CUFF REPAIR Left    TOTAL KNEE ARTHROPLASTY Left 09/08/2020   Procedure: LEFT TOTAL KNEE ARTHROPLASTY;  Surgeon: Wes Hamman, MD;  Location: [facility] OR;  Service: Orthopedics;  Laterality: Left;   TUBAL LIGATION     Social History:  reports that she has never smoked. She has been exposed to tobacco smoke. She has never used smokeless tobacco. She reports that she does not currently use alcohol. She reports that she does not use drugs.  Allergies  Allergen Reactions   Cefuroxime Hives and Rash    Other reaction(s): Unknown  Other Reaction(s): Unknown   Cefuroxime Axetil Rash and Hives   Clarithromycin Hives and Rash    Other reaction(s): Unknown  Other Reaction(s): Unknown   Nsaids Nausea And Vomiting    Other reaction(s): Unknown  Other Reaction(s): Unknown   Hydrocodone      Other reaction(s): Unknown  Other Reaction(s): Unknown   Hydrocodone -Acetaminophen  Other (See Comments)    Stomach upset  Other Reaction(s): Other  Stomach upset    Stomach upset,  Stomach upset    Stomach upset,     Stomach upset  Stomach upset,   Other Other (See Comments) and Nausea And Vomiting    Upset stomach  Other Reaction(s): Other (See Comments), Unknown  Upset stomach    Upset stomach Other reaction(s): Unknown   Oxycodone -Acetaminophen  Other (See Comments)    Other reaction(s): Unknown  Other Reaction(s): Other, Unknown  Hallucination   Robaxin  [Methocarbamol ] Nausea And Vomiting    "It tears my stomach up."   Toradol  [Ketorolac  Tromethamine ] Nausea And Vomiting   Tramadol  Nausea And Vomiting and Other (See Comments)    Upset stomach  Other reaction(s): Unknown  Other Reaction(s): Other, Unknown  Upset stomach    Upset stomach Other reaction(s): Unknown    Family History   Problem Relation Age of Onset   Hypothyroidism Mother    Hyperlipidemia Mother    Miscarriages / [place] Mother    Hyperlipidemia Other    Kidney failure Father    Dementia Father    Gout Father    Arthritis Father    Prostate cancer Father    Heart disease Father    Hypertension Father    Kidney disease Father    Stroke Father     Prior to Admission medications   Medication Sig Start Date End Date Taking? Authorizing Provider  allopurinol  (ZYLOPRIM ) 100 MG tablet Take 2 tablets (200 mg total) by mouth daily. 04/25/23   Early, Sara E, NP  amLODipine  (NORVASC ) 5 MG tablet Take 1 tablet (5 mg total) by mouth daily. 04/25/23   Early, Sara E, NP  atorvastatin  (LIPITOR) 10 MG tablet TAKE 1 TABLET BY MOUTH 3 TIMES  WEEKLY 06/08/23   Early, Sara E, NP  dapagliflozin  propanediol (FARXIGA ) 5 MG TABS tablet Take  1 tablet (5 mg total) by mouth daily. 05/19/23   Early, Sara E, NP  DULoxetine  (CYMBALTA ) 20 MG capsule TAKE 1 CAPSULE BY MOUTH DAILY 10/05/22   Raulkar, Keven Pel, MD  eszopiclone  (LUNESTA ) 1 MG TABS tablet Take 1 tablet (1 mg total) by mouth at bedtime as needed for sleep. Take immediately before bedtime 06/20/23   Early, Sara E, NP  fluticasone  (FLONASE ) 50 MCG/ACT nasal spray Place 1 spray into both nostrils daily as needed for allergies.    [provider]  HYDROcodone -acetaminophen  (NORCO) 7.5-325 MG tablet Take 1 tablet by mouth 3 (three) times daily as needed for moderate pain (pain score 4-6). 06/29/23   Jodi Munroe, NP  hydrocortisone  (ANUSOL -HC) 2.5 % rectal cream PLACE 1 APPLICATION RECTALLY 2 (TWO) TIMES DAILY. 02/03/22   Zilphia Hilt, Charyl Coppersmith, MD  ketoconazole  (NIZORAL ) 2 % cream Apply 1 Application topically daily. Apply to rash twice a day. If no improvement after 2 weeks, please let provider know. 07/23/22   Early, Sara E, NP  loratadine  (CLARITIN ) 10 MG tablet Take 10 mg by mouth daily as needed for allergies.    [provider]  losartan  (COZAAR ) 50  MG tablet Take 1 tablet (50 mg total) by mouth daily. 04/25/23   Early, Sara E, NP  mometasone  (ELOCON ) 0.1 % cream Apply very thin layer to rash at bedtime. May use one other time during the day if needed. Stop after 7 days, if still needed may repeat after 3 day break. 07/23/22   Early, Sara E, NP  pregabalin  (LYRICA ) 75 MG capsule Take 1 capsule (75 mg total) by mouth 2 (two) times daily. 06/29/23   Jodi Munroe, NP  Vitamin D , Ergocalciferol , (DRISDOL ) 1.25 MG (50000 UNIT) CAPS capsule Take 1 capsule (50,000 Units total) by mouth every 7 (seven) days. 06/01/23   Liam Redhead, MD    Physical Exam: Vitals:   08/18/23 1144 08/18/23 1154  BP: (!) 170/87   Pulse: 71   Resp: 12   Temp:  97.9 F (36.6 C)  TempSrc:  Oral  SpO2: 100%    Constitutional: Elderly female currently in no acute distress Eyes: PERRL, lids and conjunctivae normal ENMT: Mucous membranes are moist. Normal dentition.  Neck: normal, supple, no masses, no thyromegaly Respiratory: clear to auscultation bilaterally, no wheezing, no crackles. Normal respiratory effort. No accessory muscle use.  Cardiovascular: Regular rate and rhythm, no murmurs / rubs / gallops. No extremity edema. 2+ pedal pulses.  Abdomen: no tenderness, no masses palpated. Bowel sounds positive.  Musculoskeletal: no clubbing / cyanosis. No joint deformity upper and lower extremities. Good ROM, no contractures. Normal muscle tone.  Skin: no rashes, lesions, ulcers. No induration Neurologic: CN 2-12 grossly intact. Sensation intact, DTR normal.  Strength appears to be 2/5 in the right lower extremity and 3/5 in the right upper extremity.  Strength is 5/5 in the left upper and lower extremity.   Psychiatric: Normal judgment and insight. Alert and oriented x 3. Normal mood.   Data Reviewed:  EKG reveals sinus rhythm at 63 bpm.  Reviewed labs, imaging, and pertinent records as documented.  Assessment and Plan:  CVA Acute.  Patient presents with  complaints of right upper and lower extremity weakness starting yesterday evening.  CT angiogram of the head noted occlusion of the left ACA the A3 segment to the proximal A4 segment with reconstitution of the left A4 segment over the anterior body of the corpus callosum.  Patient was not a  candidate for thrombolytics due to being outside the window and not a candidate for endovascular intervention due to location. - Admit to a telemetry bed - Stroke order set utilized - Neurochecks - Check Hemoglobin A1c and lipid panel - Check MRI of the brain - Check echocardiogram - PT/OT/speech to evaluate and treat - Aspirin  and Plavix -Follow-up telemetry overnight.  s - Neurology consulted,  will follow-up for any further recommendations  Premature atrial complexes Records note patient previously wore a Holter monitor back in 2024 which noted frequent PACs with average heart rate of 68. - Follow-up telemetry - Would likely benefit from Holter monitor at discharge or possibly evaluation for loop recorder  Essential hypertension In the ED blood pressures noted to be elevated up to 170/87.  Blood pressure medication regimen includes amlodipine  and losartan . - Allowing part of permissive hypertension at this time - Resume home blood pressure regimen with medical appropriate  Hyperlipidemia Patient reports adverse reaction including muscle cramps due to taking atorvastatin . - Will need to be evaluated for alternative medications in outpatient setting  Chronic kidney disease stage IIIb Creatinine noted to be 1.25 which appears to be near patient baseline - Continue to monitor  Fibromyalgia Depression - Continue Cymbalta  and hydrocodone  as needed  Gout - Continue allopurinol   Insomnia - Continue Lunesta   Obesity BMI previously noted to be 37.1  DVT prophylaxis: Lovenox  Advance Care Planning:   Code Status: Full Code   Consults: Neurology  Family Communication: Family updated at  bedside Severity of Illness: The appropriate patient status for this patient is INPATIENT. Inpatient status is judged to be reasonable and necessary in order to provide the required intensity of service to ensure the patient's safety. The patient's presenting symptoms, physical exam findings, and initial radiographic and laboratory data in the context of their chronic comorbidities is felt to place them at high risk for further clinical deterioration. Furthermore, it is not anticipated that the patient will be medically stable for discharge from the hospital within 2 midnights of admission.   * I certify that at the point of admission it is my clinical judgment that the patient will require inpatient hospital care spanning beyond 2 midnights from the point of admission due to high intensity of service, high risk for further deterioration and high frequency of surveillance required.*  Author: Lena Qualia, MD 08/18/2023 2:10 PM  For on call review [email or web address].

## 2023-08-18 NOTE — Consult Note (Addendum)
 NEUROLOGY CONSULT NOTE   Date of service: Aug 18, 2023 Patient Name: Amy Hooper MRN:  [id number] DOB:  14-Nov-1950 Chief Complaint: "Right-sided weakness" Requesting Provider: Lena Qualia, MD  History of Present Illness  Amy Hooper is a 73 y.o. female with hx of HTN, HLD, sleep apnea and CKD who presented to the ED with weakness. Neurology was consulted in the setting of right-sided weakness.  On initial assessment, patient reports that her last known well was 2 PM yesterday.  Patient was working at home at her desk.  At 3 PM she started to have a funny feeling," almost like aura".  She continued to work in an tried to get up from her desk at 6 PM noticed her right foot dragging. Patient later went to lay down and got up from bed around 11 PM and noticed her right lower extremity weakness was worsening and she was dragging her leg now while walking to the bathroom.  She went back to sleep and later the next morning noticed persistent right lower extremity weakness and new onset of right upper extremity weakness.  She called her sister at ~9 AM due to concerning symptoms.   Patient reports that her head feels weird, and brain feels like cotton.  Patient denies slurred speech and word finding difficulty. Denies double vision or blurry vision.  Patient reports history of cataracts and attributes some cloudiness to that. She denies any numbness, tingling or pain currently.  Sporadically has muscle spasms in her legs, which has been an ongoing issue.    LKW: 1430 5/28 At baseline, patient able to perform ADLs  and iADLs  IV Thrombolysis: No (reason), outside of window from symptom onset EVT: No, distal thrombus unamenable to intervention  NIHSS components Score: Comment  1a Level of Conscious 0[x]  1[]  2[]  3[]      1b LOC Questions 0[x]  1[]  2[]       1c LOC Commands 0[x]  1[]  2[]       2 Best Gaze 0[x]  1[]  2[]       3 Visual 0[x]  1[]  2[]  3[]      4 Facial Palsy 0[x]  1[]  2[]  3[]       5a Motor Arm - left 0[x]  1[]  2[]  3[]  4[]  UN[]    5b Motor Arm - Right 0[]  1[]  2[x]  3[]  4[]  UN[]    6a Motor Leg - Left 0[x]  1[]  2[]  3[]  4[]  UN[]    6b Motor Leg - Right 0[]  1[]  2[]  3[]  4[x]  UN[]    7 Limb Ataxia 0[]  1[]  2[]  UN[x]      8 Sensory 0[x]  1[]  2[]  UN[]      9 Best Language 0[x]  1[]  2[]  3[]      10 Dysarthria 0[x]  1[]  2[]  UN[]      11 Extinct. and Inattention 0[x]  1[]  2[]       TOTAL:6       ROS  Comprehensive ROS performed and pertinent positives documented in HPI   Past History   Past Medical History:  Diagnosis Date   Allergy 1976   Tramadol  NSAIDS   Chronic kidney disease    Depression    Fibromyalgia    Gout    HTN (hypertension)    Hyperlipidemia 09/01/22   Insomnia    Morbid obesity (HCC)    Non-recurrent acute suppurative otitis media of left ear without spontaneous rupture of tympanic membrane 04/30/2022   Osteoarthritis    Palpitation    Sleep apnea     Past Surgical History:  Procedure Laterality Date   APPLICATION OF WOUND VAC  N/A 03/25/2021   Procedure: APPLICATION OF WOUND VAC;  Surgeon: Adah Acron, MD;  Location: [facility] OR;  Service: Orthopedics;  Laterality: N/A;   APPLICATION OF WOUND VAC N/A 03/30/2021   Procedure: WOUND VAC CHANGE 12x6x5;  Surgeon: Adah Acron, MD;  Location: [facility] OR;  Service: Orthopedics;  Laterality: N/A;   INCISION AND DRAINAGE OF WOUND N/A 03/25/2021   Procedure: LUMBAR POST OP INCISION IRRIGATION;  Surgeon: Adah Acron, MD;  Location: [facility] OR;  Service: Orthopedics;  Laterality: N/A;   JOINT REPLACEMENT     KNEE ARTHROSCOPY     LUMBAR WOUND DEBRIDEMENT N/A 03/30/2021   Procedure: REPEAT LUMBAR WOUND DEBRIDEMENT;  Surgeon: Adah Acron, MD;  Location: [facility] OR;  Service: Orthopedics;  Laterality: N/A;   right rotator cuff     ROTATOR CUFF REPAIR Left    TOTAL KNEE ARTHROPLASTY Left 09/08/2020   Procedure: LEFT TOTAL KNEE ARTHROPLASTY;  Surgeon: Wes Hamman, MD;  Location: [facility] OR;  Service: Orthopedics;  Laterality: Left;   TUBAL  LIGATION      Family History: Family History  Problem Relation Age of Onset   Hypothyroidism Mother    Hyperlipidemia Mother    Miscarriages / [place] Mother    Hyperlipidemia Other    Kidney failure Father    Dementia Father    Gout Father    Arthritis Father    Prostate cancer Father    Heart disease Father    Hypertension Father    Kidney disease Father    Stroke Father     Social History  reports that she has never smoked. She has been exposed to tobacco smoke. She has never used smokeless tobacco. She reports that she does not currently use alcohol. She reports that she does not use drugs.  Allergies  Allergen Reactions   Cefuroxime Hives and Rash    Other reaction(s): Unknown  Other Reaction(s): Unknown   Cefuroxime Axetil Rash and Hives   Clarithromycin Hives and Rash    Other reaction(s): Unknown  Other Reaction(s): Unknown   Nsaids Nausea And Vomiting    Other reaction(s): Unknown  Other Reaction(s): Unknown   Hydrocodone      Other reaction(s): Unknown  Other Reaction(s): Unknown   Hydrocodone -Acetaminophen  Other (See Comments)    Stomach upset  Other Reaction(s): Other  Stomach upset    Stomach upset,  Stomach upset    Stomach upset,     Stomach upset  Stomach upset,   Other Other (See Comments) and Nausea And Vomiting    Upset stomach  Other Reaction(s): Other (See Comments), Unknown  Upset stomach    Upset stomach Other reaction(s): Unknown   Oxycodone -Acetaminophen  Other (See Comments)    Other reaction(s): Unknown  Other Reaction(s): Other, Unknown  Hallucination   Robaxin  [Methocarbamol ] Nausea And Vomiting    "It tears my stomach up."   Toradol  [Ketorolac  Tromethamine ] Nausea And Vomiting   Tramadol  Nausea And Vomiting and Other (See Comments)    Upset stomach  Other reaction(s): Unknown  Other Reaction(s): Other, Unknown  Upset stomach    Upset stomach Other reaction(s): Unknown    Medications   Current  Facility-Administered Medications:    [START ON 08/19/2023]  stroke: early stages of recovery book, , Does not apply, Once, Smith, Rondell A, MD   0.9 %  sodium chloride  infusion, , Intravenous, Continuous, Smith, Rondell A, MD, Last Rate: 40 mL/hr at 08/18/23 1447, New Bag at 08/18/23 1447   acetaminophen  (TYLENOL ) tablet 650 mg, 650 mg,  Oral, Q4H PRN **OR** acetaminophen  (TYLENOL ) 160 MG/5ML solution 650 mg, 650 mg, Per Tube, Q4H PRN **OR** acetaminophen  (TYLENOL ) suppository 650 mg, 650 mg, Rectal, Q4H PRN, Smith, Rondell A, MD   enoxaparin (LOVENOX) injection 40 mg, 40 mg, Subcutaneous, Q24H, Smith, Rondell A, MD   senna-docusate (Senokot-S) tablet 1 tablet, 1 tablet, Oral, QHS PRN, Lena Qualia, MD  Vitals   Vitals:   09/15/2023 1144 2023/09/15 1154 2023/09/15 1438 Sep 15, 2023 1449  BP: (!) 170/87   (!) 165/76  Pulse: 71  65 68  Resp: 12  14 16   Temp:  97.9 F (36.6 C)  97.8 F (36.6 C)  TempSrc:  Oral  Oral  SpO2: 100%  100%     There is no height or weight on file to calculate BMI.  Physical Exam   Constitutional: Appears well-developed and well-nourished.  Psych: Affect appropriate to situation.  Eyes: No scleral injection.  HENT: No OP obstruction.  Head: Normocephalic.  Cardiovascular: Normal rate and regular rhythm per telemetry. Respiratory: Effort normal, non-labored breathing.  GI: Soft.  No distension. There is no tenderness.  Skin: WDI.  Neurologic Examination   Mental Status: Patient is awake, alert, oriented to person, place, month, year, and situation. Patient is able to give a clear and coherent history. No signs of aphasia or neglect Cranial Nerves: II: Visual Fields are full. Pupils are equal, round, and reactive to light.  III,IV, VI: EOMI without ptosis or diploplia.  V: Facial sensation is symmetric to temperature VII: Facial movement is symmetric.  VIII: hearing is intact to voice X: Uvula elevates symmetrically XI: Shoulder shrug is symmetric. XII:  tongue is midline without atrophy or fasciculations.  Motor: Tone is normal. Bulk is normal. 3/5 strength and 0/5 strength in RUE and RLE (noxious to painful stimuli) 5/5 strength in LUE and LLE.  Sensory: Sensation is symmetric to light touch in the arms and legs. Cerebellar: FNF intact bilaterally Gait:  Deferred in acute setting    Labs/Imaging/Neurodiagnostic studies   CBC:  Recent Labs  Lab 09/15/2023 1204 2023/09/15 1213  WBC  --  4.9  NEUTROABS  --  2.5  HGB 13.6 13.5  HCT 40.0 40.4  MCV  --  88.8  PLT  --  322   Basic Metabolic Panel:  Lab Results  Component Value Date   NA 138 September 15, 2023   K 4.3 September 15, 2023   CO2 25 Sep 15, 2023   GLUCOSE 88 September 15, 2023   BUN 12 09/15/2023   CREATININE 1.25 (H) Sep 15, 2023   CALCIUM  9.7 15-Sep-2023   GFRNONAA 46 (L) 09/15/23   GFRAA 56 (L) 02/05/2020   Lipid Panel:  Lab Results  Component Value Date   LDLCALC 155 (H) 04/25/2023   HgbA1c:  Lab Results  Component Value Date   HGBA1C 5.2 04/25/2023   Urine Drug Screen:     Component Value Date/Time   LABOPIA POSITIVE (A) 09-15-23 1300   COCAINSCRNUR NONE DETECTED 09/15/2023 1300   LABBENZ NONE DETECTED 2023/09/15 1300   AMPHETMU NONE DETECTED 2023/09/15 1300   THCU NONE DETECTED 15-Sep-2023 1300   LABBARB NONE DETECTED September 15, 2023 1300    Alcohol Level     Component Value Date/Time   ETH <15 09-15-2023 1213   INR  Lab Results  Component Value Date   INR 1.0 09/15/2023   APTT  Lab Results  Component Value Date   APTT 28 09-15-2023   AED levels: No results found for: "PHENYTOIN", "ZONISAMIDE", "LAMOTRIGINE", "LEVETIRACETA"  CT angio Head and Neck with  contrast(Personally reviewed): 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 There is reconstitution of the left A4 segment over the anterior body of the corpus callosum with diminutive caliber.   Intracranial arterial vasculature is otherwise patent.   Mild atherosclerosis as  above.  No high-grade stenosis.   No acute intracranial hemorrhage.   Aortic Atherosclerosis (ICD10-I70.0).  MRI Brain pending   ASSESSMENT   Elexa Cozetta Seide is a 73 y.o. female with past medical history of hypertension, hyperlipidemia sleep apnea and CKD who presented with right-sided weakness. Suspect stroke given CTA results with notable occlusion of the left ACA. MRI  ordered for further characterization.   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 - Follow-up HgbA1c, fasting lipid panel - MRI brain pending -Versed  ordered to help with tolerability of MRI - Frequent neuro checks - Echocardiogram - Prophylactic therapy Plavix 300 mg load, followed by ASA 81 mg daily and Plavix 75 mg daily for 3 weeks then Plavix alone afterwards - Risk factor modification - Telemetry monitoring; can consider  30 day event monitor on discharge if no arrythmias captured  - Defer management to stroke team  - Blood pressure goal   - Permissive hypertension to 220/120 due to acute stroke - PT consult, OT consult, Speech consult, unless patient is back to baseline - Stroke team to follow _____________________________________________________________________  Signed, Brayton Calin, MD Amy Hooper psychiatry - PGY 1  I have seen the patient reviewed the above note.  She has a distal ACA occlusion, and will need secondary stroke workup as outlined above.  MRI could be helpful for determining etiology if there are other infarcts seen.  There is not a great deal of atherosclerosis, so embolus is certainly a consideration.  She has a history of GI upset with aspirin , so following her 3 weeks of DAPT I would continue with Plavix monotherapy  She has also had cramps, and not tolerated statins and therefore may need to consider a PCSK9 inhibitor if LDL is elevated.  Stroke team to follow.  Ann Keto, MD [facility]   If 7pm- 7am, please page neurology on call as  listed in [facility]ION.

## 2023-08-18 NOTE — ED Provider Notes (Signed)
 [place] EMERGENCY DEPARTMENT AT [facility] Provider Note  CSN: [id number] Arrival date & time: 08/18/23 1139  Chief Complaint(s) Cerebrovascular Accident  HPI Amy Hooper is a 73 y.o. female history of hypertension, hyperlipidemia presenting to the emergency department with weakness.  Patient reports that yesterday she sat at her desk around 230.  She got up around 6 and noticed that her right leg was weak.  She went to bed.  She woke up this morning and felt was worse, also having right arm weakness.  No facial droop, trouble swallowing, trouble with coordination, numbness or tingling, nausea or vomiting, chest pain, back pain, abdominal pain.   Past Medical History Past Medical History:  Diagnosis Date   Allergy 1976   Tramadol  NSAIDS   Chronic kidney disease    Depression    Fibromyalgia    Gout    HTN (hypertension)    Hyperlipidemia 09/01/22   Insomnia    Morbid obesity (HCC)    Non-recurrent acute suppurative otitis media of left ear without spontaneous rupture of tympanic membrane 04/30/2022   Osteoarthritis    Palpitation    Sleep apnea    Patient Active Problem List   Diagnosis Date Noted   CVA (cerebrovascular accident) (HCC) 08/18/2023   Nocturnal leg cramps 07/27/2023   PAC (premature atrial contraction) 12/20/2022   Forearm tendonitis 07/23/2022   Rash and nonspecific skin eruption 07/23/2022   Asymptomatic bradycardia 07/23/2022   Rheumatoid arthritis with rheumatoid factor of multiple sites without organ or systems involvement (HCC) 04/30/2022   Osteopenia 03/03/2022   Degenerative spondylolisthesis 02/19/2021   Status post total left knee replacement 09/08/2020   OSA (obstructive sleep apnea) 05/19/2020   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/17/2020   Chronic pain syndrome 03/03/2020   S/P lumbar fusion 02/05/2020   Vitamin D  deficiency 01/09/2020   Hyperlipidemia 01/09/2020   CKD (chronic kidney disease) stage 3,  GFR 30-59 ml/min (HCC) 01/09/2020   HTN (hypertension)    Morbid obesity (HCC)    Gout    Depression    Insomnia    Chronically low serum potassium 04/22/2014   Environmental and seasonal allergies 04/22/2014   Palpitations 04/22/2014   Home Medication(s) Prior to Admission medications   Medication Sig Start Date End Date Taking? Authorizing Provider  allopurinol  (ZYLOPRIM ) 100 MG tablet Take 2 tablets (200 mg total) by mouth daily. 04/25/23   Early, Sara E, NP  amLODipine  (NORVASC ) 5 MG tablet Take 1 tablet (5 mg total) by mouth daily. 04/25/23   Early, Sara E, NP  atorvastatin  (LIPITOR) 10 MG tablet TAKE 1 TABLET BY MOUTH 3 TIMES  WEEKLY 06/08/23   Early, Sara E, NP  dapagliflozin  propanediol (FARXIGA ) 5 MG TABS tablet Take 1 tablet (5 mg total) by mouth daily. 05/19/23   Early, Sara E, NP  DULoxetine  (CYMBALTA ) 20 MG capsule TAKE 1 CAPSULE BY MOUTH DAILY 10/05/22   Raulkar, Keven Pel, MD  eszopiclone  (LUNESTA ) 1 MG TABS tablet Take 1 tablet (1 mg total) by mouth at bedtime as needed for sleep. Take immediately before bedtime 06/20/23   Early, Sara E, NP  fluticasone  (FLONASE ) 50 MCG/ACT nasal spray Place 1 spray into both nostrils daily as needed for allergies.    [provider]  HYDROcodone -acetaminophen  (NORCO) 7.5-325 MG tablet Take 1 tablet by mouth 3 (three) times daily as needed for moderate pain (pain score 4-6). 06/29/23   Jodi Munroe, NP  hydrocortisone  (ANUSOL -HC) 2.5 % rectal cream PLACE 1 APPLICATION RECTALLY 2 (  TWO) TIMES DAILY. 02/03/22   Zilphia Hilt, Charyl Coppersmith, MD  ketoconazole  (NIZORAL ) 2 % cream Apply 1 Application topically daily. Apply to rash twice a day. If no improvement after 2 weeks, please let provider know. 07/23/22   Early, Sara E, NP  loratadine  (CLARITIN ) 10 MG tablet Take 10 mg by mouth daily as needed for allergies.    [provider]  losartan  (COZAAR ) 50 MG tablet Take 1 tablet (50 mg total) by mouth daily. 04/25/23   Early, Sara E, NP   mometasone  (ELOCON ) 0.1 % cream Apply very thin layer to rash at bedtime. May use one other time during the day if needed. Stop after 7 days, if still needed may repeat after 3 day break. 07/23/22   Early, Sara E, NP  pregabalin  (LYRICA ) 75 MG capsule Take 1 capsule (75 mg total) by mouth 2 (two) times daily. 06/29/23   Jodi Munroe, NP  Vitamin D , Ergocalciferol , (DRISDOL ) 1.25 MG (50000 UNIT) CAPS capsule Take 1 capsule (50,000 Units total) by mouth every 7 (seven) days. 06/01/23   Raulkar, Keven Pel, MD                                                                                                                                    Past Surgical History Past Surgical History:  Procedure Laterality Date   APPLICATION OF WOUND VAC N/A 03/25/2021   Procedure: APPLICATION OF WOUND VAC;  Surgeon: Adah Acron, MD;  Location: [facility] OR;  Service: Orthopedics;  Laterality: N/A;   APPLICATION OF WOUND VAC N/A 03/30/2021   Procedure: WOUND VAC CHANGE 12x6x5;  Surgeon: Adah Acron, MD;  Location: [facility] OR;  Service: Orthopedics;  Laterality: N/A;   INCISION AND DRAINAGE OF WOUND N/A 03/25/2021   Procedure: LUMBAR POST OP INCISION IRRIGATION;  Surgeon: Adah Acron, MD;  Location: [facility] OR;  Service: Orthopedics;  Laterality: N/A;   JOINT REPLACEMENT     KNEE ARTHROSCOPY     LUMBAR WOUND DEBRIDEMENT N/A 03/30/2021   Procedure: REPEAT LUMBAR WOUND DEBRIDEMENT;  Surgeon: Adah Acron, MD;  Location: [facility] OR;  Service: Orthopedics;  Laterality: N/A;   right rotator cuff     ROTATOR CUFF REPAIR Left    TOTAL KNEE ARTHROPLASTY Left 09/08/2020   Procedure: LEFT TOTAL KNEE ARTHROPLASTY;  Surgeon: Wes Hamman, MD;  Location: [facility] OR;  Service: Orthopedics;  Laterality: Left;   TUBAL LIGATION     Family History Family History  Problem Relation Age of Onset   Hypothyroidism Mother    Hyperlipidemia Mother    Miscarriages / [place] Mother    Hyperlipidemia Other    Kidney failure Father    Dementia Father    Gout  Father    Arthritis Father    Prostate cancer Father    Heart disease Father    Hypertension Father    Kidney disease Father    Stroke Father  Social History Social History   Tobacco Use   Smoking status: Never    Passive exposure: Current   Smokeless tobacco: Never   Tobacco comments:    I don't smoke  Vaping Use   Vaping status: Never Used  Substance Use Topics   Alcohol use: Not Currently    Comment: No alcohol since I've been taking opioids   Drug use: Never   Allergies Cefuroxime, Cefuroxime axetil, Clarithromycin, Nsaids, Hydrocodone , Hydrocodone -acetaminophen , Other, Oxycodone -acetaminophen , Robaxin  [methocarbamol ], Toradol  [ketorolac  tromethamine ], and Tramadol   Review of Systems Review of Systems  All other systems reviewed and are negative.   Physical Exam Vital Signs  I have reviewed the triage vital signs BP (!) 170/87 (BP Location: Right Arm)   Pulse 71   Temp 97.9 F (36.6 C) (Oral)   Resp 12   SpO2 100%  Physical Exam Vitals and nursing note reviewed.  Constitutional:      General: She is not in acute distress.    Appearance: She is well-developed.  HENT:     Head: Normocephalic and atraumatic.     Mouth/Throat:     Mouth: Mucous membranes are moist.  Eyes:     Pupils: Pupils are equal, round, and reactive to light.  Cardiovascular:     Rate and Rhythm: Normal rate and regular rhythm.     Heart sounds: No murmur heard. Pulmonary:     Effort: Pulmonary effort is normal. No respiratory distress.     Breath sounds: Normal breath sounds.  Abdominal:     General: Abdomen is flat.     Palpations: Abdomen is soft.     Tenderness: There is no abdominal tenderness.  Musculoskeletal:        General: No tenderness.     Right lower leg: No edema.     Left lower leg: No edema.  Skin:    General: Skin is warm and dry.  Neurological:     Mental Status: She is alert.     Comments: Cranial nerves II through XII intact.  No visual field deficit.  4/5 strength in the right upper extremity, 2 out of 5 strength in the right lower extremity.  Left lower extremity and left upper extremity 5 out of 5 strength.  No sensory deficit to light touch.  No dysmetria no extinction  Psychiatric:        Mood and Affect: Mood normal.        Behavior: Behavior normal.     ED Results and Treatments Labs (all labs ordered are listed, but only abnormal results are displayed) Labs Reviewed  COMPREHENSIVE METABOLIC PANEL WITH GFR - Abnormal; Notable for the following components:      Result Value   Creatinine, Ser 1.25 (*)    GFR, Estimated 46 (*)    All other components within normal limits  I-STAT CHEM 8, ED - Abnormal; Notable for the following components:   Creatinine, Ser 1.40 (*)    All other components within normal limits  ETHANOL  PROTIME-INR  APTT  CBC  DIFFERENTIAL  RAPID URINE DRUG SCREEN, HOSP PERFORMED  HIV ANTIBODY (ROUTINE TESTING W REFLEX)  LIPID PANEL  HEMOGLOBIN A1C  CBG MONITORING, ED  Radiology CT ANGIO HEAD NECK W WO CM Addendum Date: 08/18/2023 ADDENDUM REPORT: 08/18/2023 13:52 ADDENDUM: These results were called by telephone at the time of interpretation on 08/18/2023 at 1:52 pm to provider Hiawatha Lout , who verbally acknowledged these results. Electronically Signed   By: Denny Flack M.D.   On: 08/18/2023 13:52   Result Date: 08/18/2023 CLINICAL DATA:  Neuro deficit, concern for stroke. Weakness and right leg and arm with tingling in right leg. Gait abnormality due to weakness. EXAM: CT ANGIOGRAPHY HEAD AND NECK WITH AND WITHOUT CONTRAST TECHNIQUE: Multidetector CT imaging of the head and neck was performed using the standard protocol during bolus administration of intravenous contrast. Multiplanar CT image reconstructions and MIPs were obtained to evaluate the vascular anatomy. Carotid stenosis  measurements (when applicable) are obtained utilizing NASCET criteria, using the distal internal carotid diameter as the denominator. RADIATION DOSE REDUCTION: This exam was performed according to the departmental dose-optimization program which includes automated exposure control, adjustment of the mA and/or kV according to patient size and/or use of iterative reconstruction technique. CONTRAST:  75mL OMNIPAQUE IOHEXOL 350 MG/ML SOLN COMPARISON:  None Available. FINDINGS: CT HEAD FINDINGS Brain: No acute intracranial hemorrhage. No CT evidence of acute infarct. No edema, mass effect, or midline shift. The basilar cisterns are patent. Ventricles: Ventricles are normal in size and configuration. Vascular: No hyperdense vessel. Intracranial atherosclerotic calcifications noted. Skull: No acute or aggressive finding. Sinuses/orbits: Mild mucosal thickening in the ethmoid sinuses. Other: Mastoid air cells are clear. CTA NECK FINDINGS Aortic arch: Common origin of the brachiocephalic and left common carotid arteries. Imaged portion shows no evidence of aneurysm or dissection. Mild atherosclerosis. No significant stenosis of the major arch vessel origins. Pulmonary arteries: As permitted by contrast timing, there are no filling defects in the visualized pulmonary arteries. Subclavian arteries: The subclavian arteries are patent bilaterally. Right carotid system: Patent. Mild atherosclerosis at the carotid bifurcation without hemodynamically significant stenosis. No evidence of dissection. Left carotid system: Patent. Mild atherosclerosis at the carotid bifurcation without hemodynamically significant stenosis. No evidence of dissection. Vertebral arteries: Codominant. No evidence of dissection, stenosis (50% or greater), or occlusion. Mild tortuosity of the V1 and V2 segments. Skeleton: No acute findings. Degenerative changes in the cervical spine. Other neck: The visualized airway is patent. No cervical lymphadenopathy.  Upper chest: Visualized lung apices are clear. Review of the MIP images confirms the above findings CTA HEAD FINDINGS ANTERIOR CIRCULATION: The intracranial ICAs are patent bilaterally. Mild atherosclerosis of the carotid siphons. No significant stenosis, proximal occlusion, aneurysm, or vascular malformation. MCAs: The middle cerebral arteries are patent bilaterally. ACAs: The right ACA is patent. The A1 and A2 segments are patent on the left. There is occlusion of the A3 and proximal A4 segments of the left ACA with reconstitution of the left A4 segment over the anterior body of the corpus callosum. POSTERIOR CIRCULATION: No significant stenosis, proximal occlusion, aneurysm, or vascular malformation. PCAs: The posterior cerebral arteries are patent bilaterally. Pcomm: The posterior communicating arteries are visualized bilaterally. SCAs: The superior cerebellar arteries are patent bilaterally. Basilar artery: Patent AICAs: Patent PICAs: Patent Vertebral arteries: The intracranial vertebral arteries are patent. Venous sinuses: As permitted by contrast timing, patent. 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 There is reconstitution of the left A4 segment over the anterior body of the corpus callosum with diminutive caliber. Intracranial arterial vasculature is otherwise  patent. Mild atherosclerosis as above.  No high-grade stenosis. No acute intracranial hemorrhage. Aortic Atherosclerosis (ICD10-I70.0). Electronically Signed: By: Denny Flack M.D. On: 08/18/2023 13:46    Pertinent labs & imaging results that were available during my care of the patient were reviewed by me and considered in my medical decision making (see MDM for details).  Medications Ordered in ED Medications  aspirin  chewable tablet 81 mg (has no administration in time range)  clopidogrel  (PLAVIX ) tablet 300 mg (has  no administration in time range)   stroke: early stages of recovery book (has no administration in time range)  0.9 %  sodium chloride  infusion (has no administration in time range)  acetaminophen  (TYLENOL ) tablet 650 mg (has no administration in time range)    Or  acetaminophen  (TYLENOL ) 160 MG/5ML solution 650 mg (has no administration in time range)    Or  acetaminophen  (TYLENOL ) suppository 650 mg (has no administration in time range)  senna-docusate (Senokot-S) tablet 1 tablet (has no administration in time range)  enoxaparin  (LOVENOX ) injection 40 mg (has no administration in time range)  iohexol  (OMNIPAQUE ) 350 MG/ML injection 75 mL (75 mLs Intravenous Contrast Given 08/18/23 1247)                                                                                                                                     Procedures .Critical Care  Performed by: Mordecai Applebaum, MD Authorized by: Mordecai Applebaum, MD   Critical care provider statement:    Critical care time (minutes):  30   Critical care was necessary to treat or prevent imminent or life-threatening deterioration of the following conditions:  CNS failure or compromise   Critical care was time spent personally by me on the following activities:  Development of treatment plan with patient or surrogate, discussions with consultants, evaluation of patient's response to treatment, examination of patient, ordering and review of laboratory studies, ordering and review of radiographic studies, ordering and performing treatments and interventions, pulse oximetry, re-evaluation of patient's condition and review of old charts   Care discussed with: admitting provider     (including critical care time)  Medical Decision Making / ED Course   MDM:  73 year old presenting to the emergency department with weakness.  Patient well-appearing overall, no acute distress.  Does have right arm and leg weakness, leg greater than  arm.  Symptoms concerning for stroke.  Considered code stroke activation however symptoms present for greater than 4 and half hours, candidate for TNK although this was considered.  Also considered LVO, however patient did not meet LVO criteria.  Perform CT angiography of the head and neck, which was notable for left A3 occlusion which is consistent with the patient's symptoms.  It is not an intervenable target.  Discussed with neurology, Dr. Alecia Ames who will consult.  Discussed with Dr. Felipe Horton, who will admit.      Additional history obtained: -Additional history  obtained from ems -External records from outside source obtained and reviewed including: Chart review including previous notes, labs, imaging, consultation notes including prior notes    Lab Tests: -I ordered, reviewed, and interpreted labs.   The pertinent results include:   Labs Reviewed  COMPREHENSIVE METABOLIC PANEL WITH GFR - Abnormal; Notable for the following components:      Result Value   Creatinine, Ser 1.25 (*)    GFR, Estimated 46 (*)    All other components within normal limits  I-STAT CHEM 8, ED - Abnormal; Notable for the following components:   Creatinine, Ser 1.40 (*)    All other components within normal limits  ETHANOL  PROTIME-INR  APTT  CBC  DIFFERENTIAL  RAPID URINE DRUG SCREEN, HOSP PERFORMED  HIV ANTIBODY (ROUTINE TESTING W REFLEX)  LIPID PANEL  HEMOGLOBIN A1C  CBG MONITORING, ED    Notable for mild AKI  EKG   EKG Interpretation Date/Time:  Thursday Aug 18 2023 13:39:57 EDT Ventricular Rate:  63 PR Interval:  177 QRS Duration:  82 QT Interval:  392 QTC Calculation: 402 R Axis:   67  Text Interpretation: Sinus rhythm Confirmed by Hiawatha Lout ([id number]) on 08/18/2023 1:50:26 PM         Imaging Studies ordered: I ordered imaging studies including CTA head On my interpretation imaging demonstrates prior notes  I independently visualized and interpreted imaging. I agree with  the radiologist interpretation   Medicines ordered and prescription drug management: Meds ordered this encounter  Medications   iohexol (OMNIPAQUE) 350 MG/ML injection 75 mL   aspirin  chewable tablet 81 mg   clopidogrel (PLAVIX) tablet 300 mg    stroke: early stages of recovery book   0.9 %  sodium chloride  infusion   OR Linked Order Group    acetaminophen  (TYLENOL ) tablet 650 mg    acetaminophen  (TYLENOL ) 160 MG/5ML solution 650 mg    acetaminophen  (TYLENOL ) suppository 650 mg   senna-docusate (Senokot-S) tablet 1 tablet   enoxaparin (LOVENOX) injection 40 mg    -I have reviewed the patients home medicines and have made adjustments as needed   Consultations Obtained: I requested consultation with the neurologist,  and discussed lab and imaging findings as well as pertinent plan - they recommend: admit    Cardiac Monitoring: The patient was maintained on a cardiac monitor.  I personally viewed and interpreted the cardiac monitored which showed an underlying rhythm of: NSR  Social Determinants of Health:  Diagnosis or treatment significantly limited by social determinants of health: obesity   Reevaluation: After the interventions noted above, I reevaluated the patient and found that their symptoms have stayed the same  Co morbidities that complicate the patient evaluation  Past Medical History:  Diagnosis Date   Allergy 1976   Tramadol  NSAIDS   Chronic kidney disease    Depression    Fibromyalgia    Gout    HTN (hypertension)    Hyperlipidemia 09/01/22   Insomnia    Morbid obesity (HCC)    Non-recurrent acute suppurative otitis media of left ear without spontaneous rupture of tympanic membrane 04/30/2022   Osteoarthritis    Palpitation    Sleep apnea       Dispostion: Disposition decision including need for hospitalization was considered, and patient admitted to the hospital.    Final Clinical Impression(s) / ED Diagnoses Final diagnoses:  Acute ischemic  left ACA stroke (HCC)     This chart was dictated using voice recognition software.  Despite best efforts  to proofread,  errors can occur which can change the documentation meaning.    Mordecai Applebaum, MD 08/18/23 [phone]

## 2023-08-19 ENCOUNTER — Inpatient Hospital Stay (HOSPITAL_COMMUNITY)

## 2023-08-19 DIAGNOSIS — I10 Essential (primary) hypertension: Secondary | ICD-10-CM | POA: Diagnosis not present

## 2023-08-19 DIAGNOSIS — M797 Fibromyalgia: Secondary | ICD-10-CM | POA: Diagnosis not present

## 2023-08-19 DIAGNOSIS — I63522 Cerebral infarction due to unspecified occlusion or stenosis of left anterior cerebral artery: Secondary | ICD-10-CM | POA: Diagnosis not present

## 2023-08-19 DIAGNOSIS — I639 Cerebral infarction, unspecified: Secondary | ICD-10-CM

## 2023-08-19 DIAGNOSIS — R002 Palpitations: Secondary | ICD-10-CM

## 2023-08-19 DIAGNOSIS — I129 Hypertensive chronic kidney disease with stage 1 through stage 4 chronic kidney disease, or unspecified chronic kidney disease: Secondary | ICD-10-CM | POA: Diagnosis not present

## 2023-08-19 DIAGNOSIS — N1831 Chronic kidney disease, stage 3a: Secondary | ICD-10-CM | POA: Diagnosis not present

## 2023-08-19 DIAGNOSIS — I6389 Other cerebral infarction: Secondary | ICD-10-CM

## 2023-08-19 DIAGNOSIS — E782 Mixed hyperlipidemia: Secondary | ICD-10-CM | POA: Diagnosis not present

## 2023-08-19 LAB — ECHOCARDIOGRAM COMPLETE
AR max vel: 3.04 cm2
AV Area VTI: 3.1 cm2
AV Area mean vel: 2.97 cm2
AV Mean grad: 5 mmHg
AV Peak grad: 9.5 mmHg
Ao pk vel: 1.54 m/s
Area-P 1/2: 3.34 cm2
Height: 62 in
MV M vel: 4.21 m/s
MV Peak grad: 70.9 mmHg
S' Lateral: 2.3 cm
Weight: 3209.9 [oz_av]

## 2023-08-19 LAB — HIV ANTIBODY (ROUTINE TESTING W REFLEX): HIV Screen 4th Generation wRfx: NONREACTIVE

## 2023-08-19 MED ORDER — ATORVASTATIN CALCIUM 80 MG PO TABS
80.0000 mg | ORAL_TABLET | Freq: Every day | ORAL | Status: DC
  Administered 2023-08-19: 80 mg via ORAL
  Filled 2023-08-19: qty 1

## 2023-08-19 MED ORDER — ASPIRIN 81 MG PO TBEC
81.0000 mg | DELAYED_RELEASE_TABLET | Freq: Every day | ORAL | Status: DC
  Administered 2023-08-19 – 2023-08-23 (×5): 81 mg via ORAL
  Filled 2023-08-19 (×5): qty 1

## 2023-08-19 MED ORDER — ROSUVASTATIN CALCIUM 20 MG PO TABS
20.0000 mg | ORAL_TABLET | Freq: Every day | ORAL | Status: DC
  Administered 2023-08-20 – 2023-08-23 (×4): 20 mg via ORAL
  Filled 2023-08-19 (×4): qty 1

## 2023-08-19 MED ORDER — CLOPIDOGREL BISULFATE 75 MG PO TABS
75.0000 mg | ORAL_TABLET | Freq: Every day | ORAL | Status: DC
  Administered 2023-08-19 – 2023-08-23 (×5): 75 mg via ORAL
  Filled 2023-08-19 (×5): qty 1

## 2023-08-19 NOTE — TOC CAGE-AID Note (Signed)
 Transition of Care [facility]) - CAGE-AID Screening   Patient Details  Name: Amy Hooper MRN: [id number] Date of Birth: [birth year]  [facility] CM/SW Contact:    Graeme Menees E Chadwin Fury, LCSW Phone Number: 08/19/2023, 9:25 AM   Clinical Narrative:    CAGE-AID Screening:    Have You Ever Felt You Ought to Cut Down on Your Drinking or Drug Use?: No Have People Annoyed You By [employer] Your Drinking Or Drug Use?: No Have You Felt Bad Or Guilty About Your Drinking Or Drug Use?: No Have You Ever Had a Drink or Used Drugs First Thing In The Morning to Steady Your Nerves or to Get Rid of a Hangover?: No CAGE-AID Score: 0  Substance Abuse Education Offered: No

## 2023-08-19 NOTE — Evaluation (Signed)
 Occupational Therapy Evaluation Patient Details Name: Amy Hooper MRN: [id number] DOB: 12/25/1950 Today's Date: 08/19/2023   History of Present Illness   73 y.o. female presenting 5/29 to the emergency department with weakness.  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 PMH  hypertension, hyperlipidemia     Clinical Impressions Pt admitted for above, PTA pt reports being ind with ADLs/iADLs and ambulating no AD. Pt currently noted to be moving RLE much better than PT eval, ambulating with CGA + RW with min cues to pickup RLE more. Her RUE has decreased FMC and needs increased effort to perform AROM but pt able to use it functionally within ADLs. She is currently completing ADLs with CGA to setup A. OT to continue following pt acutely to address deficits and help transition to next level of care. Would benefit from putty or squeezeball next therapy session. Patient has the potential to reach Mod I and demos the ability to tolerate 3 hours of therapy. Pt would benefit from an intensive rehab program to help maximize functional independence.      If plan is discharge home, recommend the following:   Assistance with cooking/housework;Assist for transportation     Functional Status Assessment   Patient has had a recent decline in their functional status and demonstrates the ability to make significant improvements in function in a reasonable and predictable amount of time.     Equipment Recommendations   Other (comment);Tub/shower seat (RW)     Recommendations for Other Services         Precautions/Restrictions   Precautions Precautions: Fall Recall of Precautions/Restrictions: Intact Restrictions Weight Bearing Restrictions Per Provider Order: No     Mobility Bed Mobility               General bed mobility comments: Pt EOB on arrival    Transfers Overall transfer  level: Needs assistance Equipment used: Rolling walker (2 wheels) Transfers: Sit to/from Stand Sit to Stand: Contact guard assist           General transfer comment: good use of hand placement to rise      Balance Overall balance assessment: Needs assistance Sitting-balance support: No upper extremity supported, Feet supported Sitting balance-Leahy Scale: Normal     Standing balance support: Single extremity supported Standing balance-Leahy Scale: Poor Standing balance comment: RW for support                           ADL either performed or assessed with clinical judgement   ADL Overall ADL's : Needs assistance/impaired Eating/Feeding: Independent;Sitting   Grooming: Standing;Supervision/safety   Upper Body Bathing: Sitting;Set up   Lower Body Bathing: Sit to/from stand;Contact guard assist   Upper Body Dressing : Sitting;Set up   Lower Body Dressing: Sit to/from stand;Contact guard assist   Toilet Transfer: Contact guard assist;Rolling walker (2 wheels);Ambulation   Toileting- Clothing Manipulation and Hygiene: Contact guard assist;Sit to/from stand       Functional mobility during ADLs: Contact guard assist;Rolling walker (2 wheels)       Vision Baseline Vision/History: 1 Wears glasses Patient Visual Report: No change from baseline Vision Assessment?: No apparent visual deficits     Perception Perception: Within Functional Limits       Praxis Praxis: WFL       Pertinent Vitals/Pain Pain Assessment Pain Assessment: Faces Faces Pain Scale: Hurts little more Pain Location:  headache Pain Descriptors / Indicators: Headache Pain Intervention(s): Monitored during session, Patient requesting pain meds-RN notified     Extremity/Trunk Assessment Upper Extremity Assessment Upper Extremity Assessment: RUE deficits/detail RUE Deficits / Details: weak grasp, maintains grip on RW. with effort shoulder flexion AROM 120 deg and MMT 3+/5.  Biceps/triceps 4/5 MMT with effort RUE Coordination: decreased fine motor   Lower Extremity Assessment Lower Extremity Assessment: Defer to PT evaluation       Communication Communication Communication: No apparent difficulties   Cognition Arousal: Alert Behavior During Therapy: WFL for tasks assessed/performed Cognition: No apparent impairments                               Following commands: Intact       Cueing  General Comments   Cueing Techniques: Verbal cues  HR up to 113 bpm with activity   Exercises     Shoulder Instructions      Home Living   Living Arrangements: Spouse/significant other Available Help at Discharge: Family;Available 24 hours/day (Husband currently Out of Town but can Return if needed) Type of Home: House Home Access: Stairs to enter [employer] of Steps: 7 Entrance Stairs-Rails: Right;Left Home Layout: One level     Bathroom Shower/Tub: Tub/shower unit;Walk-in shower         Home Equipment: Rollator (4 wheels);Cane - single point          Prior Functioning/Environment Prior Level of Function : Independent/Modified Independent;Driving             Mobility Comments: Ind, denies falls ADLs Comments: ind    OT Problem List: Decreased strength;Decreased coordination;Impaired balance (sitting and/or standing)   OT Treatment/Interventions: Self-care/ADL training;Therapeutic exercise;Patient/family education;Balance training;Therapeutic activities;DME and/or AE instruction      OT Goals(Current goals can be found in the care plan section)   Acute Rehab OT Goals Patient Stated Goal: To get better,stronger OT Goal Formulation: With patient Time For Goal Achievement: 09/02/23 Potential to Achieve Goals: Good ADL Goals Pt Will Perform Grooming: with modified independence;standing Pt Will Perform Lower Body Bathing: with modified independence;sit to/from stand Pt Will Perform Lower Body Dressing: with  modified independence;sit to/from stand Pt Will Transfer to Toilet: with modified independence;ambulating Pt Will Perform Tub/Shower Transfer: Tub transfer;with modified independence Additional ADL Goal #1: Pt will demonstrate independence with FMC HEP for RUE   OT Frequency:  Min 2X/week    Co-evaluation              AM-PAC OT "6 Clicks" Daily Activity     Outcome Measure Help from another person eating meals?: None Help from another person taking care of personal grooming?: A Little Help from another person toileting, which includes using toliet, bedpan, or urinal?: A Little Help from another person bathing (including washing, rinsing, drying)?: A Little Help from another person to put on and taking off regular upper body clothing?: A Little Help from another person to put on and taking off regular lower body clothing?: A Little 6 Click Score: 19   End of Session Equipment Utilized During Treatment: Gait belt;Rolling walker (2 wheels) Nurse Communication: Mobility status  Activity Tolerance: Patient tolerated treatment well Patient left:    OT Visit Diagnosis: Unsteadiness on feet (R26.81);Other abnormalities of gait and mobility (R26.89);Muscle weakness (generalized) (M62.81)                Time: 1610-9604 OT Time Calculation (min): 52 min Charges:  OT General  Charges $OT Visit: 1 Visit OT Evaluation $OT Eval Moderate Complexity: 1 Mod OT Treatments $Self Care/Home Management : 8-22 mins $Therapeutic Activity: 8-22 mins  08/19/2023  AB, OTR/L  Acute Rehabilitation Services  Office: [phone]   Jorene New 08/19/2023, 11:06 AM

## 2023-08-19 NOTE — Progress Notes (Signed)
 [facility]                                                                               Amy Hooper, is a 73 y.o. female, DOB - [birth year], NGE:[id number] Admit date - 08/18/2023    Outpatient Primary MD for the patient is Early, [name], NP  LOS - 1  days    Brief summary   Amy Hooper is a 73 y.o. female with medical history significant of hypertension, hyperlipidemia, diabetes mellitus type 2, depression, CKD stage IIIb, and fibromyalgia reports worsening spasms of the right lower extremity , thought it was probably her sciatica pain.  Additionally she reports that while walking her RLE was dragging, for atleast a day prior to admission. Patient presented as a code stroke.  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  Neurology had been consulted.  Patient was not a thrombolytics candidate and also deemed not a candidate for endovascular intervention.  Patient had been started on dual antiplatelet therapy.  [facility] consulted to admit for completion of stroke workup.    Assessment & Plan    Assessment and Plan:  Acute CVA MRI brain shows Small region of acute ischemia within the posterior paramedian left frontal lobe.  Further stroke work up in progress.  Echocardiogram is pending.  Continue with aspirin  and plavix and crestor.  LDL is 136 , A1c is 5 Lower extremity duplex is pending.  Neurology on board.  Therapy eval are pending.  CIR on board.    Type 2 DM Continue with SSI.  A1c is 5   Hypertension Well controlled.  Allow for permissive hypertension.    Hyperlipidemia LDL is 136, continue with statin.    Stage 3 b CKD Creatinine at baseline.    Fibromyalgia Resume home meds.   Body mass index is 36.69 kg/m. Obesity.    Gout  Continue with allopurinol .     Estimated body mass index is 36.69 kg/m as calculated from the following:   Height as of this encounter: 5\' 2"  (1.575 m).   Weight as of this encounter: 91 kg.  Code Status: full code.  DVT Prophylaxis:  enoxaparin (LOVENOX) injection 40 mg Start: 08/18/23 1600   Level of Care: Level of care: Telemetry Medical Family Communication: none at bedside.   Disposition Plan:     Remains inpatient appropriate:  pending CIR.   Procedures:  echo  Consultants:   Neurology.   Antimicrobials:   Anti-infectives (From admission, onward)    None        Medications  Scheduled Meds:  allopurinol   200 mg Oral Daily   aspirin  EC  81 mg Oral Daily   clopidogrel  75 mg Oral Daily   dapagliflozin  propanediol  5 mg Oral Daily   DULoxetine   20 mg Oral QPM   enoxaparin (LOVENOX) injection  40 mg Subcutaneous Q24H   pregabalin   75 mg Oral BID   [START ON 08/20/2023] rosuvastatin  20 mg Oral Daily  zolpidem  5 mg Oral QHS   Continuous Infusions: PRN Meds:.acetaminophen  **OR** acetaminophen  (TYLENOL ) oral liquid 160 mg/5 mL **OR** acetaminophen , fluticasone , HYDROcodone -acetaminophen , loratadine , senna-docusate    Subjective:   [name] was seen and examined today.  She reports her leg weakness has improved compared to yesterday, has a headache.   Objective:   Vitals:   08/19/23 0411 08/19/23 0644 08/19/23 0857 08/19/23 1121  BP: 117/64  117/63 128/70  Pulse: (!) 54  70 70  Resp: 18  18 18   Temp: 98.2 F (36.8 C)  98.7 F (37.1 C) 98.2 F (36.8 C)  TempSrc: Oral  Oral Oral  SpO2: 97%  97% 100%  Weight:  91 kg    Height:        Intake/Output Summary (Last 24 hours) at 08/19/2023 1442 Last data filed at 08/19/2023 0817 Gross per 24 hour  Intake 1226.84 ml  Output 500 ml  Net 726.84 ml   Filed Weights   08/19/23 0644  Weight: 91 kg     Exam General exam: Appears calm and comfortable  Cardiovascular system: S1 & S2 heard, RRR.  Central nervous system:  Alert and oriented. Improving weakness of the RLE weakness.  Psychiatry: Judgement and insight appear normal. Mood & affect appropriate.    Data Reviewed:  I have personally reviewed following labs and imaging studies   CBC Lab Results  Component Value Date   WBC 4.9 08/18/2023   RBC 4.55 08/18/2023   HGB 13.5 08/18/2023   HCT 40.4 08/18/2023   MCV 88.8 08/18/2023   MCH 29.7 08/18/2023   PLT 322 08/18/2023   MCHC 33.4 08/18/2023   RDW 13.8 08/18/2023   LYMPHSABS 1.8 08/18/2023   MONOABS 0.4 08/18/2023   EOSABS 0.2 08/18/2023   BASOSABS 0.0 08/18/2023     Last metabolic panel Lab Results  Component Value Date   NA 138 08/18/2023   K 4.3 08/18/2023   CL 104 08/18/2023   CO2 25 08/18/2023   BUN 12 08/18/2023   CREATININE 1.25 (H) 08/18/2023   GLUCOSE 88 08/18/2023   GFRNONAA 46 (L) 08/18/2023   GFRAA 56 (L) 02/05/2020   CALCIUM  9.7 08/18/2023   PROT 7.6 08/18/2023   ALBUMIN  3.7 08/18/2023   LABGLOB 2.9 07/25/2023   AGRATIO 1.6 07/23/2022   BILITOT 0.6 08/18/2023   ALKPHOS 105 08/18/2023   AST 20 08/18/2023   ALT 15 08/18/2023   ANIONGAP 9 08/18/2023    CBG (last 3)  Recent Labs    08/18/23 1151  GLUCAP 73      Coagulation Profile: Recent Labs  Lab 08/18/23 1213  INR 1.0     Radiology Studies: MR BRAIN WO CONTRAST Result Date: 08/18/2023 CLINICAL DATA:  Stroke follow-up EXAM: MRI HEAD WITHOUT CONTRAST TECHNIQUE: Multiplanar, multiecho pulse sequences of the brain and surrounding structures were obtained without intravenous contrast. COMPARISON:  None Available. FINDINGS: Brain: Small region of acute ischemia within the posterior paramedian left frontal lobe. No acute or chronic hemorrhage. There is multifocal hyperintense T2-weighted signal within the white matter. Parenchymal volume and CSF spaces are normal. The midline structures are normal. Vascular: Normal flow voids. Skull and upper cervical spine: Normal calvarium and skull base. Visualized upper  cervical spine and soft tissues are normal. Sinuses/Orbits:No paranasal sinus fluid levels or advanced mucosal thickening. No mastoid or middle ear effusion. Normal orbits. IMPRESSION: Small region of acute ischemia within the posterior paramedian left frontal lobe. No hemorrhage or mass effect. Electronically Signed   By: Ernestina Headland  Richardean Chancellor M.D.   On: 08/18/2023 23:05   CT ANGIO HEAD NECK W WO CM Addendum Date: 08/18/2023 ADDENDUM REPORT: 08/18/2023 13:52 ADDENDUM: These results were called by telephone at the time of interpretation on 08/18/2023 at 1:52 pm to provider Hiawatha Lout , who verbally acknowledged these results. Electronically Signed   By: Denny Flack M.D.   On: 08/18/2023 13:52   Result Date: 08/18/2023 CLINICAL DATA:  Neuro deficit, concern for stroke. Weakness and right leg and arm with tingling in right leg. Gait abnormality due to weakness. EXAM: CT ANGIOGRAPHY HEAD AND NECK WITH AND WITHOUT CONTRAST TECHNIQUE: Multidetector CT imaging of the head and neck was performed using the standard protocol during bolus administration of intravenous contrast. Multiplanar CT image reconstructions and MIPs were obtained to evaluate the vascular anatomy. Carotid stenosis measurements (when applicable) are obtained utilizing NASCET criteria, using the distal internal carotid diameter as the denominator. RADIATION DOSE REDUCTION: This exam was performed according to the departmental dose-optimization program which includes automated exposure control, adjustment of the mA and/or kV according to patient size and/or use of iterative reconstruction technique. CONTRAST:  75mL OMNIPAQUE IOHEXOL 350 MG/ML SOLN COMPARISON:  None Available. FINDINGS: CT HEAD FINDINGS Brain: No acute intracranial hemorrhage. No CT evidence of acute infarct. No edema, mass effect, or midline shift. The basilar cisterns are patent. Ventricles: Ventricles are normal in size and configuration. Vascular: No hyperdense vessel. Intracranial  atherosclerotic calcifications noted. Skull: No acute or aggressive finding. Sinuses/orbits: Mild mucosal thickening in the ethmoid sinuses. Other: Mastoid air cells are clear. CTA NECK FINDINGS Aortic arch: Common origin of the brachiocephalic and left common carotid arteries. Imaged portion shows no evidence of aneurysm or dissection. Mild atherosclerosis. No significant stenosis of the major arch vessel origins. Pulmonary arteries: As permitted by contrast timing, there are no filling defects in the visualized pulmonary arteries. Subclavian arteries: The subclavian arteries are patent bilaterally. Right carotid system: Patent. Mild atherosclerosis at the carotid bifurcation without hemodynamically significant stenosis. No evidence of dissection. Left carotid system: Patent. Mild atherosclerosis at the carotid bifurcation without hemodynamically significant stenosis. No evidence of dissection. Vertebral arteries: Codominant. No evidence of dissection, stenosis (50% or greater), or occlusion. Mild tortuosity of the V1 and V2 segments. Skeleton: No acute findings. Degenerative changes in the cervical spine. Other neck: The visualized airway is patent. No cervical lymphadenopathy. Upper chest: Visualized lung apices are clear. Review of the MIP images confirms the above findings CTA HEAD FINDINGS ANTERIOR CIRCULATION: The intracranial ICAs are patent bilaterally. Mild atherosclerosis of the carotid siphons. No significant stenosis, proximal occlusion, aneurysm, or vascular malformation. MCAs: The middle cerebral arteries are patent bilaterally. ACAs: The right ACA is patent. The A1 and A2 segments are patent on the left. There is occlusion of the A3 and proximal A4 segments of the left ACA with reconstitution of the left A4 segment over the anterior body of the corpus callosum. POSTERIOR CIRCULATION: No significant stenosis, proximal occlusion, aneurysm, or vascular malformation. PCAs: The posterior cerebral arteries  are patent bilaterally. Pcomm: The posterior communicating arteries are visualized bilaterally. SCAs: The superior cerebellar arteries are patent bilaterally. Basilar artery: Patent AICAs: Patent PICAs: Patent Vertebral arteries: The intracranial vertebral arteries are patent. Venous sinuses: As permitted by contrast timing, patent. 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 There is reconstitution of the left A4 segment over the anterior body of the corpus callosum  with diminutive caliber. Intracranial arterial vasculature is otherwise patent. Mild atherosclerosis as above.  No high-grade stenosis. No acute intracranial hemorrhage. Aortic Atherosclerosis (ICD10-I70.0). Electronically Signed: By: Denny Flack M.D. On: 08/18/2023 13:46       Feliciana Horn M.D. [facility] 08/19/2023, 2:42 PM  Available via Epic secure chat 7am-7pm After 7 pm, please refer to night coverage provider listed on amion.

## 2023-08-19 NOTE — Progress Notes (Addendum)
 STROKE TEAM PROGRESS NOTE   INTERIM HISTORY/SUBJECTIVE Still having weakness in the right lower extremity and mild weakness in right hand; CIR consulted.  Discussed imaging, labs, and stroke prevention. Had originally stopped taking ASA due to GERD, she is okay with resuming given stroke. She did not previously tolerate atorvastatin , PCP is trying to get insurance approval for repatha, she is willing to start crestor in the mean time   OBJECTIVE  CBC    Component Value Date/Time   WBC 4.9 08/18/2023 1213   RBC 4.55 08/18/2023 1213   HGB 13.5 08/18/2023 1213   HGB 13.0 07/25/2023 1406   HCT 40.4 08/18/2023 1213   HCT 40.0 07/25/2023 1406   PLT 322 08/18/2023 1213   PLT 329 07/25/2023 1406   MCV 88.8 08/18/2023 1213   MCV 91 07/25/2023 1406   MCH 29.7 08/18/2023 1213   MCHC 33.4 08/18/2023 1213   RDW 13.8 08/18/2023 1213   RDW 13.3 07/25/2023 1406   LYMPHSABS 1.8 08/18/2023 1213   LYMPHSABS 2.2 07/25/2023 1406   MONOABS 0.4 08/18/2023 1213   EOSABS 0.2 08/18/2023 1213   EOSABS 0.3 07/25/2023 1406   BASOSABS 0.0 08/18/2023 1213   BASOSABS 0.1 07/25/2023 1406    BMET    Component Value Date/Time   NA 138 08/18/2023 1217   NA 141 07/25/2023 1406   K 4.3 08/18/2023 1217   CL 104 08/18/2023 1217   CO2 25 08/18/2023 1217   GLUCOSE 88 08/18/2023 1217   BUN 12 08/18/2023 1217   BUN 27 07/25/2023 1406   CREATININE 1.25 (H) 08/18/2023 1217   CREATININE 1.48 (H) 06/20/2023 1536   CALCIUM  9.7 08/18/2023 1217   EGFR 36 (L) 07/25/2023 1406   GFRNONAA 46 (L) 08/18/2023 1217   GFRNONAA 49 (L) 02/05/2020 0902    IMAGING past 24 hours MR BRAIN WO CONTRAST Result Date: 08/18/2023 CLINICAL DATA:  Stroke follow-up EXAM: MRI HEAD WITHOUT CONTRAST TECHNIQUE: Multiplanar, multiecho pulse sequences of the brain and surrounding structures were obtained without intravenous contrast. COMPARISON:  None Available. FINDINGS: Brain: Small region of acute ischemia within the posterior paramedian  left frontal lobe. No acute or chronic hemorrhage. There is multifocal hyperintense T2-weighted signal within the white matter. Parenchymal volume and CSF spaces are normal. The midline structures are normal. Vascular: Normal flow voids. Skull and upper cervical spine: Normal calvarium and skull base. Visualized upper cervical spine and soft tissues are normal. Sinuses/Orbits:No paranasal sinus fluid levels or advanced mucosal thickening. No mastoid or middle ear effusion. Normal orbits. IMPRESSION: Small region of acute ischemia within the posterior paramedian left frontal lobe. No hemorrhage or mass effect. Electronically Signed   By: Juanetta Nordmann M.D.   On: 08/18/2023 23:05    Vitals:   08/19/23 0411 08/19/23 0644 08/19/23 0857 08/19/23 1121  BP: 117/64  117/63 128/70  Pulse: (!) 54  70 70  Resp: 18  18 18   Temp: 98.2 F (36.8 C)  98.7 F (37.1 C) 98.2 F (36.8 C)  TempSrc: Oral  Oral Oral  SpO2: 97%  97% 100%  Weight:  91 kg    Height:         PHYSICAL EXAM General:  Alert, well-nourished, well-developed patient in no acute distress Psych:  Mood and affect appropriate for situation CV: Regular rate and rhythm on monitor Respiratory:  Regular, unlabored respirations on room air GI: Abdomen soft and nontender   NEURO:  Mental Status: AA&Ox3, patient is able to give clear and coherent history Speech/Language: speech  is without dysarthria or aphasia.  Naming, repetition, fluency, and comprehension intact.  Cranial Nerves:  II: PERRL. Visual fields full.  III, IV, VI: EOMI. Eyelids elevate symmetrically.  V: Sensation is intact to light touch and symmetrical to face.  VII: Face is symmetrical resting and smiling VIII: hearing intact to voice. IX, X: Palate elevates symmetrically. Phonation is normal.  ZO:[id number] shrug 5/5. XII: tongue is midline without fasciculations. Motor:  LUE and LLE with full strength 3-/5 strength at the hip, trace dorsi and plantar flexion on the  left 4/5 at right shoulder, hand grasp weak  Tone: is normal and bulk is normal Sensation- Intact to light touch bilaterally. Extinction absent to light touch to DSS.  Endorses increased sensitivity in left foot Coordination: FTN and HKS intact on the left. Right sided weakness Gait- deferred  ASSESSMENT/PLAN  Ms. Amy Hooper is a 73 y.o. female with history of HTN, HLD, sleep apnea on CPAP and CKD who presented to the ED with weakness. Neurology was consulted in the setting of right-sided weakness. 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 There is reconstitution of the left A4 segment over the anterior body of the corpus callosum with diminutive caliber. Intracranial arterial vasculature is otherwise patent. Mild atherosclerosis as above.  No high-grade stenosis. MRI  Small region of acute ischemia within the posterior paramedian left frontal lobe. No hemorrhage or mass effect. 2D Echo EF 55 to 70% Venous duplex no DVT Recommend loop recorder placement prior to CIR discharge LDL 136 HgbA1c 5.0 VTE prophylaxis - lovenox  No antithrombotic prior to admission, now on aspirin  81 mg daily and clopidogrel 75 mg daily for 3 weeks and then ASA alone. Therapy recommendations:  CIR Disposition:  Pending   Heart palpitation History of heart palpitation in the past, especially at night Frequency decreased after CPAP use for OSA Had 7-day heart monitoring 1 year ago with cardiology, no A-fib found Discussed loop recorder with patient and she is in agreement Recommend loop recorder placement prior to CIR discharge  Hypertension Home meds:  Norvasc , losartan  Stable Long-term BP goal normotensive  Hyperlipidemia Home meds:  None, resumed in hospital LDL 136, goal < 70 Has not tolerated atorvastatin  in the past  due to leg pain Add rosuvastatin 20 Patient is willing to try crestor and then consider repatha if she does not tolerate it. She has spoken about this with her PCP in the past Continue statin at discharge  Other Stroke Risk Factors Obesity, Body mass index is 36.69 kg/m., BMI >/= 30 associated with increased stroke risk, recommend weight loss, diet and exercise as appropriate  OSA on CPAP Advanced age  Other Active Problems Neuropathy- lyrica  CKD Stage 3a Cr 1.25, GFR 46  Hospital day # 1  Patient seen and examined by NP/APP with MD. MD to update note as needed.   Imogene Mana, DNP, FNP-BC [facility] Pager: [phone]  ATTENDING NOTE: I reviewed above note and agree with the assessment and plan. Pt was seen and examined.   Patient still has mild right upper extremity drift and 2+/5 RLE, consistent with left ACA infarcts.  Etiology not quite clear, patient negative significant intracranial stenosis but endorsed heart palpitation in the past with 7-day heart monitoring showing no A-fib.  The heart palpitation was decreased frequency after taking CPAP  at night for OSA.  Will consider loop recorder at this time to rule out A-fib.  Continue DAPT for 3 weeks and then as room.  Add Crestor 20.  PT and OT recommend CIR.  For detailed assessment and plan, please refer to above as I have made changes wherever appropriate.   Neurology will sign off. Please call with questions. Pt will follow up with stroke clinic NP at [facility] in about 4 weeks. Thanks for the consult.  Consuelo Denmark, MD PhD Stroke Neurology 08/19/2023 6:07 PM    To contact Stroke Continuity provider, please refer to [email or web address]. After hours, contact General Neurology

## 2023-08-19 NOTE — Progress Notes (Signed)
  Inpatient Rehab Admissions Coordinator :  Per therapy recommendations, patient was screened for CIR candidacy by Ottie Glazier RN MSN.  At this time patient appears to be a potential candidate for CIR. I will place a rehab consult per protocol for full assessment. Please call me with any questions.  Ottie Glazier RN MSN Admissions Coordinator [phone]

## 2023-08-19 NOTE — Evaluation (Signed)
 Speech Language Pathology Evaluation Patient Details Name: Amy Hooper MRN: [id number] DOB: 1950/06/26 Today's Date: 08/19/2023 Time: 4098-1191 SLP Time Calculation (min) (ACUTE ONLY): 23 min  Problem List:  Patient Active Problem List   Diagnosis Date Noted   CVA (cerebrovascular accident) (HCC) 08/18/2023   Premature atrial complexes 08/18/2023   Obesity (BMI 30-39.9) 08/18/2023   Nocturnal leg cramps 07/27/2023   PAC (premature atrial contraction) 12/20/2022   Forearm tendonitis 07/23/2022   Rash and nonspecific skin eruption 07/23/2022   Asymptomatic bradycardia 07/23/2022   Rheumatoid arthritis with rheumatoid factor of multiple sites without organ or systems involvement (HCC) 04/30/2022   Osteopenia 03/03/2022   Degenerative spondylolisthesis 02/19/2021   Status post total left knee replacement 09/08/2020   OSA (obstructive sleep apnea) 05/19/2020   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/17/2020   Chronic pain syndrome 03/03/2020   S/P lumbar fusion 02/05/2020   Vitamin D  deficiency 01/09/2020   Hyperlipidemia 01/09/2020   CKD (chronic kidney disease) stage 3, GFR 30-59 ml/min (HCC) 01/09/2020   Essential hypertension    Morbid obesity (HCC)    Gout    Fibromyalgia    Depression    Insomnia    Chronically low serum potassium 04/22/2014   Environmental and seasonal allergies 04/22/2014   Palpitations 04/22/2014   Past Medical History:  Past Medical History:  Diagnosis Date   Allergy 1976   Tramadol  NSAIDS   Chronic kidney disease    Depression    Fibromyalgia    Gout    HTN (hypertension)    Hyperlipidemia 09/01/22   Insomnia    Morbid obesity (HCC)    Non-recurrent acute suppurative otitis media of left ear without spontaneous rupture of tympanic membrane 04/30/2022   Osteoarthritis    Palpitation    Sleep apnea    Past Surgical History:  Past Surgical History:  Procedure Laterality Date   APPLICATION OF WOUND VAC N/A 03/25/2021    Procedure: APPLICATION OF WOUND VAC;  Surgeon: Adah Acron, MD;  Location: [facility] OR;  Service: Orthopedics;  Laterality: N/A;   APPLICATION OF WOUND VAC N/A 03/30/2021   Procedure: WOUND VAC CHANGE 12x6x5;  Surgeon: Adah Acron, MD;  Location: [facility] OR;  Service: Orthopedics;  Laterality: N/A;   INCISION AND DRAINAGE OF WOUND N/A 03/25/2021   Procedure: LUMBAR POST OP INCISION IRRIGATION;  Surgeon: Adah Acron, MD;  Location: [facility] OR;  Service: Orthopedics;  Laterality: N/A;   JOINT REPLACEMENT     KNEE ARTHROSCOPY     LUMBAR WOUND DEBRIDEMENT N/A 03/30/2021   Procedure: REPEAT LUMBAR WOUND DEBRIDEMENT;  Surgeon: Adah Acron, MD;  Location: [facility] OR;  Service: Orthopedics;  Laterality: N/A;   right rotator cuff     ROTATOR CUFF REPAIR Left    TOTAL KNEE ARTHROPLASTY Left 09/08/2020   Procedure: LEFT TOTAL KNEE ARTHROPLASTY;  Surgeon: Wes Hamman, MD;  Location: [facility] OR;  Service: Orthopedics;  Laterality: Left;   TUBAL LIGATION     HPI:  Amy Hooper is a 73 yo female presenting to ED 5/29 with weakness. CTA shows A3 occlusion with small region of acute ischemia within the posterior paramedian L frontal lobe confirmed on MRI. PMH includes HTN, HLD   Assessment / Plan / Recommendation Clinical Impression  Pt demonstrates great awareness and participates functionally in conversation. She scored 27/30 on the SLUMS, which is considered WFL. No further SLP f/u is needed at this time, will sign off.    SLP Assessment  SLP Recommendation/Assessment: Patient does  not need any further Speech Lanaguage Pathology Services SLP Visit Diagnosis: Cognitive communication deficit (R41.841)    Recommendations for follow up therapy are one component of a multi-disciplinary discharge planning process, led by the attending physician.  Recommendations may be updated based on patient status, additional functional criteria and insurance authorization.    Follow Up Recommendations  No SLP follow up    Assistance  Recommended at Discharge  None  Functional Status Assessment Patient has not had a recent decline in their functional status  Frequency and Duration           SLP Evaluation Cognition  Overall Cognitive Status: Within Functional Limits for tasks assessed       Comprehension  Auditory Comprehension Overall Auditory Comprehension: Appears within functional limits for tasks assessed    Expression Expression Primary Mode of Expression: Verbal Verbal Expression Overall Verbal Expression: Appears within functional limits for tasks assessed Written Expression Dominant Hand: Right   Oral / Motor  Oral Motor/Sensory Function Overall Oral Motor/Sensory Function: Within functional limits Motor Speech Overall Motor Speech: Appears within functional limits for tasks assessed            [name], M.A., CCC-SLP Speech Language Pathology, Acute Rehabilitation Services  Secure Chat preferred [phone]  08/19/2023, 4:38 PM

## 2023-08-19 NOTE — Progress Notes (Signed)
 Physical Therapy Treatment Patient Details Name: Amy Hooper MRN: [id number] DOB: 10/02/1950 Today's Date: 08/19/2023   History of Present Illness 73 y.o. female presenting 5/29 to the emergency department with weakness.  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 PMH  hypertension, hyperlipidemia    PT Comments  Patient resting in recliner and eager to mobilize with therapy; requesting assist to bathroom and would like to perform self care/ADL and apply lipstick prior to walking in hallway. CGA for sit>stand and min/CGA for amb in room to bathroom. Pt performed 3/3 toilet tasks with supervision. Pt amb with CGA using RW to sink, steady during static standing for hand hygiene and to apply lipstick. Hallway ambulation continued then and pt amb ~180' with 2 standing rest breaks. Pt initially with poor Rt LE advancement, cues for Lt step first to stretch/load Rt hip flexor to facilitate swing through. Improved limb advancement and pt sustained throughout. Pt then cued for "heel-to-toe" strike pattern and able to achieve on Rt LE with "kick" through of Rt LE. Pt reported feeling like Rt leg had a weight on it that she was swinging forward. EOS pt returned to room and Echo present for test. Pt sat EOB and returned to supine with CGA for safety. Pt is making excellent progress and remains highly motivated to regain maximal level of functional independence. Pt has limited support for the next 2 weeks as husband is away. Anticipate pt could achieve Mod Independent level with intense follow up rehab >3 hours/day. Will continue to progress in acute setting as able.    If plan is discharge home, recommend the following: A little help with walking and/or transfers;A little help with bathing/dressing/bathroom;Assistance with cooking/housework;Help with stairs or ramp for entrance;Assist for transportation   Can travel by  private vehicle        Equipment Recommendations  Rolling walker (2 wheels) (defer to next venue)    Recommendations for Other Services Rehab consult     Precautions / Restrictions Precautions Precautions: Fall Recall of Precautions/Restrictions: Intact Restrictions Weight Bearing Restrictions Per Provider Order: No     Mobility  Bed Mobility Overal bed mobility: Needs Assistance Bed Mobility: Sit to Supine       Sit to supine: Contact guard assist   General bed mobility comments: OOB in recliner at start. Return to supine for Echo at EOS.    Transfers Overall transfer level: Needs assistance Equipment used: Rolling walker (2 wheels) Transfers: Sit to/from Stand Sit to Stand: Contact guard assist           General transfer comment: use of bil UE to power up    Ambulation/Gait Ambulation/Gait assistance: Min assist, Contact guard assist Gait Distance (Feet): 180 Feet Assistive device: Rolling walker (2 wheels) Gait Pattern/deviations: Step-to pattern, Decreased stride length, Decreased step length - right, Decreased dorsiflexion - right, Steppage, Ataxic, Step-through pattern Gait velocity: decr     General Gait Details: pt demo'd good carryover for safe management of RW with gait. cues to advance walker ahead more as getting close to crossbar.   Stairs             Wheelchair Mobility     Tilt Bed    Modified Rankin (Stroke Patients Only)       Balance Overall balance assessment: Needs assistance Sitting-balance support: No upper extremity supported, Feet supported Sitting balance-Leahy Scale: Normal     Standing balance support: Single  extremity supported Standing balance-Leahy Scale: Poor Standing balance comment: RW for support                            Communication Communication Communication: No apparent difficulties  Cognition Arousal: Alert Behavior During Therapy: WFL for tasks assessed/performed   PT -  Cognitive impairments: No apparent impairments                         Following commands: Intact      Cueing Cueing Techniques: Verbal cues  Exercises      General Comments        Pertinent Vitals/Pain Pain Assessment Pain Assessment: 0-10 Pain Score: 5  Pain Location: headache Pain Descriptors / Indicators: Headache Pain Intervention(s): Limited activity within patient's tolerance, Monitored during session, Repositioned    Home Living                          Prior Function            PT Goals (current goals can now be found in the care plan section) Acute Rehab PT Goals Patient Stated Goal: Go to inpatient rehab PT Goal Formulation: With patient Time For Goal Achievement: 09/01/23 Potential to Achieve Goals: Good Progress towards PT goals: Progressing toward goals    Frequency    Min 3X/week      PT Plan      Co-evaluation              AM-PAC PT "6 Clicks" Mobility   Outcome Measure  Help needed turning from your back to your side while in a flat bed without using bedrails?: A Little Help needed moving from lying on your back to sitting on the side of a flat bed without using bedrails?: A Little Help needed moving to and from a bed to a chair (including a wheelchair)?: A Little Help needed standing up from a chair using your arms (e.g., wheelchair or bedside chair)?: A Little Help needed to walk in hospital room?: A Little Help needed climbing 3-5 steps with a railing? : A Lot 6 Click Score: 17    End of Session Equipment Utilized During Treatment: Gait belt Activity Tolerance: Patient tolerated treatment well Patient left: in bed;with call bell/phone within reach;Other (comment) (Echo in room) Nurse Communication: Mobility status PT Visit Diagnosis: Unsteadiness on feet (R26.81);Pain;Hemiplegia and hemiparesis;Other abnormalities of gait and mobility (R26.89) Hemiplegia - Right/Left: Right Hemiplegia -  dominant/non-dominant: Dominant     Time: 1610-9604 PT Time Calculation (min) (ACUTE ONLY): 33 min  Charges:    $Gait Training: 8-22 mins $Therapeutic Activity: 8-22 mins PT General Charges $$ ACUTE PT VISIT: 1 Visit                     Tish Forge, DPT Acute Rehabilitation Services Office [phone]  08/19/23 3:26 PM

## 2023-08-19 NOTE — TOC CM/SW Note (Signed)
 Transition of Care [facility]) - Inpatient Brief Assessment   Patient Details  Name: Amy Hooper MRN: [id number] Date of Birth: 07/18/1950  [facility] of [facility]) CM/SW Contact:    Jonathan Neighbor, RN Phone Number: 08/19/2023, 3:44 PM   Clinical Narrative:  Plan is CIR vs SNF. TOC following.  Transition of Care Asessment: Insurance and Status: Insurance coverage has been reviewed Patient has primary care physician: Yes Home environment has been reviewed: home with spouse but spouse is out of town   Forensic psychologist: No current home services Social Drivers of Health Review: SDOH reviewed no interventions necessary Readmission risk has been reviewed: Yes Transition of care needs: transition of care needs identified, TOC will continue to follow

## 2023-08-19 NOTE — Progress Notes (Addendum)
  Inpatient Rehabilitation Admissions Coordinator   Met with patient at bedside for rehab assessment. 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 Her spouse is currently in [place] assisting with his Mom's care. Patient will have to be Mod I to return home, for does not have caregiver supports over the next 2 weeks. We discussed goals and expectations of a possible CIR admit. She  prefers CIR for rehab. I await updated PT and OT recommendations to determine what level of rehab she will need. Currently do not have a CIR bed available into first of next week. Please call me with any questions.   Jeannetta Millman, RN, MSN Rehab Admissions Coordinator [phone]    Patient CIR appropriate pending bed availability. Patient prefers CIR, not SNF. We await bed availability. Noted need for LOOP placement.  Jeannetta Millman, RN, MSN Rehab Admissions Coordinator [phone] 08/19/2023 7:57 PM

## 2023-08-19 NOTE — Progress Notes (Signed)
*  PRELIMINARY RESULTS* Echocardiogram 2D Echocardiogram has been performed.  Glenna Lango 08/19/2023, 3:34 PM

## 2023-08-20 DIAGNOSIS — I63422 Cerebral infarction due to embolism of left anterior cerebral artery: Secondary | ICD-10-CM | POA: Diagnosis not present

## 2023-08-20 DIAGNOSIS — I491 Atrial premature depolarization: Secondary | ICD-10-CM | POA: Diagnosis not present

## 2023-08-20 DIAGNOSIS — I10 Essential (primary) hypertension: Secondary | ICD-10-CM | POA: Diagnosis not present

## 2023-08-20 DIAGNOSIS — E782 Mixed hyperlipidemia: Secondary | ICD-10-CM | POA: Diagnosis not present

## 2023-08-20 MED ORDER — LOSARTAN POTASSIUM 50 MG PO TABS
50.0000 mg | ORAL_TABLET | Freq: Every day | ORAL | Status: DC
  Administered 2023-08-20 – 2023-08-23 (×4): 50 mg via ORAL
  Filled 2023-08-20 (×4): qty 1

## 2023-08-20 MED ORDER — CAPSAICIN-CLEANSING GEL 8 % EX KIT: 1.0000 | PACK | Freq: Once | CUTANEOUS | Status: DC

## 2023-08-20 MED ORDER — AMLODIPINE BESYLATE 5 MG PO TABS
5.0000 mg | ORAL_TABLET | Freq: Every day | ORAL | Status: DC
  Administered 2023-08-20 – 2023-08-21 (×2): 5 mg via ORAL
  Filled 2023-08-20 (×2): qty 1

## 2023-08-20 NOTE — Progress Notes (Signed)
 [facility]                                                                               Amy Hooper, is a 73 y.o. female, DOB - 1951/02/07, ZOX:[id number] Admit date - 08/18/2023    Outpatient Primary MD for the patient is Early, [name], NP  LOS - 2  days    Brief summary   Via [name] is a 73 y.o. female with medical history significant of hypertension, hyperlipidemia, diabetes mellitus type 2, depression, CKD stage IIIb, and fibromyalgia reports worsening spasms of the right lower extremity , thought it was probably her sciatica pain.  Additionally she reports that while walking her RLE was dragging, for atleast a day prior to admission. Patient presented as a code stroke.  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  Neurology had been consulted.  Patient was not a thrombolytics candidate and also deemed not a candidate for endovascular intervention.  Patient had been started on dual antiplatelet therapy.  [facility] consulted to admit for completion of stroke workup.    Assessment & Plan    Assessment and Plan:  Acute CVA MRI brain shows Small region of acute ischemia within the posterior paramedian left frontal lobe.  Further stroke work up in progress.  Echocardiogram shows LVEF of 65 to 70%, no regional wall motion ab. Grade 1 diastolic dysfunction. Left atrial size is mod dilated.  Continue with aspirin  and plavix  and crestor .  LDL is 136 , A1c is 5 Lower extremity duplex is negative for DVT.   Neurology on board.  Therapy evals done.  CIR on board.  Plan for loop recorder prior to discharge.  EP team aware.    Type 2 DM Continue with SSI.  A1c is 5 CBG (last 3)  Recent Labs    08/18/23 1151  GLUCAP 73      Hypertension Well controlled.  Restarted her on home meds.      Hyperlipidemia LDL is 136, continue with statin.    Stage 3 b CKD Creatinine at baseline.    Fibromyalgia Resume home meds.   Body mass index is 36.69 kg/m. Obesity.    Gout  Continue with allopurinol .    Headache Tylenol  and oxy prn.    Estimated body mass index is 36.69 kg/m as calculated from the following:   Height as of this encounter: 5\' 2"  (1.575 m).   Weight as of this encounter: 91 kg.  Code Status: full code.  DVT Prophylaxis:  enoxaparin  (LOVENOX ) injection 40 mg Start: 08/18/23 1600   Level of Care: Level of care: Telemetry Medical Family Communication: none at bedside.   Disposition Plan:     Remains inpatient appropriate:  pending CIR.   Procedures:  echo  Consultants:   Neurology.   Antimicrobials:   Anti-infectives (From admission, onward)    None        Medications  Scheduled Meds:  allopurinol   200 mg Oral Daily   amLODipine   5  mg Oral Daily   aspirin  EC  81 mg Oral Daily   capsaicin  topical system  1 patch Topical Once   clopidogrel   75 mg Oral Daily   dapagliflozin  propanediol  5 mg Oral Daily   DULoxetine   20 mg Oral QPM   enoxaparin  (LOVENOX ) injection  40 mg Subcutaneous Q24H   losartan   50 mg Oral Daily   pregabalin   75 mg Oral BID   rosuvastatin   20 mg Oral Daily   zolpidem   5 mg Oral QHS   Continuous Infusions: PRN Meds:.acetaminophen  **OR** acetaminophen  (TYLENOL ) oral liquid 160 mg/5 mL **OR** acetaminophen , fluticasone , HYDROcodone -acetaminophen , loratadine , senna-docusate    Subjective:   [name] was seen and examined today.  Slight headache. No new complaints. Looking forward to going home.   Objective:   Vitals:   08/19/23 2348 08/20/23 0316 08/20/23 0839 08/20/23 1114  BP: 111/64 (!) 108/58 (!) 151/72 (!) 144/67  Pulse: 62 (!) 55 82 82  Resp: 18 18 18 17   Temp: 99.3 F (37.4 C) 98 F (36.7 C) 99.1 F (37.3 C) 99.4 F (37.4 C)  TempSrc: Oral Oral Oral Oral  SpO2: 94% 97% 97% 96%   Weight:      Height:        Intake/Output Summary (Last 24 hours) at 08/20/2023 1450 Last data filed at 08/19/2023 2044 Gross per 24 hour  Intake 600 ml  Output --  Net 600 ml   Filed Weights   08/19/23 0644  Weight: 91 kg     Exam   General exam: Appears calm and comfortable  Respiratory system: Clear to auscultation. Respiratory effort normal. Cardiovascular system: S1 & S2 heard, RRR. No JVD,  Gastrointestinal system: Abdomen is nondistended, soft and nontender.  Central nervous system: Alert and oriented. Right lower extremity weakness improving.  Extremities: Symmetric 5 x 5 power. Skin: No rashes Psychiatry:  Mood & affect appropriate.     Data Reviewed:  I have personally reviewed following labs and imaging studies   CBC Lab Results  Component Value Date   WBC 4.9 08/18/2023   RBC 4.55 08/18/2023   HGB 13.5 08/18/2023   HCT 40.4 08/18/2023   MCV 88.8 08/18/2023   MCH 29.7 08/18/2023   PLT 322 08/18/2023   MCHC 33.4 08/18/2023   RDW 13.8 08/18/2023   LYMPHSABS 1.8 08/18/2023   MONOABS 0.4 08/18/2023   EOSABS 0.2 08/18/2023   BASOSABS 0.0 08/18/2023     Last metabolic panel Lab Results  Component Value Date   NA 138 08/18/2023   K 4.3 08/18/2023   CL 104 08/18/2023   CO2 25 08/18/2023   BUN 12 08/18/2023   CREATININE 1.25 (H) 08/18/2023   GLUCOSE 88 08/18/2023   GFRNONAA 46 (L) 08/18/2023   GFRAA 56 (L) 02/05/2020   CALCIUM  9.7 08/18/2023   PROT 7.6 08/18/2023   ALBUMIN  3.7 08/18/2023   LABGLOB 2.9 07/25/2023   AGRATIO 1.6 07/23/2022   BILITOT 0.6 08/18/2023   ALKPHOS 105 08/18/2023   AST 20 08/18/2023   ALT 15 08/18/2023   ANIONGAP 9 08/18/2023    CBG (last 3)  Recent Labs    08/18/23 1151  GLUCAP 73      Coagulation Profile: Recent Labs  Lab 08/18/23 1213  INR 1.0     Radiology Studies: ECHOCARDIOGRAM COMPLETE Result Date: 08/19/2023    ECHOCARDIOGRAM REPORT   Patient Name:   Amy Hooper Date of Exam:  08/19/2023 Medical Rec #:  [id number]  Height:       62.0 in Accession #:    [id number]            Weight:       200.6 lb Date of Birth:  [birth year]             BSA:          1.914 m Patient Age:    72 years              BP:           128/70 mmHg Patient Gender: F                     HR:           68 bpm. Exam Location:  Inpatient Procedure: 2D Echo, Color Doppler and Cardiac Doppler (Both Spectral and Color            Flow Doppler were utilized during procedure). Indications:    Stroke I63.9  History:        Patient has prior history of Echocardiogram examinations, most                 recent 12/27/2022. Risk Factors:Hypertension.  Sonographer:    Jeralene Mom Referring Phys: [id number] RONDELL A SMITH IMPRESSIONS  1. Left ventricular ejection fraction, by estimation, is 65 to 70%. The left ventricle has normal function. The left ventricle has no regional wall motion abnormalities. There is mild left ventricular hypertrophy. Left ventricular diastolic parameters are consistent with Grade I diastolic dysfunction (impaired relaxation).  2. Right ventricular systolic function is normal. The right ventricular size is normal. There is normal pulmonary artery systolic pressure.  3. Left atrial size was moderately dilated.  4. The mitral valve is normal in structure. Trivial mitral valve regurgitation. No evidence of mitral stenosis.  5. The aortic valve is tricuspid. Aortic valve regurgitation is not visualized. No aortic stenosis is present.  6. The inferior vena cava is normal in size with greater than 50% respiratory variability, suggesting right atrial pressure of 3 mmHg. FINDINGS  Left Ventricle: Left ventricular ejection fraction, by estimation, is 65 to 70%. The left ventricle has normal function. The left ventricle has no regional wall motion abnormalities. The left ventricular internal cavity size was normal in size. There is  mild left ventricular hypertrophy. Left ventricular diastolic parameters are  consistent with Grade I diastolic dysfunction (impaired relaxation). Right Ventricle: The right ventricular size is normal. No increase in right ventricular wall thickness. Right ventricular systolic function is normal. There is normal pulmonary artery systolic pressure. The tricuspid regurgitant velocity is 2.01 m/s, and  with an assumed right atrial pressure of 3 mmHg, the estimated right ventricular systolic pressure is 19.2 mmHg. Left Atrium: Left atrial size was moderately dilated. Right Atrium: Right atrial size was normal in size. Pericardium: There is no evidence of pericardial effusion. Mitral Valve: The mitral valve is normal in structure. Trivial mitral valve regurgitation. No evidence of mitral valve stenosis. Tricuspid Valve: The tricuspid valve is normal in structure. Tricuspid valve regurgitation is not demonstrated. No evidence of tricuspid stenosis. Aortic Valve: The aortic valve is tricuspid. Aortic valve regurgitation is not visualized. No aortic stenosis is present. Aortic valve mean gradient measures 5.0 mmHg. Aortic valve peak gradient measures 9.5 mmHg. Aortic valve area, by VTI measures 3.10 cm. Pulmonic Valve: The pulmonic valve was normal in structure. Pulmonic valve regurgitation is not visualized. No evidence of pulmonic stenosis. Aorta: The aortic root  is normal in size and structure. Venous: The inferior vena cava is normal in size with greater than 50% respiratory variability, suggesting right atrial pressure of 3 mmHg. IAS/Shunts: No atrial level shunt detected by color flow Doppler.  LEFT VENTRICLE PLAX 2D LVIDd:         4.00 cm   Diastology LVIDs:         2.30 cm   LV e' medial:    6.96 cm/s LV PW:         0.90 cm   LV E/e' medial:  10.8 LV IVS:        0.90 cm   LV e' lateral:   7.51 cm/s LVOT diam:     2.00 cm   LV E/e' lateral: 10.0 LV SV:         103 LV SV Index:   54 LVOT Area:     3.14 cm  RIGHT VENTRICLE RV Basal diam:  3.60 cm RV Mid diam:    3.00 cm RV S prime:     18.20  cm/s TAPSE (M-mode): 2.6 cm LEFT ATRIUM             Index        RIGHT ATRIUM           Index LA diam:        3.80 cm 1.99 cm/m   RA Area:     15.30 cm LA Vol (A2C):   84.4 ml 44.09 ml/m  RA Volume:   36.70 ml  19.17 ml/m LA Vol (A4C):   74.6 ml 38.97 ml/m LA Biplane Vol: 84.3 ml 44.04 ml/m  AORTIC VALVE AV Area (Vmax):    3.04 cm AV Area (Vmean):   2.97 cm AV Area (VTI):     3.10 cm AV Vmax:           154.00 cm/s AV Vmean:          107.000 cm/s AV VTI:            0.332 m AV Peak Grad:      9.5 mmHg AV Mean Grad:      5.0 mmHg LVOT Vmax:         149.00 cm/s LVOT Vmean:        101.000 cm/s LVOT VTI:          0.328 m LVOT/AV VTI ratio: 0.99  AORTA Ao Root diam: 2.60 cm Ao Asc diam:  2.70 cm MITRAL VALVE                TRICUSPID VALVE MV Area (PHT): 3.34 cm     TR Peak grad:   16.2 mmHg MV Decel Time: 227 msec     TR Vmax:        201.00 cm/s MR Peak grad: 70.9 mmHg MR Vmax:      421.00 cm/s   SHUNTS MV E velocity: 75.20 cm/s   Systemic VTI:  0.33 m MV A velocity: 107.00 cm/s  Systemic Diam: 2.00 cm MV E/A ratio:  0.70 Arta Lark Electronically signed by Arta Lark Signature Date/Time: 08/19/2023/5:31:34 PM    Final    VAS US  LOWER EXTREMITY VENOUS (DVT) Result Date: 08/19/2023  Lower Venous DVT Study Patient Name:  MARGERET STACHNIK  Date of Exam:   08/19/2023 Medical Rec #: [id number]              Accession #:    [id number] Date of Birth: Dec 06, 1950  Patient Gender: F Patient Age:   19 years Exam Location:  [facility] Procedure:      VAS US  LOWER EXTREMITY VENOUS (DVT) Referring Phys: Fraser Jackson XU --------------------------------------------------------------------------------  Indications: Embolic stroke.  Performing Technologist: Franky Ivanoff Sturdivant-Jones RDMS, RVT  Examination Guidelines: A complete evaluation includes B-mode imaging, spectral Doppler, color Doppler, and power Doppler as needed of all accessible portions of each vessel. Bilateral testing is considered an  integral part of a complete examination. Limited examinations for reoccurring indications may be performed as noted. The reflux portion of the exam is performed with the patient in reverse Trendelenburg.  +---------+---------------+---------+-----------+----------+--------------+ RIGHT    CompressibilityPhasicitySpontaneityPropertiesThrombus Aging +---------+---------------+---------+-----------+----------+--------------+ CFV      Full           Yes      Yes                                 +---------+---------------+---------+-----------+----------+--------------+ SFJ      Full                                                        +---------+---------------+---------+-----------+----------+--------------+ FV Prox  Full                                                        +---------+---------------+---------+-----------+----------+--------------+ FV Mid   Full                                                        +---------+---------------+---------+-----------+----------+--------------+ FV DistalFull                                                        +---------+---------------+---------+-----------+----------+--------------+ PFV      Full                                                        +---------+---------------+---------+-----------+----------+--------------+ POP      Full           Yes      Yes                                 +---------+---------------+---------+-----------+----------+--------------+ PTV      Full                                                        +---------+---------------+---------+-----------+----------+--------------+ PERO  Full                                                        +---------+---------------+---------+-----------+----------+--------------+   +---------+---------------+---------+-----------+----------+--------------+ LEFT     CompressibilityPhasicitySpontaneityPropertiesThrombus  Aging +---------+---------------+---------+-----------+----------+--------------+ CFV      Full           Yes      Yes                                 +---------+---------------+---------+-----------+----------+--------------+ SFJ      Full                                                        +---------+---------------+---------+-----------+----------+--------------+ FV Prox  Full                                                        +---------+---------------+---------+-----------+----------+--------------+ FV Mid   Full                                                        +---------+---------------+---------+-----------+----------+--------------+ FV DistalFull                                                        +---------+---------------+---------+-----------+----------+--------------+ PFV      Full                                                        +---------+---------------+---------+-----------+----------+--------------+ POP      Full           Yes      Yes                                 +---------+---------------+---------+-----------+----------+--------------+ PTV      Full                                                        +---------+---------------+---------+-----------+----------+--------------+ PERO     Full                                                        +---------+---------------+---------+-----------+----------+--------------+  Summary: BILATERAL: - No evidence of deep vein thrombosis seen in the lower extremities, bilaterally. -No evidence of popliteal cyst, bilaterally.   *See table(s) above for measurements and observations. Electronically signed by Jimmye Moulds MD on 08/19/2023 at 5:09:13 PM.    Final    MR BRAIN WO CONTRAST Result Date: 08/18/2023 CLINICAL DATA:  Stroke follow-up EXAM: MRI HEAD WITHOUT CONTRAST TECHNIQUE: Multiplanar, multiecho pulse sequences of the brain and surrounding structures  were obtained without intravenous contrast. COMPARISON:  None Available. FINDINGS: Brain: Small region of acute ischemia within the posterior paramedian left frontal lobe. No acute or chronic hemorrhage. There is multifocal hyperintense T2-weighted signal within the white matter. Parenchymal volume and CSF spaces are normal. The midline structures are normal. Vascular: Normal flow voids. Skull and upper cervical spine: Normal calvarium and skull base. Visualized upper cervical spine and soft tissues are normal. Sinuses/Orbits:No paranasal sinus fluid levels or advanced mucosal thickening. No mastoid or middle ear effusion. Normal orbits. IMPRESSION: Small region of acute ischemia within the posterior paramedian left frontal lobe. No hemorrhage or mass effect. Electronically Signed   By: Juanetta Nordmann M.D.   On: 08/18/2023 23:05       Feliciana Horn M.D. [facility] 08/20/2023, 2:50 PM  Available via Epic secure chat 7am-7pm After 7 pm, please refer to night coverage provider listed on amion.

## 2023-08-20 NOTE — Progress Notes (Signed)
 Pt has no iv access. MD aware.   Oral Billings, RN

## 2023-08-20 NOTE — Plan of Care (Signed)

## 2023-08-20 NOTE — Progress Notes (Signed)
 Physical Therapy Treatment Patient Details Name: Amy Hooper MRN: [id number] DOB: October 04, 1950 Today's Date: 08/20/2023   History of Present Illness 73 y.o. female presenting 5/29 to the emergency department with weakness.  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 PMH  hypertension, hyperlipidemia    PT Comments  Pt is progressing towards goals. Prior to hospitalization pt was very ind with all activities. Currently pt is CGA to Min A with sit to stand and gait. Pt scored 7/24 on dynamic gait index demonstrating a significant increase in risk for falls without an AD; prior to hospitalization pt was ind without an AD. Due to pt current functional status, home set up and available assistance at home recommending skilled physical therapy services > 3 hours/day in order to address strength, balance and functional mobility to decrease risk for falls, injury, immobility, skin break down and re-hospitalization.      If plan is discharge home, recommend the following: A little help with walking and/or transfers;Help with stairs or ramp for entrance;Assist for transportation;Assistance with cooking/housework     Equipment Recommendations  Rolling walker (2 wheels);BSC/3in1       Precautions / Restrictions Precautions Precautions: Fall Recall of Precautions/Restrictions: Intact Restrictions Weight Bearing Restrictions Per Provider Order: No     Mobility  Bed Mobility   General bed mobility comments: Pt in recliner at arrival and departure.    Transfers Overall transfer level: Needs assistance Equipment used: Rolling walker (2 wheels), None Transfers: Sit to/from Stand Sit to Stand: Contact guard assist, Min assist           General transfer comment: use of bil UE for momentum to get up; pt requires Min A for stabilization in standing without AD, CGA with AD    Ambulation/Gait Ambulation/Gait  assistance: Min assist, Contact guard assist Gait Distance (Feet): 200 Feet Assistive device: Rolling walker (2 wheels), 1 person hand held assist Gait Pattern/deviations: Step-to pattern, Decreased stride length, Decreased step length - right, Decreased dorsiflexion - right, Steppage, Ataxic, Step-through pattern Gait velocity: decreased Gait velocity interpretation: <1.8 ft/sec, indicate of risk for recurrent falls   General Gait Details: Pt CGA with RW, pt is MIn A without RW requires verbal cues for larger/higher steps for improve step length/height in order to decrease risk for falls. Uneven step length due to ataxic gait   Modified Rankin (Stroke Patients Only) Modified Rankin (Stroke Patients Only) Pre-Morbid Rankin Score: No symptoms Modified Rankin: Moderately severe disability     Balance Overall balance assessment: Needs assistance Sitting-balance support: No upper extremity supported, Feet supported Sitting balance-Leahy Scale: Good     Standing balance support: Single extremity supported, Bilateral upper extremity supported, During functional activity, Reliant on assistive device for balance Standing balance-Leahy Scale: Poor Standing balance comment: RW for support, external support required.     Standardized Balance Assessment Standardized Balance Assessment : Dynamic Gait Index   Dynamic Gait Index Level Surface: Moderate Impairment Change in Gait Speed: Moderate Impairment Gait with Horizontal Head Turns: Moderate Impairment Gait with Vertical Head Turns: Moderate Impairment Gait and Pivot Turn: Moderate Impairment Step Over Obstacle: Moderate Impairment Step Around Obstacles: Moderate Impairment Steps: Severe Impairment Total Score: 7      Communication Communication Communication: No apparent difficulties  Cognition Arousal: Alert Behavior During Therapy: WFL for tasks assessed/performed   PT - Cognitive impairments: No apparent impairments          Following commands:  Intact      Cueing Cueing Techniques: Verbal cues     General Comments General comments (skin integrity, edema, etc.): No signs/symptoms of cardiac/respiratory distress during session.      Pertinent Vitals/Pain Pain Assessment Pain Assessment: Faces Faces Pain Scale: Hurts little more Pain Location: headache Pain Descriptors / Indicators: Headache Pain Intervention(s): Monitored during session     PT Goals (current goals can now be found in the care plan section) Acute Rehab PT Goals Patient Stated Goal: Go to inpatient rehab PT Goal Formulation: With patient Time For Goal Achievement: 09/01/23 Potential to Achieve Goals: Good Progress towards PT goals: Progressing toward goals    Frequency    Min 3X/week      PT Plan  Continue with current POC        AM-PAC PT "6 Clicks" Mobility   Outcome Measure  Help needed turning from your back to your side while in a flat bed without using bedrails?: A Little Help needed moving from lying on your back to sitting on the side of a flat bed without using bedrails?: A Little Help needed moving to and from a bed to a chair (including a wheelchair)?: A Little Help needed standing up from a chair using your arms (e.g., wheelchair or bedside chair)?: A Little Help needed to walk in hospital room?: A Little Help needed climbing 3-5 steps with a railing? : A Lot 6 Click Score: 17    End of Session Equipment Utilized During Treatment: Gait belt Activity Tolerance: Patient tolerated treatment well Patient left: with call bell/phone within reach;in chair;with chair alarm set Nurse Communication: Mobility status Hemiplegia - Right/Left: Right Hemiplegia - dominant/non-dominant: Dominant Pain - Right/Left:  (bil) Pain - part of body:  (head)     Time: 1610-9604 PT Time Calculation (min) (ACUTE ONLY): 23 min  Charges:    $Therapeutic Activity: 8-22 mins $Neuromuscular Re-education: 8-22 mins PT  General Charges $$ ACUTE PT VISIT: 1 Visit                    Sloan Duncans, DPT, CLT  Acute Rehabilitation Services Office: [phone] (Secure chat preferred)    Jenice Mitts 08/20/2023, 12:46 PM

## 2023-08-20 NOTE — Plan of Care (Signed)
  Problem: Education: Goal: Knowledge of disease or condition will improve Outcome: Progressing Goal: Knowledge of secondary prevention will improve (MUST DOCUMENT ALL) Outcome: Progressing Goal: Knowledge of patient specific risk factors will improve (DELETE if not current risk factor) Outcome: Progressing   Problem: Ischemic Stroke/TIA Tissue Perfusion: Goal: Complications of ischemic stroke/TIA will be minimized Outcome: Progressing   Problem: Coping: Goal: Will verbalize positive feelings about self Outcome: Progressing Goal: Will identify appropriate support needs Outcome: Progressing   Problem: Health Behavior/Discharge Planning: Goal: Ability to manage health-related needs will improve Outcome: Progressing Goal: Goals will be collaboratively established with patient/family Outcome: Progressing   Problem: Self-Care: Goal: Ability to participate in self-care as condition permits will improve Outcome: Progressing Goal: Verbalization of feelings and concerns over difficulty with self-care will improve Outcome: Progressing Goal: Ability to communicate needs accurately will improve Outcome: Progressing   Problem: Nutrition: Goal: Risk of aspiration will decrease Outcome: Progressing Goal: Dietary intake will improve Outcome: Progressing   Problem: Education: Goal: Knowledge of General Education information will improve Description: Including pain rating scale, medication(s)/side effects and non-pharmacologic comfort measures Outcome: Progressing

## 2023-08-21 DIAGNOSIS — I491 Atrial premature depolarization: Secondary | ICD-10-CM | POA: Diagnosis not present

## 2023-08-21 DIAGNOSIS — I10 Essential (primary) hypertension: Secondary | ICD-10-CM | POA: Diagnosis not present

## 2023-08-21 DIAGNOSIS — I63422 Cerebral infarction due to embolism of left anterior cerebral artery: Secondary | ICD-10-CM | POA: Diagnosis not present

## 2023-08-21 DIAGNOSIS — E782 Mixed hyperlipidemia: Secondary | ICD-10-CM | POA: Diagnosis not present

## 2023-08-21 LAB — BASIC METABOLIC PANEL WITH GFR
Anion gap: 8 (ref 5–15)
BUN: 22 mg/dL (ref 8–23)
CO2: 24 mmol/L (ref 22–32)
Calcium: 8.6 mg/dL — ABNORMAL LOW (ref 8.9–10.3)
Chloride: 107 mmol/L (ref 98–111)
Creatinine, Ser: 1.36 mg/dL — ABNORMAL HIGH (ref 0.44–1.00)
GFR, Estimated: 42 mL/min — ABNORMAL LOW (ref >60)
Glucose, Bld: 130 mg/dL — ABNORMAL HIGH (ref 70–99)
Potassium: 4.2 mmol/L (ref 3.5–5.1)
Sodium: 139 mmol/L (ref 135–145)

## 2023-08-21 MED ORDER — AMLODIPINE BESYLATE 2.5 MG PO TABS
2.5000 mg | ORAL_TABLET | Freq: Every day | ORAL | Status: DC
  Administered 2023-08-22 – 2023-08-23 (×2): 2.5 mg via ORAL
  Filled 2023-08-21 (×2): qty 1

## 2023-08-21 MED ORDER — ORAL CARE MOUTH RINSE: 15.0000 mL | OROMUCOSAL | Status: DC | PRN

## 2023-08-21 NOTE — PMR Pre-admission (Signed)
 PMR Admission Coordinator Pre-Admission Assessment  [name] is an 73 y.o., female MRN: [id number] DOB: 1950-08-16 Height: 5\' 2"  (157.5 cm) Weight: 92.2 kg  Insurance Information HMO:     PPO:      PCP:      IPA:      80/20:      OTHER:  PRIMARY: Medicare a and b      Policy#: [id number]      Subscriber: pt Benefits:  Phone #: passport one source online     Name: 5/30 Eff. Date: a 02/19/2018 and b 04/23/2023     Deduct: $1676      Out of Pocket Max: none      Life Max: none CIR: 100%      SNF: 20 full days Outpatient: 80%     Co-Pay: 20% Home Health: 100%      Co-Pay: none DME: 80%     Co-Pay: 20% Providers: pt choice  SECONDARY: [employer] Medicare supplement      Policy#: [id number]  Financial Counselor:       Phone#:   The "Data Collection Information Summary" for patients in Inpatient Rehabilitation Facilities with attached "Privacy Act Statement-Health Care Records" was provided and verbally reviewed with: Patient  Emergency Contact Information Contact Information     Name Relation Home Work Mobile   Amy Hooper Spouse   [phone]   Amy Hooper   [phone]      Other Contacts   None on File    Current Medical History  Patient Admitting Diagnosis: CVA  History of Present Illness: 73 yo female with history of HTN, HLD, type 2 DM, depression, sleep apnea on CPAP, CKD stage IIIb and fibromyalgia. Has had significant spasms to her right leg, developed difficulty walking and weakness on her right side. Presented on 08/18/2023.  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 CTA head & neck 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 There is reconstitution of the left A4 segment over the anterior body of the corpus callosum with diminutive caliber. Intracranial arterial vasculature is otherwise patent. Mild atherosclerosis as above.  No high-grade stenosis.MRI  Small region of  acute ischemia within the posterior paramedian left frontal lobe. No hemorrhage or mass effect.  Placed on ASA and clopidogrel  for 3 weeks then ASA alone.LOOP to be placed at discharge. LDL 136. Had not tolerated atorvastatin  in the past due to leg pain. Add rosuvastatin . Willing to try Crestor  and then consider Repatha if does not tolerate. Home meds of Norvasc  and losartan  to be resumed.  Complete NIHSS TOTAL: 2  Patient's medical record from [facility] has been reviewed by the rehabilitation admission coordinator and physician.  Past Medical History  Past Medical History:  Diagnosis Date   Allergy 1976   Tramadol  NSAIDS   Chronic kidney disease    Depression    Fibromyalgia    Gout    HTN (hypertension)    Hyperlipidemia 09/01/22   Insomnia    Morbid obesity (HCC)    Non-recurrent acute suppurative otitis media of left ear without spontaneous rupture of tympanic membrane 04/30/2022   Osteoarthritis    Palpitation    Sleep apnea    Has the patient had major surgery during 100 days prior to admission? No  Family History   family history includes Arthritis in her father; Dementia in her father; Gout in her father; Heart disease in her father; Hyperlipidemia in  her mother and another family member; Hypertension in her father; Hypothyroidism in her mother; Kidney disease in her father; Kidney failure in her father; Miscarriages / Stillbirths in her mother; Prostate cancer in her father; Stroke in her father.  Current Medications  Current Facility-Administered Medications:    allopurinol  (ZYLOPRIM ) tablet 200 mg, 200 mg, Oral, Daily, Smith, Rondell A, MD, 200 mg at 08/23/23 9811   amLODipine  (NORVASC ) tablet 2.5 mg, 2.5 mg, Oral, Daily, Akula, Vijaya, MD, 2.5 mg at 08/23/23 0834   aspirin  EC tablet 81 mg, 81 mg, Oral, Daily, Consuelo Denmark, MD, 81 mg at 08/23/23 0835   butalbital-acetaminophen -caffeine (FIORICET) 50-325-40 MG per tablet 1 tablet, 1 tablet, Oral, Q6H PRN, Akula,  Vijaya, MD, 1 tablet at 08/22/23 1302   capsaicin  (ZOSTRIX) 0.025 % cream, , Topical, BID PRN, Akula, Vijaya, MD   clopidogrel  (PLAVIX ) tablet 75 mg, 75 mg, Oral, Daily, Consuelo Denmark, MD, 75 mg at 08/23/23 9147   dapagliflozin  propanediol (FARXIGA ) tablet 5 mg, 5 mg, Oral, Daily, Smith, Rondell A, MD, 5 mg at 08/23/23 8295   DULoxetine  (CYMBALTA ) DR capsule 20 mg, 20 mg, Oral, QPM, Smith, Rondell A, MD, 20 mg at 08/22/23 1725   enoxaparin  (LOVENOX ) injection 40 mg, 40 mg, Subcutaneous, Q24H, Smith, Rondell A, MD, 40 mg at 08/22/23 1725   fluticasone  (FLONASE ) 50 MCG/ACT nasal spray 1 spray, 1 spray, Each Nare, Daily PRN, Manny Sees A, MD, 1 spray at 08/22/23 0815   HYDROcodone -acetaminophen  (NORCO) 7.5-325 MG per tablet 1 tablet, 1 tablet, Oral, TID PRN, Manny Sees A, MD, 1 tablet at 08/23/23 0834   lidocaine -EPINEPHrine (XYLOCAINE  W/EPI) 1 %-1:100000 (with pres) injection, , , ,    loratadine  (CLARITIN ) tablet 10 mg, 10 mg, Oral, Daily PRN, Felipe Horton, Rondell A, MD, 10 mg at 08/23/23 6213   losartan  (COZAAR ) tablet 50 mg, 50 mg, Oral, Daily, Akula, Vijaya, MD, 50 mg at 08/23/23 0865   Oral care mouth rinse, 15 mL, Mouth Rinse, PRN, Akula, Vijaya, MD   polyethylene glycol (MIRALAX  / GLYCOLAX ) packet 17 g, 17 g, Oral, Daily, Akula, Vijaya, MD, 17 g at 08/22/23 1302   pregabalin  (LYRICA ) capsule 75 mg, 75 mg, Oral, BID, Smith, Rondell A, MD, 75 mg at 08/23/23 7846   rosuvastatin  (CRESTOR ) tablet 20 mg, 20 mg, Oral, Daily, Shafer, Devon, NP, 20 mg at 08/23/23 9629   senna-docusate (Senokot-S) tablet 2 tablet, 2 tablet, Oral, BID, Akula, Vijaya, MD, 2 tablet at 08/22/23 2023   zolpidem  (AMBIEN ) tablet 5 mg, 5 mg, Oral, QHS, Smith, Rondell A, MD, 5 mg at 08/22/23 2024  Patients Current Diet:  Diet Order             Diet - low sodium heart healthy           Diet - low sodium heart healthy           Diet Heart Fluid consistency: Thin  Diet effective now                  Precautions /  Restrictions Precautions Precautions: Fall Restrictions Weight Bearing Restrictions Per Provider Order: No   Has the patient had 2 or more falls or a fall with injury in the past year? 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 Independent  Indoor Mobility: Did the patient need assistance with  walking from room to room (with or without device)? Independent  Stairs: Did the patient need assistance with internal or external stairs (with or without device)? Independent  Functional Cognition: Did the patient need help planning regular tasks such as shopping or remembering to take medications? Independent  Patient Information Are you of Hispanic, Latino/a,or Spanish origin?: A. No, not of Hispanic, Latino/a, or Spanish origin What is your race?: B. Black or African American Do you need or want an interpreter to communicate with a doctor or health care staff?: 0. No  Patient's Response To:  Health Literacy and Transportation Is the patient able to respond to health literacy and transportation needs?: Yes Health Literacy - How often do you need to have someone help you when you read instructions, pamphlets, or other written material from your doctor or pharmacy?: Never In the past 12 months, has lack of transportation kept you from medical appointments or from getting medications?: No In the past 12 months, has lack of transportation kept you from meetings, work, or from getting things needed for daily living?: No  Home Assistive Devices / Equipment Home Equipment: Rollator (4 wheels), Cane - single point  Prior Device Use: Indicate devices/aids used by the patient prior to current illness, exacerbation or injury? None of the above  Current Functional Level Cognition  Overall Cognitive Status: Within  Functional Limits for tasks assessed Orientation Level: Oriented X4    Extremity Assessment (includes Sensation/Coordination)  Upper Extremity Assessment: RUE deficits/detail RUE Deficits / Details: grasp is improving, provided a blue foam block to work with to increase strength RUE Sensation: WNL RUE Coordination: decreased fine motor  Lower Extremity Assessment: Defer to PT evaluation RLE Deficits / Details: MMT inconsistent with functional strength observed in weight bearing: MMT - Hip flexion 1/5, knee extension 2/5 but held resistance of 4/5 in limited range, knee flexion  1/5, ankle dorsiflexion 1/5, hallux extension 0/5, ankle PF, 1/5, hip abduction 2/5. + Hoover sign. Able to raise bil heels in standing with UE support. Able to perform SLS on Rt with RW for support. RLE Sensation:  (SILT; but reports numbness throughout RLE.)    ADLs  Overall ADL's : Needs assistance/impaired Eating/Feeding: Independent, Sitting Grooming: Standing, Supervision/safety Upper Body Bathing: Sitting, Set up Lower Body Bathing: Sit to/from stand, Contact guard assist Lower Body Bathing Details (indicate cue type and reason): working on peri-care in session Upper Body Dressing : Sitting, Set up Lower Body Dressing: Sit to/from stand, Contact guard assist Toilet Transfer: Contact guard assist, Rolling walker (2 wheels), Ambulation Toileting- Clothing Manipulation and Hygiene: Contact guard assist, Sit to/from stand Functional mobility during ADLs: Contact guard assist, Rolling walker (2 wheels) General ADL Comments: Patient continues to progress towards goals. Session focus on assessment of strength in R hand and patient asking to work on peri-care in session. Patient able to complete ADLs at [facility], with compensatory strategies provided to assist with peri-care thoroughness. Patient with improved grasp in session, with [facility] activities provided, as well as blue foam block to work on overall strength.  Recommendation remains approprate; OT will continue to follow.    Mobility  Overal bed mobility: Needs Assistance Bed Mobility: Supine to Sit Supine to sit: Supervision, HOB elevated Sit to supine: Contact guard assist General bed mobility comments: increased time, HOB up and A for safety    Transfers  Overall transfer level: Needs assistance Equipment used: Rolling walker (2 wheels) Transfers: Sit to/from Stand Sit to Stand: Contact guard assist General transfer comment: CGA  with RW    Ambulation / Gait / Stairs / Wheelchair Mobility  Ambulation/Gait Ambulation/Gait assistance: Contact guard assist Gait Distance (Feet): 225 Feet Assistive device: Rolling walker (2 wheels) Gait Pattern/deviations: Step-to pattern, Decreased stride length, Decreased step length - right, Decreased dorsiflexion - right, Ataxic, Step-through pattern, Scissoring General Gait Details: R foot consistently moving medially with ambulation, increased time for turning and decreased foot clearance on R with fall risk Gait velocity: decreased Gait velocity interpretation: <1.8 ft/sec, indicate of risk for recurrent falls    Posture / Balance Balance Overall balance assessment: Needs assistance Sitting-balance support: No upper extremity supported, Feet supported Sitting balance-Leahy Scale: Good Standing balance support: Bilateral upper extremity supported Standing balance-Leahy Scale: Poor Standing balance comment: RW for support High Level Balance Comments: side stepping at rail in hallway and then crossing over in front, pt relates R knee buckling and noted difficulty with clearing R foot when L stepping over; forward tandem gait with rail and hand hold A then forward on tip toes with rail and hand hold A. Standardized Balance Assessment Standardized Balance Assessment : Dynamic Gait Index Dynamic Gait Index Level Surface: Moderate Impairment Change in Gait Speed: Moderate Impairment Gait with Horizontal  Head Turns: Moderate Impairment Gait with Vertical Head Turns: Moderate Impairment Gait and Pivot Turn: Moderate Impairment Step Over Obstacle: Moderate Impairment Step Around Obstacles: Moderate Impairment Steps: Severe Impairment Total Score: 7    Special needs/care consideration Hgb A1c 5.0 LOOP placed 6/3   Previous Home Environment  Living Arrangements: Spouse/significant other  Lives With: Spouse Available Help at Discharge: Available PRN/intermittently (spouse is in [place] for the next 2 weeks settling his Mom in from SNF) Type of Home: House Home Layout: One level Home Access: Stairs to enter Entrance Stairs-Rails: Right, Left Entrance Stairs-Number of Steps: 7 Bathroom Shower/Tub: Tub/shower unit, Health visitor: Standard Bathroom Accessibility: Yes How Accessible: Accessible via walker Home Care Services: No Additional Comments: spouse in [place] settling his Mom in at home from  SNF. 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) Contact Information: spouse Anticipated Caregiver: sister local until spouse returns Anticipated Caregiver's Contact Information: see contacts Ability/Limitations of Caregiver: spouse in [place] for 2 weeks, other family only prn Caregiver Availability: Intermittent Discharge Plan Discussed with Primary Caregiver: Yes Is Caregiver In Agreement with Plan?: Yes Does Caregiver/Family have  Issues with Lodging/Transportation while Pt is in Rehab?: No  Goals Patient/Family Goal for Rehab: Mod I with PT and OT Expected length of stay: ELOS 5- 7 days Pt/Family Agrees to Admission and willing to participate: Yes Program Orientation Provided & Reviewed with Pt/Caregiver Including Roles  & Responsibilities: Yes  Decrease burden of Care through IP rehab admission: n/a  Possible need for SNF placement upon discharge: not anticipated  Patient Condition: I have reviewed medical records from [facility], spoken with CM, and patient. I met with patient at the bedside for inpatient rehabilitation assessment.  Patient will benefit from ongoing PT and OT, can actively participate in 3 hours of therapy a day 5 days of the week,  and can make measurable gains during the admission.  Patient will also benefit from the coordinated team approach during an Inpatient Acute Rehabilitation admission.  The patient will receive intensive therapy as well as Rehabilitation physician, nursing, social worker, and care management interventions.  Due to bladder management, bowel management, safety, skin/wound care, disease management, medication administration, pain management, and patient education the patient requires 24 hour a day rehabilitation nursing.  The patient is currently CGA assist overall with mobility and basic ADLs.  Discharge setting and therapy post discharge at home with home health is anticipated.  Patient has agreed to participate in the Acute Inpatient Rehabilitation Program and will admit today.  Preadmission Screen Completed By:  Tanya Fantasia, RN MSN 08/23/2023 10:20 AM ______________________________________________________________________   Discussed status with Dr. Rachel Budds on 08/23/23 at 1020 and received approval for admission today.  Admission Coordinator:  Tanya Fantasia, RN MSN time 1020 Date 08/23/23   Assessment/Plan: Diagnosis: CVA Does the need for close, 24  hr/day Medical supervision in concert with the patient's rehab needs make it unreasonable for this patient to be served in a less intensive setting? Yes Co-Morbidities requiring supervision/potential complications: HTN, DM, OSA, CKD, fibromyalgia Due to bladder management, bowel management, safety, skin/wound care, disease management, medication administration, pain management, and patient education, does the patient require 24 hr/day rehab nursing? Yes Does the patient require coordinated care of a physician, rehab nurse, PT, OT, and SLP to address physical and functional deficits in the context of the above medical diagnosis(es)? Yes Addressing deficits in the following areas: balance, endurance, locomotion, strength, transferring, bowel/bladder control, bathing, dressing, feeding, grooming, toileting, and psychosocial support Can the patient actively participate in an intensive therapy program of at least 3 hrs of therapy 5 days a week? Yes The potential for patient to make measurable gains while on inpatient rehab is excellent Anticipated functional outcomes upon discharge from inpatient rehab: modified independent PT, modified independent OT, n/a SLP Estimated rehab length of stay to reach the above functional goals is: 5-7 days Anticipated discharge destination: Home 10. Overall Rehab/Functional Prognosis: excellent   MD Signature: Rawland Caddy, MD, [facility] Physical Medicine & Rehabilitation Medical Director Rehabilitation Services 08/23/2023

## 2023-08-21 NOTE — Progress Notes (Signed)
 [facility]                                                                               Amy Hooper, is a 73 y.o. female, DOB - April 26, 1950, OZH:[id number] Admit date - 08/18/2023    Outpatient Primary MD for the patient is Early, [name], NP  LOS - 3  days    Brief summary   Amy Hooper is a 73 y.o. female with medical history significant of hypertension, hyperlipidemia, diabetes mellitus type 2, depression, CKD stage IIIb, and fibromyalgia reports worsening spasms of the right lower extremity , thought it was probably her sciatica pain.  Additionally she reports that while walking her RLE was dragging, for atleast a day prior to admission. Patient presented as a code stroke.  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  Neurology had been consulted.  Patient was not a thrombolytics candidate and also deemed not a candidate for endovascular intervention.  Patient had been started on dual antiplatelet therapy.  [facility] consulted to admit for completion of stroke workup.    Assessment & Plan    Assessment and Plan:  Acute CVA MRI brain shows Small region of acute ischemia within the posterior paramedian left frontal lobe.  Further stroke work up in progress.  Echocardiogram shows LVEF of 65 to 70%, no regional wall motion ab. Grade 1 diastolic dysfunction. Left atrial size is mod dilated.  Continue with aspirin  and plavix  and crestor .  LDL is 136 , A1c is 5 Lower extremity duplex is negative for DVT.   Neurology on board.  Therapy evals done.  CIR on board.  Plan for loop recorder prior to discharge.  EP team aware.    Type 2 DM Continue with SSI.  A1c is 5 CBG (last 3)  Recent Labs    08/18/23 1151  GLUCAP 73      Hypertension Well controlled.  Decrease the dose of  amlodipine  2.5 mg daily and losartan  25 mg daily.    Hyperlipidemia LDL is 136, continue with statin.    Stage 3 b CKD Creatinine at baseline.  No new labs.    Fibromyalgia Resume home meds.   Body mass index is 36.69 kg/m. Obesity.  Recommend outpatient follow up with PCP   Gout  Continue with allopurinol .    Headache Tylenol  and oxy prn.    Estimated body mass index is 36.69 kg/m as calculated from the following:   Height as of this encounter: 5\' 2"  (1.575 m).   Weight as of this encounter: 91 kg.  Code Status: full code.  DVT Prophylaxis:  enoxaparin  (LOVENOX ) injection 40 mg Start: 08/18/23 1600   Level of Care: Level of care: Telemetry Medical Family Communication: none at bedside.   Disposition Plan:     Remains inpatient appropriate:  pending CIR.   Procedures:  echo  Consultants:   Neurology.   Antimicrobials:   Anti-infectives (From admission, onward)    None  Medications  Scheduled Meds:  allopurinol   200 mg Oral Daily   amLODipine   5 mg Oral Daily   aspirin  EC  81 mg Oral Daily   capsaicin  topical system  1 patch Topical Once   clopidogrel   75 mg Oral Daily   dapagliflozin  propanediol  5 mg Oral Daily   DULoxetine   20 mg Oral QPM   enoxaparin  (LOVENOX ) injection  40 mg Subcutaneous Q24H   losartan   50 mg Oral Daily   pregabalin   75 mg Oral BID   rosuvastatin   20 mg Oral Daily   zolpidem   5 mg Oral QHS   Continuous Infusions: PRN Meds:.acetaminophen  **OR** acetaminophen  (TYLENOL ) oral liquid 160 mg/5 mL **OR** acetaminophen , fluticasone , HYDROcodone -acetaminophen , loratadine , mouth rinse, senna-docusate    Subjective:   Martasia Talamante was seen and examined today.  No new complaints.   Objective:   Vitals:   08/20/23 1920 08/20/23 2356 08/21/23 0432 08/21/23 0728  BP: 132/70 112/62 (!) 99/58 (!) 111/42  Pulse: 67 76 (!) 59 (!) 52  Resp: 18 18 18 18   Temp: 97.8 F (36.6 C) 97.6 F (36.4 C) 99 F (37.2 C) 97.9  F (36.6 C)  TempSrc: Oral Oral Oral Oral  SpO2: 98% 95% 97% 98%  Weight:      Height:        Intake/Output Summary (Last 24 hours) at 08/21/2023 0944 Last data filed at 08/21/2023 0900 Gross per 24 hour  Intake 600 ml  Output --  Net 600 ml   Filed Weights   08/19/23 0644  Weight: 91 kg     Exam  General exam: Appears calm and comfortable  Respiratory system: Clear to auscultation. Respiratory effort normal. Cardiovascular system: S1 & S2 heard, RRR. No JVD,  Gastrointestinal system: Abdomen is nondistended, soft and nontender.       Data Reviewed:  I have personally reviewed following labs and imaging studies   CBC Lab Results  Component Value Date   WBC 4.9 08/18/2023   RBC 4.55 08/18/2023   HGB 13.5 08/18/2023   HCT 40.4 08/18/2023   MCV 88.8 08/18/2023   MCH 29.7 08/18/2023   PLT 322 08/18/2023   MCHC 33.4 08/18/2023   RDW 13.8 08/18/2023   LYMPHSABS 1.8 08/18/2023   MONOABS 0.4 08/18/2023   EOSABS 0.2 08/18/2023   BASOSABS 0.0 08/18/2023     Last metabolic panel Lab Results  Component Value Date   NA 139 08/21/2023   K 4.2 08/21/2023   CL 107 08/21/2023   CO2 24 08/21/2023   BUN 22 08/21/2023   CREATININE 1.36 (H) 08/21/2023   GLUCOSE 130 (H) 08/21/2023   GFRNONAA 42 (L) 08/21/2023   GFRAA 56 (L) 02/05/2020   CALCIUM  8.6 (L) 08/21/2023   PROT 7.6 08/18/2023   ALBUMIN  3.7 08/18/2023   LABGLOB 2.9 07/25/2023   AGRATIO 1.6 07/23/2022   BILITOT 0.6 08/18/2023   ALKPHOS 105 08/18/2023   AST 20 08/18/2023   ALT 15 08/18/2023   ANIONGAP 8 08/21/2023    CBG (last 3)  Recent Labs    08/18/23 1151  GLUCAP 73      Coagulation Profile: Recent Labs  Lab 08/18/23 1213  INR 1.0     Radiology Studies: ECHOCARDIOGRAM COMPLETE Result Date: 08/19/2023    ECHOCARDIOGRAM REPORT   Patient Name:   Amy Hooper Date of Exam: 08/19/2023 Medical Rec #:  [id number]             Height:       62.0  in Accession #:    [id number]             Weight:       200.6 lb Date of Birth:  08/10/1950             BSA:          1.914 m Patient Age:    72 years              BP:           128/70 mmHg Patient Gender: F                     HR:           68 bpm. Exam Location:  Inpatient Procedure: 2D Echo, Color Doppler and Cardiac Doppler (Both Spectral and Color            Flow Doppler were utilized during procedure). Indications:    Stroke I63.9  History:        Patient has prior history of Echocardiogram examinations, most                 recent 12/27/2022. Risk Factors:Hypertension.  Sonographer:    Jeralene Mom Referring Phys: [id number] RONDELL A SMITH IMPRESSIONS  1. Left ventricular ejection fraction, by estimation, is 65 to 70%. The left ventricle has normal function. The left ventricle has no regional wall motion abnormalities. There is mild left ventricular hypertrophy. Left ventricular diastolic parameters are consistent with Grade I diastolic dysfunction (impaired relaxation).  2. Right ventricular systolic function is normal. The right ventricular size is normal. There is normal pulmonary artery systolic pressure.  3. Left atrial size was moderately dilated.  4. The mitral valve is normal in structure. Trivial mitral valve regurgitation. No evidence of mitral stenosis.  5. The aortic valve is tricuspid. Aortic valve regurgitation is not visualized. No aortic stenosis is present.  6. The inferior vena cava is normal in size with greater than 50% respiratory variability, suggesting right atrial pressure of 3 mmHg. FINDINGS  Left Ventricle: Left ventricular ejection fraction, by estimation, is 65 to 70%. The left ventricle has normal function. The left ventricle has no regional wall motion abnormalities. The left ventricular internal cavity size was normal in size. There is  mild left ventricular hypertrophy. Left ventricular diastolic parameters are consistent with Grade I diastolic dysfunction (impaired relaxation). Right Ventricle: The right ventricular  size is normal. No increase in right ventricular wall thickness. Right ventricular systolic function is normal. There is normal pulmonary artery systolic pressure. The tricuspid regurgitant velocity is 2.01 m/s, and  with an assumed right atrial pressure of 3 mmHg, the estimated right ventricular systolic pressure is 19.2 mmHg. Left Atrium: Left atrial size was moderately dilated. Right Atrium: Right atrial size was normal in size. Pericardium: There is no evidence of pericardial effusion. Mitral Valve: The mitral valve is normal in structure. Trivial mitral valve regurgitation. No evidence of mitral valve stenosis. Tricuspid Valve: The tricuspid valve is normal in structure. Tricuspid valve regurgitation is not demonstrated. No evidence of tricuspid stenosis. Aortic Valve: The aortic valve is tricuspid. Aortic valve regurgitation is not visualized. No aortic stenosis is present. Aortic valve mean gradient measures 5.0 mmHg. Aortic valve peak gradient measures 9.5 mmHg. Aortic valve area, by VTI measures 3.10 cm. Pulmonic Valve: The pulmonic valve was normal in structure. Pulmonic valve regurgitation is not visualized. No evidence of pulmonic stenosis. Aorta: The aortic root is normal in size and structure. Venous:  The inferior vena cava is normal in size with greater than 50% respiratory variability, suggesting right atrial pressure of 3 mmHg. IAS/Shunts: No atrial level shunt detected by color flow Doppler.  LEFT VENTRICLE PLAX 2D LVIDd:         4.00 cm   Diastology LVIDs:         2.30 cm   LV e' medial:    6.96 cm/s LV PW:         0.90 cm   LV E/e' medial:  10.8 LV IVS:        0.90 cm   LV e' lateral:   7.51 cm/s LVOT diam:     2.00 cm   LV E/e' lateral: 10.0 LV SV:         103 LV SV Index:   54 LVOT Area:     3.14 cm  RIGHT VENTRICLE RV Basal diam:  3.60 cm RV Mid diam:    3.00 cm RV S prime:     18.20 cm/s TAPSE (M-mode): 2.6 cm LEFT ATRIUM             Index        RIGHT ATRIUM           Index LA diam:         3.80 cm 1.99 cm/m   RA Area:     15.30 cm LA Vol (A2C):   84.4 ml 44.09 ml/m  RA Volume:   36.70 ml  19.17 ml/m LA Vol (A4C):   74.6 ml 38.97 ml/m LA Biplane Vol: 84.3 ml 44.04 ml/m  AORTIC VALVE AV Area (Vmax):    3.04 cm AV Area (Vmean):   2.97 cm AV Area (VTI):     3.10 cm AV Vmax:           154.00 cm/s AV Vmean:          107.000 cm/s AV VTI:            0.332 m AV Peak Grad:      9.5 mmHg AV Mean Grad:      5.0 mmHg LVOT Vmax:         149.00 cm/s LVOT Vmean:        101.000 cm/s LVOT VTI:          0.328 m LVOT/AV VTI ratio: 0.99  AORTA Ao Root diam: 2.60 cm Ao Asc diam:  2.70 cm MITRAL VALVE                TRICUSPID VALVE MV Area (PHT): 3.34 cm     TR Peak grad:   16.2 mmHg MV Decel Time: 227 msec     TR Vmax:        201.00 cm/s MR Peak grad: 70.9 mmHg MR Vmax:      421.00 cm/s   SHUNTS MV E velocity: 75.20 cm/s   Systemic VTI:  0.33 m MV A velocity: 107.00 cm/s  Systemic Diam: 2.00 cm MV E/A ratio:  0.70 Arta Lark Electronically signed by Arta Lark Signature Date/Time: 08/19/2023/5:31:34 PM    Final    VAS US  LOWER EXTREMITY VENOUS (DVT) Result Date: 08/19/2023  Lower Venous DVT Study Patient Name:  YOSHIKO KELEHER  Date of Exam:   08/19/2023 Medical Rec #: [id number]              Accession #:    [id number] Date of Birth: 04-22-1950              Patient  Gender: F Patient Age:   70 years Exam Location:  [facility] Procedure:      VAS US  LOWER EXTREMITY VENOUS (DVT) Referring Phys: Fraser Jackson XU --------------------------------------------------------------------------------  Indications: Embolic stroke.  Performing Technologist: Franky Ivanoff Sturdivant-Jones RDMS, RVT  Examination Guidelines: A complete evaluation includes B-mode imaging, spectral Doppler, color Doppler, and power Doppler as needed of all accessible portions of each vessel. Bilateral testing is considered an integral part of a complete examination. Limited examinations for reoccurring indications may be performed as noted.  The reflux portion of the exam is performed with the patient in reverse Trendelenburg.  +---------+---------------+---------+-----------+----------+--------------+ RIGHT    CompressibilityPhasicitySpontaneityPropertiesThrombus Aging +---------+---------------+---------+-----------+----------+--------------+ CFV      Full           Yes      Yes                                 +---------+---------------+---------+-----------+----------+--------------+ SFJ      Full                                                        +---------+---------------+---------+-----------+----------+--------------+ FV Prox  Full                                                        +---------+---------------+---------+-----------+----------+--------------+ FV Mid   Full                                                        +---------+---------------+---------+-----------+----------+--------------+ FV DistalFull                                                        +---------+---------------+---------+-----------+----------+--------------+ PFV      Full                                                        +---------+---------------+---------+-----------+----------+--------------+ POP      Full           Yes      Yes                                 +---------+---------------+---------+-----------+----------+--------------+ PTV      Full                                                        +---------+---------------+---------+-----------+----------+--------------+ PERO     Full                                                        +---------+---------------+---------+-----------+----------+--------------+   +---------+---------------+---------+-----------+----------+--------------+  LEFT     CompressibilityPhasicitySpontaneityPropertiesThrombus Aging +---------+---------------+---------+-----------+----------+--------------+ CFV      Full           Yes       Yes                                 +---------+---------------+---------+-----------+----------+--------------+ SFJ      Full                                                        +---------+---------------+---------+-----------+----------+--------------+ FV Prox  Full                                                        +---------+---------------+---------+-----------+----------+--------------+ FV Mid   Full                                                        +---------+---------------+---------+-----------+----------+--------------+ FV DistalFull                                                        +---------+---------------+---------+-----------+----------+--------------+ PFV      Full                                                        +---------+---------------+---------+-----------+----------+--------------+ POP      Full           Yes      Yes                                 +---------+---------------+---------+-----------+----------+--------------+ PTV      Full                                                        +---------+---------------+---------+-----------+----------+--------------+ PERO     Full                                                        +---------+---------------+---------+-----------+----------+--------------+     Summary: BILATERAL: - No evidence of deep vein thrombosis seen in the lower extremities, bilaterally. -No evidence of popliteal cyst, bilaterally.   *See table(s) above for measurements and observations. Electronically signed by Jimmye Moulds MD on 08/19/2023 at 5:09:13 PM.  Final        Feliciana Horn M.D. [facility] 08/21/2023, 9:44 AM  Available via Epic secure chat 7am-7pm After 7 pm, please refer to night coverage provider listed on amion.

## 2023-08-22 DIAGNOSIS — E669 Obesity, unspecified: Secondary | ICD-10-CM | POA: Diagnosis not present

## 2023-08-22 DIAGNOSIS — N1831 Chronic kidney disease, stage 3a: Secondary | ICD-10-CM | POA: Diagnosis not present

## 2023-08-22 DIAGNOSIS — I10 Essential (primary) hypertension: Secondary | ICD-10-CM | POA: Diagnosis not present

## 2023-08-22 DIAGNOSIS — I63422 Cerebral infarction due to embolism of left anterior cerebral artery: Secondary | ICD-10-CM | POA: Diagnosis not present

## 2023-08-22 MED ORDER — ROSUVASTATIN CALCIUM 20 MG PO TABS: 20.0000 mg | ORAL_TABLET | Freq: Every day | ORAL | 3 refills | Status: DC

## 2023-08-22 MED ORDER — SENNOSIDES-DOCUSATE SODIUM 8.6-50 MG PO TABS
2.0000 | ORAL_TABLET | Freq: Two times a day (BID) | ORAL | Status: DC
  Administered 2023-08-22 (×2): 2 via ORAL
  Filled 2023-08-22 (×3): qty 2

## 2023-08-22 MED ORDER — ASPIRIN 81 MG PO TBEC: 81.0000 mg | DELAYED_RELEASE_TABLET | Freq: Every day | ORAL | 12 refills | Status: AC

## 2023-08-22 MED ORDER — CLOPIDOGREL BISULFATE 75 MG PO TABS: 75.0000 mg | ORAL_TABLET | Freq: Every day | ORAL | 0 refills | Status: DC

## 2023-08-22 MED ORDER — AMLODIPINE BESYLATE 2.5 MG PO TABS: 2.5000 mg | ORAL_TABLET | Freq: Every day | ORAL | Status: DC

## 2023-08-22 MED ORDER — POLYETHYLENE GLYCOL 3350 17 G PO PACK
17.0000 g | PACK | Freq: Every day | ORAL | Status: DC
  Administered 2023-08-22: 17 g via ORAL
  Filled 2023-08-22 (×2): qty 1

## 2023-08-22 MED ORDER — BUTALBITAL-APAP-CAFFEINE 50-325-40 MG PO TABS
1.0000 | ORAL_TABLET | Freq: Four times a day (QID) | ORAL | Status: DC | PRN
  Administered 2023-08-22 – 2023-08-23 (×2): 1 via ORAL
  Filled 2023-08-22 (×2): qty 1

## 2023-08-22 MED ORDER — CAPSAICIN 0.025 % EX CREA: TOPICAL_CREAM | Freq: Two times a day (BID) | CUTANEOUS | Status: DC | PRN

## 2023-08-22 NOTE — Care Management Important Message (Signed)
 Important Message  Patient Details  Name: Amy Hooper MRN: [id number] Date of Birth: 1951/01/15   Important Message Given:  Yes - Medicare IM     Wynonia Hedges 08/22/2023, 2:27 PM

## 2023-08-22 NOTE — Plan of Care (Signed)
  Problem: Education: Goal: Knowledge of disease or condition will improve Outcome: Progressing Goal: Knowledge of secondary prevention will improve (MUST DOCUMENT ALL) Outcome: Progressing Goal: Knowledge of patient specific risk factors will improve (DELETE if not current risk factor) Outcome: Progressing   Problem: Ischemic Stroke/TIA Tissue Perfusion: Goal: Complications of ischemic stroke/TIA will be minimized Outcome: Progressing   Problem: Coping: Goal: Will verbalize positive feelings about self Outcome: Progressing Goal: Will identify appropriate support needs Outcome: Progressing   Problem: Health Behavior/Discharge Planning: Goal: Ability to manage health-related needs will improve Outcome: Progressing Goal: Goals will be collaboratively established with patient/family Outcome: Progressing   Problem: Self-Care: Goal: Ability to participate in self-care as condition permits will improve Outcome: Progressing Goal: Verbalization of feelings and concerns over difficulty with self-care will improve Outcome: Progressing Goal: Ability to communicate needs accurately will improve Outcome: Progressing   Problem: Nutrition: Goal: Risk of aspiration will decrease Outcome: Progressing Goal: Dietary intake will improve Outcome: Progressing   Problem: Education: Goal: Knowledge of General Education information will improve Description: Including pain rating scale, medication(s)/side effects and non-pharmacologic comfort measures Outcome: Progressing

## 2023-08-22 NOTE — Plan of Care (Signed)
  Problem: Education: Goal: Knowledge of disease or condition will improve Outcome: Progressing   Problem: Ischemic Stroke/TIA Tissue Perfusion: Goal: Complications of ischemic stroke/TIA will be minimized Outcome: Progressing   Problem: Coping: Goal: Will identify appropriate support needs Outcome: Progressing   Problem: Health Behavior/Discharge Planning: Goal: Ability to manage health-related needs will improve Outcome: Progressing   Problem: Self-Care: Goal: Ability to participate in self-care as condition permits will improve Outcome: Progressing   Problem: Nutrition: Goal: Risk of aspiration will decrease Outcome: Progressing Goal: Dietary intake will improve Outcome: Progressing   Problem: Education: Goal: Knowledge of General Education information will improve Description: Including pain rating scale, medication(s)/side effects and non-pharmacologic comfort measures Outcome: Progressing   Problem: Clinical Measurements: Goal: Ability to maintain clinical measurements within normal limits will improve Outcome: Progressing Goal: Will remain free from infection Outcome: Progressing Goal: Diagnostic test results will improve Outcome: Progressing Goal: Respiratory complications will improve Outcome: Progressing Goal: Cardiovascular complication will be avoided Outcome: Progressing   Problem: Activity: Goal: Risk for activity intolerance will decrease Outcome: Progressing   Problem: Nutrition: Goal: Adequate nutrition will be maintained Outcome: Progressing   Problem: Coping: Goal: Level of anxiety will decrease Outcome: Progressing   Problem: Elimination: Goal: Will not experience complications related to bowel motility Outcome: Progressing Goal: Will not experience complications related to urinary retention Outcome: Progressing   Problem: Pain Managment: Goal: General experience of comfort will improve and/or be controlled Outcome: Progressing    Problem: Safety: Goal: Ability to remain free from injury will improve Outcome: Progressing   Problem: Skin Integrity: Goal: Risk for impaired skin integrity will decrease Outcome: Progressing

## 2023-08-22 NOTE — Discharge Summary (Addendum)
 Physician Discharge Summary   [name] MRN: [id number] DOB: 06/10/1950  Admit date:     08/18/2023  Discharge date: 08/22/23  Discharge Physician: Feliciana Horn   PCP: Annella Kief, NP   Recommendations at discharge:  Please follow up with PCP in one week.  Please follow up with neurology as recommended.   Discharge Diagnoses: Principal Problem:   CVA (cerebrovascular accident) [facility]) Active Problems:   Premature atrial complexes   Essential hypertension   Hyperlipidemia   CKD (chronic kidney disease) stage 3, GFR 30-59 ml/min (HCC)   Fibromyalgia   Depression   Gout   Insomnia   Obesity (BMI 30-39.9)  Resolved Problems:   * No resolved hospital problems. *  Hospital Course: Amy Hooper is a 73 y.o. female with medical history significant of hypertension, hyperlipidemia, diabetes mellitus type 2, depression, CKD stage IIIb, and fibromyalgia reports worsening spasms of the right lower extremity , thought it was probably her sciatica pain.  Additionally she reports that while walking her RLE was dragging, for atleast a day prior to admission. Patient presented as a code stroke.  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  Neurology had been consulted.  Patient was not a thrombolytics candidate and also deemed not a candidate for endovascular intervention.  Patient had been started on dual antiplatelet therapy.  TRH consulted to admit for completion of stroke workup.     Assessment and Plan:   Acute CVA MRI brain shows Small region of acute ischemia within the posterior paramedian left frontal lobe.  Further stroke work up in progress.  Echocardiogram shows LVEF of 65 to 70%, no regional wall motion ab. Grade 1 diastolic dysfunction. Left atrial size is mod dilated.  Continue  with aspirin  and plavix  for 21 days followed by aspirin  alone and crestor .  LDL is 136 , A1c is 5 Lower extremity duplex is negative for DVT.   Neurology on board.  Therapy evals done.  CIR on board.  Plan for loop recorder prior to discharge.  EP team aware.      Type 2 DM A1c is 5 Diet controlled.     Hypertension Well controlled.  Decrease the dose of amlodipine  2.5 mg daily and losartan  25 mg daily.      Hyperlipidemia LDL is 136, continue with statin.      Stage 3 b CKD Creatinine at baseline.  No new labs.      Fibromyalgia Resume home meds.    Body mass index is 36.69 kg/m. Obesity.  Recommend outpatient follow up with PCP     Gout  Continue with allopurinol .      Headache Tylenol  and oxy prn.        Consultants: neurology.  Procedures performed: echocardiogram.   Disposition: Rehabilitation facility Diet recommendation:  Discharge Diet Orders (From admission, onward)     Start     Ordered   08/22/23 0000  Diet - low sodium heart healthy        08/22/23 0923           Carb modified diet DISCHARGE MEDICATION: Allergies as of 08/22/2023       Reactions   Cefuroxime Hives, Rash   Other reaction(s): Unknown Other Reaction(s): Unknown   Cefuroxime Axetil Rash, Hives   Clarithromycin Hives, Rash  Other reaction(s): Unknown Other Reaction(s): Unknown   Nsaids Nausea And Vomiting   Other reaction(s): Unknown Other Reaction(s): Unknown   Hydrocodone     Other reaction(s): Unknown Other Reaction(s): Unknown   Hydrocodone -acetaminophen  Other (See Comments)   Stomach upset Other Reaction(s): Other Stomach upset    Stomach upset, Stomach upset    Stomach upset,     Stomach upset  Stomach upset,   Other Other (See Comments), Nausea And Vomiting   Upset stomach Other Reaction(s): Other (See Comments), Unknown Upset stomach    Upset stomach Other reaction(s): Unknown   Oxycodone -acetaminophen  Other (See Comments)   Other  reaction(s): Unknown Other Reaction(s): Other, Unknown Hallucination   Robaxin  [methocarbamol ] Nausea And Vomiting   "It tears my stomach up."   Toradol  [ketorolac  Tromethamine ] Nausea And Vomiting   Tramadol  Nausea And Vomiting, Other (See Comments)   Upset stomach Other reaction(s): Unknown Other Reaction(s): Other, Unknown Upset stomach    Upset stomach Other reaction(s): Unknown        Medication List     TAKE these medications    allopurinol  100 MG tablet Commonly known as: ZYLOPRIM  Take 2 tablets (200 mg total) by mouth daily.   amLODipine  2.5 MG tablet Commonly known as: NORVASC  Take 1 tablet (2.5 mg total) by mouth daily. Start taking on: August 23, 2023 What changed:  medication strength how much to take   aspirin  EC 81 MG tablet Take 1 tablet (81 mg total) by mouth daily. Swallow whole. Start taking on: August 23, 2023   clopidogrel  75 MG tablet Commonly known as: PLAVIX  Take 1 tablet (75 mg total) by mouth daily. Start taking on: August 23, 2023   dapagliflozin  propanediol 5 MG Tabs tablet Commonly known as: FARXIGA  Take 1 tablet (5 mg total) by mouth daily.   DULoxetine  20 MG capsule Commonly known as: CYMBALTA  TAKE 1 CAPSULE BY MOUTH DAILY What changed: when to take this   eszopiclone  1 MG Tabs tablet Commonly known as: LUNESTA  Take 1 tablet (1 mg total) by mouth at bedtime as needed for sleep. Take immediately before bedtime What changed:  when to take this additional instructions   fluticasone  50 MCG/ACT nasal spray Commonly known as: FLONASE  Place 1 spray into both nostrils daily as needed for allergies.   HYDROcodone -acetaminophen  7.5-325 MG tablet Commonly known as: NORCO Take 1 tablet by mouth 3 (three) times daily as needed for moderate pain (pain score 4-6).   hydrocortisone  2.5 % rectal cream Commonly known as: ANUSOL -HC PLACE 1 APPLICATION RECTALLY 2 (TWO) TIMES DAILY.   ketoconazole  2 % cream Commonly known as: NIZORAL  Apply 1  Application topically daily. Apply to rash twice a day. If no improvement after 2 weeks, please let provider know.   loratadine  10 MG tablet Commonly known as: CLARITIN  Take 10 mg by mouth daily as needed for allergies.   losartan  50 MG tablet Commonly known as: COZAAR  Take 1 tablet (50 mg total) by mouth daily.   mometasone  0.1 % cream Commonly known as: ELOCON  Apply very thin layer to rash at bedtime. May use one other time during the day if needed. Stop after 7 days, if still needed may repeat after 3 day break.   pregabalin  75 MG capsule Commonly known as: Lyrica  Take 1 capsule (75 mg total) by mouth 2 (two) times daily.   rosuvastatin  20 MG tablet Commonly known as: CRESTOR  Take 1 tablet (20 mg total) by mouth daily. Start taking on: August 23, 2023   Vitamin D  (Ergocalciferol ) 1.25 MG (  50000 UNIT) Caps capsule Commonly known as: DRISDOL  Take 1 capsule (50,000 Units total) by mouth every 7 (seven) days.        Follow-up Information     [facility]. Schedule an appointment as soon as possible for a visit in 1 month(s).   Specialty: Neurology Why: stroke clinic Contact information: [address] [place]  [phone]               Discharge Exam: Cleavon Curls Weights   08/19/23 0644 08/22/23 0510  Weight: 91 kg 92.2 kg   General exam: Appears calm and comfortable  Respiratory system: Clear to auscultation. Respiratory effort normal. Cardiovascular system: S1 & S2 heard, RRR. No JVD, Gastrointestinal system: Abdomen is nondistended, soft and nontender. Central nervous system: Alert and oriented.  Extremities: Symmetric 5 x 5 power. Skin: No rashes,  Psychiatry:  Mood & affect appropriate.    Condition at discharge: fair  The results of significant diagnostics from this hospitalization (including imaging, microbiology, ancillary and laboratory) are listed below for reference.   Imaging  Studies: ECHOCARDIOGRAM COMPLETE Result Date: 08/19/2023    ECHOCARDIOGRAM REPORT   Patient Name:   AVNOOR KOURY Date of Exam: 08/19/2023 Medical Rec #:  [id number]             Height:       62.0 in Accession #:    [id number]            Weight:       200.6 lb Date of Birth:  Nov 01, 1950             BSA:          1.914 m Patient Age:    72 years              BP:           128/70 mmHg Patient Gender: F                     HR:           68 bpm. Exam Location:  Inpatient Procedure: 2D Echo, Color Doppler and Cardiac Doppler (Both Spectral and Color            Flow Doppler were utilized during procedure). Indications:    Stroke I63.9  History:        Patient has prior history of Echocardiogram examinations, most                 recent 12/27/2022. Risk Factors:Hypertension.  Sonographer:    Jeralene Mom Referring Phys: [id number] RONDELL A SMITH IMPRESSIONS  1. Left ventricular ejection fraction, by estimation, is 65 to 70%. The left ventricle has normal function. The left ventricle has no regional wall motion abnormalities. There is mild left ventricular hypertrophy. Left ventricular diastolic parameters are consistent with Grade I diastolic dysfunction (impaired relaxation).  2. Right ventricular systolic function is normal. The right ventricular size is normal. There is normal pulmonary artery systolic pressure.  3. Left atrial size was moderately dilated.  4. The mitral valve is normal in structure. Trivial mitral valve regurgitation. No evidence of mitral stenosis.  5. The aortic valve is tricuspid. Aortic valve regurgitation is not visualized. No aortic stenosis is present.  6. The inferior vena cava is normal in size with greater than 50% respiratory variability, suggesting right atrial pressure of 3 mmHg. FINDINGS  Left Ventricle: Left ventricular ejection fraction, by estimation, is 65 to 70%.  The left ventricle has normal function. The left ventricle has no regional wall motion abnormalities. The left  ventricular internal cavity size was normal in size. There is  mild left ventricular hypertrophy. Left ventricular diastolic parameters are consistent with Grade I diastolic dysfunction (impaired relaxation). Right Ventricle: The right ventricular size is normal. No increase in right ventricular wall thickness. Right ventricular systolic function is normal. There is normal pulmonary artery systolic pressure. The tricuspid regurgitant velocity is 2.01 m/s, and  with an assumed right atrial pressure of 3 mmHg, the estimated right ventricular systolic pressure is 19.2 mmHg. Left Atrium: Left atrial size was moderately dilated. Right Atrium: Right atrial size was normal in size. Pericardium: There is no evidence of pericardial effusion. Mitral Valve: The mitral valve is normal in structure. Trivial mitral valve regurgitation. No evidence of mitral valve stenosis. Tricuspid Valve: The tricuspid valve is normal in structure. Tricuspid valve regurgitation is not demonstrated. No evidence of tricuspid stenosis. Aortic Valve: The aortic valve is tricuspid. Aortic valve regurgitation is not visualized. No aortic stenosis is present. Aortic valve mean gradient measures 5.0 mmHg. Aortic valve peak gradient measures 9.5 mmHg. Aortic valve area, by VTI measures 3.10 cm. Pulmonic Valve: The pulmonic valve was normal in structure. Pulmonic valve regurgitation is not visualized. No evidence of pulmonic stenosis. Aorta: The aortic root is normal in size and structure. Venous: The inferior vena cava is normal in size with greater than 50% respiratory variability, suggesting right atrial pressure of 3 mmHg. IAS/Shunts: No atrial level shunt detected by color flow Doppler.  LEFT VENTRICLE PLAX 2D LVIDd:         4.00 cm   Diastology LVIDs:         2.30 cm   LV e' medial:    6.96 cm/s LV PW:         0.90 cm   LV E/e' medial:  10.8 LV IVS:        0.90 cm   LV e' lateral:   7.51 cm/s LVOT diam:     2.00 cm   LV E/e' lateral: 10.0 LV SV:          103 LV SV Index:   54 LVOT Area:     3.14 cm  RIGHT VENTRICLE RV Basal diam:  3.60 cm RV Mid diam:    3.00 cm RV S prime:     18.20 cm/s TAPSE (M-mode): 2.6 cm LEFT ATRIUM             Index        RIGHT ATRIUM           Index LA diam:        3.80 cm 1.99 cm/m   RA Area:     15.30 cm LA Vol (A2C):   84.4 ml 44.09 ml/m  RA Volume:   36.70 ml  19.17 ml/m LA Vol (A4C):   74.6 ml 38.97 ml/m LA Biplane Vol: 84.3 ml 44.04 ml/m  AORTIC VALVE AV Area (Vmax):    3.04 cm AV Area (Vmean):   2.97 cm AV Area (VTI):     3.10 cm AV Vmax:           154.00 cm/s AV Vmean:          107.000 cm/s AV VTI:            0.332 m AV Peak Grad:      9.5 mmHg AV Mean Grad:      5.0 mmHg LVOT Vmax:  149.00 cm/s LVOT Vmean:        101.000 cm/s LVOT VTI:          0.328 m LVOT/AV VTI ratio: 0.99  AORTA Ao Root diam: 2.60 cm Ao Asc diam:  2.70 cm MITRAL VALVE                TRICUSPID VALVE MV Area (PHT): 3.34 cm     TR Peak grad:   16.2 mmHg MV Decel Time: 227 msec     TR Vmax:        201.00 cm/s MR Peak grad: 70.9 mmHg MR Vmax:      421.00 cm/s   SHUNTS MV E velocity: 75.20 cm/s   Systemic VTI:  0.33 m MV A velocity: 107.00 cm/s  Systemic Diam: 2.00 cm MV E/A ratio:  0.70 Arta Lark Electronically signed by Arta Lark Signature Date/Time: 08/19/2023/5:31:34 PM    Final    VAS US  LOWER EXTREMITY VENOUS (DVT) Result Date: 08/19/2023  Lower Venous DVT Study Patient Name:  CHIZUKO TRINE  Date of Exam:   08/19/2023 Medical Rec #: [id number]              Accession #:    [id number] Date of Birth: [birth year]              Patient Gender: F Patient Age:   39 years Exam Location:  [facility] Procedure:      VAS US  LOWER EXTREMITY VENOUS (DVT) Referring Phys: Fraser Jackson XU --------------------------------------------------------------------------------  Indications: Embolic stroke.  Performing Technologist: Franky Ivanoff Sturdivant-Jones RDMS, RVT  Examination Guidelines: A complete evaluation includes B-mode imaging,  spectral Doppler, color Doppler, and power Doppler as needed of all accessible portions of each vessel. Bilateral testing is considered an integral part of a complete examination. Limited examinations for reoccurring indications may be performed as noted. The reflux portion of the exam is performed with the patient in reverse Trendelenburg.  +---------+---------------+---------+-----------+----------+--------------+ RIGHT    CompressibilityPhasicitySpontaneityPropertiesThrombus Aging +---------+---------------+---------+-----------+----------+--------------+ CFV      Full           Yes      Yes                                 +---------+---------------+---------+-----------+----------+--------------+ SFJ      Full                                                        +---------+---------------+---------+-----------+----------+--------------+ FV Prox  Full                                                        +---------+---------------+---------+-----------+----------+--------------+ FV Mid   Full                                                        +---------+---------------+---------+-----------+----------+--------------+ FV DistalFull                                                        +---------+---------------+---------+-----------+----------+--------------+  PFV      Full                                                        +---------+---------------+---------+-----------+----------+--------------+ POP      Full           Yes      Yes                                 +---------+---------------+---------+-----------+----------+--------------+ PTV      Full                                                        +---------+---------------+---------+-----------+----------+--------------+ PERO     Full                                                        +---------+---------------+---------+-----------+----------+--------------+    +---------+---------------+---------+-----------+----------+--------------+ LEFT     CompressibilityPhasicitySpontaneityPropertiesThrombus Aging +---------+---------------+---------+-----------+----------+--------------+ CFV      Full           Yes      Yes                                 +---------+---------------+---------+-----------+----------+--------------+ SFJ      Full                                                        +---------+---------------+---------+-----------+----------+--------------+ FV Prox  Full                                                        +---------+---------------+---------+-----------+----------+--------------+ FV Mid   Full                                                        +---------+---------------+---------+-----------+----------+--------------+ FV DistalFull                                                        +---------+---------------+---------+-----------+----------+--------------+ PFV      Full                                                        +---------+---------------+---------+-----------+----------+--------------+  POP      Full           Yes      Yes                                 +---------+---------------+---------+-----------+----------+--------------+ PTV      Full                                                        +---------+---------------+---------+-----------+----------+--------------+ PERO     Full                                                        +---------+---------------+---------+-----------+----------+--------------+     Summary: BILATERAL: - No evidence of deep vein thrombosis seen in the lower extremities, bilaterally. -No evidence of popliteal cyst, bilaterally.   *See table(s) above for measurements and observations. Electronically signed by Jimmye Moulds MD on 08/19/2023 at 5:09:13 PM.    Final    MR BRAIN WO CONTRAST Result Date: 08/18/2023 CLINICAL  DATA:  Stroke follow-up EXAM: MRI HEAD WITHOUT CONTRAST TECHNIQUE: Multiplanar, multiecho pulse sequences of the brain and surrounding structures were obtained without intravenous contrast. COMPARISON:  None Available. FINDINGS: Brain: Small region of acute ischemia within the posterior paramedian left frontal lobe. No acute or chronic hemorrhage. There is multifocal hyperintense T2-weighted signal within the white matter. Parenchymal volume and CSF spaces are normal. The midline structures are normal. Vascular: Normal flow voids. Skull and upper cervical spine: Normal calvarium and skull base. Visualized upper cervical spine and soft tissues are normal. Sinuses/Orbits:No paranasal sinus fluid levels or advanced mucosal thickening. No mastoid or middle ear effusion. Normal orbits. IMPRESSION: Small region of acute ischemia within the posterior paramedian left frontal lobe. No hemorrhage or mass effect. Electronically Signed   By: Juanetta Nordmann M.D.   On: 08/18/2023 23:05   CT ANGIO HEAD NECK W WO CM Addendum Date: 08/18/2023 ADDENDUM REPORT: 08/18/2023 13:52 ADDENDUM: These results were called by telephone at the time of interpretation on 08/18/2023 at 1:52 pm to provider Hiawatha Lout , who verbally acknowledged these results. Electronically Signed   By: Denny Flack M.D.   On: 08/18/2023 13:52   Result Date: 08/18/2023 CLINICAL DATA:  Neuro deficit, concern for stroke. Weakness and right leg and arm with tingling in right leg. Gait abnormality due to weakness. EXAM: CT ANGIOGRAPHY HEAD AND NECK WITH AND WITHOUT CONTRAST TECHNIQUE: Multidetector CT imaging of the head and neck was performed using the standard protocol during bolus administration of intravenous contrast. Multiplanar CT image reconstructions and MIPs were obtained to evaluate the vascular anatomy. Carotid stenosis measurements (when applicable) are obtained utilizing NASCET criteria, using the distal internal carotid diameter as the  denominator. RADIATION DOSE REDUCTION: This exam was performed according to the departmental dose-optimization program which includes automated exposure control, adjustment of the mA and/or kV according to patient size and/or use of iterative reconstruction technique. CONTRAST:  75mL OMNIPAQUE  IOHEXOL  350 MG/ML SOLN COMPARISON:  None Available. FINDINGS: CT HEAD FINDINGS Brain: No acute intracranial hemorrhage. No CT evidence of acute infarct. No edema, mass effect,  or midline shift. The basilar cisterns are patent. Ventricles: Ventricles are normal in size and configuration. Vascular: No hyperdense vessel. Intracranial atherosclerotic calcifications noted. Skull: No acute or aggressive finding. Sinuses/orbits: Mild mucosal thickening in the ethmoid sinuses. Other: Mastoid air cells are clear. CTA NECK FINDINGS Aortic arch: Common origin of the brachiocephalic and left common carotid arteries. Imaged portion shows no evidence of aneurysm or dissection. Mild atherosclerosis. No significant stenosis of the major arch vessel origins. Pulmonary arteries: As permitted by contrast timing, there are no filling defects in the visualized pulmonary arteries. Subclavian arteries: The subclavian arteries are patent bilaterally. Right carotid system: Patent. Mild atherosclerosis at the carotid bifurcation without hemodynamically significant stenosis. No evidence of dissection. Left carotid system: Patent. Mild atherosclerosis at the carotid bifurcation without hemodynamically significant stenosis. No evidence of dissection. Vertebral arteries: Codominant. No evidence of dissection, stenosis (50% or greater), or occlusion. Mild tortuosity of the V1 and V2 segments. Skeleton: No acute findings. Degenerative changes in the cervical spine. Other neck: The visualized airway is patent. No cervical lymphadenopathy. Upper chest: Visualized lung apices are clear. Review of the MIP images confirms the above findings CTA HEAD FINDINGS  ANTERIOR CIRCULATION: The intracranial ICAs are patent bilaterally. Mild atherosclerosis of the carotid siphons. No significant stenosis, proximal occlusion, aneurysm, or vascular malformation. MCAs: The middle cerebral arteries are patent bilaterally. ACAs: The right ACA is patent. The A1 and A2 segments are patent on the left. There is occlusion of the A3 and proximal A4 segments of the left ACA with reconstitution of the left A4 segment over the anterior body of the corpus callosum. POSTERIOR CIRCULATION: No significant stenosis, proximal occlusion, aneurysm, or vascular malformation. PCAs: The posterior cerebral arteries are patent bilaterally. Pcomm: The posterior communicating arteries are visualized bilaterally. SCAs: The superior cerebellar arteries are patent bilaterally. Basilar artery: Patent AICAs: Patent PICAs: Patent Vertebral arteries: The intracranial vertebral arteries are patent. Venous sinuses: As permitted by contrast timing, patent. 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 There is reconstitution of the left A4 segment over the anterior body of the corpus callosum with diminutive caliber. Intracranial arterial vasculature is otherwise patent. Mild atherosclerosis as above.  No high-grade stenosis. No acute intracranial hemorrhage. Aortic Atherosclerosis (ICD10-I70.0). Electronically Signed: By: Denny Flack M.D. On: 08/18/2023 13:46    Microbiology: Results for orders placed or performed during the hospital encounter of 03/25/21  Surgical pcr screen     Status: None   Collection Time: 03/25/21  1:19 PM   Specimen: Nasal Mucosa; Nasal Swab  Result Value Ref Range Status   MRSA, PCR NEGATIVE NEGATIVE Final   Staphylococcus aureus NEGATIVE NEGATIVE Final    Comment: (NOTE) The Xpert SA Assay (FDA approved for NASAL specimens in patients 59 years of age and older),  is one component of a comprehensive surveillance program. It is not intended to diagnose infection nor to guide or monitor treatment. Performed at [facility], [address] [place], [place] [address]   SARS Coronavirus 2 by RT PCR (hospital order, performed in [facility] hospital lab) Nasopharyngeal Nasopharyngeal Swab     Status: None   Collection Time: 03/25/21  1:19 PM   Specimen: Nasopharyngeal Swab  Result Value Ref Range Status   SARS Coronavirus 2 NEGATIVE NEGATIVE Final    Comment: (NOTE) SARS-CoV-2 target nucleic acids are NOT DETECTED.  The SARS-CoV-2 RNA is generally  detectable in upper and lower respiratory specimens during the acute phase of infection. The lowest concentration of SARS-CoV-2 viral copies this assay can detect is 250 copies / mL. A negative result does not preclude SARS-CoV-2 infection and should not be used as the sole basis for treatment or other patient management decisions.  A negative result may occur with improper specimen collection / handling, submission of specimen other than nasopharyngeal swab, presence of viral mutation(s) within the areas targeted by this assay, and inadequate number of viral copies (<250 copies / mL). A negative result must be combined with clinical observations, patient history, and epidemiological information.  Fact Sheet for Patients:   BoilerBrush.com.cy  Fact Sheet for Healthcare Providers: https://pope.com/  This test is not yet approved or  cleared by the [place]  FDA and has been authorized for detection and/or diagnosis of SARS-CoV-2 by FDA under an Emergency Use Authorization (EUA).  This EUA will remain in effect (meaning this test can be used) for the duration of the COVID-19 declaration under Section 564(b)(1) of the Act, 21 U.S.C. section 360bbb-3(b)(1), unless the authorization is terminated or revoked sooner.  Performed at [facility], [address], [place], [place] [address]   Aerobic/Anaerobic Culture w Gram Stain (surgical/deep wound)     Status: None   Collection Time: 03/25/21  3:58 PM   Specimen: PATH Other; Tissue  Result Value Ref Range Status   Specimen Description WOUND  Final   Special Requests LUMBAR INCINSION  Final   Gram Stain NO WBC SEEN NO ORGANISMS SEEN   Final   Culture   Final    FEW ENTEROBACTER AEROGENES FEW MORGANELLA MORGANII RARE BACTEROIDES FRAGILIS BETA LACTAMASE POSITIVE Performed at [facility], [address], [place], [place] [address]    Report Status 03/29/2021 FINAL  Final   Organism ID, Bacteria ENTEROBACTER AEROGENES  Final   Organism ID, Bacteria MORGANELLA MORGANII  Final      Susceptibility   Klebsiella aerogenes - MIC*    CEFAZOLIN  >=64 RESISTANT Resistant     CEFEPIME  <=0.12 SENSITIVE Sensitive     CEFTAZIDIME <=1 SENSITIVE Sensitive     CEFTRIAXONE <=0.25 SENSITIVE Sensitive     CIPROFLOXACIN  <=0.25 SENSITIVE Sensitive     GENTAMICIN <=1 SENSITIVE Sensitive     IMIPENEM 1 SENSITIVE Sensitive     TRIMETH/SULFA <=20 SENSITIVE Sensitive     PIP/TAZO <=4 SENSITIVE Sensitive     * FEW ENTEROBACTER AEROGENES   Morganella morganii - MIC*    AMPICILLIN >=32 RESISTANT Resistant     CEFAZOLIN  >=64 RESISTANT Resistant     CEFTAZIDIME <=1 SENSITIVE Sensitive     CIPROFLOXACIN  <=0.25 SENSITIVE Sensitive     GENTAMICIN <=1 SENSITIVE Sensitive     IMIPENEM 2 SENSITIVE Sensitive     TRIMETH/SULFA <=20 SENSITIVE Sensitive     AMPICILLIN/SULBACTAM 8 SENSITIVE Sensitive     PIP/TAZO <=4 SENSITIVE Sensitive     * FEW MORGANELLA MORGANII    Labs: CBC: Recent Labs  Lab 08/18/23 1204 08/18/23 1213  WBC  --  4.9  NEUTROABS  --  2.5  HGB 13.6 13.5  HCT 40.0 40.4  MCV  --  88.8  PLT  --  322   Basic Metabolic Panel: Recent Labs  Lab 08/18/23 1204 08/18/23 1217 08/18/23 2251 08/21/23 0429  NA 142 138  --  139  K 4.3 4.3  --  4.2  CL 107 104  --   107  CO2  --  25  --  24  GLUCOSE 85 88  --  130*  BUN 13 12  --  22  CREATININE 1.40* 1.25*  --  1.36*  CALCIUM   --  9.7  --  8.6*  MG  --   --  2.2  --    Liver Function Tests: Recent Labs  Lab 08/18/23 1217  AST 20  ALT 15  ALKPHOS 105  BILITOT 0.6  PROT 7.6  ALBUMIN  3.7   CBG: Recent Labs  Lab 08/18/23 1151  GLUCAP 73    Discharge time spent: 37 minutes.   Signed: Feliciana Horn, MD [facility] 08/22/2023

## 2023-08-22 NOTE — Progress Notes (Signed)
Inpatient Rehab Admissions Coordinator:  Await bed availability. Will continue to follow.  Wolfgang Phoenix, MS, CCC-SLP Admissions Coordinator [phone]

## 2023-08-22 NOTE — Progress Notes (Signed)
 Occupational Therapy Treatment Patient Details Name: Amy Hooper MRN: [id number] DOB: 1950-05-20 Today's Date: 08/22/2023   History of present illness 73 y.o. female presenting 5/29 to the emergency department with weakness.  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 PMH  hypertension, hyperlipidemia   OT comments  Patient continues to progress towards goals. Session focus on assessment of strength in R hand and patient asking to work on peri-care in session. Patient able to complete ADLs at [facility], with compensatory strategies provided to assist with peri-care thoroughness. Patient with improved grasp in session, with [facility] activities provided, as well as blue foam block to work on overall strength. Recommendation remains approprate; OT will continue to follow.       If plan is discharge home, recommend the following:  Assistance with cooking/housework;Assist for transportation   Equipment Recommendations  Other (comment);Tub/shower seat (RW)    Recommendations for Other Services      Precautions / Restrictions Precautions Precautions: Fall Recall of Precautions/Restrictions: Intact Restrictions Weight Bearing Restrictions Per Provider Order: No       Mobility Bed Mobility Overal bed mobility: Needs Assistance Bed Mobility: Supine to Sit       Sit to supine: Contact guard assist   General bed mobility comments: minimal increased time    Transfers Overall transfer level: Needs assistance Equipment used: Rolling walker (2 wheels), None Transfers: Sit to/from Stand Sit to Stand: Contact guard assist           General transfer comment: CGA with RW     Balance Overall balance assessment: Needs assistance Sitting-balance support: No upper extremity supported, Feet supported Sitting balance-Leahy Scale: Good     Standing balance support: Bilateral upper extremity supported,  During functional activity, Reliant on assistive device for balance Standing balance-Leahy Scale: Poor Standing balance comment: RW for support, external support required.                           ADL either performed or assessed with clinical judgement   ADL Overall ADL's : Needs assistance/impaired     Grooming: Standing;Supervision/safety       Lower Body Bathing: Sit to/from stand;Contact guard assist Lower Body Bathing Details (indicate cue type and reason): working on peri-care in session         Toilet Transfer: Contact guard assist;Rolling walker (2 wheels);Ambulation   Toileting- Clothing Manipulation and Hygiene: Contact guard assist;Sit to/from stand       Functional mobility during ADLs: Contact guard assist;Rolling walker (2 wheels) General ADL Comments: Patient continues to progress towards goals. Session focus on assessment of strength in R hand and patient asking to work on peri-care in session. Patient able to complete ADLs at [facility], with compensatory strategies provided to assist with peri-care thoroughness. Patient with improved grasp in session, with [facility] activities provided, as well as blue foam block to work on overall strength. Recommendation remains approprate; OT will continue to follow.    Extremity/Trunk Assessment Upper Extremity Assessment Upper Extremity Assessment: RUE deficits/detail RUE Deficits / Details: grasp is improving, provided a blue foam block to work with to increase strength RUE Sensation: WNL RUE Coordination: decreased fine motor            Vision       Perception     Praxis     Communication Communication Communication: No apparent difficulties   Cognition Arousal: Alert  Behavior During Therapy: WFL for tasks assessed/performed Cognition: Cognition impaired             OT - Cognition Comments: Reports her recall and memory are slower than usual                 Following commands: Intact         Cueing   Cueing Techniques: Verbal cues  Exercises      Shoulder Instructions       General Comments VSS    Pertinent Vitals/ Pain       Pain Assessment Pain Assessment: Faces Faces Pain Scale: Hurts little more Pain Location: headache Pain Descriptors / Indicators: Headache Pain Intervention(s): Limited activity within patient's tolerance, Monitored during session, Repositioned  Home Living                                          Prior Functioning/Environment              Frequency  Min 2X/week        Progress Toward Goals  OT Goals(current goals can now be found in the care plan section)  Progress towards OT goals: Progressing toward goals  Acute Rehab OT Goals Patient Stated Goal: to get better OT Goal Formulation: With patient Time For Goal Achievement: 09/02/23 Potential to Achieve Goals: Good  Plan      Co-evaluation                 AM-PAC OT "6 Clicks" Daily Activity     Outcome Measure   Help from another person eating meals?: None Help from another person taking care of personal grooming?: A Little Help from another person toileting, which includes using toliet, bedpan, or urinal?: A Little Help from another person bathing (including washing, rinsing, drying)?: A Little Help from another person to put on and taking off regular upper body clothing?: A Little Help from another person to put on and taking off regular lower body clothing?: A Little 6 Click Score: 19    End of Session Equipment Utilized During Treatment: Gait belt;Rolling walker (2 wheels)  OT Visit Diagnosis: Unsteadiness on feet (R26.81);Other abnormalities of gait and mobility (R26.89);Muscle weakness (generalized) (M62.81)   Activity Tolerance Patient tolerated treatment well   Patient Left in chair;with call bell/phone within reach;Other (comment) (PA present)   Nurse Communication Mobility status        Time: 1027-1100 OT Time  Calculation (min): 33 min  Charges: OT General Charges $OT Visit: 1 Visit OT Treatments $Self Care/Home Management : 23-37 mins  Amy Hooper, OTR/L Acute Rehabilitation Services [phone]   Amy Hooper 08/22/2023, 11:53 AM

## 2023-08-22 NOTE — Progress Notes (Signed)
 Physical Therapy Treatment Patient Details Name: Amy Hooper MRN: [id number] DOB: 1950/03/29 Today's Date: 08/22/2023   History of Present Illness 73 y.o. female presenting 5/29 to the emergency department with weakness.  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 PMH  hypertension, hyperlipidemia    PT Comments  Patient relates headache improved after her nap, but still hoping to have BM.  She was able to ambulate with RW and CGA though noting R foot scissoring at times and focus of session on Neuro re-education with activities at wall rail in hallway.  She tolerated well, though fatigues quickly and returned to room to bathroom.  She remains appropriate for inpatient rehab (>3 hours/day) prior to d/c home for progression of activity tolerance, balance, safety and R LE coordination/strength.    If plan is discharge home, recommend the following: A little help with walking and/or transfers;Help with stairs or ramp for entrance;Assist for transportation;Assistance with cooking/housework   Can travel by private Automotive [employer] (2 wheels);BSC/3in1    Recommendations for Other Services       Precautions / Restrictions Precautions Precautions: Fall     Mobility  Bed Mobility Overal bed mobility: Needs Assistance Bed Mobility: Supine to Sit     Supine to sit: Supervision, HOB elevated     General bed mobility comments: increased time, HOB up and A for safety    Transfers Overall transfer level: Needs assistance Equipment used: Rolling walker (2 wheels) Transfers: Sit to/from Stand Sit to Stand: Contact guard assist           General transfer comment: CGA with RW    Ambulation/Gait Ambulation/Gait assistance: Contact guard assist Gait Distance (Feet): 225 Feet Assistive device: Rolling walker (2 wheels) Gait Pattern/deviations: Step-to  pattern, Decreased stride length, Decreased step length - right, Decreased dorsiflexion - right, Ataxic, Step-through pattern, Scissoring       General Gait Details: R foot consistently moving medially with ambulation, increased time for turning and decreased foot clearance on R with fall risk   Stairs             Wheelchair Mobility     Tilt Bed    Modified Rankin (Stroke Patients Only) Modified Rankin (Stroke Patients Only) Pre-Morbid Rankin Score: No symptoms Modified Rankin: Moderately severe disability     Balance Overall balance assessment: Needs assistance   Sitting balance-Leahy Scale: Good     Standing balance support: Bilateral upper extremity supported Standing balance-Leahy Scale: Poor Standing balance comment: RW for support               High Level Balance Comments: side stepping at rail in hallway and then crossing over in front, pt relates R knee buckling and noted difficulty with clearing R foot when L stepping over; forward tandem gait with rail and hand hold A then forward on tip toes with rail and hand hold A.            Communication Communication Communication: No apparent difficulties  Cognition Arousal: Alert Behavior During Therapy: WFL for tasks assessed/performed   PT - Cognitive impairments: No apparent impairments                         Following commands: Intact      Cueing Cueing Techniques: Verbal cues  Exercises      General  Comments        Pertinent Vitals/Pain Pain Assessment Faces Pain Scale: Hurts a little bit Pain Location: headache Pain Descriptors / Indicators: Headache Pain Intervention(s): Monitored during session    Home Living                          Prior Function            PT Goals (current goals can now be found in the care plan section) Progress towards PT goals: Progressing toward goals    Frequency    Min 3X/week      PT Plan      Co-evaluation               AM-PAC PT "6 Clicks" Mobility   Outcome Measure  Help needed turning from your back to your side while in a flat bed without using bedrails?: A Little Help needed moving from lying on your back to sitting on the side of a flat bed without using bedrails?: A Little Help needed moving to and from a bed to a chair (including a wheelchair)?: A Little Help needed standing up from a chair using your arms (e.g., wheelchair or bedside chair)?: A Little Help needed to walk in hospital room?: A Little Help needed climbing 3-5 steps with a railing? : A Lot 6 Click Score: 17    End of Session Equipment Utilized During Treatment: Gait belt Activity Tolerance: Patient tolerated treatment well Patient left: Other (comment) (in bathroom with pull cord in reach and RN Aware)   PT Visit Diagnosis: Hemiplegia and hemiparesis;Other abnormalities of gait and mobility (R26.89);Ataxic gait (R26.0) Hemiplegia - Right/Left: Right Hemiplegia - dominant/non-dominant: Dominant Hemiplegia - caused by: Other cerebrovascular disease     Time: 1508-1527 PT Time Calculation (min) (ACUTE ONLY): 19 min  Charges:    $Gait Training: 8-22 mins PT General Charges $$ ACUTE PT VISIT: 1 Visit                     Abigail Hoff, PT Acute Rehabilitation Services Office:[phone] 08/22/2023    Amy Hooper 08/22/2023, 7:04 PM

## 2023-08-22 NOTE — Plan of Care (Signed)

## 2023-08-22 NOTE — Consult Note (Signed)
 ELECTROPHYSIOLOGY CONSULT NOTE  Patient ID: Amy Hooper MRN: [id number], DOB/AGE: 1950-10-13   Admit date: 08/18/2023 Date of Consult: 08/23/23  Primary Physician: Annella Kief, NP Primary Cardiologist: Dr. Larance Plater Reason for Consultation: Cryptogenic stroke ; recommendations regarding Implantable Loop Recorder, requested by Dr. Christiane Cowing  History of Present Illness Amy Hooper was admitted on 08/18/2023 with R sided weakness, found with stroke.    PMHx includes: DM, HTN, HLD, morbid obesity, CKD (IIIa), depression, fibromyalgia  Referred to Dr. 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  she has undergone workup for stroke including echocardiogram and carotid angio The patient has been monitored on telemetry which has demonstrated sinus rhythm with no arrhythmias.    Echocardiogram this admission demonstrated .   1. Left ventricular ejection fraction, by estimation, is 65 to 70%. The  left ventricle has normal function. The left ventricle has no regional  wall motion abnormalities. There is mild left ventricular hypertrophy.  Left ventricular diastolic parameters  are consistent with Grade I diastolic dysfunction (impaired relaxation).   2. Right ventricular systolic function is normal. The right ventricular  size is normal. There is normal pulmonary artery systolic pressure.   3. Left atrial size was moderately dilated.   4. The mitral valve is normal in structure. Trivial mitral valve  regurgitation. No evidence of mitral stenosis.   5. The aortic valve is tricuspid. Aortic valve regurgitation is not  visualized. No  aortic stenosis is present.   6. The inferior vena cava is normal in size with greater than 50%  respiratory variability, suggesting right atrial pressure of 3 mmHg.    Lab work is reviewed.   Prior to admission, the patient denies chest pain, shortness of breath, dizziness, palpitations, or syncope.  They are recovering from their stroke with plans to CIR at discharge.    Past Medical History:  Diagnosis Date   Allergy 1976   Tramadol  NSAIDS   Chronic kidney disease    Depression    Fibromyalgia    Gout    HTN (hypertension)    Hyperlipidemia 09/01/22   Insomnia    Morbid obesity (HCC)    Non-recurrent acute suppurative otitis media of left ear without spontaneous rupture of tympanic membrane 04/30/2022   Osteoarthritis    Palpitation    Sleep apnea      Surgical History:  Past Surgical History:  Procedure Laterality Date   APPLICATION OF WOUND VAC N/A 03/25/2021   Procedure: APPLICATION OF WOUND VAC;  Surgeon: Adah Acron, MD;  Location: [facility] OR;  Service: Orthopedics;  Laterality: N/A;   APPLICATION OF WOUND VAC N/A 03/30/2021   Procedure: WOUND VAC CHANGE 12x6x5;  Surgeon: Adah Acron, MD;  Location: [facility] OR;  Service: Orthopedics;  Laterality: N/A;   INCISION AND DRAINAGE OF WOUND N/A 03/25/2021   Procedure: LUMBAR POST OP INCISION IRRIGATION;  Surgeon: Adah Acron, MD;  Location: [facility] OR;  Service: Orthopedics;  Laterality: N/A;   JOINT REPLACEMENT     KNEE ARTHROSCOPY     LUMBAR WOUND DEBRIDEMENT N/A 03/30/2021   Procedure: REPEAT LUMBAR WOUND DEBRIDEMENT;  Surgeon: Adah Acron, MD;  Location: [facility] OR;  Service: Orthopedics;  Laterality: N/A;   right rotator cuff     ROTATOR CUFF REPAIR Left    TOTAL KNEE ARTHROPLASTY Left 09/08/2020   Procedure: LEFT TOTAL KNEE ARTHROPLASTY;  Surgeon: Wes Hamman, MD;  Location: [facility] OR;  Service: Orthopedics;  Laterality: Left;   TUBAL LIGATION       Facility-Administered Medications Prior to Admission  Medication Dose Route  Frequency Provider Last Rate Last Admin   capsaicin  topical system 8 % patch 1 patch  1 patch Topical Once Raulkar, Krutika P, MD       Medications Prior to Admission  Medication Sig Dispense Refill Last Dose/Taking   allopurinol  (ZYLOPRIM ) 100 MG tablet Take 2 tablets (200 mg total) by mouth daily. 180 tablet 3 08/18/2023 Morning   amLODipine  (NORVASC ) 5 MG tablet Take 1 tablet (5 mg total) by mouth daily. 90 tablet 3 08/18/2023 Morning   dapagliflozin  propanediol (FARXIGA ) 5 MG TABS tablet Take 1 tablet (5 mg total) by mouth daily. 90 tablet 1 08/18/2023 Morning   DULoxetine  (CYMBALTA ) 20 MG capsule TAKE 1 CAPSULE BY MOUTH DAILY (Patient taking differently: Take 20 mg by mouth every evening.) 90 capsule 3 08/17/2023 Evening   eszopiclone  (LUNESTA ) 1 MG TABS tablet Take 1 tablet (1 mg total) by mouth at bedtime as needed for sleep. Take immediately before bedtime (Patient taking differently: Take 1 mg by mouth at bedtime.) 90 tablet 1 08/17/2023 Bedtime   fluticasone  (FLONASE ) 50 MCG/ACT nasal spray Place 1 spray into both nostrils daily as needed for allergies.   Past Month   HYDROcodone -acetaminophen  (NORCO) 7.5-325 MG tablet Take 1 tablet by mouth 3 (three) times daily as needed for moderate pain (pain score 4-6). 90 tablet 0 08/17/2023 Evening   hydrocortisone  (ANUSOL -HC) 2.5 % rectal cream PLACE 1 APPLICATION RECTALLY 2 (TWO) TIMES DAILY. 30 g 3 Past Month   ketoconazole  (NIZORAL ) 2 % cream Apply 1 Application topically daily. Apply to rash twice a day. If no improvement after 2 weeks, please let provider know. 60 g 0 Past Month   loratadine  (CLARITIN ) 10 MG tablet Take 10 mg by mouth daily as needed for allergies.   Past Week   losartan  (COZAAR ) 50 MG tablet Take 1 tablet (50 mg total) by mouth daily. 90 tablet 3 08/18/2023 Morning   mometasone  (ELOCON ) 0.1 % cream Apply very thin layer to rash at bedtime. May use one other time during the day if needed. Stop after 7 days, if still needed may repeat  after 3 day break. 45 g 1 Past Month   pregabalin  (LYRICA ) 75 MG capsule Take 1 capsule (75 mg total) by mouth 2 (two) times daily. 180 capsule 1 08/18/2023 Morning   Vitamin D , Ergocalciferol , (DRISDOL ) 1.25 MG (50000 UNIT) CAPS capsule Take 1 capsule (50,000 Units total) by mouth every 7 (seven) days. 12 capsule 1 08/14/2023    Inpatient Medications:   allopurinol   200 mg Oral Daily   amLODipine   2.5 mg Oral Daily   aspirin  EC  81 mg Oral Daily   clopidogrel   75 mg Oral Daily   dapagliflozin  propanediol  5 mg Oral Daily   DULoxetine   20 mg Oral QPM   enoxaparin  (LOVENOX ) injection  40 mg Subcutaneous Q24H   losartan   50 mg Oral Daily   pregabalin   75 mg Oral BID   rosuvastatin   20 mg Oral Daily   zolpidem   5 mg Oral QHS    Allergies:  Allergies  Allergen Reactions   Cefuroxime Hives and  Rash    Other reaction(s): Unknown  Other Reaction(s): Unknown   Cefuroxime Axetil Rash and Hives   Clarithromycin Hives and Rash    Other reaction(s): Unknown  Other Reaction(s): Unknown   Nsaids Nausea And Vomiting    Other reaction(s): Unknown  Other Reaction(s): Unknown   Hydrocodone      Other reaction(s): Unknown  Other Reaction(s): Unknown   Hydrocodone -Acetaminophen  Other (See Comments)    Stomach upset  Other Reaction(s): Other  Stomach upset    Stomach upset,  Stomach upset    Stomach upset,     Stomach upset  Stomach upset,   Other Other (See Comments) and Nausea And Vomiting    Upset stomach  Other Reaction(s): Other (See Comments), Unknown  Upset stomach    Upset stomach Other reaction(s): Unknown   Oxycodone -Acetaminophen  Other (See Comments)    Other reaction(s): Unknown  Other Reaction(s): Other, Unknown  Hallucination   Robaxin  [Methocarbamol ] Nausea And Vomiting    "It tears my stomach up."   Toradol  [Ketorolac  Tromethamine ] Nausea And Vomiting   Tramadol  Nausea And Vomiting and Other (See Comments)    Upset stomach  Other reaction(s):  Unknown  Other Reaction(s): Other, Unknown  Upset stomach    Upset stomach Other reaction(s): Unknown    Social History   Socioeconomic History   Marital status: Married    Spouse name: Not on file   Number of children: 3   Years of education: Not on file   Highest education level: Bachelor's degree (e.g., BA, AB, BS)  Occupational History   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     Social Drivers of Corporate investment banker Strain: Low Risk  (07/25/2023)   Overall Financial Resource Strain (CARDIA)    Difficulty of Paying Living Expenses: Not hard at all  Food Insecurity: No Food Insecurity (08/18/2023)   Hunger Vital Sign    Worried About Running Out of Food in the Last Year: Never true    Ran Out of Food in the Last Year: Never true  Transportation Needs: No Transportation Needs (08/18/2023)   PRAPARE - Administrator, Civil Service (Medical): No    Lack of Transportation (Non-Medical): No  Physical Activity: Inactive (07/25/2023)   Exercise Vital Sign    Days of Exercise per Week: 0 days    Minutes of Exercise per Session: 20 min  Stress: Stress Concern Present (07/25/2023)   [employer] of Occupational Health - Occupational Stress Questionnaire    Feeling of Stress : Rather much  Social Connections: Moderately Integrated (08/18/2023)   Social Connection and Isolation Panel [NHANES]    Frequency of Communication with Friends and Family: Three times a week    Frequency of Social Gatherings with Friends and  Family: Twice a week    Attends Religious Services: Never    Database administrator or Organizations: Yes    Attends Banker Meetings: Never    Marital Status: Married  Recent Concern: Social Connections - Moderately Isolated (07/25/2023)   Social Connection and Isolation Panel [NHANES]    Frequency of Communication with Friends and Family: Three times a week    Frequency of Social Gatherings with Friends and Family: Once a week    Attends Religious Services: Never    Database administrator or Organizations: No    Attends Banker Meetings: Never    Marital Status: Married  Catering manager Violence: Not At Risk (08/18/2023)   Humiliation, Afraid, Rape, and Kick questionnaire    Fear of Current or Ex-Partner: No    Emotionally Abused: No    Physically Abused: No    Sexually Abused: No     Family History  Problem Relation Age of Onset   Hypothyroidism Mother    Hyperlipidemia Mother    Miscarriages / [place] Mother    Hyperlipidemia Other    Kidney failure Father    Dementia Father    Gout Father    Arthritis Father    Prostate cancer Father    Heart disease Father    Hypertension Father    Kidney disease Father    Stroke Father       Review of Systems: All other systems reviewed and are otherwise negative except as noted above.  Physical Exam: Vitals:   08/21/23 2335 08/22/23 0300 08/22/23 0510 08/22/23 0816  BP: (!) 107/52 116/61  118/63  Pulse: 63 (!) 51  (!) 54  Resp: 14   12  Temp: 98.3 F (36.8 C) 98.2 F (36.8 C)  97.8 F (36.6 C)  TempSrc: Oral Oral  Oral  SpO2: 91% 94%  99%  Weight:   92.2 kg   Height:        GEN- The patient is well appearing, alert and oriented x 3 today.   Head- normocephalic, atraumatic Eyes-  Sclera clear, conjunctiva pink Ears- hearing intact Oropharynx- clear Neck- supple Lungs- Clear to ausculation bilaterally, normal work of breathing Heart- Regular rate and rhythm, no murmurs, rubs or gallops   GI- soft, NT, ND Extremities- no clubbing, cyanosis, or edema MS- no significant deformity or atrophy Skin- no rash or lesion Psych- euthymic mood, full affect   Labs:   Lab Results  Component Value Date   WBC 4.9 08/18/2023   HGB 13.5 08/18/2023   HCT 40.4 08/18/2023   MCV 88.8 08/18/2023   PLT 322 08/18/2023    Recent Labs  Lab 08/18/23 1217 08/21/23 0429  NA 138 139  K 4.3 4.2  CL 104 107  CO2 25 24  BUN 12 22  CREATININE 1.25* 1.36*  CALCIUM  9.7 8.6*  PROT 7.6  --   BILITOT 0.6  --   ALKPHOS 105  --   ALT 15  --   AST 20  --   GLUCOSE 88 130*   Lab Results  Component Value Date   CKTOTAL 46 11/03/2021   Lab Results  Component Value Date   CHOL 226 (H) 08/18/2023   CHOL 239 (H) 04/25/2023   CHOL 235 (H) 07/23/2022   Lab Results  Component Value Date   HDL 78 08/18/2023   HDL 71 04/25/2023   HDL 69 07/23/2022   Lab Results  Component Value Date   LDLCALC 136 (H) 08/18/2023   LDLCALC 155 (H) 04/25/2023   LDLCALC 148 (H) 07/23/2022   Lab Results  Component Value Date   TRIG 62 08/18/2023   TRIG 78 04/25/2023   TRIG 101 07/23/2022   Lab  Results  Component Value Date   CHOLHDL 2.9 08/18/2023   CHOLHDL 3.4 04/25/2023   CHOLHDL 3.4 07/23/2022   No results found for: "LDLDIRECT"  No results found for: "DDIMER"   Radiology/Studies:   VAS US  LOWER EXTREMITY VENOUS (DVT) Result Date: 08/19/2023  Lower Venous DVT Study Patient Name:  ENGLISH TOMER  Date of Exam:   08/19/2023 Medical Rec #: [id number]              Accession #:    [id number] Date of Birth: [birth year]              Patient Gender: F Patient Age:   28 years Exam Location:  [facility] Procedure:      VAS US  LOWER EXTREMITY VENOUS (DVT) Referring Phys: Fraser Jackson XU --------------------------------------------------------------------------------  Indications: Embolic stroke.  Performing Technologist: Franky Ivanoff Sturdivant-Jones RDMS, RVT  Examination Guidelines: A complete evaluation  includes B-mode imaging, spectral Doppler, color Doppler, and power Doppler as needed of all accessible portions of each vessel. Bilateral testing is considered an integral part of a complete examination. Limited examinations for reoccurring indications may be performed as noted. The reflux portion of the exam is performed with the patient in reverse Trendelenburg.       Summary: BILATERAL: - No evidence of deep vein thrombosis seen in the lower extremities, bilaterally. -No evidence of popliteal cyst, bilaterally.   *See table(s) above for measurements and observations. Electronically signed by Jimmye Moulds MD on 08/19/2023 at 5:09:13 PM.    Final    MR BRAIN WO CONTRAST Result Date: 08/18/2023 CLINICAL DATA:  Stroke follow-up EXAM: MRI HEAD WITHOUT CONTRAST TECHNIQUE: Multiplanar, multiecho pulse sequences of the brain and surrounding structures were obtained without intravenous contrast. COMPARISON:  None Available. FINDINGS: Brain: Small region of acute ischemia within the posterior paramedian left frontal lobe. No acute or chronic hemorrhage. There is multifocal hyperintense T2-weighted signal within the white matter. Parenchymal volume and CSF spaces are normal. The midline structures are normal. Vascular: Normal flow voids. Skull and upper cervical spine: Normal calvarium and skull base. Visualized upper cervical spine and soft tissues are normal. Sinuses/Orbits:No paranasal sinus fluid levels or advanced mucosal thickening. No mastoid or middle ear effusion. Normal orbits. IMPRESSION: Small region of acute ischemia within the posterior paramedian left frontal lobe. No hemorrhage or mass effect. Electronically Signed   By: Juanetta Nordmann M.D.   On: 08/18/2023 23:05   CT ANGIO HEAD NECK W WO CM Addendum Date: 08/18/2023 ADDENDUM REPORT: 08/18/2023 13:52 ADDENDUM: These results were called by telephone at the time of interpretation on 08/18/2023 at 1:52 pm to provider Hiawatha Lout , who verbally  acknowledged these results. Electronically Signed   By: Denny Flack M.D.   On: 08/18/2023 13:52   Result Date: 08/18/2023 CLINICAL DATA:  Neuro deficit, concern for stroke. Weakness and right leg and arm with tingling in right leg. Gait abnormality due to weakness. EXAM: CT ANGIOGRAPHY HEAD AND NECK WITH AND WITHOUT CONTRAST TECHNIQUE: Multidetector CT imaging of the head and neck was performed using the standard protocol during bolus administration of intravenous contrast. Multiplanar CT image reconstructions and MIPs were obtained to evaluate the vascular anatomy. Carotid stenosis measurements (when applicable) are obtained utilizing NASCET criteria, using the distal internal carotid diameter as the denominator. RADIATION DOSE REDUCTION: This exam was performed according to the departmental dose-optimization program which includes automated exposure control, adjustment of the mA and/or kV according to patient size and/or use of iterative reconstruction technique. CONTRAST:  75mL  OMNIPAQUE  IOHEXOL  350 MG/ML SOLN COMPARISON:  None Available. FINDINGS: CT HEAD FINDINGS Brain: No acute intracranial hemorrhage. No CT evidence of acute infarct. No edema, mass effect, or midline shift. The basilar cisterns are patent. Ventricles: Ventricles are normal in size and configuration. Vascular: No hyperdense vessel. Intracranial atherosclerotic calcifications noted. Skull: No acute or aggressive finding. Sinuses/orbits: Mild mucosal thickening in the ethmoid sinuses. Other: Mastoid air cells are clear. CTA NECK FINDINGS Aortic arch: Common origin of the brachiocephalic and left common carotid arteries. Imaged portion shows no evidence of aneurysm or dissection. Mild atherosclerosis. No significant stenosis of the major arch vessel origins. Pulmonary arteries: As permitted by contrast timing, there are no filling defects in the visualized pulmonary arteries. Subclavian arteries: The subclavian arteries are patent  bilaterally. Right carotid system: Patent. Mild atherosclerosis at the carotid bifurcation without hemodynamically significant stenosis. No evidence of dissection. Left carotid system: Patent. Mild atherosclerosis at the carotid bifurcation without hemodynamically significant stenosis. No evidence of dissection. Vertebral arteries: Codominant. No evidence of dissection, stenosis (50% or greater), or occlusion. Mild tortuosity of the V1 and V2 segments. Skeleton: No acute findings. Degenerative changes in the cervical spine. Other neck: The visualized airway is patent. No cervical lymphadenopathy. Upper chest: Visualized lung apices are clear. Review of the MIP images confirms the above findings CTA HEAD FINDINGS ANTERIOR CIRCULATION: The intracranial ICAs are patent bilaterally. Mild atherosclerosis of the carotid siphons. No significant stenosis, proximal occlusion, aneurysm, or vascular malformation. MCAs: The middle cerebral arteries are patent bilaterally. ACAs: The right ACA is patent. The A1 and A2 segments are patent on the left. There is occlusion of the A3 and proximal A4 segments of the left ACA with reconstitution of the left A4 segment over the anterior body of the corpus callosum. POSTERIOR CIRCULATION: No significant stenosis, proximal occlusion, aneurysm, or vascular malformation. PCAs: The posterior cerebral arteries are patent bilaterally. Pcomm: The posterior communicating arteries are visualized bilaterally. SCAs: The superior cerebellar arteries are patent bilaterally. Basilar artery: Patent AICAs: Patent PICAs: Patent Vertebral arteries: The intracranial vertebral arteries are patent. Venous sinuses: As permitted by contrast timing, patent. 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 There is reconstitution of the left A4 segment over the anterior body of the corpus callosum  with diminutive caliber. Intracranial arterial vasculature is otherwise patent. Mild atherosclerosis as above.  No high-grade stenosis. No acute intracranial hemorrhage. Aortic Atherosclerosis (ICD10-I70.0). Electronically Signed: By: Denny Flack M.D. On: 08/18/2023 13:46    12-lead ECG SR, PACs All prior EKG's in EPIC reviewed with no documented atrial fibrillation  Telemetry SR, frequent PACs, no AFib  Assessment and Plan:  1. Cryptogenic stroke  The patient presents with cryptogenic stroke.  I spoke at length with the patient about monitoring for afib with either a 30 day event monitor or an implantable loop recorder.  Risks, benefits, and alteratives to implantable loop recorder were discussed with the patient today.   At this time, the patient is very clear in their decision to proceed with implantable loop recorder.   Wound care was reviewed with the patient (keep incision clean and dry for 3 days).    Please call with questions.   Alyshia Kernan Randol Butt, PA-C 08/22/2023

## 2023-08-23 ENCOUNTER — Encounter (HOSPITAL_COMMUNITY): Payer: Self-pay | Admitting: Cardiovascular Disease

## 2023-08-23 ENCOUNTER — Other Ambulatory Visit: Payer: Self-pay

## 2023-08-23 ENCOUNTER — Encounter (HOSPITAL_COMMUNITY)
Admission: EM | Disposition: A | Discharge status: Rehabilitation Facility | Payer: Self-pay | Source: Home / Self Care | Attending: Internal Medicine

## 2023-08-23 ENCOUNTER — Inpatient Hospital Stay (HOSPITAL_COMMUNITY)
Admission: AD | Admit: 2023-08-23 | Discharge: 2023-08-27 | DRG: 057 | Disposition: A | Discharge status: Home / Self Care | Source: Intra-hospital | Attending: Physical Medicine and Rehabilitation | Admitting: Physical Medicine and Rehabilitation

## 2023-08-23 DIAGNOSIS — Z96652 Presence of left artificial knee joint: Secondary | ICD-10-CM | POA: Diagnosis present

## 2023-08-23 DIAGNOSIS — Z885 Allergy status to narcotic agent status: Secondary | ICD-10-CM

## 2023-08-23 DIAGNOSIS — R000 Tachycardia, unspecified: Secondary | ICD-10-CM | POA: Diagnosis present

## 2023-08-23 DIAGNOSIS — E1122 Type 2 diabetes mellitus with diabetic chronic kidney disease: Secondary | ICD-10-CM | POA: Diagnosis present

## 2023-08-23 DIAGNOSIS — M797 Fibromyalgia: Secondary | ICD-10-CM | POA: Diagnosis present

## 2023-08-23 DIAGNOSIS — N1832 Chronic kidney disease, stage 3b: Secondary | ICD-10-CM | POA: Diagnosis present

## 2023-08-23 DIAGNOSIS — N183 Chronic kidney disease, stage 3 unspecified: Secondary | ICD-10-CM | POA: Diagnosis present

## 2023-08-23 DIAGNOSIS — Z841 Family history of disorders of kidney and ureter: Secondary | ICD-10-CM

## 2023-08-23 DIAGNOSIS — N1831 Chronic kidney disease, stage 3a: Secondary | ICD-10-CM | POA: Diagnosis not present

## 2023-08-23 DIAGNOSIS — Z8042 Family history of malignant neoplasm of prostate: Secondary | ICD-10-CM

## 2023-08-23 DIAGNOSIS — Z886 Allergy status to analgesic agent status: Secondary | ICD-10-CM

## 2023-08-23 DIAGNOSIS — G4700 Insomnia, unspecified: Secondary | ICD-10-CM | POA: Diagnosis present

## 2023-08-23 DIAGNOSIS — I63522 Cerebral infarction due to unspecified occlusion or stenosis of left anterior cerebral artery: Principal | ICD-10-CM | POA: Diagnosis present

## 2023-08-23 DIAGNOSIS — G4733 Obstructive sleep apnea (adult) (pediatric): Secondary | ICD-10-CM | POA: Diagnosis present

## 2023-08-23 DIAGNOSIS — I639 Cerebral infarction, unspecified: Secondary | ICD-10-CM

## 2023-08-23 DIAGNOSIS — K219 Gastro-esophageal reflux disease without esophagitis: Secondary | ICD-10-CM | POA: Diagnosis present

## 2023-08-23 DIAGNOSIS — M542 Cervicalgia: Secondary | ICD-10-CM | POA: Diagnosis present

## 2023-08-23 DIAGNOSIS — E785 Hyperlipidemia, unspecified: Secondary | ICD-10-CM | POA: Diagnosis present

## 2023-08-23 DIAGNOSIS — E1165 Type 2 diabetes mellitus with hyperglycemia: Secondary | ICD-10-CM | POA: Diagnosis present

## 2023-08-23 DIAGNOSIS — Z8261 Family history of arthritis: Secondary | ICD-10-CM

## 2023-08-23 DIAGNOSIS — Z823 Family history of stroke: Secondary | ICD-10-CM

## 2023-08-23 DIAGNOSIS — M7918 Myalgia, other site: Secondary | ICD-10-CM

## 2023-08-23 DIAGNOSIS — M109 Gout, unspecified: Secondary | ICD-10-CM | POA: Diagnosis present

## 2023-08-23 DIAGNOSIS — R42 Dizziness and giddiness: Secondary | ICD-10-CM | POA: Diagnosis present

## 2023-08-23 DIAGNOSIS — Z888 Allergy status to other drugs, medicaments and biological substances status: Secondary | ICD-10-CM

## 2023-08-23 DIAGNOSIS — R252 Cramp and spasm: Secondary | ICD-10-CM | POA: Diagnosis present

## 2023-08-23 DIAGNOSIS — Z8249 Family history of ischemic heart disease and other diseases of the circulatory system: Secondary | ICD-10-CM

## 2023-08-23 DIAGNOSIS — Z83438 Family history of other disorder of lipoprotein metabolism and other lipidemia: Secondary | ICD-10-CM

## 2023-08-23 DIAGNOSIS — I959 Hypotension, unspecified: Secondary | ICD-10-CM | POA: Diagnosis not present

## 2023-08-23 DIAGNOSIS — G8929 Other chronic pain: Secondary | ICD-10-CM | POA: Diagnosis present

## 2023-08-23 DIAGNOSIS — Z6837 Body mass index (BMI) 37.0-37.9, adult: Secondary | ICD-10-CM

## 2023-08-23 DIAGNOSIS — E66812 Obesity, class 2: Secondary | ICD-10-CM | POA: Diagnosis present

## 2023-08-23 DIAGNOSIS — M1711 Unilateral primary osteoarthritis, right knee: Secondary | ICD-10-CM | POA: Diagnosis present

## 2023-08-23 DIAGNOSIS — I129 Hypertensive chronic kidney disease with stage 1 through stage 4 chronic kidney disease, or unspecified chronic kidney disease: Secondary | ICD-10-CM | POA: Diagnosis present

## 2023-08-23 DIAGNOSIS — I69351 Hemiplegia and hemiparesis following cerebral infarction affecting right dominant side: Secondary | ICD-10-CM | POA: Diagnosis present

## 2023-08-23 DIAGNOSIS — Z79899 Other long term (current) drug therapy: Secondary | ICD-10-CM

## 2023-08-23 DIAGNOSIS — I63422 Cerebral infarction due to embolism of left anterior cerebral artery: Secondary | ICD-10-CM | POA: Diagnosis not present

## 2023-08-23 DIAGNOSIS — Z881 Allergy status to other antibiotic agents status: Secondary | ICD-10-CM

## 2023-08-23 DIAGNOSIS — I10 Essential (primary) hypertension: Secondary | ICD-10-CM | POA: Diagnosis not present

## 2023-08-23 DIAGNOSIS — E669 Obesity, unspecified: Secondary | ICD-10-CM | POA: Diagnosis not present

## 2023-08-23 DIAGNOSIS — M172 Bilateral post-traumatic osteoarthritis of knee: Secondary | ICD-10-CM | POA: Diagnosis present

## 2023-08-23 DIAGNOSIS — F419 Anxiety disorder, unspecified: Secondary | ICD-10-CM | POA: Diagnosis present

## 2023-08-23 HISTORY — PX: LOOP RECORDER INSERTION: EP1214

## 2023-08-23 HISTORY — DX: Cerebral infarction, unspecified: I63.9

## 2023-08-23 SURGERY — LOOP RECORDER INSERTION

## 2023-08-23 MED ORDER — LIDOCAINE-EPINEPHRINE 1 %-1:100000 IJ SOLN
INTRAMUSCULAR | Status: AC
  Filled 2023-08-23: qty 1

## 2023-08-23 MED ORDER — AMLODIPINE BESYLATE 2.5 MG PO TABS
2.5000 mg | ORAL_TABLET | Freq: Every day | ORAL | Status: DC
  Administered 2023-08-24: 2.5 mg via ORAL
  Filled 2023-08-23: qty 1

## 2023-08-23 MED ORDER — SENNOSIDES-DOCUSATE SODIUM 8.6-50 MG PO TABS: 2.0000 | ORAL_TABLET | Freq: Every evening | ORAL | Status: DC | PRN

## 2023-08-23 MED ORDER — CAPSAICIN 0.025 % EX CREA: TOPICAL_CREAM | Freq: Two times a day (BID) | CUTANEOUS | Status: DC | PRN

## 2023-08-23 MED ORDER — ASPIRIN 81 MG PO TBEC
81.0000 mg | DELAYED_RELEASE_TABLET | Freq: Every day | ORAL | Status: DC
  Administered 2023-08-24 – 2023-08-27 (×4): 81 mg via ORAL
  Filled 2023-08-23 (×4): qty 1

## 2023-08-23 MED ORDER — CLOPIDOGREL BISULFATE 75 MG PO TABS
75.0000 mg | ORAL_TABLET | Freq: Every day | ORAL | Status: DC
  Administered 2023-08-24 – 2023-08-27 (×4): 75 mg via ORAL
  Filled 2023-08-23 (×4): qty 1

## 2023-08-23 MED ORDER — LIDOCAINE-EPINEPHRINE 1 %-1:100000 IJ SOLN
INTRAMUSCULAR | Status: DC | PRN
Start: 2023-08-23 — End: 2023-08-23
  Administered 2023-08-23: 20 mL

## 2023-08-23 MED ORDER — LORATADINE 10 MG PO TABS: 10.0000 mg | ORAL_TABLET | Freq: Every day | ORAL | Status: DC | PRN

## 2023-08-23 MED ORDER — FLEET ENEMA RE ENEM
1.0000 | ENEMA | Freq: Once | RECTAL | Status: DC | PRN
Start: 2023-08-23 — End: 2023-08-27

## 2023-08-23 MED ORDER — PROCHLORPERAZINE MALEATE 5 MG PO TABS: 5.0000 mg | ORAL_TABLET | Freq: Four times a day (QID) | ORAL | Status: DC | PRN

## 2023-08-23 MED ORDER — DULOXETINE HCL 20 MG PO CPEP
20.0000 mg | ORAL_CAPSULE | Freq: Every evening | ORAL | Status: DC
  Administered 2023-08-23 – 2023-08-26 (×4): 20 mg via ORAL
  Filled 2023-08-23 (×4): qty 1

## 2023-08-23 MED ORDER — FLUTICASONE PROPIONATE 50 MCG/ACT NA SUSP
1.0000 | Freq: Every day | NASAL | Status: DC | PRN
Start: 2023-08-23 — End: 2023-08-27

## 2023-08-23 MED ORDER — POLYETHYLENE GLYCOL 3350 17 G PO PACK
17.0000 g | PACK | Freq: Every day | ORAL | Status: DC
  Administered 2023-08-24 – 2023-08-25 (×2): 17 g via ORAL
  Filled 2023-08-23 (×4): qty 1

## 2023-08-23 MED ORDER — MELATONIN 5 MG PO TABS: 5.0000 mg | ORAL_TABLET | Freq: Every evening | ORAL | Status: DC | PRN

## 2023-08-23 MED ORDER — POLYETHYLENE GLYCOL 3350 17 G PO PACK: 17.0000 g | PACK | Freq: Every day | ORAL | 0 refills | Status: AC

## 2023-08-23 MED ORDER — PROCHLORPERAZINE 25 MG RE SUPP: 12.5000 mg | Freq: Four times a day (QID) | RECTAL | Status: DC | PRN

## 2023-08-23 MED ORDER — ROSUVASTATIN CALCIUM 20 MG PO TABS
20.0000 mg | ORAL_TABLET | Freq: Every day | ORAL | Status: DC
  Administered 2023-08-24 – 2023-08-27 (×4): 20 mg via ORAL
  Filled 2023-08-23 (×4): qty 1

## 2023-08-23 MED ORDER — PREGABALIN 50 MG PO CAPS
75.0000 mg | ORAL_CAPSULE | Freq: Two times a day (BID) | ORAL | Status: DC
  Administered 2023-08-23 – 2023-08-27 (×8): 75 mg via ORAL
  Filled 2023-08-23 (×8): qty 1

## 2023-08-23 MED ORDER — ALLOPURINOL 100 MG PO TABS
200.0000 mg | ORAL_TABLET | Freq: Every day | ORAL | Status: DC
  Administered 2023-08-24 – 2023-08-27 (×4): 200 mg via ORAL
  Filled 2023-08-23 (×4): qty 2

## 2023-08-23 MED ORDER — ORAL CARE MOUTH RINSE: 15.0000 mL | OROMUCOSAL | Status: DC | PRN

## 2023-08-23 MED ORDER — ENOXAPARIN SODIUM 40 MG/0.4ML IJ SOSY: 40.0000 mg | PREFILLED_SYRINGE | INTRAMUSCULAR | Status: DC

## 2023-08-23 MED ORDER — CYCLOBENZAPRINE HCL 5 MG PO TABS
5.0000 mg | ORAL_TABLET | Freq: Every day | ORAL | Status: DC
  Administered 2023-08-23 – 2023-08-26 (×4): 5 mg via ORAL
  Filled 2023-08-23 (×4): qty 1

## 2023-08-23 MED ORDER — BUTALBITAL-APAP-CAFFEINE 50-325-40 MG PO TABS: 1.0000 | ORAL_TABLET | Freq: Four times a day (QID) | ORAL | Status: DC | PRN

## 2023-08-23 MED ORDER — GUAIFENESIN-DM 100-10 MG/5ML PO SYRP: 5.0000 mL | ORAL_SOLUTION | Freq: Four times a day (QID) | ORAL | Status: DC | PRN

## 2023-08-23 MED ORDER — ACETAMINOPHEN 325 MG PO TABS: 325.0000 mg | ORAL_TABLET | ORAL | Status: DC | PRN

## 2023-08-23 MED ORDER — CLOPIDOGREL BISULFATE 75 MG PO TABS: 75.0000 mg | ORAL_TABLET | Freq: Every day | ORAL | 0 refills | Status: DC

## 2023-08-23 MED ORDER — ALUM & MAG HYDROXIDE-SIMETH 200-200-20 MG/5ML PO SUSP: 30.0000 mL | ORAL | Status: DC | PRN

## 2023-08-23 MED ORDER — ENOXAPARIN SODIUM 40 MG/0.4ML IJ SOSY
40.0000 mg | PREFILLED_SYRINGE | INTRAMUSCULAR | Status: DC
  Administered 2023-08-23 – 2023-08-26 (×4): 40 mg via SUBCUTANEOUS
  Filled 2023-08-23 (×4): qty 0.4

## 2023-08-23 MED ORDER — BISACODYL 10 MG RE SUPP: 10.0000 mg | Freq: Every day | RECTAL | Status: DC | PRN

## 2023-08-23 MED ORDER — PROCHLORPERAZINE EDISYLATE 10 MG/2ML IJ SOLN: 5.0000 mg | Freq: Four times a day (QID) | INTRAMUSCULAR | Status: DC | PRN

## 2023-08-23 MED ORDER — SENNOSIDES-DOCUSATE SODIUM 8.6-50 MG PO TABS
2.0000 | ORAL_TABLET | Freq: Two times a day (BID) | ORAL | Status: DC
  Administered 2023-08-25 – 2023-08-27 (×3): 2 via ORAL
  Filled 2023-08-23 (×6): qty 2

## 2023-08-23 MED ORDER — HYDROCODONE-ACETAMINOPHEN 7.5-325 MG PO TABS
1.0000 | ORAL_TABLET | Freq: Three times a day (TID) | ORAL | Status: DC | PRN
  Administered 2023-08-23 – 2023-08-27 (×5): 1 via ORAL
  Filled 2023-08-23 (×5): qty 1

## 2023-08-23 MED ORDER — LOSARTAN POTASSIUM 50 MG PO TABS
50.0000 mg | ORAL_TABLET | Freq: Every day | ORAL | Status: DC
  Administered 2023-08-24 – 2023-08-27 (×4): 50 mg via ORAL
  Filled 2023-08-23 (×4): qty 1

## 2023-08-23 MED ORDER — DAPAGLIFLOZIN PROPANEDIOL 5 MG PO TABS
5.0000 mg | ORAL_TABLET | Freq: Every day | ORAL | Status: DC
  Administered 2023-08-24 – 2023-08-27 (×4): 5 mg via ORAL
  Filled 2023-08-23 (×4): qty 1

## 2023-08-23 MED ORDER — DIPHENHYDRAMINE HCL 25 MG PO CAPS: 25.0000 mg | ORAL_CAPSULE | Freq: Four times a day (QID) | ORAL | Status: DC | PRN

## 2023-08-23 SURGICAL SUPPLY — 2 items
MONITOR REVEAL LINQ II (Prosthesis & Implant Heart) IMPLANT
PACK LOOP INSERTION (CUSTOM PROCEDURE TRAY) ×1 IMPLANT

## 2023-08-23 NOTE — Progress Notes (Signed)
 PT Cancellation Note  Patient Details Name: Amy Hooper MRN: [id number] DOB: 1951/02/20   Cancelled Treatment:    Reason Eval/Treat Not Completed: Patient at procedure or test/unavailable; down for loop recorder implant.  Will follow up.   Marley Simmers 08/23/2023, 9:49 AM Abigail Hoff, PT Acute Rehabilitation Services Office:[phone] 08/23/2023

## 2023-08-23 NOTE — Plan of Care (Signed)
  Problem: Consults Goal: RH STROKE PATIENT EDUCATION Description: See Patient Education module for education specifics  Outcome: Progressing   Problem: RH BOWEL ELIMINATION Goal: RH STG MANAGE BOWEL WITH ASSISTANCE Description: STG Manage Bowel with toileting Assistance. Outcome: Progressing Goal: RH STG MANAGE BOWEL W/MEDICATION W/ASSISTANCE Description: STG Manage Bowel with Medication with mod I  Assistance. Outcome: Progressing   Problem: RH SAFETY Goal: RH STG ADHERE TO SAFETY PRECAUTIONS W/ASSISTANCE/DEVICE Description: STG Adhere to Safety Precautions With cues Assistance/Device. Outcome: Progressing   Problem: RH PAIN MANAGEMENT Goal: RH STG PAIN MANAGED AT OR BELOW PT'S PAIN GOAL Description: Pain  4 with prns Outcome: Progressing   Problem: RH KNOWLEDGE DEFICIT Goal: RH STG INCREASE KNOWLEDGE OF DIABETES Description: Patient and spouse will be able to manage DM using educational resources for medication and dietary modification independently Outcome: Progressing Goal: RH STG INCREASE KNOWLEDGE OF HYPERTENSION Description: Patient and spouse will be able to manage HTN using educational resources for medication and dietary modification independently Outcome: Progressing Goal: RH STG INCREASE KNOWLEGDE OF HYPERLIPIDEMIA Description: Patient and spouse will be able to manage HLD using educational resources for medication and dietary modification independently Outcome: Progressing Goal: RH STG INCREASE KNOWLEDGE OF STROKE PROPHYLAXIS Description: Patient and spouse will be able to manage secondary risks using educational resources for medication and dietary modification independently Outcome: Progressing

## 2023-08-23 NOTE — Discharge Summary (Signed)
 Physician Discharge Summary   [name] MRN: [id number] DOB: 16-Oct-1950  Admit date:     08/18/2023  Discharge date: 08/23/23  Discharge Physician: Feliciana Horn   PCP: Annella Kief, NP   Recommendations at discharge:  Please follow up with PCP in one week.  Please follow up with neurology as recommended.   Discharge Diagnoses: Principal Problem:   CVA (cerebrovascular accident) [facility]) Active Problems:   Premature atrial complexes   Essential hypertension   Hyperlipidemia   CKD (chronic kidney disease) stage 3, GFR 30-59 ml/min (HCC)   Fibromyalgia   Depression   Gout   Insomnia   Obesity (BMI 30-39.9)  Resolved Problems:   * No resolved hospital problems. *  Hospital Course: Amy Hooper is a 73 y.o. female with medical history significant of hypertension, hyperlipidemia, diabetes mellitus type 2, depression, CKD stage IIIb, and fibromyalgia reports worsening spasms of the right lower extremity , thought it was probably her sciatica pain.  Additionally she reports that while walking her RLE was dragging, for atleast a day prior to admission. Patient presented as a code stroke.  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  Neurology had been consulted.  Patient was not a thrombolytics candidate and also deemed not a candidate for endovascular intervention.  Patient had been started on dual antiplatelet therapy.  TRH consulted to admit for completion of stroke workup.     Assessment and Plan:   Acute CVA MRI brain shows Small region of acute ischemia within the posterior paramedian left frontal lobe.  Further stroke work up in progress.  Echocardiogram shows LVEF of 65 to 70%, no regional wall motion ab. Grade 1 diastolic dysfunction. Left atrial size is mod dilated.  Continue  with aspirin  and plavix  for 21 days followed by aspirin  alone and crestor .  LDL is 136 , A1c is 5 Lower extremity duplex is negative for DVT.   Neurology on board.  Therapy evals done.  CIR on board.  Plan for loop recorder prior to discharge.  EP team aware.      Type 2 DM A1c is 5 Diet controlled.     Hypertension Well controlled.  Decrease the dose of amlodipine  2.5 mg daily and losartan  25 mg daily.      Hyperlipidemia LDL is 136, continue with statin.      Stage 3 b CKD Creatinine at baseline.  No new labs.      Fibromyalgia Resume home meds.    Body mass index is 36.69 kg/m. Obesity.  Recommend outpatient follow up with PCP     Gout  Continue with allopurinol .      Headache Tylenol  and oxy prn.        Consultants: neurology.  Procedures performed: echocardiogram.   Disposition: Rehabilitation facility Diet recommendation:  Discharge Diet Orders (From admission, onward)     Start     Ordered   08/22/23 0000  Diet - low sodium heart healthy        08/22/23 0923           Carb modified diet DISCHARGE MEDICATION: Allergies as of 08/23/2023       Reactions   Cefuroxime Hives, Rash   Other reaction(s): Unknown Other Reaction(s): Unknown   Cefuroxime Axetil Rash, Hives   Clarithromycin Hives, Rash  Other reaction(s): Unknown Other Reaction(s): Unknown   Nsaids Nausea And Vomiting   Other reaction(s): Unknown Other Reaction(s): Unknown   Hydrocodone     Other reaction(s): Unknown Other Reaction(s): Unknown   Hydrocodone -acetaminophen  Other (See Comments)   Stomach upset Other Reaction(s): Other Stomach upset    Stomach upset, Stomach upset    Stomach upset,     Stomach upset  Stomach upset,   Other Other (See Comments), Nausea And Vomiting   Upset stomach Other Reaction(s): Other (See Comments), Unknown Upset stomach    Upset stomach Other reaction(s): Unknown   Oxycodone -acetaminophen  Other (See Comments)   Other  reaction(s): Unknown Other Reaction(s): Other, Unknown Hallucination   Robaxin  [methocarbamol ] Nausea And Vomiting   "It tears my stomach up."   Toradol  [ketorolac  Tromethamine ] Nausea And Vomiting   Tramadol  Nausea And Vomiting, Other (See Comments)   Upset stomach Other reaction(s): Unknown Other Reaction(s): Other, Unknown Upset stomach    Upset stomach Other reaction(s): Unknown        Medication List     TAKE these medications    allopurinol  100 MG tablet Commonly known as: ZYLOPRIM  Take 2 tablets (200 mg total) by mouth daily.   amLODipine  2.5 MG tablet Commonly known as: NORVASC  Take 1 tablet (2.5 mg total) by mouth daily. What changed:  medication strength how much to take   aspirin  EC 81 MG tablet Take 1 tablet (81 mg total) by mouth daily. Swallow whole.   clopidogrel  75 MG tablet Commonly known as: PLAVIX  Take 1 tablet (75 mg total) by mouth daily.   dapagliflozin  propanediol 5 MG Tabs tablet Commonly known as: FARXIGA  Take 1 tablet (5 mg total) by mouth daily.   DULoxetine  20 MG capsule Commonly known as: CYMBALTA  TAKE 1 CAPSULE BY MOUTH DAILY What changed: when to take this   eszopiclone  1 MG Tabs tablet Commonly known as: LUNESTA  Take 1 tablet (1 mg total) by mouth at bedtime as needed for sleep. Take immediately before bedtime What changed:  when to take this additional instructions   fluticasone  50 MCG/ACT nasal spray Commonly known as: FLONASE  Place 1 spray into both nostrils daily as needed for allergies.   HYDROcodone -acetaminophen  7.5-325 MG tablet Commonly known as: NORCO Take 1 tablet by mouth 3 (three) times daily as needed for moderate pain (pain score 4-6).   hydrocortisone  2.5 % rectal cream Commonly known as: ANUSOL -HC PLACE 1 APPLICATION RECTALLY 2 (TWO) TIMES DAILY.   ketoconazole  2 % cream Commonly known as: NIZORAL  Apply 1 Application topically daily. Apply to rash twice a day. If no improvement after 2 weeks, please  let provider know.   loratadine  10 MG tablet Commonly known as: CLARITIN  Take 10 mg by mouth daily as needed for allergies.   losartan  50 MG tablet Commonly known as: COZAAR  Take 1 tablet (50 mg total) by mouth daily.   mometasone  0.1 % cream Commonly known as: ELOCON  Apply very thin layer to rash at bedtime. May use one other time during the day if needed. Stop after 7 days, if still needed may repeat after 3 day break.   polyethylene glycol 17 g packet Commonly known as: MIRALAX  / GLYCOLAX  Take 17 g by mouth daily. Start taking on: August 24, 2023   pregabalin  75 MG capsule Commonly known as: Lyrica  Take 1 capsule (75 mg total) by mouth 2 (two) times daily.   rosuvastatin  20 MG tablet Commonly known as: CRESTOR  Take 1 tablet (20 mg total) by mouth daily.   senna-docusate 8.6-50 MG tablet  Commonly known as: Senokot-S Take 2 tablets by mouth at bedtime as needed for mild constipation.   Vitamin D  (Ergocalciferol ) 1.25 MG (50000 UNIT) Caps capsule Commonly known as: DRISDOL  Take 1 capsule (50,000 Units total) by mouth every 7 (seven) days.         Follow-up Information     [facility]. Schedule an appointment as soon as possible for a visit in 1 month(s).   Specialty: Neurology Why: stroke clinic Contact information: [address] [place]  [address] [phone]               Discharge Exam: Cleavon Curls Weights   08/19/23 0644 08/22/23 0510  Weight: 91 kg 92.2 kg   General exam: Appears calm and comfortable  Respiratory system: Clear to auscultation. Respiratory effort normal. Cardiovascular system: S1 & S2 heard, RRR. No JVD, Gastrointestinal system: Abdomen is nondistended, soft and nontender. Central nervous system: Alert and oriented.  Extremities: Symmetric 5 x 5 power. Skin: No rashes,  Psychiatry:  Mood & affect appropriate.    Condition at discharge: fair  The results of significant  diagnostics from this hospitalization (including imaging, microbiology, ancillary and laboratory) are listed below for reference.   Imaging Studies: ECHOCARDIOGRAM COMPLETE Result Date: 08/19/2023    ECHOCARDIOGRAM REPORT   Patient Name:   LYNSI DOONER Date of Exam: 08/19/2023 Medical Rec #:  [id number]             Height:       62.0 in Accession #:    [id number]            Weight:       200.6 lb Date of Birth:  03-Oct-1950             BSA:          1.914 m Patient Age:    72 years              BP:           128/70 mmHg Patient Gender: F                     HR:           68 bpm. Exam Location:  Inpatient Procedure: 2D Echo, Color Doppler and Cardiac Doppler (Both Spectral and Color            Flow Doppler were utilized during procedure). Indications:    Stroke I63.9  History:        Patient has prior history of Echocardiogram examinations, most                 recent 12/27/2022. Risk Factors:Hypertension.  Sonographer:    Jeralene Mom Referring Phys: [id number] RONDELL A SMITH IMPRESSIONS  1. Left ventricular ejection fraction, by estimation, is 65 to 70%. The left ventricle has normal function. The left ventricle has no regional wall motion abnormalities. There is mild left ventricular hypertrophy. Left ventricular diastolic parameters are consistent with Grade I diastolic dysfunction (impaired relaxation).  2. Right ventricular systolic function is normal. The right ventricular size is normal. There is normal pulmonary artery systolic pressure.  3. Left atrial size was moderately dilated.  4. The mitral valve is normal in structure. Trivial mitral valve regurgitation. No evidence of mitral stenosis.  5. The aortic valve is tricuspid. Aortic valve regurgitation is not visualized. No aortic stenosis is present.  6. The inferior vena cava is normal in size with greater than  50% respiratory variability, suggesting right atrial pressure of 3 mmHg. FINDINGS  Left Ventricle: Left ventricular ejection fraction,  by estimation, is 65 to 70%. The left ventricle has normal function. The left ventricle has no regional wall motion abnormalities. The left ventricular internal cavity size was normal in size. There is  mild left ventricular hypertrophy. Left ventricular diastolic parameters are consistent with Grade I diastolic dysfunction (impaired relaxation). Right Ventricle: The right ventricular size is normal. No increase in right ventricular wall thickness. Right ventricular systolic function is normal. There is normal pulmonary artery systolic pressure. The tricuspid regurgitant velocity is 2.01 m/s, and  with an assumed right atrial pressure of 3 mmHg, the estimated right ventricular systolic pressure is 19.2 mmHg. Left Atrium: Left atrial size was moderately dilated. Right Atrium: Right atrial size was normal in size. Pericardium: There is no evidence of pericardial effusion. Mitral Valve: The mitral valve is normal in structure. Trivial mitral valve regurgitation. No evidence of mitral valve stenosis. Tricuspid Valve: The tricuspid valve is normal in structure. Tricuspid valve regurgitation is not demonstrated. No evidence of tricuspid stenosis. Aortic Valve: The aortic valve is tricuspid. Aortic valve regurgitation is not visualized. No aortic stenosis is present. Aortic valve mean gradient measures 5.0 mmHg. Aortic valve peak gradient measures 9.5 mmHg. Aortic valve area, by VTI measures 3.10 cm. Pulmonic Valve: The pulmonic valve was normal in structure. Pulmonic valve regurgitation is not visualized. No evidence of pulmonic stenosis. Aorta: The aortic root is normal in size and structure. Venous: The inferior vena cava is normal in size with greater than 50% respiratory variability, suggesting right atrial pressure of 3 mmHg. IAS/Shunts: No atrial level shunt detected by color flow Doppler.  LEFT VENTRICLE PLAX 2D LVIDd:         4.00 cm   Diastology LVIDs:         2.30 cm   LV e' medial:    6.96 cm/s LV PW:          0.90 cm   LV E/e' medial:  10.8 LV IVS:        0.90 cm   LV e' lateral:   7.51 cm/s LVOT diam:     2.00 cm   LV E/e' lateral: 10.0 LV SV:         103 LV SV Index:   54 LVOT Area:     3.14 cm  RIGHT VENTRICLE RV Basal diam:  3.60 cm RV Mid diam:    3.00 cm RV S prime:     18.20 cm/s TAPSE (M-mode): 2.6 cm LEFT ATRIUM             Index        RIGHT ATRIUM           Index LA diam:        3.80 cm 1.99 cm/m   RA Area:     15.30 cm LA Vol (A2C):   84.4 ml 44.09 ml/m  RA Volume:   36.70 ml  19.17 ml/m LA Vol (A4C):   74.6 ml 38.97 ml/m LA Biplane Vol: 84.3 ml 44.04 ml/m  AORTIC VALVE AV Area (Vmax):    3.04 cm AV Area (Vmean):   2.97 cm AV Area (VTI):     3.10 cm AV Vmax:           154.00 cm/s AV Vmean:          107.000 cm/s AV VTI:  0.332 m AV Peak Grad:      9.5 mmHg AV Mean Grad:      5.0 mmHg LVOT Vmax:         149.00 cm/s LVOT Vmean:        101.000 cm/s LVOT VTI:          0.328 m LVOT/AV VTI ratio: 0.99  AORTA Ao Root diam: 2.60 cm Ao Asc diam:  2.70 cm MITRAL VALVE                TRICUSPID VALVE MV Area (PHT): 3.34 cm     TR Peak grad:   16.2 mmHg MV Decel Time: 227 msec     TR Vmax:        201.00 cm/s MR Peak grad: 70.9 mmHg MR Vmax:      421.00 cm/s   SHUNTS MV E velocity: 75.20 cm/s   Systemic VTI:  0.33 m MV A velocity: 107.00 cm/s  Systemic Diam: 2.00 cm MV E/A ratio:  0.70 Arta Lark Electronically signed by Arta Lark Signature Date/Time: 08/19/2023/5:31:34 PM    Final    VAS US  LOWER EXTREMITY VENOUS (DVT) Result Date: 08/19/2023  Lower Venous DVT Study Patient Name:  LILLYN WIECZOREK  Date of Exam:   08/19/2023 Medical Rec #: [id number]              Accession #:    [id number] Date of Birth: 1950-11-30              Patient Gender: F Patient Age:   89 years Exam Location:  [facility] Procedure:      VAS US  LOWER EXTREMITY VENOUS (DVT) Referring Phys: Fraser Jackson XU --------------------------------------------------------------------------------  Indications: Embolic  stroke.  Performing Technologist: Franky Ivanoff Sturdivant-Jones RDMS, RVT  Examination Guidelines: A complete evaluation includes B-mode imaging, spectral Doppler, color Doppler, and power Doppler as needed of all accessible portions of each vessel. Bilateral testing is considered an integral part of a complete examination. Limited examinations for reoccurring indications may be performed as noted. The reflux portion of the exam is performed with the patient in reverse Trendelenburg.  +---------+---------------+---------+-----------+----------+--------------+ RIGHT    CompressibilityPhasicitySpontaneityPropertiesThrombus Aging +---------+---------------+---------+-----------+----------+--------------+ CFV      Full           Yes      Yes                                 +---------+---------------+---------+-----------+----------+--------------+ SFJ      Full                                                        +---------+---------------+---------+-----------+----------+--------------+ FV Prox  Full                                                        +---------+---------------+---------+-----------+----------+--------------+ FV Mid   Full                                                        +---------+---------------+---------+-----------+----------+--------------+  FV DistalFull                                                        +---------+---------------+---------+-----------+----------+--------------+ PFV      Full                                                        +---------+---------------+---------+-----------+----------+--------------+ POP      Full           Yes      Yes                                 +---------+---------------+---------+-----------+----------+--------------+ PTV      Full                                                        +---------+---------------+---------+-----------+----------+--------------+ PERO     Full                                                         +---------+---------------+---------+-----------+----------+--------------+   +---------+---------------+---------+-----------+----------+--------------+ LEFT     CompressibilityPhasicitySpontaneityPropertiesThrombus Aging +---------+---------------+---------+-----------+----------+--------------+ CFV      Full           Yes      Yes                                 +---------+---------------+---------+-----------+----------+--------------+ SFJ      Full                                                        +---------+---------------+---------+-----------+----------+--------------+ FV Prox  Full                                                        +---------+---------------+---------+-----------+----------+--------------+ FV Mid   Full                                                        +---------+---------------+---------+-----------+----------+--------------+ FV DistalFull                                                        +---------+---------------+---------+-----------+----------+--------------+  PFV      Full                                                        +---------+---------------+---------+-----------+----------+--------------+ POP      Full           Yes      Yes                                 +---------+---------------+---------+-----------+----------+--------------+ PTV      Full                                                        +---------+---------------+---------+-----------+----------+--------------+ PERO     Full                                                        +---------+---------------+---------+-----------+----------+--------------+     Summary: BILATERAL: - No evidence of deep vein thrombosis seen in the lower extremities, bilaterally. -No evidence of popliteal cyst, bilaterally.   *See table(s) above for measurements and observations. Electronically  signed by Jimmye Moulds MD on 08/19/2023 at 5:09:13 PM.    Final    MR BRAIN WO CONTRAST Result Date: 08/18/2023 CLINICAL DATA:  Stroke follow-up EXAM: MRI HEAD WITHOUT CONTRAST TECHNIQUE: Multiplanar, multiecho pulse sequences of the brain and surrounding structures were obtained without intravenous contrast. COMPARISON:  None Available. FINDINGS: Brain: Small region of acute ischemia within the posterior paramedian left frontal lobe. No acute or chronic hemorrhage. There is multifocal hyperintense T2-weighted signal within the white matter. Parenchymal volume and CSF spaces are normal. The midline structures are normal. Vascular: Normal flow voids. Skull and upper cervical spine: Normal calvarium and skull base. Visualized upper cervical spine and soft tissues are normal. Sinuses/Orbits:No paranasal sinus fluid levels or advanced mucosal thickening. No mastoid or middle ear effusion. Normal orbits. IMPRESSION: Small region of acute ischemia within the posterior paramedian left frontal lobe. No hemorrhage or mass effect. Electronically Signed   By: Juanetta Nordmann M.D.   On: 08/18/2023 23:05   CT ANGIO HEAD NECK W WO CM Addendum Date: 08/18/2023 ADDENDUM REPORT: 08/18/2023 13:52 ADDENDUM: These results were called by telephone at the time of interpretation on 08/18/2023 at 1:52 pm to provider Hiawatha Lout , who verbally acknowledged these results. Electronically Signed   By: Denny Flack M.D.   On: 08/18/2023 13:52   Result Date: 08/18/2023 CLINICAL DATA:  Neuro deficit, concern for stroke. Weakness and right leg and arm with tingling in right leg. Gait abnormality due to weakness. EXAM: CT ANGIOGRAPHY HEAD AND NECK WITH AND WITHOUT CONTRAST TECHNIQUE: Multidetector CT imaging of the head and neck was performed using the standard protocol during bolus administration of intravenous contrast. Multiplanar CT image reconstructions and MIPs were obtained to evaluate the vascular anatomy. Carotid stenosis  measurements (when applicable) are obtained utilizing NASCET criteria, using the distal internal carotid diameter as the denominator. RADIATION DOSE  REDUCTION: This exam was performed according to the departmental dose-optimization program which includes automated exposure control, adjustment of the mA and/or kV according to patient size and/or use of iterative reconstruction technique. CONTRAST:  75mL OMNIPAQUE  IOHEXOL  350 MG/ML SOLN COMPARISON:  None Available. FINDINGS: CT HEAD FINDINGS Brain: No acute intracranial hemorrhage. No CT evidence of acute infarct. No edema, mass effect, or midline shift. The basilar cisterns are patent. Ventricles: Ventricles are normal in size and configuration. Vascular: No hyperdense vessel. Intracranial atherosclerotic calcifications noted. Skull: No acute or aggressive finding. Sinuses/orbits: Mild mucosal thickening in the ethmoid sinuses. Other: Mastoid air cells are clear. CTA NECK FINDINGS Aortic arch: Common origin of the brachiocephalic and left common carotid arteries. Imaged portion shows no evidence of aneurysm or dissection. Mild atherosclerosis. No significant stenosis of the major arch vessel origins. Pulmonary arteries: As permitted by contrast timing, there are no filling defects in the visualized pulmonary arteries. Subclavian arteries: The subclavian arteries are patent bilaterally. Right carotid system: Patent. Mild atherosclerosis at the carotid bifurcation without hemodynamically significant stenosis. No evidence of dissection. Left carotid system: Patent. Mild atherosclerosis at the carotid bifurcation without hemodynamically significant stenosis. No evidence of dissection. Vertebral arteries: Codominant. No evidence of dissection, stenosis (50% or greater), or occlusion. Mild tortuosity of the V1 and V2 segments. Skeleton: No acute findings. Degenerative changes in the cervical spine. Other neck: The visualized airway is patent. No cervical lymphadenopathy.  Upper chest: Visualized lung apices are clear. Review of the MIP images confirms the above findings CTA HEAD FINDINGS ANTERIOR CIRCULATION: The intracranial ICAs are patent bilaterally. Mild atherosclerosis of the carotid siphons. No significant stenosis, proximal occlusion, aneurysm, or vascular malformation. MCAs: The middle cerebral arteries are patent bilaterally. ACAs: The right ACA is patent. The A1 and A2 segments are patent on the left. There is occlusion of the A3 and proximal A4 segments of the left ACA with reconstitution of the left A4 segment over the anterior body of the corpus callosum. POSTERIOR CIRCULATION: No significant stenosis, proximal occlusion, aneurysm, or vascular malformation. PCAs: The posterior cerebral arteries are patent bilaterally. Pcomm: The posterior communicating arteries are visualized bilaterally. SCAs: The superior cerebellar arteries are patent bilaterally. Basilar artery: Patent AICAs: Patent PICAs: Patent Vertebral arteries: The intracranial vertebral arteries are patent. Venous sinuses: As permitted by contrast timing, patent. 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 There is reconstitution of the left A4 segment over the anterior body of the corpus callosum with diminutive caliber. Intracranial arterial vasculature is otherwise patent. Mild atherosclerosis as above.  No high-grade stenosis. No acute intracranial hemorrhage. Aortic Atherosclerosis (ICD10-I70.0). Electronically Signed: By: Denny Flack M.D. On: 08/18/2023 13:46    Microbiology: Results for orders placed or performed during the hospital encounter of 03/25/21  Surgical pcr screen     Status: None   Collection Time: 03/25/21  1:19 PM   Specimen: Nasal Mucosa; Nasal Swab  Result Value Ref Range Status   MRSA, PCR NEGATIVE NEGATIVE Final   Staphylococcus aureus NEGATIVE NEGATIVE  Final    Comment: (NOTE) The Xpert SA Assay (FDA approved for NASAL specimens in patients 58 years of age and older), is one component of a comprehensive surveillance program. It is not intended to diagnose infection nor to guide or monitor treatment. Performed at [facility], [address], [place], [place] [address]   SARS Coronavirus 2 by RT  PCR (hospital order, performed in [facility] hospital lab) Nasopharyngeal Nasopharyngeal Swab     Status: None   Collection Time: 03/25/21  1:19 PM   Specimen: Nasopharyngeal Swab  Result Value Ref Range Status   SARS Coronavirus 2 NEGATIVE NEGATIVE Final    Comment: (NOTE) SARS-CoV-2 target nucleic acids are NOT DETECTED.  The SARS-CoV-2 RNA is generally detectable in upper and lower respiratory specimens during the acute phase of infection. The lowest concentration of SARS-CoV-2 viral copies this assay can detect is 250 copies / mL. A negative result does not preclude SARS-CoV-2 infection and should not be used as the sole basis for treatment or other patient management decisions.  A negative result may occur with improper specimen collection / handling, submission of specimen other than nasopharyngeal swab, presence of viral mutation(s) within the areas targeted by this assay, and inadequate number of viral copies (<250 copies / mL). A negative result must be combined with clinical observations, patient history, and epidemiological information.  Fact Sheet for Patients:   BoilerBrush.com.cy  Fact Sheet for Healthcare Providers: https://pope.com/  This test is not yet approved or  cleared by the [place]  FDA and has been authorized for detection and/or diagnosis of SARS-CoV-2 by FDA under an Emergency Use Authorization (EUA).  This EUA will remain in effect (meaning this test can be used) for the duration of the COVID-19 declaration under Section 564(b)(1) of the Act, 21  U.S.C. section 360bbb-3(b)(1), unless the authorization is terminated or revoked sooner.  Performed at [facility], [address] [place], [place] [address]   Aerobic/Anaerobic Culture w Gram Stain (surgical/deep wound)     Status: None   Collection Time: 03/25/21  3:58 PM   Specimen: PATH Other; Tissue  Result Value Ref Range Status   Specimen Description WOUND  Final   Special Requests LUMBAR INCINSION  Final   Gram Stain NO WBC SEEN NO ORGANISMS SEEN   Final   Culture   Final    FEW ENTEROBACTER AEROGENES FEW MORGANELLA MORGANII RARE BACTEROIDES FRAGILIS BETA LACTAMASE POSITIVE Performed at [facility], [address] [place], [place] [address]    Report Status 03/29/2021 FINAL  Final   Organism ID, Bacteria ENTEROBACTER AEROGENES  Final   Organism ID, Bacteria MORGANELLA MORGANII  Final      Susceptibility   Klebsiella aerogenes - MIC*    CEFAZOLIN  >=64 RESISTANT Resistant     CEFEPIME  <=0.12 SENSITIVE Sensitive     CEFTAZIDIME <=1 SENSITIVE Sensitive     CEFTRIAXONE <=0.25 SENSITIVE Sensitive     CIPROFLOXACIN  <=0.25 SENSITIVE Sensitive     GENTAMICIN <=1 SENSITIVE Sensitive     IMIPENEM 1 SENSITIVE Sensitive     TRIMETH/SULFA <=20 SENSITIVE Sensitive     PIP/TAZO <=4 SENSITIVE Sensitive     * FEW ENTEROBACTER AEROGENES   Morganella morganii - MIC*    AMPICILLIN >=32 RESISTANT Resistant     CEFAZOLIN  >=64 RESISTANT Resistant     CEFTAZIDIME <=1 SENSITIVE Sensitive     CIPROFLOXACIN  <=0.25 SENSITIVE Sensitive     GENTAMICIN <=1 SENSITIVE Sensitive     IMIPENEM 2 SENSITIVE Sensitive     TRIMETH/SULFA <=20 SENSITIVE Sensitive     AMPICILLIN/SULBACTAM 8 SENSITIVE Sensitive     PIP/TAZO <=4 SENSITIVE Sensitive     * FEW MORGANELLA MORGANII    Labs: CBC: Recent Labs  Lab 08/18/23 1204 08/18/23 1213  WBC  --  4.9  NEUTROABS  --  2.5  HGB 13.6 13.5  HCT 40.0 40.4  MCV  --  88.8  PLT  --  322   Basic Metabolic Panel: Recent Labs  Lab  08/18/23 1204 08/18/23 1217 08/18/23 2251 08/21/23 0429  NA 142 138  --  139  K 4.3 4.3  --  4.2  CL 107 104  --  107  CO2  --  25  --  24  GLUCOSE 85 88  --  130*  BUN 13 12  --  22  CREATININE 1.40* 1.25*  --  1.36*  CALCIUM   --  9.7  --  8.6*  MG  --   --  2.2  --    Liver Function Tests: Recent Labs  Lab 08/18/23 1217  AST 20  ALT 15  ALKPHOS 105  BILITOT 0.6  PROT 7.6  ALBUMIN  3.7   CBG: Recent Labs  Lab 08/18/23 1151  GLUCAP 73    Discharge time spent: 37 minutes.   Signed: Shikara Mcauliffe, MD [facility] 08/23/2023

## 2023-08-23 NOTE — Discharge Instructions (Signed)
 Care After Your Loop Recorder   Monitor your cardiac device site for redness, swelling, and drainage. Call the device clinic at [phone] if you experience these symptoms or fever/chills.  If you notice bleeding from your site, hold firm, but gently pressure with two fingers for 5 minutes. Dried blood on the steri-strips when removing the outer bandage is normal.   Keep the large square bandage on your site for 24 hours and then you may remove it yourself. Keep the steri-strips underneath in place.   You may shower after 72 hours / 3 days from your procedure with the steri-strips in place. They will usually fall off on their own, or may be removed after 10 days. Pat dry.   Avoid lotions, ointments, or perfumes over your incision until it is well-healed.  Please do not submerge in water until your site is completely healed.   Your device is MRI compatible.   Remote monitoring is used to monitor your cardiac device from home. This monitoring is scheduled every month by our office. It allows [place] to keep an eye on the function of your device to ensure it is working properly.

## 2023-08-23 NOTE — Progress Notes (Signed)
 Rawland Caddy, MD  Physician Physical Medicine and Rehabilitation   PMR Pre-admission    Signed   Date of Service: 08/21/2023  5:00 PM  Related encounter: ED to Hosp-Admission (Discharged) from 08/18/2023 in [facility]   Signed     Expand All Collapse All  Show:Clear all [x] Written[x] Templated[x] Copied  Added by: [x] Adiana Smelcer, Kristan Petit, RN[x] Rawland Caddy, MD  [] Hover for details PMR Admission Coordinator Pre-Admission Assessment   [name] is an 73 y.o., female MRN: [id number] DOB: 12-29-1950 Height: 5\' 2"  (157.5 cm) Weight: 92.2 kg   Insurance Information HMO:     PPO:      PCP:      IPA:      80/20:      OTHER:  PRIMARY: Medicare a and b      Policy#: [id number]      Subscriber: pt Benefits:  Phone #: passport one source online     Name: 5/30 Eff. Date: a 02/19/2018 and b 04/23/2023     Deduct: $1676      Out of Pocket Max: none      Life Max: none CIR: 100%      SNF: 20 full days Outpatient: 80%     Co-Pay: 20% Home Health: 100%      Co-Pay: none DME: 80%     Co-Pay: 20% Providers: pt choice  SECONDARY: [employer] Medicare supplement      Policy#: [id number]   Financial Counselor:       Phone#:    The "Data Collection Information Summary" for patients in Inpatient Rehabilitation Facilities with attached "Privacy Act Statement-Health Care Records" was provided and verbally reviewed with: Patient   Emergency Contact Information Contact Information       Name Relation Home Work Mobile    Reifschneider,Glenroy Spouse     [phone]    Brunetta Capes     [phone]         Other Contacts   None on File      Current Medical History  Patient Admitting Diagnosis: CVA   History of Present Illness: 73 yo female with history of HTN, HLD, type 2 DM, depression, sleep apnea on CPAP, CKD stage IIIb and fibromyalgia. Has had significant spasms to her right leg, developed difficulty walking and weakness on her right side.  Presented on 08/18/2023.   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 CTA head & neck 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 There is reconstitution of the left A4 segment over the anterior body of the corpus callosum with diminutive caliber. Intracranial arterial vasculature is otherwise patent. Mild atherosclerosis as above.  No high-grade stenosis.MRI  Small region of acute ischemia within the posterior paramedian left frontal lobe. No hemorrhage or mass effect.   Placed on ASA and clopidogrel  for 3 weeks then ASA alone.LOOP to be placed at discharge. LDL 136. Had not tolerated atorvastatin  in the past due to leg pain. Add rosuvastatin . Willing to try Crestor  and then consider Repatha if does not tolerate. Home meds of Norvasc  and losartan  to be resumed.   Complete NIHSS TOTAL: 2   Patient's medical [facility] from [facility] has been reviewed by the rehabilitation admission coordinator and physician.   Past Medical History      Past Medical History:  Diagnosis Date   Allergy 1976    Tramadol  NSAIDS   Chronic kidney disease  Depression     Fibromyalgia     Gout     HTN (hypertension)     Hyperlipidemia 09/01/22   Insomnia     Morbid obesity (HCC)     Non-recurrent acute suppurative otitis media of left ear without spontaneous rupture of tympanic membrane 04/30/2022   Osteoarthritis     Palpitation     Sleep apnea          Has the patient had major surgery during 100 days prior to admission? No   Family History   family history includes Arthritis in her father; Dementia in her father; Gout in her father; Heart disease in her father; Hyperlipidemia in her mother and another family member; Hypertension in her father; Hypothyroidism in her mother; Kidney disease in her father; Kidney failure in her father; Miscarriages / Stillbirths in her mother; Prostate cancer in her father; Stroke in her father.    Current Medications  Current Medications    Current Facility-Administered Medications:    allopurinol  (ZYLOPRIM ) tablet 200 mg, 200 mg, Oral, Daily, Smith, Rondell A, MD, 200 mg at 08/23/23 1610   amLODipine  (NORVASC ) tablet 2.5 mg, 2.5 mg, Oral, Daily, Akula, Vijaya, MD, 2.5 mg at 08/23/23 0834   aspirin  EC tablet 81 mg, 81 mg, Oral, Daily, Consuelo Denmark, MD, 81 mg at 08/23/23 0835   butalbital-acetaminophen -caffeine (FIORICET) 50-325-40 MG per tablet 1 tablet, 1 tablet, Oral, Q6H PRN, Akula, Vijaya, MD, 1 tablet at 08/22/23 1302   capsaicin  (ZOSTRIX) 0.025 % cream, , Topical, BID PRN, Akula, Vijaya, MD   clopidogrel  (PLAVIX ) tablet 75 mg, 75 mg, Oral, Daily, Consuelo Denmark, MD, 75 mg at 08/23/23 9604   dapagliflozin  propanediol (FARXIGA ) tablet 5 mg, 5 mg, Oral, Daily, Smith, Rondell A, MD, 5 mg at 08/23/23 5409   DULoxetine  (CYMBALTA ) DR capsule 20 mg, 20 mg, Oral, QPM, Smith, Rondell A, MD, 20 mg at 08/22/23 1725   enoxaparin  (LOVENOX ) injection 40 mg, 40 mg, Subcutaneous, Q24H, Smith, Rondell A, MD, 40 mg at 08/22/23 1725   fluticasone  (FLONASE ) 50 MCG/ACT nasal spray 1 spray, 1 spray, Each Nare, Daily PRN, Manny Sees A, MD, 1 spray at 08/22/23 0815   HYDROcodone -acetaminophen  (NORCO) 7.5-325 MG per tablet 1 tablet, 1 tablet, Oral, TID PRN, Manny Sees A, MD, 1 tablet at 08/23/23 8119   lidocaine -EPINEPHrine (XYLOCAINE  W/EPI) 1 %-1:100000 (with pres) injection, , , ,    loratadine  (CLARITIN ) tablet 10 mg, 10 mg, Oral, Daily PRN, Felipe Horton, Rondell A, MD, 10 mg at 08/23/23 1478   losartan  (COZAAR ) tablet 50 mg, 50 mg, Oral, Daily, Akula, Vijaya, MD, 50 mg at 08/23/23 2956   Oral care mouth rinse, 15 mL, Mouth Rinse, PRN, Akula, Vijaya, MD   polyethylene glycol (MIRALAX  / GLYCOLAX ) packet 17 g, 17 g, Oral, Daily, Akula, Vijaya, MD, 17 g at 08/22/23 1302   pregabalin  (LYRICA ) capsule 75 mg, 75 mg, Oral, BID, Smith, Rondell A, MD, 75 mg at 08/23/23 2130   rosuvastatin  (CRESTOR ) tablet 20 mg,  20 mg, Oral, Daily, Shafer, Devon, NP, 20 mg at 08/23/23 8657   senna-docusate (Senokot-S) tablet 2 tablet, 2 tablet, Oral, BID, Akula, Vijaya, MD, 2 tablet at 08/22/23 2023   zolpidem  (AMBIEN ) tablet 5 mg, 5 mg, Oral, QHS, Smith, Rondell A, MD, 5 mg at 08/22/23 2024     Patients Current Diet:  Diet Order                  Diet - low sodium heart healthy  Diet - low sodium heart healthy             Diet Heart Fluid consistency: Thin  Diet effective now                       Precautions / Restrictions Precautions Precautions: Fall Restrictions Weight Bearing Restrictions Per Provider Order: No    Has the patient had 2 or more falls or a fall with injury in the past year? 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 Independent   Indoor Mobility: Did the patient need assistance with walking from room to room (with or without device)? Independent   Stairs: Did the patient need assistance with internal or external stairs (with or without device)? Independent   Functional Cognition: Did the patient need help planning regular tasks such as shopping or remembering to take medications? Independent   Patient Information Are you of Hispanic, Latino/a,or Spanish origin?: A. No, not of Hispanic, Latino/a, or Spanish origin What is your race?: B. Black or African American Do you need or want an interpreter to communicate with a doctor or health care staff?: 0. No   Patient's Response To:  Health Literacy and Transportation Is the patient able to respond to health literacy and transportation needs?: Yes Health Literacy - How often do you need to have someone help you when you read instructions, pamphlets, or other written material from your doctor or pharmacy?: Never In the past 12 months,  has lack of transportation kept you from medical appointments or from getting medications?: No In the past 12 months, has lack of transportation kept you from meetings, work, or from getting things needed for daily living?: No   Home Assistive Devices / Equipment Home Equipment: Rollator (4 wheels), Cane - single point   Prior Device Use: Indicate devices/aids used by the patient prior to current illness, exacerbation or injury? None of the above   Current Functional Level Cognition   Overall Cognitive Status: Within Functional Limits for tasks assessed Orientation Level: Oriented X4    Extremity Assessment (includes Sensation/Coordination)   Upper Extremity Assessment: RUE deficits/detail RUE Deficits / Details: grasp is improving, provided a blue foam block to work with to increase strength RUE Sensation: WNL RUE Coordination: decreased fine motor  Lower Extremity Assessment: Defer to PT evaluation RLE Deficits / Details: MMT inconsistent with functional strength observed in weight bearing: MMT - Hip flexion 1/5, knee extension 2/5 but held resistance of 4/5 in limited range, knee flexion  1/5, ankle dorsiflexion 1/5, hallux extension 0/5, ankle PF, 1/5, hip abduction 2/5. + Hoover sign. Able to raise bil heels in standing with UE support. Able to perform SLS on Rt with RW for support. RLE Sensation:  (SILT; but reports numbness throughout RLE.)     ADLs   Overall ADL's : Needs assistance/impaired Eating/Feeding: Independent, Sitting Grooming: Standing, Supervision/safety Upper Body Bathing: Sitting, Set up Lower Body Bathing: Sit to/from stand, Contact guard assist Lower Body Bathing Details (indicate cue type and reason): working on peri-care in session Upper Body Dressing : Sitting, Set up Lower Body Dressing: Sit to/from stand, Contact guard assist Toilet Transfer: Contact guard assist, Rolling walker (2 wheels), Ambulation Toileting- Clothing Manipulation and Hygiene: Contact  guard assist, Sit to/from stand Functional mobility during ADLs: Contact guard assist,  Rolling walker (2 wheels) General ADL Comments: Patient continues to progress towards goals. Session focus on assessment of strength in R hand and patient asking to work on peri-care in session. Patient able to complete ADLs at [facility], with compensatory strategies provided to assist with peri-care thoroughness. Patient with improved grasp in session, with [facility] activities provided, as well as blue foam block to work on overall strength. Recommendation remains approprate; OT will continue to follow.     Mobility   Overal bed mobility: Needs Assistance Bed Mobility: Supine to Sit Supine to sit: Supervision, HOB elevated Sit to supine: Contact guard assist General bed mobility comments: increased time, HOB up and A for safety     Transfers   Overall transfer level: Needs assistance Equipment used: Rolling walker (2 wheels) Transfers: Sit to/from Stand Sit to Stand: Contact guard assist General transfer comment: CGA with RW     Ambulation / Gait / Stairs / Wheelchair Mobility   Ambulation/Gait Ambulation/Gait assistance: Contact guard assist Gait Distance (Feet): 225 Feet Assistive device: Rolling walker (2 wheels) Gait Pattern/deviations: Step-to pattern, Decreased stride length, Decreased step length - right, Decreased dorsiflexion - right, Ataxic, Step-through pattern, Scissoring General Gait Details: R foot consistently moving medially with ambulation, increased time for turning and decreased foot clearance on R with fall risk Gait velocity: decreased Gait velocity interpretation: <1.8 ft/sec, indicate of risk for recurrent falls     Posture / Balance Balance Overall balance assessment: Needs assistance Sitting-balance support: No upper extremity supported, Feet supported Sitting balance-Leahy Scale: Good Standing balance support: Bilateral upper extremity supported Standing balance-Leahy Scale:  Poor Standing balance comment: RW for support High Level Balance Comments: side stepping at rail in hallway and then crossing over in front, pt relates R knee buckling and noted difficulty with clearing R foot when L stepping over; forward tandem gait with rail and hand hold A then forward on tip toes with rail and hand hold A. Standardized Balance Assessment Standardized Balance Assessment : Dynamic Gait Index Dynamic Gait Index Level Surface: Moderate Impairment Change in Gait Speed: Moderate Impairment Gait with Horizontal Head Turns: Moderate Impairment Gait with Vertical Head Turns: Moderate Impairment Gait and Pivot Turn: Moderate Impairment Step Over Obstacle: Moderate Impairment Step Around Obstacles: Moderate Impairment Steps: Severe Impairment Total Score: 7     Special needs/care consideration Hgb A1c 5.0 LOOP placed 6/3    Previous Home Environment  Living Arrangements: Spouse/significant other  Lives With: Spouse Available Help at Discharge: Available PRN/intermittently (spouse is in [place] for the next 2 weeks settling his Mom in from SNF) Type of Home: House Home Layout: One level Home Access: Stairs to enter Entrance Stairs-Rails: Right, Left Entrance Stairs-Number of Steps: 7 Bathroom Shower/Tub: Tub/shower unit, Health visitor: Standard Bathroom Accessibility: Yes How Accessible: Accessible via walker Home Care Services: No Additional Comments: spouse in [place] settling his Mom in at home from  SNF. [facility] for 2 weeks   Discharge Living Setting Plans for Discharge Living Setting: Patient's home, Lives with (comment) (spouse) Type of Home at Discharge: House Discharge Home Layout: One level Discharge Home Access: Stairs to enter Entrance Stairs-Rails: Right, Left Entrance Stairs-Number of Steps: 7 Discharge Bathroom Shower/Tub: Tub/shower unit, Walk-in shower Discharge Bathroom Toilet: Standard Discharge Bathroom Accessibility: Yes How Accessible:  Accessible via walker Does the patient have any problems obtaining your medications?: No   Social/Family/Support Systems Patient Roles: Spouse (RN) Contact Information: spouse Anticipated Caregiver: sister local until spouse returns Anticipated Caregiver's Contact Information: see contacts  Ability/Limitations of Caregiver: spouse in [place] for 2 weeks, other family only prn Caregiver Availability: Intermittent Discharge Plan Discussed with Primary Caregiver: Yes Is Caregiver In Agreement with Plan?: Yes Does Caregiver/Family have Issues with Lodging/Transportation while Pt is in Rehab?: No   Goals Patient/Family Goal for Rehab: Mod I with PT and OT Expected length of stay: ELOS 5- 7 days Pt/Family Agrees to Admission and willing to participate: Yes Program Orientation Provided & Reviewed with Pt/Caregiver Including Roles  & Responsibilities: Yes   Decrease burden of Care through IP rehab admission: n/a   Possible need for SNF placement upon discharge: not anticipated   Patient Condition: I have reviewed medical records from [facility], spoken with CM, and patient. I met with patient at the bedside for inpatient rehabilitation assessment.  Patient will benefit from ongoing PT and OT, can actively participate in 3 hours of therapy a day 5 days of the week, and can make measurable gains during the admission.  Patient will also benefit from the coordinated team approach during an Inpatient Acute Rehabilitation admission.  The patient will receive intensive therapy as well as Rehabilitation physician, nursing, social worker, and care management interventions.  Due to bladder management, bowel management, safety, skin/wound care, disease management, medication administration, pain management, and patient education the patient requires 24 hour a day rehabilitation nursing.  The patient is currently CGA assist overall with mobility and basic ADLs.  Discharge setting and therapy post discharge at  home with home health is anticipated.  Patient has agreed to participate in the Acute Inpatient Rehabilitation Program and will admit today.   Preadmission Screen Completed By:  Tanya Fantasia, RN MSN 08/23/2023 10:20 AM ______________________________________________________________________   Discussed status with Dr. Rachel Budds on 08/23/23 at 1020 and received approval for admission today.   Admission Coordinator:  Tanya Fantasia, RN MSN time 1020 Date 08/23/23    Assessment/Plan: Diagnosis: CVA Does the need for close, 24 hr/day Medical supervision in concert with the patient's rehab needs make it unreasonable for this patient to be served in a less intensive setting? Yes Co-Morbidities requiring supervision/potential complications: HTN, DM, OSA, CKD, fibromyalgia Due to bladder management, bowel management, safety, skin/wound care, disease management, medication administration, pain management, and patient education, does the patient require 24 hr/day rehab nursing? Yes Does the patient require coordinated care of a physician, rehab nurse, PT, OT, and SLP to address physical and functional deficits in the context of the above medical diagnosis(es)? Yes Addressing deficits in the following areas: balance, endurance, locomotion, strength, transferring, bowel/bladder control, bathing, dressing, feeding, grooming, toileting, and psychosocial support Can the patient actively participate in an intensive therapy program of at least 3 hrs of therapy 5 days a week? Yes The potential for patient to make measurable gains while on inpatient rehab is excellent Anticipated functional outcomes upon discharge from inpatient rehab: modified independent PT, modified independent OT, n/a SLP Estimated rehab length of stay to reach the above functional goals is: 5-7 days Anticipated discharge destination: Home 10. Overall Rehab/Functional Prognosis: excellent     MD Signature: Rawland Caddy, MD,  [facility] Physical Medicine & Rehabilitation Medical Director Rehabilitation Services 08/23/2023           Revision History

## 2023-08-23 NOTE — Progress Notes (Signed)
 Inpatient Rehabilitation Admissions Coordinator   I have insurance approval and CIR bed to admit her to today. LOOP has been placed this am. Spouse returning from [place] tomorrow. Acute team and TOC made aware.  Jeannetta Millman, RN, MSN Rehab Admissions Coordinator [phone] 08/23/2023 10:19 AM

## 2023-08-23 NOTE — H&P (Signed)
 Physical Medicine and Rehabilitation Admission H&P    Chief Complaint  Patient presents with   Functional deficits due to cerebrovascular Accident    HPI: Amy Hooper is a 73 year old female with history of HTN, T2DM, OSA, CKD IIIb, lumbar surgery w/wound infection, Fibromyalgia who was admitted on 08/18/23 with reports of funny feeling "aura" progressive RLE weakness night prior to admission followed by RUE weakness on awakening. She has history of back surgery and issues with LE spasms. 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 Dr. Christiane Cowing felt that stroke with unclear etiology question cardiac and loop recorder placed today. Patient had stopped taking her ASA due to GERD but willing to resume. He recommended DAPT X 3 weeks followed by ASA alone.   2D echo showed EF 65-70% with no wall abnormality.  BLE dopplers were negative for DVT. PT/OT consulted and patient requiring CGA for mobility and ADLs. She was independent PTA. CIR recommended due to functional decline.     Review of Systems  Constitutional: Negative.   HENT: Negative.    Eyes: Negative.   Respiratory: Negative.    Cardiovascular: Negative.   Gastrointestinal: Negative.   Genitourinary: Negative.   Musculoskeletal:  Positive for neck pain.  Skin: Negative.   Neurological:  Positive for dizziness, focal weakness and headaches.  Psychiatric/Behavioral:  Negative for substance abuse.      Past Medical History:  Diagnosis Date   Allergy 1976   Tramadol  NSAIDS   Chronic kidney disease    Depression    Fibromyalgia    Gout    HTN (hypertension)    Hyperlipidemia 09/01/22   Insomnia    Morbid obesity (HCC)    Non-recurrent acute suppurative otitis media of left ear without spontaneous rupture of  tympanic membrane 04/30/2022   Osteoarthritis    Palpitation    Sleep apnea    Past Surgical History:  Procedure Laterality Date   APPLICATION OF WOUND VAC N/A 03/25/2021   Procedure: APPLICATION OF WOUND VAC;  Surgeon: Adah Acron, MD;  Location: [facility] OR;  Service: Orthopedics;  Laterality: N/A;   APPLICATION OF WOUND VAC N/A 03/30/2021   Procedure: WOUND VAC CHANGE 12x6x5;  Surgeon: Adah Acron, MD;  Location: [facility] OR;  Service: Orthopedics;  Laterality: N/A;   INCISION AND DRAINAGE OF WOUND N/A 03/25/2021   Procedure: LUMBAR POST OP INCISION IRRIGATION;  Surgeon: Adah Acron, MD;  Location: [facility] OR;  Service: Orthopedics;  Laterality: N/A;   JOINT REPLACEMENT     KNEE ARTHROSCOPY     LUMBAR WOUND DEBRIDEMENT N/A 03/30/2021   Procedure: REPEAT LUMBAR WOUND DEBRIDEMENT;  Surgeon: Adah Acron, MD;  Location: [facility] OR;  Service: Orthopedics;  Laterality: N/A;   right rotator cuff     ROTATOR CUFF REPAIR Left    TOTAL KNEE ARTHROPLASTY Left 09/08/2020   Procedure: LEFT TOTAL KNEE ARTHROPLASTY;  Surgeon: Wes Hamman, MD;  Location: [facility] OR;  Service: Orthopedics;  Laterality: Left;   TUBAL LIGATION      Family History  Problem Relation Age of Onset   Hypothyroidism Mother    Hyperlipidemia Mother    Miscarriages / [place] Mother    Hyperlipidemia Other    Kidney failure Father    Dementia Father    Gout Father  Arthritis Father    Prostate cancer Father    Heart disease Father    Hypertension Father    Kidney disease Father    Stroke Father     Social History:  reports that she has never smoked. She has been exposed to tobacco smoke. She has never used smokeless tobacco. She reports that she does not currently use alcohol. She reports that she does not use drugs.   Allergies  Allergen Reactions   Cefuroxime Hives and Rash    Other reaction(s): Unknown  Other Reaction(s): Unknown   Cefuroxime Axetil Rash and Hives   Clarithromycin Hives and Rash    Other reaction(s):  Unknown  Other Reaction(s): Unknown   Nsaids Nausea And Vomiting    Other reaction(s): Unknown  Other Reaction(s): Unknown   Hydrocodone      Other reaction(s): Unknown  Other Reaction(s): Unknown   Hydrocodone -Acetaminophen  Other (See Comments)    Stomach upset  Other Reaction(s): Other  Stomach upset    Stomach upset,  Stomach upset    Stomach upset,     Stomach upset  Stomach upset,   Other Other (See Comments) and Nausea And Vomiting    Upset stomach  Other Reaction(s): Other (See Comments), Unknown  Upset stomach    Upset stomach Other reaction(s): Unknown   Oxycodone -Acetaminophen  Other (See Comments)    Other reaction(s): Unknown  Other Reaction(s): Other, Unknown  Hallucination   Robaxin  [Methocarbamol ] Nausea And Vomiting    "It tears my stomach up."   Toradol  [Ketorolac  Tromethamine ] Nausea And Vomiting   Tramadol  Nausea And Vomiting and Other (See Comments)    Upset stomach  Other reaction(s): Unknown  Other Reaction(s): Other, Unknown  Upset stomach    Upset stomach Other reaction(s): Unknown   Facility-Administered Medications Prior to Admission  Medication Dose Route Frequency Provider Last Rate Last Admin   capsaicin  topical system 8 % patch 1 patch  1 patch Topical Once Raulkar, Krutika P, MD       Medications Prior to Admission  Medication Sig Dispense Refill   allopurinol  (ZYLOPRIM ) 100 MG tablet Take 2 tablets (200 mg total) by mouth daily. 180 tablet 3   amLODipine  (NORVASC ) 5 MG tablet Take 1 tablet (5 mg total) by mouth daily. 90 tablet 3   dapagliflozin  propanediol (FARXIGA ) 5 MG TABS tablet Take 1 tablet (5 mg total) by mouth daily. 90 tablet 1   DULoxetine  (CYMBALTA ) 20 MG capsule TAKE 1 CAPSULE BY MOUTH DAILY (Patient taking differently: Take 20 mg by mouth every evening.) 90 capsule 3   eszopiclone  (LUNESTA ) 1 MG TABS tablet Take 1 tablet (1 mg total) by mouth at bedtime as needed for sleep. Take immediately before bedtime  (Patient taking differently: Take 1 mg by mouth at bedtime.) 90 tablet 1   fluticasone  (FLONASE ) 50 MCG/ACT nasal spray Place 1 spray into both nostrils daily as needed for allergies.     HYDROcodone -acetaminophen  (NORCO) 7.5-325 MG tablet Take 1 tablet by mouth 3 (three) times daily as needed for moderate pain (pain score 4-6). 90 tablet 0   hydrocortisone  (ANUSOL -HC) 2.5 % rectal cream PLACE 1 APPLICATION RECTALLY 2 (TWO) TIMES DAILY. 30 g 3   ketoconazole  (NIZORAL ) 2 % cream Apply 1 Application topically daily. Apply to rash twice a day. If no improvement after 2 weeks, please let provider know. 60 g 0   loratadine  (CLARITIN ) 10 MG tablet Take 10 mg by mouth daily as needed for allergies.     losartan  (COZAAR ) 50 MG tablet Take  1 tablet (50 mg total) by mouth daily. 90 tablet 3   mometasone  (ELOCON ) 0.1 % cream Apply very thin layer to rash at bedtime. May use one other time during the day if needed. Stop after 7 days, if still needed may repeat after 3 day break. 45 g 1   pregabalin  (LYRICA ) 75 MG capsule Take 1 capsule (75 mg total) by mouth 2 (two) times daily. 180 capsule 1   Vitamin D , Ergocalciferol , (DRISDOL ) 1.25 MG (50000 UNIT) CAPS capsule Take 1 capsule (50,000 Units total) by mouth every 7 (seven) days. 12 capsule 1      Home: Home Living Family/patient expects to be discharged to:: Inpatient rehab Living Arrangements: Spouse/significant other Available Help at Discharge: Available PRN/intermittently (spouse is in [place] for the next 2 weeks settling his Mom in from SNF) Type of Home: House Home Access: Stairs to enter [employer] of Steps: 7 Entrance Stairs-Rails: Right, Left Home Layout: One level Bathroom Shower/Tub: Tub/shower unit, Health visitor: Standard Bathroom Accessibility: Yes Home Equipment: Rollator (4 wheels), Cane - single point Additional Comments: spouse in [place] settling his Mom in at home from  SNF. [facility] for 2 weeks  Lives With:  Spouse   Functional History: Prior Function Prior Level of Function : Independent/Modified Independent, Driving Mobility Comments: Ind, denies falls ADLs Comments: ind  Functional Status:  Mobility: Bed Mobility Overal bed mobility: Needs Assistance Bed Mobility: Supine to Sit Supine to sit: Supervision, HOB elevated Sit to supine: Contact guard assist General bed mobility comments: increased time, HOB up and A for safety Transfers Overall transfer level: Needs assistance Equipment used: Rolling walker (2 wheels) Transfers: Sit to/from Stand Sit to Stand: Contact guard assist General transfer comment: CGA with RW Ambulation/Gait Ambulation/Gait assistance: Contact guard assist Gait Distance (Feet): 225 Feet Assistive device: Rolling walker (2 wheels) Gait Pattern/deviations: Step-to pattern, Decreased stride length, Decreased step length - right, Decreased dorsiflexion - right, Ataxic, Step-through pattern, Scissoring General Gait Details: R foot consistently moving medially with ambulation, increased time for turning and decreased foot clearance on R with fall risk Gait velocity: decreased Gait velocity interpretation: <1.8 ft/sec, indicate of risk for recurrent falls    ADL: ADL Overall ADL's : Needs assistance/impaired Eating/Feeding: Independent, Sitting Grooming: Standing, Supervision/safety Upper Body Bathing: Sitting, Set up Lower Body Bathing: Sit to/from stand, Contact guard assist Lower Body Bathing Details (indicate cue type and reason): working on peri-care in session Upper Body Dressing : Sitting, Set up Lower Body Dressing: Sit to/from stand, Contact guard assist Toilet Transfer: Contact guard assist, Rolling walker (2 wheels), Ambulation Toileting- Clothing Manipulation and Hygiene: Contact guard assist, Sit to/from stand Functional mobility during ADLs: Contact guard assist, Rolling walker (2 wheels) General ADL Comments: Patient continues to progress  towards goals. Session focus on assessment of strength in R hand and patient asking to work on peri-care in session. Patient able to complete ADLs at [facility], with compensatory strategies provided to assist with peri-care thoroughness. Patient with improved grasp in session, with [facility] activities provided, as well as blue foam block to work on overall strength. Recommendation remains approprate; OT will continue to follow.  Cognition: Cognition Overall Cognitive Status: Within Functional Limits for tasks assessed Orientation Level: Oriented X4 Cognition Arousal: Alert Behavior During Therapy: WFL for tasks assessed/performed Overall Cognitive Status: Within Functional Limits for tasks assessed  Physical Exam: Blood pressure 127/70, pulse (!) 58, temperature 98.8 F (37.1 C), temperature source Oral, resp. rate 11, height 5\' 2"  (1.575 m), weight  92.2 kg, SpO2 99%. Physical Exam Constitutional:      General: She is not in acute distress.    Appearance: She is not ill-appearing.  HENT:     Head: Normocephalic and atraumatic.     Right Ear: External ear normal.     Left Ear: External ear normal.     Nose: Nose normal.     Mouth/Throat:     Mouth: Mucous membranes are moist.  Eyes:     Extraocular Movements: Extraocular movements intact.     Conjunctiva/sclera: Conjunctivae normal.  Neck:     Comments: See musc/skel Cardiovascular:     Rate and Rhythm: Regular rhythm. Bradycardia present.     Heart sounds: No murmur heard.    No gallop.  Pulmonary:     Effort: Pulmonary effort is normal. No respiratory distress.     Breath sounds: No wheezing or rales.  Abdominal:     General: Bowel sounds are normal. There is no distension.     Tenderness: There is no abdominal tenderness.  Musculoskeletal:        General: Tenderness (mid to upper traps, cervical paraspinals bilaterally.) present. No swelling.     Comments: Upper and mid traps are tight and limit cervical rom to a degree.I did not  appreciate any focal knots or bands per se.   Skin:    General: Skin is warm and dry.     Comments: Old L TKA scar  Neurological:     Mental Status: She is alert.     Comments: Alert and oriented x 3. Normal insight and awareness. Intact Memory. Normal language and speech. Cranial nerve exam unremarkable for ? Partial vision loss in lower right quadrant (vs right inattention). MMT: RUE 4/5 deltoid, bicep, and tricep. LUE 5/5. RLE 2+ to 3- HF, KE and 2 to 2+ ADF/PF. Pt with decreased sensation along sole of right foot (chronic). Did not appreciate sensory loss elsewhere. DTR's 1+, toes down.    Psychiatric:        Mood and Affect: Mood normal.        Behavior: Behavior normal.     No results found for this or any previous visit (from the past 48 hours). EP PPM/ICD IMPLANT Result Date: 08/23/2023 SURGEON:  Efraim Grange, MD   PREPROCEDURE DIAGNOSIS:  Cryptogenic stroke   POSTPROCEDURE DIAGNOSIS:  Cryptogenic stroke    PROCEDURES:  1. Implantable loop recorder implantation   INTRODUCTION:  Lakisa Cozetta Mccard  is a 73 y.o. patient with a history of cryptogenic stroke. Inpatient telemetry has been reviewed and not shown atrial fibrillation. The patient therefore presents today for implantable loop implantation. The costs of loop recorder monitoring have been discussed with the patient.   DESCRIPTION OF PROCEDURE:  Informed written consent was obtained.  A timeout was performed. The patient required no sedation for the procedure today.  The patients left chest was prepped and draped in the usual sterile fashion. The skin overlying the left parasternal region was infiltrated with lidocaine  for local analgesia.  A 0.5-cm incision was made over the left parasternal region over the 3rd intercostal space.  A Medtronic Linq 2 implantable loop recorder was then delivered.  R waves were very prominent and measured >0.88mV.  Steri- Strips and a sterile dressing were then applied.  There were no early apparent  complications.   CONCLUSIONS:  1. Successful implantation of a Medtronic Linq 2 (SN [id number]) implantable loop recorder for a history of cryptogenic stroke  2. No early  apparent complications. Efraim Grange, MD Cardiac Electrophysiology      Blood pressure 127/70, pulse (!) 58, temperature 98.8 F (37.1 C), temperature source Oral, resp. rate 11, height 5\' 2"  (1.575 m), weight 92.2 kg, SpO2 99%.  Medical Problem List and Plan: 1. Functional deficits secondary to left ACA infarct  -patient may shower  -ELOS/Goals: 5-8 days, goals mod I with mobility and self-care 2.  Antithrombotics: -DVT/anticoagulation:  Pharmaceutical: Lovenox   -antiplatelet therapy: DAPT X 3 weeks followed by ASA alone.  3. Headaches/Pain Management: On Fioricet prm --hydrocodone  7.5/325 mg TID prn for LBP  Ford Ide NP) 4. Mood/Behavior/Sleep: LCSW to follow for evaluation and support.   --antipsychotic agents: N/A  --Ambien  for insomnia.  5. Neuropsych/cognition: This patient is  capable of making decisions on her own behalf. 6. Skin/Wound Care:  Routine pressure relief measures.  7. Fluids/Electrolytes/Nutrition: Monitor I/O. Check CMET in am.  8. HTN: Monitor BP TID--on Cozaar  and amlodipine . Avoid hypoperfusion.  9. CKD 111b: SCr baseline 1.4-1.5 range.  10. Dyslipidemia: LDL-136. Had leg pain with Lipitor-->Now  on crestor  trial 11. Fasting hyperglycemia: Hgb A1C-5.0 12. Obesity: BMI 37. Educate on diet and exercise to promote mobility and health.  13. Moderate OSA: CPAP resumed.  14. Fibromyalgia/LE spasms: On Cymbalta  and Lyrica     Zelda Hickman, PA-C 08/23/2023

## 2023-08-23 NOTE — H&P (Signed)
 Physical Medicine and Rehabilitation Admission H&P        Chief Complaint  Patient presents with   Functional deficits due to cerebrovascular Accident      HPI: Amy Hooper is a 73 year old female with history of HTN, T2DM, OSA, CKD IIIb, lumbar surgery w/wound infection, Fibromyalgia who was admitted on 08/18/23 with reports of funny feeling "aura" progressive RLE weakness night prior to admission followed by RUE weakness on awakening. She has history of back surgery and issues with LE spasms. 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 Dr. Christiane Cowing felt that stroke with unclear etiology question cardiac and loop recorder placed today. Patient had stopped taking her ASA due to GERD but willing to resume. He recommended DAPT X 3 weeks followed by ASA alone.    2D echo showed EF 65-70% with no wall abnormality.  BLE dopplers were negative for DVT. PT/OT consulted and patient requiring CGA for mobility and ADLs. She was independent PTA. CIR recommended due to functional decline.      Review of Systems  Constitutional: Negative.   HENT: Negative.    Eyes: Negative.   Respiratory: Negative.    Cardiovascular: Negative.   Gastrointestinal: Negative.   Genitourinary: Negative.   Musculoskeletal:  Positive for neck pain.  Skin: Negative.   Neurological:  Positive for dizziness, focal weakness and headaches.  Psychiatric/Behavioral:  Negative for substance abuse.             Past Medical History:  Diagnosis Date   Allergy 1976    Tramadol  NSAIDS   Chronic kidney disease     Depression     Fibromyalgia     Gout     HTN (hypertension)     Hyperlipidemia 09/01/22   Insomnia     Morbid obesity (HCC)     Non-recurrent acute suppurative otitis media of left ear without  spontaneous rupture of tympanic membrane 04/30/2022   Osteoarthritis     Palpitation     Sleep apnea               Past Surgical History:  Procedure Laterality Date   APPLICATION OF WOUND VAC N/A 03/25/2021    Procedure: APPLICATION OF WOUND VAC;  Surgeon: Adah Acron, MD;  Location: [facility] OR;  Service: Orthopedics;  Laterality: N/A;   APPLICATION OF WOUND VAC N/A 03/30/2021    Procedure: WOUND VAC CHANGE 12x6x5;  Surgeon: Adah Acron, MD;  Location: [facility] OR;  Service: Orthopedics;  Laterality: N/A;   INCISION AND DRAINAGE OF WOUND N/A 03/25/2021    Procedure: LUMBAR POST OP INCISION IRRIGATION;  Surgeon: Adah Acron, MD;  Location: [facility] OR;  Service: Orthopedics;  Laterality: N/A;   JOINT REPLACEMENT       KNEE ARTHROSCOPY       LUMBAR WOUND DEBRIDEMENT N/A 03/30/2021    Procedure: REPEAT LUMBAR WOUND DEBRIDEMENT;  Surgeon: Adah Acron, MD;  Location: [facility] OR;  Service: Orthopedics;  Laterality: N/A;   right rotator cuff       ROTATOR CUFF REPAIR Left     TOTAL KNEE ARTHROPLASTY Left 09/08/2020    Procedure: LEFT TOTAL KNEE ARTHROPLASTY;  Surgeon: Wes Hamman, MD;  Location: [facility] OR;  Service: Orthopedics;  Laterality: Left;   TUBAL LIGATION  Family History  Problem Relation Age of Onset   Hypothyroidism Mother     Hyperlipidemia Mother     Miscarriages / [place] Mother     Hyperlipidemia Other     Kidney failure Father     Dementia Father     Gout Father     Arthritis Father     Prostate cancer Father     Heart disease Father     Hypertension Father     Kidney disease Father     Stroke Father            Social History:  reports that she has never smoked. She has been exposed to tobacco smoke. She has never used smokeless tobacco. She reports that she does not currently use alcohol. She reports that she does not use drugs.     Allergies       Allergies  Allergen Reactions   Cefuroxime Hives and Rash      Other reaction(s): Unknown   Other  Reaction(s): Unknown   Cefuroxime Axetil Rash and Hives   Clarithromycin Hives and Rash      Other reaction(s): Unknown   Other Reaction(s): Unknown   Nsaids Nausea And Vomiting      Other reaction(s): Unknown   Other Reaction(s): Unknown   Hydrocodone         Other reaction(s): Unknown   Other Reaction(s): Unknown   Hydrocodone -Acetaminophen  Other (See Comments)      Stomach upset   Other Reaction(s): Other   Stomach upset    Stomach upset,   Stomach upset    Stomach upset,     Stomach upset  Stomach upset,   Other Other (See Comments) and Nausea And Vomiting      Upset stomach   Other Reaction(s): Other (See Comments), Unknown   Upset stomach    Upset stomach Other reaction(s): Unknown   Oxycodone -Acetaminophen  Other (See Comments)      Other reaction(s): Unknown   Other Reaction(s): Other, Unknown   Hallucination   Robaxin  [Methocarbamol ] Nausea And Vomiting      "It tears my stomach up."   Toradol  [Ketorolac  Tromethamine ] Nausea And Vomiting   Tramadol  Nausea And Vomiting and Other (See Comments)      Upset stomach   Other reaction(s): Unknown   Other Reaction(s): Other, Unknown   Upset stomach    Upset stomach Other reaction(s): Unknown               Facility-Administered Medications Prior to Admission  Medication Dose Route Frequency Provider Last Rate Last Admin   capsaicin  topical system 8 % patch 1 patch  1 patch Topical Once Raulkar, Krutika P, MD                Medications Prior to Admission  Medication Sig Dispense Refill   allopurinol  (ZYLOPRIM ) 100 MG tablet Take 2 tablets (200 mg total) by mouth daily. 180 tablet 3   amLODipine  (NORVASC ) 5 MG tablet Take 1 tablet (5 mg total) by mouth daily. 90 tablet 3   dapagliflozin  propanediol (FARXIGA ) 5 MG TABS tablet Take 1 tablet (5 mg total) by mouth daily. 90 tablet 1   DULoxetine  (CYMBALTA ) 20 MG capsule TAKE 1 CAPSULE BY MOUTH DAILY (Patient taking differently: Take 20 mg by mouth every  evening.) 90 capsule 3   eszopiclone  (LUNESTA ) 1 MG TABS tablet Take 1 tablet (1 mg total) by mouth at bedtime as needed for sleep. Take immediately before bedtime (Patient taking differently: Take 1 mg by mouth  at bedtime.) 90 tablet 1   fluticasone  (FLONASE ) 50 MCG/ACT nasal spray Place 1 spray into both nostrils daily as needed for allergies.       HYDROcodone -acetaminophen  (NORCO) 7.5-325 MG tablet Take 1 tablet by mouth 3 (three) times daily as needed for moderate pain (pain score 4-6). 90 tablet 0   hydrocortisone  (ANUSOL -HC) 2.5 % rectal cream PLACE 1 APPLICATION RECTALLY 2 (TWO) TIMES DAILY. 30 g 3   ketoconazole  (NIZORAL ) 2 % cream Apply 1 Application topically daily. Apply to rash twice a day. If no improvement after 2 weeks, please let provider know. 60 g 0   loratadine  (CLARITIN ) 10 MG tablet Take 10 mg by mouth daily as needed for allergies.       losartan  (COZAAR ) 50 MG tablet Take 1 tablet (50 mg total) by mouth daily. 90 tablet 3   mometasone  (ELOCON ) 0.1 % cream Apply very thin layer to rash at bedtime. May use one other time during the day if needed. Stop after 7 days, if still needed may repeat after 3 day break. 45 g 1   pregabalin  (LYRICA ) 75 MG capsule Take 1 capsule (75 mg total) by mouth 2 (two) times daily. 180 capsule 1   Vitamin D , Ergocalciferol , (DRISDOL ) 1.25 MG (50000 UNIT) CAPS capsule Take 1 capsule (50,000 Units total) by mouth every 7 (seven) days. 12 capsule 1            Home: Home Living Family/patient expects to be discharged to:: Inpatient rehab Living Arrangements: Spouse/significant other Available Help at Discharge: Available PRN/intermittently (spouse is in [place] for the next 2 weeks settling his Mom in from SNF) Type of Home: House Home Access: Stairs to enter [employer] of Steps: 7 Entrance Stairs-Rails: Right, Left Home Layout: One level Bathroom Shower/Tub: Tub/shower unit, Health visitor: Standard Bathroom  Accessibility: Yes Home Equipment: Rollator (4 wheels), Cane - single point Additional Comments: spouse in [place] settling his Mom in at home from  SNF. [facility] for 2 weeks  Lives With: Spouse   Functional History: Prior Function Prior Level of Function : Independent/Modified Independent, Driving Mobility Comments: Ind, denies falls ADLs Comments: ind   Functional Status:  Mobility: Bed Mobility Overal bed mobility: Needs Assistance Bed Mobility: Supine to Sit Supine to sit: Supervision, HOB elevated Sit to supine: Contact guard assist General bed mobility comments: increased time, HOB up and A for safety Transfers Overall transfer level: Needs assistance Equipment used: Rolling walker (2 wheels) Transfers: Sit to/from Stand Sit to Stand: Contact guard assist General transfer comment: CGA with RW Ambulation/Gait Ambulation/Gait assistance: Contact guard assist Gait Distance (Feet): 225 Feet Assistive device: Rolling walker (2 wheels) Gait Pattern/deviations: Step-to pattern, Decreased stride length, Decreased step length - right, Decreased dorsiflexion - right, Ataxic, Step-through pattern, Scissoring General Gait Details: R foot consistently moving medially with ambulation, increased time for turning and decreased foot clearance on R with fall risk Gait velocity: decreased Gait velocity interpretation: <1.8 ft/sec, indicate of risk for recurrent falls   ADL: ADL Overall ADL's : Needs assistance/impaired Eating/Feeding: Independent, Sitting Grooming: Standing, Supervision/safety Upper Body Bathing: Sitting, Set up Lower Body Bathing: Sit to/from stand, Contact guard assist Lower Body Bathing Details (indicate cue type and reason): working on peri-care in session Upper Body Dressing : Sitting, Set up Lower Body Dressing: Sit to/from stand, Contact guard assist Toilet Transfer: Contact guard assist, Rolling walker (2 wheels), Ambulation Toileting- Clothing Manipulation and  Hygiene: Contact guard assist, Sit to/from stand Functional mobility during ADLs:  Contact guard assist, Rolling walker (2 wheels) General ADL Comments: Patient continues to progress towards goals. Session focus on assessment of strength in R hand and patient asking to work on peri-care in session. Patient able to complete ADLs at [facility], with compensatory strategies provided to assist with peri-care thoroughness. Patient with improved grasp in session, with [facility] activities provided, as well as blue foam block to work on overall strength. Recommendation remains approprate; OT will continue to follow.   Cognition: Cognition Overall Cognitive Status: Within Functional Limits for tasks assessed Orientation Level: Oriented X4 Cognition Arousal: Alert Behavior During Therapy: WFL for tasks assessed/performed Overall Cognitive Status: Within Functional Limits for tasks assessed   Physical Exam: Blood pressure 127/70, pulse (!) 58, temperature 98.8 F (37.1 C), temperature source Oral, resp. rate 11, height 5\' 2"  (1.575 m), weight 92.2 kg, SpO2 99%. Physical Exam Constitutional:      General: She is not in acute distress.    Appearance: She is not ill-appearing.  HENT:     Head: Normocephalic and atraumatic.     Right Ear: External ear normal.     Left Ear: External ear normal.     Nose: Nose normal.     Mouth/Throat:     Mouth: Mucous membranes are moist.  Eyes:     Extraocular Movements: Extraocular movements intact.     Conjunctiva/sclera: Conjunctivae normal.  Neck:     Comments: See musc/skel Cardiovascular:     Rate and Rhythm: Regular rhythm. Bradycardia present.     Heart sounds: No murmur heard.    No gallop.  Pulmonary:     Effort: Pulmonary effort is normal. No respiratory distress.     Breath sounds: No wheezing or rales.  Abdominal:     General: Bowel sounds are normal. There is no distension.     Tenderness: There is no abdominal tenderness.  Musculoskeletal:         General: Tenderness (mid to upper traps, cervical paraspinals bilaterally.) present. No swelling.     Comments: Upper and mid traps are tight and limit cervical rom to a degree.I did not appreciate any focal knots or bands per se.   Skin:    General: Skin is warm and dry.     Comments: Old L TKA scar  Neurological:     Mental Status: She is alert.     Comments: Alert and oriented x 3. Normal insight and awareness. Intact Memory. Normal language and speech. Cranial nerve exam unremarkable for ? Partial vision loss in lower right quadrant (vs right inattention). MMT: RUE 4/5 deltoid, bicep, and tricep. LUE 5/5. RLE 2+ to 3- HF, KE and 2 to 2+ ADF/PF. Pt with decreased sensation along sole of right foot (chronic). Did not appreciate sensory loss elsewhere. DTR's 1+, toes down.    Psychiatric:        Mood and Affect: Mood normal.        Behavior: Behavior normal.        Lab Results Last 48 Hours  No results found for this or any previous visit (from the past 48 hours).    Imaging Results (Last 48 hours)  EP PPM/ICD IMPLANT Result Date: 08/23/2023 SURGEON:  Efraim Grange, MD   PREPROCEDURE DIAGNOSIS:  Cryptogenic stroke   POSTPROCEDURE DIAGNOSIS:  Cryptogenic stroke    PROCEDURES:  1. Implantable loop recorder implantation   INTRODUCTION:  Fletcher Cozetta Mcfadyen  is a 73 y.o. patient with a history of cryptogenic stroke. Inpatient telemetry has been reviewed  and not shown atrial fibrillation. The patient therefore presents today for implantable loop implantation. The costs of loop recorder monitoring have been discussed with the patient.   DESCRIPTION OF PROCEDURE:  Informed written consent was obtained.  A timeout was performed. The patient required no sedation for the procedure today.  The patients left chest was prepped and draped in the usual sterile fashion. The skin overlying the left parasternal region was infiltrated with lidocaine  for local analgesia.  A 0.5-cm incision was made over the  left parasternal region over the 3rd intercostal space.  A Medtronic Linq 2 implantable loop recorder was then delivered.  R waves were very prominent and measured >0.45mV.  Steri- Strips and a sterile dressing were then applied.  There were no early apparent complications.   CONCLUSIONS:  1. Successful implantation of a Medtronic Linq 2 (SN [id number]) implantable loop recorder for a history of cryptogenic stroke  2. No early apparent complications. Efraim Grange, MD Cardiac Electrophysiology           Blood pressure 127/70, pulse (!) 58, temperature 98.8 F (37.1 C), temperature source Oral, resp. rate 11, height 5\' 2"  (1.575 m), weight 92.2 kg, SpO2 99%.   Medical Problem List and Plan: 1. Functional deficits secondary to left ACA infarct             -patient may shower             -ELOS/Goals: 5-8 days, goals mod I with mobility and self-care 2.  Antithrombotics: -DVT/anticoagulation:  Pharmaceutical: Lovenox              -antiplatelet therapy: DAPT X 3 weeks followed by ASA alone.  3. Headaches/Pain Management: On Fioricet prm --hydrocodone  7.5/325 mg TID prn for LBP  Ford Ide NP) -pt has a lot of pain in her traps and lower neck which generates headaches.  -continue cymbalta  and lyrica  as per home regimen  -add scheduled flexeril 5mg  at bedtime as well.   -kpad  4. Mood/Behavior/Sleep: LCSW to follow for evaluation and support.              --antipsychotic agents: N/A             --Ambien  for insomnia.  5. Neuropsych/cognition: This patient is  capable of making decisions on her own behalf. 6. Skin/Wound Care:  Routine pressure relief measures.  7. Fluids/Electrolytes/Nutrition: Monitor I/O. Check CMET in am.  8. HTN: Monitor BP TID--on Cozaar  and amlodipine . Avoid hypoperfusion.  9. CKD IIIb: SCr baseline 1.4-1.5 range.  10. Dyslipidemia: LDL-136. Had leg pain with Lipitor-->Now  on crestor  trial 11. Fasting hyperglycemia: Hgb A1C-5.0 12. Obesity: BMI 37. Educate on diet  and exercise to promote mobility and health.  13. Moderate OSA: CPAP resumed.  14. Fibromyalgia/LE spasms: On Cymbalta  and Lyrica       Zelda Hickman, PA-C 08/23/2023  I have personally performed a face to face diagnostic evaluation of this patient and formulated the key components of the plan.  Additionally, I have personally reviewed laboratory data, imaging studies, as well as relevant notes and concur with the physician assistant's documentation above.  The patient's status has not changed from the original H&P.  Any changes in documentation from the acute care chart have been noted above.  Rawland Caddy, MD, [facility]

## 2023-08-23 NOTE — TOC Transition Note (Signed)
 Transition of Care [facility]) - Discharge Note   Patient Details  Name: Amy Hooper MRN: [id number] Date of Birth: May 08, 1950  [facility] CM/SW Contact:  Jonathan Neighbor, RN Phone Number: 08/23/2023, 10:19 AM   Clinical Narrative:     Pt is discharging to CIR today. CM signing off.    Final next level of care: IP Rehab Facility Barriers to Discharge: No Barriers Identified   Patient Goals and CMS Choice   CMS Medicare.gov Compare Post Acute Care list provided to:: Patient Choice offered to / list presented to : Patient      Discharge Placement                       Discharge Plan and Services Additional resources added to the After Visit Summary for                                       Social Drivers of Health (SDOH) Interventions SDOH Screenings   Food Insecurity: No Food Insecurity (08/18/2023)  Housing: Low Risk  (08/18/2023)  Transportation Needs: No Transportation Needs (08/18/2023)  Utilities: Not At Risk (08/18/2023)  Depression (PHQ2-9): Low Risk  (06/20/2023)  Recent Concern: Depression (PHQ2-9) - High Risk (04/25/2023)  Financial Resource Strain: Low Risk  (07/25/2023)  Physical Activity: Inactive (07/25/2023)  Social Connections: Moderately Integrated (08/18/2023)  Recent Concern: Social Connections - Moderately Isolated (07/25/2023)  Stress: Stress Concern Present (07/25/2023)  Tobacco Use: Medium Risk (08/18/2023)     Readmission Risk Interventions     No data to display

## 2023-08-24 DIAGNOSIS — I63522 Cerebral infarction due to unspecified occlusion or stenosis of left anterior cerebral artery: Secondary | ICD-10-CM | POA: Diagnosis not present

## 2023-08-24 LAB — COMPREHENSIVE METABOLIC PANEL WITH GFR
ALT: 16 U/L (ref 0–44)
AST: 18 U/L (ref 15–41)
Albumin: 2.9 g/dL — ABNORMAL LOW (ref 3.5–5.0)
Alkaline Phosphatase: 79 U/L (ref 38–126)
Anion gap: 9 (ref 5–15)
BUN: 25 mg/dL — ABNORMAL HIGH (ref 8–23)
CO2: 25 mmol/L (ref 22–32)
Calcium: 9.1 mg/dL (ref 8.9–10.3)
Chloride: 105 mmol/L (ref 98–111)
Creatinine, Ser: 1.37 mg/dL — ABNORMAL HIGH (ref 0.44–1.00)
GFR, Estimated: 42 mL/min — ABNORMAL LOW (ref >60)
Glucose, Bld: 85 mg/dL (ref 70–99)
Potassium: 4.6 mmol/L (ref 3.5–5.1)
Sodium: 139 mmol/L (ref 135–145)
Total Bilirubin: 0.5 mg/dL (ref 0.0–1.2)
Total Protein: 6.1 g/dL — ABNORMAL LOW (ref 6.5–8.1)

## 2023-08-24 LAB — CBC WITH DIFFERENTIAL/PLATELET
Abs Immature Granulocytes: 0.01 10*3/uL (ref 0.00–0.07)
Basophils Absolute: 0 10*3/uL (ref 0.0–0.1)
Basophils Relative: 0 %
Eosinophils Absolute: 0.3 10*3/uL (ref 0.0–0.5)
Eosinophils Relative: 5 %
HCT: 32.2 % — ABNORMAL LOW (ref 36.0–46.0)
Hemoglobin: 11 g/dL — ABNORMAL LOW (ref 12.0–15.0)
Immature Granulocytes: 0 %
Lymphocytes Relative: 43 %
Lymphs Abs: 2.7 10*3/uL (ref 0.7–4.0)
MCH: 30 pg (ref 26.0–34.0)
MCHC: 34.2 g/dL (ref 30.0–36.0)
MCV: 87.7 fL (ref 80.0–100.0)
Monocytes Absolute: 0.6 10*3/uL (ref 0.1–1.0)
Monocytes Relative: 10 %
Neutro Abs: 2.6 10*3/uL (ref 1.7–7.7)
Neutrophils Relative %: 42 %
Platelets: 272 10*3/uL (ref 150–400)
RBC: 3.67 MIL/uL — ABNORMAL LOW (ref 3.87–5.11)
RDW: 13.5 % (ref 11.5–15.5)
WBC: 6.2 10*3/uL (ref 4.0–10.5)
nRBC: 0 % (ref 0.0–0.2)

## 2023-08-24 MED ORDER — ZOLPIDEM TARTRATE 5 MG PO TABS
5.0000 mg | ORAL_TABLET | Freq: Every day | ORAL | Status: DC
  Administered 2023-08-24 – 2023-08-26 (×3): 5 mg via ORAL
  Filled 2023-08-24 (×3): qty 1

## 2023-08-24 NOTE — Progress Notes (Signed)
 Orthopedic Tech Progress Note Patient Details:  Amy Hooper May 14, 1950 [id number]  Called in order to [facility] for an ANKLE FOOT ORTHOSIS   Patient ID: Amy Hooper, female   DOB: March 23, 1950, 73 y.o.   MRN: [id number]  Amy Hooper 08/24/2023, 11:48 AM

## 2023-08-24 NOTE — Progress Notes (Signed)
 Inpatient Rehabilitation Admission Medication Review by a Pharmacist  A complete drug regimen review was completed for this patient to identify any potential clinically significant medication issues.  High Risk Drug Classes Is patient taking? Indication by Medication  Antipsychotic Yes, as an intravenous medication Compazine - nausea  Anticoagulant Yes Lovenox  - VTE prophylaxis  Antibiotic No   Opioid Yes Hydrocodone -APAP- pain  Antiplatelet Yes Aspirin , plavix  for 3 weeks then ASA alone  -acute CVA Plavix  end date is 09/09/23.  Hypoglycemics/insulin No   Vasoactive Medication Yes Losartan - HTN  Chemotherapy No   Other Yes APAP, fioricet, capsaicin  cream - pain management/ headaches Allopurinol  - gout Cyclobenzaprine- muscle spasms Duloxetine , lyrica  - Fibromyalgia/ LE spasms ,headaches due to lower pain in neck. Farxiga  - T2DM Loratadine , flonase  NS - seasonal allergies  Melatonin, zolpidem - imsonia   Rosuvastatin  - HLD     Type of Medication Issue Identified Description of Issue Recommendation(s)  Drug Interaction(s) (clinically significant)     Duplicate Therapy     Allergy     No Medication Administration End Date  Plavix  end date is 09/09/2023 Communicate to patient /family/ caregiver prior to discharge.  Incorrect Dose     Additional Drug Therapy Needed     Significant med changes from prior encounter (inform family/care partners about these prior to discharge). PTA medication: Amlodipine  not resumed on CIR admission Restart PTA meds when and if necessary during CIR admission or at time of discharge, if warranted   Other       Clinically significant medication issues were identified that warrant physician communication and completion of prescribed/recommended actions by midnight of the next day:  No  Name of provider notified for urgent issues identified:   Provider Method of Notification:     Pharmacist comments:   Time spent performing this drug regimen  review (minutes):  25  Alisa Irish, RPh Clinical Pharmacist  08/24/2023 1:57 PM

## 2023-08-24 NOTE — Evaluation (Signed)
 Occupational Therapy Assessment and Plan  Patient Details  Name: Amy Hooper MRN: [id number] Date of Birth: 1950-12-08  OT Diagnosis: hemiplegia affecting dominant side and muscle weakness (generalized) Rehab Potential: Rehab Potential (ACUTE ONLY): Good ELOS: 3-5 days   Today's Date: 08/24/2023 OT Individual Time: 0945-1100 OT Individual Time Calculation (min): 75 min     Hospital Problem: Principal Problem:   Acute ischemic left ACA stroke (HCC)   Past Medical History:  Past Medical History:  Diagnosis Date   Allergy 1976   Tramadol  NSAIDS   Chronic kidney disease    Depression    Fibromyalgia    Gout    HTN (hypertension)    Hyperlipidemia 09/01/22   Insomnia    Morbid obesity (HCC)    Non-recurrent acute suppurative otitis media of left ear without spontaneous rupture of tympanic membrane 04/30/2022   Osteoarthritis    Palpitation    Sleep apnea    Past Surgical History:  Past Surgical History:  Procedure Laterality Date   APPLICATION OF WOUND VAC N/A 03/25/2021   Procedure: APPLICATION OF WOUND VAC;  Surgeon: Adah Acron, MD;  Location: [facility] OR;  Service: Orthopedics;  Laterality: N/A;   APPLICATION OF WOUND VAC N/A 03/30/2021   Procedure: WOUND VAC CHANGE 12x6x5;  Surgeon: Adah Acron, MD;  Location: [facility] OR;  Service: Orthopedics;  Laterality: N/A;   INCISION AND DRAINAGE OF WOUND N/A 03/25/2021   Procedure: LUMBAR POST OP INCISION IRRIGATION;  Surgeon: Adah Acron, MD;  Location: [facility] OR;  Service: Orthopedics;  Laterality: N/A;   JOINT REPLACEMENT     KNEE ARTHROSCOPY     LOOP RECORDER INSERTION N/A 08/23/2023   Procedure: LOOP RECORDER INSERTION;  Surgeon: Efraim Grange, MD;  Location: [facility];  Service: Cardiovascular;  Laterality: N/A;   LUMBAR WOUND DEBRIDEMENT N/A 03/30/2021   Procedure: REPEAT LUMBAR WOUND DEBRIDEMENT;  Surgeon: Adah Acron, MD;  Location: [facility] OR;  Service: Orthopedics;  Laterality: N/A;   right rotator cuff     ROTATOR  CUFF REPAIR Left    TOTAL KNEE ARTHROPLASTY Left 09/08/2020   Procedure: LEFT TOTAL KNEE ARTHROPLASTY;  Surgeon: Wes Hamman, MD;  Location: [facility];  Service: Orthopedics;  Laterality: Left;   TUBAL LIGATION      Assessment & Plan Clinical Impression: Amy Hooper is a 73 year old female with history of HTN, T2DM, OSA, CKD IIIb, lumbar surgery w/wound infection, Fibromyalgia who was admitted on 08/18/23 with reports of funny feeling "aura" progressive RLE weakness night prior to admission followed by RUE weakness on awakening. She has history of back surgery and issues with LE spasms. 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 Dr. Christiane Cowing felt that stroke with unclear etiology question cardiac and loop recorder placed today. Patient transferred to CIR on 08/23/2023 .    Patient currently requires min with basic self-care skills secondary to muscle weakness and unbalanced muscle activation.  Prior to hospitalization, patient could complete BADL/IADL with independent .  Patient will benefit from skilled intervention to decrease level of assist with basic self-care skills and increase independence with basic self-care skills prior to discharge home with care partner.  Anticipate patient will require intermittent supervision and follow up outpatient.  OT - End of Session Activity Tolerance: Decreased this session  Endurance Deficit: Yes OT Assessment Rehab Potential (ACUTE ONLY): Good OT Patient demonstrates impairments in the following area(s): Balance;Motor;Pain;Endurance OT Basic ADL's Functional Problem(s): Bathing;Dressing;Toileting OT Transfers Functional Problem(s): Toilet;Tub/Shower OT Additional Impairment(s): None OT Plan OT Intensity: Minimum of 1-2 x/day, 45 to 90  minutes OT Frequency: 5 out of 7 days OT Duration/Estimated Length of Stay: 3-5 days OT Treatment/Interventions: Balance/vestibular training;Disease mangement/prevention;Neuromuscular re-education;Patient/family education;Self Care/advanced ADL retraining;Therapeutic Exercise;UE/LE Coordination activities;UE/LE Strength taining/ROM;Therapeutic Activities;Pain management;Functional mobility training;DME/adaptive equipment instruction;Discharge planning OT Self Feeding Anticipated Outcome(s): Independent OT Basic Self-Care Anticipated Outcome(s): Modified Independent OT Toileting Anticipated Outcome(s): Modified Independent OT Bathroom Transfers Anticipated Outcome(s): Modified Independent OT Recommendation Recommendations for Other Services: Neuropsych consult Patient destination: Home Follow Up Recommendations: Outpatient OT Equipment Recommended: To be determined   OT Evaluation Precautions/Restrictions  Precautions Precautions: Fall Precaution/Restrictions Comments: loop recorder Restrictions Weight Bearing Restrictions Per Provider Order: No  Pain Pain Assessment Pain Score: 4  Pain Type: Acute pain Pain Location: Chest Pain Orientation: Left Pain Onset: On-going Patients Stated Pain Goal: 0 Pain Intervention(s): Repositioned Multiple Pain Sites: No Home Living/Prior Functioning Home Living Family/patient expects to be discharged to:: Private residence Living Arrangements: Spouse/significant other Available Help at Discharge: Family, Available 24 hours/day Type of Home: House Home Access: Stairs to enter [employer] of Steps: 7 Entrance Stairs-Rails: Right, Left Home Layout: One level Bathroom Shower/Tub: Tub/shower unit, Psychologist, counselling, Door [employer] Toilet: Standard Bathroom Accessibility: Yes Additional Comments: spouse in [place] settling his Mom in at home from  SNF. Gone for 2 weeks. 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[place] [place] Baseline Vision/History: 1 Wears glasses;4 Cataracts Ability to See in Adequate Light: 0 Adequate Patient Visual Report: No change from baseline;Peripheral vision impairment;Other (comment) Vision Assessment?: No apparent visual deficits Perception  Perception: Within Functional Limits Praxis Praxis: WFL Cognition Cognition Overall Cognitive Status: Within Functional Limits for tasks assessed Arousal/Alertness: Awake/alert Orientation Level: Person;Place;Situation Person: Oriented Place: Oriented Situation: Oriented Memory: Appears intact Attention: Selective Selective Attention: Appears intact Awareness: Appears intact Problem Solving: Appears intact Behaviors: Other (comment) (reportss mild anxiety - not apparent during eval) Safety/Judgment: Appears intact Brief Interview for Mental Status (BIMS) Repetition of Three Words (First Attempt): 3 Temporal Orientation: Year: Correct Temporal Orientation: Month: Accurate within 5 days Temporal Orientation: Day: Correct Recall: "Sock": Yes, no cue required Recall: "Blue": Yes, no cue required Recall: "Bed": Yes, no cue required BIMS Summary Score: 15 Sensation Sensation Light Touch: Appears Intact Hot/Cold: Appears Intact Proprioception: Appears Intact Stereognosis: Not tested Coordination Gross Motor Movements are Fluid and Coordinated: No Fine Motor Movements are Fluid and Coordinated: Yes Coordination and Movement Description: slower movement throughout R side 9 Hole Peg Test: 33.77 right  29.71 left Motor  Motor Motor: Hemiplegia Motor - Skilled Clinical Observations: mild on R  Trunk/Postural Assessment  Cervical Assessment Cervical  Assessment: Within Functional Limits Thoracic Assessment Thoracic Assessment: Within Functional Limits Lumbar Assessment Lumbar Assessment: Within Functional Limits Postural Control Postural Control: Within Functional Limits  Balance Balance Balance Assessed: Yes Standardized Balance Assessment Standardized Balance Assessment: (P) Berg Balance Test Berg Balance Test Sit to Stand: (P) Able to stand without using hands and stabilize independently Standing Unsupported: (P) Able to stand safely 2 minutes Sitting with Back Unsupported but Feet Supported on Floor  or Stool: (P) Able to sit safely and securely 2 minutes Stand to Sit: (P) Sits safely with minimal use of hands Transfers: (P) Able to transfer safely, minor use of hands Standing Unsupported with Eyes Closed: (P) Able to stand 10 seconds safely Standing Ubsupported with Feet Together: (P) Able to place feet together independently and stand 1 minute safely From Standing, Reach Forward with Outstretched Arm: (P) Can reach confidently >25 cm (10") From Standing Position, Pick up Object from Floor: (P) Able to pick up shoe safely and easily From Standing Position, Turn to Look Behind Over each Shoulder: (P) Looks behind from both sides and weight shifts well Turn 360 Degrees: (P) Able to turn 360 degrees safely in 4 seconds or less Standing Unsupported, Alternately Place Feet on Step/Stool: (P) Able to stand independently and complete 8 steps >20 seconds Standing Unsupported, One Foot in Front: (P) Able to place foot tandem independently and hold 30 seconds Standing on One Leg: (P) Able to lift leg independently and hold 5-10 seconds Total Score: (P) 54 Dynamic Gait Index Level Surface: (P) Mild Impairment Static Sitting Balance Static Sitting - Balance Support: Feet supported;No upper extremity supported Static Sitting - Level of Assistance: 7: Independent Dynamic Sitting Balance Dynamic Sitting - Balance Support: During functional  activity;No upper extremity supported Dynamic Sitting - Level of Assistance: 7: Independent Static Standing Balance Static Standing - Balance Support: Right upper extremity supported;Left upper extremity supported;During functional activity Static Standing - Level of Assistance: 5: Stand by assistance Dynamic Standing Balance Dynamic Standing - Balance Support: During functional activity;Right upper extremity supported;Left upper extremity supported Dynamic Standing - Level of Assistance: 5: Stand by assistance Extremity/Trunk Assessment RUE Assessment RUE Assessment: Within Functional Limits Active Range of Motion (AROM) Comments: Grossly WFL - Limited end range shoulder flexion General Strength Comments: 4-/5  Grasp - 45 lb LUE Assessment LUE Assessment: Within Functional Limits Active Range of Motion (AROM) Comments: Grossly WFL - Slightly reduced at end range flexion General Strength Comments: 4+/5  Grasp - 62 lb  Care Tool Care Tool Self Care Eating   Eating Assist Level: Set up assist    Oral Care    Oral Care Assist Level: Set up assist    Bathing   Body parts bathed by patient: Right arm;Left arm;Chest;Abdomen;Front perineal area;Buttocks;Right upper leg;Left upper leg;Right lower leg;Left lower leg;Face     Assist Level: Contact Guard/Touching assist    Upper Body Dressing(including orthotics)   What is the patient wearing?: Bra;Pull over shirt   Assist Level: Set up assist    Lower Body Dressing (excluding footwear)   What is the patient wearing?: Underwear/pull up;Pants Assist for lower body dressing: Contact Guard/Touching assist    Putting on/Taking off footwear   What is the patient wearing?: Socks;Shoes Assist for footwear: Set up assist       Care Tool Toileting Toileting activity   Assist for toileting: Contact Guard/Touching assist     Care Tool Bed Mobility Roll left and right activity   Roll left and right assist level: Supervision/Verbal  cueing    Sit to lying activity   Sit to lying assist level: Independent with assistive device    Lying to sitting on side of bed activity   Lying to sitting on side of bed assist level: the ability to move from lying on the back to sitting on the side of the bed with no back support.: Independent with assistive device     Care Tool Transfers Sit to  stand transfer   Sit to stand assist level: Supervision/Verbal cueing    Chair/bed transfer   Chair/bed transfer assist level: Supervision/Verbal cueing     Toilet transfer   Assist Level: Contact Guard/Touching assist     Care Tool Cognition  Expression of Ideas and Wants Expression of Ideas and Wants: 4. Without difficulty (complex and basic) - expresses complex messages without difficulty and with speech that is clear and easy to understand  Understanding Verbal and Non-Verbal Content Understanding Verbal and Non-Verbal Content: 4. Understands (complex and basic) - clear comprehension without cues or repetitions   Memory/Recall Ability Memory/Recall Ability : Current season;Location of own room;Staff names and faces;That he or she is in a hospital/hospital unit   Refer to Care Plan for Long Term Goals  SHORT TERM GOAL WEEK 1 OT Short Term Goal 1 (Week 1): STG=LTG due to LOS - Overall goal of Modified I with BADL  Recommendations for other services: Neuropsych   Skilled Therapeutic Intervention:  Patient received sitting in recliner after PT session.  Patient able to explain series of events leading up to stroke.  Patient very talkative - but remains mostly on topic.  Patient initially wanting to take a shower but recalled instructions to wait x 3 days due to loop recorder.  Patient washed at sink as indicated below.  Patient pleased with her performance overall, reports some anxiety with "testing" procedures - but not evident by her behavior.  Patient reports husband can be home within 1.5 days once she knows her discharge date.   Husband currently in [place] helping mom resettle after SNF discharge.  Patient walked to gym without device with cueing and CGA.  Patient left up in wheelchair with personal items in reach.   ADL ADL Eating: Not assessed Where Assessed-Eating: Chair Grooming: Setup Where Assessed-Grooming: Standing at sink Upper Body Bathing: Setup Where Assessed-Upper Body Bathing: Sitting at sink Lower Body Bathing: Contact guard Where Assessed-Lower Body Bathing: Standing at sink;Sitting at sink Upper Body Dressing: Setup Where Assessed-Upper Body Dressing: Sitting at sink Lower Body Dressing: Contact guard Where Assessed-Lower Body Dressing: Standing at sink;Sitting at sink Toileting: Contact guard Where Assessed-Toileting: Teacher, adult education: Furniture conservator/restorer Method: Insurance claims handler: Unable to assess Tub/Shower Transfer Method: Unable to assess Film/video editor: Unable to assess Film/video editor Method: Unable to assess ADL Comments: Loop recorder implanted yesterday - no shower Mobility  Bed Mobility Bed Mobility: Sit to Supine;Supine to Sit Supine to Sit: Independent with assistive device Sit to Supine: Independent with assistive device Transfers Sit to Stand: Supervision/Verbal cueing Stand to Sit: Supervision/Verbal cueing   Discharge Criteria: Patient will be discharged from OT if patient refuses treatment 3 consecutive times without medical reason, if treatment goals not met, if there is a change in medical status, if patient makes no progress towards goals or if patient is discharged from hospital.  The above assessment, treatment plan, treatment alternatives and goals were discussed and mutually agreed upon: by patient  Mila Alexandria 08/24/2023, 3:29 PM

## 2023-08-24 NOTE — Progress Notes (Signed)
 [facility] Individual Statement of Services  Patient Name:  Amy Hooper  Date:  08/24/2023  Welcome to the [facility].  Our goal is to provide you with an individualized program based on your diagnosis and situation, designed to meet your specific needs.  With this comprehensive rehabilitation program, you will be expected to participate in at least 3 hours of rehabilitation therapies Monday-Friday, with modified therapy programming on the weekends.  Your rehabilitation program will include the following services:  Physical Therapy (PT), Occupational Therapy (OT), 24 hour per day rehabilitation nursing, Therapeutic Recreaction (TR), Care Coordinator, Rehabilitation Medicine, Nutrition Services, and Pharmacy Services  Weekly team conferences will be held on wednesday to discuss your progress.  Your Inpatient Rehabilitation Care Coordinator will talk with you frequently to get your input and to update you on team discussions.  Team conferences with you and your family in attendance may also be held.  Expected length of stay: 3-5 days  Overall anticipated outcome: mod/I level  Depending on your progress and recovery, your program may change. Your Inpatient Rehabilitation Care Coordinator will coordinate services and will keep you informed of any changes. Your Inpatient Rehabilitation Care Coordinator's name and contact numbers are listed  below.  The following services may also be recommended but are not provided by the Inpatient [facility]:  Driving Evaluations Home Health Rehabiltiation Services Outpatient Rehabilitation Services Vocational Rehabilitation   Arrangements will be made to provide these services after discharge if needed.  Arrangements include referral to agencies that provide these services.  Your insurance has been verified to be:  Medicare & [employer] supplement Your primary doctor is:  Abraham Hoffmann Early  Pertinent  information will be shared with your doctor and your insurance company.  Inpatient Rehabilitation Care Coordinator:  Adrianna Albee, [place] [phone] or Justine Oms  Information discussed with and copy given to patient by: [name], 08/24/2023, 1:39 PM

## 2023-08-24 NOTE — Progress Notes (Signed)
 PROGRESS NOTE   Subjective/Complaints: No new complaints this morning Has chronic pain, takes hydrocodone , Lyrica , would like right knee sleeve for her OA  ROS: right knee OA pain   Objective:   EP PPM/ICD IMPLANT Result Date: 08/23/2023 SURGEON:  Efraim Grange, MD   PREPROCEDURE DIAGNOSIS:  Cryptogenic stroke   POSTPROCEDURE DIAGNOSIS:  Cryptogenic stroke    PROCEDURES:  1. Implantable loop recorder implantation   INTRODUCTION:  Amy Hooper  is a 73 y.o. patient with a history of cryptogenic stroke. Inpatient telemetry has been reviewed and not shown atrial fibrillation. The patient therefore presents today for implantable loop implantation. The costs of loop recorder monitoring have been discussed with the patient.   DESCRIPTION OF PROCEDURE:  Informed written consent was obtained.  A timeout was performed. The patient required no sedation for the procedure today.  The patients left chest was prepped and draped in the usual sterile fashion. The skin overlying the left parasternal region was infiltrated with lidocaine  for local analgesia.  A 0.5-cm incision was made over the left parasternal region over the 3rd intercostal space.  A Medtronic Linq 2 implantable loop recorder was then delivered.  R waves were very prominent and measured >0.60mV.  Steri- Strips and a sterile dressing were then applied.  There were no early apparent complications.   CONCLUSIONS:  1. Successful implantation of a Medtronic Linq 2 (SN [id number]) implantable loop recorder for a history of cryptogenic stroke  2. No early apparent complications. Efraim Grange, MD Cardiac Electrophysiology   Recent Labs    08/24/23 0505  WBC 6.2  HGB 11.0*  HCT 32.2*  PLT 272   Recent Labs    08/24/23 0505  NA 139  K 4.6  CL 105  CO2 25  GLUCOSE 85  BUN 25*  CREATININE 1.37*  CALCIUM  9.1    Intake/Output Summary (Last 24 hours) at 08/24/2023 1120 Last  data filed at 08/24/2023 0700 Gross per 24 hour  Intake 300 ml  Output --  Net 300 ml        Physical Exam: Vital Signs Blood pressure (!) 112/58, pulse (!) 50, temperature 98.1 F (36.7 C), temperature source Oral, resp. rate 16, height 5\' 2"  (1.575 m), weight 92.2 kg, SpO2 99%. Gen: no distress, normal appearing HEENT: oral mucosa pink and moist, NCAT Cardiovascular:     Rate and Rhythm: Regular rhythm. Bradycardia present.     Heart sounds: No murmur heard.    No gallop.  Pulmonary:     Effort: Pulmonary effort is normal. No respiratory distress.     Breath sounds: No wheezing or rales.  Abdominal:     General: Bowel sounds are normal. There is no distension.     Tenderness: There is no abdominal tenderness.  Musculoskeletal:        General: Tenderness (mid to upper traps, cervical paraspinals bilaterally.) present. No swelling.     Comments: Upper and mid traps are tight and limit cervical rom to a degree.I did not appreciate any focal knots or bands per se.   Skin:    General: Skin is warm and dry.     Comments: Old L TKA scar  Neurological:     Mental Status: She is alert.     Comments: Alert and oriented x 3. Normal insight and awareness. Intact Memory. Normal language and speech. Cranial nerve exam unremarkable for ? Partial vision loss in lower right quadrant (vs right inattention). MMT: RUE 4/5 deltoid, bicep, and tricep. LUE 5/5. RLE 2+ to 3- HF, KE and 2 to 2+ ADF/PF. Pt with decreased sensation along sole of right foot (chronic). Did not appreciate sensory loss elsewhere. DTR's 1+, toes down.    Psychiatric:        Mood and Affect: Mood normal.        Behavior: Behavior normal.     Assessment/Plan: 1. Functional deficits which require 3+ hours per day of interdisciplinary therapy in a comprehensive inpatient rehab setting. Physiatrist is providing close team supervision and 24 hour management of active medical problems listed below. Physiatrist and rehab team  continue to assess barriers to discharge/monitor patient progress toward functional and medical goals  Care Tool:  Bathing              Bathing assist       Upper Body Dressing/Undressing Upper body dressing        Upper body assist      Lower Body Dressing/Undressing Lower body dressing            Lower body assist       Toileting Toileting    Toileting assist Assist for toileting: Supervision/Verbal cueing     Transfers Chair/bed transfer  Transfers assist     Chair/bed transfer assist level: Supervision/Verbal cueing     Locomotion Ambulation   Ambulation assist      Assist level: Supervision/Verbal cueing Assistive device: Walker-rolling Max distance: 150'   Walk 10 feet activity   Assist     Assist level: Supervision/Verbal cueing Assistive device: Walker-rolling   Walk 50 feet activity   Assist    Assist level: Supervision/Verbal cueing Assistive device: Walker-rolling    Walk 150 feet activity   Assist    Assist level: Supervision/Verbal cueing Assistive device: Walker-rolling    Walk 10 feet on uneven surface  activity   Assist     Assist level: Contact Guard/Touching assist Assistive device: Walker-rolling   Wheelchair     Assist Is the patient using a wheelchair?: No             Wheelchair 50 feet with 2 turns activity    Assist            Wheelchair 150 feet activity     Assist          Blood pressure (!) 112/58, pulse (!) 50, temperature 98.1 F (36.7 C), temperature source Oral, resp. rate 16, height 5\' 2"  (1.575 m), weight 92.2 kg, SpO2 99%.  Medical Problem List and Plan: 1. 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  Antithrombotics: -DVT/anticoagulation:  Pharmaceutical: Lovenox              -antiplatelet therapy: DAPT X 3 weeks  followed by ASA alone.  3) Pain A) Right knee OA: knee sleeve ordered  Headaches/Pain Management: On Fioricet prm --hydrocodone  7.5/325 mg TID prn for LBP  Ford Ide NP) -pt has a lot of pain in her traps  and lower neck which generates headaches.             -continue cymbalta  and lyrica  as per home regimen             -add scheduled flexeril 5mg  at bedtime as well.              -kpad  4. Insomnia: ambien  scheduled 5mg  at night  5. Neuropsych/cognition: This patient is  capable of making decisions on her own behalf. 6. Skin/Wound Care:  Routine pressure relief measures.  7. Fluids/Electrolytes/Nutrition: Monitor I/O. Check CMET in am.  8. HTN: Monitor BP TID--on Cozaar  and amlodipine . Avoid hypoperfusion.  9. CKD IIIb: SCr baseline 1.4-1.5 range.  10. Dyslipidemia: LDL-136. Had leg pain with Lipitor-->Now  on crestor  trial  11. Fasting hyperglycemia: Hgb A1C-5.0  12. Class 2 obesity: BMI 37. Educate on diet and exercise to promote mobility and health.   13. Moderate OSA: CPAP resumed.   14. Fibromyalgia/LE spasms: Continue Cymbalta  and Lyrica      LOS: 1 days A FACE TO FACE EVALUATION WAS PERFORMED  Dakari Stabler P Devontae Casasola 08/24/2023, 11:20 AM

## 2023-08-24 NOTE — Progress Notes (Addendum)
 Inpatient Rehabilitation Care Coordinator Assessment and Plan Patient Details  Name: Amy Hooper MRN: [id number] Date of Birth: [birth year]  Today's Date: 08/24/2023  Hospital Problems: Principal Problem:   Acute ischemic left ACA stroke [facility])  Past Medical History:  Past Medical History:  Diagnosis Date   Allergy 1976   Tramadol  NSAIDS   Chronic kidney disease    Depression    Fibromyalgia    Gout    HTN (hypertension)    Hyperlipidemia 09/01/22   Insomnia    Morbid obesity (HCC)    Non-recurrent acute suppurative otitis media of left ear without spontaneous rupture of tympanic membrane 04/30/2022   Osteoarthritis    Palpitation    Sleep apnea    Past Surgical History:  Past Surgical History:  Procedure Laterality Date   APPLICATION OF WOUND VAC N/A 03/25/2021   Procedure: APPLICATION OF WOUND VAC;  Surgeon: Adah Acron, MD;  Location: [facility] OR;  Service: Orthopedics;  Laterality: N/A;   APPLICATION OF WOUND VAC N/A 03/30/2021   Procedure: WOUND VAC CHANGE 12x6x5;  Surgeon: Adah Acron, MD;  Location: [facility] OR;  Service: Orthopedics;  Laterality: N/A;   INCISION AND DRAINAGE OF WOUND N/A 03/25/2021   Procedure: LUMBAR POST OP INCISION IRRIGATION;  Surgeon: Adah Acron, MD;  Location: [facility] OR;  Service: Orthopedics;  Laterality: N/A;   JOINT REPLACEMENT     KNEE ARTHROSCOPY     LOOP RECORDER INSERTION N/A 08/23/2023   Procedure: LOOP RECORDER INSERTION;  Surgeon: Efraim Grange, MD;  Location: [facility] INVASIVE CV LAB;  Service: Cardiovascular;  Laterality: N/A;   LUMBAR WOUND DEBRIDEMENT N/A 03/30/2021   Procedure: REPEAT LUMBAR WOUND DEBRIDEMENT;  Surgeon: Adah Acron, MD;  Location: [facility] OR;  Service: Orthopedics;  Laterality: N/A;   right rotator cuff     ROTATOR CUFF REPAIR Left    TOTAL KNEE ARTHROPLASTY Left 09/08/2020   Procedure: LEFT TOTAL KNEE ARTHROPLASTY;  Surgeon: Wes Hamman, MD;  Location: [facility] OR;  Service: Orthopedics;  Laterality: Left;   TUBAL LIGATION      Social History:  reports that she has never smoked. She has been exposed to tobacco smoke. She has never used smokeless tobacco. She reports that she does not currently use alcohol. She reports that she does not use drugs.  Family / Support Systems Marital Status: Married Patient Roles: Spouse, Parent, Other (Comment) (employee) Spouse/Significant Other: Kriste Petite [phone] Other Supports: Sharon-sister [phone] Anticipated Caregiver: Husband to return from [place] and sister Ability/Limitations of Caregiver: husband will return from [place] to assist aware of DC date of 6/6 Caregiver Availability: 24/7 Family Dynamics: Close knit with husband and sister, pt was independent and worked part time @ [employer]. Very active and motivated to do well and recover  Social History Preferred language: English Religion: Non-Denominational Cultural Background: NA Education: Charity fundraiser - How often do you need to have someone help you when you read instructions, pamphlets, or other written material from your doctor or pharmacy?: Never Writes: Yes Employment Status: Employed Name of Employer: [employer] Return to Work Plans: Unsure if able to return will need to recover Legal History/Current Legal Issues: NA Guardian/Conservator: None-according to MD pt is capable of making her own decisions while here   Abuse/Neglect Abuse/Neglect Assessment Can Be Completed: Yes Physical Abuse: Denies Verbal Abuse: Denies Sexual Abuse: Denies Exploitation of patient/patient's resources: Denies Self-Neglect: Denies  Patient response to: Social Isolation - How often do you feel lonely or isolated from those around  you?: Rarely  Emotional Status Pt's affect, behavior and adjustment status: Pt is motivated to recover and get back to her life. She is motivated to do well and do what is needed. She worked part time and will go to OP Recent Psychosocial Issues: other health issues-stress prior to  CVA Psychiatric History: Hx-depression takes medications that help her Substance Abuse History: NA  Patient / Family Perceptions, Expectations & Goals Pt/Family understanding of illness & functional limitations: Pt is able to explain her stroke and is very knowlegable about her plan moving forward. She will be a short stay and actually is going home on Sat Premorbid pt/family roles/activities: wife, sibling, mom, etc Anticipated changes in roles/activities/participation: resume Pt/family expectations/goals: Pt states: " I plan to do well and get back to my life."  [employer]: None Premorbid Home Care/DME Agencies: None Transportation available at discharge: self and husband Is the patient able to respond to transportation needs?: Yes In the past 12 months, has lack of transportation kept you from medical appointments or from getting medications?: No In the past 12 months, has lack of transportation kept you from meetings, work, or from getting things needed for daily living?: No Resource referrals recommended: Neuropsychology  Discharge Planning Living Arrangements: Spouse/significant other Support Systems: Spouse/significant other, Other relatives, Friends/neighbors Type of Residence: Private residence Insurance Resources: Electrical engineer Resources: Employment, Restaurant manager, fast food Screen Referred: No Living Expenses: Lives with family Money Management: Patient, Spouse Does the patient have any problems obtaining your medications?: No Home Management: self Patient/Family Preliminary Plans: Return home with husband who is returning from [place] from helping his mom. Was evaluated today and goal mod/i and discharge 6/7. Care Coordinator Anticipated Follow Up Needs: HH/OP  Clinical Impression Pleasant female who is high level and going home on Sat. Husband coming back from [place] to assist and pt has her sister. Work on discharge Sat. Aware of team  conference update and goals  Maura Soulier Maralee Senate 08/24/2023, 1:51 PM

## 2023-08-24 NOTE — Progress Notes (Signed)
 Orthopedic Tech Progress Note Patient Details:  Amy Hooper 11/12/1950 [id number]  Ortho Devices Type of Ortho Device: Knee Sleeve Applied while in room with CRN       Kermitt Pedlar 08/24/2023, 9:19 AM

## 2023-08-24 NOTE — Plan of Care (Signed)
  Problem: RH Balance Goal: LTG Patient will maintain dynamic standing with ADLs (OT) Description: LTG:  Patient will maintain dynamic standing balance with assist during activities of daily living (OT)  Flowsheets (Taken 08/24/2023 1549) LTG: Pt will maintain dynamic standing balance during ADLs with: Independent   Problem: Sit to Stand Goal: LTG:  Patient will perform sit to stand in prep for activites of daily living with assistance level (OT) Description: LTG:  Patient will perform sit to stand in prep for activites of daily living with assistance level (OT) Flowsheets (Taken 08/24/2023 1549) LTG: PT will perform sit to stand in prep for activites of daily living with assistance level: Independent   Problem: RH Bathing Goal: LTG Patient will bathe all body parts with assist levels (OT) Description: LTG: Patient will bathe all body parts with assist levels (OT) Flowsheets (Taken 08/24/2023 1549) LTG: Pt will perform bathing with assistance level/cueing: Independent with assistive device  LTG: Position pt will perform bathing: Shower   Problem: RH Dressing Goal: LTG Patient will perform lower body dressing w/assist (OT) Description: LTG: Patient will perform lower body dressing with assist, with/without cues in positioning using equipment (OT) Flowsheets (Taken 08/24/2023 1549) LTG: Pt will perform lower body dressing with assistance level of: Independent with assistive device   Problem: RH Toileting Goal: LTG Patient will perform toileting task (3/3 steps) with assistance level (OT) Description: LTG: Patient will perform toileting task (3/3 steps) with assistance level (OT)  Flowsheets (Taken 08/24/2023 1549) LTG: Pt will perform toileting task (3/3 steps) with assistance level: Independent with assistive device   Problem: RH Toilet Transfers Goal: LTG Patient will perform toilet transfers w/assist (OT) Description: LTG: Patient will perform toilet transfers with assist, with/without cues  using equipment (OT) Flowsheets (Taken 08/24/2023 1549) LTG: Pt will perform toilet transfers with assistance level of: Independent with assistive device   Problem: RH Tub/Shower Transfers Goal: LTG Patient will perform tub/shower transfers w/assist (OT) Description: LTG: Patient will perform tub/shower transfers with assist, with/without cues using equipment (OT) Flowsheets (Taken 08/24/2023 1549) LTG: Pt will perform tub/shower stall transfers with assistance level of: Independent with assistive device

## 2023-08-24 NOTE — Progress Notes (Signed)
 Inpatient Rehabilitation  Patient information reviewed and entered into eRehab system by Feliberto Gottron, M.A., CCC-SLP, Rehab Quality Coordinator.  Information including medical coding, functional ability and quality indicators will be reviewed and updated through discharge.

## 2023-08-24 NOTE — Plan of Care (Signed)
  Problem: RH Balance Goal: LTG Patient will maintain dynamic standing balance (PT) Description: LTG:  Patient will maintain dynamic standing balance with assistance during mobility activities (PT) Flowsheets (Taken 08/24/2023 1231) LTG: Pt will maintain dynamic standing balance during mobility activities with:: Independent with assistive device    Problem: Sit to Stand Goal: LTG:  Patient will perform sit to stand with assistance level (PT) Description: LTG:  Patient will perform sit to stand with assistance level (PT) Flowsheets (Taken 08/24/2023 1231) LTG: PT will perform sit to stand in preparation for functional mobility with assistance level: Independent with assistive device   Problem: RH Bed Mobility Goal: LTG Patient will perform bed mobility with assist (PT) Description: LTG: Patient will perform bed mobility with assistance, with/without cues (PT). Flowsheets (Taken 08/24/2023 1231) LTG: Pt will perform bed mobility with assistance level of: Independent   Problem: RH Bed to Chair Transfers Goal: LTG Patient will perform bed/chair transfers w/assist (PT) Description: LTG: Patient will perform bed to chair transfers with assistance (PT). Flowsheets (Taken 08/24/2023 1231) LTG: Pt will perform Bed to Chair Transfers with assistance level: Independent with assistive device    Problem: RH Car Transfers Goal: LTG Patient will perform car transfers with assist (PT) Description: LTG: Patient will perform car transfers with assistance (PT). Flowsheets (Taken 08/24/2023 1231) LTG: Pt will perform car transfers with assist:: Independent with assistive device    Problem: RH Ambulation Goal: LTG Patient will ambulate in controlled environment (PT) Description: LTG: Patient will ambulate in a controlled environment, # of feet with assistance (PT). Flowsheets (Taken 08/24/2023 1231) LTG: Pt will ambulate in controlled environ  assist needed:: Independent with assistive device LTG: Ambulation  distance in controlled environment: 150' Goal: LTG Patient will ambulate in home environment (PT) Description: LTG: Patient will ambulate in home environment, # of feet with assistance (PT). Flowsheets (Taken 08/24/2023 1231) LTG: Pt will ambulate in home environ  assist needed:: Independent with assistive device LTG: Ambulation distance in home environment: 50'   Problem: RH Stairs Goal: LTG Patient will ambulate up and down stairs w/assist (PT) Description: LTG: Patient will ambulate up and down # of stairs with assistance (PT) Flowsheets (Taken 08/24/2023 1231) LTG: Pt will ambulate up/down stairs assist needed:: Independent with assistive device LTG: Pt will  ambulate up and down number of stairs: at least 8 steps with 2 hand rails or LRAD

## 2023-08-24 NOTE — Progress Notes (Signed)
 Physical Therapy Session Note  Patient Details  Name: Amy Hooper MRN: [id number] Date of Birth: 05-Dec-1950  Today's Date: 08/24/2023 PT Individual Time: 1430-1530 PT Individual Time Calculation (min): 60 min   Short Term Goals: Week 1:  PT Short Term Goal 1 (Week 1): STG = LTG due to ELOS  Skilled Therapeutic Interventions/Progress Updates:   Pt greeted sitting up in recliner - patient in agreement to therapy treatment. She reports upper neck/trap pain and a tension headache. Provided moist heat pack during session to this area and patient reports improvement in pain after heat. Messaged MD to obtain k-pad for her room to continue heat therapy for pain management.   Patient completes sit<>stand with supervision to RW. Administered with patient using the RW - pt ambulating a total distance of 578ft at supervision level while using RW. No seated or standing rest breaks needed during test. She was conversant throughout and had no LOB with head turns or distractions.   Completed BERG balance test with results listed below. Patient demonstrates decreased fall risk as noted by score of  54/56 on Berg Balance Scale.  (<36= high risk for falls, close to 100%; 37-45 significant >80%; 46-51 moderate >50%; 52-55 lower >25%). Patient with most difficulty doing alternating toe taps and single leg balance.   Patient completed lateral stepping L<>R in // bars with green TB around ankles to challenge hip strengthening and balance. Task upgraded to palm support, unilateral support, and then no UE support. Close supervision for safety while doing these tasks.   Patient provided a rollator to progress LRAD from RW - patient familiar with this since she has 1 at home. She ambulated back to he room at supervision level with improved gait speed and confidence. She used restroom without assist, continent of bladder, and left in bed with needs met.   Balance: Balance Balance Assessed:  Yes Standardized Balance Assessment Standardized Balance Assessment: (P) Berg Balance Test Berg Balance Test Sit to Stand: (P) Able to stand without using hands and stabilize independently Standing Unsupported: (P) Able to stand safely 2 minutes Sitting with Back Unsupported but Feet Supported on Floor or Stool: (P) Able to sit safely and securely 2 minutes Stand to Sit: (P) Sits safely with minimal use of hands Transfers: (P) Able to transfer safely, minor use of hands Standing Unsupported with Eyes Closed: (P) Able to stand 10 seconds safely Standing Ubsupported with Feet Together: (P) Able to place feet together independently and stand 1 minute safely From Standing, Reach Forward with Outstretched Arm: (P) Can reach confidently >25 cm (10") From Standing Position, Pick up Object from Floor: (P) Able to pick up shoe safely and easily From Standing Position, Turn to Look Behind Over each Shoulder: (P) Looks behind from both sides and weight shifts well Turn 360 Degrees: (P) Able to turn 360 degrees safely in 4 seconds or less Standing Unsupported, Alternately Place Feet on Step/Stool: (P) Able to stand independently and complete 8 steps >20 seconds Standing Unsupported, One Foot in Front: (P) Able to place foot tandem independently and hold 30 seconds Standing on One Leg: (P) Able to lift leg independently and hold 5-10 seconds Total Score: (P) 54 Dynamic Gait Index Level Surface: (P) Mild Impairment Static Sitting Balance Static Sitting - Balance Support: Feet supported;No upper extremity supported Static Sitting - Level of Assistance: 7: Independent Dynamic Sitting Balance Dynamic Sitting - Balance Support: During functional activity;No upper extremity supported Dynamic Sitting - Level of Assistance: 7: Independent Static  Standing Balance Static Standing - Balance Support: Right upper extremity supported;Left upper extremity supported;During functional activity Static Standing - Level  of Assistance: 5: Stand by assistance Dynamic Standing Balance Dynamic Standing - Balance Support: During functional activity;Right upper extremity supported;Left upper extremity supported Dynamic Standing - Level of Assistance: 5: Stand by assistance  Therapy Documentation Precautions:  Precautions Precautions: Fall Restrictions Weight Bearing Restrictions Per Provider Order: No General:      Therapy/Group: Individual Therapy  [name] 08/24/2023, 12:54 PM

## 2023-08-24 NOTE — Patient Care Conference (Signed)
 Inpatient RehabilitationTeam Conference and Plan of Care Update Date: 08/24/2023   Time: 11:17 AM    Patient Name: Amy Hooper      Medical Record Number: [id number]  Date of Birth: March 12, 1951 Sex: Female         Room/Bed: 4M04C/4M04C-01 Payor Info: Payor: MEDICARE / Plan: MEDICARE PART A AND B / Product Type: *No Product type* /    Admit Date/Time:  08/23/2023  3:14 PM  Primary Diagnosis:  Acute ischemic left ACA stroke [facility])  Hospital Problems: Principal Problem:   Acute ischemic left ACA stroke [facility])    Expected Discharge Date: Expected Discharge Date: 08/27/23  Team Members Present: Physician leading conference: Dr. Laverle Postin Social Worker Present: Adrianna Albee, LCSW Nurse Present: Forrestine Ike, RN PT Present: Oma Bias, PT OT Present: Celestino Cole, OT SLP Present: Dorla Gartner, SLP     Current Status/Progress Goal Weekly Team Focus  Bowel/Bladder   pt continent of b/b   Remain continent   Assist with toileting qshift and prn    Swallow/Nutrition/ Hydration               ADL's   contact guard / supervision   Mod I   Discharge planning, RUE strengthening/ coordination, balance- dynamic standing, endurance    Mobility   independent bed mobility, CGA stand pivot transfers with RW, CGA gait with RW, CGA for stairs   mod I  DC planning, higher level balance, endurance and activity tolerance    Communication                Safety/Cognition/ Behavioral Observations               Pain   c/o loop recorder site pain and headache, prn Norco given   <3 pain scale   Assess qshift and prn    Skin   Skin intact, L chest incision with steri strips and covered   Maintain skin integrity and promote healing  Assess qshift and prn      Discharge Planning:  New evaluation today-according to acute chart home with husband who is back from [place]   Team Discussion: Patient post left ACA CVA with balance issues, mild right lower extremity  weakness with multi co-morbidities.  Patient on target to meet rehab goals: yes, currently needs CGA - supervision for ADLs.  Needs CGA - supervision for mobility. Needs CGA without a RW to ambulate 150' and CGA for steps. Goals for discharge set for mod I overall.  *See Care Plan and progress notes for long and short-term goals.   Revisions to Treatment Plan:  N/a   Teaching Needs: Safety, medications, dietary modification, loop recorder care, transfers, toileting, etc.   Current Barriers to Discharge: Decreased caregiver support and Home enviroment access/layout  Possible Resolutions to Barriers: Family education OP follow up services     Medical Summary Current Status: hypotension, class 2 obesity, history of hypertension, insomnia, chronic pain syndrome  Barriers to Discharge: Medical stability  Barriers to Discharge Comments: hypotension, class 2 obesity, history of hypertension, insomnia, chronic pain syndrome Possible Resolutions to [employer] Focus: d/c amlodipine , provide dietary education, continue to monitor BP TID, continue cozaar , ambien  started, continue hydrocodone  and lyrica    Continued Need for Acute Rehabilitation Level of Care: The patient requires daily medical management by a physician with specialized training in physical medicine and rehabilitation for the following reasons: Direction of a multidisciplinary physical rehabilitation program to maximize functional independence : Yes Medical management of patient stability for  increased activity during participation in an intensive rehabilitation regime.: Yes Analysis of laboratory values and/or radiology reports with any subsequent need for medication adjustment and/or medical intervention. : Yes   I attest that I was present, lead the team conference, and concur with the assessment and plan of the team.   Forrestine Ike B 08/24/2023, 2:25 PM

## 2023-08-24 NOTE — Evaluation (Signed)
 Physical Therapy Assessment and Plan  Patient Details  Name: Courtany Mcmurphy MRN: [id number] Date of Birth: 01/25/1951  PT Diagnosis: Abnormality of gait, Difficulty walking, and Hemiplegia dominant Rehab Potential: Excellent ELOS: 3-4 days   Today's Date: 08/24/2023 PT Individual Time: 0800-0900 PT Individual Time Calculation (min): 60 min    Hospital Problem: Principal Problem:   Acute ischemic left ACA stroke [facility])   Past Medical History:  Past Medical History:  Diagnosis Date   Allergy 1976   Tramadol  NSAIDS   Chronic kidney disease    Depression    Fibromyalgia    Gout    HTN (hypertension)    Hyperlipidemia 09/01/22   Insomnia    Morbid obesity (HCC)    Non-recurrent acute suppurative otitis media of left ear without spontaneous rupture of tympanic membrane 04/30/2022   Osteoarthritis    Palpitation    Sleep apnea    Past Surgical History:  Past Surgical History:  Procedure Laterality Date   APPLICATION OF WOUND VAC N/A 03/25/2021   Procedure: APPLICATION OF WOUND VAC;  Surgeon: Adah Acron, MD;  Location: [facility] OR;  Service: Orthopedics;  Laterality: N/A;   APPLICATION OF WOUND VAC N/A 03/30/2021   Procedure: WOUND VAC CHANGE 12x6x5;  Surgeon: Adah Acron, MD;  Location: [facility] OR;  Service: Orthopedics;  Laterality: N/A;   INCISION AND DRAINAGE OF WOUND N/A 03/25/2021   Procedure: LUMBAR POST OP INCISION IRRIGATION;  Surgeon: Adah Acron, MD;  Location: [facility] OR;  Service: Orthopedics;  Laterality: N/A;   JOINT REPLACEMENT     KNEE ARTHROSCOPY     LOOP RECORDER INSERTION N/A 08/23/2023   Procedure: LOOP RECORDER INSERTION;  Surgeon: Efraim Grange, MD;  Location: [facility] INVASIVE CV LAB;  Service: Cardiovascular;  Laterality: N/A;   LUMBAR WOUND DEBRIDEMENT N/A 03/30/2021   Procedure: REPEAT LUMBAR WOUND DEBRIDEMENT;  Surgeon: Adah Acron, MD;  Location: [facility] OR;  Service: Orthopedics;  Laterality: N/A;   right rotator cuff     ROTATOR CUFF REPAIR Left    TOTAL KNEE  ARTHROPLASTY Left 09/08/2020   Procedure: LEFT TOTAL KNEE ARTHROPLASTY;  Surgeon: Wes Hamman, MD;  Location: [facility] OR;  Service: Orthopedics;  Laterality: Left;   TUBAL LIGATION      Assessment & Plan Clinical Impression: Patient is a 73 year old female with history of HTN, T2DM, OSA, CKD IIIb, lumbar surgery w/wound infection, Fibromyalgia who was admitted on 08/18/23 with reports of funny feeling "aura" progressive RLE weakness night prior to admission followed by RUE weakness on awakening. She has history of back surgery and issues with LE spasms. 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 Dr. Christiane Cowing felt that stroke with unclear etiology question cardiac and loop recorder placed today. Patient had stopped taking her ASA due to GERD but willing to resume. He recommended DAPT X 3 weeks followed by ASA alone.    2D echo showed EF 65-70% with no wall abnormality.  BLE dopplers were negative for DVT. PT/OT consulted and patient requiring CGA for mobility and ADLs. She was independent PTA. CIR recommended due to functional decline.   Patient transferred to CIR on 08/23/2023 .   Patient currently requires supervision with mobility secondary to muscle weakness and muscle joint tightness, decreased cardiorespiratoy endurance, and decreased standing balance, hemiplegia, and  decreased balance strategies.  Prior to hospitalization, patient was independent  with mobility and lived with Spouse, Family in a House home.  Home access is 7Stairs to enter.  Patient will benefit from skilled PT intervention to maximize safe functional mobility, minimize fall risk, and decrease caregiver burden for planned discharge home with intermittent assist.  Anticipate patient will benefit from follow up OP at  discharge.  PT - End of Session Activity Tolerance: Tolerates 10 - 20 min activity with multiple rests Endurance Deficit: Yes PT Assessment Rehab Potential (ACUTE/IP ONLY): Excellent PT Barriers to Discharge: Decreased caregiver support;Insurance for SNF coverage PT Patient demonstrates impairments in the following area(s): Balance;Endurance;Motor;Safety;Pain PT Transfers Functional Problem(s): Bed Mobility;Bed to Chair;Car PT Locomotion Functional Problem(s): Ambulation;Stairs PT Plan PT Intensity: Minimum of 1-2 x/day ,45 to 90 minutes PT Frequency: 5 out of 7 days PT Duration Estimated Length of Stay: 3-4 days PT Treatment/Interventions: Ambulation/gait training;Balance/vestibular training;Community reintegration;Discharge planning;DME/adaptive equipment instruction;Disease management/prevention;Neuromuscular re-education;Functional mobility training;Pain management;Psychosocial support;Patient/family education;Stair training;Therapeutic Activities;Therapeutic Exercise;UE/LE Strength taining/ROM;UE/LE Coordination activities PT Transfers Anticipated Outcome(s): mod i PT Locomotion Anticipated Outcome(s): mod I PT Recommendation Recommendations for Other Services: None Follow Up Recommendations: Outpatient PT Patient destination: Home Equipment Recommended: To be determined   PT Evaluation Precautions/Restrictions Precautions Precautions: Fall Restrictions Weight Bearing Restrictions Per Provider Order: No Pain Pain Assessment Pain Scale: 0-10 Pain Score: 8  Pain Type: Acute pain Pain Location: Chest Pain Intervention(s): Medication (See eMAR) Pain Interference Pain Interference Pain Effect on Sleep: 2. Occasionally Pain Interference with Therapy Activities: 2. Occasionally Pain Interference with Day-to-Day Activities: 2. Occasionally Home Living/Prior Functioning Home Living Available Help at Discharge: Family;Available 24 hours/day Type of Home: House Home Access:  Stairs to enter [employer] of Steps: 7 Entrance Stairs-Rails: Right;Left Home Layout: One level Bathroom Shower/Tub: Tub/shower unit;Walk-in shower;Door [employer] Toilet: Standard Bathroom Accessibility: Yes Additional Comments: spouse in [place] settling his Mom in at home from  SNF. Gone for 2 weeks. 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 [place] Vision/Perception  Vision - History Ability to See in Adequate Light: 0 Adequate Vision - Assessment Additional Comments: Due for Cataracts follow up with Optho June 2nd Perception Perception: Within Functional Limits Praxis Praxis: WFL  Cognition Overall Cognitive Status: Within Functional Limits for tasks assessed Arousal/Alertness: Awake/alert Memory: Appears intact Awareness: Appears intact Problem Solving: Appears intact Behaviors:  (Very chatty during eval) Safety/Judgment: Appears intact Sensation Sensation Light Touch: Appears Intact Hot/Cold: Appears Intact Proprioception: Appears Intact Stereognosis: Not tested Motor  Motor Motor: Hemiplegia Motor - Skilled Clinical Observations: mild on R   Trunk/Postural Assessment  Cervical Assessment Cervical Assessment: Within Functional Limits (stiffness in neck upper shoulders) Thoracic Assessment Thoracic Assessment: Within Functional Limits Lumbar Assessment Lumbar Assessment: Within Functional Limits Postural Control Postural Control: Within Functional Limits  Balance Balance Balance Assessed: Yes Static Sitting Balance Static Sitting - Level of Assistance: 7: Independent Dynamic Sitting Balance Dynamic Sitting - Balance Support: During functional activity;No upper extremity  supported Dynamic Sitting - Level of Assistance: 7: Independent Static Standing Balance Static Standing - Balance Support: Right upper extremity supported;Left upper extremity supported;During functional activity Static Standing - Level of Assistance: 5: Stand by assistance Dynamic Standing Balance Dynamic Standing - Balance Support: During functional activity;Right upper extremity supported;Left upper extremity supported Dynamic Standing -  Level of Assistance: 5: Stand by assistance Extremity Assessment      RLE Assessment RLE Assessment: Exceptions to [facility] General Strength Comments: Grossly 4+/5 LLE Assessment LLE Assessment: Within Functional Limits  Care Tool Care Tool Bed Mobility Roll left and right activity   Roll left and right assist level: Supervision/Verbal cueing    Sit to lying activity   Sit to lying assist level: Independent with assistive device    Lying to sitting on side of bed activity   Lying to sitting on side of bed assist level: the ability to move from lying on the back to sitting on the side of the bed with no back support.: Independent with assistive device     Care Tool Transfers Sit to stand transfer   Sit to stand assist level: Supervision/Verbal cueing    Chair/bed transfer   Chair/bed transfer assist level: Supervision/Verbal cueing    Car transfer   Car transfer assist level: Supervision/Verbal cueing      Care Tool Locomotion Ambulation   Assist level: Supervision/Verbal cueing Assistive device: Walker-rolling Max distance: 150'  Walk 10 feet activity   Assist level: Supervision/Verbal cueing Assistive device: Walker-rolling   Walk 50 feet with 2 turns activity   Assist level: Supervision/Verbal cueing Assistive device: Walker-rolling  Walk 150 feet activity   Assist level: Supervision/Verbal cueing Assistive device: Walker-rolling  Walk 10 feet on uneven surfaces activity   Assist level: Contact Guard/Touching assist Assistive  device: Walker-rolling  Stairs   Assist level: Contact Guard/Touching assist Stairs assistive device: 2 hand rails Max number of stairs: 12  Walk up/down 1 step activity   Walk up/down 1 step (curb) assist level: Contact Guard/Touching assist Walk up/down 1 step or curb assistive device: 2 hand rails  Walk up/down 4 steps activity   Walk up/down 4 steps assist level: Contact Guard/Touching assist Walk up/down 4 steps assistive device: 2 hand rails  Walk up/down 12 steps activity   Walk up/down 12 steps assist level: Contact Guard/Touching assist Walk up/down 12 steps assistive device: 2 hand rails  Pick up small objects from floor   Pick up small object from the floor assist level: Contact Guard/Touching assist    Wheelchair Is the patient using a wheelchair?: No          Wheel 50 feet with 2 turns activity      Wheel 150 feet activity        Refer to Care Plan for Long Term Goals  SHORT TERM GOAL WEEK 1 PT Short Term Goal 1 (Week 1): STG = LTG due to ELOS  Recommendations for other services: None   Skilled Therapeutic Intervention Mobility Bed Mobility Bed Mobility: Sit to Supine;Supine to Sit Supine to Sit: Independent with assistive device Sit to Supine: Independent with assistive device Transfers Transfers: Sit to Stand;Stand Pivot Transfers;Stand to Sit Sit to Stand: Supervision/Verbal cueing Stand to Sit: Supervision/Verbal cueing Stand Pivot Transfers: Supervision/Verbal cueing Stand Pivot Transfer Details: Verbal cues for technique;Verbal cues for gait pattern;Verbal cues for precautions/safety Transfer (Assistive device): Rolling walker Locomotion  Gait Ambulation: Yes Gait Assistance: Supervision/Verbal cueing Gait Distance (Feet): 200 Feet Assistive device: Rolling walker Gait Assistance Details: Verbal cues for precautions/safety;Verbal cues for technique;Verbal cues for gait pattern;Verbal cues for safe use of DME/AE Gait Gait: Yes Gait Pattern:  Within Functional Limits Gait Pattern: Step-through pattern;Decreased stance time - right;Decreased hip/knee flexion - right;Decreased dorsiflexion - right;Trunk flexed Stairs / Additional Locomotion Stairs: Yes Stairs Assistance: Contact Guard/Touching assist Stair Management Technique:  Two rails;Alternating pattern;Forwards Number of Stairs: 12 Height of Stairs: 6 Wheelchair Mobility Wheelchair Mobility: No  Treatment: Patient in bed to start - agreeable to PT evaluation. Pt reporting mild to moderate pain at her loop recorder side as well as her R foot. Mobility and repositioning provided for pain management. Pain did not affect her therapy or mobility. Functional mobility completed as outlined above - overall requires CGA to supervision for functional mobility with the use of a RW. Only mild R sided weakness present but no true knee buckling or LOB occurred during upright  mobility. Cautious gait pattern with decreased speed, patient feeling more comfortable with RW vs without. Patient complaining of R knee pain related to her chronic OA - MD to order knee sleeve for management. Patient ended session in room with her needs met.   Discharge Criteria: Patient will be discharged from PT if patient refuses treatment 3 consecutive times without medical reason, if treatment goals not met, if there is a change in medical status, if patient makes no progress towards goals or if patient is discharged from hospital.  The above assessment, treatment plan, treatment alternatives and goals were discussed and mutually agreed upon: by patient  [name] 08/24/2023, 10:30 AM

## 2023-08-25 ENCOUNTER — Ambulatory Visit: Admitting: Registered Nurse

## 2023-08-25 DIAGNOSIS — I63522 Cerebral infarction due to unspecified occlusion or stenosis of left anterior cerebral artery: Secondary | ICD-10-CM | POA: Diagnosis not present

## 2023-08-25 LAB — CBC
HCT: 33.8 % — ABNORMAL LOW (ref 36.0–46.0)
Hemoglobin: 11.4 g/dL — ABNORMAL LOW (ref 12.0–15.0)
MCH: 29.5 pg (ref 26.0–34.0)
MCHC: 33.7 g/dL (ref 30.0–36.0)
MCV: 87.3 fL (ref 80.0–100.0)
Platelets: 295 10*3/uL (ref 150–400)
RBC: 3.87 MIL/uL (ref 3.87–5.11)
RDW: 13.7 % (ref 11.5–15.5)
WBC: 5.9 10*3/uL (ref 4.0–10.5)
nRBC: 0 % (ref 0.0–0.2)

## 2023-08-25 LAB — VITAMIN D 25 HYDROXY (VIT D DEFICIENCY, FRACTURES): Vit D, 25-Hydroxy: 80.15 ng/mL (ref 30–100)

## 2023-08-25 LAB — GLUCOSE, CAPILLARY: Glucose-Capillary: 75 mg/dL (ref 70–99)

## 2023-08-25 LAB — BASIC METABOLIC PANEL WITH GFR
Anion gap: 9 (ref 5–15)
BUN: 23 mg/dL (ref 8–23)
CO2: 24 mmol/L (ref 22–32)
Calcium: 9.3 mg/dL (ref 8.9–10.3)
Chloride: 107 mmol/L (ref 98–111)
Creatinine, Ser: 1.27 mg/dL — ABNORMAL HIGH (ref 0.44–1.00)
GFR, Estimated: 45 mL/min — ABNORMAL LOW (ref >60)
Glucose, Bld: 90 mg/dL (ref 70–99)
Potassium: 4.5 mmol/L (ref 3.5–5.1)
Sodium: 140 mmol/L (ref 135–145)

## 2023-08-25 MED ORDER — CLOPIDOGREL BISULFATE 75 MG PO TABS
75.0000 mg | ORAL_TABLET | Freq: Every day | ORAL | 0 refills | Status: DC
  Filled 2023-08-25: qty 13, 13d supply, fill #0

## 2023-08-25 MED ORDER — CYCLOBENZAPRINE HCL 5 MG PO TABS
5.0000 mg | ORAL_TABLET | Freq: Every day | ORAL | 0 refills | Status: AC
  Filled 2023-08-25: qty 30, 30d supply, fill #0

## 2023-08-25 MED ORDER — SENNOSIDES-DOCUSATE SODIUM 8.6-50 MG PO TABS: 2.0000 | ORAL_TABLET | Freq: Two times a day (BID) | ORAL | 0 refills | Status: AC

## 2023-08-25 MED ORDER — AMLODIPINE BESYLATE 2.5 MG PO TABS
2.5000 mg | ORAL_TABLET | Freq: Every day | ORAL | Status: DC
  Administered 2023-08-25 – 2023-08-26 (×2): 2.5 mg via ORAL
  Filled 2023-08-25 (×2): qty 1

## 2023-08-25 NOTE — Plan of Care (Signed)
 Assumed care at 1900. Pt has been resting comfortably in bed overnight. Pt attempted to wear boot overnight but caused discomfort. See MAR. No significant events overnight.    Problem: Consults Goal: RH STROKE PATIENT EDUCATION Description: See Patient Education module for education specifics  Outcome: Progressing   Problem: RH SAFETY Goal: RH STG ADHERE TO SAFETY PRECAUTIONS W/ASSISTANCE/DEVICE Description: STG Adhere to Safety Precautions With cues Assistance/Device. Outcome: Progressing   Problem: RH PAIN MANAGEMENT Goal: RH STG PAIN MANAGED AT OR BELOW PT'S PAIN GOAL Description: Pain  4 with prns Outcome: Progressing   Problem: RH KNOWLEDGE DEFICIT Goal: RH STG INCREASE KNOWLEDGE OF DIABETES Description: Patient and spouse will be able to manage DM using educational resources for medication and dietary modification independently Outcome: Progressing Goal: RH STG INCREASE KNOWLEDGE OF HYPERTENSION Description: Patient and spouse will be able to manage HTN using educational resources for medication and dietary modification independently Outcome: Progressing Goal: RH STG INCREASE KNOWLEGDE OF HYPERLIPIDEMIA Description: Patient and spouse will be able to manage HLD using educational resources for medication and dietary modification independently Outcome: Progressing Goal: RH STG INCREASE KNOWLEDGE OF STROKE PROPHYLAXIS Description: Patient and spouse will be able to manage secondary risks using educational resources for medication and dietary modification independently Outcome: Progressing

## 2023-08-25 NOTE — Progress Notes (Signed)
 PROGRESS NOTE   Subjective/Complaints: Slept well with ambien  Cr improved Hgb improved Heat helped with neck pain  ROS: right knee OA pain, +neck pain   Objective:   No results found.  Recent Labs    08/24/23 0505 08/25/23 0527  WBC 6.2 5.9  HGB 11.0* 11.4*  HCT 32.2* 33.8*  PLT 272 295   Recent Labs    08/24/23 0505 08/25/23 0527  NA 139 140  K 4.6 4.5  CL 105 107  CO2 25 24  GLUCOSE 85 90  BUN 25* 23  CREATININE 1.37* 1.27*  CALCIUM  9.1 9.3    Intake/Output Summary (Last 24 hours) at 08/25/2023 1050 Last data filed at 08/24/2023 1908 Gross per 24 hour  Intake 357 ml  Output --  Net 357 ml        Physical Exam: Vital Signs Blood pressure (!) 151/79, pulse 71, temperature 98.2 F (36.8 C), temperature source Oral, resp. rate 16, height 5\' 2"  (1.575 m), weight 92.2 kg, SpO2 97%. Gen: no distress, normal appearing HEENT: oral mucosa pink and moist, NCAT Cardiovascular:     Rate and Rhythm: Regular rhythm. Bradycardia present.     Heart sounds: No murmur heard.    No gallop.  Pulmonary:     Effort: Pulmonary effort is normal. No respiratory distress.     Breath sounds: No wheezing or rales.  Abdominal:     General: Bowel sounds are normal. There is no distension.     Tenderness: There is no abdominal tenderness.  Musculoskeletal:        General: Tenderness (mid to upper traps, cervical paraspinals bilaterally.) present. No swelling.     Comments: Upper and mid traps are tight and limit cervical rom to a degree.I did not appreciate any focal knots or bands per se.   Skin:    General: Skin is warm and dry.     Comments: Old L TKA scar  Neurological:     Mental Status: She is alert.     Comments: Alert and oriented x 3. Normal insight and awareness. Intact Memory. Normal language and speech. Cranial nerve exam unremarkable for ? Partial vision loss in lower right quadrant (vs right inattention).   Pt with decreased sensation along sole of right foot (chronic). Did not appreciate sensory loss elsewhere. DTR's 1+, toes down.    MMT:  RUE 4/5 LUE 5/5 RLE: 3/5 proximally and 2/5 distally Psychiatric:        Mood and Affect: Mood normal.        Behavior: Behavior normal.     Assessment/Plan: 1. Functional deficits which require 3+ hours per day of interdisciplinary therapy in a comprehensive inpatient rehab setting. Physiatrist is providing close team supervision and 24 hour management of active medical problems listed below. Physiatrist and rehab team continue to assess barriers to discharge/monitor patient progress toward functional and medical goals  Care Tool:  Bathing    Body parts bathed by patient: Right arm, Left arm, Chest, Abdomen, Front perineal area, Buttocks, Right upper leg, Left upper leg, Right lower leg, Left lower leg, Face         Bathing assist Assist Level: Contact Guard/Touching assist  Upper Body Dressing/Undressing Upper body dressing   What is the patient wearing?: Bra, Pull over shirt    Upper body assist Assist Level: Set up assist    Lower Body Dressing/Undressing Lower body dressing      What is the patient wearing?: Underwear/pull up, Pants     Lower body assist Assist for lower body dressing: Contact Guard/Touching assist     Toileting Toileting    Toileting assist Assist for toileting: Contact Guard/Touching assist     Transfers Chair/bed transfer  Transfers assist     Chair/bed transfer assist level: Supervision/Verbal cueing     Locomotion Ambulation   Ambulation assist      Assist level: Supervision/Verbal cueing Assistive device: Walker-rolling Max distance: 150'   Walk 10 feet activity   Assist     Assist level: Supervision/Verbal cueing Assistive device: Walker-rolling   Walk 50 feet activity   Assist    Assist level: Supervision/Verbal cueing Assistive device: Walker-rolling    Walk 150  feet activity   Assist    Assist level: Supervision/Verbal cueing Assistive device: Walker-rolling    Walk 10 feet on uneven surface  activity   Assist     Assist level: Contact Guard/Touching assist Assistive device: Walker-rolling   Wheelchair     Assist Is the patient using a wheelchair?: No             Wheelchair 50 feet with 2 turns activity    Assist            Wheelchair 150 feet activity     Assist          Blood pressure (!) 151/79, pulse 71, temperature 98.2 F (36.8 C), temperature source Oral, resp. rate 16, height 5\' 2"  (1.575 m), weight 92.2 kg, SpO2 97%.  Medical Problem List and Plan: 1. 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  Antithrombotics: -DVT/anticoagulation:  Pharmaceutical: Lovenox              -antiplatelet therapy: continue DAPT X 3 weeks followed by ASA alone.  3) Pain A) Right knee OA: knee sleeve ordered  B) Neck pain: kpad ordered Headaches/Pain Management: On Fioricet prm --hydrocodone  7.5/325 mg TID prn for LBP  Ford Ide NP) -pt has a lot of pain in her traps and lower neck which generates headaches.             -continue cymbalta  and lyrica  as per home regimen             -add scheduled flexeril 5mg  at bedtime as well.              -kpad  4. Insomnia: ambien  scheduled 5mg  at night  5. Neuropsych/cognition: This patient is  capable of making decisions on her own behalf. 6. Skin/Wound Care:  Routine pressure relief measures.  7. Fluids/Electrolytes/Nutrition: Monitor I/O. Check CMET in am.   A) screening for vitamin D  deficiency: add on vitamin D  level  8. HTN: Monitor BP TID--on Cozaar  Start amlodipine  2.5mg  daily   9. CKD IIIb: SCr baseline 1.4-1.5 range.   6/5 Cr reviewed and has improved to 1.27  10. Dyslipidemia: LDL-136. Had leg pain with  Lipitor-->Now  on crestor  trial  11. Fasting hyperglycemia: Hgb A1C-5.0  12. Class 2  obesity: BMI 37. Educate on diet and exercise to promote mobility and health.   13. Moderate OSA: CPAP resumed.   14. Fibromyalgia/LE spasms: Continue Cymbalta  and Lyrica , discussed outpatient aquatherapy    LOS: 2 days A FACE TO FACE EVALUATION WAS PERFORMED  Fallou Hulbert P Chetara Kropp 08/25/2023, 10:50 AM

## 2023-08-25 NOTE — Group Note (Signed)
 Patient Details Name: Cecylia Brazill MRN: [id number] DOB: [birth year] Today's Date: 08/25/2023  Time Calculation: OT Group Time Calculation OT Group Start Time: 1430 OT Group Stop Time: 1530 OT Group Time Calculation (min): 60 min      Group Description: Dance Group: Pt participated in dance group with an emphasis on social interaction, motor planning, increasing overall activity tolerance and bimanual tasks. All songs were selected by group members. Dance moves included AROM of BUE/BLE gross motor movements with an emphasis on building functional endurance.    Individual level documentation: Patient completed group from sitting level. Patientt needed supervision to complete various dance moves with OT providing visual model.  Patient needed able to create her own modifications during group.  Pain:  0/10  Precautions:  Falls   Geoffery Kiel 08/25/2023, 3:53 PM

## 2023-08-25 NOTE — Progress Notes (Signed)
 Patient ID: Amy Hooper, female   DOB: 10/27/1950, 73 y.o.   MRN: [id number] Met with the patient to review current medical situation, rehab process, team conference and plan of care. Discussed secondary risk management including HTN, HLD on Crestor , medications and dietary modification recommendations. Reviewed pain in right knee "crunching"; MD ordered sleeve. Stabbing pain in right heel; PRAFO boot requested for HS.  Insomnia addressed per MD and constipation addressed. Small amount of drainage on loop recorder dressing; reviewed phone app for loop along with DAPT x 3 weeks then ASA solo. Continue to follow along to address educational needs to facilitate preparation for discharge. Naoma Bacca

## 2023-08-25 NOTE — Progress Notes (Signed)
   08/25/23 2036  BiPAP/CPAP/SIPAP  Reason BIPAP/CPAP not in use Non-compliant (Pt states she will only be here tonight. No need to wear for one night. Pt understands to contact RT if she changes her mind)

## 2023-08-25 NOTE — Discharge Instructions (Addendum)
 Inpatient Rehab Discharge Instructions  Amy Hooper Discharge date and time:    Activities/Precautions/ Functional Status: Activity: no lifting, driving, or strenuous exercise for till cleared by MD Diet: cardiac diet Wound Care: none needed   Functional status:  ___ No restrictions     ___ Walk up steps independently ___ 24/7 supervision/assistance   ___ Walk up steps with assistance ___ Intermittent supervision/assistance  ___ Bathe/dress independently ___ Walk with walker     ___ Bathe/dress with assistance ___ Walk Independently    ___ Shower independently ___ Walk with assistance    ___ Shower with assistance ___ No alcohol     ___ Return to work/school ________  Special Instructions:  STROKE/TIA DISCHARGE INSTRUCTIONS SMOKING Cigarette smoking nearly doubles your risk of having a stroke & is the single most alterable risk factor  If you smoke or have smoked in the last 12 months, you are advised to quit smoking for your health. Most of the excess cardiovascular risk related to smoking disappears within a year of stopping. Ask you doctor about anti-smoking medications [place] Quit Line: 1-800-QUIT NOW Free Smoking Cessation Classes [phone]  CHOLESTEROL Know your levels; limit fat & cholesterol in your diet  Lipid Panel     Component Value Date/Time   CHOL 226 (H) 08/18/2023 2251   CHOL 239 (H) 04/25/2023 0926   TRIG 62 08/18/2023 2251   HDL 78 08/18/2023 2251   HDL 71 04/25/2023 0926   CHOLHDL 2.9 08/18/2023 2251   VLDL 12 08/18/2023 2251   LDLCALC 136 (H) 08/18/2023 2251   LDLCALC 155 (H) 04/25/2023 0926   LDLCALC 145 (H) 01/08/2020 0826     Many patients benefit from treatment even if their cholesterol is at goal. Goal: Total Cholesterol (CHOL) less than 160 Goal:  Triglycerides (TRIG) less than 150 Goal:  HDL greater than 40 Goal:  LDL (LDLCALC) less than 100   BLOOD PRESSURE American Stroke Association blood pressure target is less  that 120/80 mm/Hg  Your discharge blood pressure is:  BP: 133/60 Monitor your blood pressure Limit your salt and alcohol intake Many individuals will require more than one medication for high blood pressure  DIABETES (A1c is a blood sugar average for last 3 months) Goal HGBA1c is under 7% (HBGA1c is blood sugar average for last 3 months)  Diabetes: No known diagnosis of diabetes    Lab Results  Component Value Date   HGBA1C 5.0 08/18/2023    Your HGBA1c can be lowered with medications, healthy diet, and exercise. Check your blood sugar as directed by your physician Call your physician if you experience unexplained or low blood sugars.  PHYSICAL ACTIVITY/REHABILITATION Goal is 30 minutes at least 4 days per week  Activity: No driving, Therapies: see above Return to work: N/A Activity decreases your risk of heart attack and stroke and makes your heart stronger.  It helps control your weight and blood pressure; helps you relax and can improve your mood. Participate in a regular exercise program. Talk with your doctor about the best form of exercise for you (dancing, walking, swimming, cycling).  DIET/WEIGHT Goal is to maintain a healthy weight  Your discharge diet is:  Diet Order             Diet Heart Fluid consistency: Thin  Diet effective now                   liquids Your height is:  Height: 5'  2" (157.5 cm) Your current weight is: Weight: 92.2 kg Your Body Mass Index (BMI) is:  BMI (Calculated): 37.17 Following the type of diet specifically designed for you will help prevent another stroke. Your goal weight range is:   Your goal Body Mass Index (BMI) is 19-24. Healthy food habits can help reduce 3 risk factors for stroke:  High cholesterol, hypertension, and excess weight.  RESOURCES Stroke/Support Group:  Call [phone]   STROKE EDUCATION PROVIDED/REVIEWED AND GIVEN TO PATIENT Stroke warning signs and symptoms How to activate emergency medical system (call  [phone]). Medications prescribed at discharge. Need for follow-up after discharge. Personal risk factors for stroke. Pneumonia vaccine given:  Flu vaccine given:  My questions have been answered, the writing is legible, and I understand these instructions.  I will adhere to these goals & educational materials that have been provided to me after my discharge from the hospital.     My questions have been answered and I understand these instructions. I will adhere to these goals and the provided educational materials after my discharge from the hospital.  Patient/Caregiver Signature _______________________________ Date __________  Clinician Signature _______________________________________ Date __________  Please bring this form and your medication list with you to all your follow-up doctor's appointments. COMMUNITY REFERRALS UPON DISCHARGE:    Outpatient: PT    &     OT             Agency:[facility] [address] [place] [address] Phone:[phone]              Appointment Date/Time:WILL CALL TO SET UP FOLLOW UP APPOINTMENTS  Medical Equipment/Items Ordered:NA                                                 Agency/Supplier:NA

## 2023-08-25 NOTE — Progress Notes (Signed)
 Physical Therapy Session Note  Patient Details  Name: Amy Hooper MRN: [id number] Date of Birth: 21-Apr-1950  {CHL IP REHAB PT TIME CALCULATION:[id number]}  Short Term Goals: Week 1:  PT Short Term Goal 1 (Week 1): STG = LTG due to ELOS  Skilled Therapeutic Interventions/Progress Updates:    Patient seated in recliner on entrance to room. Patient alert and agreeable to PT session.   Patient reported that last night she had discomfort with her boot while sleeping. Pt. Also reports that she felt great after her last PT session. Pt. Had concerns about her BP reading from last night, the PT used a larger sized cuff, BP was 137/65 to start session.   Therapeutic Activity:  Gait Training:  Pt ambulated 157ft w/o AD with CGA. Pt demonstrated the following gait deviations with therapist providing the described cuing and facilitation for improvement: decreased swing clearance, narrow step width, decreased arm swing, increased reliance on vision (looking down)    Neuromuscular Re-ed: -Pt. Performed ladder stepping, and progressed to ladder stepping over 3 inch objects to work on step clearance. Pt. Reported that she felt challenged and caught her foot several times on the ladder. Pt. -Performed backwards walking in incline ramp 3x -Pt. Performed uneven terrain side stepping and marching. Pt. Performed all exercises with CGA.   NMR performed for improvements in motor control and coordination, balance, sequencing, judgement, and self confidence/ efficacy in performing all aspects of mobility at highest level of independence.   Therapeutic Exercise: Pt performed the following exercises with therapist providing the described cuing and facilitation for improvement. - Resisted side stepping in parallel bars in mini squat position  - Seated ball reaches with spinal rotations -Standing trunk rotations with a physio ball hug  Pt. Performed all dynamic exercises with CGA and static exercises  with supervision.   Patient was left in recliner at end  of session, with call bell and all needs within reach.       Therapy Documentation Precautions:  Precautions Precautions: Fall Precaution/Restrictions Comments: loop recorder Restrictions Weight Bearing Restrictions Per Provider Order: No      Therapy/Group: Individual Therapy  Leith Szafranski 08/25/2023, 7:48 AM

## 2023-08-25 NOTE — Progress Notes (Signed)
 Occupational Therapy Session Note  Patient Details  Name: Amy Hooper MRN: [id number] Date of Birth: March 10, 1951  Today's Date: 08/25/2023 OT Individual Time: 0947-1100 OT Individual Time Calculation (min): 73 min    Short Term Goals: Week 1:  OT Short Term Goal 1 (Week 1): STG=LTG due to LOS - Overall goal of Modified I with BADL  Skilled Therapeutic Interventions/Progress Updates:   Patient received seated in recliner, eager to get cleaned up and dressed. Patient should be able to shower tomorrow.  Patient able to dress herself without assistance.  Patient walked to gym with cueing and facilitation for postural control to remain active and upright - patient has weaker R hip/ RLE.  Worked first on gross motor coordination also challenging balance and endurance.  Patient able to walk and toss ball, change directions, and complete sudden stops without loss of balance.  Worked on RUE range of motion with ball up the wall exercise.  Also worked on gentle range of motion throughout trunk/hips by seated physioball exercise - slow stretch.   Finished this session with fine motor coordination exercises addressing pinch, grasp and in hand manipulation. Patient walked back to room and was left seated in recliner with personal items in reach and call bell at her side.    Therapy Documentation Precautions:  Precautions Precautions: Fall Precaution/Restrictions Comments: loop recorder Restrictions Weight Bearing Restrictions Per Provider Order: No   Pain: Pain Assessment Pain Scale: 0-10 Pain Score: 6 Pain Location: low back Pain Intervention(s): Called for meds    Therapy/Group: Individual Therapy  Osric Klopf M 08/25/2023, 12:48 PM

## 2023-08-26 ENCOUNTER — Other Ambulatory Visit (HOSPITAL_COMMUNITY): Payer: Self-pay

## 2023-08-26 ENCOUNTER — Encounter: Admitting: Registered Nurse

## 2023-08-26 ENCOUNTER — Other Ambulatory Visit: Payer: Self-pay | Admitting: Physical Medicine and Rehabilitation

## 2023-08-26 ENCOUNTER — Encounter (HOSPITAL_COMMUNITY): Payer: Self-pay | Admitting: Physical Medicine and Rehabilitation

## 2023-08-26 DIAGNOSIS — I63522 Cerebral infarction due to unspecified occlusion or stenosis of left anterior cerebral artery: Secondary | ICD-10-CM | POA: Diagnosis not present

## 2023-08-26 DIAGNOSIS — I639 Cerebral infarction, unspecified: Secondary | ICD-10-CM

## 2023-08-26 MED ORDER — CYCLOBENZAPRINE HCL 5 MG PO TABS
5.0000 mg | ORAL_TABLET | Freq: Once | ORAL | Status: AC
  Administered 2023-08-26: 5 mg via ORAL
  Filled 2023-08-26: qty 1

## 2023-08-26 MED ORDER — PROPRANOLOL HCL 10 MG PO TABS
10.0000 mg | ORAL_TABLET | Freq: Every day | ORAL | 0 refills | Status: DC
  Filled 2023-08-26: qty 30, 30d supply, fill #0

## 2023-08-26 MED ORDER — PROPRANOLOL HCL 10 MG PO TABS
10.0000 mg | ORAL_TABLET | Freq: Every day | ORAL | Status: DC
  Administered 2023-08-26 – 2023-08-27 (×2): 10 mg via ORAL
  Filled 2023-08-26 (×2): qty 1

## 2023-08-26 NOTE — Progress Notes (Signed)
 Occupational Therapy Discharge Summary  Patient Details  Name: Amy Hooper MRN: [id number] Date of Birth: 12-26-1950  Date of Discharge from OT service:August 26, 2023  Today's Date: 08/26/2023 OT Individual Time: 1115-1200 OT Individual Time Calculation (min): 45 min   Skilled Therapeutic Intervention:  Patient received as direct handoff from PT.  Patient agreeable to OT and indicates she needs to use the bathroom.  Assisted patient off the NuStep, and walked her to her room to use the bathroom.  Patient walking initially with rollator as her legs were tired after PT.  Patient used the bathroom then walked to gym to review plan for aquatics therapy after discharge.  Patient shows 10 sec decrease in time on 9 hole peg test and 15lb increase in right grip strength.   Patient eager to return home and feels ready for discharge.    Patient has met 7 of 7 long term goals due to improved activity tolerance, improved balance, functional use of  RIGHT upper and RIGHT lower extremity, improved attention, and improved coordination.  Patient to discharge at overall Modified Independent level.  Patient's care partner is independent to provide the necessary physical assistance at discharge.    Reasons goals not met: NA  Recommendation:  Patient will benefit from ongoing skilled OT services in outpatient setting to continue to advance functional skills in the area of BADL, iADL, and Vocation.  Equipment: No equipment provided  Reasons for discharge: treatment goals met and discharge from hospital  Patient/family agrees with progress made and goals achieved: Yes  OT Discharge Precautions/Restrictions  Precautions Precautions: Fall Precaution/Restrictions Comments: loop recorder Restrictions Weight Bearing Restrictions Per Provider Order: No   Pain Pain Assessment Pain Scale: 0-10 Pain Score: 4  Pain Type: Acute pain Pain Location: Chest Pain Orientation: Left Pain Descriptors /  Indicators: Aching Pain Onset: On-going Patients Stated Pain Goal: 0 Pain Intervention(s): Repositioned Multiple Pain Sites: No ADL ADL Eating: Independent Where Assessed-Eating: Chair Grooming: Independent Where Assessed-Grooming: Standing at sink Upper Body Bathing: Independent Where Assessed-Upper Body Bathing: Sitting at sink Lower Body Bathing: Modified independent Where Assessed-Lower Body Bathing: Shower Upper Body Dressing: Independent Where Assessed-Upper Body Dressing: Chair Lower Body Dressing: Modified independent Where Assessed-Lower Body Dressing: Standing at sink, Sitting at sink Toileting: Independent Where Assessed-Toileting: Teacher, adult education: Community education officer Method: Proofreader: Engineer, technical sales: Unable to assess Tub/Shower Transfer Method: Unable to assess [employer] Transfer: Modified independent Film/video editor Method: Designer, industrial/product: Information systems manager with back ADL Comments: Loop recorder implanted yesterday - no shower Vision Baseline Vision/History: 1 Wears glasses;4 Cataracts Patient Visual Report: No change from baseline;Peripheral vision impairment;Other (comment) Vision Assessment?: No apparent visual deficits Perception  Perception: Within Functional Limits Praxis Praxis: WFL Cognition Cognition Overall Cognitive Status: Within Functional Limits for tasks assessed Arousal/Alertness: Awake/alert Orientation Level: Person;Place;Situation Memory: Appears intact Attention: Alternating Selective Attention: Appears intact Awareness: Appears intact Problem Solving: Appears intact Safety/Judgment: Appears intact Brief Interview for Mental Status (BIMS) Repetition of Three Words (First Attempt): 3 Temporal Orientation: Year: Correct Temporal Orientation: Month: Accurate within 5 days Temporal Orientation: Day: Correct Recall: "Sock": Yes, no cue  required Recall: "Blue": Yes, no cue required Recall: "Bed": Yes, no cue required BIMS Summary Score: 15 Sensation Sensation Light Touch: Appears Intact Hot/Cold: Appears Intact Proprioception: Appears Intact Stereognosis: Appears Intact Additional Comments: R plantar numbness from back surgery Coordination Gross Motor Movements are Fluid and Coordinated: Yes Fine Motor Movements are Fluid and Coordinated: Yes 9  Hole Peg Test: 23.07 right    22.22 left  (denotes 10 second improvement in RUE) Motor  Motor Motor: Hemiplegia Motor - Discharge Observations: Mild hemiparesis Mobility  Bed Mobility Bed Mobility: Sit to Supine;Supine to Sit Supine to Sit: Independent Sit to Supine: Independent Transfers Sit to Stand: Independent Stand to Sit: Independent  Trunk/Postural Assessment  Cervical Assessment Cervical Assessment: Within Functional Limits Thoracic Assessment Thoracic Assessment: Within Functional Limits Lumbar Assessment Lumbar Assessment: Within Functional Limits Postural Control Postural Control: Within Functional Limits  Balance Balance Balance Assessed: Yes Standardized Balance Assessment Standardized Balance Assessment: Functional Gait Assessment Static Sitting Balance Static Sitting - Balance Support: Feet supported;No upper extremity supported Static Sitting - Level of Assistance: 7: Independent Dynamic Sitting Balance Dynamic Sitting - Balance Support: During functional activity;No upper extremity supported Dynamic Sitting - Level of Assistance: 7: Independent Static Standing Balance Static Standing - Balance Support: No upper extremity supported;During functional activity Static Standing - Level of Assistance: 7: Independent Dynamic Standing Balance Dynamic Standing - Balance Support: Right upper extremity supported;Left upper extremity supported;During functional activity Dynamic Standing - Level of Assistance: 7: Independent Functional Gait   Assessment Gait assessed : Yes Gait Level Surface: Walks 20 ft in less than 5.5 sec, no assistive devices, good speed, no evidence for imbalance, normal gait pattern, deviates no more than 6 in outside of the 12 in walkway width. Change in Gait Speed: Able to smoothly change walking speed without loss of balance or gait deviation. Deviate no more than 6 in outside of the 12 in walkway width. Gait with Horizontal Head Turns: Performs head turns smoothly with no change in gait. Deviates no more than 6 in outside 12 in walkway width Gait with Vertical Head Turns: Performs head turns with no change in gait. Deviates no more than 6 in outside 12 in walkway width. Gait and Pivot Turn: Pivot turns safely within 3 sec and stops quickly with no loss of balance. Step Over Obstacle: Is able to step over one shoe box (4.5 in total height) without changing gait speed. No evidence of imbalance. Gait with Narrow Base of Support: Is able to ambulate for 10 steps heel to toe with no staggering. Gait with Eyes Closed: Walks 20 ft, no assistive devices, good speed, no evidence of imbalance, normal gait pattern, deviates no more than 6 in outside 12 in walkway width. Ambulates 20 ft in less than 7 sec. Ambulating Backwards: Walks 20 ft, no assistive devices, good speed, no evidence for imbalance, normal gait Steps: Alternating feet, no rail. Total Score: 29 FGA comment:: contact guard assistance for stepping over obstaclesa Extremity/Trunk Assessment RUE Assessment RUE Assessment: Within Functional Limits Passive Range of Motion (PROM) Comments: WFL Active Range of Motion (AROM) Comments: Grossly WFL - Limited end range shoulder flexion General Strength Comments: 62 lb, 4+/5 LUE Assessment LUE Assessment: Within Functional Limits Passive Range of Motion (PROM) Comments: WFL Active Range of Motion (AROM) Comments: Grossly WFL - Slightly reduced at end range flexion General Strength Comments: 80 lb   Aubrey Leaf M 08/26/2023, 12:27 PM

## 2023-08-26 NOTE — IPOC Note (Signed)
 Overall Plan of Care [facility]) Patient Details Name: Amy Hooper MRN: [id number] DOB: 1951-02-28  Admitting Diagnosis: Acute ischemic left ACA stroke [facility])  Hospital Problems: Principal Problem:   Acute ischemic left ACA stroke [facility])     Functional Problem List: Nursing Bowel, Safety, Pain, Endurance, Medication Management  PT Balance, Endurance, Motor, Safety, Pain  OT Balance, Motor, Pain, Endurance  SLP    TR         Basic ADL's: OT Bathing, Dressing, Toileting     Advanced  ADL's: OT       Transfers: PT Bed Mobility, Bed to Chair, Car  OT Toilet, Tub/Shower     Locomotion: PT Ambulation, Stairs     Additional Impairments: OT None  SLP        TR      Anticipated Outcomes Item Anticipated Outcome  Self Feeding Independent  Swallowing      Basic self-care  Modified Independent  Toileting  Modified Independent   Bathroom Transfers Modified Independent  Bowel/Bladder  manage bowel w mod I assist  Transfers  mod i  Locomotion  mod I  Communication     Cognition     Pain  Pain < 4 with prns  Safety/Judgment  manage safety w cues   Therapy Plan: PT Intensity: Minimum of 1-2 x/day ,45 to 90 minutes PT Frequency: 5 out of 7 days PT Duration Estimated Length of Stay: 3-4 days OT Intensity: Minimum of 1-2 x/day, 45 to 90 minutes OT Frequency: 5 out of 7 days OT Duration/Estimated Length of Stay: 3-5 days     Team Interventions: Nursing Interventions Medication Management, Bowel Management, Disease Management/Prevention, Discharge Planning, Pain Management, Patient/Family Education  PT interventions Ambulation/gait training, Warden/ranger, Community reintegration, Discharge planning, DME/adaptive equipment instruction, Disease management/prevention, Neuromuscular re-education, Functional mobility training, Pain management, Psychosocial support, Patient/family education, Stair training, Therapeutic Activities, Therapeutic  Exercise, UE/LE Strength taining/ROM, UE/LE Coordination activities  OT Interventions Warden/ranger, Disease mangement/prevention, Neuromuscular re-education, Patient/family education, Self Care/advanced ADL retraining, Therapeutic Exercise, UE/LE Coordination activities, UE/LE Strength taining/ROM, Therapeutic Activities, Pain management, Functional mobility training, DME/adaptive equipment instruction, Discharge planning  SLP Interventions    TR Interventions    SW/CM Interventions Discharge Planning, Psychosocial Support, Patient/Family Education   Barriers to Discharge MD  Medical stability  Nursing Decreased caregiver support, Home environment access/layout 1 level 7ste bil rails w spouse; spouse in [place] settling his Mom in at home from  SNF. [facility] for 2 weeks  PT Decreased caregiver support, Insurance for SNF coverage    OT      SLP      SW       Team Discharge Planning: Destination: PT-Home ,OT- Home , SLP-  Projected Follow-up: PT-Outpatient PT, OT-  Outpatient OT, SLP-  Projected Equipment Needs: PT-To be determined, OT- To be determined, SLP-  Equipment Details: PT- , OT-  Patient/family involved in discharge planning: PT- Patient,  OT-Patient, SLP-   MD ELOS: 4 days modI Medical Rehab Prognosis:  Excellent Assessment: The patient has been admitted for CIR therapies with the diagnosis of left ACA infarct. The team will be addressing functional mobility, strength, stamina, balance, safety, adaptive techniques and equipment, self-care, bowel and bladder mgt, patient and caregiver education. Goals have been set at modI. Anticipated discharge destination is home.        See Team Conference Notes for weekly updates to the plan of care

## 2023-08-26 NOTE — Progress Notes (Signed)
 PROGRESS NOTE   Subjective/Complaints: No new complaints this morning She feels ready for d/c tomorrow Discussed hypotension and tachycardia, medication changes  ROS: right knee OA pain, +neck pain, +anxiety   Objective:   No results found.  Recent Labs    08/24/23 0505 08/25/23 0527  WBC 6.2 5.9  HGB 11.0* 11.4*  HCT 32.2* 33.8*  PLT 272 295   Recent Labs    08/24/23 0505 08/25/23 0527  NA 139 140  K 4.6 4.5  CL 105 107  CO2 25 24  GLUCOSE 85 90  BUN 25* 23  CREATININE 1.37* 1.27*  CALCIUM  9.1 9.3    Intake/Output Summary (Last 24 hours) at 08/26/2023 1200 Last data filed at 08/26/2023 0800 Gross per 24 hour  Intake 297 ml  Output --  Net 297 ml        Physical Exam: Vital Signs Blood pressure (!) 112/53, pulse (!) 106, temperature 98.7 F (37.1 C), temperature source Oral, resp. rate 17, height 5\' 2"  (1.575 m), weight 94.3 kg, SpO2 97%. Gen: no distress, normal appearing HEENT: oral mucosa pink and moist, NCAT Cardiovascular:     Rate and Rhythm: Regular rhythm. Bradycardia present.     Heart sounds: No murmur heard.    No gallop.  Pulmonary:     Effort: Pulmonary effort is normal. No respiratory distress.     Breath sounds: No wheezing or rales.  Abdominal:     General: Bowel sounds are normal. There is no distension.     Tenderness: There is no abdominal tenderness.  Musculoskeletal:        General: Tenderness (mid to upper traps, cervical paraspinals bilaterally.) present. No swelling.     Comments: Upper and mid traps are tight and limit cervical rom to a degree.I did not appreciate any focal knots or bands per se.   Skin:    General: Skin is warm and dry.     Comments: Old L TKA scar  Neurological:     Mental Status: She is alert.     Comments: Alert and oriented x 3. Normal insight and awareness. Intact Memory. Normal language and speech. Cranial nerve exam unremarkable for ? Partial  vision loss in lower right quadrant (vs right inattention).  Pt with decreased sensation along sole of right foot (chronic). Did not appreciate sensory loss elsewhere. DTR's 1+, toes down.    MMT: RUE 4/5 LUE 5/5 RLE 4/5 proximally and 2/5 distally Psychiatric:        Mood and Affect: Mood normal.        Behavior: Behavior normal.     Assessment/Plan: 1. Functional deficits which require 3+ hours per day of interdisciplinary therapy in a comprehensive inpatient rehab setting. Physiatrist is providing close team supervision and 24 hour management of active medical problems listed below. Physiatrist and rehab team continue to assess barriers to discharge/monitor patient progress toward functional and medical goals  Care Tool:  Bathing    Body parts bathed by patient: Right arm, Left arm, Chest, Abdomen, Front perineal area, Buttocks, Right upper leg, Left upper leg, Right lower leg, Left lower leg, Face         Bathing assist Assist  Level: Supervision/Verbal cueing     Upper Body Dressing/Undressing Upper body dressing   What is the patient wearing?: Bra, Pull over shirt    Upper body assist Assist Level: Independent    Lower Body Dressing/Undressing Lower body dressing      What is the patient wearing?: Underwear/pull up, Pants     Lower body assist Assist for lower body dressing: Independent with assitive device     Toileting Toileting    Toileting assist Assist for toileting: Supervision/Verbal cueing     Transfers Chair/bed transfer  Transfers assist     Chair/bed transfer assist level: Independent     Locomotion Ambulation   Ambulation assist      Assist level: Independent Assistive device: No Device Max distance: 200   Walk 10 feet activity   Assist     Assist level: Independent Assistive device: No Device   Walk 50 feet activity   Assist    Assist level: Independent Assistive device: No Device    Walk 150 feet  activity   Assist    Assist level: Independent Assistive device: No Device    Walk 10 feet on uneven surface  activity   Assist     Assist level: Contact Guard/Touching assist Assistive device: Hand held assist   Wheelchair     Assist Is the patient using a wheelchair?: No             Wheelchair 50 feet with 2 turns activity    Assist            Wheelchair 150 feet activity     Assist          Blood pressure (!) 112/53, pulse (!) 106, temperature 98.7 F (37.1 C), temperature source Oral, resp. rate 17, height 5\' 2"  (1.575 m), weight 94.3 kg, SpO2 97%.  Medical Problem List and Plan: 1. Functional deficits secondary to left ACA infarct             -patient may shower             -ELOS/Goals: 4 days, goals mod I with mobility and self-care  Grounds pass ordered  Discussed plan for d/c tomorrow  2.  Antithrombotics: -DVT/anticoagulation:  Pharmaceutical: Lovenox              -antiplatelet therapy: continue DAPT X 3 weeks followed by ASA alone.  3) Pain A) Right knee OA: knee sleeve ordered  B) Neck pain: kpad ordered Headaches/Pain Management: On Fioricet prm --hydrocodone  7.5/325 mg TID prn for LBP  Ford Ide NP) -pt has a lot of pain in her traps and lower neck which generates headaches.             -continue cymbalta  and lyrica  as per home regimen             -add scheduled flexeril 5mg  at bedtime as well.              -kpad  4. Insomnia: ambien  scheduled 5mg  at night  5. Neuropsych/cognition: This patient is  capable of making decisions on her own behalf.  6. Tachycardia: propanolol 10mg  daily started  7. Fluids/Electrolytes/Nutrition: Monitor I/O. Check CMET in am.   A) screening for vitamin D  deficiency: add on vitamin D  level  8. Hypotension: continue cozaar , d/c amlodipine    9. CKD IIIb: SCr baseline 1.4-1.5 range.   6/5 Cr reviewed and has improved to 1.27  10. Dyslipidemia: LDL-136. Had leg pain with Lipitor-->Now   on crestor  trial  11. Fasting hyperglycemia: Hgb A1C-5.0  12. Class 2 obesity: BMI 37. Educate on diet and exercise to promote mobility and health.   13. Moderate OSA: CPAP resumed.   14. Fibromyalgia/LE spasms: Continue Cymbalta  and Lyrica , discussed outpatient aquatherapy-ordered for OT, PT ordered at neuro-rehab clinic    LOS: 3 days A FACE TO FACE EVALUATION WAS PERFORMED  Carlisia Geno P Drue Harr 08/26/2023, 12:00 PM

## 2023-08-26 NOTE — Progress Notes (Signed)
 Inpatient Rehabilitation Care Coordinator Discharge Note DC SAT 6/7  Patient Details  Name: Amy Hooper MRN: [id number] Date of Birth: [birth year]   Discharge location: HOME WITH HUSBAND WHO IS  RETURNING FROM [place] ASSISTING HIS MOM  Length of Stay: 4 DAYS  Discharge activity level: MOD/I LEVEL  Home/community participation: ACTIVE  Patient response WU:[id number] Literacy - How often do you need to have someone help you when you read instructions, pamphlets, or other written material from your doctor or pharmacy?: Never  Patient response YN:[id number] Isolation - How often do you feel lonely or isolated from those around you?: Rarely  Services provided included: MD, RD, PT, OT, RN, CM, Pharmacy, SW  Financial Services:  Financial Services Utilized: Medicare    Choices offered to/list presented to: PT  Follow-up services arranged:  Outpatient    Outpatient Servicies: [facility]  &  OT WILL CALL TO SET UP FOLLOW UP APPOINTMENTS   HAS ALL NEEDED EQUIPMENT   Patient response to transportation need: Is the patient able to respond to transportation needs?: Yes In the past 12 months, has lack of transportation kept you from medical appointments or from getting medications?: No In the past 12 months, has lack of transportation kept you from meetings, work, or from getting things needed for daily living?: No   Patient/Family verbalized understanding of follow-up arrangements:  Yes  Individual responsible for coordination of the follow-up plan: SELF [phone]  Confirmed correct DME delivered: [name] 08/26/2023    Comments (or additional information):PT DID WELL AND REACHED GOALS QUICKLY READY TO GO HOME  Summary of Stay    Date/Time Discharge Planning CSW  08/24/23 0955 New evaluation today-according to acute chart home with husband who is back from [place] RGD       Bruin Bolger G

## 2023-08-26 NOTE — Progress Notes (Signed)
 Physical Therapy Discharge Summary  Patient Details  Name: Amy Hooper MRN: [id number] Date of Birth: [birth year]  Date of Discharge from PT service: 08/26/23 -    Today's Date: 08/26/2023 PT Individual Time: 4098-1191 + 4782-9562 PT Individual Time Calculation (min): 75 min + 45 min    Patient has met 11 of 11 long term goals due to improved balance, improved postural control, and increased strength.  Patient to discharge at an ambulatory level Independent.   Patient's care partner is independent to provide the necessary physical assistance at discharge.  Reasons goals not met: na  Recommendation:  Patient will benefit from ongoing skilled PT services in outpatient setting to continue to advance safe functional mobility, address ongoing impairments in LE extremity strength, coordination, and dual-tasking movements and minimize fall risk.  Equipment: No equipment provided  Reasons for discharge: treatment goals met  Patient/family agrees with progress made and goals achieved: Yes  PT Discharge Precautions/Restrictions Precautions Precautions: Fall Precaution/Restrictions Comments: loop recorder Restrictions Weight Bearing Restrictions Per Provider Order: No   Pain Interference Pain Interference Pain Effect on Sleep: 2. Occasionally Pain Interference with Therapy Activities: 1. Rarely or not at all Pain Interference with Day-to-Day Activities: 2. Occasionally Vision/Perception  Vision - History Ability to See in Adequate Light: 0 Adequate Perception Perception: Within Functional Limits Praxis Praxis: WFL  Cognition Overall Cognitive Status: Within Functional Limits for tasks assessed Arousal/Alertness: Awake/alert Orientation Level: Oriented X4 Year: 2025 Month: June Day of Week: Correct Attention: Focused Focused Attention: Appears intact Selective Attention: Appears intact Memory: Appears intact Awareness: Appears intact Problem Solving: Appears  intact Safety/Judgment: Appears intact Sensation Sensation Light Touch: Appears Intact Hot/Cold: Appears Intact Proprioception: Appears Intact Stereognosis: Appears Intact Additional Comments: R plantar numbness from back surgery Coordination Gross Motor Movements are Fluid and Coordinated: Yes Fine Motor Movements are Fluid and Coordinated: Yes Motor  Motor Motor: Hemiplegia Motor - Discharge Observations: Mild hemiparesis  Mobility Bed Mobility Bed Mobility: Sit to Supine;Supine to Sit Supine to Sit: Independent Sit to Supine: Independent Transfers Transfers: Sit to Stand;Stand Pivot Transfers;Stand to Sit Sit to Stand: Independent Stand to Sit: Independent Stand Pivot Transfers: Independent Transfer (Assistive device): None Locomotion  Gait Ambulation: Yes Gait Assistance: Independent Gait Distance (Feet): 500 Feet Assistive device: Rolling walker Gait Gait: Yes Gait Pattern: Within Functional Limits Gait Pattern: Within Functional Limits Stairs / Additional Locomotion Stairs: Yes Stairs Assistance: Independent Stair Management Technique: Two rails;Alternating pattern Number of Stairs: 12 Height of Stairs: 6 Ramp: Independent Curb: Independent Wheelchair Mobility Wheelchair Mobility: No  Trunk/Postural Assessment  Cervical Assessment Cervical Assessment: Within Functional Limits Thoracic Assessment Thoracic Assessment: Within Functional Limits Lumbar Assessment Lumbar Assessment: Within Functional Limits Postural Control Postural Control: Within Functional Limits  Balance Standardized Balance Assessment Standardized Balance Assessment: Functional Gait Assessment Functional Gait  Assessment Gait assessed : Yes Gait Level Surface: Walks 20 ft in less than 5.5 sec, no assistive devices, good speed, no evidence for imbalance, normal gait pattern, deviates no more than 6 in outside of the 12 in walkway width. Change in Gait Speed: Able to smoothly change  walking speed without loss of balance or gait deviation. Deviate no more than 6 in outside of the 12 in walkway width. Gait with Horizontal Head Turns: Performs head turns smoothly with no change in gait. Deviates no more than 6 in outside 12 in walkway width Gait with Vertical Head Turns: Performs head turns with no change in gait. Deviates no more than 6 in outside 12 in  walkway width. Gait and Pivot Turn: Pivot turns safely within 3 sec and stops quickly with no loss of balance. Step Over Obstacle: Is able to step over one shoe box (4.5 in total height) without changing gait speed. No evidence of imbalance. Gait with Narrow Base of Support: Is able to ambulate for 10 steps heel to toe with no staggering. Gait with Eyes Closed: Walks 20 ft, no assistive devices, good speed, no evidence of imbalance, normal gait pattern, deviates no more than 6 in outside 12 in walkway width. Ambulates 20 ft in less than 7 sec. Ambulating Backwards: Walks 20 ft, no assistive devices, good speed, no evidence for imbalance, normal gait Steps: Alternating feet, no rail. Total Score: 29 FGA comment:: contact guard assistance for stepping over obstaclesa Extremity Assessment      RLE Strength Right Hip Flexion: 4-/5 Right Knee Flexion: 4-/5 Right Knee Extension: 4-/5 Right Ankle Dorsiflexion: 4-/5 Right Ankle Plantar Flexion: 3-/5 Right Ankle Inversion: 4-/5 Right Ankle Eversion: 4-/5 LLE Assessment LLE Assessment: Exceptions to [facility] LLE Strength Left Hip Flexion: 3+/5 Left Knee Flexion: 4-/5 Left Knee Extension: 4-/5 Left Ankle Dorsiflexion: 4-/5 Left Ankle Plantar Flexion: 4-/5 Left Ankle Inversion: 4-/5 Left Ankle Eversion: 3+/5  Session 1 - 1000-1115  Treatment  Patient dancing on bed at entrance to room. Patient alert and agreeable to PT session.   Patient reported that she is feeling well and excited to discharge, pt. Feels ready to continue to progress.   Gait Training:  Pt ambulated 336ft  without AD with supervision and verbal cueing. Pt demonstrated the following gait deviations with therapist providing the described cuing and facilitation for improvement:    Neuromuscular Re-ed: NMR facilitated during session with focus on FGA outcome measure, stepping over objects, walking with heads turns, and ambulating backwards.    NMR performed for improvements in motor control and coordination, balance, sequencing, judgement, and self confidence/ efficacy in performing all aspects of mobility at highest level of independence.   Therapeutic Exercise: Pt performed the following exercises with therapist providing the described cuing and facilitation for improvement.  -Nustep -  1/2 miles  Patient ended session with transfer of care to OT in the ortho gym.   Session 2 - 1610-9604  Patient was seated EOB on entrance to room. Patient alert and agreeable to PT session.   Patient reported that she was tired after last session, but felt great that she was being challenged.    Gait Training:  Pt ambulated 668ft without AD with supervision. Pt. Did not need any verbal cueing to swing arms or clear the floor.    Neuromuscular Re-ed: NMR facilitated during session with focus on walking on uneven surfaces outside on mulch and in grass. Pt. reported this environment represented her home environment well. She felt great walking up steep hills and navigating mulch with supervision. Pt. Did not demonstrate any gait pertubation during uneven terrain, showing excellence balance. The pt. Also performed stair stepping and performed a reciprocal pattern for the first time.   NMR performed for improvements in motor control and coordination, balance, sequencing, judgement, and self confidence/ efficacy in performing all aspects of mobility at highest level of independence.      Patient was left at EOB at end of session with call bell within reach.    Jill Stopka 08/26/2023, 11:08 AM

## 2023-08-26 NOTE — Progress Notes (Signed)
 Occupational Therapy Session Note  Patient Details  Name: Amy Hooper MRN: [id number] Date of Birth: 29-Dec-1950  Today's Date: 08/26/2023 OT Individual Time: 4782-9562 OT Individual Time Calculation (min): 43 min    Short Term Goals: Week 1:  OT Short Term Goal 1 (Week 1): STG=LTG due to LOS - Overall goal of Modified I with BADL  Skilled Therapeutic Interventions/Progress Updates:    Patient received supine in bed eager for shower.  Covered dressing of loop recorder and allowed patient to walk without device to bathroom.  Patient opted to use the shower seat and has one at home.  She would have sat and stood for shower at home.  Patient needs no assistance or set up for her shower.  Patient had already picked out her clothing, and was able to dress herself without help including socks, shoes, and knee brace.   Patient so pleased with progress.  Patient made independent in her room and reviewed that she can move around room without calling for help.    Therapy Documentation Precautions:  Precautions Precautions: Fall Precaution/Restrictions Comments: loop recorder Restrictions Weight Bearing Restrictions Per Provider Order: No   Pain: Pain Assessment Pain Scale: 0-10 Pain Score: 0-No pain     Therapy/Group: Individual Therapy  Austyn Seier M 08/26/2023, 12:12 PM

## 2023-08-27 DIAGNOSIS — I10 Essential (primary) hypertension: Secondary | ICD-10-CM | POA: Diagnosis not present

## 2023-08-27 DIAGNOSIS — I63522 Cerebral infarction due to unspecified occlusion or stenosis of left anterior cerebral artery: Secondary | ICD-10-CM | POA: Diagnosis not present

## 2023-08-27 NOTE — Progress Notes (Signed)
 PROGRESS NOTE   Subjective/Complaints:  Pt doing well, ready to go home. Slept well, pain doing fine, LBM this morning, urinating fine. Denies any other complaints or concerns. Knows she's switching to propranolol  instead of amlodipine  going home.   ROS: right knee OA pain, +neck pain, +anxiety   Objective:   No results found.  Recent Labs    08/25/23 0527  WBC 5.9  HGB 11.4*  HCT 33.8*  PLT 295   Recent Labs    08/25/23 0527  NA 140  K 4.5  CL 107  CO2 24  GLUCOSE 90  BUN 23  CREATININE 1.27*  CALCIUM  9.3    Intake/Output Summary (Last 24 hours) at 08/27/2023 1034 Last data filed at 08/27/2023 0800 Gross per 24 hour  Intake 556 ml  Output --  Net 556 ml        Physical Exam: Vital Signs Blood pressure 105/68, pulse 60, temperature 98.2 F (36.8 C), temperature source Oral, resp. rate 16, height 5\' 2"  (1.575 m), weight 94.3 kg, SpO2 97%.  Gen: no distress, normal appearing HEENT: oral mucosa pink and moist, NCAT Cardiovascular:     Rate and Rhythm: Regular rhythm. Bradycardia present.     Heart sounds: No murmur heard. No rub    No gallop.  Pulmonary:     Effort: Pulmonary effort is normal. No respiratory distress.     Breath sounds: No wheezing or rales.  Abdominal:     General: Bowel sounds are normal. There is no distension.     Tenderness: There is no abdominal tenderness.  Psychiatric:        Mood and Affect: Mood normal.        Behavior: Behavior normal. Musculoskeletal:    MAE antigravity Neuro: A&O x3  PRIOR EXAMS: Musculoskeletal:    General: Tenderness (mid to upper traps, cervical paraspinals bilaterally.) present. No swelling.     Comments: Upper and mid traps are tight and limit cervical rom to a degree.I did not appreciate any focal knots or bands per se.   Skin:    General: Skin is warm and dry.     Comments: Old L TKA scar  Neurological:     Mental Status: She is alert.      Comments: Alert and oriented x 3. Normal insight and awareness. Intact Memory. Normal language and speech. Cranial nerve exam unremarkable for ? Partial vision loss in lower right quadrant (vs right inattention).  Pt with decreased sensation along sole of right foot (chronic). Did not appreciate sensory loss elsewhere. DTR's 1+, toes down.    MMT: RUE 4/5 LUE 5/5 RLE 4/5 proximally and 2/5 distally      Assessment/Plan: 1. Functional deficits which require 3+ hours per day of interdisciplinary therapy in a comprehensive inpatient rehab setting. Physiatrist is providing close team supervision and 24 hour management of active medical problems listed below. Physiatrist and rehab team continue to assess barriers to discharge/monitor patient progress toward functional and medical goals  Care Tool:  Bathing    Body parts bathed by patient: Right arm, Left arm, Chest, Abdomen, Front perineal area, Buttocks, Right upper leg, Left upper leg, Right lower leg, Left lower leg,  Face         Bathing assist Assist Level: Independent with assistive device     Upper Body Dressing/Undressing Upper body dressing   What is the patient wearing?: Bra, Pull over shirt    Upper body assist Assist Level: Independent    Lower Body Dressing/Undressing Lower body dressing      What is the patient wearing?: Underwear/pull up, Pants     Lower body assist Assist for lower body dressing: Independent with assitive device     Toileting Toileting    Toileting assist Assist for toileting: Independent     Transfers Chair/bed transfer  Transfers assist     Chair/bed transfer assist level: Independent     Locomotion Ambulation   Ambulation assist      Assist level: Independent Assistive device: No Device Max distance: 200   Walk 10 feet activity   Assist     Assist level: Independent Assistive device: No Device   Walk 50 feet activity   Assist    Assist level:  Independent Assistive device: No Device    Walk 150 feet activity   Assist    Assist level: Independent Assistive device: No Device    Walk 10 feet on uneven surface  activity   Assist     Assist level: Contact Guard/Touching assist Assistive device: Hand held assist   Wheelchair     Assist Is the patient using a wheelchair?: No             Wheelchair 50 feet with 2 turns activity    Assist            Wheelchair 150 feet activity     Assist          Blood pressure 105/68, pulse 60, temperature 98.2 F (36.8 C), temperature source Oral, resp. rate 16, height 5\' 2"  (1.575 m), weight 94.3 kg, SpO2 97%.  Medical Problem List and Plan: 1. 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  Antithrombotics: -DVT/anticoagulation:  Pharmaceutical: Lovenox              -antiplatelet therapy: continue DAPT X 3 weeks followed by ASA alone.  3) Pain A) Right knee OA: knee sleeve ordered  B) Neck pain: kpad ordered Headaches/Pain Management: On Fioricet  prm --hydrocodone  7.5/325 mg TID prn for LBP  Ford Ide NP) -pt has a lot of pain in her traps and lower neck which generates headaches.             -continue cymbalta  and lyrica  as per home regimen             -add scheduled flexeril  5mg  at bedtime as well.              -kpad  4. Insomnia: ambien  scheduled 5mg  at night  5. Neuropsych/cognition: This patient is  capable of making decisions on her own behalf.  6. Tachycardia: propanolol 10mg  daily started  7. Fluids/Electrolytes/Nutrition: Monitor I/O. Check CMET in am.   A) screening for vitamin D  deficiency: add on vitamin D  level  8. Hypotension: continue cozaar , d/c amlodipine , started on propranolol  prior to d/c  -08/27/23 BPs fine overnight,  discussed med changes.  Vitals:   08/23/23  1954 08/24/23 0525 08/24/23 1415 08/24/23 2007  BP: (!) 131/55 (!) 112/58 130/61 (!) 164/75   08/25/23 0446 08/25/23 1353 08/25/23 2010 08/26/23 0448  BP: (!) 151/79 (!) 131/58 133/60 (!) 112/53   08/26/23 1431 08/26/23 2004 08/27/23 0535 08/27/23 0837  BP: 139/72 (!) 118/56 102/66 105/68      9. CKD IIIb: SCr baseline 1.4-1.5 range.   6/5 Cr reviewed and has improved to 1.27  10. Dyslipidemia: LDL-136. Had leg pain with Lipitor-->Now  on crestor  trial  11. Fasting hyperglycemia: Hgb A1C-5.0  12. Class 2 obesity: BMI 37. Educate on diet and exercise to promote mobility and health.   13. Moderate OSA: CPAP resumed.   14. Fibromyalgia/LE spasms: Continue Cymbalta  and Lyrica , discussed outpatient aquatherapy-ordered for OT, PT ordered at neuro-rehab clinic    LOS: 4 days A FACE TO FACE EVALUATION WAS PERFORMED  [address] 08/27/2023, 10:34 AM

## 2023-08-27 NOTE — Progress Notes (Signed)
Patient discharged to home, accompanied by her husband. 

## 2023-08-27 NOTE — Progress Notes (Addendum)
 Inpatient Rehabilitation Discharge Medication Review by a Pharmacist  A complete drug regimen review was completed for this patient to identify any potential clinically significant medication issues.  High Risk Drug Classes Is patient taking? Indication by Medication  Antipsychotic No   Anticoagulant No   Antibiotic No   Opioid Yes Norco: pain  Antiplatelet Yes Aspirin /Plavix , DAPT x 3 weeks followed by aspirin  alone starting 09/08/2023: CVA ppx  Hypoglycemics/insulin Yes Farxiga : diabetes/CKD  Vasoactive Medication Yes Losartan , propranolol : HTN, tachycardia  Chemotherapy No   Other Yes Allopurinol : gout Crestor : HLD, CVA ppx Duloxetine : pain Capsaicin  patch: pain Ketoconazole  cream, mometasone  cream: rash Flexeril : pain/muscle spasms Lyrica : pain vitD: supplement Lunesta : sleep Miralax , Senokot: constipation     Type of Medication Issue Identified Description of Issue Recommendation(s)  Drug Interaction(s) (clinically significant)     Duplicate Therapy     Allergy     No Medication Administration End Date     Incorrect Dose     Additional Drug Therapy Needed     Significant med changes from prior encounter (inform family/care partners about these prior to discharge). Amlodipine  discontinued d/t hypotension Restart or discontinue as appropriate. Communicate medication changes with patient/family at discharge  Other       Clinically significant medication issues were identified that warrant physician communication and completion of prescribed/recommended actions by midnight of the next day:  Yes > resolved  Name of provider notified for urgent issues identified: [employer] of Notification: Secure chat    Pharmacist comments:  1. Amlodipine  discontinued.   Time spent performing this drug regimen review (minutes): 45   Thank you for allowing pharmacy to be a part of this patient's care.   Victoria Grass, PharmD 07/27/2023 9:13  AM    **Pharmacist phone directory can be found on [email or web address] listed under [facility]**

## 2023-08-30 NOTE — Discharge Summary (Signed)
 Physician Discharge Summary  Patient ID: Amy Hooper MRN: [id number] DOB/AGE: 1950/09/19 73 y.o.  Admit date: 08/23/2023 Discharge date: 08/27/2023  Discharge Diagnoses:  Principal Problem:   Acute ischemic left ACA stroke [facility]) Active Problems:   Morbid obesity (HCC)   Insomnia   CKD (chronic kidney disease) stage 3, GFR 30-59 ml/min (HCC)   Bilateral post-traumatic osteoarthritis of knee   OSA (obstructive sleep apnea)   Discharged Condition: stable  Significant Diagnostic Studies: N/A   Labs:  Basic Metabolic Panel: Recent Labs  Lab 08/24/23 0505 08/25/23 0527  NA 139 140  K 4.6 4.5  CL 105 107  CO2 25 24  GLUCOSE 85 90  BUN 25* 23  CREATININE 1.37* 1.27*  CALCIUM  9.1 9.3    CBC: Recent Labs  Lab 08/24/23 0505 08/25/23 0527  WBC 6.2 5.9  NEUTROABS 2.6  --   HGB 11.0* 11.4*  HCT 32.2* 33.8*  MCV 87.7 87.3  PLT 272 295    CBG: Recent Labs  Lab 08/25/23 0653  GLUCAP 75    Brief HPI:   Amy Hooper is a 73 y.o. female with history of HTN, T2DM, OSA, CKD 3B, fibromyalgia who was admitted on 08/18/2023 with reports of funny feeling oral, progressive RUE weakness at night prior to admission the next day with RUE weakness on awakening.  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  MRI brain showed area of small acute ischemia within posterior paramedian left frontal lobe and multifocal hyperintense T2 weighted signals within white matter.  Dr. Christiane Cowing felt the stroke was of unclear etiology question cardiac source and loop recorder was placed at discharge.  Per records patient stopped taking her ASA due to GERD but was willing to resume this.  BLE Dopplers were negative for DVT.  Neurology recommended DAPT x 3 weeks followed by aspirin  alone.  PT/OT were consulted and patient was requiring contact-guard assist with mobility and ADLs.  She was independent in working prior to admission.  CIR was recommended due to  functional decline.   Hospital Course: Amy Hooper was admitted to rehab 08/23/2023 for inpatient therapies to consist of PT and OT at least three hours five days a week. Past admission physiatrist, therapy team and rehab RN have worked together to provide customized collaborative inpatient rehab.  She continues on DAPT x 3 weeks to be followed by aspirin  alone.  She has had issues with tachycardia and low-dose Inderal  10 mg was added.  Amlodipine  was discontinued and she continues on Cozaar  for management of blood pressure.  Follow-up check of electrolytes shows CKD to be stable.  Repeat CBC shows H&H relatively stable with stable platelets. K pad was added to help with shoulder/trapezius pain. She continues on chronic hydrocodone  prn additionally for pain management.   She developed knee pain with increase in activity and knee sleeves were ordered for support.  She has been educated on diet and exercise to promote health and mobility.  She continues on Ambien  to manage insomnia.  She has had issues with pain in her traps and low back and Flexeril  was scheduled at night to help manage symptoms. CPAP was resumed to manage OSA.  She has made good gains during his rehab and is modified independent at discharge.  She will continue to receive outpatient PT and OT at [facility] after discharge.    Rehab course: During patient's stay in rehab team conference was held to monitor patient's progress,  set goals and discuss barriers to discharge. At admission, patient required min assist with ADL tasks and supervision with mobility.  She  has had improvement in activity tolerance, balance, postural control as well as ability to compensate for deficits. She has had improvement in functional use right grip and RLE s well as improvement in awareness. She is able to complete ADL tasks at modified independent level. She is modified independent for transfers and is able to ambulate 500' with use of RW. She  is able to climb 12 stairs at modified independent level.   Discharge disposition: 01-Home or Self Care  Diet: Heart healthy  Special Instructions: No driving till cleared by MD   Discharge Instructions     Ambulatory referral to Neurology   Complete by: As directed    An appointment is requested in approximately: 6 weeks   Ambulatory referral to Physical Medicine Rehab   Complete by: As directed       Allergies as of 08/27/2023       Reactions   Biaxin [clarithromycin] Hives, Rash   Ceftin [cefuroxime] Hives, Rash   Nsaids Nausea And Vomiting   Lortab [hydrocodone -acetaminophen ] Other (See Comments)   Stomach pain   Percocet [oxycodone -acetaminophen ] Other (See Comments)   Hallucination   Robaxin  [methocarbamol ] Nausea And Vomiting, Other (See Comments)   Stomach pain   Toradol  [ketorolac  Tromethamine ] Nausea And Vomiting   Ultram  [tramadol ] Nausea And Vomiting, Other (See Comments)   Stomach pain        Medication List     STOP taking these medications    amLODipine  2.5 MG tablet Commonly known as: NORVASC        TAKE these medications    allopurinol  100 MG tablet Commonly known as: ZYLOPRIM  Take 2 tablets (200 mg total) by mouth daily.   aspirin  EC 81 MG tablet Take 1 tablet (81 mg total) by mouth daily. Swallow whole.   clopidogrel  75 MG tablet Commonly known as: PLAVIX  Take 1 tablet (75 mg total) by mouth daily.   cyclobenzaprine  5 MG tablet Commonly known as: FLEXERIL  Take 1 tablet (5 mg total) by mouth at bedtime.   dapagliflozin  propanediol 5 MG Tabs tablet Commonly known as: FARXIGA  Take 1 tablet (5 mg total) by mouth daily.   DULoxetine  20 MG capsule Commonly known as: CYMBALTA  TAKE 1 CAPSULE BY MOUTH DAILY What changed: when to take this   eszopiclone  1 MG Tabs tablet Commonly known as: LUNESTA  Take 1 tablet (1 mg total) by mouth at bedtime as needed for sleep. Take immediately before bedtime What changed:  when to take  this additional instructions   fluticasone  50 MCG/ACT nasal spray Commonly known as: FLONASE  Place 1 spray into both nostrils daily as needed for allergies.   HYDROcodone -acetaminophen  7.5-325 MG tablet Commonly known as: NORCO Take 1 tablet by mouth 3 (three) times daily as needed for moderate pain (pain score 4-6).   hydrocortisone  2.5 % rectal cream Commonly known as: ANUSOL -HC PLACE 1 APPLICATION RECTALLY 2 (TWO) TIMES DAILY.   ketoconazole  2 % cream Commonly known as: NIZORAL  Apply 1 Application topically daily. Apply to rash twice a day. If no improvement after 2 weeks, please let provider know.   loratadine  10 MG tablet Commonly known as: CLARITIN  Take 10 mg by mouth daily as needed for allergies.   losartan  50 MG tablet Commonly known as: COZAAR  Take 1 tablet (50 mg total) by mouth daily.   mometasone  0.1 % cream Commonly known as: ELOCON  Apply very thin layer  to rash at bedtime. May use one other time during the day if needed. Stop after 7 days, if still needed may repeat after 3 day break.   polyethylene glycol 17 g packet Commonly known as: MIRALAX  / GLYCOLAX  Take 17 g by mouth daily.   pregabalin  75 MG capsule Commonly known as: Lyrica  Take 1 capsule (75 mg total) by mouth 2 (two) times daily.   propranolol  10 MG tablet Commonly known as: INDERAL  Take 1 tablet (10 mg total) by mouth daily.   rosuvastatin  20 MG tablet Commonly known as: CRESTOR  Take 1 tablet (20 mg total) by mouth daily.   senna-docusate 8.6-50 MG tablet Commonly known as: Senokot-S Take 2 tablets by mouth 2 (two) times daily. What changed:  when to take this reasons to take this   Vitamin D  (Ergocalciferol ) 1.25 MG (50000 UNIT) Caps capsule Commonly known as: DRISDOL  Take 1 capsule (50,000 Units total) by mouth every 7 (seven) days. Notes to patient: Resume at home        Follow-up Information     Early, [name], NP Follow up.   Specialty: Nurse Practitioner Why: Call in  1-2 days for post hospital follow up Contact information: [address] [place] [address] [phone]         Liam Redhead, MD Follow up.   Specialty: Physical Medicine and Rehabilitation Why: office will call you with follow up appointment Contact information: [address] [place] [address] [phone]         [facility] Follow up.   Why: office will call you with follow up appointment Contact information: [address]     [address] [place]   [address] [phone]                Signed: Zelda Hickman 08/30/2023, 6:07 PM

## 2023-08-31 ENCOUNTER — Other Ambulatory Visit: Payer: Self-pay | Admitting: Physical Medicine and Rehabilitation

## 2023-09-07 ENCOUNTER — Telehealth: Payer: Self-pay | Admitting: *Deleted

## 2023-09-07 NOTE — Telephone Encounter (Signed)
 Pt last seen 08-26-2021.  Needs appt for cpap f/u and supplies.  She has appt with amy Lomax, NP for hospital fu re: stroke.  Received fax from [employer] about needing supplies.  I faxed back pt needed an appt, one is already scheduled 09-26-2023.

## 2023-09-12 ENCOUNTER — Encounter: Payer: Self-pay | Admitting: Physical Medicine and Rehabilitation

## 2023-09-13 ENCOUNTER — Other Ambulatory Visit: Payer: Self-pay

## 2023-09-13 ENCOUNTER — Ambulatory Visit: Attending: Physical Medicine and Rehabilitation | Admitting: Occupational Therapy

## 2023-09-13 ENCOUNTER — Ambulatory Visit

## 2023-09-13 DIAGNOSIS — M5459 Other low back pain: Secondary | ICD-10-CM

## 2023-09-13 DIAGNOSIS — R2681 Unsteadiness on feet: Secondary | ICD-10-CM | POA: Insufficient documentation

## 2023-09-13 DIAGNOSIS — G8929 Other chronic pain: Secondary | ICD-10-CM

## 2023-09-13 DIAGNOSIS — I63522 Cerebral infarction due to unspecified occlusion or stenosis of left anterior cerebral artery: Secondary | ICD-10-CM | POA: Diagnosis present

## 2023-09-13 DIAGNOSIS — I639 Cerebral infarction, unspecified: Secondary | ICD-10-CM | POA: Insufficient documentation

## 2023-09-13 DIAGNOSIS — M6281 Muscle weakness (generalized): Secondary | ICD-10-CM | POA: Diagnosis present

## 2023-09-13 DIAGNOSIS — M25561 Pain in right knee: Secondary | ICD-10-CM | POA: Insufficient documentation

## 2023-09-13 NOTE — Therapy (Signed)
 OUTPATIENT OCCUPATIONAL THERAPY NEURO EVALUATION  Patient Name: Amy Hooper MRN: [id number] DOB:[birth year], 73 y.o., female Today's Date: 09/13/2023  PCP: Oris Camie BRAVO, NP REFERRING PROVIDER: Lorilee Sven SQUIBB, MD  END OF SESSION:  OT End of Session - 09/13/23 [phone]     Visit Number 1    Number of Visits 1    Authorization Type Medicare A&B / [employer] Supplemental    OT Start Time 0802    OT Stop Time 0844    OT Time Calculation (min) 42 min          Past Medical History:  Diagnosis Date   Allergy 1976   Tramadol  NSAIDS   Chronic kidney disease    Depression    Fibromyalgia    Gout    HTN (hypertension)    Hyperlipidemia 09/01/22   Insomnia    Morbid obesity (HCC)    Non-recurrent acute suppurative otitis media of left ear without spontaneous rupture of tympanic membrane 04/30/2022   Osteoarthritis    Palpitation    Sleep apnea    Past Surgical History:  Procedure Laterality Date   APPLICATION OF WOUND VAC N/A 03/25/2021   Procedure: APPLICATION OF WOUND VAC;  Surgeon: Barbarann Oneil BROCKS, MD;  Location: [facility] OR;  Service: Orthopedics;  Laterality: N/A;   APPLICATION OF WOUND VAC N/A 03/30/2021   Procedure: WOUND VAC CHANGE 12x6x5;  Surgeon: Barbarann Oneil BROCKS, MD;  Location: [facility] OR;  Service: Orthopedics;  Laterality: N/A;   INCISION AND DRAINAGE OF WOUND N/A 03/25/2021   Procedure: LUMBAR POST OP INCISION IRRIGATION;  Surgeon: Barbarann Oneil BROCKS, MD;  Location: [facility] OR;  Service: Orthopedics;  Laterality: N/A;   JOINT REPLACEMENT     KNEE ARTHROSCOPY     LOOP RECORDER INSERTION N/A 08/23/2023   Procedure: LOOP RECORDER INSERTION;  Surgeon: Nancey Eulas BRAVO, MD;  Location: [facility] INVASIVE CV LAB;  Service: Cardiovascular;  Laterality: N/A;   LUMBAR WOUND DEBRIDEMENT N/A 03/30/2021   Procedure: REPEAT LUMBAR WOUND DEBRIDEMENT;  Surgeon: Barbarann Oneil BROCKS, MD;  Location: [facility] OR;  Service: Orthopedics;  Laterality: N/A;   right rotator cuff     ROTATOR CUFF REPAIR Left    TOTAL KNEE  ARTHROPLASTY Left 09/08/2020   Procedure: LEFT TOTAL KNEE ARTHROPLASTY;  Surgeon: Jerri Kay HERO, MD;  Location: [facility] OR;  Service: Orthopedics;  Laterality: Left;   TUBAL LIGATION     Patient Active Problem List   Diagnosis Date Noted   Acute ischemic left ACA stroke (HCC) 08/23/2023   CVA (cerebrovascular accident) (HCC) 08/18/2023   Premature atrial complexes 08/18/2023   Obesity (BMI 30-39.9) 08/18/2023   Nocturnal leg cramps 07/27/2023   PAC (premature atrial contraction) 12/20/2022   Forearm tendonitis 07/23/2022   Rash and nonspecific skin eruption 07/23/2022   Asymptomatic bradycardia 07/23/2022   Rheumatoid arthritis with rheumatoid factor of multiple sites without organ or systems involvement (HCC) 04/30/2022   Osteopenia 03/03/2022   Degenerative spondylolisthesis 02/19/2021   Status post total left knee replacement 09/08/2020   OSA (obstructive sleep apnea) 05/19/2020   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/17/2020   Chronic pain syndrome 03/03/2020   S/P lumbar fusion 02/05/2020   Vitamin D  deficiency 01/09/2020   Hyperlipidemia 01/09/2020   CKD (chronic kidney disease) stage 3, GFR 30-59 ml/min (HCC) 01/09/2020   Essential hypertension    Morbid obesity (HCC)    Gout    Fibromyalgia    Depression    Insomnia    Chronically low serum potassium 04/22/2014  Environmental and seasonal allergies 04/22/2014   Palpitations 04/22/2014    ONSET DATE: 08/18/23  REFERRING DIAG: I63.9 (ICD-10-CM) - Cerebrovascular accident (CVA), unspecified mechanism  THERAPY DIAG:  Muscle weakness (generalized)  Rationale for Evaluation and Treatment: Rehabilitation  SUBJECTIVE:   SUBJECTIVE STATEMENT: Pt reports that her back has been hurting her worse since being home from the hospital.  Pt reports that it may have even started before her stroke.  Pt reports that she plans to follow up with PCP about her back.  Pt reports that she doesn't have any concerns about ADLs  and IADLs at this time but is open to having assessment as she does recognize her arm was weaker.  Pt reports working in the garden and has been using body mechanics to aid in ease and safety.   Pt accompanied by: self  PERTINENT HISTORY: 73 year old female with history of HTN, T2DM, OSA, CKD IIIb, lumbar surgery w/wound infection, Fibromyalgia who was admitted on 08/18/23 with reports of funny feeling aura progressive RLE weakness night prior to admission followed by RUE weakness on awakening. She has history of back surgery and issues with LE spasms. 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 Dr. Jerri felt that stroke with unclear etiology question cardiac and loop recorder placed today. Patient transferred to CIR on 08/23/2023  and d/c from CIR 08/27/2023  PRECAUTIONS: None  WEIGHT BEARING RESTRICTIONS: No  PAIN:  Are you having pain? Yes: NPRS scale: 6/10 Pain location: back, R knee Pain description: shooting Aggravating factors: weather, walking, standing or sitting for too long Relieving factors: changing positions, lying down and putting feet up, heat  FALLS: Has patient fallen in last 6 months? 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  HAND DOMINANCE: Right  ADLs: Overall ADLs: Mod I to Independent, reports somewhat slower Transfers/ambulation related to ADLs: switches between [facility] and Rollator based on back pain Equipment: Grab bars, Walk in shower, and bed side commode, built in shower seat  IADLs: Shopping: Mod I Light housekeeping: Mod I Meal Prep: reports has returned to grilling with no issues Community mobility: has not yet returned to driving, questions her R knee stability Medication management: Independent   MOBILITY STATUS: utilizing SPC for mobility, intermittently utilizing Rollator based on level of back pain  POSTURE COMMENTS:  No Significant postural limitations  ACTIVITY TOLERANCE: Activity tolerance: WFL, reports somewhat slower  FUNCTIONAL OUTCOME MEASURES: 9 hole peg test: 9 Hole Peg test: Right: 22.5 sec; Left: 24.88 sec  UPPER EXTREMITY ROM:    Active ROM Right eval Left eval  Shoulder flexion [facility]  Shoulder abduction [facility]  Shoulder adduction    Shoulder extension    Shoulder internal rotation [facility]  Shoulder external rotation [facility]  Elbow flexion [facility] WFL  Elbow extension [facility] WFL  Wrist flexion    Wrist extension    Wrist ulnar deviation    Wrist radial deviation    Wrist pronation    Wrist supination    (Blank rows = not tested)  UPPER EXTREMITY MMT:     MMT Right eval Left eval  Shoulder flexion 4+/5 5/5  Shoulder abduction    Shoulder adduction    Shoulder extension    Shoulder internal rotation    Shoulder external rotation    Middle trapezius    Lower trapezius    Elbow flexion 5/5 5/5  Elbow extension 5/5 5/5  Wrist flexion    Wrist extension    Wrist ulnar deviation    Wrist radial deviation    Wrist pronation    Wrist supination    (Blank rows = not tested)   COORDINATION: 9 Hole Peg test: Right: 22.5 sec; Left: 24.88 sec Box and  Blocks:  Right 56 blocks, Left 57 blocks  SENSATION: WFL  COGNITION: Overall cognitive status: Within functional limits for tasks assessed  VISION: Subjective report: no changes; reports having an eye appt when she was in the hospital so will need to reschedule Baseline vision: Wears glasses all the time Visual history: cataracts  VISION ASSESSMENT: Not tested   OBSERVATIONS: Pleasant pt reporting that she is nearly back to baseline from an ADL and IADL standpoint, just recognizing that she is somewhat slower than before the stroke.  At this time pt is most concerned about her back and R knee pain impacting her mobility.                                                                                                                               TREATMENT DATE:  09/13/23 Engaged in discussion about role and purpose of OT as well as potential benefit from aquatic therapy for whole body.  Pt with improvements in RUE ROM and strength compared to IP Rehab and wanting to focus on back and knee pain and mobility.  Pt feels that she is nearly back to baseline from an ADL and IADL standpoint, just limited due to pain.     PATIENT EDUCATION: Education details: Educated on role and purpose of OT as well as potential interventions and goals for therapy based on initial evaluation findings. Person educated: Patient Education method: Explanation Education comprehension: verbalized understanding  HOME EXERCISE PROGRAM: NA   GOALS: Goals reviewed with patient? Yes  SHORT TERM GOALS: Target date: 09/13/23  Pt will verbalize understanding of potential benefit of aquatic therapy for whole body mobility and strengthening. Baseline: Goal status: MET   ASSESSMENT:  CLINICAL IMPRESSION: Patient is a 73 y.o. female who was seen today for occupational therapy evaluation for impairments s/p CVA. Pt reports Mod I to independent with ADLs and IADLs and that back and R knee pain are her most  limiting factors at this point. Pt feels  that she is nearly back to baseline and pleased with continued increased strength and tolerance since d/c home.  Pt expressing desire to focus on back and knee pain relief and increased mobility at this time.  Pt with no OT needs presently, reports understanding of need for referral in the future if other OT needs arise.    PERFORMANCE DEFICITS: in functional skills including IADLs, strength, pain, and endurance and psychosocial skills including routines and behaviors.   IMPAIRMENTS: are limiting patient from IADLs and work.   CO-MORBIDITIES: may have co-morbidities  that affects occupational performance. Patient will benefit from skilled OT to address above impairments and improve overall function.  MODIFICATION OR ASSISTANCE TO COMPLETE EVALUATION: No modification of tasks or assist necessary to complete an evaluation.  OT OCCUPATIONAL PROFILE AND HISTORY: Detailed assessment: Review of records and additional review of physical, cognitive, psychosocial history related to current functional performance.  CLINICAL DECISION MAKING: LOW - limited treatment options, no task modification necessary  REHAB POTENTIAL: Excellent  EVALUATION COMPLEXITY: Low    PLAN:  OT FREQUENCY: one time visit  OT DURATION: other: one time visit  PLANNED INTERVENTIONS: 97168 OT Re-evaluation, 97535 self care/ADL training, energy conservation, coping strategies training, and patient/family education  RECOMMENDED OTHER SERVICES: NA  CONSULTED AND AGREED WITH PLAN OF CARE: Patient  PLAN FOR NEXT SESSION: NA, eval only   Terralyn Matsumura, OTR/L 09/13/2023, 9:56 AM  [facility] [address], [address] [place], [place] [address] Phone # [phone] Fax # [phone]

## 2023-09-13 NOTE — Therapy (Signed)
 OUTPATIENT PHYSICAL THERAPY NEURO EVALUATION   Patient Name: Amy Hooper MRN: [id number] DOB:1950-08-30, 73 y.o., female Today's Date: 09/13/2023   PCP: Oris Camie BRAVO, NP REFERRING PROVIDER: Lorilee Sven SQUIBB, MD  END OF SESSION:  PT End of Session - 09/13/23 0949     Visit Number 1    Number of Visits 13    Date for PT Re-Evaluation 11/08/23    Authorization Type Medicare    Progress Note Due on Visit 10    PT Start Time 0845    PT Stop Time 0930    PT Time Calculation (min) 45 min          Past Medical History:  Diagnosis Date   Allergy 1976   Tramadol  NSAIDS   Chronic kidney disease    Depression    Fibromyalgia    Gout    HTN (hypertension)    Hyperlipidemia 09/01/22   Insomnia    Morbid obesity (HCC)    Non-recurrent acute suppurative otitis media of left ear without spontaneous rupture of tympanic membrane 04/30/2022   Osteoarthritis    Palpitation    Sleep apnea    Past Surgical History:  Procedure Laterality Date   APPLICATION OF WOUND VAC N/A 03/25/2021   Procedure: APPLICATION OF WOUND VAC;  Surgeon: Barbarann Oneil BROCKS, MD;  Location: [facility];  Service: Orthopedics;  Laterality: N/A;   APPLICATION OF WOUND VAC N/A 03/30/2021   Procedure: WOUND VAC CHANGE 12x6x5;  Surgeon: Barbarann Oneil BROCKS, MD;  Location: [facility] OR;  Service: Orthopedics;  Laterality: N/A;   INCISION AND DRAINAGE OF WOUND N/A 03/25/2021   Procedure: LUMBAR POST OP INCISION IRRIGATION;  Surgeon: Barbarann Oneil BROCKS, MD;  Location: [facility];  Service: Orthopedics;  Laterality: N/A;   JOINT REPLACEMENT     KNEE ARTHROSCOPY     LOOP RECORDER INSERTION N/A 08/23/2023   Procedure: LOOP RECORDER INSERTION;  Surgeon: Nancey Eulas BRAVO, MD;  Location: [facility];  Service: Cardiovascular;  Laterality: N/A;   LUMBAR WOUND DEBRIDEMENT N/A 03/30/2021   Procedure: REPEAT LUMBAR WOUND DEBRIDEMENT;  Surgeon: Barbarann Oneil BROCKS, MD;  Location: [facility];  Service: Orthopedics;  Laterality: N/A;   right rotator cuff      ROTATOR CUFF REPAIR Left    TOTAL KNEE ARTHROPLASTY Left 09/08/2020   Procedure: LEFT TOTAL KNEE ARTHROPLASTY;  Surgeon: Jerri Kay HERO, MD;  Location: [facility];  Service: Orthopedics;  Laterality: Left;   TUBAL LIGATION     Patient Active Problem List   Diagnosis Date Noted   Acute ischemic left ACA stroke (HCC) 08/23/2023   CVA (cerebrovascular accident) (HCC) 08/18/2023   Premature atrial complexes 08/18/2023   Obesity (BMI 30-39.9) 08/18/2023   Nocturnal leg cramps 07/27/2023   PAC (premature atrial contraction) 12/20/2022   Forearm tendonitis 07/23/2022   Rash and nonspecific skin eruption 07/23/2022   Asymptomatic bradycardia 07/23/2022   Rheumatoid arthritis with rheumatoid factor of multiple sites without organ or systems involvement (HCC) 04/30/2022   Osteopenia 03/03/2022   Degenerative spondylolisthesis 02/19/2021   Status post total left knee replacement 09/08/2020   OSA (obstructive sleep apnea) 05/19/2020   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/17/2020   Chronic pain syndrome 03/03/2020   S/P lumbar fusion 02/05/2020   Vitamin D  deficiency 01/09/2020   Hyperlipidemia 01/09/2020   CKD (chronic kidney disease) stage 3, GFR 30-59 ml/min (HCC) 01/09/2020   Essential hypertension    Morbid obesity (HCC)    Gout    Fibromyalgia  Depression    Insomnia    Chronically low serum potassium 04/22/2014   Environmental and seasonal allergies 04/22/2014   Palpitations 04/22/2014    ONSET DATE: 08/23/23  REFERRING DIAG:  I63.9 (ICD-10-CM) - Cerebrovascular accident (CVA), unspecified mechanism (HCC)    THERAPY DIAG:  Acute ischemic left ACA stroke (HCC)  Muscle weakness-general  Unsteadiness on feet  Chronic pain of right knee  Other low back pain  Rationale for Evaluation and Treatment: Rehabilitation  SUBJECTIVE:                                                                                                                                                                                              SUBJECTIVE STATEMENT: CVA early June 2025 with hospitalization/inpatient rehab w/ D/C to home at independent/modified independent level.  Has been having back issues before CVA and reports hx of  Right knee and chronic LBP s/p fusion. Reports increased right knee pain and back pain since time in rehab and returning home. Reports she has not sought intervention for current knee/back pain Reports hx of RLE neuropathy, sciatica, and has been accentuated since CVA. Prior to CVA enjoys outdoor activities such as gardening. Now using cane for mobility due to right knee/back pain Pt accompanied by: self  PERTINENT HISTORY: istory of HTN, T2DM, OSA, CKD 3B, fibromyalgia who was admitted on 08/18/2023 with reports of funny feeling oral, progressive RUE weakness at night prior to admission the next day with RUE weakness on awakening. 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 MRI brain showed area of small acute ischemia within posterior paramedian left frontal lobe and multifocal hyperintense T2 weighted signals within white matter. Dr. Jerri felt the stroke was of unclear etiology question cardiac source and loop recorder was placed at discharge Hx of lumbar surgery (fusion?) with infection complications.  PAIN:  Are you having pain? Yes: NPRS scale: 6 (back); 7 (R knee) Pain location: central lumbar; right knee medial compartment Pain description: sharp, burn, pain Aggravating factors: standing, upright Relieving factors: rest,   PRECAUTIONS: Fall  RED FLAGS: None   WEIGHT BEARING RESTRICTIONS: No  FALLS: Has patient fallen in last 6 months? No  LIVING ENVIRONMENT: Lives with:  Lives in: House/apartment Stairs: 7 steps to enter, ground floor Has following equipment at home: Single point cane  PLOF: Independent, Independent with basic ADLs, and Independent with household mobility without device  PATIENT  GOALS:   OBJECTIVE:  Note: Objective measures were completed at Evaluation unless otherwise noted.  DIAGNOSTIC FINDINGS:   COGNITION: Overall cognitive status: Within functional limits for tasks assessed  SENSATION: Numbness along right lateral foot (since back surgery)  COORDINATION:  WNL  MUSCLE TONE: WNL      POSTURE: increased lumbar lordosis  LOWER EXTREMITY ROM:     Active  Right Eval Left Eval  Hip flexion 100+ 100+  Hip extension    Hip abduction    Hip adduction    Hip internal rotation    Hip external rotation    Knee flexion 115 115  Knee extension -5 0  Ankle dorsiflexion 7 14  Ankle plantarflexion    Ankle inversion    Ankle eversion     (Blank rows = not tested)  LOWER EXTREMITY MMT:  resisted tests in sitting  MMT Right Eval Left Eval  Hip flexion 4 5  Hip extension    Hip abduction 4 4  Hip adduction    Hip internal rotation    Hip external rotation    Knee flexion 4 5  Knee extension 4 5  Ankle dorsiflexion 3+ 4  Ankle plantarflexion 2+ 3-  Ankle inversion    Ankle eversion    (Blank rows = not tested)  Trunk flexion: 2+/5 Trunk extension: NT Single leg stance LLE: 10+ sec; RLE: 5-8 sec  BED MOBILITY:  indep  TRANSFERS: Sit-stand and chair-chair independent Floor to chair/stand: DNT   CURB:  Findings: modified indep  STAIRS: Findings: Comments: modified indep step-to pattern GAIT: Findings: Distance walked:   and Comments: mildly antalgic RLE Independent-mod I level surfaces; CGA uneven FUNCTIONAL TESTS:  5 times sit to stand: 27 sec 2 minute walk test:  10 meter walk test: 15.4 sec = 2.12 ft/sec  M-CTSIB  Condition 1: Firm Surface, EO 30 Sec, Normal Sway  Condition 2: Firm Surface, EC 30 Sec, Normal Sway  Condition 3: Foam Surface, EO  Sec,  Sway  Condition 4: Foam Surface, EC  Sec,  Sway    Dynamic Gait Index: Dynamic Gait Index  Mark the lowest level that applies.   Date Performed 6/24  Gait level  surface (2) Mild Impairment: Walks 20', uses AD, slower speed, mild gait deviations  2. Change in gait speed (3) Normal: Able to smoothly change walking speed without loss of balance or gait deviation. Shows a significant difference in walking speeds between normal, fast and slow speeds  3. Gait with horizontal head turns (2) Mild Impairment: Performs head turns smoothly with slight change in gait velocity, i.e., minor disruption to smooth gait path or uses walking aid  4. Gait with vertical head turns (2) Mild Impairment: Performs head turns smoothly with slight change in gait velocity, i.e., minor disruption to smooth gait path or uses walking aid  5. Gait and pivot turn (2) Mild Impairment: Pivot turns safely in > 3 seconds and stops with no loss of balance  6. Step over obstacle (2) Mild Impairment: Is able to step over box, but must slow down and adjust steps to clear box safely  7. Step around obstacle (3) Normal: Is able to walk around cones safely without changing gait speed; no evidence of imbalance  8. Steps (1) Moderate Impairment: Two feet to a stair, must use rail  Total score 17/24    Score Interpretation: Score of <19 indicates high risk of falls.  Minimally Clinically Important Difference (MCID):  =DGI scores of<21/24 = 1.80 points DGI scores of >21/24 = 0.60 points   Suffolk T, Inbar-Borovsky N, Brozgol M, Giladi N, [place] JM. The Dynamic Gait Index in healthy older adults: the role of stair  climbing, fear of falling and gender. Gait Posture. 2009 Feb;29(2):237-41. doi: 10.1016/j.gaitpost.2008.08.013. Epub 2008 Oct 8. PMID: 81154560; PMCID: EFR7290501.  Pardasaney, MYRTIS LOIS Bonus, GEANNIE POUR., et al. (2012). Sensitivity to change and responsiveness of four balance measures for community-dwelling older adults. Physical therapy 92(3): 388-397.    [facility] PT Assessment - 09/13/23 0001       Standardized Balance Assessment   Standardized Balance Assessment Berg Balance Test       Berg Balance Test   Sit to Stand Able to stand without using hands and stabilize independently    Standing Unsupported Able to stand safely 2 minutes    Sitting with Back Unsupported but Feet Supported on Floor or Stool Able to sit safely and securely 2 minutes    Stand to Sit Sits safely with minimal use of hands    Transfers Able to transfer safely, minor use of hands    Standing Unsupported with Eyes Closed Able to stand 10 seconds safely    Standing Unsupported with Feet Together Able to place feet together independently and stand 1 minute safely    From Standing, Reach Forward with Outstretched Arm Can reach confidently >25 cm (10)    From Standing Position, Pick up Object from Floor Able to pick up shoe safely and easily    From Standing Position, Turn to Look Behind Over each Shoulder Looks behind from both sides and weight shifts well    Turn 360 Degrees Able to turn 360 degrees safely but slowly    Standing Unsupported, Alternately Place Feet on Step/Stool Able to stand independently and safely and complete 8 steps in 20 seconds    Standing Unsupported, One Foot in Front Able to place foot tandem independently and hold 30 seconds    Standing on One Leg Able to lift leg independently and hold 5-10 seconds    Total Score 53                                                                                                                                        TREATMENT DATE: 09/13/23    PATIENT EDUCATION: Education details: assessment details, rationale of PT intervention, benefit of aquatic PT, stroke risk factors: modifiable vs non-modifiable Person educated: Patient Education method: Explanation and Handouts Education comprehension: verbalized understanding  HOME EXERCISE PROGRAM: Access Code: [facility] URL: [email or web address] Date: 09/13/2023 Prepared by: Kelly Ilyas Lipsitz  Exercises - Supine Posterior Pelvic Tilt  - 2-3 x weekly - 3 sets - 10 reps -  Hooklying Isometric Hip Flexion  - 1 x daily - 2-3 x weekly - 3 sets - 10 reps - Supine Quad Set  - 1 x daily - 2-3 x weekly - 3 sets - 10 reps  GOALS: Goals reviewed with patient? Yes  SHORT TERM GOALS: Target date: 10/11/2023    Patient will be independent in HEP to improve functional outcomes Baseline: Goal status: INITIAL  2.  Demo improved BLE strength and balance per time < 20 sec 5xSTS test Baseline: 27 sec Goal status: INITIAL  3.  Independent ambulation uneven ground to return to gardening activities Baseline: CGA Goal status: INITIAL    LONG TERM GOALS: Target date: 11/08/2023    Independent w/ advanced HEP to include land and aquatic-based elements to prepare for D/C Baseline:  Goal status: INITIAL  2.  Modified independent floor/ground to stand to improve mobility and safety with gardening activities Baseline: NT Goal status: INITIAL  3.  Demo low risk for falls per score 20/24 Dynamic Gait Index to improve safety with mobility Baseline:  Goal status: INITIAL  4.  Improve gait speed to 2.8 ft/sec to improve efficiency community ambulation Baseline: 2.12 no AD Goal status: INITIAL  5.  Improve RLE strength to 5/5 for improved single limb support/stability Baseline: 4/5 Goal status: INITIAL  6.  Back/right knee pain not to exceed 3/10 with exercise/functional mobility/activities Baseline: 6/10 Goal status: INITIAL  ASSESSMENT:  CLINICAL IMPRESSION: Patient is a 73 y.o. lady who was seen today for physical therapy evaluation and treatment for hx of CVA.  Thankfully, her deficits and limitations from CVA are not apparent although she does exhibit generalized weakness affecting RLE > LLE but notes acute on chronic issues of back and right knee pain w/ hx of sciatica affecting RLE with sensory disturbance to lateral foot as pre-morbid condition.  Exhibits LE weakness and increased risk for falls per time on 5xSTS and score DGI 17/24.  Normal Romberg test and  low risk for static balance per Lars Balance Test.  Pt would benefit from ongoing PT services to address back and RLE pain/weakness and postural/body mechanics to improve functional activity tolerance and return to typical activity levels.   Patient would benefit from aquatic-based PT intervention. This approach allows for a whole-body focus, with special attention on activating and relearning proper biomechanics in injured areas. Additionally, water  as a modality can both strengthen and facilitate the whole body; water 's unique properties can reduce impact of exercise on joints, increase pain-free movement, improve microcirculation, and help increase muscle tone through natural resistance to improve this patient's functional mobility  OBJECTIVE IMPAIRMENTS: Abnormal gait, decreased activity tolerance, decreased balance, decreased mobility, difficulty walking, decreased strength, improper body mechanics, and pain.   ACTIVITY LIMITATIONS: carrying, lifting, bending, stairs, transfers, and locomotion level  PARTICIPATION LIMITATIONS: meal prep, cleaning, laundry, community activity, occupation, and yard work  PERSONAL FACTORS: Age, Time since onset of injury/illness/exacerbation, and 1-2 comorbidities: PMH are also affecting patient's functional outcome.   REHAB POTENTIAL: Excellent  CLINICAL DECISION MAKING: Evolving/moderate complexity  EVALUATION COMPLEXITY: Moderate  PLAN:  PT FREQUENCY: 1-2x/week  PT DURATION: 8 weeks  PLANNED INTERVENTIONS: 97750- Physical Performance Testing, 97110-Therapeutic exercises, 97530- Therapeutic activity, [id number]- Neuromuscular re-education, 97535- Self Care, [id number]- Manual therapy, [id number]- Gait training, [phone]- Aquatic Therapy, [phone]- Electrical stimulation (unattended), and 20560 (1-2 muscles), 20561 (3+ muscles)- Dry Needling  PLAN FOR NEXT SESSION: HEP review/progressions, balance+strength activities, floor to stand/sit   10:08 AM, 09/13/23 M. Kelly  Arieon Corcoran, PT, DPT Physical Therapist-  Office Number: [phone]

## 2023-09-14 ENCOUNTER — Other Ambulatory Visit: Payer: Self-pay | Admitting: Nurse Practitioner

## 2023-09-14 ENCOUNTER — Encounter: Payer: Self-pay | Admitting: Internal Medicine

## 2023-09-14 ENCOUNTER — Encounter: Payer: Self-pay | Admitting: Physical Medicine and Rehabilitation

## 2023-09-14 DIAGNOSIS — N1832 Chronic kidney disease, stage 3b: Secondary | ICD-10-CM

## 2023-09-14 DIAGNOSIS — I10 Essential (primary) hypertension: Secondary | ICD-10-CM

## 2023-09-14 DIAGNOSIS — E782 Mixed hyperlipidemia: Secondary | ICD-10-CM

## 2023-09-15 NOTE — Patient Instructions (Signed)
 Below is our plan:  Stroke: left frontal lobe ACA territory infarct with left A3/A4 occlusion, etiology: Unclear, cardiogenic: Residual deficit: none. Continue aspirin  81 mg daily and rosuvastatin  for secondary stroke prevention. Discussed secondary stroke prevention measures and importance of close PCP follow up for aggressive stroke risk factor management. I have gone over the pathophysiology of stroke, warning signs and symptoms, risk factors and their management in some detail with instructions to go to the closest emergency room for symptoms of concern. HTN: BP goal <130/90. Elevated, today. Usually stable on Cozaar . Continue per PCP. Advised to discuss need for Inderall with PCP and Dr Lorilee as BP seems to be higher over the past week. Advised to monitor daily at home for the next 2 weeks.  HLD: LDL goal <70. Recent LDL 136. Unable to tolerate rosuvastatin  d/t severe leg cramps. Repatha discussed with Dr Lorilee and PCP. Advised to continue discussion with PCP.  DMII: A1c goal<7.0. Recent A1c 5. Not Diabetic. Continue to monitor. Healthy lifestyle habits with regular exercise and well balanced diet advised.  Obesity: Continue close follow up with PCP. Healthy lifestyle habits with regular exercise and well balanced diet advised.  Sleep apnea: Continue CPAP therapy nightly for at least 4 hours. Most recent download shows acceptable (83% daily and 74% four hour) compliance. AHI 5.5. Will recheck compliance data in 6-8 weeks. Will update supply orders, today.  CKD: continue Farxiga  for renal protection. Continue close follow up with care team.   Goals:  1) Maintain strict control of hypertension with blood pressure goal below 130/90 2) Maintain good control of diabetes with hemoglobin A1c goal below 7%  3) Maintain good control of lipids with LDL cholesterol goal below 70 mg/dL.  4) Eat a healthy diet with plenty of whole grains, cereals, fruits and vegetables, exercise regularly and maintain  ideal body weight   Resources: [email or web address].[email or web address].biz/  Please continue using your CPAP regularly. While your insurance requires that you use CPAP at least 4 hours each night on 70% of the nights, I recommend, that you not skip any nights and use it throughout the night if you can. Getting used to CPAP and staying with the treatment long term does take time and patience and discipline. Untreated obstructive sleep apnea when it is moderate to severe can have an adverse impact on cardiovascular health and raise her risk for heart disease, arrhythmias, hypertension, congestive heart failure, stroke and diabetes. Untreated obstructive sleep apnea causes sleep disruption, nonrestorative sleep, and sleep deprivation. This can have an impact on your day to day functioning and cause daytime sleepiness and impairment of cognitive function, memory loss, mood disturbance, and problems focussing. Using CPAP regularly can improve these symptoms.  We will update supply orders, today.   Please make sure you are staying well hydrated. I recommend 50-60 ounces daily. Well balanced diet and regular exercise encouraged. Consistent sleep schedule with 6-8 hours recommended.   Please continue follow up with care team as directed.   Follow up with me in 1 year  You may receive a survey regarding today's visit. I encourage you to leave honest feed back as I do use this information to improve patient care. Thank you for seeing me today!

## 2023-09-15 NOTE — Progress Notes (Unsigned)
 [facility] [address] [place]. [place] [address] [phone]       HOSPITAL FOLLOW UP NOTE  Ms. Amy Hooper Date of Birth: Dec 27, 1950 Medical Record Number: [id number]   Reason for Referral:  hospital stroke follow up    SUBJECTIVE:   CHIEF COMPLAINT:  No chief complaint on file.   HPI:   Amy Hooper is a 73 y.o. who  has a past medical history of Allergy (1976), Chronic kidney disease, Depression, Fibromyalgia, Gout, HTN (hypertension), Hyperlipidemia (09/01/22), Insomnia, Morbid obesity (HCC), Non-recurrent acute suppurative otitis media of left ear without spontaneous rupture of tympanic membrane (04/30/2022), Osteoarthritis, Palpitation, and Sleep apnea.  Patient presented on 08/18/2023 with progressive RLE weakness the night prior with RUE weakness upon waking. 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 Unclear etiology, question cardiac source, and loop recorder placed 08/23/2023. Patient had stopped taking her ASA due to GER. DAPT X 3 weeks followed by ASA alone. LDL 136. Atorvastatin  previously failed due to leg pain but she agreed to try rosuvastatin . CIR recommended and she was admitted 08/23/2023. Discharged home 08/27/2023. Personally reviewed hospitalization pertinent progress notes, lab work and imaging.  Evaluated by Dr Jerri.   Since discharge,    PERTINENT IMAGING/LABS  CTA head & neck 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 There is reconstitution of the left A4 segment over the anterior body of the corpus callosum with diminutive caliber. Intracranial arterial vasculature is otherwise patent. Mild atherosclerosis as above.  No high-grade stenosis. MRI  Small region of acute ischemia  within the posterior paramedian left frontal lobe. No hemorrhage or mass effect. 2D Echo EF 55 to 70% Venous duplex no DVT Loop recorder placed 08/23/2023   A1C Lab Results  Component Value Date   HGBA1C 5.0 08/18/2023    Lipid Panel     Component Value Date/Time   CHOL 226 (H) 08/18/2023 2251   CHOL 239 (H) 04/25/2023 0926   TRIG 62 08/18/2023 2251   HDL 78 08/18/2023 2251   HDL 71 04/25/2023 0926   CHOLHDL 2.9 08/18/2023 2251   VLDL 12 08/18/2023 2251   LDLCALC 136 (H) 08/18/2023 2251   LDLCALC 155 (H) 04/25/2023 0926   LDLCALC 145 (H) 01/08/2020 0826   LABVLDL 13 04/25/2023 0926      ROS:   14 system review of systems performed and negative with exception of those listed in HPI  PMH:  Past Medical History:  Diagnosis Date   Allergy 1976   Tramadol  NSAIDS   Chronic kidney disease    Depression    Fibromyalgia    Gout    HTN (hypertension)    Hyperlipidemia 09/01/22   Insomnia    Morbid obesity (HCC)    Non-recurrent acute suppurative otitis media of left ear without spontaneous rupture of tympanic membrane 04/30/2022   Osteoarthritis    Palpitation    Sleep apnea     PSH:  Past Surgical History:  Procedure Laterality Date   APPLICATION OF WOUND VAC N/A 03/25/2021   Procedure: APPLICATION OF WOUND VAC;  Surgeon: Barbarann Oneil BROCKS, MD;  Location: [facility] OR;  Service: Orthopedics;  Laterality: N/A;   APPLICATION OF WOUND VAC N/A 03/30/2021   Procedure: WOUND VAC CHANGE 12x6x5;  Surgeon: Barbarann Oneil BROCKS,  MD;  Location: [facility] OR;  Service: Orthopedics;  Laterality: N/A;   INCISION AND DRAINAGE OF WOUND N/A 03/25/2021   Procedure: LUMBAR POST OP INCISION IRRIGATION;  Surgeon: Barbarann Oneil BROCKS, MD;  Location: [facility] OR;  Service: Orthopedics;  Laterality: N/A;   JOINT REPLACEMENT     KNEE ARTHROSCOPY     LOOP RECORDER INSERTION N/A 08/23/2023   Procedure: LOOP RECORDER INSERTION;  Surgeon: Nancey Eulas BRAVO, MD;  Location: [facility];  Service: Cardiovascular;  Laterality: N/A;    LUMBAR WOUND DEBRIDEMENT N/A 03/30/2021   Procedure: REPEAT LUMBAR WOUND DEBRIDEMENT;  Surgeon: Barbarann Oneil BROCKS, MD;  Location: [facility] OR;  Service: Orthopedics;  Laterality: N/A;   right rotator cuff     ROTATOR CUFF REPAIR Left    TOTAL KNEE ARTHROPLASTY Left 09/08/2020   Procedure: LEFT TOTAL KNEE ARTHROPLASTY;  Surgeon: Jerri Kay HERO, MD;  Location: [facility] OR;  Service: Orthopedics;  Laterality: Left;   TUBAL LIGATION      Social History:  Social History   Socioeconomic History   Marital status: Married    Spouse name: Not on file   Number of children: 3   Years of education: Not on file   Highest education level: Bachelor's degree (e.g., BA, AB, BS)  Occupational History   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     Social Drivers of Corporate investment banker Strain: Low Risk  (07/25/2023)   Overall Financial Resource Strain (CARDIA)    Difficulty of Paying Living Expenses: Not hard at all  Food Insecurity: No Food Insecurity (08/18/2023)   Hunger Vital Sign    Worried About Running Out of Food in the Last Year: Never true    Ran Out of Food in the Last Year: Never true  Transportation Needs: No Transportation Needs (08/18/2023)   PRAPARE - Administrator, Civil Service (Medical): No    Lack of Transportation (Non-Medical): No  Physical Activity: Inactive (07/25/2023)   Exercise Vital Sign    Days of Exercise per Week: 0 days    Minutes of Exercise per Session: 20 min  Stress: Stress Concern Present (07/25/2023)   [employer]  of Occupational Health - Occupational Stress Questionnaire    Feeling of Stress : Rather much  Social Connections: Moderately Integrated (08/18/2023)   Social Connection and Isolation Panel    Frequency of Communication with Friends and Family: Three times a week    Frequency of Social Gatherings with Friends and Family: Twice a week    Attends Religious Services: Never    Database administrator or Organizations: Yes    Attends Banker Meetings: Never    Marital Status: Married  Recent Concern: Social Connections - Moderately Isolated (07/25/2023)   Social Connection and Isolation Panel    Frequency of Communication with Friends and Family: Three times a week    Frequency of Social Gatherings with Friends and Family: Once a week  Attends Religious Services: Never    Active Member of Clubs or Organizations: No    Attends Banker Meetings: Never    Marital Status: Married  Catering manager Violence: Not At Risk (08/18/2023)   Humiliation, Afraid, Rape, and Kick questionnaire    Fear of Current or Ex-Partner: No    Emotionally Abused: No    Physically Abused: No    Sexually Abused: No    Family History:  Family History  Problem Relation Age of Onset   Hypothyroidism Mother    Hyperlipidemia Mother    Miscarriages / [place] Mother    Hyperlipidemia Other    Kidney failure Father    Dementia Father    Gout Father    Arthritis Father    Prostate cancer Father    Heart disease Father    Hypertension Father    Kidney disease Father    Stroke Father     Medications:   Current Outpatient Medications on File Prior to Visit  Medication Sig Dispense Refill   allopurinol  (ZYLOPRIM ) 100 MG tablet Take 2 tablets (200 mg total) by mouth daily. 180 tablet 3   aspirin  EC 81 MG tablet Take 1 tablet (81 mg total) by mouth daily. Swallow whole. 30 tablet 12   clopidogrel  (PLAVIX ) 75 MG tablet Take 1 tablet (75 mg total) by mouth daily. 13 tablet 0    cyclobenzaprine  (FLEXERIL ) 5 MG tablet Take 1 tablet (5 mg total) by mouth at bedtime. 30 tablet 0   dapagliflozin  propanediol (FARXIGA ) 5 MG TABS tablet Take 1 tablet (5 mg total) by mouth daily. 90 tablet 1   DULoxetine  (CYMBALTA ) 20 MG capsule TAKE 1 CAPSULE BY MOUTH DAILY 90 capsule 3   eszopiclone  (LUNESTA ) 1 MG TABS tablet Take 1 tablet (1 mg total) by mouth at bedtime as needed for sleep. Take immediately before bedtime (Patient taking differently: Take 1 mg by mouth at bedtime.) 90 tablet 1   fluticasone  (FLONASE ) 50 MCG/ACT nasal spray Place 1 spray into both nostrils daily as needed for allergies.     HYDROcodone -acetaminophen  (NORCO) 7.5-325 MG tablet Take 1 tablet by mouth 3 (three) times daily as needed for moderate pain (pain score 4-6). 90 tablet 0   hydrocortisone  (ANUSOL -HC) 2.5 % rectal cream PLACE 1 APPLICATION RECTALLY 2 (TWO) TIMES DAILY. 30 g 3   ketoconazole  (NIZORAL ) 2 % cream Apply 1 Application topically daily. Apply to rash twice a day. If no improvement after 2 weeks, please let provider know. 60 g 0   loratadine  (CLARITIN ) 10 MG tablet Take 10 mg by mouth daily as needed for allergies.     losartan  (COZAAR ) 50 MG tablet Take 1 tablet (50 mg total) by mouth daily. 90 tablet 3   mometasone  (ELOCON ) 0.1 % cream Apply very thin layer to rash at bedtime. May use one other time during the day if needed. Stop after 7 days, if still needed may repeat after 3 day break. 45 g 1   polyethylene glycol (MIRALAX  / GLYCOLAX ) 17 g packet Take 17 g by mouth daily. 14 each 0   pregabalin  (LYRICA ) 75 MG capsule Take 1 capsule (75 mg total) by mouth 2 (two) times daily. 180 capsule 1   propranolol  (INDERAL ) 10 MG tablet Take 1 tablet (10 mg total) by mouth daily. 30 tablet 0   rosuvastatin  (CRESTOR ) 20 MG tablet Take 1 tablet (20 mg total) by mouth daily. 30 tablet 3   senna-docusate (SENOKOT-S) 8.6-50 MG tablet Take 2 tablets by mouth  2 (two) times daily. 120 tablet 0   Vitamin D ,  Ergocalciferol , (DRISDOL ) 1.25 MG (50000 UNIT) CAPS capsule Take 1 capsule (50,000 Units total) by mouth every 7 (seven) days. 12 capsule 1   Current Facility-Administered Medications on File Prior to Visit  Medication Dose Route Frequency Provider Last Rate Last Admin   capsaicin  topical system 8 % patch 1 patch  1 patch Topical Once Raulkar, Sven SQUIBB, MD        Allergies:   Allergies  Allergen Reactions   Biaxin [Clarithromycin] Hives and Rash   Ceftin [Cefuroxime] Hives and Rash   Nsaids Nausea And Vomiting   Lortab [Hydrocodone -Acetaminophen ] Other (See Comments)    Stomach pain   Percocet [Oxycodone -Acetaminophen ] Other (See Comments)    Hallucination   Robaxin  [Methocarbamol ] Nausea And Vomiting and Other (See Comments)    Stomach pain   Toradol  [Ketorolac  Tromethamine ] Nausea And Vomiting   Ultram  [Tramadol ] Nausea And Vomiting and Other (See Comments)    Stomach pain      OBJECTIVE:  Physical Exam  There were no vitals filed for this visit. There is no height or weight on file to calculate BMI. No results found.     06/20/2023    2:43 PM  Depression screen PHQ 2/9  Decreased Interest 0  Down, Depressed, Hopeless 0  PHQ - 2 Score 0     General: well developed, well nourished, seated, in no evident distress Head: head normocephalic and atraumatic.   Neck: supple with no carotid or supraclavicular bruits Cardiovascular: regular rate and rhythm, no murmurs Musculoskeletal: no deformity Skin:  no rash/petichiae Vascular:  Normal pulses all extremities   Neurologic Exam Mental Status: Awake and fully alert.  Fluent speech and language.  Oriented to place and time. Recent and remote memory intact. Attention span, concentration and fund of knowledge appropriate. Mood and affect appropriate.  Cranial Nerves: Fundoscopic exam reveals sharp disc margins. Pupils equal, briskly reactive to light. Extraocular movements full without nystagmus. Visual fields full to  confrontation. Hearing intact. Facial sensation intact. Face, tongue, palate moves normally and symmetrically.  Motor: Normal bulk and tone. Normal strength in all tested extremity muscles Sensory.: intact to touch , pinprick , position and vibratory sensation.  Coordination: Rapid alternating movements normal in all extremities. Finger-to-nose and heel-to-shin performed accurately bilaterally. Gait and Station: Arises from chair without difficulty. Stance is normal. Gait demonstrates normal stride length and balance with ***. Tandem walk and heel toe ***.  Reflexes: 1+ and symmetric.    NIHSS  *** Modified Rankin  ***    ASSESSMENT: Amy Hooper is a 73 y.o. year old female presenting 08/18/2023 with progressive RLE weakness the night prior with RUE weakness upon waking.. Vascular risk factors include HTN, HLD, OSA, obesity.      PLAN:  Stroke: left frontal lobe ACA territory infarct with left A3/A4 occlusion, etiology: Unclear, cardiogenic: Residual deficit: ***. Continue aspirin  81 mg daily and rosuvastatin  for secondary stroke prevention. Discussed secondary stroke prevention measures and importance of close PCP follow up for aggressive stroke risk factor management. I have gone over the pathophysiology of stroke, warning signs and symptoms, risk factors and their management in some detail with instructions to go to the closest emergency room for symptoms of concern. HTN: BP goal <130/90.  Stable on *** per PCP HLD: LDL goal <70. Recent LDL 136. Continue rosuvastatin  20mg  daily per PCP. Consider Repatha if unable to tolerate statin.  DMII: A1c goal<7.0. Recent A1c 5. Not Diabetic. Continue  to monitor. Healthy lifestyle habits with regular exercise and well balanced diet advised.  Obesity: Continue close follow up with PCP. Healthy lifestyle habits with regular exercise and well balanced diet advised.  Sleep apnea: Continue CPAP therapy nightly for at least 4 hours. Will update  supply orders, today.    Follow up in *** or call earlier if needed   CC:  [facility] provider: Dr. Rosemarie PCP: Early, Camie BRAVO, NP    I spent *** minutes of face-to-face and non-face-to-face time with patient.  This included previsit chart review including review of recent hospitalization, lab review, study review, order entry, electronic health record documentation, patient education regarding recent stroke including etiology, secondary stroke prevention measures and importance of managing stroke risk factors, residual deficits and typical recovery time and answered all other questions to patient satisfaction   Greig Forbes, [facility]  [facility] [address] [place], [place] [address]  Phone [phone] Fax [phone] Note: This document was prepared with digital dictation and possible smart phrase technology. Any transcriptional errors that result from this process are unintentional.

## 2023-09-19 ENCOUNTER — Encounter: Attending: Registered Nurse | Admitting: Physical Medicine and Rehabilitation

## 2023-09-19 ENCOUNTER — Encounter: Payer: Self-pay | Admitting: Physical Medicine and Rehabilitation

## 2023-09-19 ENCOUNTER — Telehealth: Payer: Self-pay | Admitting: Orthopaedic Surgery

## 2023-09-19 VITALS — BP 125/77 | HR 49 | Ht 62.0 in | Wt 204.0 lb

## 2023-09-19 DIAGNOSIS — Z8619 Personal history of other infectious and parasitic diseases: Secondary | ICD-10-CM | POA: Diagnosis present

## 2023-09-19 DIAGNOSIS — M545 Low back pain, unspecified: Secondary | ICD-10-CM | POA: Insufficient documentation

## 2023-09-19 DIAGNOSIS — G8929 Other chronic pain: Secondary | ICD-10-CM | POA: Insufficient documentation

## 2023-09-19 MED ORDER — HYDROCODONE-ACETAMINOPHEN 7.5-325 MG PO TABS
1.0000 | ORAL_TABLET | Freq: Three times a day (TID) | ORAL | 0 refills | Status: DC | PRN
Start: 2023-09-19 — End: 2023-12-15

## 2023-09-19 MED ORDER — JOURNAVX 50 MG PO TABS
1.0000 | ORAL_TABLET | Freq: Two times a day (BID) | ORAL | 1 refills | Status: DC | PRN
Start: 2023-09-19 — End: 2023-10-05

## 2023-09-19 NOTE — Patient Instructions (Signed)
 Provided with list of supplements that can help with dyslipidemia: 1) Vitamin B3 500-4,000mg  in divided doses daily (would recommend starting low as can cause uncomfortable facial flushing if started at too high a dose) 2) Phytosterols 2.15 grams daily 3) Fermented soy 30-50 grams daily 4) EGCG (found in green tea): 500-1000mg  daily 5) Omega-3 fatty acids 3000-5,000mg  daily 6) Flax seed 40 grams daily 7) Monounsaturated fats 20-40 grams daily (olives, olive oil, nuts), also reduces cardiovascular disease 8) Sesame: 40 grams daily 9) Gamma/delta tocotrienols- a family of unsaturated forms of Vitamin E- 200mg  with dinner 10) Pantethine 900mg  daily in divided doses 11) Resveratrol 250mg  daily 12) N Acetyl Cysteine 2000mg  daily in divided doses 13) Curcumin 2000-5000mg  in divided doses daily 14) Pomegranate juice: 8 ounces daily, also helps to lower blood pressure 15) Pomegranate seeds one cup daily, also helps to lower blood pressure 16) Citrus Bergamot 1000mg  daily, also helps with glucose control and weight loss 17) Vitamin C 500mg  daily 18) Quercetin 500-1000mg  daily 19) Glutathione 20) Probiotics 60-100 billion organisms per day 21) Fiber 22) Oats 23) Aged garlic (can eat as food or supplement of 600-900mg  per day) 24) Chia seeds 25 grams per day 25) Lycopene- carotenoid found in high concentrations in tomatoes. 26) Alpha linolenic acid 27) Flavonoids and anthocyanins 28) Wogonin- flavanoid that enhances reverse cholesterol transport 29) Coenzyme Q10 30) Pantethine- derivative of Vitamin B5: 300mg  three times per day or 450mg  twice per day with or without food 31) Barley and other whole grains 32) Orange juice 33) L- carnitine 34) L- Lysine 35) L- Arginine 36) Almonds 37) Morin 38) Rutin 39) Carnosine 40) Histidine  41) Kaempferol  42) Organosulfur compounds 43) Vitamin E 44) Oleic acid 45) RBO (ferulic acid gammaoryzanol) 46) grape seed extract 47) Red wine 48)  Berberine HCL 500mg  daily or twice per day- more effective and with fewer adverse effects that ezetimibe monotherapy 49) red yeast rice 2400- 4800 mg/day 50) chlorella 51) Licorice

## 2023-09-19 NOTE — Progress Notes (Signed)
 Subjective:    Patient ID: Amy Hooper, female    DOB: 04-22-1950, 73 y.o.   MRN: [id number]  HPI Female with f/u OSA, morbid obesity, right foot neuropathy, hypertension, fibromyalgia, depression, gout, CKD, degenerative disc disease-lumbar, bilateral knee surgeries for torn mensici, bilateral shoulder surgeries for rotator cuff presents with bilateral L > R knee pain and wound-related pain. She presents for follow-up today regarding her food allergies  1) Back pain -she has been having back pain -she was having pain in her back before the stroke as well -she does not want to go back to Dr. Barbarann.   2) Knee pain: -she had relief from synvisc -she has terrible pain during the night -patches and the gel do not help.  Initially stated: Started ~1990. After a fall on her knees.  Progressively getting worse.  Steroid injections improve the pain along with brace.  Ambulation exacerbates the pain.  Moving after prolonged postures exacerbates the pain.  Achy.  Radiates down leg.  Intermittent. Denies associated weakness, numbness.  Tylenol . 1 fall summer of 2021 falling backward in chair.  Pain limits ambulation.  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 She has had bilateral L4-5 lumbar epidural steroid injections.  She has also had facet injections.  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  Irritation of S1 nerve roots.  She had a left knee steroid injection on 03/18/2020 with Ortho.  She is seen rheumatology as well.  No reviewed from rheumatology, orthopedic surgeon, physiatrist, neurology-plan for knee replacement in December 2022. She does note that she is improving with weight loss and dietary changes.   3) Hand pain -spiked since she has been off Celebrex due to her kidney injury -she was recommended to take Qunidine but she can't take because of the  antibiotics which she is on for a year due to her wound in her back.  -percocet does not help with the pain as much as the hydrocodone .   4) AKI -improved since off Celebrex  5) Depression:  -she feels miserable.  -she was started on Cymbalta . This did help with the pain in her legs.  -she is not sure if she needs to see anyone for grief  6) Insomnia: -not sleeping well.  -she cannot sleep without Lunesta  and is having an issue with this for her insurance -she also takes Amitriptyline , Lyrica , and Cymbalta - when she takes all these she does not get up until 11am the next day.  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  Since last visit, she states she has had great improvement with pool therapy. She states she is hungry. She had good benefits with Lyrica . She has not seen Nephro yet. She is awaiting CPAP.  She has bad fibromyalgia  flares when the weather changes- especially when it rains. She was started on Neurontin by Dr. Tobie and it does help on a daily basis. She had relief with Celebrex well before but had to stop due to CKD. She has never tried steroids.   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   Situational depression: She is going through changes with her family. They are all living in her house and everyone is arguing. She does not want to get herself worked up due to her hypertension. Her family brings her in a lot. Her husband says why  don't you say anything. She felt a car coming across her yesterday and she screamed. She feels overwhelmed. Her mother may have undiagnosed mental illness. Her mom called th police on her husband.   Severe arthritis and bulging discs: -she has a lot of pain and nothing helps it. If she sits too long it hurts and if she walks to long it hurts -Lyrica  and steroids help her pain -she is planing to have surgery.   Weight gain  Not voiding as much as she used to -check Creatinine today.    Pain Inventory Average Pain 9 Pain Right Now 8 My pain is intermittent, burning, dull, and tingling, stabbing  In the last 24 hours, has pain interfered with the following? General activity 8 Relation with others 9 Enjoyment of life 10 What TIME of day is your pain at its worst? morning , daytime, and evening Sleep (in general) Fair  Pain is worse with: walking, bending, sitting, standing, and some activites Pain improves with: heat/ice and medication Relief from Meds: 7   Family History  Problem Relation Age of Onset   Hypothyroidism Mother    Hyperlipidemia Mother    Miscarriages / [place] Mother    Hyperlipidemia Other    Kidney failure Father    Dementia Father    Gout Father    Arthritis Father    Prostate cancer Father    Heart disease Father    Hypertension Father    Kidney disease Father    Stroke Father    Social History   Socioeconomic History   Marital status: Married    Spouse name: Not on file   Number of children: 3   Years of education: Not on file   Highest education level: Bachelor's degree (e.g., BA, AB, BS)  Occupational History   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     Social Drivers of Corporate investment banker Strain: Low Risk  (07/25/2023)   Overall Financial Resource Strain (CARDIA)    Difficulty of Paying Living Expenses: Not hard at all  Food Insecurity: No Food Insecurity (08/18/2023)   Hunger Vital Sign    Worried About Running Out of Food in the Last Year: Never true    Ran Out of Food in the Last Year: Never true  Transportation Needs: No Transportation Needs (08/18/2023)   PRAPARE - Transportation  Lack of Transportation (Medical): No    Lack of Transportation (Non-Medical): No  Physical Activity: Inactive (07/25/2023)   Exercise Vital Sign    Days of Exercise per Week: 0 days    Minutes of Exercise per Session: 20 min  Stress: Stress Concern Present (07/25/2023)   [employer] of Occupational Health - Occupational Stress Questionnaire    Feeling of Stress : Rather much  Social Connections: Moderately Integrated (08/18/2023)   Social Connection and Isolation Panel    Frequency of Communication with Friends and Family: Three times a week    Frequency of Social Gatherings with Friends and Family: Twice a week    Attends Religious Services: Never    Database administrator or Organizations: Yes    Attends Banker Meetings: Never    Marital Status: Married  Recent Concern: Social Connections - Moderately Isolated (07/25/2023)   Social Connection and Isolation Panel    Frequency of Communication with Friends and Family: Three times a week    Frequency of Social Gatherings with Friends and Family: Once a week    Attends Religious Services: Never    Database administrator or Organizations: No    Attends Banker Meetings: Never    Marital Status: Married   Past Surgical History:  Procedure Laterality Date   APPLICATION OF WOUND VAC N/A 03/25/2021    Procedure: APPLICATION OF WOUND VAC;  Surgeon: Barbarann Oneil BROCKS, MD;  Location: [facility];  Service: Orthopedics;  Laterality: N/A;   APPLICATION OF WOUND VAC N/A 03/30/2021   Procedure: WOUND VAC CHANGE 12x6x5;  Surgeon: Barbarann Oneil BROCKS, MD;  Location: [facility];  Service: Orthopedics;  Laterality: N/A;   INCISION AND DRAINAGE OF WOUND N/A 03/25/2021   Procedure: LUMBAR POST OP INCISION IRRIGATION;  Surgeon: Barbarann Oneil BROCKS, MD;  Location: [facility];  Service: Orthopedics;  Laterality: N/A;   JOINT REPLACEMENT     KNEE ARTHROSCOPY     LOOP RECORDER INSERTION N/A 08/23/2023   Procedure: LOOP RECORDER INSERTION;  Surgeon: Nancey Eulas BRAVO, MD;  [facility]: [facility];  Service: Cardiovascular;  Laterality: N/A;   LUMBAR WOUND DEBRIDEMENT N/A 03/30/2021   Procedure: REPEAT LUMBAR WOUND DEBRIDEMENT;  Surgeon: Barbarann Oneil BROCKS, MD;  Location: [facility];  Service: Orthopedics;  Laterality: N/A;   right rotator cuff     ROTATOR CUFF REPAIR Left    TOTAL KNEE ARTHROPLASTY Left 09/08/2020   Procedure: LEFT TOTAL KNEE ARTHROPLASTY;  Surgeon: Jerri Kay HERO, MD;  Location: [facility];  Service: Orthopedics;  Laterality: Left;   TUBAL LIGATION     Past Medical History:  Diagnosis Date   Allergy 1976   Tramadol  NSAIDS   Chronic kidney disease    Depression    Fibromyalgia    Gout    HTN (hypertension)    Hyperlipidemia 09/01/22   Insomnia    Morbid obesity (HCC)    Non-recurrent acute suppurative otitis media of left ear without spontaneous rupture of tympanic membrane 04/30/2022   Osteoarthritis    Palpitation    Sleep apnea    There were no vitals taken for this visit.  Opioid Risk Score:   Fall Risk Score:  `1  Depression screen [facility] 2/9     06/20/2023    2:43 PM 04/25/2023    8:16 AM 01/12/2023    8:21 AM 12/13/2022    1:47 PM 11/15/2022   11:12 AM 11/05/2022    1:10 PM 10/14/2022    9:59 AM  Depression screen PHQ 2/9  Decreased Interest 0 1 0 0 0 0 0  Down, Depressed, Hopeless 0 3 0 0 0 0 0  PHQ - 2 Score 0 4 0  0 0 0 0  Altered sleeping  3       Tired, decreased energy  3       Change in appetite  1       Feeling bad or failure about yourself   1       Trouble concentrating  1       Moving slowly or fidgety/restless  0       Suicidal thoughts  0       PHQ-9 Score  13       Difficult doing work/chores  Somewhat difficult        Review of Systems  Constitutional: Negative.   HENT: Negative.    Eyes: Negative.   Respiratory: Negative.    Cardiovascular: Negative.   Gastrointestinal: Negative.   Endocrine: Negative.   Genitourinary: Negative.   Musculoskeletal:  Positive for arthralgias, back pain and gait problem. Negative for joint swelling, myalgias, neck pain and neck stiffness.       Pain in both legs  Allergic/Immunologic: Negative.   Hematological: Negative.   All other systems reviewed and are negative.     Objective:   Physical Exam  PRIOR EXAM: Gen: no distress, normal appearing, weight 200 lbs, BMI 34.57, BP 135/77 HEENT: oral mucosa pink and moist, NCAT Cardio: Reg rate Chest: normal effort, normal rate of breathing Abd: soft, non-distended Ext: no edema Psych: pleasant, normal affect Skin: no open lesions on right foot Neuro: Alert and oriented x3    Assessment & Plan:  Female with past medical history/past surgical history of OSA, morbid obesity, hypertension, fibromyalgia, depression, gout, CKD, degenerative disc disease-lumbar, bilateral knee surgeries for torn mensici, bilateral shoulder surgeries for rotator cuff presents with bilateral L > R knee pain.   1. Bilateral knee OA - endstage  Discussed plan for knee surgery  -discussed that weight loss will help  Patient was supposed to have knee replacements last year, but due to insurance changed to summer 2022  Endstage OA in b/l knee per Ortho, no films available  No benefit with Heat/Cold, PT (~2019), Robaxin , Flexaril, Tizanidine   Continue bracing prn (limit on right knee - educated)  Continue Voltaren  gel  Lidocaine  5% patch ordered  -Provided with a pain relief journal and discussed that it contains foods and lifestyle tips to naturally help to improve pain. Discussed that these lifestyle strategies are also very good for health unlike some medications which can have negative side effects. Discussed that the act of keeping a journal can be therapeutic and helpful to realize patterns what helps to trigger and alleviate pain.    Decreased Cymbalta  to 20mg  daily.   Patient states main goal is to ambulate  Wants to buy a pool - afraid of public locations due to CoVid  Recommended follow up with Nephro regarding Chondroitin sulfate, appointment next month  Tolerating mild exercise - limited by back   Continue antiinflammatory diet -Discussed following foods that may reduce pain: 1) Ginger (especially studied for arthritis)- reduce leukotriene production to decrease inflammation 2) Blueberries- high in phytonutrients that decrease inflammation 3) Salmon- marine omega-3s reduce joint swelling and pain 4) Pumpkin seeds- reduce inflammation 5) dark chocolate- reduces inflammation 6) turmeric- reduces inflammation 7) tart cherries - reduce pain and stiffness 8) extra virgin olive oil - its  compound olecanthal helps to block prostaglandins  9) chili peppers- can be eaten or applied topically via capsaicin  10) mint- helpful for headache, muscle aches, joint pain, and itching 11) garlic- reduces inflammation  Link to further information on diet for chronic pain: http://www.bray.com/   2. Sleep disturbance  Decrease Lunesta  to 1mg  HS  Continue to await CPAP -Try to go outside near sunrise -Get exercise during the day.  -Turn off all devices an hour before bedtime.  -Teas that can benefit: chamomile, valerian root, Brahmi (Bacopa) -Can consider over the counter melatonin or magnesium glycinate (latter can also help with  constipation) -Pistachios naturally increase the production of melatonin  -start amitriptyline  10mg  HS    3. Morbid Obesity  Continue follow up with dietitian  Encouraged weight loss  4. Myalgia   Will consider trigger point injections  5. Fibromyalgia  Prescribed aquatic therapy - had great benefit  Encouraged ROM, stretching  Continue Cymbalta   Decrease Lyrica  to 50mg  TID  6) Spinal surgical hardware infection -CRP, ESR, WBC, CMP -discussed that she contacted Dr. Dennise from infection disease -discussed plan to wean off Percocet -will continue on one year of antibiotics -Discussed that the hydrocodone  was helping with wound related pain which is still horrible. Will get UDS and pain contact today.  -discussed maximizing tylenol  which she is already doing  -avoid NSAIDs due to only one kidney present -discussed wean to one, then no opioids after wound vac removal -discussed that she is off suppression antibiotics right now -she has felt increased pan recently, CBC ordered -discussed that there is no infection anywhere  7) depression -discussed taking amitriptyline  earlier in the evening -continue to follow-up with neuropsych -discussed family stressors -recommended eating saurkraut -discussed kombucha, yogurt, and kefir.  -Discussed her desire to return to work.  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 Discussed that this is a capsaicin  patch, stronger than capsaicin  cream. Discussed that it is currently approved for diabetic peripheral neuropathy and post-herpetic neuralgia, but that it has also shown benefit in treating other forms of neuropathy. Provided patient  with link to site to learn more about the patch: https://www.qutenza .com/. Discussed that the patch would be placed in office and benefits usually last 3 months. Discussed that unintended exposure to capsaicin  can cause severe irritation of eyes, mucous membranes, respiratory tract, and skin, but that Qutenza  is a local treatment and does not have the systemic side effects of other nerve medications. Discussed that there may be pain, itching, erythema, and decreased sensory function associated with the application of Qutenza . Side effects usually subside within 1 week. A cold pack of analgesic medications can help with these side effects. Blood pressure can also be increased due to pain associated with administration of the patch.   Back brace ordered via [employer] and advised to use when bending, discussed that it is better to use the brace that the medicine  9) Intercostal neuralgia: -lidocaine  patch prescribed -discussed this could be secondary to her prior rib fractures  10) HLD: -discussed that she failed two statins so she qualifies for injectables

## 2023-09-19 NOTE — Telephone Encounter (Signed)
 Pt request auth be submitted for gel injection for the right knee

## 2023-09-19 NOTE — Progress Notes (Signed)
 Subjective:    Patient ID: Amy Hooper, female    DOB: 02-07-1951, 73 y.o.   MRN: [id number]  HPI   Pain Inventory Average Pain 7 Pain Right Now 7 My pain is intermittent, burning, dull, and tingling  LOCATION OF PAIN  Neck, knee, back, Hip   BOWEL Number of stools per week: Normal Oral laxative use No  Type of laxative N/A Enema or suppository use No  History of colostomy No  Incontinent No   BLADDER Normal In and out cath, frequency N/A Able to self cath No  Bladder incontinence No  Frequent urination No  Leakage with coughing No  Difficulty starting stream No  Incomplete bladder emptying No    Mobility walk with assistance use a cane ability to climb steps?  yes do you drive?  yes  Function employed # of hrs/week 16/24  Neuro/Psych numbness tingling spasms  Prior Studies Any changes since last visit?  no  Physicians involved in your care Any changes since last visit?  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     Social Drivers of Corporate investment banker Strain: Low Risk  (07/25/2023)   Overall Financial Resource Strain (CARDIA)    Difficulty of Paying Living Expenses: Not hard at all  Food Insecurity: No Food Insecurity (08/18/2023)   Hunger Vital Sign    Worried About Running Out of Food in the Last Year: Never true    Ran Out of Food in the Last Year: Never true  Transportation Needs: No Transportation Needs (08/18/2023)   PRAPARE - Administrator, Civil Service (Medical): No    Lack of Transportation (Non-Medical): No  Physical Activity: Inactive (07/25/2023)   Exercise Vital Sign    Days of Exercise per Week: 0 days    Minutes of Exercise per Session: 20 min  Stress: Stress Concern Present (07/25/2023)   [employer] of Occupational Health - Occupational Stress Questionnaire    Feeling of Stress : Rather much  Social Connections: Moderately Integrated (08/18/2023)   Social Connection and Isolation Panel    Frequency of Communication with Friends and Family: Three times a week    Frequency of  Social Gatherings with Friends and Family: Twice a week    Attends Religious Services: Never    Database administrator or Organizations: Yes    Attends Banker Meetings: Never    Marital Status: Married  Recent Concern: Social Connections - Moderately Isolated (07/25/2023)   Social Connection and Isolation Panel    Frequency of Communication with Friends and Family: Three times a week    Frequency of Social Gatherings with Friends and Family: Once a week    Attends Religious Services: Never    Database administrator or Organizations: No    Attends Banker Meetings: Never    Marital Status: Married   Past Surgical History:  Procedure Laterality Date   APPLICATION OF WOUND VAC N/A 03/25/2021   Procedure: APPLICATION OF WOUND VAC;   Surgeon: Barbarann Oneil BROCKS, MD;  Location: [facility];  Service: Orthopedics;  Laterality: N/A;   APPLICATION OF WOUND VAC N/A 03/30/2021   Procedure: WOUND VAC CHANGE 12x6x5;  Surgeon: Barbarann Oneil BROCKS, MD;  Location: [facility];  Service: Orthopedics;  Laterality: N/A;   INCISION AND DRAINAGE OF WOUND N/A 03/25/2021   Procedure: LUMBAR POST OP INCISION IRRIGATION;  Surgeon: Barbarann Oneil BROCKS, MD;  Location: [facility];  Service: Orthopedics;  Laterality: N/A;   JOINT REPLACEMENT     KNEE ARTHROSCOPY     LOOP RECORDER INSERTION N/A 08/23/2023   Procedure: LOOP RECORDER INSERTION;  Surgeon: Nancey Eulas BRAVO, MD;  [facility]: [facility];  Service: Cardiovascular;  Laterality: N/A;   LUMBAR WOUND DEBRIDEMENT N/A 03/30/2021   Procedure: REPEAT LUMBAR WOUND DEBRIDEMENT;  Surgeon: Barbarann Oneil BROCKS, MD;  Location: [facility];  Service: Orthopedics;  Laterality: N/A;   right rotator cuff     ROTATOR CUFF REPAIR Left    TOTAL KNEE ARTHROPLASTY Left 09/08/2020   Procedure: LEFT TOTAL KNEE ARTHROPLASTY;  Surgeon: Jerri Kay HERO, MD;  Location: [facility];  Service: Orthopedics;  Laterality: Left;   TUBAL LIGATION     Past Medical History:  Diagnosis Date   Allergy 1976   Tramadol  NSAIDS   Chronic kidney disease    Depression    Fibromyalgia    Gout    HTN (hypertension)    Hyperlipidemia 09/01/22   Insomnia    Morbid obesity (HCC)    Non-recurrent acute suppurative otitis media of left ear without spontaneous rupture of tympanic membrane 04/30/2022   Osteoarthritis    Palpitation    Sleep apnea    BP 125/77 (BP Location: Left Arm, Patient Position: Sitting)   Pulse (!) 49   Ht 5' 2 (1.575 m)   Wt 204 lb (92.5 kg)   SpO2 95%   BMI 37.31 kg/m   Opioid Risk Score:   Fall Risk Score:  `1  Depression screen [facility] 2/9     09/19/2023    3:44 PM 06/20/2023    2:43 PM 04/25/2023    8:16 AM 01/12/2023    8:21 AM 12/13/2022    1:47 PM 11/15/2022   11:12 AM 11/05/2022    1:10 PM  Depression screen PHQ 2/9  Decreased Interest 0 0  1 0 0 0 0  Down, Depressed, Hopeless 0 0 3 0 0 0 0  PHQ - 2 Score 0 0 4 0 0 0 0  Altered sleeping   3      Tired, decreased energy   3      Change in appetite   1  Feeling bad or failure about yourself    1      Trouble concentrating   1      Moving slowly or fidgety/restless   0      Suicidal thoughts   0      PHQ-9 Score   13      Difficult doing work/chores   Somewhat difficult        Review of Systems     Objective:   Physical Exam        Assessment & Plan:

## 2023-09-20 ENCOUNTER — Ambulatory Visit: Admitting: Rehabilitation

## 2023-09-20 ENCOUNTER — Encounter: Attending: Registered Nurse | Admitting: Physical Medicine and Rehabilitation

## 2023-09-20 DIAGNOSIS — Z79891 Long term (current) use of opiate analgesic: Secondary | ICD-10-CM | POA: Insufficient documentation

## 2023-09-20 DIAGNOSIS — G894 Chronic pain syndrome: Secondary | ICD-10-CM | POA: Insufficient documentation

## 2023-09-20 DIAGNOSIS — R001 Bradycardia, unspecified: Secondary | ICD-10-CM | POA: Insufficient documentation

## 2023-09-20 DIAGNOSIS — M792 Neuralgia and neuritis, unspecified: Secondary | ICD-10-CM | POA: Insufficient documentation

## 2023-09-20 DIAGNOSIS — M545 Low back pain, unspecified: Secondary | ICD-10-CM | POA: Diagnosis not present

## 2023-09-20 DIAGNOSIS — M25561 Pain in right knee: Secondary | ICD-10-CM | POA: Insufficient documentation

## 2023-09-20 DIAGNOSIS — Z5181 Encounter for therapeutic drug level monitoring: Secondary | ICD-10-CM | POA: Insufficient documentation

## 2023-09-20 DIAGNOSIS — M47817 Spondylosis without myelopathy or radiculopathy, lumbosacral region: Secondary | ICD-10-CM | POA: Insufficient documentation

## 2023-09-20 DIAGNOSIS — M25512 Pain in left shoulder: Secondary | ICD-10-CM | POA: Insufficient documentation

## 2023-09-20 DIAGNOSIS — G8929 Other chronic pain: Secondary | ICD-10-CM | POA: Insufficient documentation

## 2023-09-20 DIAGNOSIS — M25511 Pain in right shoulder: Secondary | ICD-10-CM | POA: Insufficient documentation

## 2023-09-20 LAB — CBC WITH DIFFERENTIAL/PLATELET
Basophils Absolute: 0 10*3/uL (ref 0.0–0.2)
Basos: 1 %
EOS (ABSOLUTE): 0.3 10*3/uL (ref 0.0–0.4)
Eos: 5 %
Hematocrit: 41.1 % (ref 34.0–46.6)
Hemoglobin: 13 g/dL (ref 11.1–15.9)
Immature Grans (Abs): 0 10*3/uL (ref 0.0–0.1)
Immature Granulocytes: 0 %
Lymphocytes Absolute: 2.8 10*3/uL (ref 0.7–3.1)
Lymphs: 53 %
MCH: 29.3 pg (ref 26.6–33.0)
MCHC: 31.6 g/dL (ref 31.5–35.7)
MCV: 93 fL (ref 79–97)
Monocytes Absolute: 0.4 10*3/uL (ref 0.1–0.9)
Monocytes: 8 %
Neutrophils Absolute: 1.8 10*3/uL (ref 1.4–7.0)
Neutrophils: 33 %
Platelets: 316 10*3/uL (ref 150–450)
RBC: 4.43 x10E6/uL (ref 3.77–5.28)
RDW: 13.9 % (ref 11.7–15.4)
WBC: 5.3 10*3/uL (ref 3.4–10.8)

## 2023-09-20 LAB — COMPREHENSIVE METABOLIC PANEL WITH GFR
ALT: 9 IU/L (ref 0–32)
AST: 18 IU/L (ref 0–40)
Albumin: 4.2 g/dL (ref 3.9–4.9)
Alkaline Phosphatase: 129 IU/L — ABNORMAL HIGH (ref 44–121)
BUN/Creatinine Ratio: 13 (ref 12–28)
BUN: 20 mg/dL (ref 8–27)
Bilirubin Total: 0.3 mg/dL (ref 0.0–1.2)
CO2: 21 mmol/L (ref 20–29)
Calcium: 9.8 mg/dL (ref 8.7–10.3)
Chloride: 106 mmol/L (ref 96–106)
Creatinine, Ser: 1.59 mg/dL — ABNORMAL HIGH (ref 0.57–1.00)
Globulin, Total: 2.6 g/dL (ref 1.5–4.5)
Glucose: 83 mg/dL (ref 70–99)
Potassium: 4.7 mmol/L (ref 3.5–5.2)
Sodium: 141 mmol/L (ref 134–144)
Total Protein: 6.8 g/dL (ref 6.0–8.5)
eGFR: 35 mL/min/{1.73_m2} — ABNORMAL LOW (ref >59)

## 2023-09-20 LAB — SEDIMENTATION RATE: Sed Rate: 20 mm/h (ref 0–40)

## 2023-09-20 LAB — C-REACTIVE PROTEIN: CRP: 2 mg/L (ref 0–10)

## 2023-09-20 MED ORDER — DIAZEPAM 5 MG PO TABS: 5.0000 mg | ORAL_TABLET | Freq: Once | ORAL | 0 refills | Status: AC

## 2023-09-20 NOTE — Telephone Encounter (Signed)
 sure

## 2023-09-20 NOTE — Progress Notes (Unsigned)
 Subjective:    Patient ID: Amy Hooper, female    DOB: 05/24/1950, 73 y.o.   MRN: [id number]  HPI An audio/video tele-health visit is felt to be the most appropriate encounter for this patient at this time. This is a follow up tele-visit via phone. The patient is at home. MD is at office. Prior to scheduling this appointment, our staff discussed the limitations of evaluation and management by telemedicine and the availability of in-person appointments. The patient expressed understanding and agreed to proceed.   Female with f/u OSA, morbid obesity, right foot neuropathy, hypertension, fibromyalgia, depression, gout, CKD, degenerative disc disease-lumbar, bilateral knee surgeries for torn mensici, bilateral shoulder surgeries for rotator cuff presents with bilateral L > R knee pain and wound-related pain. She presents for follow-up today regarding her food allergies  1) Back pain -she has been having back pain -she was having pain in her back before the stroke as well -she does not want to go back to Dr. Barbarann. -she is un   2) Knee pain: -she had relief from synvisc -she has terrible pain during the night -patches and the gel do not help.  Initially stated: Started ~1990. After a fall on her knees.  Progressively getting worse.  Steroid injections improve the pain along with brace.  Ambulation exacerbates the pain.  Moving after prolonged postures exacerbates the pain.  Achy.  Radiates down leg.  Intermittent. Denies associated weakness, numbness.  Tylenol . 1 fall summer of 2021 falling backward in chair.  Pain limits ambulation.  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 She has had bilateral L4-5 lumbar epidural steroid injections.  She has also had facet injections.  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  Irritation of S1 nerve roots.  She had a  left knee steroid injection on 03/18/2020 with Ortho.  She is seen rheumatology as well.  No reviewed from rheumatology, orthopedic surgeon, physiatrist, neurology-plan for knee replacement in December 2022. She does note that she is improving with weight loss and dietary changes.   3) Hand pain -spiked since she has been off Celebrex due to her kidney injury -she was recommended to take Qunidine but she can't take because of the antibiotics which she is on for a year due to her wound in her back.  -percocet does not help with the pain as much as the hydrocodone .   4) AKI -improved since off Celebrex  5) Depression:  -she feels miserable.  -she was started on Cymbalta . This did help with the pain in her legs.  -she is not sure if she needs to see anyone for grief  6) Insomnia: -not sleeping well.  -she cannot sleep without Lunesta  and is having an issue with this for her insurance -she also takes Amitriptyline , Lyrica , and Cymbalta - when she takes all these she does not get up until 11am the next day.  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  Since last visit, she states she has had great improvement with pool therapy. She states she is hungry. She had good benefits with Lyrica . She has not seen Nephro yet. She is awaiting CPAP.  She has bad fibromyalgia flares when the weather changes- especially when it rains. She was started on  Neurontin by Dr. Tobie and it does help on a daily basis. She had relief with Celebrex well before but had to stop due to CKD. She has never tried steroids.   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   Situational depression: She is going through changes with her family. They are all living in her house and everyone is arguing. She does not want to get herself worked up due to her hypertension. Her family brings her in a lot. Her husband says why don't you say anything. She felt a car coming across her yesterday and she screamed. She feels overwhelmed. Her mother may have undiagnosed mental illness. Her mom called th police on her husband.   Severe arthritis and bulging discs: -she has a lot of pain and nothing helps it. If she sits too long it hurts and if she walks to long it hurts -Lyrica  and steroids help her pain -she is planing to have surgery.   Weight gain  Not voiding as much as she used to -check Creatinine today.    Pain Inventory Average Pain 9 Pain Right Now 8 My pain is intermittent, burning, dull, and tingling, stabbing  In the last 24 hours, has pain interfered with the following? General activity 8 Relation with others 9 Enjoyment of life 10 What TIME of day is your pain at its worst? morning , daytime, and evening Sleep (in general) Fair  Pain is worse with: walking, bending, sitting, standing, and some activites Pain improves with: heat/ice and medication Relief from Meds: 7   Family History  Problem Relation Age of Onset  . Hypothyroidism Mother   . Hyperlipidemia Mother   . Miscarriages / [place] Mother   . Hyperlipidemia Other   . Kidney failure Father   . Dementia Father   . Gout Father   . Arthritis Father   . Prostate cancer Father   . Heart disease Father   . Hypertension Father   . Kidney disease Father   . Stroke Father    Social History   Socioeconomic History  . Marital status: Married    Spouse name: Not on file  . Number of children: 3   . Years of education: Not on file  . Highest education level: Bachelor's degree (e.g., BA, AB, BS)  Occupational History  . 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 Smoking status: Never    Passive exposure: Current  . Smokeless tobacco: Never  . Tobacco comments:    I don't smoke  Vaping Use  . Vaping status: Never Used  Substance and Sexual Activity  . Alcohol use: Not Currently    Comment: No alcohol since I've been taking opioids  . Drug use: Never  . Sexual activity: Yes    Birth control/protection: Surgical, None  Other Topics Concern  . Not on file  Social History Narrative   Lives gives with fiance.     Social Drivers of Health   Financial Resource Strain: Low Risk  (07/25/2023)   Overall Financial  Resource Strain (CARDIA)   . Difficulty of Paying Living Expenses: Not hard at all  Food Insecurity: No Food Insecurity (08/18/2023)   Hunger Vital Sign   . Worried About Programme researcher, broadcasting/film/video in the Last Year: Never true   . Ran Out of Food in the Last Year: Never true  Transportation Needs: No Transportation Needs (08/18/2023)   PRAPARE - Transportation   . Lack of Transportation (Medical): No   . Lack of Transportation (Non-Medical): No  Physical Activity: Inactive (07/25/2023)   Exercise Vital Sign   . Days of Exercise per Week: 0 days   . Minutes of Exercise per Session: 20 min  Stress: Stress Concern Present (07/25/2023)   [employer] of Occupational Health - Occupational Stress Questionnaire   . Feeling of Stress : Rather much  Social Connections: Moderately Integrated (08/18/2023)   Social Connection and Isolation Panel   . Frequency of Communication with Friends and Family: Three times a week   . Frequency of Social Gatherings with Friends and Family: Twice a week   . Attends Religious Services: Never   . Active Member of Clubs or Organizations: Yes   . Attends Banker Meetings: Never   . Marital Status: Married  Recent Concern: Social Connections -  Moderately Isolated (07/25/2023)   Social Connection and Isolation Panel   . Frequency of Communication with Friends and Family: Three times a week   . Frequency of Social Gatherings with Friends and Family: Once a week   . Attends Religious Services: Never   . Active Member of Clubs or Organizations: No   . Attends Banker Meetings: Never   . Marital Status: Married   Past Surgical History:  Procedure Laterality Date  . APPLICATION OF WOUND VAC N/A 03/25/2021   Procedure: APPLICATION OF WOUND VAC;  Surgeon: Barbarann Oneil BROCKS, MD;  Location: [facility] OR;  Service: Orthopedics;  Laterality: N/A;  . APPLICATION OF WOUND VAC N/A 03/30/2021   Procedure: WOUND VAC CHANGE 12x6x5;  Surgeon: Barbarann Oneil BROCKS, MD;  Location: [facility] OR;  Service: Orthopedics;  Laterality: N/A;  . INCISION AND DRAINAGE OF WOUND N/A 03/25/2021   Procedure: LUMBAR POST OP INCISION IRRIGATION;  Surgeon: Barbarann Oneil BROCKS, MD;  Location: [facility] OR;  Service: Orthopedics;  Laterality: N/A;  . JOINT REPLACEMENT    . KNEE ARTHROSCOPY    . LOOP RECORDER INSERTION N/A 08/23/2023   Procedure: LOOP RECORDER INSERTION;  Surgeon: Nancey Eulas BRAVO, MD;  Location: [facility];  Service: Cardiovascular;  Laterality: N/A;  . LUMBAR WOUND DEBRIDEMENT N/A 03/30/2021   Procedure: REPEAT LUMBAR WOUND DEBRIDEMENT;  Surgeon: Barbarann Oneil BROCKS, MD;  Location: [facility] OR;  Service: Orthopedics;  Laterality: N/A;  . right rotator cuff    . ROTATOR CUFF REPAIR Left   . TOTAL KNEE ARTHROPLASTY Left 09/08/2020   Procedure: LEFT TOTAL KNEE ARTHROPLASTY;  Surgeon: Jerri Kay HERO, MD;  Location: [facility] OR;  Service: Orthopedics;  Laterality: Left;  . TUBAL LIGATION     Past Medical History:  Diagnosis Date  . Allergy 1976   Tramadol  NSAIDS  . Chronic kidney disease   . Depression   . Fibromyalgia   . Gout   . HTN (hypertension)   . Hyperlipidemia 09/01/22  . Insomnia   . Morbid obesity (HCC)   . Non-recurrent acute suppurative otitis media of left ear without  spontaneous rupture of tympanic membrane 04/30/2022  . Osteoarthritis   . Palpitation   . Sleep apnea  There were no vitals taken for this visit.  Opioid Risk Score:   Fall Risk Score:  `1  Depression screen [facility] 2/9     09/19/2023    3:44 PM 06/20/2023    2:43 PM 04/25/2023    8:16 AM 01/12/2023    8:21 AM 12/13/2022    1:47 PM 11/15/2022   11:12 AM 11/05/2022    1:10 PM  Depression screen [facility] 2/9  Decreased Interest 0 0 1 0 0 0 0  Down, Depressed, Hopeless 0 0 3 0 0 0 0  PHQ - 2 Score 0 0 4 0 0 0 0  Altered sleeping   3      Tired, decreased energy   3      Change in appetite   1      Feeling bad or failure about yourself    1      Trouble concentrating   1      Moving slowly or fidgety/restless   0      Suicidal thoughts   0      PHQ-9 Score   13      Difficult doing work/chores   Somewhat difficult       Review of Systems  Constitutional: Negative.   HENT: Negative.    Eyes: Negative.   Respiratory: Negative.    Cardiovascular: Negative.   Gastrointestinal: Negative.   Endocrine: Negative.   Genitourinary: Negative.   Musculoskeletal:  Positive for arthralgias, back pain and gait problem. Negative for joint swelling, myalgias, neck pain and neck stiffness.       Pain in both legs  Allergic/Immunologic: Negative.   Hematological: Negative.   All other systems reviewed and are negative.     Objective:   Physical Exam  PRIOR EXAM: Gen: no distress, normal appearing, weight 200 lbs, BMI 34.57, BP 135/77 HEENT: oral mucosa pink and moist, NCAT Cardio: Reg rate Chest: normal effort, normal rate of breathing Abd: soft, non-distended Ext: no edema Psych: pleasant, normal affect Skin: no open lesions on right foot Neuro: Alert and oriented x3    Assessment & Plan:  Female with past medical history/past surgical history of OSA, morbid obesity, hypertension, fibromyalgia, depression, gout, CKD, degenerative disc disease-lumbar, bilateral knee surgeries for torn  mensici, bilateral shoulder surgeries for rotator cuff presents with bilateral L > R knee pain.   1. Bilateral knee OA - endstage  Discussed plan for knee surgery  -discussed that weight loss will help  Patient was supposed to have knee replacements last year, but due to insurance changed to summer 2022  Endstage OA in b/l knee per Ortho, no films available  No benefit with Heat/Cold, PT (~2019), Robaxin , Flexaril, Tizanidine   Continue bracing prn (limit on right knee - educated)  Continue Voltaren gel  Lidocaine  5% patch ordered  -Provided with a pain relief journal and discussed that it contains foods and lifestyle tips to naturally help to improve pain. Discussed that these lifestyle strategies are also very good for health unlike some medications which can have negative side effects. Discussed that the act of keeping a journal can be therapeutic and helpful to realize patterns what helps to trigger and alleviate pain.    Decreased Cymbalta  to 20mg  daily.   Patient states main goal is to ambulate  Wants to buy a pool - afraid of public locations due to CoVid  Recommended follow up with Nephro regarding Chondroitin sulfate, appointment next month  Tolerating mild exercise - limited by back  Continue antiinflammatory diet -Discussed following foods that may reduce pain: 1) Ginger (especially studied for arthritis)- reduce leukotriene production to decrease inflammation 2) Blueberries- high in phytonutrients that decrease inflammation 3) Salmon- marine omega-3s reduce joint swelling and pain 4) Pumpkin seeds- reduce inflammation 5) dark chocolate- reduces inflammation 6) turmeric- reduces inflammation 7) tart cherries - reduce pain and stiffness 8) extra virgin olive oil - its compound olecanthal helps to block prostaglandins  9) chili peppers- can be eaten or applied topically via capsaicin  10) mint- helpful for headache, muscle aches, joint pain, and itching 11) garlic- reduces  inflammation  Link to further information on diet for chronic pain: http://www.bray.com/   2. Sleep disturbance  Decrease Lunesta  to 1mg  HS  Continue to await CPAP -Try to go outside near sunrise -Get exercise during the day.  -Turn off all devices an hour before bedtime.  -Teas that can benefit: chamomile, valerian root, Brahmi (Bacopa) -Can consider over the counter melatonin or magnesium glycinate (latter can also help with constipation) -Pistachios naturally increase the production of melatonin  -start amitriptyline  10mg  HS    3. Morbid Obesity  Continue follow up with dietitian  Encouraged weight loss  4. Myalgia   Will consider trigger point injections  5. Fibromyalgia  Prescribed aquatic therapy - had great benefit  Encouraged ROM, stretching  Continue Cymbalta   Decrease Lyrica  to 50mg  TID  6) Spinal surgical hardware infection -CRP, ESR, WBC, CMP -discussed that she contacted Dr. Dennise from infection disease -discussed plan to wean off Percocet -will continue on one year of antibiotics -Discussed that the hydrocodone  was helping with wound related pain which is still horrible. Will get UDS and pain contact today.  -discussed maximizing tylenol  which she is already doing  -avoid NSAIDs due to only one kidney present -discussed wean to one, then no opioids after wound vac removal -discussed that she is off suppression antibiotics right now -she has felt increased pan recently, CBC ordered -discussed that there is no infection anywhere  7) depression -discussed taking amitriptyline  earlier in the evening -continue to follow-up with neuropsych -discussed family stressors -recommended eating saurkraut -discussed kombucha, yogurt, and kefir.  -Discussed her desire to return to work.  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 Discussed that this is a capsaicin  patch, stronger than capsaicin  cream. Discussed that it is currently approved for diabetic peripheral neuropathy and post-herpetic neuralgia, but that it has also shown benefit in treating other forms of neuropathy. Provided patient with link to site to learn more about the patch: https://www.qutenza .com/. Discussed that the patch would be placed in office and benefits usually last 3 months. Discussed that unintended exposure to capsaicin  can cause severe irritation of eyes, mucous membranes, respiratory tract, and skin, but that Qutenza  is a local treatment and does not have the systemic side effects of other nerve medications. Discussed that there may be pain, itching, erythema, and decreased sensory function associated with the application of Qutenza . Side effects usually subside within 1 week. A cold pack of analgesic medications can help with these side effects. Blood pressure can also be increased due to pain associated with administration of the patch.   Back brace ordered via [employer] and advised to use when bending, discussed that it is  better to use the brace that the medicine  9) Intercostal neuralgia: -lidocaine  patch prescribed -discussed this could be secondary to her prior rib fractures  10) HLD: -discussed that she failed two statins so she qualifies for injectables

## 2023-09-21 NOTE — Telephone Encounter (Signed)
 VOB submitted for Monovisc, right knee

## 2023-09-23 ENCOUNTER — Ambulatory Visit

## 2023-09-23 DIAGNOSIS — R002 Palpitations: Secondary | ICD-10-CM | POA: Diagnosis not present

## 2023-09-26 ENCOUNTER — Encounter: Payer: Self-pay | Admitting: Family Medicine

## 2023-09-26 ENCOUNTER — Ambulatory Visit (INDEPENDENT_AMBULATORY_CARE_PROVIDER_SITE_OTHER): Admitting: Family Medicine

## 2023-09-26 VITALS — BP 150/80 | HR 54 | Ht 62.0 in | Wt 204.5 lb

## 2023-09-26 DIAGNOSIS — I63522 Cerebral infarction due to unspecified occlusion or stenosis of left anterior cerebral artery: Secondary | ICD-10-CM

## 2023-09-26 DIAGNOSIS — G4733 Obstructive sleep apnea (adult) (pediatric): Secondary | ICD-10-CM

## 2023-09-26 LAB — CUP PACEART REMOTE DEVICE CHECK
Date Time Interrogation Session: 20250704223653
Implantable Pulse Generator Implant Date: 20250603

## 2023-09-27 ENCOUNTER — Ambulatory Visit: Payer: Self-pay | Admitting: Cardiovascular Disease

## 2023-09-27 ENCOUNTER — Ambulatory Visit: Admitting: Rehabilitation

## 2023-09-27 NOTE — Therapy (Signed)
 OUTPATIENT PHYSICAL THERAPY NEURO TREATMENT   Patient Name: Amy Hooper MRN: [id number] DOB:12-16-1950, 73 y.o., female Today's Date: 09/27/2023   PCP: Oris Camie BRAVO, NP REFERRING PROVIDER: Lorilee Sven SQUIBB, MD  END OF SESSION:    Past Medical History:  Diagnosis Date   Allergy 1976   Tramadol  NSAIDS   Chronic kidney disease    Depression    Fibromyalgia    Gout    HTN (hypertension)    Hyperlipidemia 09/01/22   Insomnia    Morbid obesity (HCC)    Non-recurrent acute suppurative otitis media of left ear without spontaneous rupture of tympanic membrane 04/30/2022   Osteoarthritis    Palpitation    Sleep apnea    Stroke (HCC) 08/23/2023   Past Surgical History:  Procedure Laterality Date   APPLICATION OF WOUND VAC N/A 03/25/2021   Procedure: APPLICATION OF WOUND VAC;  Surgeon: Barbarann Oneil BROCKS, MD;  Location: [facility];  Service: Orthopedics;  Laterality: N/A;   APPLICATION OF WOUND VAC N/A 03/30/2021   Procedure: WOUND VAC CHANGE 12x6x5;  Surgeon: Barbarann Oneil BROCKS, MD;  Location: [facility];  Service: Orthopedics;  Laterality: N/A;   INCISION AND DRAINAGE OF WOUND N/A 03/25/2021   Procedure: LUMBAR POST OP INCISION IRRIGATION;  Surgeon: Barbarann Oneil BROCKS, MD;  Location: [facility];  Service: Orthopedics;  Laterality: N/A;   JOINT REPLACEMENT     KNEE ARTHROSCOPY     LOOP RECORDER INSERTION N/A 08/23/2023   Procedure: LOOP RECORDER INSERTION;  Surgeon: Nancey Eulas BRAVO, MD;  Location: [facility];  Service: Cardiovascular;  Laterality: N/A;   LUMBAR WOUND DEBRIDEMENT N/A 03/30/2021   Procedure: REPEAT LUMBAR WOUND DEBRIDEMENT;  Surgeon: Barbarann Oneil BROCKS, MD;  Location: [facility];  Service: Orthopedics;  Laterality: N/A;   right rotator cuff     ROTATOR CUFF REPAIR Left    TOTAL KNEE ARTHROPLASTY Left 09/08/2020   Procedure: LEFT TOTAL KNEE ARTHROPLASTY;  Surgeon: Jerri Kay HERO, MD;  Location: [facility];  Service: Orthopedics;  Laterality: Left;   TUBAL LIGATION     Patient Active Problem  List   Diagnosis Date Noted   Acute ischemic left ACA stroke (HCC) 08/23/2023   CVA (cerebrovascular accident) (HCC) 08/18/2023   Premature atrial complexes 08/18/2023   Obesity (BMI 30-39.9) 08/18/2023   Nocturnal leg cramps 07/27/2023   PAC (premature atrial contraction) 12/20/2022   Forearm tendonitis 07/23/2022   Rash and nonspecific skin eruption 07/23/2022   Asymptomatic bradycardia 07/23/2022   Rheumatoid arthritis with rheumatoid factor of multiple sites without organ or systems involvement (HCC) 04/30/2022   Osteopenia 03/03/2022   Degenerative spondylolisthesis 02/19/2021   Status post total left knee replacement 09/08/2020   OSA (obstructive sleep apnea) 05/19/2020   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/17/2020   Chronic pain syndrome 03/03/2020   S/P lumbar fusion 02/05/2020   Vitamin D  deficiency 01/09/2020   Hyperlipidemia 01/09/2020   CKD (chronic kidney disease) stage 3, GFR 30-59 ml/min (HCC) 01/09/2020   Essential hypertension    Morbid obesity (HCC)    Gout    Fibromyalgia    Depression    Insomnia    Chronically low serum potassium 04/22/2014   Environmental and seasonal allergies 04/22/2014   Palpitations 04/22/2014    ONSET DATE: 08/23/23  REFERRING DIAG:  I63.9 (ICD-10-CM) - Cerebrovascular accident (CVA), unspecified mechanism (HCC)    THERAPY DIAG:  No diagnosis found.  Rationale for Evaluation and Treatment: Rehabilitation  SUBJECTIVE:  SUBJECTIVE STATEMENT: CVA early June 2025 with hospitalization/inpatient rehab w/ D/C to home at independent/modified independent level.  Has been having back issues before CVA and reports hx of  Right knee and chronic LBP s/p fusion. Reports increased right knee pain and back pain since time in rehab and returning  home. Reports she has not sought intervention for current knee/back pain Reports hx of RLE neuropathy, sciatica, and has been accentuated since CVA. Prior to CVA enjoys outdoor activities such as gardening. Now using cane for mobility due to right knee/back pain Pt accompanied by: self  PERTINENT HISTORY: istory of HTN, T2DM, OSA, CKD 3B, fibromyalgia who was admitted on 08/18/2023 with reports of funny feeling oral, progressive RUE weakness at night prior to admission the next day with RUE weakness on awakening. 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 MRI brain showed area of small acute ischemia within posterior paramedian left frontal lobe and multifocal hyperintense T2 weighted signals within white matter. Dr. Jerri felt the stroke was of unclear etiology question cardiac source and loop recorder was placed at discharge Hx of lumbar surgery (fusion?) with infection complications.  PAIN:  Are you having pain? Yes: NPRS scale: 6 (back); 7 (R knee) Pain location: central lumbar; right knee medial compartment Pain description: sharp, burn, pain Aggravating factors: standing, upright Relieving factors: rest,   PRECAUTIONS: Fall  RED FLAGS: None   WEIGHT BEARING RESTRICTIONS: No  FALLS: Has patient fallen in last 6 months? No  LIVING ENVIRONMENT: Lives with:  Lives in: House/apartment Stairs: 7 steps to enter, ground floor Has following equipment at home: Single point cane  PLOF: Independent, Independent with basic ADLs, and Independent with household mobility without device  PATIENT GOALS:   OBJECTIVE:       TODAY'S TREATMENT: 09/29/23 Activity Comments                             Note: Objective measures were completed at Evaluation unless otherwise noted.  DIAGNOSTIC FINDINGS:   COGNITION: Overall cognitive status: Within functional limits for tasks assessed   SENSATION: Numbness along right lateral foot (since  back surgery)  COORDINATION:  WNL  MUSCLE TONE: WNL      POSTURE: increased lumbar lordosis  LOWER EXTREMITY ROM:     Active  Right Eval Left Eval  Hip flexion 100+ 100+  Hip extension    Hip abduction    Hip adduction    Hip internal rotation    Hip external rotation    Knee flexion 115 115  Knee extension -5 0  Ankle dorsiflexion 7 14  Ankle plantarflexion    Ankle inversion    Ankle eversion     (Blank rows = not tested)  LOWER EXTREMITY MMT:  resisted tests in sitting  MMT Right Eval Left Eval  Hip flexion 4 5  Hip extension    Hip abduction 4 4  Hip adduction    Hip internal rotation    Hip external rotation    Knee flexion 4 5  Knee extension 4 5  Ankle dorsiflexion 3+ 4  Ankle plantarflexion 2+ 3-  Ankle inversion    Ankle eversion    (Blank rows = not tested)  Trunk flexion: 2+/5 Trunk extension: NT Single leg stance LLE: 10+ sec; RLE: 5-8 sec  BED MOBILITY:  indep  TRANSFERS: Sit-stand and chair-chair independent Floor to chair/stand: DNT   CURB:  Findings: modified indep  STAIRS: Findings: Comments: modified indep step-to pattern GAIT: Findings: Distance walked:   and Comments: mildly antalgic RLE Independent-mod I level surfaces; CGA uneven FUNCTIONAL TESTS:  5 times sit to stand: 27 sec 2 minute walk test:  10 meter walk test: 15.4 sec = 2.12 ft/sec  M-CTSIB  Condition 1: Firm Surface, EO 30 Sec, Normal Sway  Condition 2: Firm Surface, EC 30 Sec, Normal Sway  Condition 3: Foam Surface, EO  Sec,  Sway  Condition 4: Foam Surface, EC  Sec,  Sway    Dynamic Gait Index: Dynamic Gait Index  Mark the lowest level that applies.   Date Performed 6/24  Gait level surface (2) Mild Impairment: Walks 20', uses AD, slower speed, mild gait deviations  2. Change in gait speed (3) Normal: Able to smoothly change walking speed without loss of balance or gait deviation. Shows a significant difference in walking speeds between normal,  fast and slow speeds  3. Gait with horizontal head turns (2) Mild Impairment: Performs head turns smoothly with slight change in gait velocity, i.e., minor disruption to smooth gait path or uses walking aid  4. Gait with vertical head turns (2) Mild Impairment: Performs head turns smoothly with slight change in gait velocity, i.e., minor disruption to smooth gait path or uses walking aid  5. Gait and pivot turn (2) Mild Impairment: Pivot turns safely in > 3 seconds and stops with no loss of balance  6. Step over obstacle (2) Mild Impairment: Is able to step over box, but must slow down and adjust steps to clear box safely  7. Step around obstacle (3) Normal: Is able to walk around cones safely without changing gait speed; no evidence of imbalance  8. Steps (1) Moderate Impairment: Two feet to a stair, must use rail  Total score 17/24    Score Interpretation: Score of <19 indicates high risk of falls.  Minimally Clinically Important Difference (MCID):  =DGI scores of<21/24 = 1.80 points DGI scores of >21/24 = 0.60 points   [place] T, Inbar-Borovsky N, Brozgol M, Giladi N, [place] JM. The Dynamic Gait Index in healthy older adults: the role of stair climbing, fear of falling and gender. Gait Posture. 2009 Feb;29(2):237-41. doi: 10.1016/j.gaitpost.2008.08.013. Epub 2008 Oct 8. PMID: 81154560; PMCID: [id number].  Pardasaney, MYRTIS LOIS Bonus, GEANNIE POUR., et al. (2012). Sensitivity to change and responsiveness of four balance measures for community-dwelling older adults. Physical therapy 92(3): 388-397.                                                                                                                                    TREATMENT DATE: 09/13/23    PATIENT EDUCATION: Education details: assessment details, rationale of PT intervention, benefit of aquatic PT, stroke risk factors: modifiable vs non-modifiable Person educated: Patient Education method: Explanation and Handouts Education  comprehension: verbalized understanding  HOME EXERCISE PROGRAM: Access Code: [email or web address] URL: [email or web address] Date: 09/13/2023 Prepared by: Burnard  Halpin  Exercises - Supine Posterior Pelvic Tilt  - 2-3 x weekly - 3 sets - 10 reps - Hooklying Isometric Hip Flexion  - 1 x daily - 2-3 x weekly - 3 sets - 10 reps - Supine Quad Set  - 1 x daily - 2-3 x weekly - 3 sets - 10 reps  GOALS: Goals reviewed with patient? Yes  SHORT TERM GOALS: Target date: 10/11/2023    Patient will be independent in HEP to improve functional outcomes Baseline: Goal status: IN PROGRESS   2.  Demo improved BLE strength and balance per time < 20 sec 5xSTS test Baseline: 27 sec Goal status:  IN PROGRESS   3.  Independent ambulation uneven ground to return to gardening activities Baseline: CGA Goal status:  IN PROGRESS     LONG TERM GOALS: Target date: 11/08/2023    Independent w/ advanced HEP to include land and aquatic-based elements to prepare for D/C Baseline:  Goal status:  IN PROGRESS   2.  Modified independent floor/ground to stand to improve mobility and safety with gardening activities Baseline: NT Goal status:  IN PROGRESS   3.  Demo low risk for falls per score 20/24 Dynamic Gait Index to improve safety with mobility Baseline:  Goal status:  IN PROGRESS   4.  Improve gait speed to 2.8 ft/sec to improve efficiency community ambulation Baseline: 2.12 no AD Goal status:  IN PROGRESS   5.  Improve RLE strength to 5/5 for improved single limb support/stability Baseline: 4/5 Goal status:  IN PROGRESS   6.  Back/right knee pain not to exceed 3/10 with exercise/functional mobility/activities Baseline: 6/10 Goal status:  IN PROGRESS   ASSESSMENT:  CLINICAL IMPRESSION: Patient is a 73 y.o. lady who was seen today for physical therapy evaluation and treatment for hx of CVA.  Thankfully, her deficits and limitations from CVA are not apparent although she does exhibit  generalized weakness affecting RLE > LLE but notes acute on chronic issues of back and right knee pain w/ hx of sciatica affecting RLE with sensory disturbance to lateral foot as pre-morbid condition.  Exhibits LE weakness and increased risk for falls per time on 5xSTS and score DGI 17/24.  Normal Romberg test and low risk for static balance per Lars Balance Test.  Pt would benefit from ongoing PT services to address back and RLE pain/weakness and postural/body mechanics to improve functional activity tolerance and return to typical activity levels.   Patient would benefit from aquatic-based PT intervention. This approach allows for a whole-body focus, with special attention on activating and relearning proper biomechanics in injured areas. Additionally, water  as a modality can both strengthen and facilitate the whole body; water 's unique properties can reduce impact of exercise on joints, increase pain-free movement, improve microcirculation, and help increase muscle tone through natural resistance to improve this patient's functional mobility  OBJECTIVE IMPAIRMENTS: Abnormal gait, decreased activity tolerance, decreased balance, decreased mobility, difficulty walking, decreased strength, improper body mechanics, and pain.   ACTIVITY LIMITATIONS: carrying, lifting, bending, stairs, transfers, and locomotion level  PARTICIPATION LIMITATIONS: meal prep, cleaning, laundry, community activity, occupation, and yard work  PERSONAL FACTORS: Age, Time since onset of injury/illness/exacerbation, and 1-2 comorbidities: PMH are also affecting patient's functional outcome.   REHAB POTENTIAL: Excellent  CLINICAL DECISION MAKING: Evolving/moderate complexity  EVALUATION COMPLEXITY: Moderate  PLAN:  PT FREQUENCY: 1-2x/week  PT DURATION: 8 weeks  PLANNED INTERVENTIONS: 97750- Physical Performance Testing, 97110-Therapeutic exercises, 97530- Therapeutic activity, [id number]- Neuromuscular re-education, 97535-  Self Care,  [id number]- Manual therapy, [id number]- Gait training, [id number]- Aquatic Therapy, [phone]- Electrical stimulation (unattended), and 20560 (1-2 muscles), 20561 (3+ muscles)- Dry Needling  PLAN FOR NEXT SESSION: HEP review/progressions, balance+strength activities, floor to stand/sit

## 2023-09-28 ENCOUNTER — Other Ambulatory Visit: Payer: Self-pay | Admitting: Physical Medicine and Rehabilitation

## 2023-09-28 ENCOUNTER — Ambulatory Visit
Admission: RE | Admit: 2023-09-28 | Discharge: 2023-09-28 | Disposition: A | Discharge status: Home / Self Care | Source: Ambulatory Visit | Attending: Physical Medicine and Rehabilitation

## 2023-09-28 ENCOUNTER — Ambulatory Visit
Admission: RE | Admit: 2023-09-28 | Discharge: 2023-09-28 | Disposition: A | Discharge status: Home / Self Care | Source: Ambulatory Visit | Attending: Physical Medicine and Rehabilitation | Admitting: Physical Medicine and Rehabilitation

## 2023-09-28 DIAGNOSIS — G8929 Other chronic pain: Secondary | ICD-10-CM

## 2023-09-28 DIAGNOSIS — Z8619 Personal history of other infectious and parasitic diseases: Secondary | ICD-10-CM

## 2023-09-29 ENCOUNTER — Ambulatory Visit: Attending: Physical Medicine and Rehabilitation | Admitting: Physical Therapy

## 2023-09-29 ENCOUNTER — Encounter: Payer: Self-pay | Admitting: Physical Therapy

## 2023-09-29 DIAGNOSIS — M6281 Muscle weakness (generalized): Secondary | ICD-10-CM | POA: Diagnosis present

## 2023-09-29 DIAGNOSIS — R2681 Unsteadiness on feet: Secondary | ICD-10-CM | POA: Diagnosis present

## 2023-09-29 DIAGNOSIS — M5459 Other low back pain: Secondary | ICD-10-CM | POA: Diagnosis present

## 2023-09-29 DIAGNOSIS — I63522 Cerebral infarction due to unspecified occlusion or stenosis of left anterior cerebral artery: Secondary | ICD-10-CM | POA: Diagnosis present

## 2023-10-03 ENCOUNTER — Encounter (HOSPITAL_BASED_OUTPATIENT_CLINIC_OR_DEPARTMENT_OTHER): Admitting: Physical Medicine and Rehabilitation

## 2023-10-03 DIAGNOSIS — M47817 Spondylosis without myelopathy or radiculopathy, lumbosacral region: Secondary | ICD-10-CM

## 2023-10-03 NOTE — Progress Notes (Signed)
 Subjective:    Patient ID: Amy Hooper, female    DOB: 04-Apr-1950, 73 y.o.   MRN: [id number]  HPI An audio/video tele-health visit is felt to be the most appropriate encounter for this patient at this time. This is a follow up tele-visit via phone. The patient is at home. MD is at office. Prior to scheduling this appointment, our staff discussed the limitations of evaluation and management by telemedicine and the availability of in-person appointments. The patient expressed understanding and agreed to proceed.   Female with f/u OSA, morbid obesity, right foot neuropathy, hypertension, fibromyalgia, depression, gout, CKD, degenerative disc disease-lumbar, bilateral knee surgeries for torn mensici, bilateral shoulder surgeries for rotator cuff presents with bilateral L > R knee pain and wound-related pain. She presents for follow-up today regarding her food allergies  1) Back pain -she has been having back pain -she was having pain in her back before the stroke as well -she does not want to go back to Dr. Barbarann. -XR ordered -has not been able to pick up Journavx  yet as she has no transportation  2) Knee pain: -she had relief from synvisc -she has terrible pain during the night -patches and the gel do not help.  Initially stated: Started ~1990. After a fall on her knees.  Progressively getting worse.  Steroid injections improve the pain along with brace.  Ambulation exacerbates the pain.  Moving after prolonged postures exacerbates the pain.  Achy.  Radiates down leg.  Intermittent. Denies associated weakness, numbness.  Tylenol . 1 fall summer of 2021 falling backward in chair.  Pain limits ambulation.  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 She has had bilateral L4-5 lumbar epidural steroid injections.  She has also had facet injections.  She has had an MRI in 2018 showing mild bilateral recess stenosis at L4-5 and central disc protrusion at L5-S1 with mass-effect  on ventral thecal sac and?  Irritation of S1 nerve roots.  She had a left knee steroid injection on 03/18/2020 with Ortho.  She is seen rheumatology as well.  No reviewed from rheumatology, orthopedic surgeon, physiatrist, neurology-plan for knee replacement in December 2022. She does note that she is improving with weight loss and dietary changes.   3) Hand pain -spiked since she has been off Celebrex due to her kidney injury -she was recommended to take Qunidine but she can't take because of the antibiotics which she is on for a year due to her wound in her back.  -percocet does not help with the pain as much as the hydrocodone .   4) AKI -discussed that this was elevated on recent labs  5) Depression:  -she feels miserable.  -she was started on Cymbalta . This did help with the pain in her legs.  -she is not sure if she needs to see anyone for grief  6) Insomnia: -not sleeping well.  -she cannot sleep without Lunesta  and is having an issue with this for her insurance -she also takes Amitriptyline , Lyrica , and Cymbalta - when she takes all these she does not get up until 11am the next day.  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  Since last visit, she states she has had great improvement with pool therapy. She states she is hungry. She had good benefits with Lyrica . She has not seen Nephro yet. She is awaiting CPAP.  She has bad  fibromyalgia flares when the weather changes- especially when it rains. She was started on Neurontin by Dr. Tobie and it does help on a daily basis. She had relief with Celebrex well before but had to stop due to CKD. She has never tried steroids.   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   Situational depression: She is going through changes with her family. They are all living in her house and everyone is arguing. She does not want to get herself worked up due to her hypertension. Her family brings her in a lot. Her husband says why don't you say anything. She felt a car coming across her yesterday and she screamed. She feels overwhelmed. Her mother may have undiagnosed mental illness. Her mom called th police on her husband.   Severe arthritis and bulging discs: -she has a lot of pain and nothing helps it. If she sits too long it hurts and if she walks to long it hurts -Lyrica  and steroids help her pain -she is planing to have surgery.   Weight gain  Not voiding as much as she used to -check Creatinine today.    Pain Inventory Average Pain 9 Pain Right Now 8 My pain is intermittent, burning, dull, and tingling, stabbing  In the last 24 hours, has pain interfered with the following? General activity 8 Relation with others 9 Enjoyment of life 10 What TIME of day is your pain at its worst? morning , daytime, and evening Sleep (in general) Fair  Pain is worse with: walking, bending, sitting, standing, and some activites Pain improves with: heat/ice and medication Relief from Meds: 7   Family History  Problem Relation Age of Onset   Hypothyroidism Mother    Hyperlipidemia Mother    Miscarriages / [place] Mother    Hyperlipidemia Other    Kidney failure Father    Dementia Father    Gout Father    Arthritis Father    Prostate cancer Father    Heart disease Father    Hypertension Father    Kidney disease Father    Stroke Father    Social History   Socioeconomic History    Marital status: Married    Spouse name: Not on file   Number of children: 3   Years of education: Not on file   Highest education level: Bachelor's degree (e.g., BA, AB, BS)  Occupational History   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     Social  Drivers of Corporate investment banker Strain: Low Risk  (07/25/2023)   Overall Financial Resource Strain (CARDIA)    Difficulty of Paying Living Expenses: Not hard at all  Food Insecurity: No Food Insecurity (08/18/2023)   Hunger Vital Sign    Worried About Running Out of Food in the Last Year: Never true    Ran Out of Food in the Last Year: Never true  Transportation Needs: No Transportation Needs (08/18/2023)   PRAPARE - Administrator, Civil Service (Medical): No    Lack of Transportation (Non-Medical): No  Physical Activity: Inactive (07/25/2023)   Exercise Vital Sign    Days of Exercise per Week: 0 days    Minutes of Exercise per Session: 20 min  Stress: Stress Concern Present (07/25/2023)   [employer] of Occupational Health - Occupational Stress Questionnaire    Feeling of Stress : Rather much  Social Connections: Moderately Integrated (08/18/2023)   Social Connection and Isolation Panel    Frequency of Communication with Friends and Family: Three times a week    Frequency of Social Gatherings with Friends and Family: Twice a week    Attends Religious Services: Never    Database administrator or Organizations: Yes    Attends Banker Meetings: Never    Marital  Status: Married  Recent Concern: Social Connections - Moderately Isolated (07/25/2023)   Social Connection and Isolation Panel    Frequency of Communication with Friends and Family: Three times a week    Frequency of Social Gatherings with Friends and Family: Once a week    Attends Religious Services: Never    Database administrator or Organizations: No    Attends Banker Meetings: Never    Marital Status: Married   Past Surgical History:  Procedure Laterality Date   APPLICATION OF WOUND VAC N/A 03/25/2021   Procedure: APPLICATION OF WOUND VAC;  Surgeon: Barbarann Oneil BROCKS, MD;  Location: [facility] OR;  Service: Orthopedics;  Laterality: N/A;   APPLICATION OF WOUND VAC N/A 03/30/2021   Procedure: WOUND VAC CHANGE 12x6x5;  Surgeon: Barbarann Oneil BROCKS, MD;  Location: [facility] OR;  Service: Orthopedics;  Laterality: N/A;   INCISION AND DRAINAGE OF WOUND N/A 03/25/2021   Procedure: LUMBAR POST OP INCISION IRRIGATION;  Surgeon: Barbarann Oneil BROCKS, MD;  Location: [facility] OR;  Service: Orthopedics;  Laterality: N/A;   JOINT REPLACEMENT     KNEE ARTHROSCOPY     LOOP RECORDER INSERTION N/A 08/23/2023   Procedure: LOOP RECORDER INSERTION;  Surgeon: Nancey Eulas BRAVO, MD;  Location: [facility];  Service: Cardiovascular;  Laterality: N/A;   LUMBAR WOUND DEBRIDEMENT N/A 03/30/2021   Procedure: REPEAT LUMBAR WOUND DEBRIDEMENT;  Surgeon: Barbarann Oneil BROCKS, MD;  Location: [facility];  Service: Orthopedics;  Laterality: N/A;   right rotator cuff     ROTATOR CUFF REPAIR Left    TOTAL KNEE ARTHROPLASTY Left 09/08/2020   Procedure: LEFT TOTAL KNEE ARTHROPLASTY;  Surgeon: Jerri Kay HERO, MD;  Location: [facility];  Service: Orthopedics;  Laterality: Left;   TUBAL LIGATION     Past Medical History:  Diagnosis Date   Allergy 1976   Tramadol  NSAIDS   Chronic kidney disease    Depression    Fibromyalgia    Gout    HTN (hypertension)    Hyperlipidemia 09/01/22   Insomnia    Morbid obesity (HCC)    Non-recurrent acute suppurative otitis  media of left ear without spontaneous rupture of  tympanic membrane 04/30/2022   Osteoarthritis    Palpitation    Sleep apnea    Stroke (HCC) 08/23/2023   There were no vitals taken for this visit.  Opioid Risk Score:   Fall Risk Score:  `1  Depression screen [facility] 2/9     09/19/2023    3:44 PM 06/20/2023    2:43 PM 04/25/2023    8:16 AM 01/12/2023    8:21 AM 12/13/2022    1:47 PM 11/15/2022   11:12 AM 11/05/2022    1:10 PM  Depression screen PHQ 2/9  Decreased Interest 0 0 1 0 0 0 0  Down, Depressed, Hopeless 0 0 3 0 0 0 0  PHQ - 2 Score 0 0 4 0 0 0 0  Altered sleeping   3      Tired, decreased energy   3      Change in appetite   1      Feeling bad or failure about yourself    1      Trouble concentrating   1      Moving slowly or fidgety/restless   0      Suicidal thoughts   0      PHQ-9 Score   13      Difficult doing work/chores   Somewhat difficult       Review of Systems  Constitutional: Negative.   HENT: Negative.    Eyes: Negative.   Respiratory: Negative.    Cardiovascular: Negative.   Gastrointestinal: Negative.   Endocrine: Negative.   Genitourinary: Negative.   Musculoskeletal:  Positive for arthralgias, back pain and gait problem. Negative for joint swelling, myalgias, neck pain and neck stiffness.       Pain in both legs  Allergic/Immunologic: Negative.   Hematological: Negative.   All other systems reviewed and are negative.     Objective:   Physical Exam  PRIOR EXAM: Gen: no distress, normal appearing, weight 200 lbs, BMI 34.57, BP 135/77 HEENT: oral mucosa pink and moist, NCAT Cardio: Reg rate Chest: normal effort, normal rate of breathing Abd: soft, non-distended Ext: no edema Psych: pleasant, normal affect Skin: no open lesions on right foot Neuro: Alert and oriented x3    Assessment & Plan:  Female with past medical history/past surgical history of OSA, morbid obesity, hypertension, fibromyalgia, depression, gout, CKD, degenerative disc  disease-lumbar, bilateral knee surgeries for torn mensici, bilateral shoulder surgeries for rotator cuff presents with bilateral L > R knee pain.   1. Bilateral knee OA - endstage  Discussed plan for knee surgery  -discussed that weight loss will help  Patient was supposed to have knee replacements last year, but due to insurance changed to summer 2022  Endstage OA in b/l knee per Ortho, no films available  No benefit with Heat/Cold, PT (~2019), Robaxin , Flexaril, Tizanidine   Continue bracing prn (limit on right knee - educated)  Continue Voltaren gel  Lidocaine  5% patch ordered  -Provided with a pain relief journal and discussed that it contains foods and lifestyle tips to naturally help to improve pain. Discussed that these lifestyle strategies are also very good for health unlike some medications which can have negative side effects. Discussed that the act of keeping a journal can be therapeutic and helpful to realize patterns what helps to trigger and alleviate pain.    Decreased Cymbalta  to 20mg  daily.   Patient states main goal is to ambulate  Wants to buy a pool - afraid of public locations due  to CoVid  Recommended follow up with Nephro regarding Chondroitin sulfate, appointment next month  Tolerating mild exercise - limited by back   Continue antiinflammatory diet -Discussed following foods that may reduce pain: 1) Ginger (especially studied for arthritis)- reduce leukotriene production to decrease inflammation 2) Blueberries- high in phytonutrients that decrease inflammation 3) Salmon- marine omega-3s reduce joint swelling and pain 4) Pumpkin seeds- reduce inflammation 5) dark chocolate- reduces inflammation 6) turmeric- reduces inflammation 7) tart cherries - reduce pain and stiffness 8) extra virgin olive oil - its compound olecanthal helps to block prostaglandins  9) chili peppers- can be eaten or applied topically via capsaicin  10) mint- helpful for headache, muscle aches,  joint pain, and itching 11) garlic- reduces inflammation  Link to further information on diet for chronic pain: http://www.bray.com/   2. Sleep disturbance  Decrease Lunesta  to 1mg  HS  Continue to await CPAP -Try to go outside near sunrise -Get exercise during the day.  -Turn off all devices an hour before bedtime.  -Teas that can benefit: chamomile, valerian root, Brahmi (Bacopa) -Can consider over the counter melatonin or magnesium glycinate (latter can also help with constipation) -Pistachios naturally increase the production of melatonin  -start amitriptyline  10mg  HS    3. Morbid Obesity  Continue follow up with dietitian  Encouraged weight loss  4. Myalgia   Will consider trigger point injections  5. Fibromyalgia  Prescribed aquatic therapy - had great benefit  Encouraged ROM, stretching  Continue Cymbalta   Decrease Lyrica  to 50mg  TID  6) Spinal surgical hardware infection -discuseed patient's lumbar spine MRI, discussed that there is presence of lumbar disc bulges, facet arthropathy, discussed that she has not had benefit from medial branch blocks and radiofrequency ablation but would be interested in retrying this, discussed Journavx  and that I could mail her a coupon for this -discussed that she contacted Dr. Dennise from infection disease -discussed plan to wean off Percocet -will continue on one year of antibiotics -Discussed that the hydrocodone  was helping with wound related pain which is still horrible. Will get UDS and pain contact today.  -discussed maximizing tylenol  which she is already doing  -avoid NSAIDs due to only one kidney present -discussed wean to one, then no opioids after wound vac removal -discussed that she is off suppression antibiotics right now -she has felt increased pan recently, CBC ordered -discussed that there is no infection anywhere -discussed ESR and CRP are stable,  that she was unable to get Journavx  and Hydrocodone  due to need for prior authorizations, that she plans to get lower back XR soon  7) depression -discussed taking amitriptyline  earlier in the evening -continue to follow-up with neuropsych -discussed family stressors -recommended eating saurkraut -discussed kombucha, yogurt, and kefir.  -Discussed her desire to return to work.  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 Discussed that this is a capsaicin  patch, stronger than capsaicin  cream. Discussed that it is currently approved for diabetic peripheral neuropathy and post-herpetic neuralgia, but that it has also shown benefit in treating other forms of neuropathy. Provided patient with link to site to learn more about the patch: https://www.qutenza .com/. Discussed that the patch would be placed in office and benefits usually last 3 months. Discussed that unintended exposure to capsaicin  can  cause severe irritation of eyes, mucous membranes, respiratory tract, and skin, but that Qutenza  is a local treatment and does not have the systemic side effects of other nerve medications. Discussed that there may be pain, itching, erythema, and decreased sensory function associated with the application of Qutenza . Side effects usually subside within 1 week. A cold pack of analgesic medications can help with these side effects. Blood pressure can also be increased due to pain associated with administration of the patch.   Back brace ordered via [employer] and advised to use when bending, discussed that it is better to use the brace that the medicine  9) Intercostal neuralgia: -lidocaine  patch  prescribed -discussed this could be secondary to her prior rib fractures  10) HLD: -discussed that she failed two statins so she qualifies for injectables  11) CKD: -discussed that creatinine is elevated, encouraged hydration  12 minutes spent in review of patient's lumbar spine MRI, discussed that there is presence of lumbar disc bulges, facet arthropathy, discussed that she has not had benefit from medial branch blocks and radiofrequency ablation but would be interested in retrying this, discussed Journavx  and that I could mail her a coupon for this

## 2023-10-04 ENCOUNTER — Ambulatory Visit: Admitting: Rehabilitation

## 2023-10-04 ENCOUNTER — Encounter: Payer: Self-pay | Admitting: Physical Medicine and Rehabilitation

## 2023-10-05 ENCOUNTER — Other Ambulatory Visit: Payer: Self-pay | Admitting: Physical Medicine and Rehabilitation

## 2023-10-05 MED ORDER — JOURNAVX 50 MG PO TABS: 1.0000 | ORAL_TABLET | Freq: Every day | ORAL | 4 refills | Status: DC | PRN

## 2023-10-05 MED ORDER — PROPRANOLOL HCL 10 MG PO TABS: 10.0000 mg | ORAL_TABLET | Freq: Every day | ORAL | 3 refills | Status: AC

## 2023-10-06 ENCOUNTER — Ambulatory Visit: Attending: Physical Medicine and Rehabilitation

## 2023-10-06 DIAGNOSIS — G8929 Other chronic pain: Secondary | ICD-10-CM | POA: Diagnosis present

## 2023-10-06 DIAGNOSIS — R2681 Unsteadiness on feet: Secondary | ICD-10-CM | POA: Diagnosis present

## 2023-10-06 DIAGNOSIS — M5459 Other low back pain: Secondary | ICD-10-CM | POA: Insufficient documentation

## 2023-10-06 DIAGNOSIS — M25561 Pain in right knee: Secondary | ICD-10-CM | POA: Insufficient documentation

## 2023-10-06 DIAGNOSIS — M6281 Muscle weakness (generalized): Secondary | ICD-10-CM | POA: Insufficient documentation

## 2023-10-06 NOTE — Therapy (Signed)
 OUTPATIENT PHYSICAL THERAPY NEURO TREATMENT   Patient Name: Amy Hooper MRN: [id number] DOB:28-Mar-1950, 72 y.o., female Today's Date: 10/06/2023   PCP: Oris Camie BRAVO, NP REFERRING PROVIDER: Lorilee Sven SQUIBB, MD  END OF SESSION:  PT End of Session - 10/06/23 1024     Visit Number 3    Number of Visits 13    Date for PT Re-Evaluation 11/08/23    Authorization Type Medicare    Progress Note Due on Visit 10    PT Start Time 1020    PT Stop Time 1100    PT Time Calculation (min) 40 min    Activity Tolerance Patient tolerated treatment well    Behavior During Therapy [facility] for tasks assessed/performed           Past Medical History:  Diagnosis Date   Allergy 1976   Tramadol  NSAIDS   Chronic kidney disease    Depression    Fibromyalgia    Gout    HTN (hypertension)    Hyperlipidemia 09/01/22   Insomnia    Morbid obesity (HCC)    Non-recurrent acute suppurative otitis media of left ear without spontaneous rupture of tympanic membrane 04/30/2022   Osteoarthritis    Palpitation    Sleep apnea    Stroke (HCC) 08/23/2023   Past Surgical History:  Procedure Laterality Date   APPLICATION OF WOUND VAC N/A 03/25/2021   Procedure: APPLICATION OF WOUND VAC;  Surgeon: Barbarann Oneil BROCKS, MD;  Location: [facility] OR;  Service: Orthopedics;  Laterality: N/A;   APPLICATION OF WOUND VAC N/A 03/30/2021   Procedure: WOUND VAC CHANGE 12x6x5;  Surgeon: Barbarann Oneil BROCKS, MD;  Location: [facility] OR;  Service: Orthopedics;  Laterality: N/A;   INCISION AND DRAINAGE OF WOUND N/A 03/25/2021   Procedure: LUMBAR POST OP INCISION IRRIGATION;  Surgeon: Barbarann Oneil BROCKS, MD;  Location: [facility] OR;  Service: Orthopedics;  Laterality: N/A;   JOINT REPLACEMENT     KNEE ARTHROSCOPY     LOOP RECORDER INSERTION N/A 08/23/2023   Procedure: LOOP RECORDER INSERTION;  Surgeon: Nancey Eulas BRAVO, MD;  Location: [facility] INVASIVE CV LAB;  Service: Cardiovascular;  Laterality: N/A;   LUMBAR WOUND DEBRIDEMENT N/A 03/30/2021   Procedure:  REPEAT LUMBAR WOUND DEBRIDEMENT;  Surgeon: Barbarann Oneil BROCKS, MD;  Location: [facility] OR;  Service: Orthopedics;  Laterality: N/A;   right rotator cuff     ROTATOR CUFF REPAIR Left    TOTAL KNEE ARTHROPLASTY Left 09/08/2020   Procedure: LEFT TOTAL KNEE ARTHROPLASTY;  Surgeon: Jerri Kay HERO, MD;  Location: [facility] OR;  Service: Orthopedics;  Laterality: Left;   TUBAL LIGATION     Patient Active Problem List   Diagnosis Date Noted   Acute ischemic left ACA stroke (HCC) 08/23/2023   CVA (cerebrovascular accident) (HCC) 08/18/2023   Premature atrial complexes 08/18/2023   Obesity (BMI 30-39.9) 08/18/2023   Nocturnal leg cramps 07/27/2023   PAC (premature atrial contraction) 12/20/2022   Forearm tendonitis 07/23/2022   Rash and nonspecific skin eruption 07/23/2022   Asymptomatic bradycardia 07/23/2022   Rheumatoid arthritis with rheumatoid factor of multiple sites without organ or systems involvement (HCC) 04/30/2022   Osteopenia 03/03/2022   Degenerative spondylolisthesis 02/19/2021   Status post total left knee replacement 09/08/2020   OSA (obstructive sleep apnea) 05/19/2020   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/17/2020   Chronic pain syndrome 03/03/2020   S/P lumbar fusion 02/05/2020   Vitamin D  deficiency 01/09/2020   Hyperlipidemia 01/09/2020   CKD (chronic kidney disease) stage 3, GFR  30-59 ml/min (HCC) 01/09/2020   Essential hypertension    Morbid obesity (HCC)    Gout    Fibromyalgia    Depression    Insomnia    Chronically low serum potassium 04/22/2014   Environmental and seasonal allergies 04/22/2014   Palpitations 04/22/2014    ONSET DATE: 08/23/23  REFERRING DIAG:  I63.9 (ICD-10-CM) - Cerebrovascular accident (CVA), unspecified mechanism (HCC)    THERAPY DIAG:  Muscle weakness-general  Unsteadiness on feet  Other low back pain  Muscle weakness (generalized)  Chronic pain of right knee  Rationale for Evaluation and Treatment:  Rehabilitation  SUBJECTIVE:                                                                                                                                                                                             SUBJECTIVE STATEMENT: Doing ok, no back pain right now, the right knee is about 7/10 will be going to see ortho soon for right knee   Pt accompanied by: self  PERTINENT HISTORY: istory of HTN, T2DM, OSA, CKD 3B, fibromyalgia who was admitted on 08/18/2023 with reports of funny feeling oral, progressive RUE weakness at night prior to admission the next day with RUE weakness on awakening. 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 MRI brain showed area of small acute ischemia within posterior paramedian left frontal lobe and multifocal hyperintense T2 weighted signals within white matter. Dr. Jerri felt the stroke was of unclear etiology question cardiac source and loop recorder was placed at discharge Hx of lumbar surgery (fusion?) with infection complications.  PAIN:  Are you having pain? Yes: NPRS scale: 0 (back); 7 (R knee) Pain location: central lumbar; right knee medial compartment Pain description: sharp, burn, pain Aggravating factors: standing, upright Relieving factors: rest,   PRECAUTIONS: Fall  RED FLAGS: None   WEIGHT BEARING RESTRICTIONS: No  FALLS: Has patient fallen in last 6 months? No  LIVING ENVIRONMENT: Lives with:  Lives in: House/apartment Stairs: 7 steps to enter, ground floor Has following equipment at home: Single point cane  PLOF: Independent, Independent with basic ADLs, and Independent with household mobility without device  PATIENT GOALS:   OBJECTIVE:    TODAY'S TREATMENT: 10/06/23 Activity Comments  NU-step level 5 x 6 min   HEP review    SLR 2x10   Suitcase carry 8#   Sumo deadlift 2x10 8# w/ 4 box, then ground level  Pt education in lumbar mechanics and functional lift movements         TODAY'S TREATMENT: 09/29/23 Activity Comments  Nustep L4 x 6 min UEs/LEs  Cueing for larger amplitude movements   review of HEP: supine posterior pelvic tilt 10x hooklying isometric hip flexion 10x5 supine quad set 5x3 Good carryover and tolerance. Use of towel roll for quad sets  Vitals check 138/71 mmHg  romberg Good stability  romberg EC Mild imbalance   alt side step Mild hip instability  alt backwards step  Mild hip instability        HOME EXERCISE PROGRAM Last updated: 09/29/23 Access Code: [facility] URL: [email or web address] Date: 09/29/2023 Prepared by: [facility] - Outpatient  Rehab - [facility]  Exercises - Supine Posterior Pelvic Tilt  - 2-3 x weekly - 3 sets - 10 reps - Hooklying Isometric Hip Flexion  - 1 x daily - 2-3 x weekly - 3 sets - 10 reps - Supine Quad Set  - 1 x daily - 2-3 x weekly - 3 sets - 10 reps - Alternating Backward Step  - 1 x daily - 5 x weekly - 2 sets - 10 reps - Side Stepping with Unilateral Counter Support  - 1 x daily - 5 x weekly - 2 sets - 10 reps - Active Straight Leg Raise with Quad Set  - 1 x daily - 7 x weekly - 3 sets - 10 reps - Kettlebell Suitcase Carry  - 1 x daily - 7 x weekly - 3 sets - 10 reps - Sumo Squat with Dumbbell  - 1 x daily - 7 x weekly - 1-3 sets - 10 reps   PATIENT EDUCATION: Education details: discussed pt's aquatic therapy appointments which were cancelled by the provider- patient reports that she specifically sought out aquatic therapy and would like to be seen in the pool; HEP update Person educated: Patient Education method: Explanation Education comprehension: verbalized understanding     Note: Objective measures were completed at Evaluation unless otherwise noted.  DIAGNOSTIC FINDINGS:   COGNITION: Overall cognitive status: Within functional limits for tasks assessed   SENSATION: Numbness along right lateral foot (since back surgery)  COORDINATION:  WNL  MUSCLE TONE:  WNL      POSTURE: increased lumbar lordosis  LOWER EXTREMITY ROM:     Active  Right Eval Left Eval  Hip flexion 100+ 100+  Hip extension    Hip abduction    Hip adduction    Hip internal rotation    Hip external rotation    Knee flexion 115 115  Knee extension -5 0  Ankle dorsiflexion 7 14  Ankle plantarflexion    Ankle inversion    Ankle eversion     (Blank rows = not tested)  LOWER EXTREMITY MMT:  resisted tests in sitting  MMT Right Eval Left Eval  Hip flexion 4 5  Hip extension    Hip abduction 4 4  Hip adduction    Hip internal rotation    Hip external rotation    Knee flexion 4 5  Knee extension 4 5  Ankle dorsiflexion 3+ 4  Ankle plantarflexion 2+ 3-  Ankle inversion    Ankle eversion    (Blank rows = not tested)  Trunk flexion: 2+/5 Trunk extension: NT Single leg stance LLE: 10+ sec; RLE: 5-8 sec  BED MOBILITY:  indep  TRANSFERS: Sit-stand and chair-chair independent Floor to chair/stand: DNT   CURB:  Findings: modified indep  STAIRS: Findings: Comments: modified indep step-to pattern GAIT: Findings: Distance walked:   and Comments: mildly antalgic RLE Independent-mod I level surfaces; CGA uneven FUNCTIONAL TESTS:  5 times sit to stand: 27 sec  2 minute walk test:  10 meter walk test: 15.4 sec = 2.12 ft/sec  M-CTSIB  Condition 1: Firm Surface, EO 30 Sec, Normal Sway  Condition 2: Firm Surface, EC 30 Sec, Normal Sway  Condition 3: Foam Surface, EO  Sec,  Sway  Condition 4: Foam Surface, EC  Sec,  Sway    Dynamic Gait Index: Dynamic Gait Index  Mark the lowest level that applies.   Date Performed 6/24  Gait level surface (2) Mild Impairment: Walks 20', uses AD, slower speed, mild gait deviations  2. Change in gait speed (3) Normal: Able to smoothly change walking speed without loss of balance or gait deviation. Shows a significant difference in walking speeds between normal, fast and slow speeds  3. Gait with horizontal head  turns (2) Mild Impairment: Performs head turns smoothly with slight change in gait velocity, i.e., minor disruption to smooth gait path or uses walking aid  4. Gait with vertical head turns (2) Mild Impairment: Performs head turns smoothly with slight change in gait velocity, i.e., minor disruption to smooth gait path or uses walking aid  5. Gait and pivot turn (2) Mild Impairment: Pivot turns safely in > 3 seconds and stops with no loss of balance  6. Step over obstacle (2) Mild Impairment: Is able to step over box, but must slow down and adjust steps to clear box safely  7. Step around obstacle (3) Normal: Is able to walk around cones safely without changing gait speed; no evidence of imbalance  8. Steps (1) Moderate Impairment: Two feet to a stair, must use rail  Total score 17/24    Score Interpretation: Score of <19 indicates high risk of falls.  Minimally Clinically Important Difference (MCID):  =DGI scores of<21/24 = 1.80 points DGI scores of >21/24 = 0.60 points   Miles T, Inbar-Borovsky N, Brozgol M, Giladi N, [place] JM. The Dynamic Gait Index in healthy older adults: the role of stair climbing, fear of falling and gender. Gait Posture. 2009 Feb;29(2):237-41. doi: 10.1016/j.gaitpost.2008.08.013. Epub 2008 Oct 8. PMID: [id number]; PMCID: [id number].  Pardasaney, MYRTIS LOIS Bonus, GEANNIE POUR., et al. (2012). Sensitivity to change and responsiveness of four balance measures for community-dwelling older adults. Physical therapy 92(3): 388-397.                                                                                                                                    TREATMENT DATE: 09/13/23    PATIENT EDUCATION: Education details: assessment details, rationale of PT intervention, benefit of aquatic PT, stroke risk factors: modifiable vs non-modifiable Person educated: Patient Education method: Explanation and Handouts Education comprehension: verbalized understanding  HOME  EXERCISE PROGRAM: Access Code: [email or web address] URL: [email or web address] Date: 09/13/2023 Prepared by: Kelly Inanna Telford  Exercises - Supine Posterior Pelvic Tilt  - 2-3 x weekly - 3 sets - 10 reps - Hooklying Isometric Hip Flexion  - 1 x daily - 2-3 x weekly -  3 sets - 10 reps - Supine Quad Set  - 1 x daily - 2-3 x weekly - 3 sets - 10 reps  GOALS: Goals reviewed with patient? Yes  SHORT TERM GOALS: Target date: 10/11/2023    Patient will be independent in HEP to improve functional outcomes Baseline: Goal status: IN PROGRESS   2.  Demo improved BLE strength and balance per time < 20 sec 5xSTS test Baseline: 27 sec Goal status:  IN PROGRESS   3.  Independent ambulation uneven ground to return to gardening activities Baseline: CGA Goal status:  IN PROGRESS     LONG TERM GOALS: Target date: 11/08/2023    Independent w/ advanced HEP to include land and aquatic-based elements to prepare for D/C Baseline:  Goal status:  IN PROGRESS   2.  Modified independent floor/ground to stand to improve mobility and safety with gardening activities Baseline: NT Goal status:  IN PROGRESS   3.  Demo low risk for falls per score 20/24 Dynamic Gait Index to improve safety with mobility Baseline:  Goal status:  IN PROGRESS   4.  Improve gait speed to 2.8 ft/sec to improve efficiency community ambulation Baseline: 2.12 no AD Goal status:  IN PROGRESS   5.  Improve RLE strength to 5/5 for improved single limb support/stability Baseline: 4/5 Goal status:  IN PROGRESS   6.  Back/right knee pain not to exceed 3/10 with exercise/functional mobility/activities Baseline: 6/10 Goal status:  IN PROGRESS   ASSESSMENT:  CLINICAL IMPRESSION: Reports back is feeling ok today w/ right knee being chief complaint. NU-step to improve endurance and activity tolerance followed by HEP review for lumbar and knee stability w/ addition of SLR. Performance and review for functional lift movements to  improve lumbar stabilization and reduce risk for strain progressing from modified position (weight elevated) to ground level lift w/ good carryover.  Pt would benefit from ongoing therapy sessions and intend to develop comprehensive HEP for land and aquatic based activities for max carryover at D/C  OBJECTIVE IMPAIRMENTS: Abnormal gait, decreased activity tolerance, decreased balance, decreased mobility, difficulty walking, decreased strength, improper body mechanics, and pain.   ACTIVITY LIMITATIONS: carrying, lifting, bending, stairs, transfers, and locomotion level  PARTICIPATION LIMITATIONS: meal prep, cleaning, laundry, community activity, occupation, and yard work  PERSONAL FACTORS: Age, Time since onset of injury/illness/exacerbation, and 1-2 comorbidities: PMH are also affecting patient's functional outcome.   REHAB POTENTIAL: Excellent  CLINICAL DECISION MAKING: Evolving/moderate complexity  EVALUATION COMPLEXITY: Moderate  PLAN:  PT FREQUENCY: 1-2x/week  PT DURATION: 8 weeks  PLANNED INTERVENTIONS: 97750- Physical Performance Testing, 97110-Therapeutic exercises, 97530- Therapeutic activity, [id number]- Neuromuscular re-education, 97535- Self Care, [id number]- Manual therapy, [id number]- Gait training, [phone]- Aquatic Therapy, [phone]- Electrical stimulation (unattended), and 20560 (1-2 muscles), 20561 (3+ muscles)- Dry Needling  PLAN FOR NEXT SESSION: discuss POC- pt unable to be seen in pool until 8/7. This is a priority for her, thus may consider dropping down to every other week on land and have her work on [employer]. Will likely need to extend POC to allow for more aquatic visits.  HEP review/progressions, balance+strength activities, floor to stand/sit   AQUATICS: Frequency: 1 Duration: 8 Special Instruction: chronic knee and LBP- pt with good experience with aquatic therapy in the past    11:09 AM, 10/06/23 M. Kelly Evie Croston, PT, DPT Physical Therapist- [place] Office Number:  [phone]

## 2023-10-11 ENCOUNTER — Other Ambulatory Visit: Payer: Self-pay

## 2023-10-11 ENCOUNTER — Ambulatory Visit: Admitting: Rehabilitation

## 2023-10-11 ENCOUNTER — Ambulatory Visit: Admitting: Physical Therapy

## 2023-10-11 DIAGNOSIS — M1711 Unilateral primary osteoarthritis, right knee: Secondary | ICD-10-CM

## 2023-10-11 DIAGNOSIS — R2681 Unsteadiness on feet: Secondary | ICD-10-CM

## 2023-10-11 DIAGNOSIS — M6281 Muscle weakness (generalized): Secondary | ICD-10-CM | POA: Diagnosis not present

## 2023-10-11 NOTE — Therapy (Signed)
 OUTPATIENT PHYSICAL THERAPY NEURO TREATMENT   Patient Name: Amy Hooper MRN: [id number] DOB:30-Mar-1950, 73 y.o., female Today's Date: 10/11/2023   PCP: Oris Camie BRAVO, NP REFERRING PROVIDER: Lorilee Sven SQUIBB, MD  END OF SESSION:     Past Medical History:  Diagnosis Date   Allergy 1976   Tramadol  NSAIDS   Chronic kidney disease    Depression    Fibromyalgia    Gout    HTN (hypertension)    Hyperlipidemia 09/01/22   Insomnia    Morbid obesity (HCC)    Non-recurrent acute suppurative otitis media of left ear without spontaneous rupture of tympanic membrane 04/30/2022   Osteoarthritis    Palpitation    Sleep apnea    Stroke (HCC) 08/23/2023   Past Surgical History:  Procedure Laterality Date   APPLICATION OF WOUND VAC N/A 03/25/2021   Procedure: APPLICATION OF WOUND VAC;  Surgeon: Barbarann Oneil BROCKS, MD;  Location: [facility];  Service: Orthopedics;  Laterality: N/A;   APPLICATION OF WOUND VAC N/A 03/30/2021   Procedure: WOUND VAC CHANGE 12x6x5;  Surgeon: Barbarann Oneil BROCKS, MD;  Location: [facility] OR;  Service: Orthopedics;  Laterality: N/A;   INCISION AND DRAINAGE OF WOUND N/A 03/25/2021   Procedure: LUMBAR POST OP INCISION IRRIGATION;  Surgeon: Barbarann Oneil BROCKS, MD;  Location: [facility] OR;  Service: Orthopedics;  Laterality: N/A;   JOINT REPLACEMENT     KNEE ARTHROSCOPY     LOOP RECORDER INSERTION N/A 08/23/2023   Procedure: LOOP RECORDER INSERTION;  Surgeon: Nancey Eulas BRAVO, MD;  Location: [facility];  Service: Cardiovascular;  Laterality: N/A;   LUMBAR WOUND DEBRIDEMENT N/A 03/30/2021   Procedure: REPEAT LUMBAR WOUND DEBRIDEMENT;  Surgeon: Barbarann Oneil BROCKS, MD;  Location: [facility];  Service: Orthopedics;  Laterality: N/A;   right rotator cuff     ROTATOR CUFF REPAIR Left    TOTAL KNEE ARTHROPLASTY Left 09/08/2020   Procedure: LEFT TOTAL KNEE ARTHROPLASTY;  Surgeon: Jerri Kay HERO, MD;  Location: [facility];  Service: Orthopedics;  Laterality: Left;   TUBAL LIGATION     Patient Active Problem  List   Diagnosis Date Noted   Acute ischemic left ACA stroke (HCC) 08/23/2023   CVA (cerebrovascular accident) (HCC) 08/18/2023   Premature atrial complexes 08/18/2023   Obesity (BMI 30-39.9) 08/18/2023   Nocturnal leg cramps 07/27/2023   PAC (premature atrial contraction) 12/20/2022   Forearm tendonitis 07/23/2022   Rash and nonspecific skin eruption 07/23/2022   Asymptomatic bradycardia 07/23/2022   Rheumatoid arthritis with rheumatoid factor of multiple sites without organ or systems involvement (HCC) 04/30/2022   Osteopenia 03/03/2022   Degenerative spondylolisthesis 02/19/2021   Status post total left knee replacement 09/08/2020   OSA (obstructive sleep apnea) 05/19/2020   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/17/2020   Chronic pain syndrome 03/03/2020   S/P lumbar fusion 02/05/2020   Vitamin D  deficiency 01/09/2020   Hyperlipidemia 01/09/2020   CKD (chronic kidney disease) stage 3, GFR 30-59 ml/min (HCC) 01/09/2020   Essential hypertension    Morbid obesity (HCC)    Gout    Fibromyalgia    Depression    Insomnia    Chronically low serum potassium 04/22/2014   Environmental and seasonal allergies 04/22/2014   Palpitations 04/22/2014    ONSET DATE: 08/23/23  REFERRING DIAG:  I63.9 (ICD-10-CM) - Cerebrovascular accident (CVA), unspecified mechanism (HCC)    THERAPY DIAG:  No diagnosis found.  Rationale for Evaluation and Treatment: Rehabilitation  SUBJECTIVE:  SUBJECTIVE STATEMENT: Doing ok, no back pain right now, the right knee is about 7/10 will be going to see ortho soon for right knee   Pt accompanied by: self  PERTINENT HISTORY: istory of HTN, T2DM, OSA, CKD 3B, fibromyalgia who was admitted on 08/18/2023 with reports of funny feeling oral, progressive RUE  weakness at night prior to admission the next day with RUE weakness on awakening. 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 MRI brain showed area of small acute ischemia within posterior paramedian left frontal lobe and multifocal hyperintense T2 weighted signals within white matter. Dr. Jerri felt the stroke was of unclear etiology question cardiac source and loop recorder was placed at discharge Hx of lumbar surgery (fusion?) with infection complications.  PAIN:  Are you having pain? Yes: NPRS scale: 0 (back); 7 (R knee) Pain location: central lumbar; right knee medial compartment Pain description: sharp, burn, pain Aggravating factors: standing, upright Relieving factors: rest,   PRECAUTIONS: Fall  RED FLAGS: None   WEIGHT BEARING RESTRICTIONS: No  FALLS: Has patient fallen in last 6 months? No  LIVING ENVIRONMENT: Lives with:  Lives in: House/apartment Stairs: 7 steps to enter, ground floor Has following equipment at home: Single point cane  PLOF: Independent, Independent with basic ADLs, and Independent with household mobility without device  PATIENT GOALS:   OBJECTIVE:    TODAY'S TREATMENT: 10/06/23 Activity Comments  NU-step level 5 x 6 min   HEP review    SLR 2x10   Suitcase carry 8#   Sumo deadlift 2x10 8# w/ 4 box, then ground level  Pt education in lumbar mechanics and functional lift movements        TODAY'S TREATMENT: 09/29/23 Activity Comments  Nustep L4 x 6 min UEs/LEs Cueing for larger amplitude movements   review of HEP: supine posterior pelvic tilt 10x hooklying isometric hip flexion 10x5 supine quad set 5x3 Good carryover and tolerance. Use of towel roll for quad sets  Vitals check 138/71 mmHg  romberg Good stability  romberg EC Mild imbalance   alt side step Mild hip instability  alt backwards step  Mild hip instability        HOME EXERCISE PROGRAM Last updated: 09/29/23 Access Code: [facility] URL:  [email or web address] Date: 09/29/2023 Prepared by: [facility] - Outpatient  Rehab - [facility]  Exercises - Supine Posterior Pelvic Tilt  - 2-3 x weekly - 3 sets - 10 reps - Hooklying Isometric Hip Flexion  - 1 x daily - 2-3 x weekly - 3 sets - 10 reps - Supine Quad Set  - 1 x daily - 2-3 x weekly - 3 sets - 10 reps - Alternating Backward Step  - 1 x daily - 5 x weekly - 2 sets - 10 reps - Side Stepping with Unilateral Counter Support  - 1 x daily - 5 x weekly - 2 sets - 10 reps - Active Straight Leg Raise with Quad Set  - 1 x daily - 7 x weekly - 3 sets - 10 reps - Kettlebell Suitcase Carry  - 1 x daily - 7 x weekly - 3 sets - 10 reps - Sumo Squat with Dumbbell  - 1 x daily - 7 x weekly - 1-3 sets - 10 reps   PATIENT EDUCATION: Education details: discussed pt's aquatic therapy appointments which were cancelled by the provider- patient reports that she specifically sought out aquatic therapy and would like to be seen in the pool; HEP  update Person educated: Patient Education method: Explanation Education comprehension: verbalized understanding     Note: Objective measures were completed at Evaluation unless otherwise noted.  DIAGNOSTIC FINDINGS:   COGNITION: Overall cognitive status: Within functional limits for tasks assessed   SENSATION: Numbness along right lateral foot (since back surgery)  COORDINATION:  WNL  MUSCLE TONE: WNL      POSTURE: increased lumbar lordosis  LOWER EXTREMITY ROM:     Active  Right Eval Left Eval  Hip flexion 100+ 100+  Hip extension    Hip abduction    Hip adduction    Hip internal rotation    Hip external rotation    Knee flexion 115 115  Knee extension -5 0  Ankle dorsiflexion 7 14  Ankle plantarflexion    Ankle inversion    Ankle eversion     (Blank rows = not tested)  LOWER EXTREMITY MMT:  resisted tests in sitting  MMT Right Eval Left Eval  Hip flexion 4 5  Hip extension    Hip abduction 4 4   Hip adduction    Hip internal rotation    Hip external rotation    Knee flexion 4 5  Knee extension 4 5  Ankle dorsiflexion 3+ 4  Ankle plantarflexion 2+ 3-  Ankle inversion    Ankle eversion    (Blank rows = not tested)  Trunk flexion: 2+/5 Trunk extension: NT Single leg stance LLE: 10+ sec; RLE: 5-8 sec  BED MOBILITY:  indep  TRANSFERS: Sit-stand and chair-chair independent Floor to chair/stand: DNT   CURB:  Findings: modified indep  STAIRS: Findings: Comments: modified indep step-to pattern GAIT: Findings: Distance walked:   and Comments: mildly antalgic RLE Independent-mod I level surfaces; CGA uneven FUNCTIONAL TESTS:  5 times sit to stand: 27 sec 2 minute walk test:  10 meter walk test: 15.4 sec = 2.12 ft/sec  M-CTSIB  Condition 1: Firm Surface, EO 30 Sec, Normal Sway  Condition 2: Firm Surface, EC 30 Sec, Normal Sway  Condition 3: Foam Surface, EO  Sec,  Sway  Condition 4: Foam Surface, EC  Sec,  Sway    Dynamic Gait Index: Dynamic Gait Index  Mark the lowest level that applies.   Date Performed 6/24  Gait level surface (2) Mild Impairment: Walks 20', uses AD, slower speed, mild gait deviations  2. Change in gait speed (3) Normal: Able to smoothly change walking speed without loss of balance or gait deviation. Shows a significant difference in walking speeds between normal, fast and slow speeds  3. Gait with horizontal head turns (2) Mild Impairment: Performs head turns smoothly with slight change in gait velocity, i.e., minor disruption to smooth gait path or uses walking aid  4. Gait with vertical head turns (2) Mild Impairment: Performs head turns smoothly with slight change in gait velocity, i.e., minor disruption to smooth gait path or uses walking aid  5. Gait and pivot turn (2) Mild Impairment: Pivot turns safely in > 3 seconds and stops with no loss of balance  6. Step over obstacle (2) Mild Impairment: Is able to step over box, but must slow down  and adjust steps to clear box safely  7. Step around obstacle (3) Normal: Is able to walk around cones safely without changing gait speed; no evidence of imbalance  8. Steps (1) Moderate Impairment: Two feet to a stair, must use rail  Total score 17/24    Score Interpretation: Score of <19 indicates high risk of falls.  Minimally Clinically Important Difference (MCID):  =DGI scores of<21/24 = 1.80 points DGI scores of >21/24 = 0.60 points   Ellinwood T, Inbar-Borovsky N, Brozgol M, Giladi N, [place] JM. The Dynamic Gait Index in healthy older adults: the role of stair climbing, fear of falling and gender. Gait Posture. 2009 Feb;29(2):237-41. doi: 10.1016/j.gaitpost.2008.08.013. Epub 2008 Oct 8. PMID: 81154560; PMCID: EFR7290501.  Pardasaney, MYRTIS LOIS Bonus, GEANNIE POUR., et al. (2012). Sensitivity to change and responsiveness of four balance measures for community-dwelling older adults. Physical therapy 92(3): 388-397.                                                                                                                                    TREATMENT DATE: 10/11/2023 Aquatic PT at [facility]; pool temp 90 degrees   Patient seen for aquatic therapy today.  Treatment took place in water  3.6-4.8 feet deep depending upon activity.  Pt entered and exited the pool via steps with use of rail.   Warm up: Pt ambulated forward, backwards, and sideways 72ft x 2 without UE support. Followed by stretching including runners stretch 30 sec x 3 bilaterally and standing hamstring stretch against wall 30 sec x 3  Participated in the following balance, stabilization, and LE strengthening activities with use of water  for resistance. No additional resistance used this session.    Mini squat with pt performing horiz ab/adduction with use of small yellow barbells 2 x 10  Mini squat with pt moving arms forward/backwards for resistance with small yellow barbells 2 x 10  Standing marches with  use of yellow barbells for stabilization 2 x 10  Marching across pool with yellow barbells 40ft x 2, cues for erect posture and engaging core for increased stabilization  Deferred trial of step today 2/2 increased knee pain therefore performed forward and reverse lunges 2 x 10 of each bilaterally  Heel rises 2 x 10  Balance challenge alternating UE/LE extension with 10 sec hold Intermittent rest breaks provided throughout session 2/2 fatigue and pain knee>back  Completed session with cool down forward gait 58ft x 2 followed by knee to chest stretch against wall   Pt requires buoyancy of water  for support for reduced fall risk and for unloading/reduced stress on joints knees and low back as pt able to tolerate increased standing and ambulation in water  compared to that on land; viscosity of water  is needed for resistance for strengthening and current of water  provides perturbations for challenge for balance training       PATIENT EDUCATION: Education details: assessment details, rationale of PT intervention, benefit of aquatic PT, stroke risk factors: modifiable vs non-modifiable Person educated: Patient Education method: Explanation and Handouts Education comprehension: verbalized understanding  HOME EXERCISE PROGRAM: Access Code: [facility] URL: [email or web address] Date: 09/13/2023 Prepared by: Kelly Halpin  Exercises - Supine Posterior Pelvic Tilt  - 2-3 x weekly - 3 sets - 10 reps - Hooklying Isometric Hip Flexion  -  1 x daily - 2-3 x weekly - 3 sets - 10 reps - Supine Quad Set  - 1 x daily - 2-3 x weekly - 3 sets - 10 reps  GOALS: Goals reviewed with patient? Yes  SHORT TERM GOALS: Target date: 10/11/2023    Patient will be independent in HEP to improve functional outcomes Baseline: Goal status: IN PROGRESS   2.  Demo improved BLE strength and balance per time < 20 sec 5xSTS test Baseline: 27 sec Goal status:  IN PROGRESS   3.  Independent ambulation  uneven ground to return to gardening activities Baseline: CGA Goal status:  IN PROGRESS     LONG TERM GOALS: Target date: 11/08/2023    Independent w/ advanced HEP to include land and aquatic-based elements to prepare for D/C Baseline:  Goal status:  IN PROGRESS   2.  Modified independent floor/ground to stand to improve mobility and safety with gardening activities Baseline: NT Goal status:  IN PROGRESS   3.  Demo low risk for falls per score 20/24 Dynamic Gait Index to improve safety with mobility Baseline:  Goal status:  IN PROGRESS   4.  Improve gait speed to 2.8 ft/sec to improve efficiency community ambulation Baseline: 2.12 no AD Goal status:  IN PROGRESS   5.  Improve RLE strength to 5/5 for improved single limb support/stability Baseline: 4/5 Goal status:  IN PROGRESS   6.  Back/right knee pain not to exceed 3/10 with exercise/functional mobility/activities Baseline: 6/10 Goal status:  IN PROGRESS   ASSESSMENT:  CLINICAL IMPRESSION: Pt c/o increased R knee pain this am to which pt feels is partially due to weather. Pt will good tolerance to aquatic session with pt noting some mild increase in R knee pain due to increased activity however subsided with rest. Pt with good response to cues for bracing for increase stabilization technique without increase in LB pain. Pt continues to benefit from skilled PT and to continue POC.   OBJECTIVE IMPAIRMENTS: Abnormal gait, decreased activity tolerance, decreased balance, decreased mobility, difficulty walking, decreased strength, improper body mechanics, and pain.   ACTIVITY LIMITATIONS: carrying, lifting, bending, stairs, transfers, and locomotion level  PARTICIPATION LIMITATIONS: meal prep, cleaning, laundry, community activity, occupation, and yard work  PERSONAL FACTORS: Age, Time since onset of injury/illness/exacerbation, and 1-2 comorbidities: PMH are also affecting patient's functional outcome.   REHAB POTENTIAL:  Excellent  CLINICAL DECISION MAKING: Evolving/moderate complexity  EVALUATION COMPLEXITY: Moderate  PLAN:  PT FREQUENCY: 1-2x/week  PT DURATION: 8 weeks  PLANNED INTERVENTIONS: 97750- Physical Performance Testing, 97110-Therapeutic exercises, 97530- Therapeutic activity, [id number]- Neuromuscular re-education, 97535- Self Care, [id number]- Manual therapy, [id number]- Gait training, [phone]- Aquatic Therapy, [phone]- Electrical stimulation (unattended), and 20560 (1-2 muscles), 20561 (3+ muscles)- Dry Needling  PLAN FOR NEXT SESSION: discuss POC- pt unable to be seen in pool until 8/7. This is a priority for her, thus may consider dropping down to every other week on land and have her work on [employer]. Will likely need to extend POC to allow for more aquatic visits.  HEP review/progressions, balance+strength activities, floor to stand/sit   AQUATICS: Frequency: 1 Duration: 8 Special Instruction: chronic knee and LBP- pt with good experience with aquatic therapy in the past    9:23 AM, 10/11/23 M. Kelly Halpin, PT, DPT Physical Therapist- [place] Office Number: [phone]

## 2023-10-13 ENCOUNTER — Ambulatory Visit: Admitting: Physical Therapy

## 2023-10-13 ENCOUNTER — Telehealth: Payer: Self-pay | Admitting: *Deleted

## 2023-10-13 NOTE — Telephone Encounter (Signed)
 Faxed signed order below back to [employer], received fax confirmation.

## 2023-10-13 NOTE — Progress Notes (Signed)
 Subjective:    Patient ID: Amy Hooper, female    DOB: 1950-08-31, 73 y.o.   MRN: [id number]  HPI: Amy Hooper is a 73 y.o. female who returns for follow up appointment for chronic pain and medication refill. She states her  pain is located in her bilateral shoulders. Lower back pain, right knee and right foot tingling. She rates her pain 7. Her current exercise regime is attending physical therapy weekly, walking and performing stretching exercises.   Amy Hooper was admitted to [facility] on 08/18/2023 with ,Acute ischemic left ACA stroke  discharge summary was reviewed.   Amy Hooper Morphine  equivalent is 22.50  MME.   UDS was ordered today.    Pain Inventory Average Pain 7 Pain Right Now 7 My pain is dull  In the last 24 hours, has pain interfered with the following? General activity 7 Relation with others 7 Enjoyment of life 7 What TIME of day is your pain at its worst? morning , daytime, evening, and night Sleep (in general) Fair  Pain is worse with: walking Pain improves with: rest, heat/ice, therapy/exercise, medication, TENS, and injections Relief from Meds: 2  Family History  Problem Relation Age of Onset   Hypothyroidism Mother    Hyperlipidemia Mother    Miscarriages / [place] Mother    Hyperlipidemia Other    Kidney failure Father    Dementia Father    Gout Father    Arthritis Father    Prostate cancer Father    Heart disease Father    Hypertension Father    Kidney disease Father    Stroke Father    Social History   Socioeconomic History   Marital status: Married    Spouse name: Not on file   Number of children: 3   Years of education: Not on file   Highest education level: Bachelor's degree (e.g., BA, AB, BS)  Occupational History   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     Social Drivers of Corporate investment banker Strain: Low Risk  (07/25/2023)   Overall Financial Resource Strain (CARDIA)    Difficulty of Paying Living Expenses: Not hard at all  Food Insecurity: No Food Insecurity (08/18/2023)   Hunger Vital Sign    Worried About Running Out of Food in the Last Year: Never true    Ran Out of Food in the Last Year: Never true  Transportation Needs: No Transportation Needs (08/18/2023)   PRAPARE - Administrator, Civil Service (Medical): No    Lack of Transportation (Non-Medical): No  Physical Activity: Inactive (07/25/2023)   Exercise Vital Sign    Days of Exercise per Week: 0 days    Minutes of Exercise per Session: 20 min  Stress: Stress Concern Present (07/25/2023)   [employer] of Occupational Health - Occupational Stress Questionnaire    Feeling of Stress : Rather much  Social Connections: Moderately Integrated (08/18/2023)   Social Connection  and Isolation Panel    Frequency of Communication with Friends and Family: Three times a week    Frequency of Social Gatherings with Friends and Family: Twice a week    Attends Religious Services: Never    Database administrator or Organizations: Yes    Attends Banker Meetings: Never    Marital Status: Married  Recent Concern: Social Connections - Moderately Isolated (07/25/2023)   Social Connection and Isolation Panel    Frequency of Communication with Friends and Family: Three times a week    Frequency of Social Gatherings with Friends and Family: Once a week    Attends Religious Services: Never    Database administrator or Organizations: No    Attends Banker Meetings:  Never    Marital Status: Married   Past Surgical History:  Procedure Laterality Date   APPLICATION OF WOUND VAC N/A 03/25/2021   Procedure: APPLICATION OF WOUND VAC;  Surgeon: Barbarann Oneil BROCKS, MD;  Location: [facility] OR;  Service: Orthopedics;  Laterality: N/A;   APPLICATION OF WOUND VAC N/A 03/30/2021   Procedure: WOUND VAC CHANGE 12x6x5;  Surgeon: Barbarann Oneil BROCKS, MD;  Location: [facility] OR;  Service: Orthopedics;  Laterality: N/A;   INCISION AND DRAINAGE OF WOUND N/A 03/25/2021   Procedure: LUMBAR POST OP INCISION IRRIGATION;  Surgeon: Barbarann Oneil BROCKS, MD;  Location: [facility];  Service: Orthopedics;  Laterality: N/A;   JOINT REPLACEMENT     KNEE ARTHROSCOPY     LOOP RECORDER INSERTION N/A 08/23/2023   Procedure: LOOP RECORDER INSERTION;  Surgeon: Nancey Eulas BRAVO, MD;  Location: [facility];  Service: Cardiovascular;  Laterality: N/A;   LUMBAR WOUND DEBRIDEMENT N/A 03/30/2021   Procedure: REPEAT LUMBAR WOUND DEBRIDEMENT;  Surgeon: Barbarann Oneil BROCKS, MD;  Location: [facility];  Service: Orthopedics;  Laterality: N/A;   right rotator cuff     ROTATOR CUFF REPAIR Left    TOTAL KNEE ARTHROPLASTY Left 09/08/2020   Procedure: LEFT TOTAL KNEE ARTHROPLASTY;  Surgeon: Jerri Kay HERO, MD;  Location: [facility];  Service: Orthopedics;  Laterality: Left;   TUBAL LIGATION     Past Surgical History:  Procedure Laterality Date   APPLICATION OF WOUND VAC N/A 03/25/2021   Procedure: APPLICATION OF WOUND VAC;  Surgeon: Barbarann Oneil BROCKS, MD;  [facility]: [facility];  Service: Orthopedics;  Laterality: N/A;   APPLICATION OF WOUND VAC N/A 03/30/2021   Procedure: WOUND VAC CHANGE 12x6x5;  Surgeon: Barbarann Oneil BROCKS, MD;  Location: [facility];  Service: Orthopedics;  Laterality: N/A;   INCISION AND DRAINAGE OF WOUND N/A 03/25/2021   Procedure: LUMBAR POST OP INCISION IRRIGATION;  Surgeon: Barbarann Oneil BROCKS, MD;  [facility]: [facility];  Service: Orthopedics;  Laterality: N/A;   JOINT REPLACEMENT     KNEE ARTHROSCOPY     LOOP RECORDER INSERTION N/A 08/23/2023   Procedure: LOOP  RECORDER INSERTION;  Surgeon: Nancey Eulas BRAVO, MD;  [facility];  Service: Cardiovascular;  Laterality: N/A;   LUMBAR WOUND DEBRIDEMENT N/A 03/30/2021   Procedure: REPEAT LUMBAR WOUND DEBRIDEMENT;  Surgeon: Barbarann Oneil BROCKS, MD;  [facility]: [facility];  Service: Orthopedics;  Laterality: N/A;   right rotator cuff     ROTATOR CUFF REPAIR Left    TOTAL KNEE ARTHROPLASTY Left 09/08/2020   Procedure: LEFT TOTAL KNEE ARTHROPLASTY;  Surgeon: Jerri Kay HERO, MD;  Location: [facility];  Service: Orthopedics;  Laterality: Left;   TUBAL LIGATION     Past Medical History:  Diagnosis Date   Allergy 1976   Tramadol  NSAIDS   Chronic kidney disease    Depression    Fibromyalgia    Gout    HTN (hypertension)    Hyperlipidemia 09/01/22   Insomnia    Morbid obesity (HCC)    Non-recurrent acute suppurative otitis media of left ear without spontaneous rupture of tympanic membrane 04/30/2022   Osteoarthritis    Palpitation    Sleep apnea    Stroke (HCC) 08/23/2023   BP 117/73 (BP Location: Left Arm, Patient Position: Sitting, Cuff Size: Large)   Pulse (!) 46   Ht 5' 2 (1.575 m)   Wt 207 lb 12.8 oz (94.3 kg)   SpO2 95%   BMI 38.01 kg/m   Opioid Risk Score:   Fall Risk Score:  `1  Depression screen PHQ 2/9     10/14/2023    1:08 PM 09/19/2023    3:44 PM 06/20/2023    2:43 PM 04/25/2023    8:16 AM 01/12/2023    8:21 AM 12/13/2022    1:47 PM 11/15/2022   11:12 AM  Depression screen PHQ 2/9  Decreased Interest 1 0 0 1 0 0 0  Down, Depressed, Hopeless 1 0 0 3 0 0 0  PHQ - 2 Score 2 0 0 4 0 0 0  Altered sleeping    3     Tired, decreased energy    3     Change in appetite    1     Feeling bad or failure about yourself     1     Trouble concentrating    1     Moving slowly or fidgety/restless    0     Suicidal thoughts    0     PHQ-9 Score    13     Difficult doing work/chores    Somewhat difficult       Review of Systems  Musculoskeletal:  Positive for back pain.       Bilateral feet  and shoulders and right knee  Psychiatric/Behavioral:  Positive for dysphoric mood.   All other systems reviewed and are negative.      Objective:   Physical Exam Vitals and nursing note reviewed.  Constitutional:      Appearance: Normal appearance.  Cardiovascular:     Rate and Rhythm: Normal rate and regular rhythm.     Pulses: Normal pulses.     Heart sounds: Normal heart sounds.  Pulmonary:     Effort: Pulmonary effort is normal.     Breath sounds: Normal breath sounds.  Musculoskeletal:     Comments: Normal Muscle Bulk and Muscle Testing Reveals:  Upper Extremities: Full ROM and Muscle Strength 5/5 Bilateral AC Joint Tenderness  Lumbar Paraspinal Tenderness: L-4-L-5 Lower Extremities : Right: Decreased ROM and Muscle Strength 5/5 Right Lower Extremity Flexion Produces Pain into her Right Patella Left: Lower Extremity Full ROM and Muscle Strength 5/5 Arises from Table with ease Narrow Based Gait     Skin:    General: Skin is warm and dry.  Neurological:     Mental Status: She is alert and oriented to person, place, and time.  Psychiatric:        Mood and Affect: Mood normal.        Behavior: Behavior normal.          Assessment & Plan:  , Right Lumbar Radiculitis: No Complaints today. Continue Pregabalin .  Neurosurgeon at [facility] following. Continue  to Monitor. 10/14/2023 Chronic Bilateral Low Back Pain: S/P Lumbar Fusion: Dr Barbarann Following. Continue HEP as Tolerated. Continue to Monitor. 10/14/2023 2.. Fibromyalgia: Continue HEP as Tolerated. Continue Lyrica . Continue to Monitor. 10/14/2023 3. Primary Osteoarthritis: Left Knee: S/P on 09/08/20: Dr JERRI: LEFT TOTAL KNEE ARTHROPLASTY. Continue HEP as Tolerated. Continue to Monitor. 09/24/2023 4. Primary Osteoarthritis Right Knee: Continue HEP as Tolerated. Ortho Following. Continue to Monitor. 10/14/2023 5. Chronic Pain Syndrome: Continue :  Hydrocodone  7.5 mg /325 one tablet three times a  a day as needed  for pain #90. S Continue Amitriptyline   We will continue the opioid monitoring program, this consists of regular clinic visits, examinations, urine drug screen, pill counts as well as use of [place]  Controlled Substance Reporting system. A 12 month History has been reviewed on the [place]  Controlled Substance Reporting System on 10/14/2023 7. Depression: Continue Cymbalta . Continue to Monitor. 10/14/2023  8. Bradycardia: Apical Pulse checked. She  is prescribed Inderal  . She will F/U  call her Cardiologist, she verbalizes understanding.  Cardiology Following: Continue to Monitor. 10/14/2023 9. PHN: Continue Pregabalin . PCP Following. Continue to Monitor. 10/14/2023 10. Chronic Bilateral Shoulder Pain: Continue HEP as tolerated.  F/U in 2 months,

## 2023-10-14 ENCOUNTER — Encounter: Payer: Self-pay | Admitting: Registered Nurse

## 2023-10-14 ENCOUNTER — Encounter (HOSPITAL_BASED_OUTPATIENT_CLINIC_OR_DEPARTMENT_OTHER): Admitting: Registered Nurse

## 2023-10-14 VITALS — BP 117/73 | HR 48 | Ht 62.0 in | Wt 207.8 lb

## 2023-10-14 DIAGNOSIS — M792 Neuralgia and neuritis, unspecified: Secondary | ICD-10-CM | POA: Diagnosis present

## 2023-10-14 DIAGNOSIS — M25561 Pain in right knee: Secondary | ICD-10-CM | POA: Diagnosis present

## 2023-10-14 DIAGNOSIS — Z79891 Long term (current) use of opiate analgesic: Secondary | ICD-10-CM

## 2023-10-14 DIAGNOSIS — Z5181 Encounter for therapeutic drug level monitoring: Secondary | ICD-10-CM | POA: Diagnosis present

## 2023-10-14 DIAGNOSIS — G8929 Other chronic pain: Secondary | ICD-10-CM | POA: Diagnosis present

## 2023-10-14 DIAGNOSIS — M25511 Pain in right shoulder: Secondary | ICD-10-CM | POA: Diagnosis present

## 2023-10-14 DIAGNOSIS — M545 Low back pain, unspecified: Secondary | ICD-10-CM | POA: Diagnosis present

## 2023-10-14 DIAGNOSIS — M47817 Spondylosis without myelopathy or radiculopathy, lumbosacral region: Secondary | ICD-10-CM | POA: Diagnosis present

## 2023-10-14 DIAGNOSIS — M25512 Pain in left shoulder: Secondary | ICD-10-CM

## 2023-10-14 DIAGNOSIS — G894 Chronic pain syndrome: Secondary | ICD-10-CM

## 2023-10-14 DIAGNOSIS — R001 Bradycardia, unspecified: Secondary | ICD-10-CM

## 2023-10-14 NOTE — Patient Instructions (Signed)
 No script given: Call or send a My Chart message in two weeks with update on pain.   You are currently taking your Hydrocodone  1- 2 times a day as needed for pain.

## 2023-10-18 ENCOUNTER — Ambulatory Visit: Admitting: Rehabilitation

## 2023-10-18 ENCOUNTER — Telehealth: Payer: Self-pay

## 2023-10-18 NOTE — Telephone Encounter (Signed)
 LVM letting the patient know that she has been approved for gel injection Monovisc right nee and to please call back and ask for April if she is still interested

## 2023-10-18 NOTE — Telephone Encounter (Signed)
 Called patient and she said the cortisone injection has helped and is not in any pain at this time and would like to hold off and call us  when she is ready for her Monovisc

## 2023-10-19 LAB — TOXASSURE SELECT,+ANTIDEPR,UR

## 2023-10-20 ENCOUNTER — Ambulatory Visit

## 2023-10-21 ENCOUNTER — Ambulatory Visit: Admitting: Orthopaedic Surgery

## 2023-10-21 DIAGNOSIS — M1711 Unilateral primary osteoarthritis, right knee: Secondary | ICD-10-CM | POA: Diagnosis not present

## 2023-10-21 MED ORDER — HYALURONAN 88 MG/4ML IX SOSY
88.0000 mg | PREFILLED_SYRINGE | INTRA_ARTICULAR | Status: AC | PRN
  Administered 2023-10-21: 88 mg via INTRA_ARTICULAR

## 2023-10-21 NOTE — Progress Notes (Signed)
   Procedure Note  [name]             Date of Birth: 03/25/1950           MRN: [id number]             Visit Date: 10/21/2023  Procedures: Visit Diagnoses:  1. Primary osteoarthritis of right knee     Large Joint Inj: R knee on 10/21/2023 8:00 AM Indications: pain Details: 22 G needle  Arthrogram: No  Medications: 88 mg Hyaluronan 88 MG/4ML Outcome: tolerated well, no immediate complications Patient was prepped and draped in the usual sterile fashion.

## 2023-10-24 ENCOUNTER — Ambulatory Visit: Payer: Medicare Other | Admitting: Nurse Practitioner

## 2023-10-24 ENCOUNTER — Ambulatory Visit

## 2023-10-24 ENCOUNTER — Encounter: Payer: Self-pay | Admitting: Nurse Practitioner

## 2023-10-24 VITALS — BP 138/80 | HR 53 | Wt 208.4 lb

## 2023-10-24 DIAGNOSIS — R799 Abnormal finding of blood chemistry, unspecified: Secondary | ICD-10-CM

## 2023-10-24 DIAGNOSIS — N1831 Chronic kidney disease, stage 3a: Secondary | ICD-10-CM

## 2023-10-24 DIAGNOSIS — G720 Drug-induced myopathy: Secondary | ICD-10-CM

## 2023-10-24 DIAGNOSIS — E782 Mixed hyperlipidemia: Secondary | ICD-10-CM

## 2023-10-24 DIAGNOSIS — R635 Abnormal weight gain: Secondary | ICD-10-CM | POA: Insufficient documentation

## 2023-10-24 DIAGNOSIS — E559 Vitamin D deficiency, unspecified: Secondary | ICD-10-CM

## 2023-10-24 DIAGNOSIS — T466X5A Adverse effect of antihyperlipidemic and antiarteriosclerotic drugs, initial encounter: Secondary | ICD-10-CM

## 2023-10-24 DIAGNOSIS — Z8673 Personal history of transient ischemic attack (TIA), and cerebral infarction without residual deficits: Secondary | ICD-10-CM | POA: Diagnosis not present

## 2023-10-24 DIAGNOSIS — I10 Essential (primary) hypertension: Secondary | ICD-10-CM

## 2023-10-24 DIAGNOSIS — M172 Bilateral post-traumatic osteoarthritis of knee: Secondary | ICD-10-CM

## 2023-10-24 DIAGNOSIS — R002 Palpitations: Secondary | ICD-10-CM | POA: Diagnosis not present

## 2023-10-24 DIAGNOSIS — E669 Obesity, unspecified: Secondary | ICD-10-CM

## 2023-10-24 LAB — LIPID PANEL

## 2023-10-24 MED ORDER — REPATHA 140 MG/ML ~~LOC~~ SOSY: PREFILLED_SYRINGE | SUBCUTANEOUS | 11 refills | Status: AC

## 2023-10-24 MED ORDER — AMLODIPINE BESYLATE 5 MG PO TABS: 5.0000 mg | ORAL_TABLET | Freq: Every day | ORAL | Status: DC

## 2023-10-24 MED ORDER — WEGOVY 0.25 MG/0.5ML ~~LOC~~ SOAJ: 0.2500 mg | SUBCUTANEOUS | 0 refills | Status: DC

## 2023-10-24 NOTE — Assessment & Plan Note (Signed)
 Failed atorvastatin  and rosuvastatin  with significant muscle pain. Will attempt Repatha .

## 2023-10-24 NOTE — Assessment & Plan Note (Signed)
 Weight gain possibly exacerbated by Lyrica  increasing appetite. Concern about weight's impact on arthritis and overall health. - Submit prior authorization for Wegovy  - Encourage lifestyle modifications for weight management

## 2023-10-24 NOTE — Assessment & Plan Note (Signed)
 Labs to monitor kidney function today. Currently on farxiga  for protection. BP has been elevated, which does increase risk of damage. Will make changes to BP control today and continue current therapy for protection.

## 2023-10-24 NOTE — Patient Instructions (Addendum)
 Plan:  Restart amlodipine  Keep a check on BP and if it seems to drop too low stop the inderal  or hold the amlodipine  and let me know.  Send me your weekly BP readings  I will send in for the repatha  and try to get coverage for cholesterol  I will send in for the Wegovy  to see if this will help with weight and routine management.   Help for Weight Management For best management of weight, it is vital to balance intake versus output. This means the number of calories burned per day must be less than the calories you take in with food and drink.   I recommend trying to follow a diet with the following: Calories: 1200-1500 calories per day Carbohydrates: 150-180 grams of carbohydrates per day  Why: Gives your body enough quick fuel for cells to maintain normal function without sending them into starvation mode.  Protein: At least 90 grams of protein per day- 30 grams with each meal Why: Protein takes longer and uses more energy than carbohydrates to break down for fuel. The carbohydrates in your meals serves as quick energy sources and proteins help use some of that extra quick energy to break down to produce long term energy. This helps you not feel hungry as quickly and protein breakdown burns calories.  Water : Drink AT LEAST 64 ounces of water  per day  Why: Water  is essential to healthy metabolism. Water  helps to fill the stomach and keep you fuller longer. Water  is required for healthy digestion and filtering of waste in the body.  Fat: Limit fats in your diet- when choosing fats, choose foods with lower fats content such as lean meats (chicken, fish, [place]).  Why: Increased fat intake leads to storage for later. Once you burn your carbohydrate energy, your body goes into fat and protein breakdown mode to help you loose weight.  Cholesterol: Fats and oils that are LIQUID at room temperature are best. Choose vegetable oils (olive oil, avocado oil, nuts). Avoid fats that are SOLID at room  temperature (animal fats, processed meats). Healthy fats are often found in whole grains, beans, nuts, seeds, and berries.  Why: Elevated cholesterol levels lead to build up of cholesterol on the inside of your blood vessels. This will eventually cause the blood vessels to become hard and can lead to high blood pressure and damage to your organs. When the blood flow is reduced, but the pressure is high from cholesterol buildup, parts of the cholesterol can break off and form clots that can go to the brain or heart leading to a stroke or heart attack.  Fiber: Increase amount of SOLUBLE the fiber in your diet. This helps to fill you up, lowers cholesterol, and helps with digestion. Some foods high in soluble fiber are oats, peas, beans, apples, carrots, barley, and citrus fruits.   Why: Fiber fills you up, helps remove excess cholesterol, and aids in healthy digestion which are all very important in weight management.   I recommend the following as a minimum activity routine: Purposeful walk or other physical activity at least 20 minutes every single day. This means purposefully taking a walk, jog, bike, swim, treadmill, elliptical, dance, etc.  This activity should be ABOVE your normal daily activities, such as walking at work. Goal exercise should be at least 150 minutes a week- work your way up to this.   Heart Rate: Your maximum exercise heart rate should be 220 - Your Age in Years. When exercising, get your heart  rate up, but avoid going over the maximum targeted heart rate.  60-70% of your maximum heart rate is where you tend to burn the most fat. To find this number:  220 - Age In Years= Max HR  Max HR x 0.6 (or 0.7) = Fat Burning HR The Fat Burning HR is your goal heart rate while working out to burn the most fat.  NEVER exercise to the point your feel lightheaded, weak, nauseated, dizzy. If you experience ANY of these symptoms- STOP exercise! Allow yourself to cool down and your heart rate  to come down. Then restart slower next time.  If at ANY TIME you feel chest pain or chest pressure during exercise, STOP IMMEDIATELY and seek medical attention.

## 2023-10-24 NOTE — Assessment & Plan Note (Signed)
 Hypertension poorly controlled post-stroke with readings up to 170/97 mmHg. Previously managed on amlodipine , which was discontinued in the hospital. Inderal  prescribed for anxiety but not effective for blood pressure control. - Restart amlodipine  5 mg daily - Continue Inderal  for anxiety management - Monitor blood pressure regularly and report readings

## 2023-10-24 NOTE — Assessment & Plan Note (Signed)
 Post-acute phase of stroke with residual cognitive and motor deficits, including word-finding difficulties and balance issues. Stroke was cryptogenic with no identified atrial fibrillation or other etiology on initial evaluation. Loop recorder in place for cardiac monitoring. We discussed further prevention with GLP-1, diet, exercise, BP and lipid control.  - Continue therapy for balance improvement - Monitor loop recorder results monthly - Educate on recognizing stroke symptoms and importance of calling 911 if suspected - Start repatha  for lipids. - Restart amlodipine  for BP control

## 2023-10-24 NOTE — Assessment & Plan Note (Addendum)
 Statin intolerance with previous trials of atorvastatin  and rosuvastatin  causing muscle cramps. Recent CVA. - Submit prior authorization for Repatha 

## 2023-10-24 NOTE — Progress Notes (Signed)
 [place]:   [name], DNP, AGNP-c [facility]  [address] [place], [place] [address] [phone]  ESTABLISHED PATIENT- Chronic Health and/or Follow-Up Visit  Blood pressure 138/80, pulse (!) 53, weight 208 lb 6.4 oz (94.5 kg).   History of Present Illness Amy Hooper is a 73 year old female with a history of stroke who presents with concerns about balance, knee pain, and blood pressure management.  She experienced a stroke in June, resulting in cognitive difficulties such as word-finding issues and balance problems. She feels 'a little off' and staggers, as noted by her husband and sister. She is currently participating in both land and water  therapy to improve her balance.  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 Her knee pain has been managed with gel injections, the most recent being last Friday. Descending steps during therapy aggravated her knee pain, which had previously subsided.  Her blood pressure has been elevated since her stroke, with readings reaching 170/97 mmHg. Initially, she was put on Inderal  for anxiety and blood pressure management, but her blood pressure remains high. She previously took amlodipine , which stabilized her blood pressure. Her pulse has been noted to drop, particularly in hospital settings.  She has a history of cryptogenic stroke, with no identified atrial fibrillation despite extensive cardiac evaluations, including a loop recorder implantation. She experienced significant stress the week of her stroke, including a fraudulent activity scare and multiple car breakdowns, which may have contributed to her condition.  She is concerned about weight gain, which she attributes to Lyrica  increasing her appetite. She struggles with weight management due to knee pain limiting her physical activity. Her family history includes strokes in her father and  grandmother, and she acknowledges a genetic predisposition to weight issues.  She has experienced muscle cramps with atorvastatin  and rosuvastatin , leading to discontinuation of these medications. She is currently not on any cholesterol-lowering medication and is exploring other options.  All ROS negative with exception of what is listed above.   PHYSICAL EXAM Physical Exam Vitals and nursing note reviewed.  Constitutional:      Appearance: Normal appearance.  HENT:     Head: Normocephalic.  Eyes:     Pupils: Pupils are equal, round, and reactive to light.  Cardiovascular:     Rate and Rhythm: Normal rate and regular rhythm.     Pulses: Normal pulses.     Heart sounds: Normal heart sounds.  Pulmonary:     Effort: Pulmonary effort is normal.     Breath sounds: Normal breath sounds.  Musculoskeletal:        General: Normal range of motion.     Cervical back: Normal range of motion.     Right lower leg: No edema.     Left lower leg: No edema.  Skin:    General: Skin is warm.     Capillary Refill: Capillary refill takes less than 2 seconds.  Neurological:     General: No focal deficit present.     Mental Status: She is alert and oriented to person, place, and time.     Sensory: Sensory deficit present.     Motor: Weakness present.     Gait: Gait abnormal.  Psychiatric:        Mood and Affect: Mood normal.      PLAN Problem List Items Addressed This Visit     Essential hypertension   Hypertension poorly controlled post-stroke  with readings up to 170/97 mmHg. Previously managed on amlodipine , which was discontinued in the hospital. Inderal  prescribed for anxiety but not effective for blood pressure control. - Restart amlodipine  5 mg daily - Continue Inderal  for anxiety management - Monitor blood pressure regularly and report readings       Relevant Medications   amLODipine  (NORVASC ) 5 MG tablet   Semaglutide -Weight Management (WEGOVY ) 0.25 MG/0.5ML SOAJ   Evolocumab   (REPATHA ) 140 MG/ML SOSY   Other Relevant Orders   CBC with Differential/Platelet   CMP14+EGFR   Hemoglobin A1c   Lipid panel   Hyperlipidemia   Statin intolerance with previous trials of atorvastatin  and rosuvastatin  causing muscle cramps. Recent CVA. - Submit prior authorization for Repatha       Relevant Medications   amLODipine  (NORVASC ) 5 MG tablet   Semaglutide -Weight Management (WEGOVY ) 0.25 MG/0.5ML SOAJ   Evolocumab  (REPATHA ) 140 MG/ML SOSY   Other Relevant Orders   CMP14+EGFR   Lipid panel   CKD (chronic kidney disease) stage 3, GFR 30-59 ml/min (HCC)   Labs to monitor kidney function today. Currently on farxiga  for protection. BP has been elevated, which does increase risk of damage. Will make changes to BP control today and continue current therapy for protection.       Relevant Medications   Semaglutide -Weight Management (WEGOVY ) 0.25 MG/0.5ML SOAJ   Evolocumab  (REPATHA ) 140 MG/ML SOSY   Other Relevant Orders   CBC with Differential/Platelet   CMP14+EGFR   Bilateral post-traumatic osteoarthritis of knee   Chronic knee pain with bone-on-bone osteoarthritis. Recent exacerbation of pain following physical therapy. Receiving gel injections for pain management. - Continue gel injections for knee pain      Obesity (BMI 30-39.9)   Weight gain possibly exacerbated by Lyrica  increasing appetite. Concern about weight's impact on arthritis and overall health. - Submit prior authorization for Wegovy  - Encourage lifestyle modifications for weight management      Relevant Medications   Semaglutide -Weight Management (WEGOVY ) 0.25 MG/0.5ML SOAJ   History of stroke - Primary   Post-acute phase of stroke with residual cognitive and motor deficits, including word-finding difficulties and balance issues. Stroke was cryptogenic with no identified atrial fibrillation or other etiology on initial evaluation. Loop recorder in place for cardiac monitoring. We discussed further prevention  with GLP-1, diet, exercise, BP and lipid control.  - Continue therapy for balance improvement - Monitor loop recorder results monthly - Educate on recognizing stroke symptoms and importance of calling 911 if suspected - Start repatha  for lipids. - Restart amlodipine  for BP control      Relevant Medications   amLODipine  (NORVASC ) 5 MG tablet   Semaglutide -Weight Management (WEGOVY ) 0.25 MG/0.5ML SOAJ   Evolocumab  (REPATHA ) 140 MG/ML SOSY   Other Relevant Orders   CBC with Differential/Platelet   CMP14+EGFR   Hemoglobin A1c   Lipid panel   Abnormal weight gain   Weight gain possibly exacerbated by Lyrica  increasing appetite. Concern about weight's impact on arthritis and overall health. Recent stroke with increased CVD risks make her an excellent candidate for GLP-1 therapy.  - Submit prior authorization for Wegovy  - Encourage lifestyle modifications for weight management      Relevant Medications   Semaglutide -Weight Management (WEGOVY ) 0.25 MG/0.5ML SOAJ   Statin myopathy   Failed atorvastatin  and rosuvastatin  with significant muscle pain. Will attempt Repatha .       Relevant Medications   Evolocumab  (REPATHA ) 140 MG/ML SOSY   Vitamin D  deficiency   Relevant Orders   Vitamin D , 25-hydroxy  Other Visit Diagnoses       Abnormal finding of blood chemistry, unspecified       Relevant Orders   Hemoglobin A1c       Return in about 3 months (around 01/24/2024) for Med Management 30.  SaraBeth Ahijah Devery, DNP, AGNP-c Time: 49 minutes, >50% spent counseling, care coordination, chart review, and documentation.

## 2023-10-24 NOTE — Assessment & Plan Note (Signed)
 Weight gain possibly exacerbated by Lyrica  increasing appetite. Concern about weight's impact on arthritis and overall health. Recent stroke with increased CVD risks make her an excellent candidate for GLP-1 therapy.  - Submit prior authorization for Wegovy  - Encourage lifestyle modifications for weight management

## 2023-10-24 NOTE — Assessment & Plan Note (Signed)
 Chronic knee pain with bone-on-bone osteoarthritis. Recent exacerbation of pain following physical therapy. Receiving gel injections for pain management. - Continue gel injections for knee pain

## 2023-10-25 ENCOUNTER — Ambulatory Visit: Admitting: Rehabilitation

## 2023-10-25 ENCOUNTER — Ambulatory Visit: Payer: Self-pay | Admitting: Nurse Practitioner

## 2023-10-25 ENCOUNTER — Ambulatory Visit: Attending: Physical Medicine and Rehabilitation | Admitting: Physical Therapy

## 2023-10-25 DIAGNOSIS — M5459 Other low back pain: Secondary | ICD-10-CM | POA: Diagnosis present

## 2023-10-25 DIAGNOSIS — M6281 Muscle weakness (generalized): Secondary | ICD-10-CM | POA: Insufficient documentation

## 2023-10-25 DIAGNOSIS — R2681 Unsteadiness on feet: Secondary | ICD-10-CM | POA: Insufficient documentation

## 2023-10-25 DIAGNOSIS — G8929 Other chronic pain: Secondary | ICD-10-CM | POA: Insufficient documentation

## 2023-10-25 DIAGNOSIS — M25561 Pain in right knee: Secondary | ICD-10-CM | POA: Insufficient documentation

## 2023-10-25 LAB — CBC WITH DIFFERENTIAL/PLATELET
Basophils Absolute: 0 x10E3/uL (ref 0.0–0.2)
Basos: 1 %
EOS (ABSOLUTE): 0.1 x10E3/uL (ref 0.0–0.4)
Eos: 1 %
Hematocrit: 38.4 % (ref 34.0–46.6)
Hemoglobin: 12.1 g/dL (ref 11.1–15.9)
Immature Grans (Abs): 0 x10E3/uL (ref 0.0–0.1)
Immature Granulocytes: 0 %
Lymphocytes Absolute: 1.1 x10E3/uL (ref 0.7–3.1)
Lymphs: 20 %
MCH: 28 pg (ref 26.6–33.0)
MCHC: 31.5 g/dL (ref 31.5–35.7)
MCV: 89 fL (ref 79–97)
Monocytes Absolute: 0.5 x10E3/uL (ref 0.1–0.9)
Monocytes: 10 %
Neutrophils Absolute: 3.5 x10E3/uL (ref 1.4–7.0)
Neutrophils: 67 %
Platelets: 220 x10E3/uL (ref 150–450)
RBC: 4.32 x10E6/uL (ref 3.77–5.28)
RDW: 14.2 % (ref 11.7–15.4)
WBC: 5.2 x10E3/uL (ref 3.4–10.8)

## 2023-10-25 LAB — CMP14+EGFR
ALT: 39 IU/L — AB (ref 0–32)
AST: 20 IU/L (ref 0–40)
Albumin: 3.9 g/dL (ref 3.9–4.9)
Alkaline Phosphatase: 93 IU/L (ref 44–121)
BUN/Creatinine Ratio: 13 (ref 12–28)
BUN: 13 mg/dL (ref 8–27)
Bilirubin Total: <0.2 mg/dL (ref 0.0–1.2)
CO2: 20 mmol/L (ref 20–29)
Calcium: 8.9 mg/dL (ref 8.7–10.3)
Chloride: 109 mmol/L — ABNORMAL HIGH (ref 96–106)
Creatinine, Ser: 1.01 mg/dL — AB (ref 0.57–1.00)
Globulin, Total: 2.5 g/dL (ref 1.5–4.5)
Glucose: 96 mg/dL (ref 70–99)
Potassium: 3.8 mmol/L (ref 3.5–5.2)
Sodium: 144 mmol/L (ref 134–144)
Total Protein: 6.4 g/dL (ref 6.0–8.5)
eGFR: 60 mL/min/1.73 (ref >59)

## 2023-10-25 LAB — CUP PACEART REMOTE DEVICE CHECK
Date Time Interrogation Session: 20250804223612
Implantable Pulse Generator Implant Date: 20250603

## 2023-10-25 LAB — LIPID PANEL
Cholesterol, Total: 188 mg/dL (ref 100–199)
HDL: 54 mg/dL (ref >39)
LDL CALC COMMENT:: 3.5 ratio (ref 0.0–4.4)
LDL Chol Calc (NIH): 123 mg/dL — AB (ref 0–99)
Triglycerides: 60 mg/dL (ref 0–149)
VLDL Cholesterol Cal: 11 mg/dL (ref 5–40)

## 2023-10-25 LAB — HEMOGLOBIN A1C
Est. average glucose Bld gHb Est-mCnc: 123 mg/dL
Hgb A1c MFr Bld: 5.9 % — ABNORMAL HIGH (ref 4.8–5.6)

## 2023-10-25 LAB — VITAMIN D 25 HYDROXY (VIT D DEFICIENCY, FRACTURES): Vit D, 25-Hydroxy: 42.6 ng/mL (ref 30.0–100.0)

## 2023-10-25 NOTE — Therapy (Signed)
 OUTPATIENT PHYSICAL THERAPY NEURO TREATMENT/RECERT   Patient Name: Amy Hooper MRN: [id number] DOB:02/04/1951, 73 y.o., female Today's Date: 10/25/2023   PCP: Oris Camie BRAVO, NP REFERRING PROVIDER: Lorilee Sven SQUIBB, MD  END OF SESSION:  PT End of Session - 10/25/23 1020     Visit Number 5    Number of Visits 13    Date for PT Re-Evaluation 11/17/23    Authorization Type Medicare    Progress Note Due on Visit 10    PT Start Time 1019    PT Stop Time 1100    PT Time Calculation (min) 41 min    Equipment Utilized During Treatment --    Activity Tolerance Patient tolerated treatment well    Behavior During Therapy [facility] for tasks assessed/performed            Past Medical History:  Diagnosis Date   Allergy 1976   Tramadol  NSAIDS   Chronic kidney disease    CVA (cerebrovascular accident) (HCC) 08/18/2023   Depression    Fibromyalgia    Gout    HTN (hypertension)    Hyperlipidemia 09/01/22   Insomnia    Morbid obesity (HCC)    Morbid obesity (HCC)    Nocturnal leg cramps 07/27/2023   Non-recurrent acute suppurative otitis media of left ear without spontaneous rupture of tympanic membrane 04/30/2022   Osteoarthritis    Palpitation    Palpitations 04/22/2014   Rash and nonspecific skin eruption 07/23/2022   Sleep apnea    Stroke (HCC) 08/23/2023   Past Surgical History:  Procedure Laterality Date   APPLICATION OF WOUND VAC N/A 03/25/2021   Procedure: APPLICATION OF WOUND VAC;  Surgeon: Barbarann Oneil BROCKS, MD;  Location: [facility] OR;  Service: Orthopedics;  Laterality: N/A;   APPLICATION OF WOUND VAC N/A 03/30/2021   Procedure: WOUND VAC CHANGE 12x6x5;  Surgeon: Barbarann Oneil BROCKS, MD;  Location: [facility] OR;  Service: Orthopedics;  Laterality: N/A;   INCISION AND DRAINAGE OF WOUND N/A 03/25/2021   Procedure: LUMBAR POST OP INCISION IRRIGATION;  Surgeon: Barbarann Oneil BROCKS, MD;  Location: [facility] OR;  Service: Orthopedics;  Laterality: N/A;   JOINT REPLACEMENT     KNEE ARTHROSCOPY     LOOP  RECORDER INSERTION N/A 08/23/2023   Procedure: LOOP RECORDER INSERTION;  Surgeon: Nancey Eulas BRAVO, MD;  Location: [facility] INVASIVE CV LAB;  Service: Cardiovascular;  Laterality: N/A;   LUMBAR WOUND DEBRIDEMENT N/A 03/30/2021   Procedure: REPEAT LUMBAR WOUND DEBRIDEMENT;  Surgeon: Barbarann Oneil BROCKS, MD;  Location: [facility] OR;  Service: Orthopedics;  Laterality: N/A;   right rotator cuff     ROTATOR CUFF REPAIR Left    TOTAL KNEE ARTHROPLASTY Left 09/08/2020   Procedure: LEFT TOTAL KNEE ARTHROPLASTY;  Surgeon: Jerri Kay HERO, MD;  Location: [facility] OR;  Service: Orthopedics;  Laterality: Left;   TUBAL LIGATION     Patient Active Problem List   Diagnosis Date Noted   History of stroke 10/24/2023   Abnormal weight gain 10/24/2023   Statin myopathy 10/24/2023   Obesity (BMI 30-39.9) 08/18/2023   Premature atrial complexes 12/20/2022   Forearm tendonitis 07/23/2022   Asymptomatic bradycardia 07/23/2022   Rheumatoid arthritis with rheumatoid factor of multiple sites without organ or systems involvement (HCC) 04/30/2022   Osteopenia 03/03/2022   Degenerative spondylolisthesis 02/19/2021   Status post total left knee replacement 09/08/2020   OSA (obstructive sleep apnea) 05/19/2020   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/17/2020   Chronic pain syndrome 03/03/2020   S/P lumbar fusion  02/05/2020   Vitamin D  deficiency 01/09/2020   Hyperlipidemia 01/09/2020   CKD (chronic kidney disease) stage 3, GFR 30-59 ml/min (HCC) 01/09/2020   Essential hypertension    Gout    Fibromyalgia    Depression    Insomnia    Chronically low serum potassium 04/22/2014   Environmental and seasonal allergies 04/22/2014    ONSET DATE: 08/23/23  REFERRING DIAG:  I63.9 (ICD-10-CM) - Cerebrovascular accident (CVA), unspecified mechanism (HCC)    THERAPY DIAG:  Unsteadiness on feet  Other low back pain  Muscle weakness (generalized)  Chronic pain of right knee  Rationale for Evaluation and Treatment:  Rehabilitation  SUBJECTIVE:                                                                                                                                                                                             SUBJECTIVE STATEMENT: Just having more pain in R knee and back since the stroke.  Got a gel shot in my R knee a few days okay.  Been trying to use knee sleeve and heat for back.  The rain today is contributing to more pain.   Pt accompanied by: self  PERTINENT HISTORY: History of HTN, T2DM, OSA, CKD 3B, fibromyalgia who was admitted on 08/18/2023 with reports of funny feeling oral, progressive RUE weakness at night prior to admission the next day with RUE weakness on awakening. 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 MRI brain showed area of small acute ischemia within posterior paramedian left frontal lobe and multifocal hyperintense T2 weighted signals within white matter. Dr. Jerri felt the stroke was of unclear etiology question cardiac source and loop recorder was placed at discharge Hx of lumbar surgery (fusion?) with infection complications.  PAIN:  Are you having pain? Yes: NPRS scale: stiff (back); 9 (R knee) Pain location: central lumbar; right knee medial compartment Pain description: sharp, burn, pain Aggravating factors: standing, upright Relieving factors: rest,   PRECAUTIONS: Fall  RED FLAGS: None   WEIGHT BEARING RESTRICTIONS: No  FALLS: Has patient fallen in last 6 months? No  LIVING ENVIRONMENT: Lives with:  Lives in: House/apartment Stairs: 7 steps to enter, ground floor Has following equipment at home: Single point cane  PLOF: Independent, Independent with basic ADLs, and Independent with household mobility without device  PATIENT GOALS:   OBJECTIVE:    TODAY'S TREATMENT: 10/25/2023 Activity Comments     FTSTS:  27.94 sec Arms crossed at chest; knee pain rated as 9/10 today  Reviewed suitcase carry  (8#) and sumo squat (4#) Cues for technique, and pt hasn't  fully done at home  Discussed ways to move through work day: -seated foot pedaler  -seated exercises-motion is lotion -setting timer for reminder to movement  Forward/back walking at counter, 3 reps Sidestepping along counter, 2 reps Light UE support  NuStep, Level 4>5, 4 extremities x 10 min SPM 50-60  Gait 50 ft with improved fluid movement, good arm swing   10 M walk:  11.59 sec = 2.83 ft/sec           HOME EXERCISE PROGRAM Last updated: 09/29/23 Access Code: [facility] URL: [email or web address] Date: 09/29/2023 Prepared by: [facility] - Outpatient  Rehab - [facility]  Exercises - Supine Posterior Pelvic Tilt  - 2-3 x weekly - 3 sets - 10 reps - Hooklying Isometric Hip Flexion  - 1 x daily - 2-3 x weekly - 3 sets - 10 reps - Supine Quad Set  - 1 x daily - 2-3 x weekly - 3 sets - 10 reps - Alternating Backward Step  - 1 x daily - 5 x weekly - 2 sets - 10 reps - Side Stepping with Unilateral Counter Support  - 1 x daily - 5 x weekly - 2 sets - 10 reps - Active Straight Leg Raise with Quad Set  - 1 x daily - 7 x weekly - 3 sets - 10 reps - Kettlebell Suitcase Carry  - 1 x daily - 7 x weekly - 3 sets - 10 reps - Sumo Squat with Dumbbell  - 1 x daily - 7 x weekly - 1-3 sets - 10 reps   PATIENT EDUCATION: Education details: Progress towards goals, POC, continueing aquatic therapy appts in August, and then plans to d/c PT end of August, to transition to [employer] for fitness; discussed ways to incorporate fitness/movement/exercise into her routine at work Person educated: Patient Education method: Explanation Education comprehension: verbalized understanding     Note: Objective measures were completed at Evaluation unless otherwise noted.  DIAGNOSTIC FINDINGS:   COGNITION: Overall cognitive status: Within functional limits for tasks assessed   SENSATION: Numbness along right lateral foot (since  back surgery)  COORDINATION:  WNL  MUSCLE TONE: WNL      POSTURE: increased lumbar lordosis  LOWER EXTREMITY ROM:     Active  Right Eval Left Eval  Hip flexion 100+ 100+  Hip extension    Hip abduction    Hip adduction    Hip internal rotation    Hip external rotation    Knee flexion 115 115  Knee extension -5 0  Ankle dorsiflexion 7 14  Ankle plantarflexion    Ankle inversion    Ankle eversion     (Blank rows = not tested)  LOWER EXTREMITY MMT:  resisted tests in sitting  MMT Right Eval Left Eval  Hip flexion 4 5  Hip extension    Hip abduction 4 4  Hip adduction    Hip internal rotation    Hip external rotation    Knee flexion 4 5  Knee extension 4 5  Ankle dorsiflexion 3+ 4  Ankle plantarflexion 2+ 3-  Ankle inversion    Ankle eversion    (Blank rows = not tested)  Trunk flexion: 2+/5 Trunk extension: NT Single leg stance LLE: 10+ sec; RLE: 5-8 sec  BED MOBILITY:  indep  TRANSFERS: Sit-stand and chair-chair independent Floor to chair/stand: DNT   CURB:  Findings: modified indep  STAIRS: Findings: Comments: modified indep step-to pattern GAIT: Findings: Distance walked:   and Comments: mildly antalgic RLE  Independent-mod I level surfaces; CGA uneven FUNCTIONAL TESTS:  5 times sit to stand: 27 sec 2 minute walk test:  10 meter walk test: 15.4 sec = 2.12 ft/sec  M-CTSIB  Condition 1: Firm Surface, EO 30 Sec, Normal Sway  Condition 2: Firm Surface, EC 30 Sec, Normal Sway  Condition 3: Foam Surface, EO  Sec,  Sway  Condition 4: Foam Surface, EC  Sec,  Sway    Dynamic Gait Index: Dynamic Gait Index  Mark the lowest level that applies.   Date Performed 6/24  Gait level surface (2) Mild Impairment: Walks 20', uses AD, slower speed, mild gait deviations  2. Change in gait speed (3) Normal: Able to smoothly change walking speed without loss of balance or gait deviation. Shows a significant difference in walking speeds between normal,  fast and slow speeds  3. Gait with horizontal head turns (2) Mild Impairment: Performs head turns smoothly with slight change in gait velocity, i.e., minor disruption to smooth gait path or uses walking aid  4. Gait with vertical head turns (2) Mild Impairment: Performs head turns smoothly with slight change in gait velocity, i.e., minor disruption to smooth gait path or uses walking aid  5. Gait and pivot turn (2) Mild Impairment: Pivot turns safely in > 3 seconds and stops with no loss of balance  6. Step over obstacle (2) Mild Impairment: Is able to step over box, but must slow down and adjust steps to clear box safely  7. Step around obstacle (3) Normal: Is able to walk around cones safely without changing gait speed; no evidence of imbalance  8. Steps (1) Moderate Impairment: Two feet to a stair, must use rail  Total score 17/24    Score Interpretation: Score of <19 indicates high risk of falls.  Minimally Clinically Important Difference (MCID):  =DGI scores of<21/24 = 1.80 points DGI scores of >21/24 = 0.60 points   [place] T, Inbar-Borovsky N, Brozgol M, Giladi N, [place] JM. The Dynamic Gait Index in healthy older adults: the role of stair climbing, fear of falling and gender. Gait Posture. 2009 Feb;29(2):237-41. doi: 10.1016/j.gaitpost.2008.08.013. Epub 2008 Oct 8. PMID: 81154560; PMCID: [id number].  Pardasaney, MYRTIS LOIS Bonus, GEANNIE POUR., et al. (2012). Sensitivity to change and responsiveness of four balance measures for community-dwelling older adults. Physical therapy 92(3): 388-397.                                                                                                                                    TREATMENT DATE: 09/13/23    PATIENT EDUCATION: Education details: assessment details, rationale of PT intervention, benefit of aquatic PT, stroke risk factors: modifiable vs non-modifiable Person educated: Patient Education method: Explanation and Handouts Education  comprehension: verbalized understanding  HOME EXERCISE PROGRAM: Access Code: [email or web address] URL: [email or web address] Date: 09/13/2023 Prepared by: Kelly Halpin  Exercises - Supine Posterior Pelvic Tilt  - 2-3 x weekly - 3 sets - 10  reps - Hooklying Isometric Hip Flexion  - 1 x daily - 2-3 x weekly - 3 sets - 10 reps - Supine Quad Set  - 1 x daily - 2-3 x weekly - 3 sets - 10 reps  GOALS: Goals reviewed with patient? Yes  SHORT TERM GOALS: Target date: 10/11/2023    Patient will be independent in HEP to improve functional outcomes Baseline: Goal status: IN PROGRESS, 10/25/2023   2.  Demo improved BLE strength and balance per time < 20 sec 5xSTS test Baseline: 27 sec Goal status:  NOT MET, 10/25/2023  3.  Independent ambulation uneven ground to return to gardening activities Baseline: CGA; per report-not using cane-hasn't gone in the yard 10/25/2023 (could not assess due to rain today) Goal status:  DEFERRED, pt reports they have moved gardening area and she does not want to work on this 10/25/2023    LONG TERM GOALS: Target date: 11/08/2023>UPDATED TARGET 11/17/2023    Independent w/ advanced HEP to include land and aquatic-based elements to prepare for D/C Baseline:  Goal status:  IN PROGRESS   2.  Modified independent floor/ground to stand to improve mobility and safety with gardening activities Baseline: NT Goal status:  IN PROGRESS   3.  Demo low risk for falls per score 20/24 Dynamic Gait Index to improve safety with mobility Baseline:  Goal status:  IN PROGRESS   4.  Improve gait speed to 2.8 ft/sec to improve efficiency community ambulation Baseline: 2.12 no AD>2.83 ft/sec Goal status: MET, 10/25/2023  5.  Improve RLE strength to 5/5 for improved single limb support/stability Baseline: 4/5 Goal status:  IN PROGRESS   6.  Back/right knee pain not to exceed 3/10 with exercise/functional mobility/activities Baseline: 6/10 Goal status:  IN PROGRESS    ASSESSMENT:  CLINICAL IMPRESSION: Pt presents today and reports pain in worse today due to rainy weather.  Reviewed HEP and worked on gentle seated exercises and NuStep, which improve pt's pain and flexibility/fluid movement today.  Assessed STGs today and Pt has not yet met STG 1 and 2; STG 3 deferred, as pt reports they have rearranged the garden area to a level surface.  Assessed gait velocity and pt has met LTG 4 for improved gait velocity, from 2.12 ft/sec>2/83 ft/sec.  Pt has only made 5 appointments thus far in POC-due to work schedule and pt trying to get rescheduled to aquatic therapy.  Thus far, she has had one aquatic therapy session (?) and has several more scheduled, with pt verbalizing plan to transition to joining [employer] to be able to access the pool.  Recert completed today, for patient to complete her aquatic therapy sessions to build aquatic HEP and to try to work in more pain free environment, given her back and knee pain.    OBJECTIVE IMPAIRMENTS: Abnormal gait, decreased activity tolerance, decreased balance, decreased mobility, difficulty walking, decreased strength, improper body mechanics, and pain.   ACTIVITY LIMITATIONS: carrying, lifting, bending, stairs, transfers, and locomotion level  PARTICIPATION LIMITATIONS: meal prep, cleaning, laundry, community activity, occupation, and yard work  PERSONAL FACTORS: Age, Time since onset of injury/illness/exacerbation, and 1-2 comorbidities: PMH are also affecting patient's functional outcome.   REHAB POTENTIAL: Excellent  CLINICAL DECISION MAKING: Evolving/moderate complexity  EVALUATION COMPLEXITY: Moderate  PLAN:  PT FREQUENCY: 1-2x/week  PT DURATION: 4 weeks  PLANNED INTERVENTIONS: 97750- Physical Performance Testing, 97110-Therapeutic exercises, 97530- Therapeutic activity, [id number]- Neuromuscular re-education, 97535- Self Care, 02859- Manual therapy, [id number]- Gait training, [phone]- Aquatic Therapy, [phone]- Electrical  stimulation (unattended),  and 79439 (1-2 muscles), 20561 (3+ muscles)- Dry Needling  PLAN FOR NEXT SESSION: Recert completed today-pt to continue aquatic sessions and return to clinic 8/28 for LTG check and planned discharge then  HEP review/progressions, balance+strength activities, floor to stand/sit   AQUATICS: Frequency: 1 Duration: 4 Special Instruction: chronic knee and LBP- pt with good experience with aquatic therapy in the past    Greig Anon, PT 10/25/23 12:06 PM Phone: [phone] Fax: [phone]  [facility] at [facility] [address], [address] [place], [place] [address] Phone # [phone] Fax # [phone]

## 2023-10-26 ENCOUNTER — Other Ambulatory Visit (HOSPITAL_COMMUNITY): Payer: Self-pay

## 2023-10-26 ENCOUNTER — Telehealth: Payer: Self-pay | Admitting: Pharmacy Technician

## 2023-10-26 NOTE — Telephone Encounter (Signed)
 Pharmacy Patient Advocate Encounter  Received notification from [facility] that Prior Authorization for Wegovy  0.25MG /0.5ML auto-injectors  has been APPROVED from 10/26/2023 to 03/21/2024. Unable to obtain price due to refill too soon rejection, last fill date 10/26/2023 next available fill date08/27/2025.   PA #/Case ID/Reference #: [id number]

## 2023-10-26 NOTE — Telephone Encounter (Signed)
 Pharmacy Patient Advocate Encounter  Received notification from [facility] that Prior Authorization for Repatha  SureClick 140MG /ML auto-injectors  has been APPROVED from 10/26/2023 to 04/28/2023. Unable to obtain price due to refill too soon rejection, last fill date 10/26/2023 next available fill date10/10/2023.   PA #/Case ID/Reference #: [id number]

## 2023-10-26 NOTE — Telephone Encounter (Signed)
 Pharmacy Patient Advocate Encounter   Received notification from Onbase that prior authorization for Wegovy  0.25mg /0.22ml auto-injectors is required/requested.   Insurance verification completed.   The patient is insured through [employer] .   Per test claim: PA required; PA submitted to above mentioned insurance via LATENT Key/confirmation #/EOC [id number] Status is pending

## 2023-10-26 NOTE — Telephone Encounter (Signed)
 Pharmacy Patient Advocate Encounter   Received notification from Onbase that prior authorization for Repatha  140mg /ml sureclick is required/requested.   Insurance verification completed.   The patient is insured through [employer] .   Per test claim: PA required; PA submitted to above mentioned insurance via LATENT Key/confirmation #/EOC [id number] Status is pending

## 2023-10-27 ENCOUNTER — Ambulatory Visit: Admitting: Physical Therapy

## 2023-10-27 ENCOUNTER — Encounter: Payer: Self-pay | Admitting: Physical Therapy

## 2023-10-27 ENCOUNTER — Ambulatory Visit: Attending: Physical Medicine and Rehabilitation | Admitting: Physical Therapy

## 2023-10-27 DIAGNOSIS — M5459 Other low back pain: Secondary | ICD-10-CM | POA: Insufficient documentation

## 2023-10-27 DIAGNOSIS — M6281 Muscle weakness (generalized): Secondary | ICD-10-CM | POA: Diagnosis present

## 2023-10-27 DIAGNOSIS — M25561 Pain in right knee: Secondary | ICD-10-CM | POA: Diagnosis present

## 2023-10-27 DIAGNOSIS — R2681 Unsteadiness on feet: Secondary | ICD-10-CM | POA: Diagnosis present

## 2023-10-27 DIAGNOSIS — G8929 Other chronic pain: Secondary | ICD-10-CM | POA: Diagnosis present

## 2023-10-27 NOTE — Therapy (Signed)
 OUTPATIENT PHYSICAL THERAPY NEURO TREATMENT   Patient Name: Amy Hooper MRN: [id number] DOB:24-Jul-1950, 73 y.o., female Today's Date: 10/27/2023   PCP: Amy Camie BRAVO, NP REFERRING PROVIDER: Lorilee Sven SQUIBB, MD  END OF SESSION:  PT End of Session - 10/27/23 1151     Visit Number 6    Number of Visits 13    Date for PT Re-Evaluation 11/17/23    Authorization Type Medicare    Progress Note Due on Visit 10    PT Start Time 1149    PT Stop Time 1231    PT Time Calculation (min) 42 min    Equipment Utilized During Treatment Other (comment)   floatation devices as needed for safety and challenge   Activity Tolerance Patient tolerated treatment well    Behavior During Therapy [facility] for tasks assessed/performed            Past Medical History:  Diagnosis Date   Allergy 1976   Tramadol  NSAIDS   Chronic kidney disease    CVA (cerebrovascular accident) (HCC) 08/18/2023   Depression    Fibromyalgia    Gout    HTN (hypertension)    Hyperlipidemia 09/01/22   Insomnia    Morbid obesity (HCC)    Morbid obesity (HCC)    Nocturnal leg cramps 07/27/2023   Non-recurrent acute suppurative otitis media of left ear without spontaneous rupture of tympanic membrane 04/30/2022   Osteoarthritis    Palpitation    Palpitations 04/22/2014   Rash and nonspecific skin eruption 07/23/2022   Sleep apnea    Stroke (HCC) 08/23/2023   Past Surgical History:  Procedure Laterality Date   APPLICATION OF WOUND VAC N/A 03/25/2021   Procedure: APPLICATION OF WOUND VAC;  Surgeon: Amy Oneil BROCKS, MD;  Location: [facility] OR;  Service: Orthopedics;  Laterality: N/A;   APPLICATION OF WOUND VAC N/A 03/30/2021   Procedure: WOUND VAC CHANGE 12x6x5;  Surgeon: Amy Oneil BROCKS, MD;  Location: [facility] OR;  Service: Orthopedics;  Laterality: N/A;   INCISION AND DRAINAGE OF WOUND N/A 03/25/2021   Procedure: LUMBAR POST OP INCISION IRRIGATION;  Surgeon: Amy Oneil BROCKS, MD;  Location: [facility] OR;  Service: Orthopedics;   Laterality: N/A;   JOINT REPLACEMENT     KNEE ARTHROSCOPY     LOOP RECORDER INSERTION N/A 08/23/2023   Procedure: LOOP RECORDER INSERTION;  Surgeon: Amy Eulas BRAVO, MD;  Location: [facility];  Service: Cardiovascular;  Laterality: N/A;   LUMBAR WOUND DEBRIDEMENT N/A 03/30/2021   Procedure: REPEAT LUMBAR WOUND DEBRIDEMENT;  Surgeon: Amy Oneil BROCKS, MD;  Location: [facility] OR;  Service: Orthopedics;  Laterality: N/A;   right rotator cuff     ROTATOR CUFF REPAIR Left    TOTAL KNEE ARTHROPLASTY Left 09/08/2020   Procedure: LEFT TOTAL KNEE ARTHROPLASTY;  Surgeon: Amy Kay HERO, MD;  Location: [facility] OR;  Service: Orthopedics;  Laterality: Left;   TUBAL LIGATION     Patient Active Problem List   Diagnosis Date Noted   History of stroke 10/24/2023   Abnormal weight gain 10/24/2023   Statin myopathy 10/24/2023   Obesity (BMI 30-39.9) 08/18/2023   Premature atrial complexes 12/20/2022   Forearm tendonitis 07/23/2022   Asymptomatic bradycardia 07/23/2022   Rheumatoid arthritis with rheumatoid factor of multiple sites without organ or systems involvement (HCC) 04/30/2022   Osteopenia 03/03/2022   Degenerative spondylolisthesis 02/19/2021   Status post total left knee replacement 09/08/2020   OSA (obstructive sleep apnea) 05/19/2020   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/17/2020  Chronic pain syndrome 03/03/2020   S/P lumbar fusion 02/05/2020   Vitamin D  deficiency 01/09/2020   Hyperlipidemia 01/09/2020   CKD (chronic kidney disease) stage 3, GFR 30-59 ml/min (HCC) 01/09/2020   Essential hypertension    Gout    Fibromyalgia    Depression    Insomnia    Chronically low serum potassium 04/22/2014   Environmental and seasonal allergies 04/22/2014    ONSET DATE: 08/23/23  REFERRING DIAG:  I63.9 (ICD-10-CM) - Cerebrovascular accident (CVA), unspecified mechanism (HCC)    THERAPY DIAG:  Unsteadiness on feet  Other low back pain  Muscle weakness (generalized)  Chronic  pain of right knee  Muscle weakness-general  Rationale for Evaluation and Treatment: Rehabilitation  SUBJECTIVE:                                                                                                                                                                                             SUBJECTIVE STATEMENT: Amy Hooper  Reports she almost called out today as her pain is severe in the R knee.  She feels the rain has set her back.  Pt in hot tub just prior to appt stating this really helped her pain.   Pt accompanied by: self  PERTINENT HISTORY: History of HTN, T2DM, OSA, CKD 3B, fibromyalgia who was admitted on 08/18/2023 with reports of funny feeling oral, progressive RUE weakness at night prior to admission the next day with RUE weakness on awakening. 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 MRI brain showed area of small acute ischemia within posterior paramedian left frontal lobe and multifocal hyperintense T2 weighted signals within white matter. Dr. Jerri felt the stroke was of unclear etiology question cardiac source and loop recorder was placed at discharge Hx of lumbar surgery (fusion?) with infection complications.  PAIN:  Are you having pain? Yes: NPRS scale: stiff (back); 9 (R knee) Pain location: central lumbar; right knee medial compartment Pain description: sharp, burn, pain Aggravating factors: standing, upright Relieving factors: rest,   PRECAUTIONS: Fall  RED FLAGS: None   WEIGHT BEARING RESTRICTIONS: No  FALLS: Has patient fallen in last 6 months? No  LIVING ENVIRONMENT: Lives with:  Lives in: House/apartment Stairs: 7 steps to enter, ground floor Has following equipment at home: Single point cane  PLOF: Independent, Independent with basic ADLs, and Independent with household mobility without device  PATIENT GOALS:   OBJECTIVE:   HOME EXERCISE PROGRAM Last updated: 09/29/23 Access Code: [facility] URL:  [email or web address] Date: 09/29/2023 Prepared by: [facility] - Outpatient  Rehab - [facility]  Exercises - Supine Posterior Pelvic Tilt  -  2-3 x weekly - 3 sets - 10 reps - Hooklying Isometric Hip Flexion  - 1 x daily - 2-3 x weekly - 3 sets - 10 reps - Supine Quad Set  - 1 x daily - 2-3 x weekly - 3 sets - 10 reps - Alternating Backward Step  - 1 x daily - 5 x weekly - 2 sets - 10 reps - Side Stepping with Unilateral Counter Support  - 1 x daily - 5 x weekly - 2 sets - 10 reps - Active Straight Leg Raise with Quad Set  - 1 x daily - 7 x weekly - 3 sets - 10 reps - Kettlebell Suitcase Carry  - 1 x daily - 7 x weekly - 3 sets - 10 reps - Sumo Squat with Dumbbell  - 1 x daily - 7 x weekly - 1-3 sets - 10 reps   PATIENT EDUCATION: Education details: Progress towards goals, POC, continueing aquatic therapy appts in August, and then plans to d/c PT end of August, to transition to [employer] for fitness; discussed ways to incorporate fitness/movement/exercise into her routine at work Person educated: Patient Education method: Explanation Education comprehension: verbalized understanding     Note: Objective measures were completed at Evaluation unless otherwise noted.  DIAGNOSTIC FINDINGS:   COGNITION: Overall cognitive status: Within functional limits for tasks assessed   SENSATION: Numbness along right lateral foot (since back surgery)  COORDINATION:  WNL  MUSCLE TONE: WNL      POSTURE: increased lumbar lordosis  LOWER EXTREMITY ROM:     Active  Right Eval Left Eval  Hip flexion 100+ 100+  Hip extension    Hip abduction    Hip adduction    Hip internal rotation    Hip external rotation    Knee flexion 115 115  Knee extension -5 0  Ankle dorsiflexion 7 14  Ankle plantarflexion    Ankle inversion    Ankle eversion     (Blank rows = not tested)  LOWER EXTREMITY MMT:  resisted tests in sitting  MMT Right Eval Left Eval  Hip flexion 4 5   Hip extension    Hip abduction 4 4  Hip adduction    Hip internal rotation    Hip external rotation    Knee flexion 4 5  Knee extension 4 5  Ankle dorsiflexion 3+ 4  Ankle plantarflexion 2+ 3-  Ankle inversion    Ankle eversion    (Blank rows = not tested)  Trunk flexion: 2+/5 Trunk extension: NT Single leg stance LLE: 10+ sec; RLE: 5-8 sec  BED MOBILITY:  indep  TRANSFERS: Sit-stand and chair-chair independent Floor to chair/stand: DNT   CURB:  Findings: modified indep  STAIRS: Findings: Comments: modified indep step-to pattern GAIT: Findings: Distance walked:   and Comments: mildly antalgic RLE Independent-mod I level surfaces; CGA uneven FUNCTIONAL TESTS:  5 times sit to stand: 27 sec 2 minute walk test:  10 meter walk test: 15.4 sec = 2.12 ft/sec  M-CTSIB  Condition 1: Firm Surface, EO 30 Sec, Normal Sway  Condition 2: Firm Surface, EC 30 Sec, Normal Sway  Condition 3: Foam Surface, EO  Sec,  Sway  Condition 4: Foam Surface, EC  Sec,  Sway    Dynamic Gait Index: Dynamic Gait Index  Mark the lowest level that applies.   Date Performed 6/24  Gait level surface (2) Mild Impairment: Walks 20', uses AD, slower speed, mild gait deviations  2. Change in gait speed (3) Normal:  Able to smoothly change walking speed without loss of balance or gait deviation. Shows a significant difference in walking speeds between normal, fast and slow speeds  3. Gait with horizontal head turns (2) Mild Impairment: Performs head turns smoothly with slight change in gait velocity, i.e., minor disruption to smooth gait path or uses walking aid  4. Gait with vertical head turns (2) Mild Impairment: Performs head turns smoothly with slight change in gait velocity, i.e., minor disruption to smooth gait path or uses walking aid  5. Gait and pivot turn (2) Mild Impairment: Pivot turns safely in > 3 seconds and stops with no loss of balance  6. Step over obstacle (2) Mild Impairment: Is able  to step over box, but must slow down and adjust steps to clear box safely  7. Step around obstacle (3) Normal: Is able to walk around cones safely without changing gait speed; no evidence of imbalance  8. Steps (1) Moderate Impairment: Two feet to a stair, must use rail  Total score 17/24    Score Interpretation: Score of <19 indicates high risk of falls.  Minimally Clinically Important Difference (MCID):  =DGI scores of<21/24 = 1.80 points DGI scores of >21/24 = 0.60 points   [place] T, Inbar-Borovsky N, Brozgol M, Giladi N, [place] JM. The Dynamic Gait Index in healthy older adults: the role of stair climbing, fear of falling and gender. Gait Posture. 2009 Feb;29(2):237-41. doi: 10.1016/j.gaitpost.2008.08.013. Epub 2008 Oct 8. PMID: 81154560; PMCID: EFR7290501.  Pardasaney, MYRTIS LOIS Bonus, GEANNIE POUR., et al. (2012). Sensitivity to change and responsiveness of four balance measures for community-dwelling older adults. Physical therapy 92(3): 388-397.                                                                                                                                    TREATMENT DATE: 10/27/23  Aquatic therapy at [facility] - pool temperature 92 degrees   Patient seen for aquatic therapy today.  Treatment took place in water  3.6-4.8 feet deep depending upon activity.  Patient entered and exited the pool via stairs w/ Bil rails using step to pattern at SBA level.   Exercises: Water  walking warmup unsupported - 4x18 ft forward > backward > laterally Lateral squat walking w/ low resistance DB abd/add 4x18 ft each direction STS w/ low resistance DB press down 2x10 for core and immediate standing balance Staggered squats x10 each LE in rear (used bench for depth cue), she reports relief in her low back during task Forward lunges at pool wall in reduced ROM x10 each side, no pain during task Multicolor low resistance DB bilaterally for increased core engagement: Shoulder flexion  x20 Shoulder abduction x20 Chest press x20 for increased postural stability, pt requires light posterior support on pool wall midway through reps due to core demand Horizontal abduction/adduction x20, pt reports neck relief due to stretching  Patient requires buoyancy of the water  for support for reduced  fall risk with gait training and balance exercises without physical support, but return demo and verbal cuing. Exercises able to be performed safely in water  without the risk of fall compared to those same exercises performed on land; viscosity of water  needed for resistance for strengthening. Current of water  provides perturbations for challenging static and dynamic balance.   PATIENT EDUCATION: Education details: Aquatic rationale and goals of interventions today, plan for aquatic HEP near last aquatic visit.  Possible Therapist, nutritional. Person educated: Patient Education method: Chief Technology Officer Education comprehension: verbalized understanding  HOME EXERCISE PROGRAM: Access Code: [email or web address] URL: [email or web address] Date: 09/13/2023 Prepared by: Kelly Halpin  Exercises - Supine Posterior Pelvic Tilt  - 2-3 x weekly - 3 sets - 10 reps - Hooklying Isometric Hip Flexion  - 1 x daily - 2-3 x weekly - 3 sets - 10 reps - Supine Quad Set  - 1 x daily - 2-3 x weekly - 3 sets - 10 reps  GOALS: Goals reviewed with patient? Yes  SHORT TERM GOALS: Target date: 10/11/2023    Patient will be independent in HEP to improve functional outcomes Baseline: Goal status: IN PROGRESS, 10/25/2023   2.  Demo improved BLE strength and balance per time < 20 sec 5xSTS test Baseline: 27 sec Goal status:  NOT MET, 10/25/2023  3.  Independent ambulation uneven ground to return to gardening activities Baseline: CGA; per report-not using cane-hasn't gone in the yard 10/25/2023 (could not assess due to rain today) Goal status:  DEFERRED, pt reports they have moved gardening area and she  does not want to work on this 10/25/2023    LONG TERM GOALS: Target date: 11/08/2023>UPDATED TARGET 11/17/2023    Independent w/ advanced HEP to include land and aquatic-based elements to prepare for D/C Baseline:  Goal status:  IN PROGRESS   2.  Modified independent floor/ground to stand to improve mobility and safety with gardening activities Baseline: NT Goal status:  IN PROGRESS   3.  Demo low risk for falls per score 20/24 Dynamic Gait Index to improve safety with mobility Baseline:  Goal status:  IN PROGRESS   4.  Improve gait speed to 2.8 ft/sec to improve efficiency community ambulation Baseline: 2.12 no AD>2.83 ft/sec Goal status: MET, 10/25/2023  5.  Improve RLE strength to 5/5 for improved single limb support/stability Baseline: 4/5 Goal status:  IN PROGRESS   6.  Back/right knee pain not to exceed 3/10 with exercise/functional mobility/activities Baseline: 6/10 Goal status:  IN PROGRESS   ASSESSMENT:  CLINICAL IMPRESSION: Pt seen for follow-up aquatic session at [facility] facility today.  She continues to have high level knee pain, but positive pain response to aquatic modality.  She is able to perform more repetitions and increased distance of mobility related activities prior to need for rest and with less pain irritability.  She would benefit from continued work on balance, core and BLE strength in aquatic setting.  PT to establish aquatic HEP towards end of POC.  Continue per POC.   OBJECTIVE IMPAIRMENTS: Abnormal gait, decreased activity tolerance, decreased balance, decreased mobility, difficulty walking, decreased strength, improper body mechanics, and pain.   ACTIVITY LIMITATIONS: carrying, lifting, bending, stairs, transfers, and locomotion level  PARTICIPATION LIMITATIONS: meal prep, cleaning, laundry, community activity, occupation, and yard work  PERSONAL FACTORS: Age, Time since onset of injury/illness/exacerbation, and 1-2 comorbidities: PMH are also affecting  patient's functional outcome.   REHAB POTENTIAL: Excellent  CLINICAL DECISION MAKING: Evolving/moderate complexity  EVALUATION COMPLEXITY: Moderate  PLAN:  PT FREQUENCY: 1-2x/week  PT DURATION: 4 weeks  PLANNED INTERVENTIONS: 97750- Physical Performance Testing, 97110-Therapeutic exercises, 97530- Therapeutic activity, 97112- Neuromuscular re-education, [phone]- Self Care, 02859- Manual therapy, [phone]- Gait training, [phone]- Aquatic Therapy, [phone]- Electrical stimulation (unattended), and 20560 (1-2 muscles), 20561 (3+ muscles)- Dry Needling  PLAN FOR NEXT SESSION: pt to continue aquatic sessions and return to clinic 8/28 for LTG check and planned discharge then  HEP review/progressions, balance+strength activities, floor to stand/sit  Will need aquatic HEP by 8/19 appt!   AQUATICS: Frequency: 1 Duration: 4 Special Instruction: chronic knee and LBP- pt with good experience with aquatic therapy in the past    Daved Bull, PT, DPT

## 2023-10-31 NOTE — Telephone Encounter (Signed)
 LVM letting the patient know that she has been approved for gel injection Monovisc right nee and to please call back and ask for April if she is still interested

## 2023-11-01 ENCOUNTER — Ambulatory Visit: Admitting: Rehabilitation

## 2023-11-01 ENCOUNTER — Ambulatory Visit: Payer: Self-pay | Admitting: Cardiovascular Disease

## 2023-11-01 ENCOUNTER — Encounter (HOSPITAL_BASED_OUTPATIENT_CLINIC_OR_DEPARTMENT_OTHER): Payer: Self-pay | Admitting: Physical Therapy

## 2023-11-01 ENCOUNTER — Ambulatory Visit (HOSPITAL_BASED_OUTPATIENT_CLINIC_OR_DEPARTMENT_OTHER): Attending: Physical Medicine and Rehabilitation | Admitting: Physical Therapy

## 2023-11-01 DIAGNOSIS — M5459 Other low back pain: Secondary | ICD-10-CM | POA: Insufficient documentation

## 2023-11-01 DIAGNOSIS — G8929 Other chronic pain: Secondary | ICD-10-CM | POA: Insufficient documentation

## 2023-11-01 DIAGNOSIS — M6281 Muscle weakness (generalized): Secondary | ICD-10-CM | POA: Diagnosis present

## 2023-11-01 DIAGNOSIS — M25561 Pain in right knee: Secondary | ICD-10-CM | POA: Diagnosis present

## 2023-11-01 DIAGNOSIS — R2681 Unsteadiness on feet: Secondary | ICD-10-CM | POA: Diagnosis present

## 2023-11-01 NOTE — Therapy (Signed)
 OUTPATIENT PHYSICAL THERAPY NEURO TREATMENT   Patient Name: Amy Hooper MRN: [id number] DOB:11-08-1950, 73 y.o., female Today's Date: 11/01/2023   PCP: Oris Camie BRAVO, NP REFERRING PROVIDER: Lorilee Sven SQUIBB, MD  END OF SESSION:  PT End of Session - 11/01/23 1627     Visit Number 7    Number of Visits 13    Date for PT Re-Evaluation 11/17/23    Progress Note Due on Visit 10    PT Start Time 1615    PT Stop Time 1653    PT Time Calculation (min) 38 min    Activity Tolerance Patient tolerated treatment well    Behavior During Therapy [facility] for tasks assessed/performed            Past Medical History:  Diagnosis Date   Allergy 1976   Tramadol  NSAIDS   Chronic kidney disease    CVA (cerebrovascular accident) (HCC) 08/18/2023   Depression    Fibromyalgia    Gout    HTN (hypertension)    Hyperlipidemia 09/01/22   Insomnia    Morbid obesity (HCC)    Morbid obesity (HCC)    Nocturnal leg cramps 07/27/2023   Non-recurrent acute suppurative otitis media of left ear without spontaneous rupture of tympanic membrane 04/30/2022   Osteoarthritis    Palpitation    Palpitations 04/22/2014   Rash and nonspecific skin eruption 07/23/2022   Sleep apnea    Stroke (HCC) 08/23/2023   Past Surgical History:  Procedure Laterality Date   APPLICATION OF WOUND VAC N/A 03/25/2021   Procedure: APPLICATION OF WOUND VAC;  Surgeon: Barbarann Oneil BROCKS, MD;  Location: [facility] OR;  Service: Orthopedics;  Laterality: N/A;   APPLICATION OF WOUND VAC N/A 03/30/2021   Procedure: WOUND VAC CHANGE 12x6x5;  Surgeon: Barbarann Oneil BROCKS, MD;  Location: [facility] OR;  Service: Orthopedics;  Laterality: N/A;   INCISION AND DRAINAGE OF WOUND N/A 03/25/2021   Procedure: LUMBAR POST OP INCISION IRRIGATION;  Surgeon: Barbarann Oneil BROCKS, MD;  Location: [facility] OR;  Service: Orthopedics;  Laterality: N/A;   JOINT REPLACEMENT     KNEE ARTHROSCOPY     LOOP RECORDER INSERTION N/A 08/23/2023   Procedure: LOOP RECORDER INSERTION;  Surgeon:  Nancey Eulas BRAVO, MD;  Location: [facility] INV[facility];  Service: Cardiovascular;  Laterality: N/A;   LUMBAR WOUND DEBRIDEMENT N/A 03/30/2021   Procedure: REPEAT LUMBAR WOUND DEBRIDEMENT;  Surgeon: Barbarann Oneil BROCKS, MD;  Location: [facility] OR;  Service: Orthopedics;  Laterality: N/A;   right rotator cuff     ROTATOR CUFF REPAIR Left    TOTAL KNEE ARTHROPLASTY Left 09/08/2020   Procedure: LEFT TOTAL KNEE ARTHROPLASTY;  Surgeon: Jerri Kay HERO, MD;  Location: [facility] OR;  Service: Orthopedics;  Laterality: Left;   TUBAL LIGATION     Patient Active Problem List   Diagnosis Date Noted   History of stroke 10/24/2023   Abnormal weight gain 10/24/2023   Statin myopathy 10/24/2023   Obesity (BMI 30-39.9) 08/18/2023   Premature atrial complexes 12/20/2022   Forearm tendonitis 07/23/2022   Asymptomatic bradycardia 07/23/2022   Rheumatoid arthritis with rheumatoid factor of multiple sites without organ or systems involvement (HCC) 04/30/2022   Osteopenia 03/03/2022   Degenerative spondylolisthesis 02/19/2021   Status post total left knee replacement 09/08/2020   OSA (obstructive sleep apnea) 05/19/2020   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/17/2020   Chronic pain syndrome 03/03/2020   S/P lumbar fusion 02/05/2020   Vitamin D  deficiency 01/09/2020   Hyperlipidemia 01/09/2020   CKD (  chronic kidney disease) stage 3, GFR 30-59 ml/min (HCC) 01/09/2020   Essential hypertension    Gout    Fibromyalgia    Depression    Insomnia    Chronically low serum potassium 04/22/2014   Environmental and seasonal allergies 04/22/2014    ONSET DATE: 08/23/23  REFERRING DIAG:  I63.9 (ICD-10-CM) - Cerebrovascular accident (CVA), unspecified mechanism (HCC)    THERAPY DIAG:  Unsteadiness on feet  Other low back pain  Muscle weakness (generalized)  Chronic pain of right knee  Rationale for Evaluation and Treatment: Rehabilitation  SUBJECTIVE:                                                                                                                                                                                              SUBJECTIVE STATEMENT: Pt reports that she could feel her legs after last session.  She plans to call dr for Rt knee replacement soon.    POOL ACCESS: husband and pt plan to join [employer] in coming weeks.   Pt accompanied by: self  PERTINENT HISTORY: History of HTN, T2DM, OSA, CKD 3B, fibromyalgia who was admitted on 08/18/2023 with reports of funny feeling oral, progressive RUE weakness at night prior to admission the next day with RUE weakness on awakening. 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 MRI brain showed area of small acute ischemia within posterior paramedian left frontal lobe and multifocal hyperintense T2 weighted signals within white matter. Dr. Jerri felt the stroke was of unclear etiology question cardiac source and loop recorder was placed at discharge Hx of lumbar surgery (fusion?) with infection complications.  PAIN:  Are you having pain? no: NPRS scale: 0/10 Pain location: central lumbar; right knee medial compartment Pain description:  Aggravating factors: standing, upright Relieving factors: rest,   PRECAUTIONS: Fall  RED FLAGS: None   WEIGHT BEARING RESTRICTIONS: No  FALLS: Has patient fallen in last 6 months? No  LIVING ENVIRONMENT: Lives with:  Lives in: House/apartment Stairs: 7 steps to enter, ground floor Has following equipment at home: Single point cane  PLOF: Independent, Independent with basic ADLs, and Independent with household mobility without device  PATIENT GOALS:   OBJECTIVE:   Note: Objective measures were completed at Evaluation unless otherwise noted.  DIAGNOSTIC FINDINGS:   COGNITION: Overall cognitive status: Within functional limits for tasks assessed   SENSATION: Numbness along right lateral foot (since back surgery)  COORDINATION:  WNL  MUSCLE TONE:  WNL      POSTURE: increased lumbar lordosis  LOWER EXTREMITY ROM:     Active  Right Eval Left Eval  Hip flexion 100+ 100+  Hip extension    Hip abduction    Hip adduction    Hip internal rotation    Hip external rotation    Knee flexion 115 115  Knee extension -5 0  Ankle dorsiflexion 7 14  Ankle plantarflexion    Ankle inversion    Ankle eversion     (Blank rows = not tested)  LOWER EXTREMITY MMT:  resisted tests in sitting  MMT Right Eval Left Eval  Hip flexion 4 5  Hip extension    Hip abduction 4 4  Hip adduction    Hip internal rotation    Hip external rotation    Knee flexion 4 5  Knee extension 4 5  Ankle dorsiflexion 3+ 4  Ankle plantarflexion 2+ 3-  Ankle inversion    Ankle eversion    (Blank rows = not tested)  Trunk flexion: 2+/5 Trunk extension: NT Single leg stance LLE: 10+ sec; RLE: 5-8 sec  BED MOBILITY:  indep  TRANSFERS: Sit-stand and chair-chair independent Floor to chair/stand: DNT   CURB:  Findings: modified indep  STAIRS: Findings: Comments: modified indep step-to pattern GAIT: Findings: Distance walked:   and Comments: mildly antalgic RLE Independent-mod I level surfaces; CGA uneven FUNCTIONAL TESTS:  5 times sit to stand: 27 sec 2 minute walk test:  10 meter walk test: 15.4 sec = 2.12 ft/sec  11/01/23:  5x STS= 18.56 sec  M-CTSIB  Condition 1: Firm Surface, EO 30 Sec, Normal Sway  Condition 2: Firm Surface, EC 30 Sec, Normal Sway  Condition 3: Foam Surface, EO  Sec,  Sway  Condition 4: Foam Surface, EC  Sec,  Sway    Dynamic Gait Index: Dynamic Gait Index  Mark the lowest level that applies.   Date Performed 6/24  Gait level surface (2) Mild Impairment: Walks 20', uses AD, slower speed, mild gait deviations  2. Change in gait speed (3) Normal: Able to smoothly change walking speed without loss of balance or gait deviation. Shows a significant difference in walking speeds between normal, fast and slow speeds  3.  Gait with horizontal head turns (2) Mild Impairment: Performs head turns smoothly with slight change in gait velocity, i.e., minor disruption to smooth gait path or uses walking aid  4. Gait with vertical head turns (2) Mild Impairment: Performs head turns smoothly with slight change in gait velocity, i.e., minor disruption to smooth gait path or uses walking aid  5. Gait and pivot turn (2) Mild Impairment: Pivot turns safely in > 3 seconds and stops with no loss of balance  6. Step over obstacle (2) Mild Impairment: Is able to step over box, but must slow down and adjust steps to clear box safely  7. Step around obstacle (3) Normal: Is able to walk around cones safely without changing gait speed; no evidence of imbalance  8. Steps (1) Moderate Impairment: Two feet to a stair, must use rail  Total score 17/24    Score Interpretation: Score of <19 indicates high risk of falls.  Minimally Clinically Important Difference (MCID):  =DGI scores of<21/24 = 1.80 points DGI scores of >21/24 = 0.60 points   Judsonia T, Inbar-Borovsky N, Brozgol M, Giladi N, [place] JM. The Dynamic Gait Index in healthy older adults: the role of stair climbing, fear of falling and gender. Gait Posture. 2009 Feb;29(2):237-41. doi: 10.1016/j.gaitpost.2008.08.013. Epub 2008 Oct 8. PMID: 81154560; PMCID: EFR7290501.  Pardasaney, MYRTIS LOIS Bonus, GEANNIE POUR., et al. (2012). Sensitivity to change and  responsiveness of four balance measures for community-dwelling older adults. Physical therapy 92(3): 388-397.                                                                                                                                    TREATMENT DATE: 11/01/23  [facility] Adult PT Treatment:                                                DATE: 11/01/23 Pt seen for aquatic therapy today.  Treatment took place in water  3.5-4.75 ft in depth at the [facility] pool. Temp of water  was 91.  Pt entered/exited the pool via stairs  independently with bil rail. Exercises: Water  walking warmup unsupported - 4x18 ft forward / backward / side stepping  Side stepping with arm abd/add with rainbow hand floats 4x18 ft each direction Suitcase carry with bil rainbow hand floats and marching forward/ backward (crunching in knee) -> single yellow hand float at side and walking forward/backward  Tandem gait forward/backward hands on top of water -> hands out of water  Squats pushing rainbow -> yellow hand float under water  x 12 Staggered stance with bil shoulder arm horz abdct/add with yellow hand floats [place]/braiding L/R with UE on hand floats Straddling noodle and cycling, unsupported  Pt requires the buoyancy and hydrostatic pressure of water  for support, and to offload joints by unweighting joint load by at least 50 % in navel deep water  and by at least 75-80% in chest to neck deep water .  Viscosity of the water  is needed for resistance of strengthening. Water  current perturbations provides challenge to standing balance requiring increased core activation. Exercises able to be performed safely in water  without the risk of fall compared to those same exercises performed on land  PATIENT EDUCATION: Education details: Aquatic rationale and goals of interventions today,  Person educated: Patient Education method: Explanation  Education comprehension: verbalized understanding  HOME EXERCISE PROGRAM: Access Code: [facility] URL: [email or web address] Date: 09/13/2023 Prepared by: Burnard Sandifer  Exercises - Supine Posterior Pelvic Tilt  - 2-3 x weekly - 3 sets - 10 reps - Hooklying Isometric Hip Flexion  - 1 x daily - 2-3 x weekly - 3 sets - 10 reps - Supine Quad Set  - 1 x daily - 2-3 x weekly - 3 sets - 10 reps  Previous Aquatic Access Code: [email or web address] URL: [email or web address] (issued 02/2022)   GOALS: Goals reviewed with patient? Yes  SHORT TERM GOALS: Target date: 10/11/2023    Patient  will be independent in HEP to improve functional outcomes Baseline: Goal status: IN PROGRESS, 10/25/2023   2.  Demo improved BLE strength and balance per time < 20 sec 5xSTS test Baseline: see above Goal status:  MET - 11/01/23  3.  Independent ambulation uneven ground to return to  gardening activities Baseline: CGA; per report-not using cane-hasn't gone in the yard 10/25/2023 (could not assess due to rain today) Goal status:  DEFERRED, pt reports they have moved gardening area and she does not want to work on this 10/25/2023    LONG TERM GOALS: Target date: 11/08/2023>UPDATED TARGET 11/17/2023    Independent w/ advanced HEP to include land and aquatic-based elements to prepare for D/C Baseline:  Goal status:  IN PROGRESS   2.  Modified independent floor/ground to stand to improve mobility and safety with gardening activities Baseline: NT Goal status:  IN PROGRESS   3.  Demo low risk for falls per score 20/24 Dynamic Gait Index to improve safety with mobility Baseline:  Goal status:  IN PROGRESS   4.  Improve gait speed to 2.8 ft/sec to improve efficiency community ambulation Baseline: 2.12 no AD>2.83 ft/sec Goal status: MET, 10/25/2023  5.  Improve RLE strength to 5/5 for improved single limb support/stability Baseline: 4/5 Goal status:  IN PROGRESS   6.  Back/right knee pain not to exceed 3/10 with exercise/functional mobility/activities Baseline: 6/10 Goal status:  IN PROGRESS   ASSESSMENT:  CLINICAL IMPRESSION: Good tolerance for progression of aquatic exercises this date.    She would benefit from continued work on balance, core and BLE strength in aquatic setting.  Continue per POC. Will continue aquatic therapy appts in August, and d/c PT end of August, to transition to [facility] for fitness. Pt has met STG2.   OBJECTIVE IMPAIRMENTS: Abnormal gait, decreased activity tolerance, decreased balance, decreased mobility, difficulty walking, decreased strength, improper body  mechanics, and pain.   ACTIVITY LIMITATIONS: carrying, lifting, bending, stairs, transfers, and locomotion level  PARTICIPATION LIMITATIONS: meal prep, cleaning, laundry, community activity, occupation, and yard work  PERSONAL FACTORS: Age, Time since onset of injury/illness/exacerbation, and 1-2 comorbidities: PMH are also affecting patient's functional outcome.   REHAB POTENTIAL: Excellent  CLINICAL DECISION MAKING: Evolving/moderate complexity  EVALUATION COMPLEXITY: Moderate  PLAN:  PT FREQUENCY: 1-2x/week  PT DURATION: 4 weeks  PLANNED INTERVENTIONS: 97750- Physical Performance Testing, 97110-Therapeutic exercises, 97530- Therapeutic activity, [id number]- Neuromuscular re-education, 97535- Self Care, [id number]- Manual therapy, [phone]- Gait training, [phone]- Aquatic Therapy, [phone]- Electrical stimulation (unattended), and 20560 (1-2 muscles), 20561 (3+ muscles)- Dry Needling  PLAN FOR NEXT SESSION: pt to continue aquatic sessions and return to clinic 8/28 for LTG check and planned discharge then  HEP review/progressions, balance+strength activities, floor to stand/sit  Will need aquatic HEP by 8/19 appt!   AQUATICS: Frequency: 1 Duration: 4 Special Instruction: chronic knee and LBP- pt with good experience with aquatic therapy in the past

## 2023-11-03 ENCOUNTER — Ambulatory Visit

## 2023-11-08 ENCOUNTER — Encounter (HOSPITAL_BASED_OUTPATIENT_CLINIC_OR_DEPARTMENT_OTHER): Payer: Self-pay | Admitting: Physical Therapy

## 2023-11-08 ENCOUNTER — Ambulatory Visit (HOSPITAL_BASED_OUTPATIENT_CLINIC_OR_DEPARTMENT_OTHER): Admitting: Physical Therapy

## 2023-11-08 ENCOUNTER — Ambulatory Visit: Admitting: Rehabilitation

## 2023-11-08 DIAGNOSIS — R2681 Unsteadiness on feet: Secondary | ICD-10-CM

## 2023-11-08 DIAGNOSIS — M6281 Muscle weakness (generalized): Secondary | ICD-10-CM

## 2023-11-08 DIAGNOSIS — M5459 Other low back pain: Secondary | ICD-10-CM

## 2023-11-08 NOTE — Therapy (Signed)
 OUTPATIENT PHYSICAL THERAPY NEURO TREATMENT   Patient Name: Amy Hooper MRN: [id number] DOB:06-10-1950, 73 y.o., female Today's Date: 11/08/2023   PCP: Oris Camie BRAVO, NP REFERRING PROVIDER: Lorilee Sven SQUIBB, MD  END OF SESSION:  PT End of Session - 11/08/23 1114     Visit Number 8    Number of Visits 13    Date for PT Re-Evaluation 11/17/23    Authorization Type Medicare    Progress Note Due on Visit 10    PT Start Time 1101    PT Stop Time 1140    PT Time Calculation (min) 39 min    Activity Tolerance Patient tolerated treatment well    Behavior During Therapy [facility] for tasks assessed/performed             Past Medical History:  Diagnosis Date   Allergy 1976   Tramadol  NSAIDS   Chronic kidney disease    CVA (cerebrovascular accident) (HCC) 08/18/2023   Depression    Fibromyalgia    Gout    HTN (hypertension)    Hyperlipidemia 09/01/22   Insomnia    Morbid obesity (HCC)    Morbid obesity (HCC)    Nocturnal leg cramps 07/27/2023   Non-recurrent acute suppurative otitis media of left ear without spontaneous rupture of tympanic membrane 04/30/2022   Osteoarthritis    Palpitation    Palpitations 04/22/2014   Rash and nonspecific skin eruption 07/23/2022   Sleep apnea    Stroke (HCC) 08/23/2023   Past Surgical History:  Procedure Laterality Date   APPLICATION OF WOUND VAC N/A 03/25/2021   Procedure: APPLICATION OF WOUND VAC;  Surgeon: Barbarann Oneil BROCKS, MD;  Location: [facility] OR;  Service: Orthopedics;  Laterality: N/A;   APPLICATION OF WOUND VAC N/A 03/30/2021   Procedure: WOUND VAC CHANGE 12x6x5;  Surgeon: Barbarann Oneil BROCKS, MD;  Location: [facility] OR;  Service: Orthopedics;  Laterality: N/A;   INCISION AND DRAINAGE OF WOUND N/A 03/25/2021   Procedure: LUMBAR POST OP INCISION IRRIGATION;  Surgeon: Barbarann Oneil BROCKS, MD;  Location: [facility] OR;  Service: Orthopedics;  Laterality: N/A;   JOINT REPLACEMENT     KNEE ARTHROSCOPY     LOOP RECORDER INSERTION N/A 08/23/2023   Procedure:  LOOP RECORDER INSERTION;  Surgeon: Nancey Eulas BRAVO, MD;  Location: [facility] INVASIVE CV LAB;  Service: Cardiovascular;  Laterality: N/A;   LUMBAR WOUND DEBRIDEMENT N/A 03/30/2021   Procedure: REPEAT LUMBAR WOUND DEBRIDEMENT;  Surgeon: Barbarann Oneil BROCKS, MD;  Location: [facility] OR;  Service: Orthopedics;  Laterality: N/A;   right rotator cuff     ROTATOR CUFF REPAIR Left    TOTAL KNEE ARTHROPLASTY Left 09/08/2020   Procedure: LEFT TOTAL KNEE ARTHROPLASTY;  Surgeon: Jerri Kay HERO, MD;  Location: [facility] OR;  Service: Orthopedics;  Laterality: Left;   TUBAL LIGATION     Patient Active Problem List   Diagnosis Date Noted   History of stroke 10/24/2023   Abnormal weight gain 10/24/2023   Statin myopathy 10/24/2023   Obesity (BMI 30-39.9) 08/18/2023   Premature atrial complexes 12/20/2022   Forearm tendonitis 07/23/2022   Asymptomatic bradycardia 07/23/2022   Rheumatoid arthritis with rheumatoid factor of multiple sites without organ or systems involvement (HCC) 04/30/2022   Osteopenia 03/03/2022   Degenerative spondylolisthesis 02/19/2021   Status post total left knee replacement 09/08/2020   OSA (obstructive sleep apnea) 05/19/2020   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/17/2020   Chronic pain syndrome 03/03/2020   S/P lumbar fusion 02/05/2020   Vitamin D  deficiency 01/09/2020  Hyperlipidemia 01/09/2020   CKD (chronic kidney disease) stage 3, GFR 30-59 ml/min (HCC) 01/09/2020   Essential hypertension    Gout    Fibromyalgia    Depression    Insomnia    Chronically low serum potassium 04/22/2014   Environmental and seasonal allergies 04/22/2014    ONSET DATE: 08/23/23  REFERRING DIAG:  I63.9 (ICD-10-CM) - Cerebrovascular accident (CVA), unspecified mechanism (HCC)    THERAPY DIAG:  Unsteadiness on feet  Other low back pain  Muscle weakness (generalized)  Rationale for Evaluation and Treatment: Rehabilitation  SUBJECTIVE:                                                                                                                                                                                              SUBJECTIVE STATEMENT: Pt reports 0/10 pain today.  Yesterday was bad 9/10 entire body)  POOL ACCESS: husband and pt plan to join [facility] in coming weeks.   Pt accompanied by: self  PERTINENT HISTORY: History of HTN, T2DM, OSA, CKD 3B, fibromyalgia who was admitted on 08/18/2023 with reports of funny feeling oral, progressive RUE weakness at night prior to admission the next day with RUE weakness on awakening. 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 MRI brain showed area of small acute ischemia within posterior paramedian left frontal lobe and multifocal hyperintense T2 weighted signals within white matter. Dr. Jerri felt the stroke was of unclear etiology question cardiac source and loop recorder was placed at discharge Hx of lumbar surgery (fusion?) with infection complications.  PAIN:  Are you having pain? no: NPRS scale: 0/10 Pain location: central lumbar; right knee medial compartment Pain description:  Aggravating factors: standing, upright Relieving factors: rest,   PRECAUTIONS: Fall  RED FLAGS: None   WEIGHT BEARING RESTRICTIONS: No  FALLS: Has patient fallen in last 6 months? No  LIVING ENVIRONMENT: Lives with:  Lives in: House/apartment Stairs: 7 steps to enter, ground floor Has following equipment at home: Single point cane  PLOF: Independent, Independent with basic ADLs, and Independent with household mobility without device  PATIENT GOALS:   OBJECTIVE:   Note: Objective measures were completed at Evaluation unless otherwise noted.  DIAGNOSTIC FINDINGS:   COGNITION: Overall cognitive status: Within functional limits for tasks assessed   SENSATION: Numbness along right lateral foot (since back surgery)  COORDINATION:  WNL  MUSCLE TONE: WNL      POSTURE: increased lumbar  lordosis  LOWER EXTREMITY ROM:     Active  Right Eval Left Eval  Hip flexion 100+ 100+  Hip extension    Hip abduction  Hip adduction    Hip internal rotation    Hip external rotation    Knee flexion 115 115  Knee extension -5 0  Ankle dorsiflexion 7 14  Ankle plantarflexion    Ankle inversion    Ankle eversion     (Blank rows = not tested)  LOWER EXTREMITY MMT:  resisted tests in sitting  MMT Right Eval Left Eval  Hip flexion 4 5  Hip extension    Hip abduction 4 4  Hip adduction    Hip internal rotation    Hip external rotation    Knee flexion 4 5  Knee extension 4 5  Ankle dorsiflexion 3+ 4  Ankle plantarflexion 2+ 3-  Ankle inversion    Ankle eversion    (Blank rows = not tested)  Trunk flexion: 2+/5 Trunk extension: NT Single leg stance LLE: 10+ sec; RLE: 5-8 sec  BED MOBILITY:  indep  TRANSFERS: Sit-stand and chair-chair independent Floor to chair/stand: DNT   CURB:  Findings: modified indep  STAIRS: Findings: Comments: modified indep step-to pattern GAIT: Findings: Distance walked:   and Comments: mildly antalgic RLE Independent-mod I level surfaces; CGA uneven FUNCTIONAL TESTS:  5 times sit to stand: 27 sec 2 minute walk test:  10 meter walk test: 15.4 sec = 2.12 ft/sec  11/01/23:  5x STS= 18.56 sec  M-CTSIB  Condition 1: Firm Surface, EO 30 Sec, Normal Sway  Condition 2: Firm Surface, EC 30 Sec, Normal Sway  Condition 3: Foam Surface, EO  Sec,  Sway  Condition 4: Foam Surface, EC  Sec,  Sway    Dynamic Gait Index: Dynamic Gait Index  Mark the lowest level that applies.   Date Performed 6/24  Gait level surface (2) Mild Impairment: Walks 20', uses AD, slower speed, mild gait deviations  2. Change in gait speed (3) Normal: Able to smoothly change walking speed without loss of balance or gait deviation. Shows a significant difference in walking speeds between normal, fast and slow speeds  3. Gait with horizontal head turns (2) Mild  Impairment: Performs head turns smoothly with slight change in gait velocity, i.e., minor disruption to smooth gait path or uses walking aid  4. Gait with vertical head turns (2) Mild Impairment: Performs head turns smoothly with slight change in gait velocity, i.e., minor disruption to smooth gait path or uses walking aid  5. Gait and pivot turn (2) Mild Impairment: Pivot turns safely in > 3 seconds and stops with no loss of balance  6. Step over obstacle (2) Mild Impairment: Is able to step over box, but must slow down and adjust steps to clear box safely  7. Step around obstacle (3) Normal: Is able to walk around cones safely without changing gait speed; no evidence of imbalance  8. Steps (1) Moderate Impairment: Two feet to a stair, must use rail  Total score 17/24    Score Interpretation: Score of <19 indicates high risk of falls.  Minimally Clinically Important Difference (MCID):  =DGI scores of<21/24 = 1.80 points DGI scores of >21/24 = 0.60 points   [place] T, Inbar-Borovsky N, Brozgol M, Giladi N, [place] JM. The Dynamic Gait Index in healthy older adults: the role of stair climbing, fear of falling and gender. Gait Posture. 2009 Feb;29(2):237-41. doi: 10.1016/j.gaitpost.2008.08.013. Epub 2008 Oct 8. PMID: 81154560; PMCID: EFR7290501.  Pardasaney, MYRTIS LOIS Bonus, GEANNIE POUR., et al. (2012). Sensitivity to change and responsiveness of four balance measures for community-dwelling older adults. Physical therapy 92(3): 388-397.  TREATMENT DATE: 11/01/23  [facility] Adult PT Treatment:                                                DATE: 11/08/23 Pt seen for aquatic therapy today.  Treatment took place in water  3.5-4.75 ft in depth at the [facility] pool. Temp of water  was 91.  Pt entered/exited the pool via stairs independently with bil  rail. Exercises: Exercises - Side Stepping with Hand Floats  - Braided Sidestepping  - Walking March   - Squat  2 x 10 reps - Backward Lunge with Forward Reach with Kickboard   - Upright Side Lunge  - Standing 'L' Stretch at [employer] - Single Leg Stance   - Seated Straddle on Flotation Forward Breast Stroke Arms and Bicycle Legs  Pt requires the buoyancy and hydrostatic pressure of water  for support, and to offload joints by unweighting joint load by at least 50 % in navel deep water  and by at least 75-80% in chest to neck deep water .  Viscosity of the water  is needed for resistance of strengthening. Water  current perturbations provides challenge to standing balance requiring increased core activation. Exercises able to be performed safely in water  without the risk of fall compared to those same exercises performed on land  PATIENT EDUCATION: Education details: Aquatic rationale and goals of interventions today,  Person educated: Patient Education method: Explanation  Education comprehension: verbalized understanding  HOME EXERCISE PROGRAM: Access Code: [facility] URL: [email or web address] Date: 09/13/2023 Prepared by: Burnard Sandifer  Exercises - Supine Posterior Pelvic Tilt  - 2-3 x weekly - 3 sets - 10 reps - Hooklying Isometric Hip Flexion  - 1 x daily - 2-3 x weekly - 3 sets - 10 reps - Supine Quad Set  - 1 x daily - 2-3 x weekly - 3 sets - 10 reps  Previous Aquatic Access Code: [email or web address] URL: [email or web address] (issued 02/2022) Re-issued 11/08/23   Exercises - Side Stepping with Hand Floats  - 1 x daily - 7 x weekly - 10 reps - Braided Sidestepping  - 1 x daily - 7 x weekly - 10 reps - Walking March  - 1 x daily - 7 x weekly - 10 reps - Squat  - 1 x daily - 7 x weekly - 3 sets - 10 reps - Backward Lunge with Forward Reach with Kickboard  - 1 x daily - 3 x weekly - 1-2 sets - 10 reps - Upright Side Lunge  - 1 x daily - 3 x weekly - 1-2 sets - 10  reps - Standing 'L' Stretch at Counter  - 1 x daily - 7 x weekly - 2 reps - 20 seconds  hold - Single Leg Stance  - 1 x daily - 7 x weekly - 1 sets - 2-3 reps - 15-30 seconds  hold - Seated Straddle on Flotation Forward Breast Stroke Arms and Bicycle Legs  - 1 x daily - 7 x weekly - 3 sets - 10 reps  GOALS: Goals reviewed with patient? Yes  SHORT TERM GOALS: Target date: 10/11/2023    Patient will be independent in HEP to improve functional outcomes Baseline: Goal status: IN PROGRESS, 10/25/2023 (Met in aquatics 11/08/23)  2.  Demo improved BLE strength and balance per time < 20 sec 5xSTS test Baseline: see above Goal status:  MET - 11/01/23  3.  Independent ambulation uneven ground to return to gardening activities Baseline: CGA; per report-not using cane-hasn't gone in the yard 10/25/2023 (could not assess due to rain today) Goal status:  DEFERRED, pt reports they have moved gardening area and she does not want to work on this 10/25/2023    LONG TERM GOALS: Target date: 11/08/2023>UPDATED TARGET 11/17/2023    Independent w/ advanced HEP to include land and aquatic-based elements to prepare for D/C Baseline:  Goal status:  IN PROGRESS (Met in aquatics 11/08/23)  2.  Modified independent floor/ground to stand to improve mobility and safety with gardening activities Baseline: NT Goal status:  IN PROGRESS   3.  Demo low risk for falls per score 20/24 Dynamic Gait Index to improve safety with mobility Baseline:  Goal status:  IN PROGRESS   4.  Improve gait speed to 2.8 ft/sec to improve efficiency community ambulation Baseline: 2.12 no AD>2.83 ft/sec Goal status: MET, 10/25/2023  5.  Improve RLE strength to 5/5 for improved single limb support/stability Baseline: 4/5 Goal status:  IN PROGRESS   6.  Back/right knee pain not to exceed 3/10 with exercise/functional mobility/activities Baseline: 6/10 Goal status:  IN PROGRESS   ASSESSMENT:  CLINICAL IMPRESSION: Final aquatic session  today.  She is re-issued and instructed on aquatic HEP.  She demonstrates good understanding and indep with.  She is planning on participating in water  aerobic classes at [facility] once physical therapy has ended.  Instruction given to complete either the aquatic HEP or aerobic class not both.  She VU. She reports no falls and 0/10 pain today.  Good progress towards goals.  She will continue with land based therapy with tentative pending.   OBJECTIVE IMPAIRMENTS: Abnormal gait, decreased activity tolerance, decreased balance, decreased mobility, difficulty walking, decreased strength, improper body mechanics, and pain.   ACTIVITY LIMITATIONS: carrying, lifting, bending, stairs, transfers, and locomotion level  PARTICIPATION LIMITATIONS: meal prep, cleaning, laundry, community activity, occupation, and yard work  PERSONAL FACTORS: Age, Time since onset of injury/illness/exacerbation, and 1-2 comorbidities: PMH are also affecting patient's functional outcome.   REHAB POTENTIAL: Excellent  CLINICAL DECISION MAKING: Evolving/moderate complexity  EVALUATION COMPLEXITY: Moderate  PLAN:  PT FREQUENCY: 1-2x/week  PT DURATION: 4 weeks  PLANNED INTERVENTIONS: 97750- Physical Performance Testing, 97110-Therapeutic exercises, 97530- Therapeutic activity, [id number]- Neuromuscular re-education, 97535- Self Care, [id number]- Manual therapy, [phone]- Gait training, [phone]- Aquatic Therapy, [phone]- Electrical stimulation (unattended), and 20560 (1-2 muscles), 20561 (3+ muscles)- Dry Needling  PLAN FOR NEXT SESSION: pt to continue aquatic sessions and return to clinic 8/28 for LTG check and planned discharge then  HEP review/progressions, balance+strength activities, floor to stand/sit     AQUATICS: Frequency: 1 Duration: 4 Special Instruction: chronic knee and LBP- pt with good experience with aquatic therapy in the past

## 2023-11-10 ENCOUNTER — Ambulatory Visit

## 2023-11-15 ENCOUNTER — Ambulatory Visit: Admitting: Rehabilitation

## 2023-11-15 ENCOUNTER — Other Ambulatory Visit: Payer: Self-pay | Admitting: Nurse Practitioner

## 2023-11-15 DIAGNOSIS — I10 Essential (primary) hypertension: Secondary | ICD-10-CM

## 2023-11-15 MED ORDER — WEGOVY 0.5 MG/0.5ML ~~LOC~~ SOAJ: 0.5000 mg | SUBCUTANEOUS | 0 refills | Status: DC

## 2023-11-15 MED ORDER — LOSARTAN POTASSIUM-HCTZ 50-12.5 MG PO TABS: 1.0000 | ORAL_TABLET | Freq: Every day | ORAL | 1 refills | Status: DC

## 2023-11-15 NOTE — Telephone Encounter (Signed)
 See above.

## 2023-11-16 ENCOUNTER — Other Ambulatory Visit: Payer: Self-pay | Admitting: Nurse Practitioner

## 2023-11-16 ENCOUNTER — Encounter: Payer: Self-pay | Admitting: Nurse Practitioner

## 2023-11-16 DIAGNOSIS — I10 Essential (primary) hypertension: Secondary | ICD-10-CM

## 2023-11-16 MED ORDER — LOSARTAN POTASSIUM 100 MG PO TABS: 100.0000 mg | ORAL_TABLET | Freq: Every day | ORAL | 3 refills | Status: AC

## 2023-11-17 ENCOUNTER — Other Ambulatory Visit: Payer: Self-pay | Admitting: Physical Medicine and Rehabilitation

## 2023-11-17 ENCOUNTER — Ambulatory Visit

## 2023-11-17 DIAGNOSIS — R2681 Unsteadiness on feet: Secondary | ICD-10-CM | POA: Diagnosis not present

## 2023-11-17 DIAGNOSIS — M6281 Muscle weakness (generalized): Secondary | ICD-10-CM

## 2023-11-17 DIAGNOSIS — M5459 Other low back pain: Secondary | ICD-10-CM

## 2023-11-17 DIAGNOSIS — G8929 Other chronic pain: Secondary | ICD-10-CM

## 2023-11-17 NOTE — Therapy (Signed)
 OUTPATIENT PHYSICAL THERAPY NEURO TREATMENT and D/C Summary   Patient Name: Amy Hooper MRN: [id number] DOB:[birth year], 73 y.o., female Today's Date: 11/17/2023   PCP: Oris Camie BRAVO, NP REFERRING PROVIDER: Lorilee Sven SQUIBB, MD   PHYSICAL THERAPY DISCHARGE SUMMARY  Visits from Start of Care: 9  Current functional level related to goals / functional outcomes: Goals met with exception of back and right knee pain. Low risk for falls per outcome measures   Remaining deficits: Right knee and LBP   Education / Equipment: HEP and equipment recommendations for aquatic resistance exercise (hydrotone bells)   Patient agrees to discharge. Patient goals were met. Patient is being discharged due to being pleased with the current functional level.   END OF SESSION:  PT End of Session - 11/17/23 1016     Visit Number 9    Number of Visits 13    Date for PT Re-Evaluation 11/17/23    Authorization Type Medicare    Progress Note Due on Visit 10    PT Start Time 1015    PT Stop Time 1100    PT Time Calculation (min) 45 min    Activity Tolerance Patient tolerated treatment well    Behavior During Therapy [facility] for tasks assessed/performed             Past Medical History:  Diagnosis Date   Allergy 1976   Tramadol  NSAIDS   Chronic kidney disease    CVA (cerebrovascular accident) (HCC) 08/18/2023   Depression    Fibromyalgia    Gout    HTN (hypertension)    Hyperlipidemia 09/01/22   Insomnia    Morbid obesity (HCC)    Morbid obesity (HCC)    Nocturnal leg cramps 07/27/2023   Non-recurrent acute suppurative otitis media of left ear without spontaneous rupture of tympanic membrane 04/30/2022   Osteoarthritis    Palpitation    Palpitations 04/22/2014   Rash and nonspecific skin eruption 07/23/2022   Sleep apnea    Stroke (HCC) 08/23/2023   Past Surgical History:  Procedure Laterality Date   APPLICATION OF WOUND VAC N/A 03/25/2021   Procedure: APPLICATION OF  WOUND VAC;  Surgeon: Barbarann Oneil BROCKS, MD;  [facility];  Service: Orthopedics;  Laterality: N/A;   APPLICATION OF WOUND VAC N/A 03/30/2021   Procedure: WOUND VAC CHANGE 12x6x5;  Surgeon: Barbarann Oneil BROCKS, MD;  [facility];  Service: Orthopedics;  Laterality: N/A;   INCISION AND DRAINAGE OF WOUND N/A 03/25/2021   Procedure: LUMBAR POST OP INCISION IRRIGATION;  Surgeon: Barbarann Oneil BROCKS, MD;  [facility];  Service: Orthopedics;  Laterality: N/A;   JOINT REPLACEMENT     KNEE ARTHROSCOPY     LOOP RECORDER INSERTION N/A 08/23/2023   Procedure: LOOP RECORDER INSERTION;  Surgeon: Nancey Eulas BRAVO, MD;  [facility];  Service: Cardiovascular;  Laterality: N/A;   LUMBAR WOUND DEBRIDEMENT N/A 03/30/2021   Procedure: REPEAT LUMBAR WOUND DEBRIDEMENT;  Surgeon: Barbarann Oneil BROCKS, MD;  [facility];  Service: Orthopedics;  Laterality: N/A;   right rotator cuff     ROTATOR CUFF REPAIR Left    TOTAL KNEE ARTHROPLASTY Left 09/08/2020   Procedure: LEFT TOTAL KNEE ARTHROPLASTY;  Surgeon: Jerri Kay HERO, MD;  [facility];  Service: Orthopedics;  Laterality: Left;   TUBAL LIGATION     Patient Active Problem List   Diagnosis Date Noted   History of stroke 10/24/2023   Abnormal weight gain 10/24/2023   Statin myopathy 10/24/2023  Obesity (BMI 30-39.9) 08/18/2023   Premature atrial complexes 12/20/2022   Forearm tendonitis 07/23/2022   Asymptomatic bradycardia 07/23/2022   Rheumatoid arthritis with rheumatoid factor of multiple sites without organ or systems involvement (HCC) 04/30/2022   Osteopenia 03/03/2022   Degenerative spondylolisthesis 02/19/2021   Status post total left knee replacement 09/08/2020   OSA (obstructive sleep apnea) 05/19/2020   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/17/2020   Chronic pain syndrome 03/03/2020   S/P lumbar fusion 02/05/2020   Vitamin D  deficiency 01/09/2020   Hyperlipidemia 01/09/2020   CKD (chronic kidney disease) stage 3, GFR 30-59  ml/min (HCC) 01/09/2020   Essential hypertension    Gout    Fibromyalgia    Depression    Insomnia    Chronically low serum potassium 04/22/2014   Environmental and seasonal allergies 04/22/2014    ONSET DATE: 08/23/23  REFERRING DIAG:  I63.9 (ICD-10-CM) - Cerebrovascular accident (CVA), unspecified mechanism (HCC)    THERAPY DIAG:  Unsteadiness on feet  Other low back pain  Muscle weakness (generalized)  Chronic pain of right knee  Muscle weakness-general  Rationale for Evaluation and Treatment: Rehabilitation  SUBJECTIVE:                                                                                                                                                                                             SUBJECTIVE STATEMENT: Going to join [employer]  POOL ACCESS: husband and pt plan to join [employer] in coming weeks.   Pt accompanied by: self  PERTINENT HISTORY: History of HTN, T2DM, OSA, CKD 3B, fibromyalgia who was admitted on 08/18/2023 with reports of funny feeling oral, progressive RUE weakness at night prior to admission the next day with RUE weakness on awakening. 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 MRI brain showed area of small acute ischemia within posterior paramedian left frontal lobe and multifocal hyperintense T2 weighted signals within white matter. Dr. Jerri felt the stroke was of unclear etiology question cardiac source and loop recorder was placed at discharge Hx of lumbar surgery (fusion?) with infection complications.  PAIN:  Are you having pain? no: NPRS scale: 0/10 Pain location: central lumbar; right knee medial compartment Pain description:  Aggravating factors: standing, upright Relieving factors: rest,   PRECAUTIONS: Fall  RED FLAGS: None   WEIGHT BEARING RESTRICTIONS: No  FALLS: Has patient fallen in last 6 months? No  LIVING ENVIRONMENT: Lives with:  Lives in: House/apartment Stairs: 7  steps to enter, ground floor Has following equipment at home: Single point cane  PLOF: Independent, Independent with basic ADLs, and Independent with household  mobility without device  PATIENT GOALS:   OBJECTIVE:   TODAY'S TREATMENT: 11/17/23 Activity Comments  STG/LTG performance and review                     Note: Objective measures were completed at Evaluation unless otherwise noted.  DIAGNOSTIC FINDINGS:   COGNITION: Overall cognitive status: Within functional limits for tasks assessed   SENSATION: Numbness along right lateral foot (since back surgery)  COORDINATION:  WNL  MUSCLE TONE: WNL      POSTURE: increased lumbar lordosis  LOWER EXTREMITY ROM:     Active  Right Eval Left Eval  Hip flexion 100+ 100+  Hip extension    Hip abduction    Hip adduction    Hip internal rotation    Hip external rotation    Knee flexion 115 115  Knee extension -5 0  Ankle dorsiflexion 7 14  Ankle plantarflexion    Ankle inversion    Ankle eversion     (Blank rows = not tested)  LOWER EXTREMITY MMT:  resisted tests in sitting  MMT Right Eval Left Eval  Hip flexion 4 5  Hip extension    Hip abduction 4 4  Hip adduction    Hip internal rotation    Hip external rotation    Knee flexion 4 5  Knee extension 4 5  Ankle dorsiflexion 3+ 4  Ankle plantarflexion 2+ 3-  Ankle inversion    Ankle eversion    (Blank rows = not tested)  Trunk flexion: 2+/5 Trunk extension: NT Single leg stance LLE: 10+ sec; RLE: 5-8 sec  BED MOBILITY:  indep  TRANSFERS: Sit-stand and chair-chair independent Floor to chair/stand: DNT   CURB:  Findings: modified indep  STAIRS: Findings: Comments: modified indep step-to pattern GAIT: Findings: Distance walked:   and Comments: mildly antalgic RLE Independent-mod I level surfaces; CGA uneven FUNCTIONAL TESTS:  5 times sit to stand: 27 sec 2 minute walk test:  10 meter walk test: 15.4 sec = 2.12 ft/sec  11/01/23:  5x  STS= 18.56 sec  M-CTSIB  Condition 1: Firm Surface, EO 30 Sec, Normal Sway  Condition 2: Firm Surface, EC 30 Sec, Normal Sway  Condition 3: Foam Surface, EO  Sec,  Sway  Condition 4: Foam Surface, EC  Sec,  Sway    Dynamic Gait Index: Dynamic Gait Index  Mark the lowest level that applies.   Date Performed 6/24  Gait level surface (2) Mild Impairment: Walks 20', uses AD, slower speed, mild gait deviations  2. Change in gait speed (3) Normal: Able to smoothly change walking speed without loss of balance or gait deviation. Shows a significant difference in walking speeds between normal, fast and slow speeds  3. Gait with horizontal head turns (2) Mild Impairment: Performs head turns smoothly with slight change in gait velocity, i.e., minor disruption to smooth gait path or uses walking aid  4. Gait with vertical head turns (2) Mild Impairment: Performs head turns smoothly with slight change in gait velocity, i.e., minor disruption to smooth gait path or uses walking aid  5. Gait and pivot turn (2) Mild Impairment: Pivot turns safely in > 3 seconds and stops with no loss of balance  6. Step over obstacle (2) Mild Impairment: Is able to step over box, but must slow down and adjust steps to clear box safely  7. Step around obstacle (3) Normal: Is able to walk around cones safely without changing gait speed; no evidence of imbalance  8. Steps (1) Moderate Impairment: Two feet to a stair, must use rail  Total score 17/24    Score Interpretation: Score of <19 indicates high risk of falls.  Minimally Clinically Important Difference (MCID):  =DGI scores of<21/24 = 1.80 points DGI scores of >21/24 = 0.60 points   [place] T, Inbar-Borovsky N, Brozgol M, Giladi N, [place] JM. The Dynamic Gait Index in healthy older adults: the role of stair climbing, fear of falling and gender. Gait Posture. 2009 Feb;29(2):237-41. doi: 10.1016/j.gaitpost.2008.08.013. Epub 2008 Oct 8. PMID: 81154560; PMCID:  EFR7290501.  Pardasaney, MYRTIS LOIS Bonus, GEANNIE POUR., et al. (2012). Sensitivity to change and responsiveness of four balance measures for community-dwelling older adults. Physical therapy 92(3): 388-397.                                                                                                                                    TREATMENT DATE: 11/01/23  [facility] Adult PT Treatment:                                                DATE: 11/08/23 Pt seen for aquatic therapy today.  Treatment took place in water  3.5-4.75 ft in depth at the [facility] pool. Temp of water  was 91.  Pt entered/exited the pool via stairs independently with bil rail. Exercises: Exercises - Side Stepping with Hand Floats  - Braided Sidestepping  - Walking March   - Squat  2 x 10 reps - Backward Lunge with Forward Reach with Kickboard   - Upright Side Lunge  - Standing 'L' Stretch at [employer] - Single Leg Stance   - Seated Straddle on Flotation Forward Breast Stroke Arms and Bicycle Legs  Pt requires the buoyancy and hydrostatic pressure of water  for support, and to offload joints by unweighting joint load by at least 50 % in navel deep water  and by at least 75-80% in chest to neck deep water .  Viscosity of the water  is needed for resistance of strengthening. Water  current perturbations provides challenge to standing balance requiring increased core activation. Exercises able to be performed safely in water  without the risk of fall compared to those same exercises performed on land  PATIENT EDUCATION: Education details: Aquatic rationale and goals of interventions today,  Person educated: Patient Education method: Explanation  Education comprehension: verbalized understanding  HOME EXERCISE PROGRAM: Access Code: [facility] URL: [email or web address] Date: 09/13/2023 Prepared by: Burnard Sandifer  Exercises - Supine Posterior Pelvic Tilt  - 2-3 x weekly - 3 sets - 10 reps - Hooklying Isometric  Hip Flexion  - 1 x daily - 2-3 x weekly - 3 sets - 10 reps - Supine Quad Set  - 1 x daily - 2-3 x weekly - 3 sets - 10 reps  Previous Aquatic Access Code: [email or web address] URL: [email or web address] (  issued 02/2022) Re-issued 11/08/23   Exercises - Side Stepping with Hand Floats  - 1 x daily - 7 x weekly - 10 reps - Braided Sidestepping  - 1 x daily - 7 x weekly - 10 reps - Walking March  - 1 x daily - 7 x weekly - 10 reps - Squat  - 1 x daily - 7 x weekly - 3 sets - 10 reps - Backward Lunge with Forward Reach with Kickboard  - 1 x daily - 3 x weekly - 1-2 sets - 10 reps - Upright Side Lunge  - 1 x daily - 3 x weekly - 1-2 sets - 10 reps - Standing 'L' Stretch at Counter  - 1 x daily - 7 x weekly - 2 reps - 20 seconds  hold - Single Leg Stance  - 1 x daily - 7 x weekly - 1 sets - 2-3 reps - 15-30 seconds  hold - Seated Straddle on Flotation Forward Breast Stroke Arms and Bicycle Legs  - 1 x daily - 7 x weekly - 3 sets - 10 reps  GOALS: Goals reviewed with patient? Yes  SHORT TERM GOALS: Target date: 10/11/2023    Patient will be independent in HEP to improve functional outcomes Baseline: Goal status: MET 10/25/2023 (Met in aquatics 11/08/23)  2.  Demo improved BLE strength and balance per time < 20 sec 5xSTS test Baseline: see above Goal status:  MET - 11/01/23  3.  Independent ambulation uneven ground to return to gardening activities Baseline: CGA; per report-not using cane-hasn't gone in the yard 10/25/2023 (could not assess due to rain today) Goal status:  DEFERRED, pt reports they have moved gardening area and she does not want to work on this 10/25/2023    LONG TERM GOALS: Target date: 11/08/2023>UPDATED TARGET 11/17/2023    Independent w/ advanced HEP to include land and aquatic-based elements to prepare for D/C Baseline:  Goal status:  MET (Met in aquatics 11/08/23)  2.  Modified independent floor/ground to stand to improve mobility and safety with gardening  activities Baseline: NT; independent Goal status:  MET  3.  Demo low risk for falls per score 20/24 Dynamic Gait Index to improve safety with mobility Baseline: 22/24 Goal status:  MET  4.  Improve gait speed to 2.8 ft/sec to improve efficiency community ambulation Baseline: 2.12 no AD>2.83 ft/sec; 3.2 ft/sec Goal status: MET, 10/25/2023  5.  Improve RLE strength to 5/5 for improved single limb support/stability Baseline: 4/5; 5/5 Goal status:  MET  6.  Back/right knee pain not to exceed 3/10 with exercise/functional mobility/activities Baseline: 6/10; back pain: 6/10, right knee: 8/10 Goal status: NOT MET  ASSESSMENT:  CLINICAL IMPRESSION: Pt reports ongoing issues of right knee pain with some improvement from recent injection but still with antalgic gait pattern and ambulation of stairs w/ step-to pattern. Demo low risk for falls per outcome measures 22/24 Dynamic Gait Index with only limitations being decreased speed for stepping over obstacles and modified stair ambulation.  Independent performance for floor to stand without need for external support.  Good recall to land and aquatic based HEP activities.  Pt to D/C to HEP at this time and reports she is to f/u w/ orthopedics regarding ongoing issues of joint pain.    OBJECTIVE IMPAIRMENTS: Abnormal gait, decreased activity tolerance, decreased balance, decreased mobility, difficulty walking, decreased strength, improper body mechanics, and pain.   ACTIVITY LIMITATIONS: carrying, lifting, bending, stairs, transfers, and locomotion level  PARTICIPATION LIMITATIONS: meal prep,  cleaning, laundry, community activity, occupation, and yard work  PERSONAL FACTORS: Age, Time since onset of injury/illness/exacerbation, and 1-2 comorbidities: PMH are also affecting patient's functional outcome.   REHAB POTENTIAL: Excellent  CLINICAL DECISION MAKING: Evolving/moderate complexity  EVALUATION COMPLEXITY: Moderate  PLAN:  PT FREQUENCY:  1-2x/week  PT DURATION: 4 weeks  PLANNED INTERVENTIONS: 97750- Physical Performance Testing, 97110-Therapeutic exercises, 97530- Therapeutic activity, [id number]- Neuromuscular re-education, 97535- Self Care, 02859- Manual therapy, [id number]- Gait training, [id number]- Aquatic Therapy, [phone]- Electrical stimulation (unattended), and 20560 (1-2 muscles), 20561 (3+ muscles)- Dry Needling  PLAN FOR NEXT SESSION: D/C to HEP  10:54 AM, 11/17/23 M. Kelly Jaliza Seifried, PT, DPT Physical Therapist- [place] Office Number: [phone]

## 2023-11-24 ENCOUNTER — Ambulatory Visit

## 2023-11-24 DIAGNOSIS — R002 Palpitations: Secondary | ICD-10-CM

## 2023-11-25 LAB — CUP PACEART REMOTE DEVICE CHECK
Date Time Interrogation Session: 20250904223747
Implantable Pulse Generator Implant Date: 20250603

## 2023-11-28 ENCOUNTER — Encounter: Payer: Self-pay | Admitting: Physical Medicine and Rehabilitation

## 2023-11-29 ENCOUNTER — Encounter: Payer: Self-pay | Admitting: Family Medicine

## 2023-12-01 ENCOUNTER — Encounter: Payer: Self-pay | Admitting: Family Medicine

## 2023-12-03 NOTE — Progress Notes (Signed)
 Remote Loop Recorder Transmission

## 2023-12-07 ENCOUNTER — Ambulatory Visit: Payer: Self-pay | Admitting: Cardiovascular Disease

## 2023-12-15 ENCOUNTER — Encounter: Payer: Self-pay | Admitting: Registered Nurse

## 2023-12-15 ENCOUNTER — Encounter: Attending: Registered Nurse | Admitting: Registered Nurse

## 2023-12-15 VITALS — BP 106/67 | HR 64 | Ht 62.0 in | Wt 203.4 lb

## 2023-12-15 DIAGNOSIS — M797 Fibromyalgia: Secondary | ICD-10-CM | POA: Insufficient documentation

## 2023-12-15 DIAGNOSIS — M5416 Radiculopathy, lumbar region: Secondary | ICD-10-CM | POA: Insufficient documentation

## 2023-12-15 DIAGNOSIS — Z79891 Long term (current) use of opiate analgesic: Secondary | ICD-10-CM | POA: Diagnosis present

## 2023-12-15 DIAGNOSIS — M25511 Pain in right shoulder: Secondary | ICD-10-CM | POA: Insufficient documentation

## 2023-12-15 DIAGNOSIS — G894 Chronic pain syndrome: Secondary | ICD-10-CM | POA: Insufficient documentation

## 2023-12-15 DIAGNOSIS — G8929 Other chronic pain: Secondary | ICD-10-CM | POA: Diagnosis present

## 2023-12-15 DIAGNOSIS — Z5181 Encounter for therapeutic drug level monitoring: Secondary | ICD-10-CM | POA: Diagnosis present

## 2023-12-15 DIAGNOSIS — M47817 Spondylosis without myelopathy or radiculopathy, lumbosacral region: Secondary | ICD-10-CM | POA: Diagnosis not present

## 2023-12-15 DIAGNOSIS — M2550 Pain in unspecified joint: Secondary | ICD-10-CM | POA: Diagnosis present

## 2023-12-15 MED ORDER — BUPRENORPHINE 5 MCG/HR TD PTWK: 1.0000 | MEDICATED_PATCH | TRANSDERMAL | 0 refills | Status: DC

## 2023-12-15 NOTE — Progress Notes (Unsigned)
 Subjective:    Patient ID: Amy Hooper, female    DOB: 1951/03/08, 73 y.o.   MRN: [id number]  HPI: Amy Hooper is a 73 y.o. female who returns for follow up appointment for chronic pain and medication refill. states *** pain is located in  ***. rates pain ***. current exercise regime is walking and performing stretching exercises.  Amy Hooper Morphine  equivalent is  22.50 MME.   Last UDS was Performed on 10/14/2023, it was consistent    Pain Inventory Average Pain 8 Pain Right Now 5 My pain is burning, dull, and tingling  In the last 24 hours, has pain interfered with the following? General activity 8 Relation with others 8 Enjoyment of life 8 What TIME of day is your pain at its worst? morning , daytime, and evening Sleep (in general) Fair  Pain is worse with: walking, bending, sitting, inactivity, and standing Pain improves with: rest, heat/ice, and pacing activities Relief from Meds: 8  Family History  Problem Relation Age of Onset   Hypothyroidism Mother    Hyperlipidemia Mother    Miscarriages / [place] Mother    Hyperlipidemia Other    Kidney failure Father    Dementia Father    Gout Father    Arthritis Father    Prostate cancer Father    Heart disease Father    Hypertension Father    Kidney disease Father    Stroke Father    Social History   Socioeconomic History   Marital status: Married    Spouse name: Not on file   Number of children: 3   Years of education: Not on file   Highest education level: Bachelor's degree (e.g., BA, AB, BS)  Occupational History   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     Social Drivers of Corporate investment banker Strain: Low Risk  (07/25/2023)   Overall Financial Resource Strain (CARDIA)    Difficulty of Paying Living Expenses: Not hard at all  Food Insecurity: No Food Insecurity (08/18/2023)   Hunger Vital Sign    Worried About Running Out of Food in the Last Year: Never true    Ran Out of Food in the Last Year: Never true  Transportation Needs: No Transportation Needs (08/18/2023)   PRAPARE - Administrator, Civil Service (Medical): No    Lack of Transportation (Non-Medical): No  Physical Activity: Inactive (07/25/2023)   Exercise Vital Sign    Days of Exercise per Week: 0 days    Minutes of Exercise per Session: 20 min  Stress: Stress Concern Present (07/25/2023)   [employer] of Occupational Health - Occupational Stress Questionnaire    Feeling of Stress : Rather much  Social Connections: Moderately Integrated (08/18/2023)   Social Connection and Isolation Panel    Frequency of Communication with Friends and Family: Three times a week    Frequency of Social Gatherings with Friends and Family: Twice a week  Attends Religious Services: Never    Active Member of Clubs or Organizations: Yes    Attends Banker Meetings: Never    Marital Status: Married  Recent Concern: Social Connections - Moderately Isolated (07/25/2023)   Social Connection and Isolation Panel    Frequency of Communication with Friends and Family: Three times a week    Frequency of Social Gatherings with Friends and Family: Once a week    Attends Religious Services: Never    Database administrator or Organizations: No    Attends Banker Meetings: Never    Marital Status: Married   Past Surgical History:  Procedure Laterality Date   APPLICATION OF WOUND VAC N/A 03/25/2021   Procedure: APPLICATION OF WOUND VAC;   Surgeon: Barbarann Oneil BROCKS, MD;  [facility];  Service: Orthopedics;  Laterality: N/A;   APPLICATION OF WOUND VAC N/A 03/30/2021   Procedure: WOUND VAC CHANGE 12x6x5;  Surgeon: Barbarann Oneil BROCKS, MD;  Location: [facility];  Service: Orthopedics;  Laterality: N/A;   INCISION AND DRAINAGE OF WOUND N/A 03/25/2021   Procedure: LUMBAR POST OP INCISION IRRIGATION;  Surgeon: Barbarann Oneil BROCKS, MD;  Location: [facility];  Service: Orthopedics;  Laterality: N/A;   JOINT REPLACEMENT     KNEE ARTHROSCOPY     LOOP RECORDER INSERTION N/A 08/23/2023   Procedure: LOOP RECORDER INSERTION;  Surgeon: Nancey Eulas BRAVO, MD;  [facility];  Service: Cardiovascular;  Laterality: N/A;   LUMBAR WOUND DEBRIDEMENT N/A 03/30/2021   Procedure: REPEAT LUMBAR WOUND DEBRIDEMENT;  Surgeon: Barbarann Oneil BROCKS, MD;  [facility]: [facility];  Service: Orthopedics;  Laterality: N/A;   right rotator cuff     ROTATOR CUFF REPAIR Left    TOTAL KNEE ARTHROPLASTY Left 09/08/2020   Procedure: LEFT TOTAL KNEE ARTHROPLASTY;  Surgeon: Jerri Kay HERO, MD;  [facility]: [facility];  Service: Orthopedics;  Laterality: Left;   TUBAL LIGATION     Past Surgical History:  Procedure Laterality Date   APPLICATION OF WOUND VAC N/A 03/25/2021   Procedure: APPLICATION OF WOUND VAC;  Surgeon: Barbarann Oneil BROCKS, MD;  [facility];  Service: Orthopedics;  Laterality: N/A;   APPLICATION OF WOUND VAC N/A 03/30/2021   Procedure: WOUND VAC CHANGE 12x6x5;  Surgeon: Barbarann Oneil BROCKS, MD;  [facility];  Service: Orthopedics;  Laterality: N/A;   INCISION AND DRAINAGE OF WOUND N/A 03/25/2021   Procedure: LUMBAR POST OP INCISION IRRIGATION;  Surgeon: Barbarann Oneil BROCKS, MD;  [facility];  Service: Orthopedics;  Laterality: N/A;   JOINT REPLACEMENT     KNEE ARTHROSCOPY     LOOP RECORDER INSERTION N/A 08/23/2023   Procedure: LOOP RECORDER INSERTION;  Surgeon: Nancey Eulas BRAVO, MD;  [facility];  Service: Cardiovascular;  Laterality: N/A;   LUMBAR WOUND DEBRIDEMENT N/A 03/30/2021    Procedure: REPEAT LUMBAR WOUND DEBRIDEMENT;  Surgeon: Barbarann Oneil BROCKS, MD;  [facility];  Service: Orthopedics;  Laterality: N/A;   right rotator cuff     ROTATOR CUFF REPAIR Left    TOTAL KNEE ARTHROPLASTY Left 09/08/2020   Procedure: LEFT TOTAL KNEE ARTHROPLASTY;  Surgeon: Jerri Kay HERO, MD;  [facility]: [facility];  Service: Orthopedics;  Laterality: Left;   TUBAL LIGATION     Past Medical History:  Diagnosis Date   Allergy 1976   Tramadol  NSAIDS   Chronic kidney disease    CVA (cerebrovascular accident) (HCC) 08/18/2023   Depression    Fibromyalgia  Gout    HTN (hypertension)    Hyperlipidemia 09/01/22   Insomnia    Morbid obesity (HCC)    Morbid obesity (HCC)    Nocturnal leg cramps 07/27/2023   Non-recurrent acute suppurative otitis media of left ear without spontaneous rupture of tympanic membrane 04/30/2022   Osteoarthritis    Palpitation    Palpitations 04/22/2014   Rash and nonspecific skin eruption 07/23/2022   Sleep apnea    Stroke (HCC) 08/23/2023   BP 106/67 (BP Location: Right Arm, Patient Position: Sitting, Cuff Size: Large) Comment (BP Location): patient preference  Pulse 64   Ht 5' 2 (1.575 m)   Wt 203 lb 6.4 oz (92.3 kg)   SpO2 98%   BMI 37.20 kg/m   Opioid Risk Score:   Fall Risk Score:  `1  Depression screen [facility] 2/9     12/15/2023   11:05 AM 10/24/2023   10:21 AM 10/14/2023    1:08 PM 09/19/2023    3:44 PM 06/20/2023    2:43 PM 04/25/2023    8:16 AM 01/12/2023    8:21 AM  Depression screen PHQ 2/9  Decreased Interest 0 0 1 0 0 1 0  Down, Depressed, Hopeless 0 0 1 0 0 3 0  PHQ - 2 Score 0 0 2 0 0 4 0  Altered sleeping      3   Tired, decreased energy      3   Change in appetite      1   Feeling bad or failure about yourself       1   Trouble concentrating      1   Moving slowly or fidgety/restless      0   Suicidal thoughts      0   PHQ-9 Score      13   Difficult doing work/chores      Somewhat difficult       Review of Systems   Musculoskeletal:  Positive for arthralgias, back pain and myalgias.       Low back pain, right shoulder pain, right leg and foot pain  All other systems reviewed and are negative.      Objective:   Physical Exam        Assessment & Plan:

## 2023-12-19 NOTE — Progress Notes (Signed)
 Remote Loop Recorder Transmission

## 2023-12-19 NOTE — Addendum Note (Signed)
 Addended by: VICCI SELLER A on: 12/19/2023 03:15 PM   Modules accepted: Orders

## 2023-12-21 ENCOUNTER — Other Ambulatory Visit: Payer: Self-pay | Admitting: Nurse Practitioner

## 2023-12-21 DIAGNOSIS — G4709 Other insomnia: Secondary | ICD-10-CM

## 2023-12-21 NOTE — Telephone Encounter (Signed)
 Last apt 11/27/23

## 2023-12-22 ENCOUNTER — Encounter: Attending: Registered Nurse | Admitting: Physical Medicine & Rehabilitation

## 2023-12-22 ENCOUNTER — Telehealth: Payer: Self-pay

## 2023-12-22 ENCOUNTER — Encounter: Payer: Self-pay | Admitting: Physical Medicine & Rehabilitation

## 2023-12-22 ENCOUNTER — Telehealth: Payer: Self-pay | Admitting: Registered Nurse

## 2023-12-22 ENCOUNTER — Other Ambulatory Visit: Payer: Self-pay | Admitting: Nurse Practitioner

## 2023-12-22 VITALS — BP 153/83 | HR 62 | Ht 62.0 in | Wt 203.0 lb

## 2023-12-22 DIAGNOSIS — M961 Postlaminectomy syndrome, not elsewhere classified: Secondary | ICD-10-CM | POA: Diagnosis not present

## 2023-12-22 DIAGNOSIS — M25561 Pain in right knee: Secondary | ICD-10-CM | POA: Diagnosis present

## 2023-12-22 DIAGNOSIS — M25512 Pain in left shoulder: Secondary | ICD-10-CM | POA: Diagnosis present

## 2023-12-22 DIAGNOSIS — M2550 Pain in unspecified joint: Secondary | ICD-10-CM | POA: Diagnosis present

## 2023-12-22 DIAGNOSIS — M797 Fibromyalgia: Secondary | ICD-10-CM | POA: Insufficient documentation

## 2023-12-22 DIAGNOSIS — M47817 Spondylosis without myelopathy or radiculopathy, lumbosacral region: Secondary | ICD-10-CM | POA: Insufficient documentation

## 2023-12-22 DIAGNOSIS — M5416 Radiculopathy, lumbar region: Secondary | ICD-10-CM | POA: Insufficient documentation

## 2023-12-22 DIAGNOSIS — M25511 Pain in right shoulder: Secondary | ICD-10-CM | POA: Insufficient documentation

## 2023-12-22 DIAGNOSIS — G8929 Other chronic pain: Secondary | ICD-10-CM | POA: Insufficient documentation

## 2023-12-22 NOTE — Patient Instructions (Signed)
 Will do a lumbar medial branch blocks which will block the nerves to the arthritic facet joints.  Will do above the fusion.  We are looking for an 80% pain relief on 2 occasions before proceeding to the radiofrequency neurotomy procedure.

## 2023-12-22 NOTE — Progress Notes (Signed)
 Subjective:    Patient ID: Amy Hooper, female    DOB: 11-30-1950, 73 y.o.   MRN: [id number]  HPI Chief complaint: Right sided low back and lateral thigh pain 73 year old female advised to see me by the midlevel practitioner at this clinic to evaluate interventional pain procedures for her lumbar spine issues.  Patient has have a history of L4-5 posterior lumbar fusion performed at [facility].  She had a televisit with her neurosurgeon about 1 month ago.  He suggested that she may be having some sciatic symptoms on the right side The patient had an MRI lumbar spine dated 09/28/2023 at [facility] radiology which I reviewed with the patient.  I looked at the actual images as well as the report.  MRI LUMBAR SPINE WITHOUT CONTRAST   TECHNIQUE: Multiplanar, multisequence MR imaging of the lumbar spine was performed. No intravenous contrast was administered.   COMPARISON:  MRI of the lumbar spine dated 09/30/2022   FINDINGS: Segmentation: Standard.   Alignment:  Physiologic lumbar alignment is maintained.   Vertebrae: Operative changes of posterior decompression with anterior and posterior fusion at L4-L5. Degenerative endplate marrow changes at a few levels. No compression fractures.   Conus medullaris and cauda equina: The conus medullaris terminates at the level of L1-L2. The distal spinal cord signal intensity is normal.   Paraspinal and other soft tissues: Left renal cyst. The visualized aorta is normal.   Disc levels:   L1-L2: Disc is normal in configuration. Moderate bilateral facet arthropathy. No neuroforaminal stenosis. No spinal canal stenosis.   L2-L3: Disc is normal in configuration. Moderate bilateral facet arthropathy. No neuroforaminal stenosis. No spinal canal stenosis.   L3-L4: Disc bulge. Severe bilateral facet arthropathy. Moderate bilateral neuroforaminal stenosis. Moderate spinal canal stenosis.   L4-L5: Interbody fusion.  Severe bilateral. No neuroforaminal stenosis. No spinal canal stenosis.   L5-S1: Mild disc bulge and small right subarticular protrusion. Severe bilateral facet arthropathy. Mild right neuroforaminal stenosis. Mild spinal canal stenosis.   IMPRESSION: 1. Operative changes of posterior decompression with anterior and posterior fusion at L4-L5. 2. Moderate canal and foraminal stenoses at L3-L4 secondary to disc bulging and facet arthropathy. 3. Mild canal and right foraminal stenosis at L5-S1 secondary to disc bulge/right subarticular protrusion and facet arthropathy.     Electronically Signed   By: Clem Savory M.D.   On: 09/29/2023 10:53   Pain Inventory Average Pain 8 Pain Right Now 8 My pain is burning, dull, and stabbing  In the last 24 hours, has pain interfered with the following? General activity 8 Relation with others 8 Enjoyment of life 8 What TIME of day is your pain at its worst? morning , daytime, and evening Sleep (in general) Fair  Pain is worse with: walking, bending, sitting, inactivity, standing, and some activites Pain improves with: rest, heat/ice, medication, TENS, and injections Relief from Meds: 7  Family History  Problem Relation Age of Onset   Hypothyroidism Mother    Hyperlipidemia Mother    Miscarriages / [place] Mother    Hyperlipidemia Other    Kidney failure Father    Dementia Father    Gout Father    Arthritis Father    Prostate cancer Father    Heart disease Father    Hypertension Father    Kidney disease Father    Stroke Father    Social History   Socioeconomic History   Marital status: Married    Spouse name: Not on file   Number of  children: 3   Years of education: Not on file   Highest education level: Bachelor's degree (e.g., BA, AB, BS)  Occupational History   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     Social Drivers of Corporate investment banker Strain: Low Risk  (07/25/2023)   Overall Financial Resource Strain (CARDIA)    Difficulty of Paying Living Expenses: Not hard at all  Food Insecurity: No Food Insecurity (08/18/2023)   Hunger Vital Sign    Worried About Running Out of Food in the Last Year: Never true    Ran Out of Food in the Last Year: Never true  Transportation Needs: No Transportation Needs (08/18/2023)   PRAPARE - Administrator, Civil Service (Medical): No    Lack of Transportation (Non-Medical): No  Physical Activity: Inactive (07/25/2023)   Exercise Vital Sign    Days of Exercise per Week: 0 days    Minutes of Exercise per Session: 20 min  Stress: Stress Concern Present (07/25/2023)   [employer] of Occupational Health - Occupational Stress Questionnaire    Feeling of Stress : Rather much  Social Connections: Moderately Integrated (08/18/2023)   Social Connection and Isolation Panel    Frequency of Communication with Friends and Family: Three times a week    Frequency of Social Gatherings with Friends and Family: Twice a week    Attends Religious Services: Never    Database administrator or Organizations: Yes    Attends Banker Meetings: Never    Marital Status: Married  Recent Concern: Social Connections - Moderately Isolated (07/25/2023)   Social Connection and Isolation Panel    Frequency of Communication with Friends and Family: Three times a week    Frequency of Social Gatherings with Friends and Family: Once a week    Attends Religious Services: Never    Database administrator or Organizations: No     Attends Banker Meetings: Never    Marital Status: Married   Past Surgical History:  Procedure Laterality Date   APPLICATION OF WOUND VAC N/A 03/25/2021   Procedure: APPLICATION OF WOUND VAC;  Surgeon: Barbarann Oneil BROCKS, MD;  [facility];  Service: Orthopedics;  Laterality: N/A;   APPLICATION OF WOUND VAC N/A 03/30/2021   Procedure: WOUND VAC CHANGE 12x6x5;  Surgeon: Barbarann Oneil BROCKS, MD;  [facility]: [facility];  Service: Orthopedics;  Laterality: N/A;   INCISION AND DRAINAGE OF WOUND N/A 03/25/2021   Procedure: LUMBAR POST OP INCISION IRRIGATION;  Surgeon: Barbarann Oneil BROCKS, MD;  [facility];  Service: Orthopedics;  Laterality: N/A;   JOINT REPLACEMENT     KNEE ARTHROSCOPY     LOOP RECORDER INSERTION N/A 08/23/2023   Procedure: LOOP RECORDER INSERTION;  Surgeon: Nancey Eulas BRAVO, MD;  [facility];  Service: Cardiovascular;  Laterality: N/A;   LUMBAR WOUND DEBRIDEMENT N/A 03/30/2021   Procedure: REPEAT LUMBAR WOUND DEBRIDEMENT;  Surgeon: Barbarann Oneil  C, MD;  [facility];  Service: Orthopedics;  Laterality: N/A;   right rotator cuff     ROTATOR CUFF REPAIR Left    TOTAL KNEE ARTHROPLASTY Left 09/08/2020   Procedure: LEFT TOTAL KNEE ARTHROPLASTY;  Surgeon: Jerri Kay HERO, MD;  [facility];  Service: Orthopedics;  Laterality: Left;   TUBAL LIGATION     Past Surgical History:  Procedure Laterality Date   APPLICATION OF WOUND VAC N/A 03/25/2021   Procedure: APPLICATION OF WOUND VAC;  Surgeon: Barbarann Oneil BROCKS, MD;  [facility];  Service: Orthopedics;  Laterality: N/A;   APPLICATION OF WOUND VAC N/A 03/30/2021   Procedure: WOUND VAC CHANGE 12x6x5;  Surgeon: Barbarann Oneil BROCKS, MD;  [facility];  Service: Orthopedics;  Laterality: N/A;   INCISION AND DRAINAGE OF WOUND N/A 03/25/2021   Procedure: LUMBAR POST OP INCISION IRRIGATION;  Surgeon: Barbarann Oneil BROCKS, MD;  [facility];  Service: Orthopedics;  Laterality: N/A;   JOINT REPLACEMENT     KNEE ARTHROSCOPY     LOOP RECORDER  INSERTION N/A 08/23/2023   Procedure: LOOP RECORDER INSERTION;  Surgeon: Nancey Eulas BRAVO, MD;  [facility];  Service: Cardiovascular;  Laterality: N/A;   LUMBAR WOUND DEBRIDEMENT N/A 03/30/2021   Procedure: REPEAT LUMBAR WOUND DEBRIDEMENT;  Surgeon: Barbarann Oneil BROCKS, MD;  [facility];  Service: Orthopedics;  Laterality: N/A;   right rotator cuff     ROTATOR CUFF REPAIR Left    TOTAL KNEE ARTHROPLASTY Left 09/08/2020   Procedure: LEFT TOTAL KNEE ARTHROPLASTY;  Surgeon: Jerri Kay HERO, MD;  [facility];  Service: Orthopedics;  Laterality: Left;   TUBAL LIGATION     Past Medical History:  Diagnosis Date   Allergy 1976   Tramadol  NSAIDS   Chronic kidney disease    CVA (cerebrovascular accident) (HCC) 08/18/2023   Depression    Fibromyalgia    Gout    HTN (hypertension)    Hyperlipidemia 09/01/22   Insomnia    Morbid obesity (HCC)    Morbid obesity (HCC)    Nocturnal leg cramps 07/27/2023   Non-recurrent acute suppurative otitis media of left ear without spontaneous rupture of tympanic membrane 04/30/2022   Osteoarthritis    Palpitation    Palpitations 04/22/2014   Rash and nonspecific skin eruption 07/23/2022   Sleep apnea    Stroke (HCC) 08/23/2023   BP (!) 153/83   Pulse 62   Ht 5' 2 (1.575 m)   Wt 203 lb (92.1 kg)   SpO2 97%   BMI 37.13 kg/m   Opioid Risk Score:   Fall Risk Score:  `1  Depression screen [facility] 2/9     12/15/2023   11:05 AM 10/24/2023   10:21 AM 10/14/2023    1:08 PM 09/19/2023    3:44 PM 06/20/2023    2:43 PM 04/25/2023    8:16 AM 01/12/2023    8:21 AM  Depression screen PHQ 2/9  Decreased Interest 0 0 1 0 0 1 0  Down, Depressed, Hopeless 0 0 1 0 0 3 0  PHQ - 2 Score 0 0 2 0 0 4 0  Altered sleeping      3   Tired, decreased energy      3   Change in appetite      1   Feeling bad or failure about yourself       1   Trouble concentrating      1   Moving slowly  or fidgety/restless      0   Suicidal thoughts      0   PHQ-9 Score       13   Difficult doing work/chores      Somewhat difficult     Review of Systems  Musculoskeletal:  Positive for back pain.       Pain in outer upper right leg       Objective:   Physical Exam Obese female no acute distress Mood and affect appropriate Lumbar spine with lordosis Healed midline incision pending around L4 superiorly going down to S1 inferiorly No evidence of lumbar scoliosis Lumbar range of motion is reduced with extension has less than 25% but she has 75% of normal flexion.  Her symptoms are relieved with flexion and exacerbated by extension. Negative straight leg raise Sensation is intact light touch bilateral L2 L3-L4-L5 distribution Motor strength is 5/5 bilateral hip flexion knee extension ankle dorsiflexion Hips have no pain with external rotation but do have some groin pain with internal rotation bilaterally   Sacral thrust (prone) : Positive Lateral compression: Negative at the SI positive left greater trochanter FABER's: Negative Distraction (supine): Negative Thigh thrust test: Negative       Assessment & Plan:  1.  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  She does have low back pain likely due to lumbar spondylosis above the fusion.  For this I would recommend L1-L2-L3 medial branch blocks to further evaluate 2.  Patient with right sided thigh pain.  She does have some tenderness over the greater trochanter bilaterally but no left lateral thigh pain.  She does have some stenosis bilaterally at L3-4 graded as moderate.  She may benefit from transforaminal lumbar epidural steroid injection on the right side and we will set her up for this.  She has failed physical therapy as well as other conservative treatments including medication management including Lyrica  and Butrans  and cyclobenzaprine  and duloxetine . Preprocedure instructions given.  She has no contrast allergy She has no anticoagulant use other than baby aspirin  which  she cannot continue taking

## 2023-12-22 NOTE — Telephone Encounter (Signed)
(  Key: [id number]) PA Case ID #: [id number] Need Help? Call us  at [phone] Status sent iconSent to Plan today Drug Buprenorphine  5MCG/HR weekly patches ePA cloud logo Form [facility] Electronic Prior Authorization Form [phone] NCPDP)

## 2023-12-22 NOTE — Telephone Encounter (Signed)
 Call placed to Ms. [name], her Butrans  was approved and her prescription was sent to [facility]. She verbalizes understanding.

## 2023-12-22 NOTE — Telephone Encounter (Signed)
 Patient calling into office to report that the rx for pain medication was sent to [facility] and it needs to be sent to [facility] in [place], [place]

## 2023-12-22 NOTE — Telephone Encounter (Signed)
 Outcome Approved today by [facility] 2017 NCPDP Request Reference Number: [id number]. BUPRENORPHIN DIS 5MCG/HR is approved through 01/21/2024. Your patient may now fill this prescription and it will be covered. Effective Date: 12/22/2023 Authorization Expiration Date: 01/21/2024

## 2023-12-23 ENCOUNTER — Other Ambulatory Visit: Payer: Self-pay | Admitting: Nurse Practitioner

## 2023-12-25 ENCOUNTER — Ambulatory Visit

## 2023-12-25 DIAGNOSIS — R002 Palpitations: Secondary | ICD-10-CM

## 2023-12-27 LAB — CUP PACEART REMOTE DEVICE CHECK
Date Time Interrogation Session: 20251004233107
Implantable Pulse Generator Implant Date: 20250603

## 2023-12-27 NOTE — Progress Notes (Signed)
 Remote Loop Recorder Transmission

## 2024-01-02 ENCOUNTER — Ambulatory Visit: Payer: Self-pay | Admitting: Cardiovascular Disease

## 2024-01-03 ENCOUNTER — Encounter: Payer: Self-pay | Admitting: Nurse Practitioner

## 2024-01-03 DIAGNOSIS — M109 Gout, unspecified: Secondary | ICD-10-CM

## 2024-01-03 MED ORDER — PREDNISONE 20 MG PO TABS: 40.0000 mg | ORAL_TABLET | Freq: Every day | ORAL | 0 refills | Status: DC

## 2024-01-06 ENCOUNTER — Encounter (HOSPITAL_BASED_OUTPATIENT_CLINIC_OR_DEPARTMENT_OTHER): Admitting: Registered Nurse

## 2024-01-06 ENCOUNTER — Encounter: Payer: Self-pay | Admitting: Registered Nurse

## 2024-01-06 VITALS — BP 131/81 | HR 60 | Ht 62.0 in | Wt 207.2 lb

## 2024-01-06 DIAGNOSIS — M961 Postlaminectomy syndrome, not elsewhere classified: Secondary | ICD-10-CM

## 2024-01-06 DIAGNOSIS — G8929 Other chronic pain: Secondary | ICD-10-CM

## 2024-01-06 DIAGNOSIS — M2550 Pain in unspecified joint: Secondary | ICD-10-CM | POA: Diagnosis not present

## 2024-01-06 DIAGNOSIS — M25511 Pain in right shoulder: Secondary | ICD-10-CM

## 2024-01-06 DIAGNOSIS — M25561 Pain in right knee: Secondary | ICD-10-CM

## 2024-01-06 DIAGNOSIS — M47817 Spondylosis without myelopathy or radiculopathy, lumbosacral region: Secondary | ICD-10-CM

## 2024-01-06 DIAGNOSIS — M797 Fibromyalgia: Secondary | ICD-10-CM

## 2024-01-06 DIAGNOSIS — M25512 Pain in left shoulder: Secondary | ICD-10-CM

## 2024-01-06 DIAGNOSIS — M5416 Radiculopathy, lumbar region: Secondary | ICD-10-CM | POA: Diagnosis not present

## 2024-01-06 MED ORDER — BUPRENORPHINE 7.5 MCG/HR TD PTWK: 1.0000 | MEDICATED_PATCH | TRANSDERMAL | 0 refills | Status: DC

## 2024-01-06 NOTE — Progress Notes (Unsigned)
 Subjective:    Patient ID: Amy Hooper, female    DOB: 04-Dec-1950, 73 y.o.   MRN: [id number]  HPI: Amy Hooper is a 73 y.o. female who returns for follow up appointment for chronic pain and medication refill. states *** pain is located in  ***. rates pain ***. current exercise regime is walking and performing stretching exercises.   Pain Inventory Average Pain 8 Pain Right Now 9 My pain is burning, stabbing  In the last 24 hours, has pain interfered with the following? General activity 7 Relation with others 7 Enjoyment of life 7 What TIME of day is your pain at its worst? morning , daytime, evening, and night Sleep (in general) Poor  Pain is worse with: walking, bending, sitting, inactivity, standing, unsure, and some activites Pain improves with: rest Relief from Meds: little  Family History  Problem Relation Age of Onset   Hypothyroidism Mother    Hyperlipidemia Mother    Miscarriages / [place] Mother    Hyperlipidemia Other    Kidney failure Father    Dementia Father    Gout Father    Arthritis Father    Prostate cancer Father    Heart disease Father    Hypertension Father    Kidney disease Father    Stroke Father    Social History   Socioeconomic History   Marital status: Married    Spouse name: Not on file   Number of children: 3   Years of education: Not on file   Highest education level: Bachelor's degree (e.g., BA, AB, BS)  Occupational History   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      Social Drivers of Corporate investment banker Strain: Low Risk  (07/25/2023)   Overall Financial Resource Strain (CARDIA)    Difficulty of Paying Living Expenses: Not hard at all  Food Insecurity: No Food Insecurity (08/18/2023)   Hunger Vital Sign    Worried About Running Out of Food in the Last Year: Never true    Ran Out of Food in the Last Year: Never true  Transportation Needs: No Transportation Needs (08/18/2023)   PRAPARE - Administrator, Civil Service (Medical): No    Lack of Transportation (Non-Medical): No  Physical Activity: Inactive (07/25/2023)   Exercise Vital Sign    Days of Exercise per Week: 0 days    Minutes of Exercise per Session: 20 min  Stress: Stress Concern Present (07/25/2023)   [employer] of Occupational Health - Occupational Stress Questionnaire    Feeling of Stress : Rather much  Social Connections: Moderately Integrated (08/18/2023)   Social Connection and Isolation Panel    Frequency of Communication with Friends and Family: Three times a week    Frequency of Social Gatherings with Friends and Family: Twice a week    Attends Religious Services: Never    Database administrator or Organizations: Yes    Attends Banker  Meetings: Never    Marital Status: Married  Recent Concern: Social Connections - Moderately Isolated (07/25/2023)   Social Connection and Isolation Panel    Frequency of Communication with Friends and Family: Three times a week    Frequency of Social Gatherings with Friends and Family: Once a week    Attends Religious Services: Never    Database administrator or Organizations: No    Attends Banker Meetings: Never    Marital Status: Married   Past Surgical History:  Procedure Laterality Date   APPLICATION OF WOUND VAC N/A 03/25/2021   Procedure: APPLICATION OF WOUND VAC;  Surgeon: Barbarann Oneil BROCKS, MD;  [facility];  Service: Orthopedics;  Laterality: N/A;   APPLICATION OF WOUND VAC N/A 03/30/2021    Procedure: WOUND VAC CHANGE 12x6x5;  Surgeon: Barbarann Oneil BROCKS, MD;  [facility];  Service: Orthopedics;  Laterality: N/A;   INCISION AND DRAINAGE OF WOUND N/A 03/25/2021   Procedure: LUMBAR POST OP INCISION IRRIGATION;  Surgeon: Barbarann Oneil BROCKS, MD;  [facility];  Service: Orthopedics;  Laterality: N/A;   JOINT REPLACEMENT     KNEE ARTHROSCOPY     LOOP RECORDER INSERTION N/A 08/23/2023   Procedure: LOOP RECORDER INSERTION;  Surgeon: Nancey Eulas BRAVO, MD;  [facility];  Service: Cardiovascular;  Laterality: N/A;   LUMBAR WOUND DEBRIDEMENT N/A 03/30/2021   Procedure: REPEAT LUMBAR WOUND DEBRIDEMENT;  Surgeon: Barbarann Oneil BROCKS, MD;  [facility];  Service: Orthopedics;  Laterality: N/A;   right rotator cuff     ROTATOR CUFF REPAIR Left    TOTAL KNEE ARTHROPLASTY Left 09/08/2020   Procedure: LEFT TOTAL KNEE ARTHROPLASTY;  Surgeon: Jerri Kay HERO, MD;  [facility];  Service: Orthopedics;  Laterality: Left;   TUBAL LIGATION     Past Surgical History:  Procedure Laterality Date   APPLICATION OF WOUND VAC N/A 03/25/2021   Procedure: APPLICATION OF WOUND VAC;  Surgeon: Barbarann Oneil BROCKS, MD;  [facility];  Service: Orthopedics;  Laterality: N/A;   APPLICATION OF WOUND VAC N/A 03/30/2021   Procedure: WOUND VAC CHANGE 12x6x5;  Surgeon: Barbarann Oneil BROCKS, MD;  [facility];  Service: Orthopedics;  Laterality: N/A;   INCISION AND DRAINAGE OF WOUND N/A 03/25/2021   Procedure: LUMBAR POST OP INCISION IRRIGATION;  Surgeon: Barbarann Oneil BROCKS, MD;  [facility];  Service: Orthopedics;  Laterality: N/A;   JOINT REPLACEMENT     KNEE ARTHROSCOPY     LOOP RECORDER INSERTION N/A 08/23/2023   Procedure: LOOP RECORDER INSERTION;  Surgeon: Nancey Eulas BRAVO, MD;  [facility];  Service: Cardiovascular;  Laterality: N/A;   LUMBAR WOUND DEBRIDEMENT N/A 03/30/2021   Procedure: REPEAT LUMBAR WOUND DEBRIDEMENT;  Surgeon: Barbarann Oneil BROCKS, MD;  [facility];  Service: Orthopedics;  Laterality:  N/A;   right rotator cuff     ROTATOR CUFF REPAIR Left    TOTAL KNEE ARTHROPLASTY Left 09/08/2020   Procedure: LEFT TOTAL KNEE ARTHROPLASTY;  Surgeon: Jerri Kay HERO, MD;  [facility];  Service: Orthopedics;  Laterality: Left;   TUBAL LIGATION     Past Medical History:  Diagnosis Date   Allergy 1976   Tramadol  NSAIDS   Chronic kidney disease    CVA (cerebrovascular accident) (HCC) 08/18/2023   Depression    Fibromyalgia    Gout    HTN (hypertension)    Hyperlipidemia 09/01/22   Insomnia    Morbid obesity (HCC)  Morbid obesity (HCC)    Nocturnal leg cramps 07/27/2023   Non-recurrent acute suppurative otitis media of left ear without spontaneous rupture of tympanic membrane 04/30/2022   Osteoarthritis    Palpitation    Palpitations 04/22/2014   Rash and nonspecific skin eruption 07/23/2022   Sleep apnea    Stroke (HCC) 08/23/2023   BP 131/81 (BP Location: Left Arm, Patient Position: Sitting, Cuff Size: Large)   Pulse (!) 58   Ht 5' 2 (1.575 m)   Wt 207 lb 3.2 oz (94 kg)   SpO2 98%   BMI 37.90 kg/m   Opioid Risk Score:   Fall Risk Score:  `1  Depression screen [facility] 2/9     12/15/2023   11:05 AM 10/24/2023   10:21 AM 10/14/2023    1:08 PM 09/19/2023    3:44 PM 06/20/2023    2:43 PM 04/25/2023    8:16 AM 01/12/2023    8:21 AM  Depression screen PHQ 2/9  Decreased Interest 0 0 1 0 0 1 0  Down, Depressed, Hopeless 0 0 1 0 0 3 0  PHQ - 2 Score 0 0 2 0 0 4 0  Altered sleeping      3   Tired, decreased energy      3   Change in appetite      1   Feeling bad or failure about yourself       1   Trouble concentrating      1   Moving slowly or fidgety/restless      0   Suicidal thoughts      0   PHQ-9 Score      13   Difficult doing work/chores      Somewhat difficult       Review of Systems  Musculoskeletal:  Positive for arthralgias and myalgias.       Bilateral shoulder pain, right thigh pain, right knee pain  All other systems reviewed and are negative.       Objective:   Physical Exam        Assessment & Plan:

## 2024-01-19 MED ORDER — DIAZEPAM 5 MG PO TABS: ORAL_TABLET | ORAL | 0 refills | Status: DC

## 2024-01-23 ENCOUNTER — Ambulatory Visit: Admitting: Physical Medicine and Rehabilitation

## 2024-01-23 ENCOUNTER — Encounter: Payer: Self-pay | Admitting: Orthopaedic Surgery

## 2024-01-23 ENCOUNTER — Encounter: Payer: Self-pay | Admitting: Radiology

## 2024-01-24 ENCOUNTER — Encounter: Payer: Self-pay | Admitting: Physical Medicine & Rehabilitation

## 2024-01-24 ENCOUNTER — Encounter: Attending: Registered Nurse | Admitting: Physical Medicine & Rehabilitation

## 2024-01-24 VITALS — BP 114/69 | HR 66 | Ht 62.0 in | Wt 208.4 lb

## 2024-01-24 DIAGNOSIS — M5416 Radiculopathy, lumbar region: Secondary | ICD-10-CM | POA: Diagnosis present

## 2024-01-24 MED ORDER — LIDOCAINE HCL 1 % IJ SOLN
4.0000 mL | Freq: Once | INTRAMUSCULAR | Status: AC
  Administered 2024-01-24: 4 mL

## 2024-01-24 MED ORDER — DEXAMETHASONE SOD PHOSPHATE PF 10 MG/ML IJ SOLN
10.0000 mg | Freq: Once | INTRAMUSCULAR | Status: AC
  Administered 2024-01-24: 10 mg

## 2024-01-24 MED ORDER — LIDOCAINE HCL (PF) 1 % IJ SOLN
2.0000 mL | Freq: Once | INTRAMUSCULAR | Status: AC
  Administered 2024-01-24: 2 mL

## 2024-01-24 MED ORDER — IOHEXOL 180 MG/ML  SOLN
3.0000 mL | Freq: Once | INTRAMUSCULAR | Status: AC
  Administered 2024-01-24: 3 mL via EPIDURAL

## 2024-01-24 NOTE — Progress Notes (Signed)
  PROCEDURE RECORD [facility]   Name: Amy Hooper DOB:Sep 10, 1950 MRN: [id number]  Date:01/24/2024  Physician: Prentice Compton, MD    Nurse/CMA: Jama CMA  Allergies:  Allergies  Allergen Reactions   Biaxin [Clarithromycin] Hives and Rash   Ceftin [Cefuroxime] Hives and Rash   Nsaids Nausea And Vomiting   Lortab [Hydrocodone -Acetaminophen ] Other (See Comments)    Stomach pain   Percocet [Oxycodone -Acetaminophen ] Other (See Comments)    Hallucination   Robaxin  [Methocarbamol ] Nausea And Vomiting and Other (See Comments)    Stomach pain   Thiazide-Type Diuretics Other (See Comments)    Gout flair   Toradol  [Ketorolac  Tromethamine ] Nausea And Vomiting   Ultram  [Tramadol ] Nausea And Vomiting and Other (See Comments)    Stomach pain    Consent Signed: Yes.    Is patient diabetic? No.  CBG today?   Pregnant: No. LMP: No LMP recorded. Patient is postmenopausal. (age 76-55)  Anticoagulants: no Anti-inflammatory: no Antibiotics: no  Procedure: lumbar 3-4 transforaminal epidural steroid injection Position: Prone Start Time: 12:24  End Time: 12:33  Fluoro Time: 1 min  RN/CMA Jhace Fennell RN Lee CMA    Time 12:01 12:40    BP 114/69 127/75    Pulse 66 56    Respirations 14 14    O2 Sat 97 97    S/S 6 6    Pain Level 8 4     D/C home with Miranda, patient A & O X 3, D/C instructions reviewed, and sits independently.

## 2024-01-24 NOTE — Progress Notes (Signed)
 RIght L3-4 Lumbar transforaminal epidural steroid injection under fluoroscopic guidance with contrast enhancement  Indication: Lumbosacral radiculitis is not relieved by medication management or other conservative care and interfering with self-care and mobility.   Informed consent was obtained after describing risk and benefits of the procedure with the patient, this includes bleeding, bruising, infection, paralysis and medication side effects.  The patient wishes to proceed and has given written consent.  Patient was placed in prone position.  The lumbar area was marked and prepped with Betadine .  It was entered with a 25-gauge 1-1/2 inch needle and one mL of 1% lidocaine  was injected into the skin and subcutaneous tissue.  Then a 22-gauge 5 spinal needle was inserted into the R 3-4  intervertebral foramen under AP, lateral, and oblique view.  Once needle tip was within the foramen on lateral views and or exceeding 6 o clock position on the pedicle on AP view, Omnipaque  180 was injected x 2ml Then a solution containing one mL of 10 mg per mL dexamethasone  and 2 mL of 1% lidocaine  was injected.  The patient tolerated procedure well.  Post procedure instructions were given.  Please see post procedure form.

## 2024-01-24 NOTE — Patient Instructions (Signed)

## 2024-01-25 ENCOUNTER — Ambulatory Visit

## 2024-01-25 DIAGNOSIS — R002 Palpitations: Secondary | ICD-10-CM | POA: Diagnosis not present

## 2024-01-25 LAB — CUP PACEART REMOTE DEVICE CHECK
Date Time Interrogation Session: 20251104234619
Implantable Pulse Generator Implant Date: 20250603

## 2024-01-26 ENCOUNTER — Ambulatory Visit: Payer: Self-pay | Admitting: Cardiovascular Disease

## 2024-01-27 ENCOUNTER — Encounter: Payer: Self-pay | Admitting: Nurse Practitioner

## 2024-01-27 ENCOUNTER — Ambulatory Visit (INDEPENDENT_AMBULATORY_CARE_PROVIDER_SITE_OTHER): Payer: Self-pay | Admitting: Nurse Practitioner

## 2024-01-27 VITALS — BP 112/70 | HR 51 | Wt 211.0 lb

## 2024-01-27 DIAGNOSIS — R799 Abnormal finding of blood chemistry, unspecified: Secondary | ICD-10-CM

## 2024-01-27 DIAGNOSIS — M0579 Rheumatoid arthritis with rheumatoid factor of multiple sites without organ or systems involvement: Secondary | ICD-10-CM

## 2024-01-27 DIAGNOSIS — G894 Chronic pain syndrome: Secondary | ICD-10-CM

## 2024-01-27 DIAGNOSIS — G4733 Obstructive sleep apnea (adult) (pediatric): Secondary | ICD-10-CM

## 2024-01-27 DIAGNOSIS — I10 Essential (primary) hypertension: Secondary | ICD-10-CM

## 2024-01-27 DIAGNOSIS — Z8673 Personal history of transient ischemic attack (TIA), and cerebral infarction without residual deficits: Secondary | ICD-10-CM

## 2024-01-27 DIAGNOSIS — M109 Gout, unspecified: Secondary | ICD-10-CM

## 2024-01-27 DIAGNOSIS — E669 Obesity, unspecified: Secondary | ICD-10-CM | POA: Diagnosis not present

## 2024-01-27 DIAGNOSIS — E559 Vitamin D deficiency, unspecified: Secondary | ICD-10-CM

## 2024-01-27 DIAGNOSIS — N1831 Chronic kidney disease, stage 3a: Secondary | ICD-10-CM | POA: Diagnosis not present

## 2024-01-27 DIAGNOSIS — E782 Mixed hyperlipidemia: Secondary | ICD-10-CM

## 2024-01-27 LAB — LIPID PANEL

## 2024-01-27 MED ORDER — ZEPBOUND 2.5 MG/0.5ML ~~LOC~~ SOAJ: 2.5000 mg | SUBCUTANEOUS | 1 refills | Status: DC

## 2024-01-27 MED ORDER — PREGABALIN 50 MG PO CAPS: 50.0000 mg | ORAL_CAPSULE | Freq: Two times a day (BID) | ORAL | 0 refills | Status: AC

## 2024-01-27 NOTE — Progress Notes (Signed)
 Remote Loop Recorder Transmission

## 2024-01-27 NOTE — Progress Notes (Signed)
 [name], DNP, AGNP-c [facility]  [address] [place], [place] [address] [phone]  ESTABLISHED PATIENT- Chronic Health and/or Follow-Up Visit on 01/27/2024  Blood pressure 112/70, pulse (!) 51, weight 211 lb (95.7 kg).   HPI: History of Present Illness Amy Hooper is a 73 year old female with fibromyalgia and chronic pain who presents for pain management and medication review.  She experiences significant pain due to fibromyalgia and a pinched nerve in her back. An injection received on Tuesday provided relief and improved her sleep. She uses a Butrans  patch, starting at 5 mcg/hour and currently at 7.5 mcg/hour, but still experiences pain. She visits monthly for adjustments and denies feeling sleepy or different on the patch.  She takes Lyrica  at 75 mg twice a day but reports increased appetite and weight gain, which she attributes to the medication. She has been prescribed Wegovy  for weight management, but it has not been effective. Her fibromyalgia symptoms worsen with weather changes, and she can predict weather changes based on her pain levels. She describes the pressure changes as 'driving her crazy.'  She is scheduled for knee surgery, which has been delayed for three years due to a back infection. She has been receiving knee injections and gel treatments, but they are no longer effective.  She has been prescribed Repatha  for cholesterol management and reports no issues with shingles after receiving her vaccinations. She denies abdominal tenderness, changes in bowel habits, and urinary issues. She has not had a gout flare-up since stopping hydrocodone  and is considering checking her uric acid levels.  She works part-time, two days one week and three days the next, and is active at work. She lives with her husband, who is retired and very active at home, and she is adjusting to spending more time together.  All ROS negative with exception of  what is listed above.    PHYSICAL EXAM Physical Exam Vitals and nursing note reviewed.  Constitutional:      Appearance: Normal appearance.  HENT:     Head: Normocephalic.  Eyes:     Pupils: Pupils are equal, round, and reactive to light.  Cardiovascular:     Rate and Rhythm: Normal rate and regular rhythm.     Pulses: Normal pulses.     Heart sounds: Normal heart sounds.  Pulmonary:     Effort: Pulmonary effort is normal.     Breath sounds: Normal breath sounds.  Musculoskeletal:        General: Tenderness present. Normal range of motion.     Cervical back: Normal range of motion.  Skin:    General: Skin is warm.     Capillary Refill: Capillary refill takes less than 2 seconds.  Neurological:     General: No focal deficit present.     Mental Status: She is alert and oriented to person, place, and time.     Motor: Weakness present.  Psychiatric:        Mood and Affect: Mood normal.     PLAN Problem List Items Addressed This Visit     Essential hypertension   Blood pressure is well-managed with current regimen.      Relevant Medications   tirzepatide (ZEPBOUND) 2.5 MG/0.5ML Pen   Other Relevant Orders   Hemoglobin A1c (Completed)   CBC with Differential/Platelet (Completed)   Comprehensive metabolic panel with GFR (Completed)   Lipid panel (Completed)   Gout   No current flare noted. Historically has had increased difficulty due to overlap  with chronic pain. Monitoring uric acid levels is important for ongoing management.  - Ordered uric acid level test.      Relevant Orders   Uric acid (Completed)   Hyperlipidemia   Currently managed with diet and exercise. Hopefully that GLP-1 will be beneficial in additional interventions given pain associated with statin therapy.       CKD (chronic kidney disease) stage 3, GFR 30-59 ml/min (HCC) - Primary   Chronic kidney disease stage 3a. Monitoring kidney function is important due to medication use and potential impact  on renal health. - Continue to monitor kidney function regularly.      Relevant Medications   tirzepatide (ZEPBOUND) 2.5 MG/0.5ML Pen   Other Relevant Orders   Hemoglobin A1c (Completed)   CBC with Differential/Platelet (Completed)   Comprehensive metabolic panel with GFR (Completed)   Lipid panel (Completed)   OSA (obstructive sleep apnea)   Discussed potential benefits of Zepbound for weight management, which may also aid in sleep apnea management. - Submitted request for Zepbound coverage.       Relevant Medications   tirzepatide (ZEPBOUND) 2.5 MG/0.5ML Pen   Other Relevant Orders   Hemoglobin A1c (Completed)   CBC with Differential/Platelet (Completed)   Comprehensive metabolic panel with GFR (Completed)   Lipid panel (Completed)   Chronic pain syndrome   Chronic pain syndrome and fibromyalgia with recent exacerbation due to weather changes. Pain management includes Butran patch, currently at 7.5 mg, with plans for gradual increase. Lyrica  reduced to 50 mg twice daily due to weight gain and increased appetite. Recent injection for pinched nerve in back has improved sleep and reduced nerve pain. Discussed potential benefits of Zepbound for weight management and possible anti-inflammatory effects on fibromyalgia. - Continue Butran patch with gradual increase as needed. - Reduced Lyrica  to 50 mg twice daily. - Will consider Zepbound for weight management and potential anti-inflammatory effects.      Relevant Medications   pregabalin  (LYRICA ) 50 MG capsule   Obesity (BMI 30-39.9)   Management with Wegovy  has been ineffective. Weight gain noted, possibly related to Lyrica  use. Discussed potential switch to Zepbound, which may offer more robust weight management benefits and possible anti-inflammatory effects. - Submitted request for Zepbound coverage. - Monitor weight and appetite changes.      Relevant Medications   tirzepatide (ZEPBOUND) 2.5 MG/0.5ML Pen   Other Relevant  Orders   Hemoglobin A1c (Completed)   CBC with Differential/Platelet (Completed)   Comprehensive metabolic panel with GFR (Completed)   Lipid panel (Completed)   Vitamin D  deficiency   Relevant Orders   CBC with Differential/Platelet (Completed)   VITAMIN D  25 Hydroxy (Vit-D Deficiency, Fractures) (Completed)   Rheumatoid arthritis with rheumatoid factor of multiple sites without organ or systems involvement (HCC)   Relevant Medications   pregabalin  (LYRICA ) 50 MG capsule   Other Visit Diagnoses       Abnormal finding of blood chemistry, unspecified       Relevant Orders   Hemoglobin A1c (Completed)        Return in about 4 months (around 05/26/2024).  SaraBeth Grayland Daisey, DNP, AGNP-c Time: 44 minutes, >50% spent counseling, care coordination, chart review, and documentation.  SABRAtime

## 2024-01-28 ENCOUNTER — Encounter: Payer: Self-pay | Admitting: Nurse Practitioner

## 2024-01-28 LAB — CBC WITH DIFFERENTIAL/PLATELET
Basophils Absolute: 0.1 x10E3/uL (ref 0.0–0.2)
Basos: 1 %
EOS (ABSOLUTE): 0.2 x10E3/uL (ref 0.0–0.4)
Eos: 4 %
Hematocrit: 41.3 % (ref 34.0–46.6)
Hemoglobin: 12.7 g/dL (ref 11.1–15.9)
Immature Grans (Abs): 0 x10E3/uL (ref 0.0–0.1)
Immature Granulocytes: 0 %
Lymphocytes Absolute: 3.1 x10E3/uL (ref 0.7–3.1)
Lymphs: 51 %
MCH: 28.9 pg (ref 26.6–33.0)
MCHC: 30.8 g/dL — ABNORMAL LOW (ref 31.5–35.7)
MCV: 94 fL (ref 79–97)
Monocytes Absolute: 0.6 x10E3/uL (ref 0.1–0.9)
Monocytes: 10 %
Neutrophils Absolute: 2.1 x10E3/uL (ref 1.4–7.0)
Neutrophils: 34 %
Platelets: 322 x10E3/uL (ref 150–450)
RBC: 4.39 x10E6/uL (ref 3.77–5.28)
RDW: 14.5 % (ref 11.7–15.4)
WBC: 6.1 x10E3/uL (ref 3.4–10.8)

## 2024-01-28 LAB — COMPREHENSIVE METABOLIC PANEL WITH GFR
ALT: 9 IU/L (ref 0–32)
AST: 14 IU/L (ref 0–40)
Albumin: 3.6 g/dL — ABNORMAL LOW (ref 3.9–4.9)
Alkaline Phosphatase: 122 IU/L (ref 49–135)
BUN/Creatinine Ratio: 14 (ref 12–28)
BUN: 21 mg/dL (ref 8–27)
Bilirubin Total: 0.3 mg/dL (ref 0.0–1.2)
CO2: 23 mmol/L (ref 20–29)
Calcium: 9.5 mg/dL (ref 8.7–10.3)
Chloride: 107 mmol/L — ABNORMAL HIGH (ref 96–106)
Creatinine, Ser: 1.48 mg/dL — ABNORMAL HIGH (ref 0.57–1.00)
Globulin, Total: 3 g/dL (ref 1.5–4.5)
Glucose: 75 mg/dL (ref 70–99)
Potassium: 4.6 mmol/L (ref 3.5–5.2)
Sodium: 142 mmol/L (ref 134–144)
Total Protein: 6.6 g/dL (ref 6.0–8.5)
eGFR: 38 mL/min/1.73 — ABNORMAL LOW (ref >59)

## 2024-01-28 LAB — URIC ACID: Uric Acid: 3.4 mg/dL (ref 3.0–7.2)

## 2024-01-28 LAB — LIPID PANEL
Chol/HDL Ratio: 2.8 ratio (ref 0.0–4.4)
Cholesterol, Total: 171 mg/dL (ref 100–199)
HDL: 62 mg/dL (ref >39)
LDL Chol Calc (NIH): 87 mg/dL (ref 0–99)
Triglycerides: 127 mg/dL (ref 0–149)
VLDL Cholesterol Cal: 22 mg/dL (ref 5–40)

## 2024-01-28 LAB — HEMOGLOBIN A1C
Est. average glucose Bld gHb Est-mCnc: 100 mg/dL
Hgb A1c MFr Bld: 5.1 % (ref 4.8–5.6)

## 2024-01-28 LAB — VITAMIN D 25 HYDROXY (VIT D DEFICIENCY, FRACTURES): Vit D, 25-Hydroxy: 43.5 ng/mL (ref 30.0–100.0)

## 2024-01-31 ENCOUNTER — Other Ambulatory Visit (INDEPENDENT_AMBULATORY_CARE_PROVIDER_SITE_OTHER): Payer: Self-pay

## 2024-01-31 ENCOUNTER — Ambulatory Visit (INDEPENDENT_AMBULATORY_CARE_PROVIDER_SITE_OTHER): Admitting: Orthopaedic Surgery

## 2024-01-31 ENCOUNTER — Ambulatory Visit: Payer: Self-pay | Admitting: Nurse Practitioner

## 2024-01-31 ENCOUNTER — Telehealth: Payer: Self-pay

## 2024-01-31 ENCOUNTER — Other Ambulatory Visit (HOSPITAL_COMMUNITY): Payer: Self-pay

## 2024-01-31 DIAGNOSIS — R944 Abnormal results of kidney function studies: Secondary | ICD-10-CM

## 2024-01-31 DIAGNOSIS — M1711 Unilateral primary osteoarthritis, right knee: Secondary | ICD-10-CM

## 2024-01-31 DIAGNOSIS — R7989 Other specified abnormal findings of blood chemistry: Secondary | ICD-10-CM

## 2024-01-31 DIAGNOSIS — R770 Abnormality of albumin: Secondary | ICD-10-CM

## 2024-01-31 NOTE — Telephone Encounter (Signed)
 Pharmacy Patient Advocate Encounter   Received notification from Patient Advice Request messages that prior authorization for Zepbound 2.5MG /0.5ML pen-injectors  is required/requested.   Insurance verification completed.   The patient is insured through [facility].   Per test claim: PA required; PA submitted to above mentioned insurance via Latent Key/confirmation #/EOC [id number] Status is pending

## 2024-01-31 NOTE — Progress Notes (Signed)
 Office Visit Note   [name]           Date of Birth: [birth year]           MRN: [id number] Visit Date: 01/31/2024              Requested by: Oris Camie BRAVO, NP [address] [place],  [place] [address] PCP: Oris Camie BRAVO, NP   Assessment & Plan: Visit Diagnoses:  1. Primary osteoarthritis of right knee     Plan: History of Present Illness Amy Hooper is a 73 year old female with osteoarthritis who presents for evaluation of her right knee pain and consideration for knee replacement surgery.  She experiences significant pain and instability in her right knee, with episodes of buckling that nearly caused a fall. Previous treatments include physical therapy and gel injections, with the last injection in August. She underwent a successful left knee replacement in 2021 or 2022, resulting in a stable knee with no pain, though she has some soreness due to fibromyalgia. Her medical history includes osteoarthritis affecting her knees and a positive rheumatoid factor without a rheumatoid arthritis diagnosis. She takes baby aspirin  and has gout in her right toe. She is concerned about her knee issues affecting her ability to walk during an upcoming river cruise from [place] to [place].  Physical Exam MUSCULOSKELETAL: Swelling in the knee.  Pain and crepitus with motion of the knee.  Assessment and Plan Right knee primary osteoarthritis Chronic osteoarthritis with recent exacerbation. Previous gel injections effective until recently. Knee instability noted. Positive rheumatoid factor without rheumatoid arthritis. Gout and fibromyalgia present. Successful left knee replacement in 2021. - Obtain medical clearance for knee replacement surgery. - Schedule knee replacement surgery promptly.  Impression is severe right knee degenerative joint disease secondary to Osteoarthritis.  Patient has attempted conservative treatment for at least 6 consecutive weeks  within the past 12 weeks, including but not limited to physical therapy, home exercise program, NSAIDs, activity modification, and/or corticosteroid injections. Despite these efforts, symptoms have not improved or have worsened. Conservative measures have been deemed unsuccessful at this time. After a detailed discussion covering diagnosis and treatment options--including the risks, benefits, alternatives, and potential complications of surgical and nonsurgical management--the patient elected to proceed with surgery  Anticoagulants: No antithrombotic Postop anticoagulation: Aspirin  81 mg Diabetic: No  Nickel allergy: No Prior DVT/PE: No Tobacco use: No Clearances needed for surgery: PCP Anticipated discharge dispo: Home   Follow-Up Instructions: No follow-ups on file.   Orders:  Orders Placed This Encounter  Procedures   XR KNEE 3 VIEW RIGHT   No orders of the defined types were placed in this encounter.     Procedures: No procedures performed   Clinical Data: No additional findings.   Subjective: Chief Complaint  Patient presents with   Right Knee - Pain    HPI  Review of Systems  Constitutional: Negative.   HENT: Negative.    Eyes: Negative.   Respiratory: Negative.    Cardiovascular: Negative.   Endocrine: Negative.   Musculoskeletal: Negative.   Neurological: Negative.   Hematological: Negative.   Psychiatric/Behavioral: Negative.    All other systems reviewed and are negative.    Objective: Vital Signs: There were no vitals taken for this visit.  Physical Exam Vitals and nursing note reviewed.  Constitutional:      Appearance: She is well-developed.  HENT:     Head: Atraumatic.     Nose: Nose normal.  Eyes:  Extraocular Movements: Extraocular movements intact.  Cardiovascular:     Pulses: Normal pulses.  Pulmonary:     Effort: Pulmonary effort is normal.  Abdominal:     Palpations: Abdomen is soft.  Musculoskeletal:     Cervical back:  Neck supple.  Skin:    General: Skin is warm.     Capillary Refill: Capillary refill takes less than 2 seconds.  Neurological:     Mental Status: She is alert. Mental status is at baseline.  Psychiatric:        Behavior: Behavior normal.        Thought Content: Thought content normal.        Judgment: Judgment normal.     Ortho Exam  Specialty Comments:  No specialty comments available.  Imaging: XR KNEE 3 VIEW RIGHT Result Date: 01/31/2024 X-rays demonstrate severe tricompartmental osteoarthritis.  Bone-on-bone joint space narrowing.  Kellgren-Lawrence stage IV    PMFS History: Patient Active Problem List   Diagnosis Date Noted   Right lumbar radiculitis 12/22/2023   Facet arthropathy, lumbosacral 12/22/2023   Postlaminectomy syndrome, lumbar region 12/22/2023   History of stroke 10/24/2023   Abnormal weight gain 10/24/2023   Statin myopathy 10/24/2023   Obesity (BMI 30-39.9) 08/18/2023   Premature atrial complexes 12/20/2022   Forearm tendonitis 07/23/2022   Asymptomatic bradycardia 07/23/2022   Rheumatoid arthritis with rheumatoid factor of multiple sites without organ or systems involvement (HCC) 04/30/2022   Osteopenia 03/03/2022   Degenerative spondylolisthesis 02/19/2021   Status post total left knee replacement 09/08/2020   OSA (obstructive sleep apnea) 05/19/2020   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/17/2020   Chronic pain syndrome 03/03/2020   S/P lumbar fusion 02/05/2020   Vitamin D  deficiency 01/09/2020   Hyperlipidemia 01/09/2020   CKD (chronic kidney disease) stage 3, GFR 30-59 ml/min (HCC) 01/09/2020   Essential hypertension    Gout    Fibromyalgia    Depression    Insomnia    Chronically low serum potassium 04/22/2014   Environmental and seasonal allergies 04/22/2014   Past Medical History:  Diagnosis Date   Allergy 1976   Tramadol  NSAIDS   Chronic kidney disease    CVA (cerebrovascular accident) (HCC) 08/18/2023    Depression    Fibromyalgia    Gout    HTN (hypertension)    Hyperlipidemia 09/01/22   Insomnia    Morbid obesity (HCC)    Morbid obesity (HCC)    Nocturnal leg cramps 07/27/2023   Non-recurrent acute suppurative otitis media of left ear without spontaneous rupture of tympanic membrane 04/30/2022   Osteoarthritis    Palpitation    Palpitations 04/22/2014   Rash and nonspecific skin eruption 07/23/2022   Sleep apnea    Stroke (HCC) 08/23/2023    Family History  Problem Relation Age of Onset   Hypothyroidism Mother    Hyperlipidemia Mother    Miscarriages / Stillbirths Mother    Hyperlipidemia Other    Kidney failure Father    Dementia Father    Gout Father    Arthritis Father    Prostate cancer Father    Heart disease Father    Hypertension Father    Kidney disease Father    Stroke Father     Past Surgical History:  Procedure Laterality Date   APPLICATION OF WOUND VAC N/A 03/25/2021   Procedure: APPLICATION OF WOUND VAC;  Surgeon: Barbarann Oneil BROCKS, MD;  Location: [facility] OR;  Service: Orthopedics;  Laterality: N/A;   APPLICATION OF WOUND VAC N/A 03/30/2021  Procedure: WOUND VAC CHANGE 12x6x5;  Surgeon: Barbarann Oneil BROCKS, MD;  [facility];  Service: Orthopedics;  Laterality: N/A;   INCISION AND DRAINAGE OF WOUND N/A 03/25/2021   Procedure: LUMBAR POST OP INCISION IRRIGATION;  Surgeon: Barbarann Oneil BROCKS, MD;  Location: [facility] OR;  Service: Orthopedics;  Laterality: N/A;   JOINT REPLACEMENT     KNEE ARTHROSCOPY     LOOP RECORDER INSERTION N/A 08/23/2023   Procedure: LOOP RECORDER INSERTION;  Surgeon: Nancey Eulas BRAVO, MD;  Location: [facility] INVASIVE CV LAB;  Service: Cardiovascular;  Laterality: N/A;   LUMBAR WOUND DEBRIDEMENT N/A 03/30/2021   Procedure: REPEAT LUMBAR WOUND DEBRIDEMENT;  Surgeon: Barbarann Oneil BROCKS, MD;  Location: [facility] OR;  Service: Orthopedics;  Laterality: N/A;   right rotator cuff     ROTATOR CUFF REPAIR Left    TOTAL KNEE ARTHROPLASTY Left 09/08/2020   Procedure: LEFT TOTAL KNEE  ARTHROPLASTY;  Surgeon: Jerri Kay HERO, MD;  Location: [facility] OR;  Service: Orthopedics;  Laterality: Left;   TUBAL LIGATION     Social History   Occupational History   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

## 2024-01-31 NOTE — Telephone Encounter (Signed)
 Pharmacy Patient Advocate Encounter  Received notification from [employer] that Prior Authorization for Zepbound 2.5MG /0.5ML pen-injectors  has been APPROVED to 12.31.26. Ran test claim, Copay is $0.00. This test claim was processed through [facility]- copay amounts may vary at other pharmacies due to pharmacy/plan contracts, or as the patient moves through the different stages of their insurance plan.   PA #/Case ID/Reference #: [id number]

## 2024-02-01 ENCOUNTER — Telehealth: Payer: Self-pay | Admitting: Registered Nurse

## 2024-02-01 ENCOUNTER — Telehealth: Payer: Self-pay | Admitting: Nurse Practitioner

## 2024-02-01 DIAGNOSIS — M47817 Spondylosis without myelopathy or radiculopathy, lumbosacral region: Secondary | ICD-10-CM

## 2024-02-01 DIAGNOSIS — G8929 Other chronic pain: Secondary | ICD-10-CM

## 2024-02-01 DIAGNOSIS — M961 Postlaminectomy syndrome, not elsewhere classified: Secondary | ICD-10-CM

## 2024-02-01 MED ORDER — BUPRENORPHINE 7.5 MCG/HR TD PTWK: 1.0000 | MEDICATED_PATCH | TRANSDERMAL | 2 refills | Status: AC

## 2024-02-01 NOTE — Telephone Encounter (Signed)
 PDMP was reviewed.  Butrans  e-scribed to pharmacy. Ms. Muldrow is aware via My-Chart

## 2024-02-01 NOTE — Telephone Encounter (Signed)
 [facility] clearance form received and sent back in folder

## 2024-02-02 NOTE — Telephone Encounter (Signed)
 Form completed and risk stratification letter written.

## 2024-02-04 ENCOUNTER — Encounter: Payer: Self-pay | Admitting: Nurse Practitioner

## 2024-02-05 ENCOUNTER — Other Ambulatory Visit: Payer: Self-pay | Admitting: Nurse Practitioner

## 2024-02-05 DIAGNOSIS — G4709 Other insomnia: Secondary | ICD-10-CM

## 2024-02-05 DIAGNOSIS — I10 Essential (primary) hypertension: Secondary | ICD-10-CM

## 2024-02-05 DIAGNOSIS — Z8739 Personal history of other diseases of the musculoskeletal system and connective tissue: Secondary | ICD-10-CM

## 2024-02-05 DIAGNOSIS — E782 Mixed hyperlipidemia: Secondary | ICD-10-CM

## 2024-02-05 DIAGNOSIS — N1832 Chronic kidney disease, stage 3b: Secondary | ICD-10-CM

## 2024-02-05 DIAGNOSIS — Z8673 Personal history of transient ischemic attack (TIA), and cerebral infarction without residual deficits: Secondary | ICD-10-CM

## 2024-02-06 NOTE — Assessment & Plan Note (Signed)
 Blood pressure is well-managed with current regimen.

## 2024-02-06 NOTE — Assessment & Plan Note (Signed)
 Currently managed with diet and exercise. Hopefully that GLP-1 will be beneficial in additional interventions given pain associated with statin therapy.

## 2024-02-06 NOTE — Telephone Encounter (Signed)
 Last appt:04/08/23

## 2024-02-06 NOTE — Assessment & Plan Note (Signed)
 Chronic kidney disease stage 3a. Monitoring kidney function is important due to medication use and potential impact on renal health. - Continue to monitor kidney function regularly.

## 2024-02-06 NOTE — Assessment & Plan Note (Signed)
 Management with Wegovy  has been ineffective. Weight gain noted, possibly related to Lyrica  use. Discussed potential switch to Zepbound, which may offer more robust weight management benefits and possible anti-inflammatory effects. - Submitted request for Zepbound coverage. - Monitor weight and appetite changes.

## 2024-02-06 NOTE — Assessment & Plan Note (Signed)
 Discussed potential benefits of Zepbound for weight management, which may also aid in sleep apnea management. - Submitted request for Zepbound coverage.

## 2024-02-06 NOTE — Assessment & Plan Note (Signed)
 Chronic pain syndrome and fibromyalgia with recent exacerbation due to weather changes. Pain management includes Butran patch, currently at 7.5 mg, with plans for gradual increase. Lyrica  reduced to 50 mg twice daily due to weight gain and increased appetite. Recent injection for pinched nerve in back has improved sleep and reduced nerve pain. Discussed potential benefits of Zepbound for weight management and possible anti-inflammatory effects on fibromyalgia. - Continue Butran patch with gradual increase as needed. - Reduced Lyrica  to 50 mg twice daily. - Will consider Zepbound for weight management and potential anti-inflammatory effects.

## 2024-02-06 NOTE — Assessment & Plan Note (Signed)
 No current flare noted. Historically has had increased difficulty due to overlap with chronic pain. Monitoring uric acid levels is important for ongoing management.  - Ordered uric acid level test.

## 2024-02-07 ENCOUNTER — Encounter: Payer: Self-pay | Admitting: Orthopaedic Surgery

## 2024-02-08 ENCOUNTER — Other Ambulatory Visit: Payer: Self-pay | Admitting: Nurse Practitioner

## 2024-02-08 DIAGNOSIS — E669 Obesity, unspecified: Secondary | ICD-10-CM

## 2024-02-08 DIAGNOSIS — G4733 Obstructive sleep apnea (adult) (pediatric): Secondary | ICD-10-CM

## 2024-02-08 DIAGNOSIS — N1831 Chronic kidney disease, stage 3a: Secondary | ICD-10-CM

## 2024-02-08 DIAGNOSIS — I10 Essential (primary) hypertension: Secondary | ICD-10-CM

## 2024-02-08 MED ORDER — TIRZEPATIDE 5 MG/0.5ML ~~LOC~~ SOAJ: 5.0000 mg | SUBCUTANEOUS | 1 refills | Status: DC

## 2024-02-08 MED ORDER — TIRZEPATIDE 7.5 MG/0.5ML ~~LOC~~ SOAJ: 7.5000 mg | SUBCUTANEOUS | 1 refills | Status: DC

## 2024-02-12 NOTE — Progress Notes (Signed)
 [facility]  Urgent Care Provider Note   Provider at bedside: 9:01 AM  History obtained from the: Patient  HISTORY   PATIENT ID: Amy Hooper is a 73 y.o. female.  CHIEF COMPLAINT: Chief Complaint  Patient presents with  . Eye Problem    Pt presents with right eye pain that began Friday states was sitting at her desk and suddenly started to have blurry vision and eye pain. Reports no loss of vision denies having more than normal eye floaters. Reports eye drainage that started this morning. Has been using allergy eye drops without relief of sx.      ALLERGIES: Allergies[1]   PAST MEDICAL HISTORY: PMH - Chronic idiopathic gout involving toe of right foot without tophus Fibromyalgia Hypertension  CURRENT MEDICATIONS: Current Medications[2]  ROS  All other symptoms are reviewed and are negative except those listed in HPI   HPI   Amy Hooper is a 73 y.o. female  presents to Urgent care   History of Present Illness The patient is a female with seasonal allergies presenting with eye irritation and pain.  Eye Irritation and Pain - Ocular discomfort began Thursday at work, described as foreign body sensation, itching, pain, and drainage. - Suspects allergic reaction to food item, specific allergen unknown. - Pain described as ice pick-like. - No rubbing of eyes reported. - No current foreign body sensation. - Redness developed overnight, possibly due to inadvertent irritation. - Managed symptoms with Claritin  and OTC antihistamine eye drops, with some relief. - Last ophthalmologist consultation 1.5 years ago.  PHYSICAL EXAM   Vitals:   02/12/24 0854  BP: 159/66  Pulse: 68  Resp: 18  Temp: 98.1 F (36.7 C)  TempSrc: Oral  SpO2: 100%  Weight: 93.4 kg (206 lb)  Height: 1.575 m (5' 2)     Physical Exam Vitals and nursing note reviewed.  Constitutional:      General: She is not in acute distress.    Appearance:  Normal appearance. She is not ill-appearing, toxic-appearing or diaphoretic.  HENT:     Head: Normocephalic and atraumatic.     Right Ear: Tympanic membrane, ear canal and external ear normal.     Left Ear: Tympanic membrane, ear canal and external ear normal.     Nose: Nose normal.     Mouth/Throat:     Mouth: Mucous membranes are moist.  Eyes:     Conjunctiva/sclera: Conjunctivae normal.     Comments: PROCEDURE NOTE: Woods lamp exam performed by Channing Sero FNP-C Consent:  Verbal provided by patient after discussion of procedure including risks/benefits/alternatives Anesthesia: Tetracaine  opth gtts. Fluorescein  strip used for staining. PERRLA, EOMs intact, No Foreign body. Discharge:    R   clear       Gross vision intact. Lid flip negative.  Conjunctival injection:   Significant lower lid conjunctival swelling Lateral aspect no significant preseptal periorbital swelling. No Superficial corneal abrasion     Patient tolerated the procedure well Complications: None   Cardiovascular:     Rate and Rhythm: Normal rate and regular rhythm.  Pulmonary:     Effort: Pulmonary effort is normal. No respiratory distress.  Skin:    General: Skin is warm and dry.  Neurological:     Mental Status: She is alert.        RESULTS  No results found for this visit on 02/12/24.   ASSESSMENT/PLAN/MDM   1. Acute bacterial conjunctivitis of right eye   2. Allergic reaction,  initial encounter      Amy Hooper is a 73 y.o. female  presents to Urgent care       UC DISPOSITION   Follow up with PCP  Patient Instructions  Use eye drops as directed for the next 5 days along with cool compresses- add zyrtec or flonase  with your claritin . Follow up with eye specialist if not improving over the next 48 hours- sooner to ED if loss of vision or worse for immediate evaluation   Hand out provided, I discussed the findings today, diagnosis/differential diagnosis, plan and red flags that  require return for reevaluation with PCP,  Urgent care or EMERGENCY. Patient/representitive was agreeable to outlined plan. Questions were answered and patient is stable for discharge.  Provider time spent in patient care today, inclusive of but not limited to clinical reassessment, review of diagnostic studies, and discharge preparation, was less than 30 minutes.  This document was created using the aid of voice recognition Scientist, clinical (histocompatibility and immunogenetics).  Electrically signed by Channing Sero ENP-C MSN at 9:44 AM        [1] Allergies Allergen Reactions  . Clarithromycin Hives  . Hydrocodone -Acetaminophen  Other (See Comments)    Stomach upset,   . Ketorolac  Tromethamine  Nausea And Vomiting  . Methocarbamol  Nausea And Vomiting and Other (See Comments)    It tears my stomach up.  Stomach pain  . Nsaids (Non-Steroidal Anti-Inflammatory Drug) Other (See Comments)    Upset stomach  . Oxycodone -Acetaminophen  Other (See Comments)    Hallucination  . Tramadol  Other (See Comments)    Upset stomach  . Cefuroxime Axetil Hives  [2] .  acetaminophen  (TYLENOL ) 500 mg tablet .  allopurinoL  (ZYLOPRIM ) 100 mg tablet .  amLODIPine  (NORVASC ) 5 mg tablet .  aspirin  81 mg EC tablet .  buprenorphine  (BUTRANS ) 7.5 mcg/hour ptwk patch .  dapagliflozin  propanediol (FARXIGA ) 5 mg tab tablet .  diclofenac sodium (VOLTAREN) 1 % gel .  DULoxetine  (CYMBALTA ) 30 mg capsule .  ergocalciferol  (VITAMIN D2) 1,250 mcg (50,000 unit) capsule .  eszopiclone  (LUNESTA ) 2 mg tablet .  fluticasone  propionate (FLONASE ) 50 mcg/spray nasal spray .  loratadine  (CLARITIN ) 10 mg tablet .  losartan  (COZAAR ) 50 mg tablet .  multivit-mineral-iron-lutein (Theratrum Complete with Lutein) tab .  pregabalin  (LYRICA ) 50 mg capsule .  propranoloL  (INDERAL ) 10 mg tablet .  Repatha  Syringe 140 mg/mL syrg .  sennosides-docusate sodium  (PERICOLACE) 8.6-50 mg per tablet .  atorvastatin  (LIPITOR) 10 mg tablet .  celecoxib (CeleBREX)  200 mg capsule .  dexAMETHasone  sodium phosphate  (DECADRON ) 0.1 % ophthalmic solution .  HYDROcodone -acetaminophen  (NORCO) 7.5-325 mg per tablet .  olopatadine (PATANOL) 0.1 % ophthalmic solution .  polymyxin B sulf-trimethoprim (POLYTRIM) 10,000 unit- 1 mg/mL ophthalmic solution .  promethazine -dextromethorphan (PHENERGAN  DM) 6.25-15 mg/5 mL syrp syrup

## 2024-02-13 ENCOUNTER — Other Ambulatory Visit: Payer: Self-pay

## 2024-02-13 ENCOUNTER — Encounter (HOSPITAL_BASED_OUTPATIENT_CLINIC_OR_DEPARTMENT_OTHER): Payer: Self-pay | Admitting: *Deleted

## 2024-02-13 ENCOUNTER — Other Ambulatory Visit (HOSPITAL_COMMUNITY): Payer: Self-pay

## 2024-02-13 ENCOUNTER — Emergency Department (HOSPITAL_BASED_OUTPATIENT_CLINIC_OR_DEPARTMENT_OTHER)

## 2024-02-13 ENCOUNTER — Emergency Department (HOSPITAL_BASED_OUTPATIENT_CLINIC_OR_DEPARTMENT_OTHER)
Admission: EM | Admit: 2024-02-13 | Discharge: 2024-02-13 | Disposition: A | Discharge status: Other Acute Inpatient Hospital | Attending: Emergency Medicine | Admitting: Emergency Medicine

## 2024-02-13 ENCOUNTER — Telehealth: Payer: Self-pay | Admitting: Pharmacy Technician

## 2024-02-13 DIAGNOSIS — Z7982 Long term (current) use of aspirin: Secondary | ICD-10-CM | POA: Insufficient documentation

## 2024-02-13 DIAGNOSIS — H05011 Cellulitis of right orbit: Secondary | ICD-10-CM

## 2024-02-13 DIAGNOSIS — H11421 Conjunctival edema, right eye: Secondary | ICD-10-CM | POA: Diagnosis not present

## 2024-02-13 DIAGNOSIS — H5711 Ocular pain, right eye: Secondary | ICD-10-CM | POA: Diagnosis present

## 2024-02-13 DIAGNOSIS — E669 Obesity, unspecified: Secondary | ICD-10-CM

## 2024-02-13 DIAGNOSIS — G4733 Obstructive sleep apnea (adult) (pediatric): Secondary | ICD-10-CM

## 2024-02-13 DIAGNOSIS — Z8673 Personal history of transient ischemic attack (TIA), and cerebral infarction without residual deficits: Secondary | ICD-10-CM

## 2024-02-13 DIAGNOSIS — E782 Mixed hyperlipidemia: Secondary | ICD-10-CM

## 2024-02-13 DIAGNOSIS — I10 Essential (primary) hypertension: Secondary | ICD-10-CM

## 2024-02-13 DIAGNOSIS — N1831 Chronic kidney disease, stage 3a: Secondary | ICD-10-CM

## 2024-02-13 LAB — CBC WITH DIFFERENTIAL/PLATELET
Abs Immature Granulocytes: 0.01 K/uL (ref 0.00–0.07)
Basophils Absolute: 0 K/uL (ref 0.0–0.1)
Basophils Relative: 0 %
Eosinophils Absolute: 0.1 K/uL (ref 0.0–0.5)
Eosinophils Relative: 1 %
HCT: 40.6 % (ref 36.0–46.0)
Hemoglobin: 13.8 g/dL (ref 12.0–15.0)
Immature Granulocytes: 0 %
Lymphocytes Relative: 30 %
Lymphs Abs: 2.3 K/uL (ref 0.7–4.0)
MCH: 29.6 pg (ref 26.0–34.0)
MCHC: 34 g/dL (ref 30.0–36.0)
MCV: 87.1 fL (ref 80.0–100.0)
Monocytes Absolute: 1 K/uL (ref 0.1–1.0)
Monocytes Relative: 13 %
Neutro Abs: 4.4 K/uL (ref 1.7–7.7)
Neutrophils Relative %: 56 %
Platelets: 343 K/uL (ref 150–400)
RBC: 4.66 MIL/uL (ref 3.87–5.11)
RDW: 14.2 % (ref 11.5–15.5)
WBC: 7.7 K/uL (ref 4.0–10.5)
nRBC: 0 % (ref 0.0–0.2)

## 2024-02-13 LAB — BASIC METABOLIC PANEL WITH GFR
Anion gap: 11 (ref 5–15)
BUN: 28 mg/dL — ABNORMAL HIGH (ref 8–23)
CO2: 26 mmol/L (ref 22–32)
Calcium: 10.5 mg/dL — ABNORMAL HIGH (ref 8.9–10.3)
Chloride: 105 mmol/L (ref 98–111)
Creatinine, Ser: 1.57 mg/dL — ABNORMAL HIGH (ref 0.44–1.00)
GFR, Estimated: 35 mL/min — ABNORMAL LOW (ref >60)
Glucose, Bld: 86 mg/dL (ref 70–99)
Potassium: 4.2 mmol/L (ref 3.5–5.1)
Sodium: 142 mmol/L (ref 135–145)

## 2024-02-13 MED ORDER — IOHEXOL 300 MG/ML  SOLN
100.0000 mL | Freq: Once | INTRAMUSCULAR | Status: AC | PRN
  Administered 2024-02-13: 75 mL via INTRAVENOUS

## 2024-02-13 MED ORDER — TETRACAINE HCL 0.5 % OP SOLN
1.0000 [drp] | Freq: Once | OPHTHALMIC | Status: AC
  Administered 2024-02-13: 1 [drp] via OPHTHALMIC
  Filled 2024-02-13: qty 4

## 2024-02-13 MED ORDER — MORPHINE SULFATE (PF) 4 MG/ML IV SOLN
4.0000 mg | Freq: Once | INTRAVENOUS | Status: AC
  Administered 2024-02-13: 4 mg via INTRAVENOUS
  Filled 2024-02-13: qty 1

## 2024-02-13 MED ORDER — ZEPBOUND 7.5 MG/0.5ML ~~LOC~~ SOAJ: 7.5000 mg | SUBCUTANEOUS | 0 refills | Status: DC

## 2024-02-13 MED ORDER — GATIFLOXACIN 0.5 % OP SOLN: 1.0000 [drp] | Freq: Four times a day (QID) | OPHTHALMIC | 0 refills | Status: AC

## 2024-02-13 MED ORDER — OXYCODONE HCL 5 MG PO TABS
5.0000 mg | ORAL_TABLET | Freq: Once | ORAL | Status: AC
  Administered 2024-02-13: 5 mg via ORAL
  Filled 2024-02-13: qty 1

## 2024-02-13 MED ORDER — PIPERACILLIN-TAZOBACTAM 3.375 G IVPB 30 MIN
3.3750 g | Freq: Once | INTRAVENOUS | Status: AC
  Administered 2024-02-13: 3.375 g via INTRAVENOUS
  Filled 2024-02-13: qty 50

## 2024-02-13 MED ORDER — FLUORESCEIN SODIUM 1 MG OP STRP
1.0000 | ORAL_STRIP | Freq: Once | OPHTHALMIC | Status: AC
  Administered 2024-02-13: 1 via OPHTHALMIC
  Filled 2024-02-13: qty 1

## 2024-02-13 MED ORDER — ZEPBOUND 5 MG/0.5ML ~~LOC~~ SOAJ: 5.0000 mg | SUBCUTANEOUS | 0 refills | Status: DC

## 2024-02-13 NOTE — ED Notes (Signed)
 Per patient, Butrane patch in place, PT states she does not take tylenol  or ibuprofen for pain due to kidney issues. Pain 10/10, Right side of head behind right eye as well as eye pain. Pain described as sharp, stabbing pain

## 2024-02-13 NOTE — ED Notes (Signed)
 Spoke with CT about [facility] radiology imaging with [facility]. Radiology acknowledged and reports will [facility] now

## 2024-02-13 NOTE — ED Notes (Signed)
 IV secured for POV transfer. Pt and husband directed to go directly to [facility] ER for continuation of care, pt and husband verbalized understanding and had no further questions at time of departure. Pt d/c/transfer instructions, medications, and follow-up care reviewed with pt. Pt CA&Ox4, ambulatory, and in NAD at time of d/c

## 2024-02-13 NOTE — ED Notes (Signed)
 Labs drawn and sent, tolerated well, Red and Blue Save tubes drawn.

## 2024-02-13 NOTE — Telephone Encounter (Signed)
 Pharmacy Patient Advocate Encounter   Received notification from Onbase that prior authorization for Mounjaro  is required/requested.   Insurance verification completed.   The patient is insured through [employer].  Patient has an approved prior authorization on file for Zepbound . The order was placed for Mounjaro  5mg  and Mounjaro  7.5mg . Please advise on patient current therapy.   CMM Key# [id number]

## 2024-02-13 NOTE — ED Notes (Signed)
 [facility] contact [place]. aware with pt coming with IV, POV

## 2024-02-13 NOTE — ED Provider Notes (Signed)
 [place] EMERGENCY DEPARTMENT AT [facility] Provider Note   CSN: [id number] Arrival date & time: 02/13/24  1007     Patient presents with: Eye Problem   Amy Hooper is a 73 y.o. female.   HPI   73 year old female presents the emergency department with right eye swelling and pain.  Her symptoms started last week on Thursday.  She woke up Friday with a foreign body sensation.  This worsened over the weekend, the eye became red.  She was evaluated at urgent care, given 2 different drops of the name she cannot recall.  She presents today now with eye swelling, pain with movement and right-sided headache.  No fever or other acute symptoms.  Vision is slightly blurry in the right eye but no vision loss.  No temporal tenderness.  Prior to Admission medications   Medication Sig Start Date End Date Taking? Authorizing Provider  allopurinol  (ZYLOPRIM ) 100 MG tablet TAKE 2 TABLETS BY MOUTH DAILY 02/06/24   Early, Sara E, NP  amLODipine  (NORVASC ) 5 MG tablet TAKE 1 TABLET BY MOUTH DAILY  (NEED APPT) 02/06/24   Early, Camie BRAVO, NP  aspirin  EC 81 MG tablet Take 1 tablet (81 mg total) by mouth daily. Swallow whole. 08/23/23   Akula, Vijaya, MD  buprenorphine  (BUTRANS ) 7.5 MCG/HR Place 1 patch onto the skin once a week. 02/01/24   Debby Fidela CROME, NP  cyclobenzaprine  (FLEXERIL ) 5 MG tablet Take 1 tablet (5 mg total) by mouth at bedtime. 08/26/23   Love, Sharlet RAMAN, PA-C  dapagliflozin  propanediol (FARXIGA ) 5 MG TABS tablet TAKE 1 TABLET BY MOUTH DAILY 02/06/24   Early, Sara E, NP  DULoxetine  (CYMBALTA ) 20 MG capsule TAKE 1 CAPSULE BY MOUTH DAILY 09/01/23   Raulkar, Sven SQUIBB, MD  eszopiclone  (LUNESTA ) 1 MG TABS tablet TAKE 1 TABLET BY MOUTH  IMMEDIATELY BEFORE BEDTIME AS  NEEDED FOR SLEEP 02/06/24   Early, Sara E, NP  Evolocumab  (REPATHA ) 140 MG/ML SOSY Inject 140mg  into the abdomen or upper thigh once every 14 days. 10/24/23   Early, Sara E, NP  fluticasone  (FLONASE ) 50 MCG/ACT nasal spray  Place 1 spray into both nostrils daily as needed for allergies.    [provider]  hydrocortisone  (ANUSOL -HC) 2.5 % rectal cream PLACE 1 APPLICATION RECTALLY 2 (TWO) TIMES DAILY. 02/03/22   Theophilus Andrews, Tully GRADE, MD  ketoconazole  (NIZORAL ) 2 % cream Apply 1 Application topically daily. Apply to rash twice a day. If no improvement after 2 weeks, please let provider know. 07/23/22   Early, Sara E, NP  loratadine  (CLARITIN ) 10 MG tablet Take 10 mg by mouth daily as needed for allergies.    [provider]  losartan  (COZAAR ) 100 MG tablet Take 1 tablet (100 mg total) by mouth daily. 11/16/23   Early, Sara E, NP  mometasone  (ELOCON ) 0.1 % cream Apply very thin layer to rash at bedtime. May use one other time during the day if needed. Stop after 7 days, if still needed may repeat after 3 day break. 07/23/22   Early, Sara E, NP  polyethylene glycol (MIRALAX  / GLYCOLAX ) 17 g packet Take 17 g by mouth daily. 08/24/23   Akula, Vijaya, MD  predniSONE  (DELTASONE ) 20 MG tablet Take 2 tablets (40 mg total) by mouth daily with breakfast. For gout flair Patient not taking: Reported on 01/27/2024 01/03/24   Early, Camie BRAVO, NP  pregabalin  (LYRICA ) 50 MG capsule Take 1 capsule (50 mg total) by mouth 2 (two) times daily. 01/27/24  Early, Sara E, NP  propranolol  (INDERAL ) 10 MG tablet Take 1 tablet (10 mg total) by mouth daily. 10/05/23   Raulkar, Sven SQUIBB, MD  senna-docusate (SENOKOT-S) 8.6-50 MG tablet Take 2 tablets by mouth 2 (two) times daily. 08/25/23   Love, Sharlet RAMAN, PA-C  tirzepatide  (MOUNJARO ) 5 MG/0.5ML Pen Inject 5 mg into the skin once a week. After 4 weeks increase to 7.5mg  if tolerating well. 02/08/24   Early, Sara E, NP  tirzepatide  (MOUNJARO ) 7.5 MG/0.5ML Pen Inject 7.5 mg into the skin once a week. After 4 weeks increase to 10mg  if tolerating well. 03/07/24   Early, Sara E, NP  tirzepatide  (ZEPBOUND ) 2.5 MG/0.5ML Pen Inject 2.5 mg into the skin once a week. 01/27/24   Early, Sara E, NP   Vitamin D , Ergocalciferol , (DRISDOL ) 1.25 MG (50000 UNIT) CAPS capsule TAKE 1 CAPSULE (50,000 UNITS TOTAL) BY MOUTH EVERY 7 (SEVEN) DAYS 11/17/23   Raulkar, Sven SQUIBB, MD    Allergies: Biaxin [clarithromycin], Ceftin [cefuroxime], Nsaids, Lortab [hydrocodone -acetaminophen ], Percocet [oxycodone -acetaminophen ], Robaxin  [methocarbamol ], Thiazide-type diuretics, Toradol  [ketorolac  tromethamine ], and Ultram  [tramadol ]    Review of Systems  Constitutional:  Negative for fever.  Eyes:  Positive for photophobia, pain, discharge, redness and visual disturbance.  Respiratory:  Negative for shortness of breath.   Cardiovascular:  Negative for chest pain.  Gastrointestinal:  Negative for abdominal pain, diarrhea and vomiting.  Skin:  Negative for rash.  Neurological:  Negative for headaches.    Updated Vital Signs BP (!) 156/75   Pulse 63   Temp 99 F (37.2 C) (Oral)   Resp 16   SpO2 96%   Physical Exam Vitals and nursing note reviewed.  Constitutional:      Appearance: Normal appearance.  HENT:     Head: Normocephalic.     Mouth/Throat:     Mouth: Mucous membranes are moist.  Eyes:     Comments: Significant chemosis of the right eye with clear weeping.  Edema of the upper and lower eyelids with a small amount of redness extending under the eyebrow.  Pain with extraocular movements, pupils sluggish but reactive.  Cardiovascular:     Rate and Rhythm: Normal rate.  Pulmonary:     Effort: Pulmonary effort is normal. No respiratory distress.  Abdominal:     Palpations: Abdomen is soft.     Tenderness: There is no abdominal tenderness.  Skin:    General: Skin is warm.  Neurological:     Mental Status: She is alert and oriented to person, place, and time. Mental status is at baseline.  Psychiatric:        Mood and Affect: Mood normal.     (all labs ordered are listed, but only abnormal results are displayed) Labs Reviewed  BASIC METABOLIC PANEL WITH GFR - Abnormal; Notable for the  following components:      Result Value   BUN 28 (*)    Creatinine, Ser 1.57 (*)    Calcium  10.5 (*)    GFR, Estimated 35 (*)    All other components within normal limits  CBC WITH DIFFERENTIAL/PLATELET    EKG: None  Radiology: No results found.   Procedures   Medications Ordered in the ED  oxyCODONE  (Oxy IR/ROXICODONE ) immediate release tablet 5 mg (has no administration in time range)  tetracaine  (PONTOCAINE) 0.5 % ophthalmic solution 1 drop (1 drop Both Eyes Given by Other 02/13/24 1211)  fluorescein  ophthalmic strip 1 strip (1 strip Both Eyes Given by Other 02/13/24 1212)  iohexol  (OMNIPAQUE ) 300  MG/ML solution 100 mL (75 mLs Intravenous Contrast Given 02/13/24 1507)                                    Medical Decision Making Amount and/or Complexity of Data Reviewed Labs: ordered. Radiology: ordered.  Risk Prescription drug management.   73 year old female presents emergency department with right eye pain and swelling.  Was seen over the weekend by urgent care, diagnosed with conjunctivitis, treated with what sounds like antibiotic and steroid eyedrops.  Comes in today with worsening swelling and pain.  Vitals normal and stable.  She has extensive chemosis of the right eye with eyelid edema, mild redness.  Will plan for CT scan to rule out orbital cellulitis.  Otherwise concern for severe chemosis of the right eye, could be secondary to drops given over the weekend?  Spoke with Dr. Lavonia, on-call ophthalmology.  He agrees with CT to rule out orbital cellulitis but otherwise is able to see her in the office today.  We will plan for this once the CT read has resulted.  Patient signed out pending CT result and hopeful transfer to ophthalmology today.     Final diagnoses:  None    ED Discharge Orders     None          Bari Roxie HERO, DO 02/13/24 1553

## 2024-02-13 NOTE — Telephone Encounter (Signed)
 Thank you for letting me know. The correct brand is Zepbound . I have changed the brand to Zepbound  for both doses.

## 2024-02-13 NOTE — ED Triage Notes (Signed)
 Pt has had increasing right eye swelling since Thursday.  Pt was seen yesterday at [facility] and given eyedrops and dx with conjunctivitis.  Pt states that the pain and swelling has increased since yesterday despite having given herself the dropps q3 hours since yesterday.  Pt reports pain in eye and head.

## 2024-02-13 NOTE — ED Notes (Signed)
 Called Radiology to push images to [facility]

## 2024-02-13 NOTE — ED Provider Notes (Addendum)
 Discussed with Dr Lavonia.  DOes not treat this condition.  Pt will need to be admitted to [facility].  Findings discussed with patient and family.  IV zosyn  and morphine  ordered.  Case discussed with Dr. Felisa [facility].  He will see patient in consultation at their in the emergency department.  Patient can be ED to ED transfer   Randol Simmonds, MD 02/13/24 1816  Patient and husband requested transport by POV.  They understand the risks of worsening pain and vision changes.  Patient's husband will be able to transport her right now.   Randol Simmonds, MD 02/13/24 [phone]

## 2024-02-13 NOTE — ED Notes (Signed)
 Amy Hooper has accepted this patient.  Patient will be going ED to ED POV.  Dr. Chauncey Grills is accepting.

## 2024-02-14 DIAGNOSIS — H30131 Disseminated chorioretinal inflammation, generalized, right eye: Secondary | ICD-10-CM | POA: Insufficient documentation

## 2024-02-21 ENCOUNTER — Telehealth: Payer: Self-pay

## 2024-02-21 NOTE — Telephone Encounter (Signed)
 Copied from CRM #[id number]. Topic: General - Other >> Feb 21, 2024  3:42 PM Joesph B wrote: Reason for CRM: Patient returning a call to speak to Lanette.

## 2024-02-21 NOTE — Transitions of Care (Post Inpatient/ED Visit) (Unsigned)
   02/21/2024  Name: Beyla Cozetta Kottke MRN: [id number] DOB: 1950/09/08  Today's TOC FU Call Status: Today's TOC FU Call Status:: Unsuccessful Call (1st Attempt) Unsuccessful Call (1st Attempt) Date: 02/21/24  Attempted to reach the patient regarding the most recent Inpatient/ED visit.  Follow Up Plan:   Signature Additional outreach attempts will be made to reach the patient to complete the Transitions of Care (Post Inpatient/ED visit) call.  Julian Lemmings, LPN [facility] Nurse Health Advisor Direct Dial [phone]

## 2024-02-22 NOTE — Transitions of Care (Post Inpatient/ED Visit) (Signed)
 02/22/2024  Name: Amy Hooper MRN: [id number] DOB: [birth year]  Today's TOC FU Call Status: Today's TOC FU Call Status:: Successful TOC FU Call Completed Unsuccessful Call (1st Attempt) Date: 02/21/24 [facility] FU Call Complete Date: 02/22/24  Patient's Name and Date of Birth confirmed. Name, DOB  Transition Care Management Follow-up Telephone Call Date of Discharge: 02/20/24 Discharge Facility: Other [facility]) Name of Other (Non[facility]) Discharge Facility: [facility] Type of Discharge: Inpatient Admission Primary Inpatient Discharge Diagnosis:: cellulitis right orbit How have you been since you were released from the hospital?: Better Any questions or concerns?: No  Items Reviewed: Did you receive and understand the discharge instructions provided?: Yes Medications obtained,verified, and reconciled?: Yes (Medications Reviewed) Any new allergies since your discharge?: No Dietary orders reviewed?: Yes Do you have support at home?: Yes People in Home [RPT]: spouse  Medications Reviewed Today: Medications Reviewed Today     Reviewed by Emmitt Pan, LPN (Licensed Practical Nurse) on 02/22/24 at [phone]  Med List Status: <None>   Medication Order Taking? Sig Documenting Provider Last Dose Status Informant  allopurinol  (ZYLOPRIM ) 100 MG tablet [id number] Yes TAKE 2 TABLETS BY MOUTH DAILY Early, Sara E, NP  Active   amLODipine  (NORVASC ) 5 MG tablet [id number] Yes TAKE 1 TABLET BY MOUTH DAILY  (NEED APPT) Early, Camie BRAVO, NP  Active   aspirin  EC 81 MG tablet [id number] Yes Take 1 tablet (81 mg total) by mouth daily. Swallow whole. Akula, Vijaya, MD  Active   buprenorphine  (BUTRANS ) 7.5 MCG/HR [id number] Yes Place 1 patch onto the skin once a week. Debby Fidela CROME, NP  Active   capsaicin  topical system 8 % patch 1 patch [id number]   Raulkar, Sven SQUIBB, MD  Active   cyclobenzaprine  (FLEXERIL ) 5 MG tablet [id number] Yes Take 1 tablet (5 mg total) by mouth at bedtime. Maurice Sharlet RAMAN,  PA-C  Active   dapagliflozin  propanediol (FARXIGA ) 5 MG TABS tablet [id number] Yes TAKE 1 TABLET BY MOUTH DAILY Early, Sara E, NP  Active   dexamethasone  (DECADRON ) 0.1 % ophthalmic solution [id number] Yes Place 1 drop into the right eye every 3 (three) hours. [provider]  Active   dorzolamide-timolol (COSOPT) 2-0.5 % ophthalmic solution [id number] Yes SMARTSIG:In Eye(s) [provider]  Active   DULoxetine  (CYMBALTA ) 20 MG capsule [id number] Yes TAKE 1 CAPSULE BY MOUTH DAILY Raulkar, Sven SQUIBB, MD  Active   eszopiclone  (LUNESTA ) 1 MG TABS tablet [id number] Yes TAKE 1 TABLET BY MOUTH  IMMEDIATELY BEFORE BEDTIME AS  NEEDED FOR SLEEP Early, Sara E, NP  Active   Evolocumab  (REPATHA ) 140 MG/ML SOSY [id number] Yes Inject 140mg  into the abdomen or upper thigh once every 14 days. Early, Sara E, NP  Active   fluticasone  (FLONASE ) 50 MCG/ACT nasal spray [id number] Yes Place 1 spray into both nostrils daily as needed for allergies. [provider]  Active Self           Med Note (SATTERFIELD, TEENA BRAVO   Thu Aug 18, 2023  4:31 PM)    gatifloxacin  (ZYMAXID ) 0.5 % SOLN [id number] Yes Place 1 drop into the right eye 4 (four) times daily. Horton, Kristie M, DO  Active   hydrocortisone  (ANUSOL -HC) 2.5 % rectal cream [id number] Yes PLACE 1 APPLICATION RECTALLY 2 (TWO) TIMES DAILY. Theophilus Andrews, Tully GRADE, MD  Active Self  ketoconazole  (NIZORAL ) 2 % cream [id number] Yes Apply 1 Application topically daily. Apply to rash twice a day. If no improvement after 2 weeks, please let provider know. Oris Camie  E, NP  Active Self  loratadine  (CLARITIN ) 10 MG tablet [id number] Yes Take 10 mg by mouth daily as needed for allergies. [provider]  Active Self  losartan  (COZAAR ) 100 MG tablet [id number] Yes Take 1 tablet (100 mg total) by mouth daily. Early, Sara E, NP  Active   mometasone  (ELOCON ) 0.1 % cream [id number] Yes Apply very thin layer to rash at bedtime. May use one other time during the  day if needed. Stop after 7 days, if still needed may repeat after 3 day break. Early, Sara E, NP  Active Self  olopatadine (PATANOL) 0.1 % ophthalmic solution [id number] Yes Place 1 drop into the right eye 2 (two) times daily. [provider]  Active   polyethylene glycol (MIRALAX  / GLYCOLAX ) 17 g packet [id number] Yes Take 17 g by mouth daily. Akula, Vijaya, MD  Active   predniSONE  (DELTASONE ) 20 MG tablet [id number]  Take 2 tablets (40 mg total) by mouth daily with breakfast. For gout flair  Patient not taking: Reported on 02/22/2024   Early, Sara E, NP  Active   pregabalin  (LYRICA ) 50 MG capsule [id number] Yes Take 1 capsule (50 mg total) by mouth 2 (two) times daily. Early, Sara E, NP  Active   propranolol  (INDERAL ) 10 MG tablet [id number] Yes Take 1 tablet (10 mg total) by mouth daily. Lorilee Sven SQUIBB, MD  Active   senna-docusate (SENOKOT-S) 8.6-50 MG tablet [id number] Yes Take 2 tablets by mouth 2 (two) times daily. Maurice Sharlet RAMAN, PA-C  Active   tirzepatide  (ZEPBOUND ) 2.5 MG/0.5ML Pen [id number] Yes Inject 2.5 mg into the skin once a week. Early, Sara E, NP  Active   tirzepatide  (ZEPBOUND ) 5 MG/0.5ML Pen [id number] Yes Inject 5 mg into the skin once a week. Use for 4 weeks then increase to 7.5mg  dose Early, Sara E, NP  Active   tirzepatide  (ZEPBOUND ) 7.5 MG/0.5ML Pen [id number] Yes Inject 7.5 mg into the skin once a week. Use for 4 weeks then increase to 10mg  dose if tolerating. Early, Sara E, NP  Active   valACYclovir (VALTREX) 1000 MG tablet [id number] Yes Take 1,000 mg by mouth 2 (two) times daily. [provider]  Active   Vitamin D , Ergocalciferol , (DRISDOL ) 1.25 MG (50000 UNIT) CAPS capsule [id number] Yes TAKE 1 CAPSULE (50,000 UNITS TOTAL) BY MOUTH EVERY 7 (SEVEN) DAYS Raulkar, Sven SQUIBB, MD  Active             Home Care and Equipment/Supplies: Were Home Health Services Ordered?: NA Any new equipment or medical supplies ordered?: NA  Functional Questionnaire: Do you  need assistance with bathing/showering or dressing?: No Do you need assistance with meal preparation?: No Do you need assistance with eating?: No Do you have difficulty maintaining continence: No Do you need assistance with getting out of bed/getting out of a chair/moving?: No Do you have difficulty managing or taking your medications?: No  Follow up appointments reviewed: PCP Follow-up appointment confirmed?: Yes Date of PCP follow-up appointment?: 02/27/24 Follow-up Provider: [facility] Follow-up appointment confirmed?: Yes Date of Specialist follow-up appointment?: 03/06/24 Follow-Up Specialty Provider:: Ophal Do you need transportation to your follow-up appointment?: No Do you understand care options if your condition(s) worsen?: Yes-patient verbalized understanding    SIGNATURE Julian Lemmings, LPN [facility] Nurse Health Advisor Direct Dial [phone]

## 2024-02-23 ENCOUNTER — Telehealth: Payer: Self-pay | Admitting: Nurse Practitioner

## 2024-02-23 ENCOUNTER — Other Ambulatory Visit

## 2024-02-23 NOTE — Telephone Encounter (Signed)
 Copied from CRM #[id number]. Topic: Clinical - Medication Prior Auth >> Feb 23, 2024 10:12 AM Kevelyn M wrote: Reason for CRM: John calling with [employer] for a PA for Mounjaro . Norleen is requesting for us  to call patient once PA has been approved.  Call back # [phone]

## 2024-02-25 ENCOUNTER — Ambulatory Visit

## 2024-02-26 ENCOUNTER — Encounter: Payer: Self-pay | Admitting: Orthopaedic Surgery

## 2024-02-27 ENCOUNTER — Ambulatory Visit: Admitting: Family Medicine

## 2024-02-27 ENCOUNTER — Encounter: Payer: Self-pay | Admitting: Nurse Practitioner

## 2024-02-27 VITALS — BP 122/78 | HR 66 | Wt 208.8 lb

## 2024-02-27 DIAGNOSIS — Z8673 Personal history of transient ischemic attack (TIA), and cerebral infarction without residual deficits: Secondary | ICD-10-CM | POA: Diagnosis not present

## 2024-02-27 DIAGNOSIS — R1013 Epigastric pain: Secondary | ICD-10-CM | POA: Insufficient documentation

## 2024-02-27 DIAGNOSIS — E669 Obesity, unspecified: Secondary | ICD-10-CM | POA: Diagnosis not present

## 2024-02-27 DIAGNOSIS — F321 Major depressive disorder, single episode, moderate: Secondary | ICD-10-CM | POA: Insufficient documentation

## 2024-02-27 DIAGNOSIS — G4733 Obstructive sleep apnea (adult) (pediatric): Secondary | ICD-10-CM | POA: Diagnosis not present

## 2024-02-27 DIAGNOSIS — N1831 Chronic kidney disease, stage 3a: Secondary | ICD-10-CM | POA: Diagnosis not present

## 2024-02-27 DIAGNOSIS — H30131 Disseminated chorioretinal inflammation, generalized, right eye: Secondary | ICD-10-CM

## 2024-02-27 DIAGNOSIS — E782 Mixed hyperlipidemia: Secondary | ICD-10-CM

## 2024-02-27 LAB — CUP PACEART REMOTE DEVICE CHECK
Date Time Interrogation Session: 20251205233023
Implantable Pulse Generator Implant Date: 20250603

## 2024-02-27 MED ORDER — ZEPBOUND 5 MG/0.5ML ~~LOC~~ SOAJ: 5.0000 mg | SUBCUTANEOUS | 0 refills | Status: AC

## 2024-02-27 MED ORDER — ZEPBOUND 7.5 MG/0.5ML ~~LOC~~ SOAJ: 7.5000 mg | SUBCUTANEOUS | 0 refills | Status: AC

## 2024-02-27 MED ORDER — ZEPBOUND 2.5 MG/0.5ML ~~LOC~~ SOAJ: 2.5000 mg | SUBCUTANEOUS | 1 refills | Status: AC

## 2024-02-27 NOTE — Patient Instructions (Addendum)
 I would like you to start taking the omeprazole to help with the pain in the left side of your stomach. This medication can help with any side effects of the Valacyclovir.  Give this a couple of days, but if it is not getting better send us  a message and let me know and I will send in the order for an ultrasound of the area. I am hopeful the medication will do the trick.   If your other eye is feeling ANY worse in the coming days, please contact the eye doctor and let them know. If there is something going on with the other eye, we don't want it to go out of control.    GERD in Adults: What to Know  Gastroesophageal reflux (GER) is when acid from your stomach flows up into your esophagus. Your esophagus is the part of your body that moves food from your mouth to your stomach. Normally, food goes down and stays in your stomach to be digested. But with GER, food and stomach acid may go back up. You may have a disease called gastroesophageal reflux disease (GERD) if the reflux: Happens often. Causes very bad symptoms. Makes your esophagus sore and swollen. Over time, GERD can make small holes called ulcers in the lining of your esophagus. What are the causes? GERD is caused by a problem with the muscle between your esophagus and stomach. This muscle is called the lower esophageal sphincter (LES). When it's weak or not normal, it doesn't close like it should. This means food and stomach acid can go back up into your esophagus. The muscle can be weak if: You smoke or use products with tobacco in them. You're pregnant. You have a type of hernia called a hiatal hernia. You eat certain foods and drinks. These include: Alcohol. Coffee. Chocolate. Onions. Peppermint. What increases the risk? Being overweight. Having a disease that affects your connective tissue. Taking NSAIDs, such as ibuprofen. What are the signs or symptoms? Heartburn. Trouble swallowing. Pain when you swallow. The feeling  of having a lump in your throat. A bitter taste in your mouth. Bad breath. Having an upset or bloated stomach. Burping. Chest pain. Other conditions can also cause chest pain. Make sure you see your health care provider if you have chest pain. Wheezing. This is when you make high-pitched whistling sounds when you breathe, most often when you breathe out. A long-term cough or a cough at night. How is this diagnosed? GERD may be diagnosed based on your medical history and a physical exam. You may also have tests. These may include: An endoscopy. This test looks at your stomach and esophagus with a small camera. A barium swallow test. This shows the shape and size of your esophagus and how well it's working. Tests of your esophagus to check for: Acid levels. Pressure. How is this treated? Treatment may depend on how bad your symptoms are. It may include: Changes to your diet and daily life. Medicines. Surgery. Follow these instructions at home: Eating and drinking Follow an eating plan as told by your provider. You may need to avoid certain foods and drinks. These may include: Coffee and tea, with or without caffeine . Alcohol. Energy drinks and sports drinks. Fizzy drinks or sodas. Chocolate and cocoa. Peppermint and mint flavorings. Garlic and onions. Horseradish. Spicy and acidic foods. These include: Peppers. Chili powder and curry powder. Vinegar. Hot sauces and BBQ sauce. Citrus fruits and juices. These include: Oranges. Lemons. Limes. Tomato-based foods. These include: Red  sauce and pizza with red sauce. Chili. Salsa. Fried and fatty foods. These include: Donuts. French fries. Potato chips. High-fat dressings. High-fat meats. These include: Hot dogs and sausage. Rib eye steak. Ham and bacon. High-fat dairy items. These include: Whole milk. Butter. Cream cheese. Eat small meals often. Avoid eating big meals. Avoid drinking lots of liquid with your  meals. Try not to eat meals during the 2-3 hours before bedtime. Try not to lie down right after you eat. Do not exercise right after you eat. Lifestyle  If you're overweight, lose an amount of weight that's healthy for you. Ask your provider about a safe weight loss goal. Do not smoke, vape, or use nicotine or tobacco. Wear loose clothes. Do not wear things that are tight around your waist. When you sleep, try: Raising the head of your bed about 6 inches (15 cm). You can use a wedge to do this. Lying down on your left side. Try to lower your stress. If you need help doing this, ask your provider. General instructions Take your medicines only as told. Do not take aspirin  or ibuprofen unless you're told to. Watch for any changes in your symptoms. Do not bend over if it makes your symptoms worse. Contact a health care provider if: You have new symptoms. You have trouble: Drinking. Swallowing. Eating. It hurts to swallow. You have wheezing. You have a cough that won't go away. Your voice is hoarse. Your symptoms don't get better with treatment. Get help right away if: You have pain all of a sudden in your: Arm. Neck. Jaw. Teeth. Back. You feel sweaty, dizzy, or light-headed all of a sudden. You faint. You have chest pain or shortness of breath. You vomit and the vomit is: Green, yellow, or black. Looks like blood or coffee grounds. Your poop is red, bloody, or black. These symptoms may be an emergency. Call 911 right away. Do not wait to see if the symptoms will go away. Do not drive yourself to the hospital. This information is not intended to replace advice given to you by your health care provider. Make sure you discuss any questions you have with your health care provider. Document Revised: 01/18/2023 Document Reviewed: 08/04/2022 Elsevier Patient Education  2024 [employer].

## 2024-02-27 NOTE — Assessment & Plan Note (Signed)
 Improvement in right eye symptoms with antiviral treatment. Negative varicella zoster virus tests, but clinical response and appearance consistent with acute retinal necrosis. Concerns about potential retinal detachment due to history of retinal issues. - Continue Valtrex as prescribed. - Monitor for signs of retinal detachment, such as a curtain-like shadow over the eye, and seek immediate medical attention if these occur. - Follow up with eye specialist on Monday.

## 2024-02-27 NOTE — Progress Notes (Signed)
 Camie FORBES Doing, DNP, AGNP-c [facility] [address] [place], [place] [address] [phone]   ACUTE VISIT on 02/27/2024  Blood pressure 122/78, pulse 66, weight 208 lb 12.8 oz (94.7 kg), SpO2 96%.  Subjective:  HPI  Hospital visit 02/13/2024 and 02/15/2024 for Herpes Zoster of the right eye.   History of Present Illness Amy Hooper is a 73 year old female who presents for hospital follow-up for shingles in the right eye.  She has been experiencing improvement in her right eye condition after treatment for shingles. She is under the care of an ophthalmologist and has a follow-up appointment scheduled. Despite treatment, she now experiences pain in her left eye, although her vision remains unaffected. She is currently on Valtrex and has been responding to the antiviral treatment, which included IV antivirals and eye drops. A specimen was taken from her eye, and antiviral medication was applied directly. Despite negative test results for shingles, her eye has shown significant improvement.  She reports waking up with stabbing pain in her rib cage, particularly on the left side, which makes it difficult to take a deep breath. She denies any recent falls or injuries but mentions lifting a bucket of water  the previous day. No cough, cold, or flu symptoms, and her bowel movements are normal. She is concerned about the possibility of mononucleosis, as she has experienced it before, but denies current symptoms of sore throat or extreme fatigue.  Her past medical history includes a previous diagnosis of an autoimmune condition, possibly rheumatoid arthritis. She has been tested for various conditions, including cytomegalovirus and herpes simplex, which were negative. Her sedimentation rate and C-reactive protein were elevated. She has been on a potent dose of valacyclovir since December 1st and is supposed to continue for 28 days. She was also prescribed omeprazole in  the hospital but has not been taking it since discharge.  ROS negative except for what is listed in HPI. History, Medications, Surgery, SDOH, and Family History reviewed and updated as appropriate.  Objective:  Physical Exam Constitutional:      General: She is not in acute distress.    Appearance: Normal appearance. She is not ill-appearing, toxic-appearing or diaphoretic.  Cardiovascular:     Rate and Rhythm: Normal rate and regular rhythm.     Heart sounds: Murmur heard.     No friction rub.  Pulmonary:     Breath sounds: Normal breath sounds.     Comments: Shallow breathing. Abdominal:     General: Abdomen is flat. There is no distension.     Palpations: Abdomen is soft. There is no mass.     Tenderness: There is abdominal tenderness. There is guarding. There is no right CVA tenderness, left CVA tenderness or rebound.     Hernia: No hernia is present.     Comments: Epigastric and LUQ tenderness present with palpation.   Musculoskeletal:     Cervical back: Neck supple.  Lymphadenopathy:     Cervical: No cervical adenopathy.  Skin:    General: Skin is warm and dry.     Capillary Refill: Capillary refill takes less than 2 seconds.  Neurological:     Mental Status: She is alert and oriented to person, place, and time.     Sensory: No sensory deficit.     Motor: No weakness.  Psychiatric:        Mood and Affect: Mood normal.         Assessment & Plan:   Problem List  Items Addressed This Visit     Acute retinal necrosis of right eye - Primary   Improvement in right eye symptoms with antiviral treatment. Negative varicella zoster virus tests, but clinical response and appearance consistent with acute retinal necrosis. Concerns about potential retinal detachment due to history of retinal issues. - Continue Valtrex as prescribed. - Monitor for signs of retinal detachment, such as a curtain-like shadow over the eye, and seek immediate medical attention if these occur. -  Follow up with eye specialist on Monday.      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 Pain exacerbated by deep breathing and tenderness on palpation. Negative mono test and CMV. Elevated inflammatory markers noted. - Start omeprazole to manage potential gastritis. - Monitor symptoms and report if no improvement in a few days. - Will consider ultrasound if symptoms persist.      Hyperlipidemia   Relevant Medications   tirzepatide  (ZEPBOUND ) 5 MG/0.5ML Pen (Start on 03/26/2024)   tirzepatide  (ZEPBOUND ) 7.5 MG/0.5ML Pen (Start on 04/23/2024)   CKD (chronic kidney disease) stage 3, GFR 30-59 ml/min (HCC)   Relevant Medications   tirzepatide  (ZEPBOUND ) 5 MG/0.5ML Pen (Start on 03/26/2024)   tirzepatide  (ZEPBOUND ) 7.5 MG/0.5ML Pen (Start on 04/23/2024)   tirzepatide  (ZEPBOUND ) 2.5 MG/0.5ML Pen   OSA (obstructive sleep apnea)   Relevant Medications   tirzepatide  (ZEPBOUND ) 5 MG/0.5ML Pen (Start on 03/26/2024)   tirzepatide  (ZEPBOUND ) 7.5 MG/0.5ML Pen (Start on 04/23/2024)   tirzepatide  (ZEPBOUND ) 2.5 MG/0.5ML Pen   Obesity (BMI 30-39.9)   Relevant Medications   tirzepatide  (ZEPBOUND ) 5 MG/0.5ML Pen (Start on 03/26/2024)   tirzepatide  (ZEPBOUND ) 7.5 MG/0.5ML Pen (Start on 04/23/2024)   tirzepatide  (ZEPBOUND ) 2.5 MG/0.5ML Pen   History of stroke   Relevant Medications   tirzepatide  (ZEPBOUND ) 5 MG/0.5ML Pen (Start on 03/26/2024)   tirzepatide  (ZEPBOUND ) 7.5 MG/0.5ML Pen (Start on 04/23/2024)    Camie FORBES Doing, DNP, AGNP-c Time: 45 minutes, >50% spent counseling, care coordination, chart review, and documentation.

## 2024-02-27 NOTE — Assessment & Plan Note (Signed)
 No concerns present at this time. Managed well with current regimen. Will continue to monitor.

## 2024-02-27 NOTE — Assessment & Plan Note (Signed)
 New onset upper left abdominal/rib pain, likely due to valacyclovir-induced gastritis. Pain exacerbated by deep breathing and tenderness on palpation. Negative mono test and CMV. Elevated inflammatory markers noted. - Start omeprazole to manage potential gastritis. - Monitor symptoms and report if no improvement in a few days. - Will consider ultrasound if symptoms persist.

## 2024-02-28 ENCOUNTER — Ambulatory Visit
Admission: RE | Admit: 2024-02-28 | Discharge: 2024-02-28 | Disposition: A | Source: Ambulatory Visit | Attending: Nurse Practitioner

## 2024-02-28 ENCOUNTER — Other Ambulatory Visit: Payer: Self-pay | Admitting: Nurse Practitioner

## 2024-02-28 ENCOUNTER — Encounter: Payer: Self-pay | Admitting: Nurse Practitioner

## 2024-02-28 DIAGNOSIS — R0789 Other chest pain: Secondary | ICD-10-CM

## 2024-02-29 ENCOUNTER — Ambulatory Visit: Payer: Self-pay | Admitting: Nurse Practitioner

## 2024-02-29 NOTE — Progress Notes (Signed)
 Remote Loop Recorder Transmission

## 2024-02-29 NOTE — Patient Instructions (Addendum)
 SURGICAL WAITING ROOM VISITATION Patients having surgery or a procedure may have no more than 2 support people in the waiting area - these visitors may rotate.    Children under the age of 56 will not be allowed to visit due to the increase in respiratory illness  Children under the age of 19 must have an adult with them who is not the patient.  If the patient needs to stay at the hospital during part of their recovery, the visitor guidelines for inpatient rooms apply. Pre-op nurse will coordinate an appropriate time for 1 support person to accompany patient in pre-op.  This support person may not rotate.    Please refer to the [facility] website for the visitor guidelines for Inpatients (after your surgery is over and you are in a regular room).       Your procedure is scheduled on: 03-16-24   Report to [facility] Main Entrance    Report to admitting at 5:15 AM   Call this number if you have problems the morning of surgery [phone]   Do not eat food :After Midnight.   After Midnight you may have the following liquids until 4:30 AM DAY OF SURGERY  Water  Non-Citrus Juices (without pulp, NO RED-Apple, White grape, White cranberry) Black Coffee (NO MILK/CREAM OR CREAMERS, sugar ok)  Clear Tea (NO MILK/CREAM OR CREAMERS, sugar ok) regular and decaf                             Plain Jell-O (NO RED)                                           Fruit ices (not with fruit pulp, NO RED)                                     Popsicles (NO RED)                                                               Sports drinks like Gatorade (NO RED)                   The day of surgery:  Drink ONE (1) Pre-Surgery Clear Ensure or G2 by 4:30 AM the morning of surgery. Drink in one sitting. Do not sip.  This drink was given to you during your hospital  pre-op appointment visit. Nothing else to drink after completing the Pre-Surgery Clear Ensure or G2.          If you have questions,  please contact your surgeons office.   FOLLOW  ANY ADDITIONAL PRE OP INSTRUCTIONS YOU RECEIVED FROM YOUR SURGEON'S OFFICE!!!     Oral Hygiene is also important to reduce your risk of infection.                                    Remember - BRUSH YOUR TEETH THE MORNING OF SURGERY WITH YOUR REGULAR TOOTHPASTE   Do NOT  smoke after Midnight   Take these medicines the morning of surgery with A SIP OF WATER :    Allopurinol    Amlodipine    Claritin    Duloxetine    Pregabalin    Propranolol    Valacyclovir   Okay to use eyedrops and nasal spary  Stop all vitamins and herbal supplements 7 days before surgery  Hold Zepbound  7 days before surgery (do not take after 03-08-24)  Bring CPAP mask and tubing day of surgery.                              You may not have any metal on your body including hair pins, jewelry, and body piercing             Do not wear make-up, lotions, powders, perfumes, or deodorant  Do not wear nail polish including gel and S&S, artificial/acrylic nails, or any other type of covering on natural nails including finger and toenails. If you have artificial nails, gel coating, etc. that needs to be removed by a nail salon please have this removed prior to surgery or surgery may need to be canceled/ delayed if the surgeon/ anesthesia feels like they are unable to be safely monitored.   Do not shave  48 hours prior to surgery.    Do not bring valuables to the hospital. [facility] IS NOT RESPONSIBLE   FOR VALUABLES.   Contacts, dentures or bridgework may not be worn into surgery.   Bring small overnight bag day of surgery.   DO NOT BRING YOUR HOME MEDICATIONS TO THE HOSPITAL. PHARMACY WILL DISPENSE MEDICATIONS LISTED ON YOUR MEDICATION LIST TO YOU DURING YOUR ADMISSION IN THE HOSPITAL!     Special Instructions: Bring a copy of your healthcare power of attorney and living will documents the day of surgery if you haven't scanned them before.              Please read  over the following fact sheets you were given: IF YOU HAVE QUESTIONS ABOUT YOUR PRE-OP INSTRUCTIONS PLEASE CALL [phone] Gwen  If you received a COVID test during your pre-op visit  it is requested that you wear a mask when out in public, stay away from anyone that may not be feeling well and notify your surgeon if you develop symptoms. If you test positive for Covid or have been in contact with anyone that has tested positive in the last 10 days please notify you surgeon.   Pre-operative 4 CHG Bath Instructions  DYNA-Hex 4 Chlorhexidine  Gluconate 4% Solution Antiseptic 4 fl. oz   You can play a key role in reducing the risk of infection after surgery. Your skin needs to be as free of germs as possible. You can reduce the number of germs on your skin by washing with CHG (chlorhexidine  gluconate) soap before surgery. CHG is an antiseptic soap that kills germs and continues to kill germs even after washing.   DO NOT use if you have an allergy to chlorhexidine /CHG or antibacterial soaps. If your skin becomes reddened or irritated, stop using the CHG and notify one of our RNs at   Please shower with the CHG soap starting 4 days before surgery using the following schedule:     Please keep in mind the following:  DO NOT shave, including legs and underarms, starting the day of your first shower.   You may shave your face at any point before/day of surgery.  Place clean sheets  on your bed the day you start using CHG soap. Use a clean washcloth (not used since being washed) for each shower. DO NOT sleep with pets once you start using the CHG.  CHG Shower Instructions:  If you choose to wash your hair and private area, wash first with your normal shampoo/soap.  After you use shampoo/soap, rinse your hair and body thoroughly to remove shampoo/soap residue.  Turn the water  OFF and apply about 3 tablespoons (45 ml) of CHG soap to a CLEAN washcloth.  Apply CHG soap ONLY FROM YOUR NECK DOWN TO YOUR  TOES (washing for 3-5 minutes)  DO NOT use CHG soap on face, private areas, open wounds, or sores.  Pay special attention to the area where your surgery is being performed.  If you are having back surgery, having someone wash your back for you may be helpful. Wait 2 minutes after CHG soap is applied, then you may rinse off the CHG soap.  Pat dry with a clean towel  Put on clean clothes/pajamas   If you choose to wear lotion, please use ONLY the CHG-compatible lotions on the back of this paper.     Additional instructions for the day of surgery:  Shower with regular soap the day of surgery DO NOT APPLY any lotions, deodorants, cologne, or perfumes.   Put on clean/comfortable clothes.  Brush your teeth.  Ask your nurse before applying any prescription medications to the skin.   CHG Compatible Lotions   Aveeno Moisturizing lotion  Cetaphil Moisturizing Cream  Cetaphil Moisturizing Lotion  Clairol Herbal Essence Moisturizing Lotion, Dry Skin  Clairol Herbal Essence Moisturizing Lotion, Extra Dry Skin  Clairol Herbal Essence Moisturizing Lotion, Normal Skin  Curel Age Defying Therapeutic Moisturizing Lotion with Alpha Hydroxy  Curel Extreme Care Body Lotion  Curel Soothing Hands Moisturizing Hand Lotion  Curel Therapeutic Moisturizing Cream, Fragrance-Free  Curel Therapeutic Moisturizing Lotion, Fragrance-Free  Curel Therapeutic Moisturizing Lotion, Original Formula  Eucerin Daily Replenishing Lotion  Eucerin Dry Skin Therapy Plus Alpha Hydroxy Crme  Eucerin Dry Skin Therapy Plus Alpha Hydroxy Lotion  Eucerin Original Crme  Eucerin Original Lotion  Eucerin Plus Crme Eucerin Plus Lotion  Eucerin TriLipid Replenishing Lotion  Keri Anti-Bacterial Hand Lotion  Keri Deep Conditioning Original Lotion Dry Skin Formula Softly Scented  Keri Deep Conditioning Original Lotion, Fragrance Free Sensitive Skin Formula  Keri Lotion Fast Absorbing Fragrance Free Sensitive Skin Formula  Keri  Lotion Fast Absorbing Softly Scented Dry Skin Formula  Keri Original Lotion  Keri Skin Renewal Lotion Keri Silky Smooth Lotion  Keri Silky Smooth Sensitive Skin Lotion  Nivea Body Creamy Conditioning Oil  Nivea Body Extra Enriched Lotion  Nivea Body Original Lotion  Nivea Body Sheer Moisturizing Lotion Nivea Crme  Nivea Skin Firming Lotion  NutraDerm 30 Skin Lotion  NutraDerm Skin Lotion  NutraDerm Therapeutic Skin Cream  NutraDerm Therapeutic Skin Lotion  ProShield Protective Hand Cream  Provon moisturizing lotion   PATIENT SIGNATURE_________________________________  NURSE SIGNATURE__________________________________  ________________________________________________________________________    Nasario Exon  An incentive spirometer is a tool that can help keep your lungs clear and active. This tool measures how well you are filling your lungs with each breath. Taking long deep breaths may help reverse or decrease the chance of developing breathing (pulmonary) problems (especially infection) following: A long period of time when you are unable to move or be active. BEFORE THE PROCEDURE  If the spirometer includes an indicator to show your best effort, your nurse or respiratory therapist will set  it to a desired goal. If possible, sit up straight or lean slightly forward. Try not to slouch. Hold the incentive spirometer in an upright position. INSTRUCTIONS FOR USE  Sit on the edge of your bed if possible, or sit up as far as you can in bed or on a chair. Hold the incentive spirometer in an upright position. Breathe out normally. Place the mouthpiece in your mouth and seal your lips tightly around it. Breathe in slowly and as deeply as possible, raising the piston or the ball toward the top of the column. Hold your breath for 3-5 seconds or for as long as possible. Allow the piston or ball to fall to the bottom of the column. Remove the mouthpiece from your mouth and  breathe out normally. Rest for a few seconds and repeat Steps 1 through 7 at least 10 times every 1-2 hours when you are awake. Take your time and take a few normal breaths between deep breaths. The spirometer may include an indicator to show your best effort. Use the indicator as a goal to work toward during each repetition. After each set of 10 deep breaths, practice coughing to be sure your lungs are clear. If you have an incision (the cut made at the time of surgery), support your incision when coughing by placing a pillow or rolled up towels firmly against it. Once you are able to get out of bed, walk around indoors and cough well. You may stop using the incentive spirometer when instructed by your caregiver.  RISKS AND COMPLICATIONS Take your time so you do not get dizzy or light-headed. If you are in pain, you may need to take or ask for pain medication before doing incentive spirometry. It is harder to take a deep breath if you are having pain. AFTER USE Rest and breathe slowly and easily. It can be helpful to keep track of a log of your progress. Your caregiver can provide you with a simple table to help with this. If you are using the spirometer at home, follow these instructions: SEEK MEDICAL CARE IF:  You are having difficultly using the spirometer. You have trouble using the spirometer as often as instructed. Your pain medication is not giving enough relief while using the spirometer. You develop fever of 100.5 F (38.1 C) or higher. SEEK IMMEDIATE MEDICAL CARE IF:  You cough up bloody sputum that had not been present before. You develop fever of 102 F (38.9 C) or greater. You develop worsening pain at or near the incision site. MAKE SURE YOU:  Understand these instructions. Will watch your condition. Will get help right away if you are not doing well or get worse. Document Released: 07/19/2006 Document Revised: 05/31/2011 Document Reviewed: 09/19/2006 [facility] Patient  Information 2014 [place], [place].

## 2024-03-01 ENCOUNTER — Encounter: Payer: Self-pay | Admitting: Orthopaedic Surgery

## 2024-03-02 NOTE — Telephone Encounter (Signed)
 We can try to put someone else on in her place.  I'm sure we have some people who are eager to do surgery before end of year.  Thanks.

## 2024-03-02 NOTE — Progress Notes (Signed)
 Date of COVID positive in last 90 days:  PCP - Camie Doing, NP Cardiologist - Stanly Knock, MD Neurologist - Greig Forbes, NP  Chest x-ray - 02-14-24 Epic EKG - 08-19-23 Epic Stress Test - N/A ECHO - 08-19-23 Epic Cardiac Cath - N/A Long Term Monitor - 2024 Epic Pacemaker/ICD device last checked:N/A Spinal Cord Stimulator:N/A Loop Recorder  Bowel Prep - N/A  Sleep Study - Yes, +sleep apnea CPAP -   Fasting Blood Sugar - N/A Checks Blood Sugar _____ times a day  Zepbound  Last dose of GLP1 agonist-  N/A GLP1 instructions:  Do not take after 03-08-24    Farxiga  for CKD Last dose of SGLT-2 inhibitors-  N/A SGLT-2 instructions:  Do not take after   Blood Thinner Instructions: N/A Last dose:   Time: Aspirin  Instructions:N/A Last Dose:  Activity level:  Can go up a flight of stairs and perform activities of daily living without stopping and without symptoms of chest pain or shortness of breath.  Able to exercise without symptoms  Unable to go up a flight of stairs without symptoms of     Anesthesia review: Palpitations evaluated by cardiology, hx of stroke, HTN, OSA, systolic murmur, CKD  Patient denies shortness of breath, fever, cough and chest pain at PAT appointment  Patient verbalized understanding of instructions that were given to them at the PAT appointment. Patient was also instructed that they will need to review over the PAT instructions again at home before surgery.

## 2024-03-04 ENCOUNTER — Encounter (HOSPITAL_BASED_OUTPATIENT_CLINIC_OR_DEPARTMENT_OTHER): Payer: Self-pay | Admitting: Emergency Medicine

## 2024-03-04 ENCOUNTER — Emergency Department (HOSPITAL_BASED_OUTPATIENT_CLINIC_OR_DEPARTMENT_OTHER)
Admission: EM | Admit: 2024-03-04 | Discharge: 2024-03-04 | Disposition: A | Discharge status: Other Acute Inpatient Hospital | Source: Home / Self Care | Attending: Emergency Medicine | Admitting: Emergency Medicine

## 2024-03-04 ENCOUNTER — Emergency Department (HOSPITAL_BASED_OUTPATIENT_CLINIC_OR_DEPARTMENT_OTHER)

## 2024-03-04 DIAGNOSIS — H5462 Unqualified visual loss, left eye, normal vision right eye: Secondary | ICD-10-CM

## 2024-03-04 DIAGNOSIS — H40052 Ocular hypertension, left eye: Secondary | ICD-10-CM

## 2024-03-04 DIAGNOSIS — H5710 Ocular pain, unspecified eye: Secondary | ICD-10-CM | POA: Diagnosis not present

## 2024-03-04 DIAGNOSIS — Z743 Need for continuous supervision: Secondary | ICD-10-CM | POA: Diagnosis not present

## 2024-03-04 DIAGNOSIS — H05012 Cellulitis of left orbit: Secondary | ICD-10-CM

## 2024-03-04 LAB — COMPREHENSIVE METABOLIC PANEL WITH GFR
ALT: 46 U/L — ABNORMAL HIGH (ref 0–44)
AST: 27 U/L (ref 15–41)
Albumin: 3.8 g/dL (ref 3.5–5.0)
Alkaline Phosphatase: 257 U/L — ABNORMAL HIGH (ref 38–126)
Anion gap: 13 (ref 5–15)
BUN: 16 mg/dL (ref 8–23)
CO2: 20 mmol/L — ABNORMAL LOW (ref 22–32)
Calcium: 10.3 mg/dL (ref 8.9–10.3)
Chloride: 107 mmol/L (ref 98–111)
Creatinine, Ser: 1.56 mg/dL — ABNORMAL HIGH (ref 0.44–1.00)
GFR, Estimated: 35 mL/min — ABNORMAL LOW (ref >60)
Glucose, Bld: 115 mg/dL — ABNORMAL HIGH (ref 70–99)
Potassium: 4.4 mmol/L (ref 3.5–5.1)
Sodium: 141 mmol/L (ref 135–145)
Total Bilirubin: 0.6 mg/dL (ref 0.0–1.2)
Total Protein: 8.2 g/dL — ABNORMAL HIGH (ref 6.5–8.1)

## 2024-03-04 LAB — CBC WITH DIFFERENTIAL/PLATELET
Abs Immature Granulocytes: 0.03 K/uL (ref 0.00–0.07)
Basophils Absolute: 0 K/uL (ref 0.0–0.1)
Basophils Relative: 0 %
Eosinophils Absolute: 0.1 K/uL (ref 0.0–0.5)
Eosinophils Relative: 1 %
HCT: 34.8 % — ABNORMAL LOW (ref 36.0–46.0)
Hemoglobin: 11.8 g/dL — ABNORMAL LOW (ref 12.0–15.0)
Immature Granulocytes: 0 %
Lymphocytes Relative: 23 %
Lymphs Abs: 2.1 K/uL (ref 0.7–4.0)
MCH: 29.9 pg (ref 26.0–34.0)
MCHC: 33.9 g/dL (ref 30.0–36.0)
MCV: 88.3 fL (ref 80.0–100.0)
Monocytes Absolute: 0.8 K/uL (ref 0.1–1.0)
Monocytes Relative: 9 %
Neutro Abs: 6 K/uL (ref 1.7–7.7)
Neutrophils Relative %: 67 %
Platelets: 456 K/uL — ABNORMAL HIGH (ref 150–400)
RBC: 3.94 MIL/uL (ref 3.87–5.11)
RDW: 14.1 % (ref 11.5–15.5)
WBC: 9.1 K/uL (ref 4.0–10.5)
nRBC: 0 % (ref 0.0–0.2)

## 2024-03-04 MED ORDER — PIPERACILLIN-TAZOBACTAM 3.375 G IVPB 30 MIN
3.3750 g | Freq: Once | INTRAVENOUS | Status: AC
  Administered 2024-03-04: 3.375 g via INTRAVENOUS
  Filled 2024-03-04: qty 50

## 2024-03-04 MED ORDER — IOHEXOL 300 MG/ML  SOLN
75.0000 mL | Freq: Once | INTRAMUSCULAR | Status: AC | PRN
  Administered 2024-03-04: 75 mL via INTRAVENOUS

## 2024-03-04 MED ORDER — TETRACAINE HCL 0.5 % OP SOLN
2.0000 [drp] | Freq: Once | OPHTHALMIC | Status: AC
  Administered 2024-03-04: 2 [drp] via OPHTHALMIC
  Filled 2024-03-04: qty 4

## 2024-03-04 MED ORDER — SODIUM CHLORIDE 0.9 % IV SOLN: INTRAVENOUS | Status: DC

## 2024-03-04 MED ORDER — LATANOPROST 0.005 % OP SOLN
1.0000 [drp] | OPHTHALMIC | Status: AC
  Filled 2024-03-04: qty 2.5

## 2024-03-04 MED ORDER — VANCOMYCIN HCL IN DEXTROSE 1-5 GM/200ML-% IV SOLN
1000.0000 mg | Freq: Once | INTRAVENOUS | Status: AC
  Administered 2024-03-04: 1000 mg via INTRAVENOUS
  Filled 2024-03-04: qty 200

## 2024-03-04 MED ORDER — ONDANSETRON HCL 4 MG/2ML IJ SOLN
4.0000 mg | Freq: Once | INTRAMUSCULAR | Status: AC
  Administered 2024-03-04: 4 mg via INTRAVENOUS
  Filled 2024-03-04: qty 2

## 2024-03-04 MED ORDER — CLINDAMYCIN PHOSPHATE 600 MG/50ML IV SOLN: 600.0000 mg | Freq: Once | INTRAVENOUS | Status: DC

## 2024-03-04 MED ORDER — TIMOLOL MALEATE 0.5 % OP SOLN
1.0000 [drp] | OPHTHALMIC | Status: AC
  Administered 2024-03-04 (×3): 1 [drp] via OPHTHALMIC
  Filled 2024-03-04: qty 5

## 2024-03-04 MED ORDER — MORPHINE SULFATE (PF) 4 MG/ML IV SOLN
4.0000 mg | Freq: Once | INTRAVENOUS | Status: AC
  Administered 2024-03-04: 4 mg via INTRAVENOUS
  Filled 2024-03-04: qty 1

## 2024-03-04 MED ORDER — FENTANYL CITRATE (PF) 50 MCG/ML IJ SOSY
50.0000 ug | PREFILLED_SYRINGE | Freq: Once | INTRAMUSCULAR | Status: AC
  Administered 2024-03-04: 50 ug via INTRAVENOUS
  Filled 2024-03-04: qty 1

## 2024-03-04 MED ORDER — SODIUM CHLORIDE 0.9 % IV BOLUS
1000.0000 mL | Freq: Once | INTRAVENOUS | Status: AC
  Administered 2024-03-04: 1000 mL via INTRAVENOUS

## 2024-03-04 MED ORDER — DEXTROSE 5 % IV SOLN
10.0000 mg/kg | Freq: Two times a day (BID) | INTRAVENOUS | Status: DC
  Administered 2024-03-04: 22:00:00 680 mg via INTRAVENOUS
  Filled 2024-03-04: qty 13.6

## 2024-03-04 MED ORDER — BRIMONIDINE TARTRATE 0.15 % OP SOLN
1.0000 [drp] | OPHTHALMIC | Status: AC
  Filled 2024-03-04: qty 5

## 2024-03-04 NOTE — ED Provider Notes (Signed)
 [place] EMERGENCY DEPARTMENT AT [facility] Provider Note   CSN: [id number] Arrival date & time: 03/04/24  1629     Patient presents with: Facial Swelling   Amy Hooper is a 73 y.o. female dents for evaluation of left eye pain.  Patient reports that she has had recent right eye shingles but that has resolved occasionally has some sharp pain on the right.  Patient started having some pain in her left eye yesterday with a little bit of swelling in the lower lid.  She was seen by her ophthalmologist yesterday and told that she had dry eye in the left eye was given liquid tears.  Her eye exam at that time showed 20/40 in the right eye, 2030 in the left eye with distance.  She had normal pressures in both eyes since that time she has had progressively worsening pain and severe swelling in the left conjunctiva and lid.  She denies changes in vision but is having some photophobia and tearing.   HPI     Prior to Admission medications  Medication Sig Start Date End Date Taking? Authorizing Provider  allopurinol  (ZYLOPRIM ) 100 MG tablet TAKE 2 TABLETS BY MOUTH DAILY 02/06/24   Early, Sara E, NP  amLODipine  (NORVASC ) 5 MG tablet TAKE 1 TABLET BY MOUTH DAILY  (NEED APPT) 02/06/24   Early, Camie BRAVO, NP  aspirin  EC 81 MG tablet Take 1 tablet (81 mg total) by mouth daily. Swallow whole. 08/23/23   Akula, Vijaya, MD  buprenorphine  (BUTRANS ) 7.5 MCG/HR Place 1 patch onto the skin once a week. 02/01/24   Debby Fidela CROME, NP  cyclobenzaprine  (FLEXERIL ) 5 MG tablet Take 1 tablet (5 mg total) by mouth at bedtime. 08/26/23   Love, Sharlet RAMAN, PA-C  dapagliflozin  propanediol (FARXIGA ) 5 MG TABS tablet TAKE 1 TABLET BY MOUTH DAILY 02/06/24   Early, Sara E, NP  dorzolamide-timolol  (COSOPT) 2-0.5 % ophthalmic solution SMARTSIG:In Eye(s) 02/20/24   [provider]  DULoxetine  (CYMBALTA ) 20 MG capsule TAKE 1 CAPSULE BY MOUTH DAILY 09/01/23   Raulkar, Sven SQUIBB, MD  eszopiclone  (LUNESTA ) 1 MG TABS  tablet TAKE 1 TABLET BY MOUTH  IMMEDIATELY BEFORE BEDTIME AS  NEEDED FOR SLEEP 02/06/24   Early, Sara E, NP  Evolocumab  (REPATHA ) 140 MG/ML SOSY Inject 140mg  into the abdomen or upper thigh once every 14 days. 10/24/23   Early, Sara E, NP  fluticasone  (FLONASE ) 50 MCG/ACT nasal spray Place 1 spray into both nostrils daily as needed for allergies.    [provider]  gatifloxacin  (ZYMAXID ) 0.5 % SOLN Place 1 drop into the right eye 4 (four) times daily. Patient not taking: Reported on 02/27/2024 02/13/24   Horton, Kristie M, DO  hydrocortisone  (ANUSOL -HC) 2.5 % rectal cream PLACE 1 APPLICATION RECTALLY 2 (TWO) TIMES DAILY. 02/03/22   Theophilus Andrews, Tully GRADE, MD  ketoconazole  (NIZORAL ) 2 % cream Apply 1 Application topically daily. Apply to rash twice a day. If no improvement after 2 weeks, please let provider know. 07/23/22   Early, Sara E, NP  loratadine  (CLARITIN ) 10 MG tablet Take 10 mg by mouth daily as needed for allergies.    [provider]  losartan  (COZAAR ) 100 MG tablet Take 1 tablet (100 mg total) by mouth daily. 11/16/23   Early, Sara E, NP  mometasone  (ELOCON ) 0.1 % cream Apply very thin layer to rash at bedtime. May use one other time during the day if needed. Stop after 7 days, if still needed may repeat after 3  day break. 07/23/22   Early, Sara E, NP  polyethylene glycol (MIRALAX  / GLYCOLAX ) 17 g packet Take 17 g by mouth daily. 08/24/23   Akula, Vijaya, MD  pregabalin  (LYRICA ) 50 MG capsule Take 1 capsule (50 mg total) by mouth 2 (two) times daily. 01/27/24   Early, Sara E, NP  propranolol  (INDERAL ) 10 MG tablet Take 1 tablet (10 mg total) by mouth daily. 10/05/23   Raulkar, Sven SQUIBB, MD  senna-docusate (SENOKOT-S) 8.6-50 MG tablet Take 2 tablets by mouth 2 (two) times daily. 08/25/23   Love, Sharlet RAMAN, PA-C  tirzepatide  (ZEPBOUND ) 2.5 MG/0.5ML Pen Inject 2.5 mg into the skin once a week. Increase to 5mg  after 4 weeks if tolerating well. 02/27/24   Early, Sara E, NP  tirzepatide   (ZEPBOUND ) 5 MG/0.5ML Pen Inject 5 mg into the skin once a week. Use for 4 weeks then increase to 7.5mg  dose 03/26/24   Early, Sara E, NP  tirzepatide  (ZEPBOUND ) 7.5 MG/0.5ML Pen Inject 7.5 mg into the skin once a week. Use for 4 weeks then increase to 10mg  dose if tolerating. 04/23/24   Early, Sara E, NP  valACYclovir (VALTREX) 1000 MG tablet Take 1,000 mg by mouth 2 (two) times daily. 02/20/24   [provider]  Vitamin D , Ergocalciferol , (DRISDOL ) 1.25 MG (50000 UNIT) CAPS capsule TAKE 1 CAPSULE (50,000 UNITS TOTAL) BY MOUTH EVERY 7 (SEVEN) DAYS 11/17/23   Raulkar, Sven SQUIBB, MD    Allergies: Biaxin [clarithromycin], Ceftin [cefuroxime], Nsaids, Lortab [hydrocodone -acetaminophen ], Methocarbamol , Percocet [oxycodone -acetaminophen ], Thiazide-type diuretics, Toradol  [ketorolac  tromethamine ], and Ultram  [tramadol ]    Review of Systems  Updated Vital Signs BP (!) 151/67 (BP Location: Left Arm)   Pulse 99   Temp 98.2 F (36.8 C)   Resp 18   SpO2 97%   Physical Exam Vitals and nursing note reviewed.  HENT:     Head: Normocephalic and atraumatic.     Nose: Nose normal.  Eyes:     Comments: Significant pain and swelling of the left eye with extensive chemosis, fixed left pupil, surrounding erythema and swelling of the eyelid with tenderness and tearing, photophobia present.  Neurological:     Mental Status: She is alert.       (all labs ordered are listed, but only abnormal results are displayed) Labs Reviewed  COMPREHENSIVE METABOLIC PANEL WITH GFR - Abnormal; Notable for the following components:      Result Value   CO2 20 (*)    Glucose, Bld 115 (*)    Creatinine, Ser 1.56 (*)    Total Protein 8.2 (*)    ALT 46 (*)    Alkaline Phosphatase 257 (*)    GFR, Estimated 35 (*)    All other components within normal limits  CBC WITH DIFFERENTIAL/PLATELET - Abnormal; Notable for the following components:   Hemoglobin 11.8 (*)    HCT 34.8 (*)    Platelets 456 (*)    All other  components within normal limits  URINALYSIS, W/ REFLEX TO CULTURE (INFECTION SUSPECTED)    EKG: None  Radiology: No results found.   Procedures   Medications Ordered in the ED  iohexol  (OMNIPAQUE ) 300 MG/ML solution 75 mL (75 mLs Intravenous Contrast Given 03/04/24 1719)    Clinical Course as of 03/04/24 1938  Sun Mar 04, 2024  1938 Pressure OS: 39 mm/Hg [AH]    Clinical Course User Index [AH] Arloa Chroman, PA-C  Medical Decision Making Amount and/or Complexity of Data Reviewed Labs: ordered.   Patient here with severe swelling and pain in the left eye. Concern for potential and orbital or postseptal cellulitis, potential keratoconjunctivitis. Labs reviewed and are reassuring.  CT is pending.  Signed out to Dr. Fabiola at shift change.     Final diagnoses:  None    ED Discharge Orders     None          Arloa Chroman, PA-C 03/04/24 1920    Jerrol Agent, MD 03/05/24 [phone]

## 2024-03-04 NOTE — Progress Notes (Signed)
 ED Pharmacy Antibiotic Sign Off An antibiotic consult was received from an ED provider for zosyn  + vancomycin  per pharmacy dosing for cellulitis. A chart review was completed to assess appropriateness.   The following one time order(s) were placed:  Zosyn  3.375g Vancomycin  2g  Further antibiotic and/or antibiotic pharmacy consults should be ordered by the admitting provider if indicated.   Thank you for allowing pharmacy to be a part of this patient's care.   Leonor GORMAN Bash, [facility]  Clinical Pharmacist 03/04/2024 7:24 PM

## 2024-03-04 NOTE — ED Notes (Signed)
 Pharmacy notified that eyedrops are needed stat

## 2024-03-04 NOTE — ED Triage Notes (Signed)
 Reports left eye swelling since Monday. Recent admitted at other facility for Shingles on optic nerve in R eye 2 weeks ago. Took course of antivirals   Seen optho yesterday and told eye was dry . States swelling became worse since yesterday.

## 2024-03-04 NOTE — ED Notes (Signed)
 [facility] is 10 min out and courier just left [facility] with eye drops.

## 2024-03-04 NOTE — ED Notes (Signed)
 [facility] is sending needed eye gtts by courier.  Await arrival, pt notified.

## 2024-03-04 NOTE — Discharge Instructions (Addendum)
 You are being emergently transferred to [facility] due to concern for infection of the left eye with vision loss

## 2024-03-04 NOTE — Progress Notes (Signed)
 Pharmacy Antibiotic Note  Siyah Mault is a 73 y.o. 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  Pharmacy has been consulted for acyclovir  dosing.  Plan: Acyclovir  10 mg/kg IV q 12h (adjusted BW) IVF NS @125  Monitor renal function, clinical progression to transition to PO     Temp (24hrs), Avg:98.2 F (36.8 C), Min:98.2 F (36.8 C), Max:98.2 F (36.8 C)  Recent Labs  Lab 03/04/24 1659  WBC 9.1  CREATININE 1.56*    Estimated Creatinine Clearance: 35.5 mL/min (A) (by C-G formula based on SCr of 1.56 mg/dL (H)).    Allergies[1]  Dorn Poot, PharmD, [facility] Clinical Pharmacist ED Pharmacist Phone # [phone] 03/04/2024 8:26 PM      [1]  Allergies Allergen Reactions   Biaxin [Clarithromycin] Hives and Rash   Ceftin [Cefuroxime] Hives and Rash   Nsaids Nausea And Vomiting   Lortab [Hydrocodone -Acetaminophen ] Other (See Comments)    Stomach pain   Methocarbamol  Nausea And Vomiting and Other (See Comments)    Stomach pain  It tears my stomach up.    Stomach pain   Percocet [Oxycodone -Acetaminophen ] Other (See Comments)    Hallucination   Thiazide-Type Diuretics Other (See Comments)    Gout flair   Toradol  [Ketorolac  Tromethamine ] Nausea And Vomiting   Ultram  [Tramadol ] Nausea And Vomiting and Other (See Comments)    Stomach pain

## 2024-03-04 NOTE — ED Provider Notes (Addendum)
 Physical Exam  BP (!) 151/67 (BP Location: Left Arm)   Pulse 99   Temp 98.2 F (36.8 C)   Resp 18   SpO2 97%   Physical Exam  Procedures  .Critical Care  Performed by: Jerrol Agent, MD Authorized by: Jerrol Agent, MD   Critical care provider statement:    Critical care time (minutes):  124   Critical care was time spent personally by me on the following activities:  Development of treatment plan with patient or surrogate, discussions with consultants, evaluation of patient's response to treatment, examination of patient, ordering and review of laboratory studies, ordering and review of radiographic studies, ordering and performing treatments and interventions, pulse oximetry, re-evaluation of patient's condition and review of old charts   Care discussed with: accepting provider at another facility     ED Course / MDM   Clinical Course as of 03/04/24 2020  Sun Mar 04, 2024  1938 Pressure OS: 39 mm/Hg [AH]    Clinical Course User Index [AH] Arloa Chroman, PA-C   Medical Decision Making Amount and/or Complexity of Data Reviewed Labs: ordered. Radiology: ordered.  Risk Prescription drug management.   Care assumed at 1900 from apical Aneth PA.  Patient presenting with concern for periorbital and orbital cellulitis.  Had recent diagnosis of shingles of the eye 2 weeks ago and was treated inpatient with foscarnet and ganciclovir at [facility].  Had followed up outpatient with ophthalmology yesterday and has had worsening acute swelling of the left eye over that time, had been having eye pain over the past week.  On exam, the patient has what appears to be a severe endophthalmitis with concern for periorbital cellulitis.  Patient endorsing pain with ocular ocular movements, decreased vision changes.  Her pupil is sluggish on room evaluation on reaction to light.  CT imaging shows evidence of inflammation periorbitally and postseptally.  Will cover the patient with IV  vancomycin  and Zosyn .  Will discuss with ophthalmology need for IV ganciclovir in the setting of patient being on outpatient valacyclovir.  CT Orbits: IMPRESSION:  1. New preseptal and postseptal inflammation involving the left orbit including  diffuse scleral thickening, potentially infectious in etiology. The overall  appearance of the left orbit is now very similar to that of the right orbit on  the 02/13/2024 CT, with the right-sided findings having improved in the  interim.   Right eye intraocular pressure which is unaffected was 14.  Left eye intraocular pressure was found to be greater than 40.  Initially discussed with on-call ophthalmology, Dr. Waylan who recommended emergent transfer to [facility].  [facility] emergently contacted for transfer. Discussed with pharmacy initiation of IV acyclovir  given the patient's recent history and admission and worsening symptoms despite outpatient Valtrex. Pt visual acuity in the left eye was 0 (could not discern light), visual acuity in the right eye was 20/50 in the R eye.  Dr. Loy from opthalmology, who accepts the patient in ER to ER transfer.  EMTALA completed.  Discussed with [facility] ophthalmology, recommended that the patient be administered 1 drop timolol  every 15 minutes for 3 doses, 1 drop Bimonidine every 15 minutes for 3 doses and 1 drop latanoprost  every 15 minutes for 3 doses.  Pharmacy coordinated emergent courier for those medications as only timolol  was available immediately in the [facility]. Unfortunately [facility] arrived for transport prior to the Brimonidine  or Latanoprost  arriving by courier however the patient did receive Timolol  prior to transfer.  Jerrol Agent, MD 03/04/24 2206

## 2024-03-05 ENCOUNTER — Encounter: Attending: Registered Nurse | Admitting: Physical Medicine and Rehabilitation

## 2024-03-05 DIAGNOSIS — M5416 Radiculopathy, lumbar region: Secondary | ICD-10-CM | POA: Insufficient documentation

## 2024-03-06 ENCOUNTER — Encounter: Admitting: Physical Medicine & Rehabilitation

## 2024-03-06 ENCOUNTER — Other Ambulatory Visit: Payer: Self-pay | Admitting: Physician Assistant

## 2024-03-06 ENCOUNTER — Telehealth: Payer: Self-pay | Admitting: Physician Assistant

## 2024-03-06 MED ORDER — ONDANSETRON HCL 4 MG PO TABS: 4.0000 mg | ORAL_TABLET | Freq: Three times a day (TID) | ORAL | 0 refills | Status: AC | PRN

## 2024-03-06 MED ORDER — DOCUSATE SODIUM 100 MG PO CAPS: 100.0000 mg | ORAL_CAPSULE | Freq: Every day | ORAL | 2 refills | Status: AC | PRN

## 2024-03-06 MED ORDER — TIZANIDINE HCL 4 MG PO TABS: 4.0000 mg | ORAL_TABLET | Freq: Three times a day (TID) | ORAL | 1 refills | Status: AC

## 2024-03-06 MED ORDER — ASPIRIN 81 MG PO CHEW: 81.0000 mg | CHEWABLE_TABLET | Freq: Two times a day (BID) | ORAL | 0 refills | Status: DC

## 2024-03-06 NOTE — Telephone Encounter (Signed)
 Can you let patient know I am calling in post-op medicine and ask what pain meds she can tolerate?  Looks like she does not tolerate norco or percocet.  I tried calling without an answer a few min ago

## 2024-03-07 ENCOUNTER — Encounter (HOSPITAL_COMMUNITY): Payer: Self-pay | Admitting: Medical

## 2024-03-07 ENCOUNTER — Encounter (HOSPITAL_COMMUNITY)
Admission: RE | Admit: 2024-03-07 | Discharge: 2024-03-07 | Disposition: A | Discharge status: Home / Self Care | Source: Ambulatory Visit | Attending: Orthopaedic Surgery | Admitting: Orthopaedic Surgery

## 2024-03-07 DIAGNOSIS — Z01818 Encounter for other preprocedural examination: Secondary | ICD-10-CM

## 2024-03-07 DIAGNOSIS — I10 Essential (primary) hypertension: Secondary | ICD-10-CM

## 2024-03-07 NOTE — Telephone Encounter (Signed)
 Patient is in the hospital for eye infection and needs to cancel her surgery... Would like a call back from Dr. Jerri.

## 2024-03-07 NOTE — Progress Notes (Signed)
 Patient canceled preop appointment due to being in the hospital with an eye infection.  Patient states that she is also canceling surgery and has notified the surgeon's office.

## 2024-03-09 ENCOUNTER — Other Ambulatory Visit: Payer: Self-pay | Admitting: Physical Medicine and Rehabilitation

## 2024-03-09 ENCOUNTER — Encounter: Payer: Self-pay | Admitting: Physical Medicine and Rehabilitation

## 2024-03-09 DIAGNOSIS — M961 Postlaminectomy syndrome, not elsewhere classified: Secondary | ICD-10-CM

## 2024-03-09 DIAGNOSIS — G8929 Other chronic pain: Secondary | ICD-10-CM

## 2024-03-09 DIAGNOSIS — M47817 Spondylosis without myelopathy or radiculopathy, lumbosacral region: Secondary | ICD-10-CM

## 2024-03-09 MED ORDER — BUPRENORPHINE 7.5 MCG/HR TD PTWK: 1.0000 | MEDICATED_PATCH | TRANSDERMAL | 2 refills | Status: AC

## 2024-03-16 ENCOUNTER — Ambulatory Visit: Admit: 2024-03-16 | Admitting: Orthopaedic Surgery

## 2024-03-16 DIAGNOSIS — M1711 Unilateral primary osteoarthritis, right knee: Secondary | ICD-10-CM

## 2024-03-16 SURGERY — ARTHROPLASTY, KNEE, TOTAL
Anesthesia: Spinal | Site: Knee | Laterality: Right

## 2024-03-19 ENCOUNTER — Telehealth: Payer: Self-pay

## 2024-03-19 ENCOUNTER — Encounter: Payer: Self-pay | Admitting: Nurse Practitioner

## 2024-03-19 NOTE — Telephone Encounter (Signed)
 Alert remote transmission: AF 1 AF event.  Per website, x 8 min and text only.  First AF event logged.  CVA indication.  To triage for awareness.  Follow up as scheduled. [place], CVRS   Reviewed with [employer], [employer], They were able to review the event and confirmed that their AI algorithm determined that this was a FALSE AF event.  Device sent as text only and not with an EGM due to lack of confirmation of atrial fibrillation.  After initial flag of AF, during the 1 hour processing delay, AI determined this was not AF and therefore sent no further information.  Nothing further to do will continue to monitor.

## 2024-03-27 ENCOUNTER — Ambulatory Visit

## 2024-03-27 DIAGNOSIS — R002 Palpitations: Secondary | ICD-10-CM | POA: Diagnosis not present

## 2024-03-27 LAB — CUP PACEART REMOTE DEVICE CHECK
Date Time Interrogation Session: 20260105233917
Implantable Pulse Generator Implant Date: 20250603

## 2024-03-30 ENCOUNTER — Encounter: Admitting: Physician Assistant

## 2024-03-30 NOTE — Progress Notes (Signed)
 Remote Loop Recorder Transmission

## 2024-03-31 ENCOUNTER — Ambulatory Visit: Payer: Self-pay | Admitting: Cardiovascular Disease

## 2024-04-05 ENCOUNTER — Other Ambulatory Visit (HOSPITAL_COMMUNITY): Payer: Self-pay

## 2024-04-05 ENCOUNTER — Telehealth: Payer: Self-pay | Admitting: Pharmacy Technician

## 2024-04-05 NOTE — Telephone Encounter (Signed)
 Pharmacy Patient Advocate Encounter   Received notification from [employer] C[employer] KEY that prior authorization for Wegovy  0.25MG /0.5ML auto-injectors is due for renewal.   Insurance verification completed.   The patient is insured through [employer].  Action: Medication has been discontinued. Archived Key: [id number]  Patient now on Zepbound .

## 2024-04-10 ENCOUNTER — Ambulatory Visit: Admitting: Emergency Medicine

## 2024-04-27 ENCOUNTER — Encounter

## 2024-04-27 ENCOUNTER — Other Ambulatory Visit: Payer: Self-pay | Admitting: Physical Medicine and Rehabilitation

## 2024-04-27 LAB — CUP PACEART REMOTE DEVICE CHECK
Date Time Interrogation Session: 20260205233212
Implantable Pulse Generator Implant Date: 20250603

## 2024-04-30 ENCOUNTER — Ambulatory Visit: Payer: Medicare Other | Admitting: Nurse Practitioner

## 2024-05-10 ENCOUNTER — Ambulatory Visit: Admitting: Emergency Medicine

## 2024-05-22 ENCOUNTER — Encounter: Admitting: Physical Medicine and Rehabilitation

## 2024-05-28 ENCOUNTER — Ambulatory Visit

## 2024-06-28 ENCOUNTER — Ambulatory Visit

## 2024-07-29 ENCOUNTER — Ambulatory Visit

## 2024-08-29 ENCOUNTER — Ambulatory Visit

## 2024-09-24 ENCOUNTER — Ambulatory Visit: Admitting: Family Medicine

## 2024-09-27 ENCOUNTER — Ambulatory Visit: Admitting: Family Medicine

## 2024-09-29 ENCOUNTER — Ambulatory Visit

## 2024-10-30 ENCOUNTER — Ambulatory Visit

## 2024-11-30 ENCOUNTER — Ambulatory Visit

## 2024-12-31 ENCOUNTER — Ambulatory Visit

## 2025-01-31 ENCOUNTER — Ambulatory Visit

## 2025-03-03 ENCOUNTER — Ambulatory Visit

## 2025-04-03 ENCOUNTER — Ambulatory Visit
